# Patient Record
Sex: Male | Born: 1950
Health system: Southern US, Community
[De-identification: ages and names within clinical notes are randomized; demographics above are authoritative.]

## PROBLEM LIST (undated history)

## (undated) DIAGNOSIS — E66812 Obesity, class 2: Secondary | ICD-10-CM

## (undated) DIAGNOSIS — I2510 Atherosclerotic heart disease of native coronary artery without angina pectoris: Secondary | ICD-10-CM

## (undated) DIAGNOSIS — I10 Essential (primary) hypertension: Secondary | ICD-10-CM

## (undated) DIAGNOSIS — K5790 Diverticulosis of intestine, part unspecified, without perforation or abscess without bleeding: Secondary | ICD-10-CM

## (undated) DIAGNOSIS — E669 Obesity, unspecified: Secondary | ICD-10-CM

## (undated) DIAGNOSIS — D126 Benign neoplasm of colon, unspecified: Secondary | ICD-10-CM

## (undated) DIAGNOSIS — C61 Malignant neoplasm of prostate: Secondary | ICD-10-CM

## (undated) DIAGNOSIS — M1990 Unspecified osteoarthritis, unspecified site: Secondary | ICD-10-CM

## (undated) DIAGNOSIS — M797 Fibromyalgia: Secondary | ICD-10-CM

## (undated) DIAGNOSIS — H269 Unspecified cataract: Secondary | ICD-10-CM

## (undated) DIAGNOSIS — E785 Hyperlipidemia, unspecified: Secondary | ICD-10-CM

## (undated) DIAGNOSIS — K648 Other hemorrhoids: Secondary | ICD-10-CM

## (undated) DIAGNOSIS — K219 Gastro-esophageal reflux disease without esophagitis: Secondary | ICD-10-CM

## (undated) DIAGNOSIS — F419 Anxiety disorder, unspecified: Secondary | ICD-10-CM

## (undated) DIAGNOSIS — R011 Cardiac murmur, unspecified: Secondary | ICD-10-CM

## (undated) DIAGNOSIS — Z9889 Other specified postprocedural states: Secondary | ICD-10-CM

## (undated) DIAGNOSIS — Z9861 Coronary angioplasty status: Secondary | ICD-10-CM

## (undated) DIAGNOSIS — R112 Nausea with vomiting, unspecified: Secondary | ICD-10-CM

## (undated) DIAGNOSIS — E119 Type 2 diabetes mellitus without complications: Secondary | ICD-10-CM

## (undated) HISTORY — DX: Coronary angioplasty status: Z98.61

## (undated) HISTORY — PX: HEEL SPUR SURGERY: SHX665

## (undated) HISTORY — DX: Other hemorrhoids: K64.8

## (undated) HISTORY — DX: Gastro-esophageal reflux disease without esophagitis: K21.9

## (undated) HISTORY — DX: Essential (primary) hypertension: I10

## (undated) HISTORY — DX: Diverticulosis of intestine, part unspecified, without perforation or abscess without bleeding: K57.90

## (undated) HISTORY — DX: Obesity, class 2: E66.812

## (undated) HISTORY — DX: Hyperlipidemia, unspecified: E78.5

## (undated) HISTORY — PX: POLYPECTOMY: SHX149

## (undated) HISTORY — DX: Atherosclerotic heart disease of native coronary artery without angina pectoris: I25.10

## (undated) HISTORY — PX: HEMORRHOID SURGERY: SHX153

## (undated) HISTORY — DX: Anxiety disorder, unspecified: F41.9

## (undated) HISTORY — PX: COLONOSCOPY: SHX174

## (undated) HISTORY — DX: Cardiac murmur, unspecified: R01.1

## (undated) HISTORY — DX: Unspecified cataract: H26.9

## (undated) HISTORY — DX: Obesity, unspecified: E66.9

## (undated) HISTORY — PX: FOOT SURGERY: SHX648

## (undated) HISTORY — DX: Unspecified osteoarthritis, unspecified site: M19.90

## (undated) HISTORY — DX: Benign neoplasm of colon, unspecified: D12.6

## (undated) HISTORY — PX: JOINT REPLACEMENT: SHX530

## (undated) HISTORY — PX: TOTAL HIP ARTHROPLASTY: SHX124

## (undated) HISTORY — DX: Fibromyalgia: M79.7

## (undated) HISTORY — PX: OTHER SURGICAL HISTORY: SHX169

---

## 2002-05-27 ENCOUNTER — Encounter: Payer: Self-pay | Admitting: Family Medicine

## 2002-05-27 ENCOUNTER — Encounter: Payer: Self-pay | Admitting: Emergency Medicine

## 2002-05-27 ENCOUNTER — Encounter: Payer: Self-pay | Admitting: Cardiology

## 2002-05-27 ENCOUNTER — Inpatient Hospital Stay (HOSPITAL_COMMUNITY): Admission: EM | Admit: 2002-05-27 | Discharge: 2002-05-28 | Payer: Self-pay | Admitting: Emergency Medicine

## 2002-05-27 HISTORY — PX: DOPPLER ECHOCARDIOGRAPHY: SHX263

## 2002-05-28 ENCOUNTER — Encounter: Payer: Self-pay | Admitting: Internal Medicine

## 2002-07-05 ENCOUNTER — Encounter: Admission: RE | Admit: 2002-07-05 | Discharge: 2002-07-05 | Payer: Self-pay | Admitting: Infectious Diseases

## 2004-08-13 ENCOUNTER — Ambulatory Visit: Payer: Self-pay | Admitting: Family Medicine

## 2004-12-19 ENCOUNTER — Ambulatory Visit: Payer: Self-pay | Admitting: Family Medicine

## 2005-03-05 ENCOUNTER — Ambulatory Visit: Payer: Self-pay | Admitting: Family Medicine

## 2005-03-12 ENCOUNTER — Ambulatory Visit: Payer: Self-pay | Admitting: Family Medicine

## 2005-03-28 ENCOUNTER — Ambulatory Visit: Payer: Self-pay | Admitting: Gastroenterology

## 2005-03-29 DIAGNOSIS — D126 Benign neoplasm of colon, unspecified: Secondary | ICD-10-CM

## 2005-03-29 HISTORY — DX: Benign neoplasm of colon, unspecified: D12.6

## 2005-04-04 ENCOUNTER — Ambulatory Visit: Payer: Self-pay | Admitting: Family Medicine

## 2005-04-19 ENCOUNTER — Encounter (INDEPENDENT_AMBULATORY_CARE_PROVIDER_SITE_OTHER): Payer: Self-pay | Admitting: Specialist

## 2005-04-19 ENCOUNTER — Ambulatory Visit: Payer: Self-pay | Admitting: Gastroenterology

## 2005-05-23 ENCOUNTER — Ambulatory Visit (HOSPITAL_COMMUNITY): Admission: RE | Admit: 2005-05-23 | Discharge: 2005-05-23 | Payer: Self-pay | Admitting: Orthopedic Surgery

## 2005-07-02 ENCOUNTER — Ambulatory Visit: Payer: Self-pay | Admitting: Family Medicine

## 2005-08-06 ENCOUNTER — Ambulatory Visit: Payer: Self-pay | Admitting: Family Medicine

## 2005-08-07 ENCOUNTER — Encounter: Admission: RE | Admit: 2005-08-07 | Discharge: 2005-08-07 | Payer: Self-pay | Admitting: Family Medicine

## 2006-04-22 ENCOUNTER — Ambulatory Visit: Payer: Self-pay | Admitting: Family Medicine

## 2006-05-09 ENCOUNTER — Ambulatory Visit: Payer: Self-pay | Admitting: Family Medicine

## 2006-07-29 DIAGNOSIS — Z9861 Coronary angioplasty status: Secondary | ICD-10-CM

## 2006-07-29 DIAGNOSIS — I2510 Atherosclerotic heart disease of native coronary artery without angina pectoris: Secondary | ICD-10-CM

## 2006-07-29 HISTORY — DX: Atherosclerotic heart disease of native coronary artery without angina pectoris: I25.10

## 2006-07-29 HISTORY — DX: Atherosclerotic heart disease of native coronary artery without angina pectoris: Z98.61

## 2006-10-27 ENCOUNTER — Inpatient Hospital Stay (HOSPITAL_COMMUNITY): Admission: RE | Admit: 2006-10-27 | Discharge: 2006-10-30 | Payer: Self-pay | Admitting: Orthopedic Surgery

## 2006-11-02 ENCOUNTER — Emergency Department (HOSPITAL_COMMUNITY): Admission: EM | Admit: 2006-11-02 | Discharge: 2006-11-02 | Payer: Self-pay | Admitting: Emergency Medicine

## 2006-11-03 ENCOUNTER — Emergency Department (HOSPITAL_COMMUNITY): Admission: EM | Admit: 2006-11-03 | Discharge: 2006-11-04 | Payer: Self-pay | Admitting: Emergency Medicine

## 2007-02-24 ENCOUNTER — Ambulatory Visit: Payer: Self-pay | Admitting: Family Medicine

## 2007-02-24 DIAGNOSIS — M159 Polyosteoarthritis, unspecified: Secondary | ICD-10-CM | POA: Insufficient documentation

## 2007-02-24 DIAGNOSIS — F411 Generalized anxiety disorder: Secondary | ICD-10-CM | POA: Insufficient documentation

## 2007-02-24 DIAGNOSIS — I10 Essential (primary) hypertension: Secondary | ICD-10-CM

## 2007-02-24 DIAGNOSIS — I152 Hypertension secondary to endocrine disorders: Secondary | ICD-10-CM | POA: Insufficient documentation

## 2007-02-24 DIAGNOSIS — K219 Gastro-esophageal reflux disease without esophagitis: Secondary | ICD-10-CM

## 2007-02-24 DIAGNOSIS — R079 Chest pain, unspecified: Secondary | ICD-10-CM

## 2007-02-24 DIAGNOSIS — F418 Other specified anxiety disorders: Secondary | ICD-10-CM | POA: Insufficient documentation

## 2007-02-26 ENCOUNTER — Ambulatory Visit: Payer: Self-pay | Admitting: Internal Medicine

## 2007-02-26 ENCOUNTER — Emergency Department (HOSPITAL_COMMUNITY): Admission: EM | Admit: 2007-02-26 | Discharge: 2007-02-26 | Payer: Self-pay | Admitting: Emergency Medicine

## 2007-03-03 ENCOUNTER — Telehealth: Payer: Self-pay | Admitting: Family Medicine

## 2007-03-04 ENCOUNTER — Telehealth: Payer: Self-pay | Admitting: Family Medicine

## 2007-04-07 HISTORY — PX: OTHER SURGICAL HISTORY: SHX169

## 2007-04-08 HISTORY — PX: OTHER SURGICAL HISTORY: SHX169

## 2007-04-30 ENCOUNTER — Ambulatory Visit (HOSPITAL_COMMUNITY): Admission: RE | Admit: 2007-04-30 | Discharge: 2007-05-01 | Payer: Self-pay | Admitting: *Deleted

## 2007-04-30 HISTORY — PX: CORONARY ANGIOPLASTY WITH STENT PLACEMENT: SHX49

## 2007-10-13 ENCOUNTER — Telehealth (INDEPENDENT_AMBULATORY_CARE_PROVIDER_SITE_OTHER): Payer: Self-pay | Admitting: *Deleted

## 2010-03-09 ENCOUNTER — Encounter (INDEPENDENT_AMBULATORY_CARE_PROVIDER_SITE_OTHER): Payer: Self-pay | Admitting: *Deleted

## 2010-08-19 ENCOUNTER — Encounter: Payer: Self-pay | Admitting: Family Medicine

## 2010-08-28 NOTE — Letter (Signed)
Summary: Colonoscopy Letter  [facility]  [address] [place], [place] [address]   Phone: [phone]  Fax: [phone]      March 09, 2010 MRN: [id number]   Samuel Chambers [address] [place], [place]  [address]   Dear Mr. LEITZEL,   According to your medical record, it is time for you to schedule a Colonoscopy. The American Cancer Society recommends this procedure as a method to detect early colon cancer. Patients with a family history of colon cancer, or a personal history of colon polyps or inflammatory bowel disease are at increased risk.  This letter has beeen generated based on the recommendations made at the time of your procedure. If you feel that in your particular situation this may no longer apply, please contact our office.  Please call our office at [phone] to schedule this appointment or to update your records at your earliest convenience.  Thank you for cooperating with [place] to provide you with the very best care possible.   Sincerely,  Judie Petit T. Russella Dar, M.D.  [facility] [phone]

## 2010-12-11 NOTE — Consult Note (Signed)
NAME:  Samuel Chambers, Samuel Chambers NO.:  [id number]   MEDICAL RECORD NO.:  [id number]          PATIENT TYPE:  EMS   LOCATION:  MAJO                         FACILITY:  [facility]   PHYSICIAN:  Raenette Rover. Felicity Coyer, MDDATE OF BIRTH:  05-Sep-1950   DATE OF CONSULTATION:  02/26/2007  DATE OF DISCHARGE:  02/26/2007                                 CONSULTATION   REQUESTING PHYSICIAN:  ER doctor, Quita Skye, MD.   CHIEF COMPLAINT:  Dizziness with chest tightness.   HISTORY OF PRESENT ILLNESS:  The patient is a 60 year old white  gentleman with history of orthopedic problems including tendinitis and  arthritis, status post left total hip replacement arthroplasty in March  2008, who presents to [facility] today for multiple  complaints.  He has had chest tightness with radiation into the left arm  associated with diaphoresis for the last 2 weeks.  He attributes the  onset of these symptoms to be beginning prednisone taper prescribed by  Dr. Lequita Halt for hip bursitis.  He took these medications over the course  of the prescribed week with relative resolution of his hip symptoms, but  has experienced onset of high blood pressure with the prednisone use.  He has had diagnosis of borderline hypertension prior to this time and  on evaluation by his primary care physician, Dr. Shellia Carwin, on Tuesday,  when his blood pressure was found to be elevated, he was given a  prescription for clonidine 0.3 mg b.i.d.  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  Friends at work noted his skin to be flushed as if  sunburned.  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  He had blurring of  vision, unable to read, seeing double, called his wife and thereby came  to the emergency room for evaluation.  At this time in the emergency  room, he noted to be normo to slightly hypotensive and reports that he  feels he has been run over by a truck, but denies specific change in his  chest pain and no dizziness at rest, no nausea or vomiting.   PAST MEDICAL HISTORY:  1. Borderline hypertension.  2. Question of dyslipidemia (the patient reports his cholesterol was      130 and has not been on cholesterol medications due to adverse side-      effects).  3. He is status post total left hip replacement in March of this year.  4. He has a history of reflux for which he takes p.r.n. treatment.  5. History of anxiety.   MEDICATIONS:  1. Clonidine 0.3 mg b.i.d. for the last 48 hours.  2. Nexium 40 mg daily p.r.n.  3. Percocet 5 mg p.r.n.  4. Celebrex 100 mg p.o. daily, first dose this morning.  5. Aspirin 81 mg daily for the last 2 days, otherwise p.r.n.  6. Robaxin 500 mg p.r.n.  7. Ibuprofen 800 mg p.r.n.  8. Aleve p.r.n.   ALLERGIES:  Intolerance to CODEINE and COUMADIN.   FAMILY HISTORY:  His mother died at age 38 due to dementia and she did  have diet-controlled diabetes.  His father is still living at age 45,  but has had history of lung cancer and prostate trouble.  He reports  that a brother had a bypass surgery in his 60s, but did not take care of  himself and had uncontrolled diabetes.   SOCIAL HISTORY:  He works as a Curator.  He is married.  He does not  smoke, has rare alcohol.   REVIEW OF SYSTEMS:  Negative for fever or chills.  No shortness of  breath or lower extremity swelling.  No pleurisy.  No headache.  No neck  tightness.  No nausea or vomiting with the increase in reflux symptoms.  No abdominal pain.   PHYSICAL EXAM:  VITAL SIGNS:   Temperature of 98.5, blood pressure  112/75, as low as 90 systolic in the emergency room, pulse of 70, normal  sinus on the monitor without arrhythmias, respiratory rate 20,  saturating 96% on room air.  GENERAL:  He is a slightly overweight, but pleasant and minimally  anxious man in no acute distress.  His wife is at bedside.  He is awake,  alert and oriented x4 without gross deficits.  HEENT:  Normocephalic, atraumatic.  PERRL.  EOMI.  Oropharynx clear.  NECK:  Supple, but thick.  No appreciable JVD.  LUNGS:  Clear to auscultation bilaterally anteriorly without wheeze or  crackle.  CARDIOVASCULAR:  Regular rate and rhythm with a sharp S1 and S2, no  murmurs, rubs or gallops appreciated.  ABDOMEN:  Protuberant, but soft and nontender with good bowel sounds.  EXTREMITIES:  Show no edema or swelling.  NEUROLOGIC:  Cranial nerves II-XII are symmetrically intact and there  are no visual field deficits.   LABORATORY DATA:  Unremarkable with a normal CBC, normal basic metabolic  and point-of-care enzymes negative x1.   EKG is normal sinus rhythm at 59 beats per minute.   Chest x-ray shows minimal cardiomegaly changes with vascular congestion,  but no edema, no effusion, airspace disease or consolidation.   ASSESSMENT AND PLAN:  1. Dizziness due to symptomatic hypotension with orthostatic-type      changes likely due to clonidine effect.  Note:  He has taken a high      dose of this medications for the last 2 days with concurrent      symptom onset.  There is no evidence of acute coronary syndrome or      arrhythmia on the emergency room evaluation.  I have recommended an      overnight observation to the patient and his wife to monitor on      telemetry, rule out cardiac enzymes and continue to hold clonidine      to allow its effects to subside.  However, after review, the      patient will prefer discharge home to minimize exertion on his own      in the next 24 hours and will stop  clonidine for himself.  He and      his wife agree to return to the emergency room if he has increase  in his dizziness symptoms or chest pain, or other problems.  I have      recommended outpatient followup with his primary medical doctor for      a blood pressure recheck, a cholesterol check and an outpatient      stress test in the next 3-4 weeks.  He and the patient's wife have      reviewed these instructions and agreed to plan as described.  It is      notable that he has few cardiac risk, since he is not a smoker, has      no clear family history, no personal history of diabetes and      questionable history of dyslipidemia as well as only borderline      hypertension prior to this time.  2. Steroid-induced hypertension.  Now with symptomatic hypotension on      clonidine treatment.  He reports an intolerance to previous      antihypertensive medications and has only had a history of      borderline hypertension in the past with systolic pressures      ranging 140-150 by his report; this was all prior to prednisone and      his prednisone is now decreased and would expect the prednisone      effects to be minimized in the next several days to weeks.  I have      recommended to the patient that he needs close followup with his      primary medical after this effect of the steroids is gone to      reevaluate blood pressure and consider other treatment if this      remains elevated to minimize other atherosclerotic disease effects.  3. Status post left total hip replacement in March 2008.  He is also      status post a steroid taper for tendinitis in the same.  Outpatient      followup with Dr. Lequita Halt, his orthopedist, and then he is welcome      to continue Celebrex as needed for his symptoms.  4. History of gastroesophageal reflux disease symptoms.  Increased      symptoms with recent steroid use.  Would continue Nexium or other      over-the-counter proton pump inhibitor as  needed and follow up with      primary medical doctor once established.   In conclusion, I feel it is safe for discharge home for continued  outpatient observation.      Valerie A. Felicity Coyer, MD  Electronically Signed     VAL/MEDQ  D:  02/26/2007  T:  02/27/2007  Job:  [id number]

## 2010-12-11 NOTE — Discharge Summary (Signed)
NAME:  Samuel Chambers, Samuel Chambers NO.:  [id number]   MEDICAL RECORD NO.:  [id number]          PATIENT TYPE:  OIB   LOCATION:  6524                         FACILITY:  [facility]   PHYSICIAN:  Darlin Priestly, MD  DATE OF BIRTH:  [birth year]   DATE OF ADMISSION:  04/30/2007  DATE OF DISCHARGE:  05/01/2007                               DISCHARGE SUMMARY   DISCHARGE DIAGNOSES:  1. Unstable angina, catheterization and PROMUS circumflex stenting      this admission.  2. Treated hypertension.  3. Dyslipidemia.   HOSPITAL COURSE:  The patient is a 60 year old male followed by Dr.  Lajean Manes.  He has hypertension and dyslipidemia.  He had a hip  replacement on October 27, 2006.  After that, he had some exertional  dyspnea and some chest tightness and unstable blood pressure.  He had  some tachycardia and a monitor was placed as an outpatient.  He had  sinus rhythm with sinus tachycardia and PVCs.  He has had a remote  catheterization which reportedly was normal.  Initially, stress test was  ordered, although I do not have the report that this was done.  He was  admitted for catheterization on April 30, 2007.  This revealed 60-70%  mid RCA, 95% circumflex after the OM-1 and a normal LAD.  He underwent  PROMUS stenting to the circumflex with good results.  His enzymes were  negative.  We feel he can be discharged on May 01, 2007.   DISCHARGE MEDICATIONS:  1. Avapro 150 mg a day.  2. Nexium 40 mg a day.  3. Metoprolol 100 mg a day.  4. Aspirin 81 mg a day.  5. Plavix 75 mg a day.  6. Zocor 40 mg a day.  7. Nitroglycerin sublingual p.r.n.   LABORATORY DATA AND X-RAY FINDINGS:  Sodium 139, potassium 4.1, BUN 10,  creatinine 1.  CK-MB and troponin are negative.  White count 7.1,  hemoglobin 13.3, hematocrit 38.9, platelets 220.   CONDITION ON DISCHARGE:  The patient is discharged in stable condition.   FOLLOW UP:  He will follow up with Dr. Jenne Campus in a couple weeks.      Abelino Derrick, P.A.      Darlin Priestly, MD  Electronically Signed    LKK/MEDQ  D:  05/01/2007  T:  05/01/2007  Job:  [id number]

## 2010-12-11 NOTE — Discharge Summary (Signed)
NAME:  Samuel Chambers, COATS NO.:  [id number]   MEDICAL RECORD NO.:  [id number]          PATIENT TYPE:  OIB   LOCATION:  6524                         FACILITY:  [facility]   PHYSICIAN:  Darlin Priestly, MD  DATE OF BIRTH:  [birth year]   DATE OF ADMISSION:  04/30/2007  DATE OF DISCHARGE:  05/01/2007                               DISCHARGE SUMMARY   ADDENDUM:  Mr. Lesch apparently has had myalgias with Zocor in the  past.  We changed him to Crestor at discharge.      Abelino Derrick, P.A.      Darlin Priestly, MD  Electronically Signed    LKK/MEDQ  D:  05/01/2007  T:  05/01/2007  Job:  [id number]

## 2010-12-11 NOTE — Cardiovascular Report (Signed)
NAME:  CHRISTOPHERJOHN, SCHIELE NO.:  [id number]   MEDICAL RECORD NO.:  [id number]          PATIENT TYPE:  OIB   LOCATION:  6524                         FACILITY:  [facility]   PHYSICIAN:  Darlin Priestly, MD  DATE OF BIRTH:  17-Aug-1950   DATE OF PROCEDURE:  04/30/2007  DATE OF DISCHARGE:                            CARDIAC CATHETERIZATION   PROCEDURES PERFORMED:  1. Left heart catheterization.  2. Coronary angiography.  3. Left ventriculogram.  4. Abdominal aortogram.  5. PROMUS stent, left circumflex - mid  6. Placement of intracoronary stent.   CARDIOLOGIST:  Darlin Priestly, M.D.   COMPLICATIONS:  None.   INDICATIONS:  The patient is a 60 year old male, patient of Dr. Lajean Manes, with a history of hypertension and hyperlipidemia who recently  underwent left hip replacement.  Since that time his has complained of  increasing shortness of breath and chest tightness with fluctuating  blood pressures.  After a lengthy discussion with both he and his wife  we have opted for cardiac catheterization to rule out CAD.   DESCRIPTION OF THE PROCEDURE:  After obtaining written informed consent  the patient was brought to the cardiac cath lab.  The right and left  groins were shaved, prepped and draped in the usual sterile fashion.  Usual monitoring was established.  Using the modified Seldinger  technique a #6-French arterial sheath was placed in the right femoral  artery.  Six French diagnostic catheters performed diagnostic  angiography.   RESULTS:   ANGIOGRAPHIC DATA:  Left Main:  The left main is a large long vessel  with no significant disease.   LAD:  The LAD is a large vessel that courses the apex with one diagonal  branch.  The LAD has no significant disease.  The first diagonal is a  medium size vessel, which bifurcates distally with no significant  disease.   Circumflex:  The left circumflex is a medium size vessel coursing the AV  groove and gives rise  to two obtuse marginal branches.  The AV groove  circumflex is tortuous and has proximal stenosis coming off at 90  degrees ostial from the left main.  There is a 95% stenotic lesion  between the first and second OM.  The first OM is a medium size vessel,  which bifurcates distally with no significant disease.  The second OM is  a small-to-medium size vessel with no significant disease.   Right Coronary Artery:  The right coronary is a large vessel, which is  dominant and give rise to both the PDA as well as the posterolateral  branch.  There is a 60-70% midvessel stenotic lesion as well as 30%  distal RCA disease.  The PDA and the posterolateral branches are small  vessels with no significant disease.   VENTRICULOGRAPHIC DATA:  Ventriculogram:  The left ventriculogram  reveals a preserved EF of 60%.   Aortographic data:  Aortogram:  The abdominal aortogram reveals no significant renal artery  stenosis.   HEMODYNAMIC RESULTS:  Systemic arterial pressure 143/84, LV systemic  pressure 145/7 and LVEDP 17.   INTERVENTIONAL  PROCEDURE:  Left circumflex-mid:  Following diagnostic  angiography a 6-French XP 3.0 guiding catheter with side holes was used  to engage the left main.  Next a 0.014 Forte marker wire was advanced  out the guiding catheter, used to cross the mid AV groove stenotic  lesion positioned in the distal second OM.  We then took a PROMUS 2.58  mm stent and we were able to successfully cross the stenotic lesion.  The PROMUS stent was then deployed to eight atmospheres for a total 19  seconds.  A second inflation to eight atmospheres was performed for a  total of 22 seconds.  Follow up angiogram revealed excellent luminal  gain with no evidence of dissection or thrombus.  This stent balloon was  removed and a 2.5 x 8 mm Quantum Maverick was then positioned within the  previously placed stent.  One inflation to 12 atmospheres was performed  for a total of 20 seconds.  Follow  up angiogram revealed no evidence of  dissection or thrombus with TIMI III flow in the distal vessel.  Intravenous Angiomax  was used throughout the case.   Final orthogonal angiograms revealed less than 10 residual stenosis in  the AV groove circumflex stenotic occlusion with TIMI III flow  through  the distal vessel.   At this point we elected to conclude the procedure.  All balloons, wires  and catheters were removed.  Hemostatic sheaths were sewn in place and  the patient was transferred back to ward in stable condition.   CONCLUSION:  1. Successful placement of a PROMUS 2.58 mm stent in the mid AV groove      stenotic lesion.  2. Moderate right coronary artery disease.  3. Normal left ventricular systolic function.  4. No evidence for an renal artery stenosis.  5. Adjuvant use of Angiomax infusion.      Darlin Priestly, MD  Electronically Signed     RHM/MEDQ  D:  04/30/2007  T:  05/01/2007  Job:  [id number]   cc:   Oley Balm. Georgina Pillion, M.D.

## 2010-12-14 NOTE — Discharge Summary (Signed)
NAME:  Samuel Chambers, Samuel Chambers NO.:  [id number]   MEDICAL RECORD NO.:  [id number]                   PATIENT TYPE:  INP   LOCATION:  4703                                 FACILITY:  [facility]   PHYSICIAN:  Charlton Haws, M.D. [facility]              DATE OF BIRTH:  1950/10/05   DATE OF ADMISSION:  05/27/2002  DATE OF DISCHARGE:  05/28/2002                           DISCHARGE SUMMARY - REFERRING   DISCHARGE DIAGNOSES:  1. Chest pain.  2. Dyspnea on exertion.  3. Hypertension.   HISTORY OF PRESENT ILLNESS:  The patient is a 60 year old male patient who  was admitted to [facility] on May 27, 2002 with substernal chest  pain.  He had noticed cold symptoms that were threatening him again in  August of 2003 and persisted for two months.  Essentially he states that no  diagnosis was found; however, since that time he has complained of fatigue  and dyspnea on exertion which has worsened over the past week.  On admission  he described anterior substernal chest pain radiating through his back that  he describes as a knife piercing into his back.  This was worse this  morning and he came to the emergency room.  Due to the description he  underwent a chest CT to evaluate for dissection.  This essentially was  negative.  He was admitted and lab studies during his hospital stay reveal a  total CK of 415 with 4.7 MB fractions.  Troponins are negative.  His BMET  was normal.  Total cholesterol was 220, HDL 49, LDL 146.  Triglycerides 123.  CBC was normal.  D-dimer 0.30.  He was hospitalized overnight and remained  in stable condition.  He did undergo a stress Cardiolite and this was  negative for ischemia or perfusion defect, EF of 67%.  A 2-D echo revealed  normal LV systolic function with an EF of 55-65% with no wall motion  abnormalities.  For these reasons, the patient was discharged home in stable  condition on the following medicines:   MEDICATIONS:  1. Enteric-coated  aspirin 325 mg one p.o. q.d.  2. Lopressor 50 mg one half tablet twice a day.  3. Tylenol p.r.n.   ACTIVITY:  As tolerated.   DIET:  Low fat diet.    FOLLOW UP:  Call for questions or concerns.  He has a follow-up appointment  with Dr. Cato Mulligan for follow-up of his blood pressure and internal medicine  issues on June 11, 2002, 11 a.m.        Guy Franco, P.A. [facility]                      Charlton Haws, M.D. [facility]    LB/MEDQ  D:  06/19/2002  T:  06/19/2002  Job:  [id number]   cc:   Tinnie Gens A. Tawanna Cooler, M.D. [facility]   Charlies Constable, M.D. [facility]  [address]  [place]  [place] [address]

## 2010-12-14 NOTE — Discharge Summary (Signed)
NAME:  Samuel Chambers, Samuel Chambers NO.:  [id number]   MEDICAL RECORD NO.:  [id number]          PATIENT TYPE:  INP   LOCATION:  1503                         FACILITY:  [facility]   PHYSICIAN:  Ollen Gross, M.D.    DATE OF BIRTH:  1950-12-19   DATE OF ADMISSION:  10/27/2006  DATE OF DISCHARGE:  10/30/2006                               DISCHARGE SUMMARY   ADMITTING DIAGNOSES:  1. Osteoarthritis, left hip.  2. Mild anxiety.  3. Borderline hypertension.  4. Reflux disease.  5. Hemorrhoids.   DISCHARGE DIAGNOSIS:  1. Osteoarthritis, left hip, status post left total hip replacement      arthroplasty.  2. Mild postoperative blood loss anemia.  3. Mild anxiety.  4. Borderline hypertension.  5. Reflux disease.  6. Hemorrhoids.   PROCEDURE:  October 27, 2006, left total hip.  Surgeon: Dr. Lequita Halt.  Assistant:  Avel Peace PA-C.  Anesthesia: General.   CONSULTATIONS:  None.   BRIEF HISTORY:  The patient is a 60 year old male with a rapidly  progressive OA of the left hip, developed intractable pain with  diminished function ever since a total hip arthroplasty.   LABORATORY DATA:  Preop CBC showed hemoglobin of 14.9, hematocrit 42.7,  white cell count 8.5.  Postoperative hemoglobin 11.8 drifted down to  10.3.  Last hemoglobin and hematocrit back September were 10.7 and 31.0,  respectively.  PT/PTT preop 12.7 and 29, respectively.  INR 0.9.  Serial  pro times followed.  Last PT/INR 20.6 and 1.7, respectively.  Chemistry  panel on admission all within normal limits.  Serial BMETs were  followed.  Electrolytes remained within normal limits.  Preop UA  negative.  Blood group type B positive.   X-rays: Pelvis hip film October 21, 2006:  Bilateral degenerative changes  of hips, left greater than right.   Portable pelvis and hip film October 27, 2006:  Left total hip without  complication.   HOSPITAL COURSE:  The patient was admitted to [facility],  tolerated procedure well,  later transferred to the recovery room and  then the orthopedic floor.  Started on PCA and p.o. analgesics for pain  control following surgery.  Did pretty well on the evening of surgery  and on morning of day #1 started getting up out of bed. Hemovac drain  was pulled without difficulty, had excellent urinary output.  By day #2,  was progressing well with PT and got up and walked about 150 feet.  Dressing was changed.  Incision looked good.  Hemoglobin down to 10.3  but was stable and no signs of anemia. Asymptomatic with this. Would  continue with his physical therapy. Progressed very well.  Was ready to  go home by the following day of October 30, 2006.  He had been weaned over  to p.o. medications and was discharged home.   DISCHARGE/PLAN:  1. The patient discharged home on October 30, 2006.  2. For Discharge Diagnoses, please see above.  3. Discharge medications:  Coumadin, Percocet, Robaxin.  4. Diet:  Resume home diet.  5. Follow up in 2 weeks.  6.  Activity:  Partial weightbearing left lower extremity.  7. Home health PT, home health nursing, total hip protocol hip      precautions.   DISPOSITION:  Home.   CONDITION ON DISCHARGE:  Improved.      Samuel Chambers, P.A.      Ollen Gross, M.D.  Electronically Signed    ALP/MEDQ  D:  11/14/2006  T:  11/14/2006  Job:  [id number]   cc:   Jeannett Senior A. Clent Ridges, MD  [address] [place]  [place] [address]

## 2010-12-14 NOTE — Op Note (Signed)
NAME:  Samuel Chambers, PORTAL NO.:  [id number]   MEDICAL RECORD NO.:  [id number]          PATIENT TYPE:  INP   LOCATION:  0002                         FACILITY:  [facility]   PHYSICIAN:  Ollen Gross, M.D.    DATE OF BIRTH:  1950-12-19   DATE OF PROCEDURE:  10/27/2006  DATE OF DISCHARGE:                               OPERATIVE REPORT   PREOPERATIVE DIAGNOSIS:  Osteoarthritis left hip.   POSTOPERATIVE DIAGNOSIS:  Osteoarthritis left hip.   PROCEDURE:  Left total hip arthroplasty.   SURGEON:  Ollen Gross, M.D.   ASSISTANT:  Avel Peace PA-C   ANESTHESIA:  General.   ESTIMATED BLOOD LOSS:  500.   DRAIN:  Hemovac x1.   COMPLICATIONS:  None.   CLINICAL NOTE:  The patient is a 60 year old male who has had rapidly  progressive osteoarthritis in the left hip.  He has developed  intractable pain with diminished function.  He presents now for total  hip arthroplasty.   PROCEDURE IN DETAIL:  After successful administration of general  anesthetic, the patient is placed a right lateral decubitus position  with the left side up and held with the hip positioner.  The left lower  extremity was isolated from his perineum with plastic drapes and prepped  and draped in the usual sterile fashion.  A short posterolateral  incision was made with a 10 blade through subcutaneous tissue to the  level of the fascia lata which was incised in line with the skin  incision.  The sciatic nerve was palpated and protected and the short  external rotators isolated off the femur.  Capsulectomy was performed  and the hip was dislocated.  The center of the femoral head is marked  and the trial prosthesis is placed such that the center of the trial  head corresponds to the center of his native femoral head.  Osteotomy  lines are marked on the femoral neck and osteotomy made with an  oscillating saw.  Femoral head is removed and then femur retracted  anteriorly to gain acetabular exposure.  An  acetabular retractor is  placed, the labrum and osteophytes removed.  Reaming starts at 45 mm,  coursing in increments of 2, up to 55 mm and a 56 mm pinnacle acetabular  shell was placed in anatomic position with excellent purchase.  Did not  place any supplemental screws.  The apex hole eliminator was placed and  then the 40 mm neutral Ultamet metal liner was placed for metal on metal  hip replacement.   The femur was prepared with the canal finder and irrigation.  Axial  reaming was performed up to 13.5 mm proximal reaming to an 18 D and the  sleeve was to a small.  An 23 D small trial sleeve was placed with an 18  x 13 stem and a 36 + 8 neck, about 10 degrees beyond his native  anteversion.  A 40 plus 0 head was placed.  Hip was reduced, great  stability, full extension, full external rotation 70 degrees of flexion,  40 degrees adduction, and 70 degrees internal rotation and  90 degrees of  flexion, 70 degrees internal rotation.  By placing the left leg on top  of the right, it felt as though the leg lengths were equal.  The hip was  then dislocated and the trial femoral components removed.  The permanent  18 D small sleeve was placed with an 18 x 13 stem, and 36 + 8 neck, 10  degrees beyond native anteversion.  A 40.0 head was placed, the hips  reduced with the same stability parameters.  The wound was copiously  irrigated with saline solution and the short rotators reattached to the  femur through drill holes.  The fascia lata was closed over Hemovac  drain with interrupted #1 Vicryl,  subcutaneous closed with #1-0 and #2-  0 Vicryl, and subcuticular running 4-0 Monocryl.  The drain was hooked  to suction.  The incision was clean and dry, and Steri-Strips and bulky  sterile dressing applied.  He was then placed into a knee immobilizer,  awakened, and transported to recovery in stable condition.      Ollen Gross, M.D.  Electronically Signed     FA/MEDQ  D:  10/27/2006  T:   10/27/2006  Job:  [id number]

## 2010-12-14 NOTE — H&P (Signed)
NAME:  Samuel Chambers, STUDLEY NO.:  [id number]   MEDICAL RECORD NO.:  [id number]          PATIENT TYPE:  INP   LOCATION:  NA                           FACILITY:  [facility]   PHYSICIAN:  Samuel Chambers, M.D.    DATE OF BIRTH:  May 03, 1951   DATE OF ADMISSION:  10/27/2006  DATE OF DISCHARGE:                              HISTORY & PHYSICAL   DATE OF OFFICE VISIT HISTORY AND PHYSICAL:  October 14, 2006.   CHIEF COMPLAINT:  Left hip pain.   HISTORY OF PRESENT ILLNESS:  The patient is a 60 year old male who has  been seen by Dr. Lequita Chambers for ongoing pain.  He has been seen for knee  and hip pain.  He has undergone cortisone injection for the knee, but,  unfortunately, his left hip has progressively gotten worse since the  past year or so.  He came in after several weeks of increased pain.  He  was seen and found to have bone-on-bone arthritis in the left hip with  loss of joint space and progressive symptoms.  It has felt he has  reached a point where he could benefit from undergoing hip replacement.  Risks and benefits have been discussed, and he elects to proceed with  surgery.   ALLERGIES:  NO KNOWN DRUG ALLERGIES.   INTOLERANCES:  CODEINE (the patient is able and currently taking  Hydrocodone).   CURRENT MEDICATIONS:  1. Lexapro.  2. Hydrocodone.  3. Nexium.   PAST MEDICAL HISTORY:  1. Mild anxiety.  2. Borderline hypertension.  3. Reflux disease.  4. Hemorrhoids.  5. Arthritis.   PAST SURGICAL HISTORY:  1. Right foot surgery in 1994.  2. Hemorrhoid surgery.  3. Colonoscopy with polypectomy.   SOCIAL HISTORY:  Married.  Works as a Curator.  Nonsmoker.  No alcohol.  One child.   FAMILY HISTORY:  Hypertension, diabetes and breast and lung cancer.  Also stroke and arthritis.   REVIEW OF SYSTEMS:  GENERAL:  No fevers, chills, night sweats.  NEUROLOGIC:  No seizures, syncope or paralysis.  RESPIRATORY:  No  shortness of breath, productive cough or hemoptysis.   CARDIOVASCULAR:  No chest pain, angina or orthopnea.  GI:  No nausea, vomiting, diarrhea  or constipation.  GU:  No dysuria or discharge.  MUSCULOSKELETAL:  Left  hip.   PHYSICAL EXAMINATION:  VITAL SIGNS:  Pulse 72, respirations 14, blood  pressure 138/96.  GENERAL:  A 60 year old white male, well-nourished, well-developed,  muscular build, slightly overweight, accompanied by his wife.  He is  alert, oriented and cooperative.  HEENT:  Normocephalic and atraumatic.  Pupils round and reactive.  Oropharynx clear.  Extraocular movements intact.  NECK:  Supple.  CHEST:  Clear.  HEART:  Regular rate and rhythm.  No murmur.  S1-S2 noted.  ABDOMEN:  Soft, nontender.  Bowel sounds present  RECTAL/BREASTS/GENITALIA:  Not done; not pertinent to present illness.  EXTREMITIES:  Left hip shows 90 degrees of flexion, zero internal  rotation, 20 degrees external rotation, 20 degrees abduction.   IMPRESSION:  1. Osteoarthritis, right hip.  2. Mild anxiety.  3.  Borderline hypertension.  4. Reflux.  5. Hemorrhoids.   PLAN:  The patient admitted to [facility] to undergo a left  total hip replacement arthroplasty.  Surgery will be performed by Dr.  Ollen Chambers.      Samuel Chambers, P.A.      Samuel Chambers, M.D.  Electronically Signed    ALP/MEDQ  D:  10/26/2006  T:  10/26/2006  Job:  [id number]   cc:   Samuel Senior A. Clent Ridges, MD  [address] [place]  [place] [address]

## 2010-12-14 NOTE — Assessment & Plan Note (Signed)
[facility] OFFICE NOTE   NAME:Trivedi, Samuel Chambers                   MRN:          [id number]  DATE:05/09/2006                            DOB:          02/18/1951    This is a 60 year old gentleman here for a complete physical examination.   Generally, he has been doing fairly well but has a couple of things to  describe.  First off, for the past couple of weeks he has dealt with sinus  pressure, stuffy head, post nasal drainage, chest congestion, and a  nonproductive cough.  A Zithromax Z-Pak was called in for him which he  finished.  He said this did help him feel much better but now he still has  some lingering head congestion and a dry cough that will not go away.  There  is no fever.  Also, he has been having some problems with increased anxiety  lately.  We had treated him successfully with Lexapro in the past but he has  been off of it for about a year and a half now, however, he has experienced  some increased stress in his life and primarily mentions his daughter's  pregnancy.  Apparently there is some concern on the gynecologist's part and  he has found this very unnerving.  He has trouble relaxing and trouble  sleeping.  He would like to get back on Lexapro if possible.  Otherwise, he  admits to not getting much exercise lately, although he has been watching  his diet fairly closely.  We followed him for hypertension and  hyperlipidemia in the past.  He had been on medications in the past but took  himself off of them about a year or two ago.   Further details of his past medical history, family history, social history,  etc., refer to last physical note dated March 12, 2005.   ALLERGIES:  NONE.   CURRENT MEDICATIONS:  1. Cialis 20 mg as-needed.  2. Nexium 40 mg as-needed.   OBJECTIVE:  VITAL SIGNS:  Height 5 foot 11 inches, weight 243, BP 148/86,  pulse 88 and regular.  GENERAL:  He remains  overweight.  Affect is bright.  SKIN:  Clear.  EYES:  Clear.  OROPHARYNX:  Clear.  NECK:  Supple without lymphadenopathy, masses.  LUNGS:  Clear.  CARDIAC:  Rate and rhythm regular without gallops, murmurs, rubs.  Distal  pulses are full.  EKG is within normal limits.  ABDOMEN:  Soft.  Normal bowel sounds.  Nontender.  No masses.  GENITALIA:  Normal male.  He is circumcised.  RECTAL:  No masses or tenderness.  Prostate is moderately enlarged but  smooth.  Stool is hemoccult negative.  EXTREMITIES:  No clubbing, cyanosis, or edema.  NEUROLOGIC:  Grossly intact.   He was here for fasting labs on April 22, 2006.  These were all  completely normal, except for a marginal lipid panel.  HDL was low at 31 but  LDL was actually acceptable at 108.   ASSESSMENT/PLAN:  1. Complete physical.  We talked about increasing exercise and loosing  weight.  2. Anxiety.  We will get him back on Lexapro 10 mg once a day.  He will      let me know if he needs anything else.  3. Gastroesophageal reflux disease, stable.  4. Hyperlipidemia, stable on diet control only.  5. Hypertension, stable off of medications.  6. Sinusitis.  Biaxin XL 500 mg two tablets a day for 10 days.            ______________________________  Tera Mater Clent Ridges, MD     SAF/MedQ  DD:  05/09/2006  DT:  05/11/2006  Job #:  [id number]

## 2011-05-09 LAB — CBC
HCT: 38.9 — ABNORMAL LOW
MCHC: 34.3
MCV: 83.5
Platelets: 220

## 2011-05-09 LAB — BASIC METABOLIC PANEL
Calcium: 8.8
GFR calc Af Amer: >60
GFR calc non Af Amer: >60
Glucose, Bld: 107 — ABNORMAL HIGH
Sodium: 139

## 2011-05-09 LAB — CARDIAC PANEL(CRET KIN+CKTOT+MB+TROPI)
CK, MB: 2.1
Total CK: 200

## 2011-05-13 LAB — CBC
HCT: 40
Hemoglobin: 13.4
MCHC: 33.6
MCV: 81.9
Platelets: 224
RBC: 4.88
RDW: 15.5 — ABNORMAL HIGH
WBC: 8.1

## 2011-05-13 LAB — DIFFERENTIAL
Basophils Absolute: 0
Basophils Relative: 0
Eosinophils Absolute: 0.3
Eosinophils Relative: 4
Lymphocytes Relative: 32
Lymphs Abs: 2.6
Monocytes Absolute: 0.6
Monocytes Relative: 8
Neutro Abs: 4.5
Neutrophils Relative %: 56

## 2011-05-13 LAB — BASIC METABOLIC PANEL
BUN: 18
CO2: 25
Calcium: 9
Chloride: 104
Creatinine, Ser: 1.01
GFR calc Af Amer: >60

## 2011-05-13 LAB — BASIC METABOLIC PANEL WITH GFR
GFR calc non Af Amer: >60
Glucose, Bld: 94
Potassium: 4.3
Sodium: 136

## 2011-05-13 LAB — POCT CARDIAC MARKERS
CKMB, poc: 1.5
Myoglobin, poc: 80.2
Operator id: 288331

## 2011-09-12 ENCOUNTER — Encounter: Payer: Self-pay | Admitting: Gastroenterology

## 2011-10-04 ENCOUNTER — Other Ambulatory Visit: Payer: Self-pay | Admitting: Gastroenterology

## 2011-11-18 ENCOUNTER — Other Ambulatory Visit: Payer: Self-pay | Admitting: Gastroenterology

## 2011-12-19 HISTORY — PX: NM MYOCAR PERF WALL MOTION: HXRAD629

## 2011-12-30 ENCOUNTER — Encounter: Payer: Self-pay | Admitting: Gastroenterology

## 2012-01-21 ENCOUNTER — Encounter: Payer: Self-pay | Admitting: Gastroenterology

## 2012-01-21 ENCOUNTER — Ambulatory Visit (INDEPENDENT_AMBULATORY_CARE_PROVIDER_SITE_OTHER): Payer: Managed Care, Other (non HMO) | Admitting: Gastroenterology

## 2012-01-21 VITALS — BP 128/84 | HR 87 | Ht 71.0 in | Wt 254.4 lb

## 2012-01-21 DIAGNOSIS — Z86010 Personal history of colonic polyps: Secondary | ICD-10-CM

## 2012-01-21 DIAGNOSIS — I2510 Atherosclerotic heart disease of native coronary artery without angina pectoris: Secondary | ICD-10-CM

## 2012-01-21 MED ORDER — MOVIPREP 100 G PO SOLR: 1.0000 | Freq: Once | ORAL | Status: DC

## 2012-01-21 NOTE — Patient Instructions (Addendum)
You have been scheduled for a colonoscopy with propofol. Please follow written instructions given to you at your visit today.  Please pick up your prep kit at the pharmacy within the next 1-3 days. You will be contacted by our office prior to your procedure for directions on holding your Plavix.  If you do not hear from our office 1 week prior to your scheduled procedure, please call [phone] to discuss.  cc: Theressa Millard, MD        Julieanne Manson, MD

## 2012-01-21 NOTE — Progress Notes (Signed)
History of Present Illness: This is a 61 year old male with a history of adenomatous colon polyps noted on colonoscopy in September 2006. He has a history of coronary artery disease and status post coronary artery stent placement about 4 years ago. He states he had a recent thorough evaluation by Dr. Caprice Kluver. He has rare episodes of reflux treated with Zantac as needed. Denies weight loss, abdominal pain, constipation, diarrhea, change in stool caliber, melena, hematochezia, nausea, vomiting, dysphagia, chest pain.  Allergies  Allergen Reactions  . Warfarin And Related    No outpatient prescriptions prior to visit.   Past Medical History  Diagnosis Date  . Diverticulosis   . Internal hemorrhoids   . Adenomatous colon polyp 03/2005  . Hypertension   . Hyperlipidemia   . Anxiety   . GERD (gastroesophageal reflux disease)   . Degenerative joint disease   . CAD (coronary artery disease)    Past Surgical History  Procedure Date  . Hip surgery     complete  . Heel spur surgery   . Hemorrhoid surgery   . Coronary angioplasty with stent placement    History   Social History  . Marital Status: Married    Spouse Name: N/A    Number of Children: 1  . Years of Education: N/A   Occupational History  .     Social History Main Topics  . Smoking status: Never Smoker   . Smokeless tobacco: None  . Alcohol Use: No  . Drug Use: No  . Sexually Active: None   Other Topics Concern  . None   Social History Narrative   Very little 0-2 drinks a week   Family History  Problem Relation Age of Onset  . Breast cancer Mother   . Prostate cancer Father   . Diabetes Mother    Review of Systems: Pertinent positive and negative review of systems were noted in the above HPI section. All other review of systems were otherwise negative.  Physical Exam: General: Well developed , well nourished, no acute distress Head: Normocephalic and atraumatic Eyes:  sclerae anicteric, EOMI Ears: Normal  auditory acuity Mouth: No deformity or lesions Neck: Supple, no masses or thyromegaly Lungs: Clear throughout to auscultation Heart: Regular rate and rhythm; no murmurs, rubs or bruits Abdomen: Soft, non tender and non distended. No masses, hepatosplenomegaly or hernias noted. Normal Bowel sounds Rectal: deferred to colonoscopy Musculoskeletal: Symmetrical with no gross deformities  Skin: No lesions on visible extremities Pulses:  Normal pulses noted Extremities: No clubbing, cyanosis, edema or deformities noted Neurological: Alert oriented x 4, grossly nonfocal Cervical Nodes:  No significant cervical adenopathy Inguinal Nodes: No significant inguinal adenopathy Psychological:  Alert and cooperative. Normal mood and affect  Assessment and Recommendations:  1. Personal history of adenomatous colon polyps. He is overdue for surveillance colonoscopy. Schedule colonoscopy. The risks, benefits, and alternatives to colonoscopy with possible biopsy and possible polypectomy were discussed with the patient and they consent to proceed. Will request clearance to hold Plavix for 5 days prior to colonoscopy. He will continue on daily aspirin.  2. GERD. Infrequent symptoms. Standard antireflux measures and Zantac 150 mg twice a day as needed.

## 2012-01-22 ENCOUNTER — Telehealth: Payer: Self-pay

## 2012-01-22 ENCOUNTER — Encounter: Payer: Self-pay | Admitting: Gastroenterology

## 2012-01-22 NOTE — Telephone Encounter (Signed)
Received fax from Dr. Fredirick Maudlin office stating patient can come off Plavix 5 days before his procedure. Notified patient and patient verbalized understanding.

## 2012-02-24 NOTE — Telephone Encounter (Signed)
Error

## 2012-03-06 ENCOUNTER — Encounter: Payer: Self-pay | Admitting: Gastroenterology

## 2012-03-06 ENCOUNTER — Ambulatory Visit (AMBULATORY_SURGERY_CENTER): Payer: Managed Care, Other (non HMO) | Admitting: Gastroenterology

## 2012-03-06 VITALS — BP 145/89 | HR 106 | Temp 98.1°F | Resp 20 | Ht 71.0 in | Wt 254.0 lb

## 2012-03-06 DIAGNOSIS — D126 Benign neoplasm of colon, unspecified: Secondary | ICD-10-CM

## 2012-03-06 DIAGNOSIS — Z86010 Personal history of colonic polyps: Secondary | ICD-10-CM

## 2012-03-06 DIAGNOSIS — Z1211 Encounter for screening for malignant neoplasm of colon: Secondary | ICD-10-CM

## 2012-03-06 MED ORDER — SODIUM CHLORIDE 0.9 % IV SOLN: 500.0000 mL | INTRAVENOUS | Status: DC

## 2012-03-06 NOTE — Patient Instructions (Addendum)

## 2012-03-06 NOTE — Op Note (Addendum)
[facility] [address] [place], [place]  [address]  COLONOSCOPY PROCEDURE REPORT  PATIENT:  Samuel, Chambers  MR#:  [id number] BIRTHDATE:  October 15, 1950, 61 yrs. old  GENDER:  male ENDOSCOPIST:  Judie Petit T. Russella Dar, MD, [facility]  PROCEDURE DATE:  03/06/2012 PROCEDURE:  Colonoscopy with hot biopsy and snare polypectomy ASA CLASS:  Class II INDICATIONS:  1) surveillance and high-risk screening  2) history of pre-cancerous (adenomatous) colon polyps: 03/2005 MEDICATIONS:   MAC sedation, administered by CRNA, propofol (Diprivan) 500 mg IV DESCRIPTION OF PROCEDURE:   After the risks benefits and alternatives of the procedure were thoroughly explained, informed consent was obtained.  Digital rectal exam was performed and revealed no abnormalities.   The LB CF-[id number] endoscope was introduced through the anus and advanced to the cecum, which was identified by both the appendix and ileocecal valve, without limitations.  The quality of the prep was adequate, using MoviPrep.  The instrument was then slowly withdrawn as the colon was fully examined. <<PROCEDUREIMAGES>> FINDINGS:  A sessile polyp was found in the cecum. It was 7 mm in size. Polyp was snared, then cauterized with monopolar cautery. Retrieval was successful. A sessile polyp was found in the ascending colon. It was 6 mm in size. Polyp was snared without cautery. Retrieval was successful.  A sessile polyp was found in the ascending colon. It was 6 mm in size. The polyp was removed using hot biopsy forceps.  A lipoma was found in the proximal transverse colon. It was 12 mm in size.  A sessile polyp was found in the descending colon. It was 9 mm in size. Polyp was snared, then cauterized with monopolar cautery. Retrieval was successful. Otherwise normal colonoscopy without other polyps, masses, vascular ectasias, or inflammatory changes.  Retroflexed views in the rectum revealed internal hemorrhoids, small.  The time  to cecum =  2.33  minutes. The scope was then withdrawn (time =  15 min) from the patient and the procedure completed.  COMPLICATIONS:  None  ENDOSCOPIC IMPRESSION: 1) 7 mm sessile polyp in the cecum 2) 6 mm sessile polyp in the ascending colon 3) 6 mm sessile polyp in the ascending colon 4) 12 mm lipoma in the proximal transverse colon 5) 9 mm sessile polyp in the descending colon 6) Internal hemorrhoids  RECOMMENDATIONS: 1) Await pathology results 2) Repeat Colonoscopy with a more extensive bowel prep in 3 years if 3 or 4 polyps adenomatous, otherwise 5 years. 3) Resume Plavix tomorrow  Judie Petit T. Russella Dar, MD, [facility]  CC:  Theressa Millard, MD  n. REVISED:  03/06/2012 11:01 AM eSIGNED:   Judie Petit T. Cheyene Hamric at 03/06/2012 11:01 AM  Faythe Ghee, [id number]

## 2012-03-06 NOTE — Progress Notes (Signed)
Patient did not experience any of the following events: a burn prior to discharge; a fall within the facility; wrong site/side/patient/procedure/implant event; or a hospital transfer or hospital admission upon discharge from the facility. (G8907) Patient did not have preoperative order for IV antibiotic SSI prophylaxis. (G8918)  

## 2012-03-09 ENCOUNTER — Telehealth: Payer: Self-pay | Admitting: *Deleted

## 2012-03-09 NOTE — Telephone Encounter (Signed)
  Follow up Call-  Call back number 03/06/2012  Post procedure Call Back phone  # [phone]  Permission to leave phone message Yes    Left message on answering machine to call us back if he is experiencing any problems or has questions

## 2012-03-12 ENCOUNTER — Encounter: Payer: Self-pay | Admitting: Gastroenterology

## 2012-03-13 ENCOUNTER — Other Ambulatory Visit: Payer: Self-pay | Admitting: Orthopedic Surgery

## 2012-03-13 MED ORDER — DEXAMETHASONE SODIUM PHOSPHATE 10 MG/ML IJ SOLN: 10.0000 mg | Freq: Once | INTRAMUSCULAR | Status: DC

## 2012-03-13 NOTE — Progress Notes (Signed)
Preoperative surgical orders have been place into the Epic hospital system for Samuel Chambers on 03/13/2012, 12:06 PM  by Patrica Duel for surgery on 03/27/2012.  Preop Knee Scope orders including IV Tylenol and IV Decadron as long as there are no contraindications to the above medications. Avel Peace, PA-C

## 2012-03-18 ENCOUNTER — Encounter (HOSPITAL_COMMUNITY): Payer: Self-pay | Admitting: Pharmacy Technician

## 2012-03-23 ENCOUNTER — Encounter (HOSPITAL_COMMUNITY)
Admission: RE | Admit: 2012-03-23 | Discharge: 2012-03-23 | Disposition: A | Discharge status: Home / Self Care | Payer: Managed Care, Other (non HMO) | Source: Ambulatory Visit | Attending: Orthopedic Surgery | Admitting: Orthopedic Surgery

## 2012-03-23 ENCOUNTER — Ambulatory Visit (HOSPITAL_COMMUNITY)
Admission: RE | Admit: 2012-03-23 | Discharge: 2012-03-23 | Disposition: A | Discharge status: Home / Self Care | Payer: Managed Care, Other (non HMO) | Source: Ambulatory Visit | Attending: Orthopedic Surgery | Admitting: Orthopedic Surgery

## 2012-03-23 DIAGNOSIS — IMO0002 Reserved for concepts with insufficient information to code with codable children: Secondary | ICD-10-CM | POA: Insufficient documentation

## 2012-03-23 DIAGNOSIS — Z01812 Encounter for preprocedural laboratory examination: Secondary | ICD-10-CM | POA: Insufficient documentation

## 2012-03-23 DIAGNOSIS — X58XXXA Exposure to other specified factors, initial encounter: Secondary | ICD-10-CM | POA: Insufficient documentation

## 2012-03-23 DIAGNOSIS — I10 Essential (primary) hypertension: Secondary | ICD-10-CM | POA: Insufficient documentation

## 2012-03-23 LAB — BASIC METABOLIC PANEL
CO2: 29 mEq/L (ref 19–32)
Calcium: 9.4 mg/dL (ref 8.4–10.5)
Creatinine, Ser: 0.82 mg/dL (ref 0.50–1.35)
GFR calc non Af Amer: >90 mL/min (ref >90)
Glucose, Bld: 110 mg/dL — ABNORMAL HIGH (ref 70–99)
Sodium: 140 mEq/L (ref 135–145)

## 2012-03-23 LAB — CBC
MCH: 29.7 pg (ref 26.0–34.0)
MCV: 82.5 fL (ref 78.0–100.0)
Platelets: 227 10*3/uL (ref 150–400)
RBC: 4.74 MIL/uL (ref 4.22–5.81)
RDW: 13.5 % (ref 11.5–15.5)
WBC: 8.2 10*3/uL (ref 4.0–10.5)

## 2012-03-23 LAB — SURGICAL PCR SCREEN: Staphylococcus aureus: NEGATIVE

## 2012-03-23 NOTE — Patient Instructions (Signed)
20 Lori Liew  03/23/2012   Your procedure is scheduled on:  Friday 03/27/2012 at 1030 am  Report to [facility] at 800 AM.  Call this number if you have problems the morning of surgery: [phone]   Remember:   Do not eat food:After Midnight.  May have clear liquids:until Midnight .  Marland Kitchen  Take these medicines the morning of surgery with A SIP OF WATER: Cymbalta, Metoprolol, Zantac   Do not wear jewelry  Do not wear lotions, powders, or perfumes.   Do not shave 48 hours prior to surgery. Men may shave face and neck.  Do not bring valuables to the hospital.  Contacts, dentures or bridgework may not be worn into surgery.  Leave suitcase in the car. After surgery it may be brought to your room.  For patients admitted to the hospital, checkout time is 11:00 AM the day of discharge.   Patients discharged the day of surgery will not be allowed to drive home.  Name and phone number of your driver: Larita Fife [phone]  Special Instructions: CHG Shower Use Special Wash: 1/2 bottle night before surgery and 1/2 bottle morning of surgery.   Please read over the following fact sheets that you were given: MRSA Information, Sleep Apnea sheet, Incentive Spirometry sheet,                       Any questions, please call me at [phone] [facility].Georgeanna Lea, RN,BSN

## 2012-03-23 NOTE — Pre-Procedure Instructions (Signed)
EKG from Dr. Clarene Duke 11/28/2011, Stress test from [facility] 12/19/2011, Office visit 11/28/2011 , Echo from 05/27/2002 from [facility] all on chart. Reviewed pre-op instructions with patient using Teach back method.

## 2012-03-26 NOTE — H&P (Signed)
  CC- Samuel Chambers is a 61 y.o. male who presents with left knee pain.  HPI- . Knee Pain: Patient presents with knee pain involving the  left knee. Onset of the symptoms was several months ago. Inciting event: none known. Current symptoms include giving out, pain located medially, stiffness and swelling. Pain is aggravated by pivoting, rising after sitting, squatting and standing.  Patient has had no prior knee problems. Evaluation to date: MRI: abnormal medial meniscal tear. Treatment to date: avoidance of offending activity and rest.  Past Medical History  Diagnosis Date  . Diverticulosis   . Internal hemorrhoids   . Adenomatous colon polyp 03/2005  . Hypertension   . Hyperlipidemia   . Anxiety   . GERD (gastroesophageal reflux disease)   . Degenerative joint disease   . CAD (coronary artery disease)     Past Surgical History  Procedure Date  . Hip surgery     complete  . Heel spur surgery   . Hemorrhoid surgery   . Coronary angioplasty with stent placement     Prior to Admission medications   Medication Sig Start Date End Date Taking? Authorizing Provider  aspirin EC 81 MG tablet Take 81 mg by mouth daily.    Historical Provider, MD  CIALIS 20 MG tablet Take 20 mg by mouth daily as needed.  02/28/12   Historical Provider, MD  clopidogrel (PLAVIX) 75 MG tablet Take 75 mg by mouth every morning.    Historical Provider, MD  DULoxetine (CYMBALTA) 60 MG capsule Take 60 mg by mouth every morning.     Historical Provider, MD  fish oil-omega-3 fatty acids 1000 MG capsule Take 2 g by mouth daily.    Historical Provider, MD  losartan (COZAAR) 50 MG tablet Take 50 mg by mouth daily.    Historical Provider, MD  metoprolol succinate (TOPROL-XL) 50 MG 24 hr tablet Take 50 mg by mouth every morning. Take with or immediately following a meal.    Historical Provider, MD  ranitidine (ZANTAC) 150 MG capsule Take 150 mg by mouth daily as needed. For acid reflux    Historical Provider, MD    KNEE EXAM soft tissue tenderness over medial joint line, effusion, reduced range of motion, negative drawer sign, collateral ligaments intact, normal ipsilateral hip exam  Physical Examination: General appearance - alert, well appearing, and in no distress Mental status - alert, oriented to person, place, and time Chest - clear to auscultation, no wheezes, rales or rhonchi, symmetric air entry Heart - normal rate, regular rhythm, normal S1, S2, no murmurs, rubs, clicks or gallops Abdomen - soft, nontender, nondistended, no masses or organomegaly Neurological - alert, oriented, normal speech, no focal findings or movement disorder noted   Asessment/Plan--- Left knee medial meniscal tear- - Plan left knee arthroscopy with meniscal debridement. Procedure risks and potential comps discussed with patient who elects to proceed. Goals are decreased pain and increased function with a high likelihood of achieving both

## 2012-03-27 ENCOUNTER — Encounter (HOSPITAL_COMMUNITY): Payer: Self-pay | Admitting: *Deleted

## 2012-03-27 ENCOUNTER — Encounter (HOSPITAL_COMMUNITY)
Admission: RE | Disposition: A | Discharge status: Home / Self Care | Payer: Self-pay | Source: Ambulatory Visit | Attending: Orthopedic Surgery

## 2012-03-27 ENCOUNTER — Ambulatory Visit (HOSPITAL_COMMUNITY)
Admission: RE | Admit: 2012-03-27 | Discharge: 2012-03-27 | Disposition: A | Discharge status: Home / Self Care | Payer: Managed Care, Other (non HMO) | Source: Ambulatory Visit | Attending: Orthopedic Surgery | Admitting: Orthopedic Surgery

## 2012-03-27 ENCOUNTER — Ambulatory Visit (HOSPITAL_COMMUNITY): Payer: Managed Care, Other (non HMO) | Admitting: *Deleted

## 2012-03-27 DIAGNOSIS — K219 Gastro-esophageal reflux disease without esophagitis: Secondary | ICD-10-CM | POA: Insufficient documentation

## 2012-03-27 DIAGNOSIS — E785 Hyperlipidemia, unspecified: Secondary | ICD-10-CM | POA: Insufficient documentation

## 2012-03-27 DIAGNOSIS — S83249A Other tear of medial meniscus, current injury, unspecified knee, initial encounter: Secondary | ICD-10-CM | POA: Diagnosis present

## 2012-03-27 DIAGNOSIS — X58XXXA Exposure to other specified factors, initial encounter: Secondary | ICD-10-CM | POA: Insufficient documentation

## 2012-03-27 DIAGNOSIS — Z79899 Other long term (current) drug therapy: Secondary | ICD-10-CM | POA: Insufficient documentation

## 2012-03-27 DIAGNOSIS — IMO0002 Reserved for concepts with insufficient information to code with codable children: Secondary | ICD-10-CM | POA: Insufficient documentation

## 2012-03-27 DIAGNOSIS — I10 Essential (primary) hypertension: Secondary | ICD-10-CM | POA: Insufficient documentation

## 2012-03-27 DIAGNOSIS — Z7982 Long term (current) use of aspirin: Secondary | ICD-10-CM | POA: Insufficient documentation

## 2012-03-27 DIAGNOSIS — I2510 Atherosclerotic heart disease of native coronary artery without angina pectoris: Secondary | ICD-10-CM | POA: Insufficient documentation

## 2012-03-27 HISTORY — PX: KNEE ARTHROSCOPY: SHX127

## 2012-03-27 SURGERY — ARTHROSCOPY, KNEE
Anesthesia: General | Site: Knee | Laterality: Left | Wound class: Clean

## 2012-03-27 MED ORDER — BUPIVACAINE-EPINEPHRINE PF 0.25-1:200000 % IJ SOLN
INTRAMUSCULAR | Status: AC
  Filled 2012-03-27: qty 30

## 2012-03-27 MED ORDER — LIDOCAINE HCL (CARDIAC) 20 MG/ML IV SOLN
INTRAVENOUS | Status: DC | PRN
  Administered 2012-03-27: 50 mg via INTRAVENOUS

## 2012-03-27 MED ORDER — CEFAZOLIN SODIUM 1-5 GM-% IV SOLN
INTRAVENOUS | Status: AC
  Filled 2012-03-27: qty 50

## 2012-03-27 MED ORDER — METHOCARBAMOL 500 MG PO TABS
500.0000 mg | ORAL_TABLET | Freq: Once | ORAL | Status: AC
  Administered 2012-03-27: 500 mg via ORAL
  Filled 2012-03-27: qty 1

## 2012-03-27 MED ORDER — PROPOFOL 10 MG/ML IV BOLUS
INTRAVENOUS | Status: DC | PRN
  Administered 2012-03-27: 200 mg via INTRAVENOUS

## 2012-03-27 MED ORDER — PROMETHAZINE HCL 25 MG/ML IJ SOLN: 6.2500 mg | INTRAMUSCULAR | Status: DC | PRN

## 2012-03-27 MED ORDER — METHOCARBAMOL 500 MG PO TABS: 500.0000 mg | ORAL_TABLET | Freq: Four times a day (QID) | ORAL | Status: AC

## 2012-03-27 MED ORDER — FENTANYL CITRATE 0.05 MG/ML IJ SOLN
25.0000 ug | INTRAMUSCULAR | Status: AC | PRN
  Administered 2012-03-27 (×6): 25 ug via INTRAVENOUS

## 2012-03-27 MED ORDER — MEPERIDINE HCL 50 MG/ML IJ SOLN: 6.2500 mg | INTRAMUSCULAR | Status: DC | PRN

## 2012-03-27 MED ORDER — SODIUM CHLORIDE 0.9 % IV SOLN: INTRAVENOUS | Status: DC

## 2012-03-27 MED ORDER — CHLORHEXIDINE GLUCONATE 4 % EX LIQD: 60.0000 mL | Freq: Once | CUTANEOUS | Status: DC

## 2012-03-27 MED ORDER — CEFAZOLIN SODIUM-DEXTROSE 2-3 GM-% IV SOLR
INTRAVENOUS | Status: AC
  Filled 2012-03-27: qty 50

## 2012-03-27 MED ORDER — FENTANYL CITRATE 0.05 MG/ML IJ SOLN
INTRAMUSCULAR | Status: DC | PRN
  Administered 2012-03-27 (×2): 50 ug via INTRAVENOUS
  Administered 2012-03-27: 25 ug via INTRAVENOUS

## 2012-03-27 MED ORDER — ACETAMINOPHEN 10 MG/ML IV SOLN: 1000.0000 mg | Freq: Once | INTRAVENOUS | Status: DC

## 2012-03-27 MED ORDER — FENTANYL CITRATE 0.05 MG/ML IJ SOLN
INTRAMUSCULAR | Status: AC
  Filled 2012-03-27: qty 2

## 2012-03-27 MED ORDER — ACETAMINOPHEN 10 MG/ML IV SOLN
INTRAVENOUS | Status: DC | PRN
  Administered 2012-03-27: 1000 mg via INTRAVENOUS

## 2012-03-27 MED ORDER — OXYCODONE-ACETAMINOPHEN 5-325 MG PO TABS
1.0000 | ORAL_TABLET | Freq: Once | ORAL | Status: AC
  Administered 2012-03-27: 1 via ORAL
  Filled 2012-03-27: qty 1

## 2012-03-27 MED ORDER — BUPIVACAINE-EPINEPHRINE 0.25% -1:200000 IJ SOLN
INTRAMUSCULAR | Status: DC | PRN
  Administered 2012-03-27: 20 mL

## 2012-03-27 MED ORDER — LACTATED RINGERS IR SOLN
Status: DC | PRN
  Administered 2012-03-27 (×2): 3000 mL

## 2012-03-27 MED ORDER — ACETAMINOPHEN 10 MG/ML IV SOLN
INTRAVENOUS | Status: AC
  Filled 2012-03-27: qty 100

## 2012-03-27 MED ORDER — LACTATED RINGERS IV SOLN
INTRAVENOUS | Status: DC
  Administered 2012-03-27: 1000 mL via INTRAVENOUS

## 2012-03-27 MED ORDER — DEXTROSE 5 % IV SOLN
3.0000 g | INTRAVENOUS | Status: AC
  Administered 2012-03-27: 3 g via INTRAVENOUS
  Filled 2012-03-27: qty 3000

## 2012-03-27 MED ORDER — LACTATED RINGERS IV SOLN
INTRAVENOUS | Status: DC | PRN
  Administered 2012-03-27: 10:00:00 via INTRAVENOUS

## 2012-03-27 MED ORDER — OXYCODONE-ACETAMINOPHEN 5-325 MG PO TABS: 1.0000 | ORAL_TABLET | ORAL | Status: AC | PRN

## 2012-03-27 SURGICAL SUPPLY — 30 items
BLADE 4.2CUDA (BLADE) ×2 IMPLANT
CLOTH BEACON ORANGE TIMEOUT ST (SAFETY) ×2 IMPLANT
CUFF TOURN SGL QUICK 34 (TOURNIQUET CUFF) ×1
CUFF TRNQT CYL 34X4X40X1 (TOURNIQUET CUFF) ×1 IMPLANT
DRAPE U-SHAPE 47X51 STRL (DRAPES) ×2 IMPLANT
DRSG EMULSION OIL 3X3 NADH (GAUZE/BANDAGES/DRESSINGS) ×2 IMPLANT
DRSG PAD ABDOMINAL 8X10 ST (GAUZE/BANDAGES/DRESSINGS) ×2 IMPLANT
DURAPREP 26ML APPLICATOR (WOUND CARE) ×2 IMPLANT
GLOVE BIO SURGEON STRL SZ7.5 (GLOVE) IMPLANT
GLOVE BIO SURGEON STRL SZ8 (GLOVE) ×2 IMPLANT
GLOVE BIOGEL PI IND STRL 6.5 (GLOVE) ×1 IMPLANT
GLOVE BIOGEL PI IND STRL 8 (GLOVE) ×1 IMPLANT
GLOVE BIOGEL PI INDICATOR 6.5 (GLOVE) ×1
GLOVE BIOGEL PI INDICATOR 8 (GLOVE) ×1
GLOVE SURG SS PI 6.5 STRL IVOR (GLOVE) ×2 IMPLANT
GOWN STRL NON-REIN LRG LVL3 (GOWN DISPOSABLE) ×4 IMPLANT
MANIFOLD NEPTUNE II (INSTRUMENTS) ×4 IMPLANT
PACK ARTHROSCOPY WL (CUSTOM PROCEDURE TRAY) ×2 IMPLANT
PACK ICE MAXI GEL EZY WRAP (MISCELLANEOUS) ×6 IMPLANT
PADDING CAST COTTON 6X4 STRL (CAST SUPPLIES) ×2 IMPLANT
POSITIONER SURGICAL ARM (MISCELLANEOUS) ×2 IMPLANT
SET ARTHROSCOPY TUBING (MISCELLANEOUS) ×1
SET ARTHROSCOPY TUBING LN (MISCELLANEOUS) ×1 IMPLANT
SPONGE GAUZE 4X4 12PLY (GAUZE/BANDAGES/DRESSINGS) ×2 IMPLANT
SUT ETHILON 4 0 PS 2 18 (SUTURE) ×2 IMPLANT
TAPE CLOTH SURG 4X10 WHT LF (GAUZE/BANDAGES/DRESSINGS) ×2 IMPLANT
TOWEL OR 17X26 10 PK STRL BLUE (TOWEL DISPOSABLE) ×2 IMPLANT
WAND 90 DEG TURBOVAC W/CORD (SURGICAL WAND) ×2 IMPLANT
WATER STERILE IRR 500ML POUR (IV SOLUTION) ×2 IMPLANT
WRAP KNEE MAXI GEL POST OP (GAUZE/BANDAGES/DRESSINGS) ×2 IMPLANT

## 2012-03-27 NOTE — Interval H&P Note (Signed)
History and Physical Interval Note:  03/27/2012 10:27 AM  Samuel Chambers  has presented today for surgery, with the diagnosis of left knee medial meniscal tear  The various methods of treatment have been discussed with the patient and family. After consideration of risks, benefits and other options for treatment, the patient has consented to  Procedure(s) (LRB): ARTHROSCOPY KNEE (Left) as a surgical intervention .  The patient's history has been reviewed, patient examined, no change in status, stable for surgery.  I have reviewed the patient's chart and labs.  Questions were answered to the patient's satisfaction.     Loanne Drilling

## 2012-03-27 NOTE — Anesthesia Postprocedure Evaluation (Signed)
  Anesthesia Post-op Note  Patient: Samuel Chambers  Procedure(s) Performed: Procedure(s) (LRB): ARTHROSCOPY KNEE (Left)  Patient Location: PACU  Anesthesia Type: General  Level of Consciousness: awake and alert   Airway and Oxygen Therapy: Patient Spontanous Breathing  Post-op Pain: mild  Post-op Assessment: Post-op Vital signs reviewed, Patient's Cardiovascular Status Stable, Respiratory Function Stable, Patent Airway and No signs of Nausea or vomiting  Post-op Vital Signs: stable  Complications: No apparent anesthesia complications

## 2012-03-27 NOTE — Anesthesia Preprocedure Evaluation (Signed)
Anesthesia Evaluation  Patient identified by MRN, date of birth, ID band Patient awake    Reviewed: Allergy & Precautions, H&P , NPO status , Patient's Chart, lab work & pertinent test results  Airway Mallampati: II TM Distance: >3 FB Neck ROM: Full    Dental No notable dental hx.    Pulmonary neg pulmonary ROS,  breath sounds clear to auscultation  Pulmonary exam normal       Cardiovascular hypertension, Pt. on medications - angina+ CAD and + Cardiac Stents negative cardio ROS  Rhythm:Regular Rate:Normal     Neuro/Psych negative neurological ROS  negative psych ROS   GI/Hepatic negative GI ROS, Neg liver ROS, GERD-  Medicated and Controlled,  Endo/Other  negative endocrine ROSMorbid obesity  Renal/GU negative Renal ROS  negative genitourinary   Musculoskeletal negative musculoskeletal ROS (+)   Abdominal   Peds negative pediatric ROS (+)  Hematology negative hematology ROS (+)   Anesthesia Other Findings   Reproductive/Obstetrics negative OB ROS                           Anesthesia Physical Anesthesia Plan  ASA: III  Anesthesia Plan: General   Post-op Pain Management:    Induction: Intravenous  Airway Management Planned: LMA  Additional Equipment:   Intra-op Plan:   Post-operative Plan: Extubation in OR  Informed Consent: I have reviewed the patients History and Physical, chart, labs and discussed the procedure including the risks, benefits and alternatives for the proposed anesthesia with the patient or authorized representative who has indicated his/her understanding and acceptance.   Dental advisory given  Plan Discussed with: CRNA  Anesthesia Plan Comments:         Anesthesia Quick Evaluation

## 2012-03-27 NOTE — Transfer of Care (Signed)
Immediate Anesthesia Transfer of Care Note  Patient: Samuel Chambers  Procedure(s) Performed: Procedure(s) (LRB): ARTHROSCOPY KNEE (Left)  Patient Location: PACU  Anesthesia Type: General  Level of Consciousness: awake, alert  and oriented  Airway & Oxygen Therapy: Patient Spontanous Breathing and Patient connected to face mask oxygen  Post-op Assessment: Report given to PACU RN and Post -op Vital signs reviewed and stable  Post vital signs: Reviewed and stable  Complications: No apparent anesthesia complications

## 2012-03-27 NOTE — Op Note (Signed)
Preoperative diagnosis-  Left knee medial meniscal tear  Postoperative diagnosis Left- knee medial meniscal tear   Plus Left medial femoral chondral defect  Procedure- Left knee arthroscopy with medial  Meniscal debridement and chondroplasty  Surgeon- Gus Rankin. Karrigan Messamore, MD  Anesthesia-General  EBL-  minimal Complications- None  Condition- PACU - hemodynamically stable.  Brief clinical note- -Samuel Chambers is a 61 y.o.  male with a several month history of left knee pain and mechanical symptoms. Exam and history suggested medial meniscal tear confirmed by MRI. The patient presents now for arthroscopy and debridement   Procedure in detail -       After successful administration of General anesthetic, a tourmiquet is placed high on the Left  thigh and the Left lower extremity is prepped and draped in the usual sterile fashion. Time out is performed by the surgical team. Standard superomedial and inferolateral portal sites are marked and incisions made with an 11 blade. The inflow cannula is passed through the superomedial portal and camera through the inferolateral portal and inflow is initiated. Arthroscopic visualization proceeds.      The undersurface of the patella and trochlea are visualized and they show minimal chondromalacia. The medial and lateral gutters are visualized and there are  no loose bodies. Flexion and valgus force is applied to the knee and the medial compartment is entered. A spinal needle is passed into the joint through the site marked for the inferomedial portal. A small incision is made and the dilator passed into the joint. The findings for the medial compartment are tear of the body and posterior horn of the medial meniscus with Grade III chondromalacia and a chondral defect of the medial femoral condyle. The defect is approximately 1 x 2 cm . The tear is debrided to a stable base with baskets and a shaver and sealed off with the Arthrocare. The shaver is used to  debride the unstable cartilage to a stable cartilaginous base with stable edges. It is probed and found to be stable.    The intercondylar notch is visualized and the ACL appears  normal. The lateral compartment is entered and the findings are normal .    The joint is again inspected and there are no other tears, defects or loose bodies identified. The arthroscopic equipment is then removed from the inferior portals which are closed with interrupted 4-0 nylon. 20 ml of .25% Marcaine with epinephrine are injected through the inflow cannula and the cannula is then removed and the portal closed with nylon. The incisions are cleaned and dried and a bulky sterile dressing is applied. The patient is then awakened and transported to recovery in stable condition.   03/27/2012, 11:23 AM

## 2012-03-31 ENCOUNTER — Encounter (HOSPITAL_COMMUNITY): Payer: Self-pay | Admitting: Orthopedic Surgery

## 2012-07-01 ENCOUNTER — Other Ambulatory Visit: Payer: Self-pay | Admitting: Internal Medicine

## 2012-07-01 DIAGNOSIS — R1011 Right upper quadrant pain: Secondary | ICD-10-CM

## 2012-07-03 ENCOUNTER — Other Ambulatory Visit: Payer: Self-pay | Admitting: Internal Medicine

## 2012-07-03 ENCOUNTER — Ambulatory Visit
Admission: RE | Admit: 2012-07-03 | Discharge: 2012-07-03 | Disposition: A | Discharge status: Home / Self Care | Payer: Managed Care, Other (non HMO) | Source: Ambulatory Visit | Attending: Internal Medicine | Admitting: Internal Medicine

## 2012-07-03 DIAGNOSIS — R1011 Right upper quadrant pain: Secondary | ICD-10-CM

## 2012-08-18 ENCOUNTER — Other Ambulatory Visit: Payer: Self-pay | Admitting: Orthopedic Surgery

## 2012-08-18 MED ORDER — DEXAMETHASONE SODIUM PHOSPHATE 10 MG/ML IJ SOLN: 10.0000 mg | Freq: Once | INTRAMUSCULAR | Status: DC

## 2012-08-18 MED ORDER — BUPIVACAINE LIPOSOME 1.3 % IJ SUSP: 20.0000 mL | Freq: Once | INTRAMUSCULAR | Status: DC

## 2012-08-18 NOTE — Progress Notes (Signed)
Preoperative surgical orders have been place into the Epic hospital system for Samuel Chambers on 08/18/2012, 5:33 PM  by Patrica Duel for surgery on 09/04/2012.  Preop Total Knee orders including Experal, IV Tylenol, and IV Decadron as long as there are no contraindications to the above medications. Avel Peace, PA-C

## 2012-08-27 NOTE — Patient Instructions (Signed)
Samuel Chambers  08/27/2012   Your procedure is scheduled on:  09/04/12   Report to [facility] at0500  AM.  Call this number if you have problems the morning of surgery: [phone]   Remember:   Do not eat food or drink liquids after midnight.   Take these medicines the morning of surgery with A SIP OF WATER:    Do not wear jewelry,   Do not wear lotions, powders, or perfumes. .  . Men may shave face and neck.  Do not bring valuables to the hospital.  Contacts, dentures or bridgework may not be worn into surgery.  Leave suitcase in the car. After surgery it may be brought to your room.  For patients admitted to the hospital, checkout time is 11:00 AM the day of  discharge.     SEE CHG INSTRUCTION SHEET    Please read over the following fact sheets that you were given: MRSA Information, coughing and deep breathing exercises, leg exercises, Blood Transfusion Fact Sheet, Incentive Spirometry fact Sheet                Failure to comply with these instructions may result in cancellation of your surgery.                Patient Signature ____________________________              Nurse Signature _____________________________

## 2012-08-28 ENCOUNTER — Ambulatory Visit (HOSPITAL_COMMUNITY)
Admission: RE | Admit: 2012-08-28 | Discharge: 2012-08-28 | Disposition: A | Discharge status: Home / Self Care | Payer: Managed Care, Other (non HMO) | Source: Ambulatory Visit | Attending: Orthopedic Surgery | Admitting: Orthopedic Surgery

## 2012-08-28 ENCOUNTER — Encounter (HOSPITAL_COMMUNITY)
Admission: RE | Admit: 2012-08-28 | Discharge: 2012-08-28 | Disposition: A | Discharge status: Home / Self Care | Payer: Managed Care, Other (non HMO) | Source: Ambulatory Visit | Attending: Orthopedic Surgery | Admitting: Orthopedic Surgery

## 2012-08-28 ENCOUNTER — Encounter (HOSPITAL_COMMUNITY): Payer: Self-pay | Admitting: Pharmacy Technician

## 2012-08-28 ENCOUNTER — Encounter (HOSPITAL_COMMUNITY): Payer: Self-pay

## 2012-08-28 ENCOUNTER — Encounter: Payer: Self-pay | Admitting: Pharmacy Technician

## 2012-08-28 DIAGNOSIS — I10 Essential (primary) hypertension: Secondary | ICD-10-CM | POA: Insufficient documentation

## 2012-08-28 DIAGNOSIS — Z01818 Encounter for other preprocedural examination: Secondary | ICD-10-CM | POA: Insufficient documentation

## 2012-08-28 DIAGNOSIS — Z01812 Encounter for preprocedural laboratory examination: Secondary | ICD-10-CM | POA: Insufficient documentation

## 2012-08-28 HISTORY — DX: Nausea with vomiting, unspecified: R11.2

## 2012-08-28 HISTORY — DX: Other specified postprocedural states: Z98.890

## 2012-08-28 LAB — COMPREHENSIVE METABOLIC PANEL
ALT: 40 U/L (ref 0–53)
AST: 29 U/L (ref 0–37)
Albumin: 3.9 g/dL (ref 3.5–5.2)
Alkaline Phosphatase: 64 U/L (ref 39–117)
Calcium: 9.2 mg/dL (ref 8.4–10.5)
GFR calc Af Amer: >90 mL/min (ref >90)
Potassium: 3.6 mEq/L (ref 3.5–5.1)
Sodium: 136 mEq/L (ref 135–145)
Total Protein: 6.9 g/dL (ref 6.0–8.3)

## 2012-08-28 LAB — CBC
MCH: 29.7 pg (ref 26.0–34.0)
MCHC: 35.3 g/dL (ref 30.0–36.0)
Platelets: 201 10*3/uL (ref 150–400)
RDW: 13.3 % (ref 11.5–15.5)

## 2012-08-28 LAB — URINALYSIS, ROUTINE W REFLEX MICROSCOPIC
Glucose, UA: NEGATIVE mg/dL
Leukocytes, UA: NEGATIVE
Protein, ur: NEGATIVE mg/dL
Specific Gravity, Urine: 1.027 (ref 1.005–1.030)
pH: 6.5 (ref 5.0–8.0)

## 2012-08-28 LAB — SURGICAL PCR SCREEN
MRSA, PCR: NEGATIVE
Staphylococcus aureus: NEGATIVE

## 2012-08-28 LAB — APTT: aPTT: 30 seconds (ref 24–37)

## 2012-08-28 NOTE — Progress Notes (Signed)
EKG 08/03/12 on chart  Last office visit note with Dr Herbie Baltimore 08/03/12 chart  Cath note 2008 on chart

## 2012-08-28 NOTE — Progress Notes (Signed)
Your patient has screened at an elevated risk for Obstructive Sleep Apnea using the STOP-Bang Tool during a presurgical visit.  A score of 4 or greater is considered an elevated risk.   

## 2012-08-28 NOTE — Progress Notes (Signed)
08/28/12 0955  OBSTRUCTIVE SLEEP APNEA  Have you ever been diagnosed with sleep apnea through a sleep study? No  Do you snore loudly (loud enough to be heard through closed doors)?  0  Do you often feel tired, fatigued, or sleepy during the daytime? 0  Has anyone observed you stop breathing during your sleep? 0  Do you have, or are you being treated for high blood pressure? 1  BMI more than 35 kg/m2? 1  Age over 62 years old? 1  Neck circumference greater than 40 cm/18 inches? 0  Gender: 1  Obstructive Sleep Apnea Score 4

## 2012-09-03 ENCOUNTER — Other Ambulatory Visit: Payer: Self-pay | Admitting: Orthopedic Surgery

## 2012-09-03 NOTE — H&P (Signed)
Samuel Chambers  DOB: 1951-07-01 Married / Language: English / Race: White Male  Date of Admission:  09/04/2012  Chief Complaint:  Right Hip Pain  History of Present Illness The patient is a 62 year old male who comes in for a preoperative History and Physical. The patient is scheduled for a left total knee arthroplasty to be performed by Dr. Gus Rankin. Aluisio, MD at [facility] on 09/04/2012. The patient currently describe their pain as severe.The patient states that the swelling is moderate at this time. The patient feels that they are progressing poorly at this time. He said the knee continues to be painful and continues to swell. His exercises are causing the knee to swell and pain. He is concerned that he needs to get back to work. He had his knee scope at the end of August. He has been seen several times since his knee scope and comes back in today. He continues to have pain which is quite severe at times when he can hardly even put weight on the leg. It continues to swell. He did his outpatient therapy as instructed by Dr. Lequita Halt, and he felt like that even aggravated the knee more which caused increased pain and swelling. Patient stated he had to have something done about this. He is in significant pain. He has definitely had some changes since July before surgery until now and continues to have pain. The patient feels that they are doing poorly and report their pain level to be severe. Current treatment includes: pain medications. The following medication has been used for pain control: Oxycodone. He states he can no longer tolerate the pain. It is very intense and happening at all times. It is limiting what he can and cannot do. He is ready to proceed with total knee replacement. They have been treated conservatively in the past for the above stated problem and despite conservative measures, they continue to have progressive pain and severe functional limitations and  dysfunction. They have failed non-operative management including home exercise, medications, and injections. It is felt that they would benefit from undergoing total joint replacement. Risks and benefits of the procedure have been discussed with the patient and they elect to proceed with surgery. There are no active contraindications to surgery such as ongoing infection or rapidly progressive neurological disease.   Problem List Osteoarthritis, Knee (715.96)  Allergies Coumadin *ANTICOAGULANTS*   Family History Cancer. mother and father Diabetes Mellitus. mother and brother Cerebrovascular Accident. mother and father Heart Disease. father and brother Hypertension. father and brother   Social History Living situation. live with spouse Illicit drug use. no Most recent primary occupation. Chiropodist Marital status. married Drug/Alcohol Rehab (Currently). no Current work status. working full time Exercise. Exercises weekly; does other Drug/Alcohol Rehab (Previously). no Number of flights of stairs before winded. 2-3 Previously in rehab. no Pain Contract. yes Tobacco use. Never smoker. never smoker Tobacco / smoke exposure. no Children. 1 Alcohol use. current drinker; drinks beer; only occasionally per week   Medication History Cymbalta (60MG  Capsule DR Part, Oral daily) Active. Losartan Potassium (50MG  Tablet, 1 1/2 Oral daily) Active. Metoprolol Succinate ER (50MG  Tablet ER 24HR, Oral daily) Active. Clopidogrel Bisulfate (75MG  Tablet, Oral daily) Active. Aspirin EC (81MG  Tablet DR, Oral daily) Active. Fish Oil Burp-Less ( Oral) Specific dose unknown - Active.   Past Surgical History Total Hip Replacement. left Colon Polyp Removal - Colonoscopy Arthroscopy of Knee. left Heart Stents Foot Surgery. right   Medical History Hypercholesterolemia  High blood pressure Coronary artery disease Gastroesophageal Reflux  Disease Fibromyalgia   Review of Systems General:Not Present- Chills, Fever, Night Sweats, Appetite Loss, Fatigue, Feeling sick, Weight Gain and Weight Loss. Skin:Not Present- Itching, Rash, Skin Color Changes, Ulcer, Psoriasis and Change in Hair or Nails. HEENT:Not Present- Sensitivity to light, Hearing problems, Nose Bleed, Visual Loss, Decreased Hearing and Ringing in the Ears. Neck:Not Present- Swollen Glands and Neck Mass. Respiratory:Not Present- Shortness of breath, Snoring, Chronic Cough, Bloody sputum and Dyspnea. Cardiovascular:Not Present- Shortness of Breath, Chest Pain, Swelling of Extremities, Leg Cramps and Palpitations. Gastrointestinal:Present- Heartburn. Not Present- Bloody Stool, Abdominal Pain, Vomiting, Nausea and Incontinence of Stool. Male Genitourinary:Not Present- Blood in Urine, Frequency, Incontinence and Nocturia. Musculoskeletal:Present- Joint Stiffness, Joint Swelling and Joint Pain. Not Present- Muscle Weakness, Muscle Pain and Back Pain. Neurological:Not Present- Tingling, Numbness, Burning, Tremor, Headaches and Dizziness. Psychiatric:Not Present- Anxiety, Depression and Memory Loss. Endocrine:Not Present- Cold Intolerance, Heat Intolerance, Excessive hunger and Excessive Thirst. Hematology:Not Present- Abnormal Bleeding, Abnormal bruising, Anemia, Blood Clots and Easy Bruising.   Vitals Weight: 272 lb Height: 70.5 in Body Surface Area: 2.48 m Body Mass Index: 38.48 kg/m Pulse: 88 (Regular) Resp.: 14 (Unlabored) BP: 152/92 (Sitting, Right Arm, Standard)    Physical Exam The physical exam findings are as follows:  Note: Patient is a 62 year old male with continued knee pain.   General Mental Status - Alert, cooperative and good historian. General Appearance- pleasant. Not in acute distress. Orientation- Oriented X3. Build & Nutrition- Well nourished and Well developed.   Head and Neck Head- normocephalic,  atraumatic . Neck Global Assessment- supple. no bruit auscultated on the right and no bruit auscultated on the left.   Eye Vision- Wears corrective lenses. Pupil- Bilateral- Regular and Round. Motion- Bilateral- EOMI.   Chest and Lung Exam Auscultation: Breath sounds:- clear at anterior chest wall and - clear at posterior chest wall. Adventitious sounds:- No Adventitious sounds.   Cardiovascular Auscultation:Rhythm- Regular rate and rhythm. Heart Sounds- S1 WNL and S2 WNL. Murmurs & Other Heart Sounds:Auscultation of the heart reveals - No Murmurs.   Abdomen Palpation/Percussion:Tenderness- Abdomen is non-tender to palpation. Rigidity (guarding)- Abdomen is soft. Auscultation:Auscultation of the abdomen reveals - Bowel sounds normal.   Male Genitourinary Not done, not pertinent to present illness  Musculoskeletal On exam of the left knee, he has moderate patellofemoral crepitus noted. He is a lot more tender over the medial joint line vs. the lateral joint line. He does have a moderate effusion noted. I do not see any erythema or ecchymosis. No cellulitic changes are appreciated.  RADIOGRAPHS: AP and lateral views of the knee are taken today. AP standing view shows some narrowing in the medial joint space on the AP view. On the lateral view, it confirms he has significant narrowing with near bone on bone changes now. Medially he also has some significant narrowing of the patellofemoral area with some patellofemoral spurring. The lateral view today, on 05-15-12, is compared to the lateral view taken back in July, and it shows some definite progression and wearing in the joint space. These changes are pointed out to the patient and gone over with him in detail.  Assessment & Plan Osteoarthritis, Knee (715.96) Impression: Left Knee  Note: Plan is for a Left Total Knee Replacement by Dr. Lequita Halt.  Plan is to go home.  Please note that the  patient has experienced some nausea with anesthesia in the past.  PCP - Dr. Earl Gala - Patient has been seen preoperatively  and felt to be stable for surgery. Cardiology - Dr. Herbie Baltimore - Patient has been seen preoperatively and felt to be stable for surgery.  Signed electronically by Roberts Gaudy, PA-C

## 2012-09-04 ENCOUNTER — Encounter (HOSPITAL_COMMUNITY): Payer: Self-pay | Admitting: Anesthesiology

## 2012-09-04 ENCOUNTER — Ambulatory Visit (HOSPITAL_COMMUNITY): Payer: Managed Care, Other (non HMO) | Admitting: Anesthesiology

## 2012-09-04 ENCOUNTER — Inpatient Hospital Stay (HOSPITAL_COMMUNITY)
Admission: RE | Admit: 2012-09-04 | Discharge: 2012-09-06 | DRG: 470 | Disposition: A | Discharge status: Home Health | Payer: Managed Care, Other (non HMO) | Source: Ambulatory Visit | Attending: Orthopedic Surgery | Admitting: Orthopedic Surgery

## 2012-09-04 ENCOUNTER — Encounter (HOSPITAL_COMMUNITY): Payer: Self-pay | Admitting: Orthopedic Surgery

## 2012-09-04 ENCOUNTER — Encounter (HOSPITAL_COMMUNITY): Payer: Self-pay | Admitting: *Deleted

## 2012-09-04 ENCOUNTER — Encounter (HOSPITAL_COMMUNITY)
Admission: RE | Disposition: A | Discharge status: Home Health | Payer: Self-pay | Source: Ambulatory Visit | Attending: Orthopedic Surgery

## 2012-09-04 DIAGNOSIS — M179 Osteoarthritis of knee, unspecified: Secondary | ICD-10-CM | POA: Diagnosis present

## 2012-09-04 DIAGNOSIS — Z96659 Presence of unspecified artificial knee joint: Secondary | ICD-10-CM

## 2012-09-04 DIAGNOSIS — I10 Essential (primary) hypertension: Secondary | ICD-10-CM | POA: Diagnosis present

## 2012-09-04 DIAGNOSIS — K219 Gastro-esophageal reflux disease without esophagitis: Secondary | ICD-10-CM | POA: Diagnosis present

## 2012-09-04 DIAGNOSIS — E669 Obesity, unspecified: Secondary | ICD-10-CM | POA: Diagnosis present

## 2012-09-04 DIAGNOSIS — M1710 Unilateral primary osteoarthritis, unspecified knee: Principal | ICD-10-CM | POA: Diagnosis present

## 2012-09-04 HISTORY — PX: TOTAL KNEE ARTHROPLASTY: SHX125

## 2012-09-04 LAB — TYPE AND SCREEN: Antibody Screen: NEGATIVE

## 2012-09-04 SURGERY — ARTHROPLASTY, KNEE, TOTAL
Anesthesia: General | Site: Knee | Laterality: Left | Wound class: Clean

## 2012-09-04 MED ORDER — METHOCARBAMOL 500 MG PO TABS
500.0000 mg | ORAL_TABLET | Freq: Four times a day (QID) | ORAL | Status: DC | PRN
  Administered 2012-09-04 – 2012-09-05 (×3): 500 mg via ORAL
  Filled 2012-09-04 (×3): qty 1

## 2012-09-04 MED ORDER — METOCLOPRAMIDE HCL 10 MG PO TABS
5.0000 mg | ORAL_TABLET | Freq: Three times a day (TID) | ORAL | Status: DC | PRN
  Administered 2012-09-05: 10 mg via ORAL
  Filled 2012-09-04: qty 1

## 2012-09-04 MED ORDER — CEFAZOLIN SODIUM 1-5 GM-% IV SOLN
INTRAVENOUS | Status: AC
  Filled 2012-09-04: qty 50

## 2012-09-04 MED ORDER — DULOXETINE HCL 60 MG PO CPEP
60.0000 mg | ORAL_CAPSULE | Freq: Every morning | ORAL | Status: DC
  Administered 2012-09-05: 60 mg via ORAL
  Filled 2012-09-04 (×2): qty 1

## 2012-09-04 MED ORDER — CISATRACURIUM BESYLATE (PF) 10 MG/5ML IV SOLN
INTRAVENOUS | Status: DC | PRN
  Administered 2012-09-04: 5 mg via INTRAVENOUS

## 2012-09-04 MED ORDER — CEFAZOLIN SODIUM-DEXTROSE 2-3 GM-% IV SOLR
INTRAVENOUS | Status: AC
  Filled 2012-09-04: qty 50

## 2012-09-04 MED ORDER — POLYETHYLENE GLYCOL 3350 17 G PO PACK: 17.0000 g | PACK | Freq: Every day | ORAL | Status: DC | PRN

## 2012-09-04 MED ORDER — GLYCOPYRROLATE 0.2 MG/ML IJ SOLN
INTRAMUSCULAR | Status: DC | PRN
  Administered 2012-09-04: .6 mg via INTRAVENOUS

## 2012-09-04 MED ORDER — LACTATED RINGERS IV SOLN
INTRAVENOUS | Status: DC | PRN
  Administered 2012-09-04 (×2): via INTRAVENOUS

## 2012-09-04 MED ORDER — BUPIVACAINE LIPOSOME 1.3 % IJ SUSP
20.0000 mL | Freq: Once | INTRAMUSCULAR | Status: AC
  Administered 2012-09-04: 20 mL
  Filled 2012-09-04: qty 20

## 2012-09-04 MED ORDER — OXYCODONE HCL 5 MG PO TABS
5.0000 mg | ORAL_TABLET | ORAL | Status: DC | PRN
  Administered 2012-09-04: 10 mg via ORAL
  Administered 2012-09-04: 15 mg via ORAL
  Administered 2012-09-04: 5 mg via ORAL
  Administered 2012-09-04 – 2012-09-05 (×3): 10 mg via ORAL
  Administered 2012-09-05: 15 mg via ORAL
  Administered 2012-09-05 (×4): 10 mg via ORAL
  Administered 2012-09-06 (×2): 15 mg via ORAL
  Filled 2012-09-04: qty 3
  Filled 2012-09-04 (×2): qty 2
  Filled 2012-09-04: qty 3
  Filled 2012-09-04: qty 1
  Filled 2012-09-04: qty 2
  Filled 2012-09-04: qty 3
  Filled 2012-09-04 (×2): qty 2
  Filled 2012-09-04: qty 3
  Filled 2012-09-04 (×2): qty 2
  Filled 2012-09-04: qty 4

## 2012-09-04 MED ORDER — DEXAMETHASONE SODIUM PHOSPHATE 10 MG/ML IJ SOLN
10.0000 mg | Freq: Once | INTRAMUSCULAR | Status: DC
Start: 2012-09-05 — End: 2012-09-05

## 2012-09-04 MED ORDER — HYDROMORPHONE HCL PF 1 MG/ML IJ SOLN: 0.2500 mg | INTRAMUSCULAR | Status: DC | PRN

## 2012-09-04 MED ORDER — CEFAZOLIN SODIUM-DEXTROSE 2-3 GM-% IV SOLR
2.0000 g | Freq: Four times a day (QID) | INTRAVENOUS | Status: AC
  Administered 2012-09-04 (×2): 2 g via INTRAVENOUS
  Filled 2012-09-04 (×2): qty 50

## 2012-09-04 MED ORDER — LIDOCAINE HCL (CARDIAC) 20 MG/ML IV SOLN
INTRAVENOUS | Status: DC | PRN
  Administered 2012-09-04: 100 mg via INTRAVENOUS

## 2012-09-04 MED ORDER — SUCCINYLCHOLINE CHLORIDE 20 MG/ML IJ SOLN
INTRAMUSCULAR | Status: DC | PRN
  Administered 2012-09-04: 100 mg via INTRAVENOUS

## 2012-09-04 MED ORDER — CEFAZOLIN SODIUM 10 G IJ SOLR
3.0000 g | INTRAMUSCULAR | Status: AC
  Administered 2012-09-04: 3 g via INTRAVENOUS
  Filled 2012-09-04: qty 3000

## 2012-09-04 MED ORDER — ACETAMINOPHEN 650 MG RE SUPP: 650.0000 mg | Freq: Four times a day (QID) | RECTAL | Status: DC | PRN

## 2012-09-04 MED ORDER — SALINE FLUSH 0.9 % IV SOLN
INTRAVENOUS | Status: DC | PRN
  Administered 2012-09-04: 50 mL via INTRAMUSCULAR

## 2012-09-04 MED ORDER — SODIUM CHLORIDE 0.9 % IR SOLN
Status: DC | PRN
  Administered 2012-09-04: 1000 mL

## 2012-09-04 MED ORDER — RIVAROXABAN 10 MG PO TABS
10.0000 mg | ORAL_TABLET | Freq: Every day | ORAL | Status: DC
  Administered 2012-09-05 – 2012-09-06 (×2): 10 mg via ORAL
  Filled 2012-09-04 (×4): qty 1

## 2012-09-04 MED ORDER — ONDANSETRON HCL 4 MG/2ML IJ SOLN: 4.0000 mg | Freq: Four times a day (QID) | INTRAMUSCULAR | Status: DC | PRN

## 2012-09-04 MED ORDER — MIDAZOLAM HCL 5 MG/5ML IJ SOLN
INTRAMUSCULAR | Status: DC | PRN
  Administered 2012-09-04: 2 mg via INTRAVENOUS

## 2012-09-04 MED ORDER — NEOSTIGMINE METHYLSULFATE 1 MG/ML IJ SOLN
INTRAMUSCULAR | Status: DC | PRN
  Administered 2012-09-04: 4 mg via INTRAVENOUS

## 2012-09-04 MED ORDER — TRAMADOL HCL 50 MG PO TABS: 50.0000 mg | ORAL_TABLET | Freq: Four times a day (QID) | ORAL | Status: DC | PRN

## 2012-09-04 MED ORDER — MORPHINE SULFATE 2 MG/ML IJ SOLN
1.0000 mg | INTRAMUSCULAR | Status: DC | PRN
  Administered 2012-09-04 – 2012-09-05 (×2): 2 mg via INTRAVENOUS
  Filled 2012-09-04 (×3): qty 1

## 2012-09-04 MED ORDER — CHLORHEXIDINE GLUCONATE 4 % EX LIQD
60.0000 mL | Freq: Once | CUTANEOUS | Status: DC
  Filled 2012-09-04: qty 60

## 2012-09-04 MED ORDER — KETAMINE HCL 10 MG/ML IJ SOLN
INTRAMUSCULAR | Status: DC | PRN
  Administered 2012-09-04: 20 mg via INTRAVENOUS
  Administered 2012-09-04: 30 mg via INTRAVENOUS

## 2012-09-04 MED ORDER — PROMETHAZINE HCL 25 MG/ML IJ SOLN: 6.2500 mg | INTRAMUSCULAR | Status: DC | PRN

## 2012-09-04 MED ORDER — MENTHOL 3 MG MT LOZG: 1.0000 | LOZENGE | OROMUCOSAL | Status: DC | PRN

## 2012-09-04 MED ORDER — LOSARTAN POTASSIUM 50 MG PO TABS: 100.0000 mg | ORAL_TABLET | Freq: Every morning | ORAL | Status: DC

## 2012-09-04 MED ORDER — METOPROLOL SUCCINATE ER 50 MG PO TB24
50.0000 mg | ORAL_TABLET | Freq: Every morning | ORAL | Status: DC
Start: 2012-09-05 — End: 2012-09-06
  Administered 2012-09-05: 50 mg via ORAL
  Filled 2012-09-04 (×2): qty 1

## 2012-09-04 MED ORDER — LOSARTAN POTASSIUM 50 MG PO TABS
100.0000 mg | ORAL_TABLET | Freq: Every day | ORAL | Status: DC
  Administered 2012-09-04 – 2012-09-05 (×2): 100 mg via ORAL
  Filled 2012-09-04 (×3): qty 2

## 2012-09-04 MED ORDER — ACETAMINOPHEN 10 MG/ML IV SOLN
1000.0000 mg | Freq: Once | INTRAVENOUS | Status: AC
  Administered 2012-09-04: 1000 mg via INTRAVENOUS

## 2012-09-04 MED ORDER — FLEET ENEMA 7-19 GM/118ML RE ENEM: 1.0000 | ENEMA | Freq: Once | RECTAL | Status: AC | PRN

## 2012-09-04 MED ORDER — SODIUM CHLORIDE 0.9 % IV SOLN: INTRAVENOUS | Status: DC

## 2012-09-04 MED ORDER — PROPOFOL 10 MG/ML IV BOLUS
INTRAVENOUS | Status: DC | PRN
  Administered 2012-09-04: 250 mg via INTRAVENOUS

## 2012-09-04 MED ORDER — DEXAMETHASONE SODIUM PHOSPHATE 10 MG/ML IJ SOLN
INTRAMUSCULAR | Status: DC | PRN
  Administered 2012-09-04: 10 mg via INTRAVENOUS

## 2012-09-04 MED ORDER — ACETAMINOPHEN 10 MG/ML IV SOLN
INTRAVENOUS | Status: AC
  Filled 2012-09-04: qty 100

## 2012-09-04 MED ORDER — FAMOTIDINE 20 MG PO TABS
20.0000 mg | ORAL_TABLET | Freq: Every day | ORAL | Status: DC
  Administered 2012-09-05: 20 mg via ORAL
  Filled 2012-09-04 (×2): qty 1

## 2012-09-04 MED ORDER — METHOCARBAMOL 100 MG/ML IJ SOLN: 500.0000 mg | Freq: Four times a day (QID) | INTRAVENOUS | Status: DC | PRN

## 2012-09-04 MED ORDER — POTASSIUM CHLORIDE IN NACL 20-0.9 MEQ/L-% IV SOLN
INTRAVENOUS | Status: DC
  Administered 2012-09-04: 100 mL/h via INTRAVENOUS
  Administered 2012-09-04 – 2012-09-05 (×2): via INTRAVENOUS
  Filled 2012-09-04 (×3): qty 1000

## 2012-09-04 MED ORDER — POTASSIUM CHLORIDE IN NACL 20-0.9 MEQ/L-% IV SOLN
INTRAVENOUS | Status: AC
  Administered 2012-09-04: 100 mL/h via INTRAVENOUS
  Filled 2012-09-04: qty 1000

## 2012-09-04 MED ORDER — DIPHENHYDRAMINE HCL 12.5 MG/5ML PO ELIX: 12.5000 mg | ORAL_SOLUTION | ORAL | Status: DC | PRN

## 2012-09-04 MED ORDER — LABETALOL HCL 5 MG/ML IV SOLN
INTRAVENOUS | Status: DC | PRN
  Administered 2012-09-04 (×2): 5 mg via INTRAVENOUS

## 2012-09-04 MED ORDER — ACETAMINOPHEN 325 MG PO TABS: 650.0000 mg | ORAL_TABLET | Freq: Four times a day (QID) | ORAL | Status: DC | PRN

## 2012-09-04 MED ORDER — ONDANSETRON HCL 4 MG/2ML IJ SOLN
INTRAMUSCULAR | Status: DC | PRN
  Administered 2012-09-04: 4 mg via INTRAVENOUS

## 2012-09-04 MED ORDER — METOCLOPRAMIDE HCL 5 MG/ML IJ SOLN: 5.0000 mg | Freq: Three times a day (TID) | INTRAMUSCULAR | Status: DC | PRN

## 2012-09-04 MED ORDER — RIVAROXABAN 10 MG PO TABS: 10.0000 mg | ORAL_TABLET | Freq: Every day | ORAL | Status: DC

## 2012-09-04 MED ORDER — PHENOL 1.4 % MT LIQD: 1.0000 | OROMUCOSAL | Status: DC | PRN

## 2012-09-04 MED ORDER — BUPIVACAINE ON-Q PAIN PUMP (FOR ORDER SET NO CHG)
INJECTION | Status: DC
  Filled 2012-09-04: qty 1

## 2012-09-04 MED ORDER — ACETAMINOPHEN 10 MG/ML IV SOLN
1000.0000 mg | Freq: Four times a day (QID) | INTRAVENOUS | Status: AC
  Administered 2012-09-04 (×3): 1000 mg via INTRAVENOUS
  Filled 2012-09-04 (×7): qty 100

## 2012-09-04 MED ORDER — LACTATED RINGERS IV SOLN: INTRAVENOUS | Status: DC

## 2012-09-04 MED ORDER — HYDROMORPHONE HCL PF 1 MG/ML IJ SOLN
INTRAMUSCULAR | Status: DC | PRN
  Administered 2012-09-04: .4 mg via INTRAVENOUS

## 2012-09-04 MED ORDER — DOCUSATE SODIUM 100 MG PO CAPS
100.0000 mg | ORAL_CAPSULE | Freq: Two times a day (BID) | ORAL | Status: DC
  Administered 2012-09-04 – 2012-09-05 (×4): 100 mg via ORAL

## 2012-09-04 MED ORDER — BISACODYL 10 MG RE SUPP: 10.0000 mg | Freq: Every day | RECTAL | Status: DC | PRN

## 2012-09-04 MED ORDER — DEXAMETHASONE 6 MG PO TABS
10.0000 mg | ORAL_TABLET | Freq: Once | ORAL | Status: DC
  Filled 2012-09-04: qty 1

## 2012-09-04 MED ORDER — SUFENTANIL CITRATE 50 MCG/ML IV SOLN
INTRAVENOUS | Status: DC | PRN
  Administered 2012-09-04: 20 ug via INTRAVENOUS
  Administered 2012-09-04 (×3): 10 ug via INTRAVENOUS

## 2012-09-04 MED ORDER — ONDANSETRON HCL 4 MG PO TABS
4.0000 mg | ORAL_TABLET | Freq: Four times a day (QID) | ORAL | Status: DC | PRN
  Administered 2012-09-04: 4 mg via ORAL
  Filled 2012-09-04: qty 1

## 2012-09-04 SURGICAL SUPPLY — 53 items
BAG ZIPLOCK 12X15 (MISCELLANEOUS) ×2 IMPLANT
BANDAGE ELASTIC 6 VELCRO ST LF (GAUZE/BANDAGES/DRESSINGS) ×2 IMPLANT
BANDAGE ESMARK 6X9 LF (GAUZE/BANDAGES/DRESSINGS) ×1 IMPLANT
BLADE SAG 18X100X1.27 (BLADE) ×2 IMPLANT
BLADE SAW SGTL 11.0X1.19X90.0M (BLADE) ×2 IMPLANT
BNDG ESMARK 6X9 LF (GAUZE/BANDAGES/DRESSINGS) ×2
BOWL SMART MIX CTS (DISPOSABLE) ×2 IMPLANT
CATH KIT ON-Q SILVERSOAK 5IN (CATHETERS) IMPLANT
CEMENT HV SMART SET (Cement) ×4 IMPLANT
CLOTH BEACON ORANGE TIMEOUT ST (SAFETY) ×2 IMPLANT
CLSR STERI-STRIP ANTIMIC 1/2X4 (GAUZE/BANDAGES/DRESSINGS) ×2 IMPLANT
CUFF TOURN SGL QUICK 34 (TOURNIQUET CUFF) ×1
CUFF TRNQT CYL 34X4X40X1 (TOURNIQUET CUFF) ×1 IMPLANT
DRAPE EXTREMITY T 121X128X90 (DRAPE) ×2 IMPLANT
DRAPE POUCH INSTRU U-SHP 10X18 (DRAPES) ×2 IMPLANT
DRAPE U-SHAPE 47X51 STRL (DRAPES) ×2 IMPLANT
DRSG ADAPTIC 3X8 NADH LF (GAUZE/BANDAGES/DRESSINGS) ×2 IMPLANT
DURAPREP 26ML APPLICATOR (WOUND CARE) ×2 IMPLANT
ELECT REM PT RETURN 9FT ADLT (ELECTROSURGICAL) ×2
ELECTRODE REM PT RTRN 9FT ADLT (ELECTROSURGICAL) ×1 IMPLANT
EVACUATOR 1/8 PVC DRAIN (DRAIN) ×2 IMPLANT
FACESHIELD LNG OPTICON STERILE (SAFETY) ×10 IMPLANT
GLOVE BIO SURGEON STRL SZ7.5 (GLOVE) ×2 IMPLANT
GLOVE BIO SURGEON STRL SZ8 (GLOVE) ×2 IMPLANT
GLOVE BIOGEL PI IND STRL 8 (GLOVE) ×2 IMPLANT
GLOVE BIOGEL PI INDICATOR 8 (GLOVE) ×2
GLOVE SURG SS PI 6.5 STRL IVOR (GLOVE) ×4 IMPLANT
GOWN STRL NON-REIN LRG LVL3 (GOWN DISPOSABLE) ×4 IMPLANT
GOWN STRL REIN XL XLG (GOWN DISPOSABLE) ×2 IMPLANT
HANDPIECE INTERPULSE COAX TIP (DISPOSABLE) ×1
IMMOBILIZER KNEE 20 (SOFTGOODS) ×2
IMMOBILIZER KNEE 20 THIGH 36 (SOFTGOODS) ×1 IMPLANT
KIT BASIN OR (CUSTOM PROCEDURE TRAY) ×2 IMPLANT
MANIFOLD NEPTUNE II (INSTRUMENTS) ×2 IMPLANT
NEEDLE 27GAX1X1/2 (NEEDLE) ×2 IMPLANT
NS IRRIG 1000ML POUR BTL (IV SOLUTION) ×2 IMPLANT
PACK TOTAL JOINT (CUSTOM PROCEDURE TRAY) ×2 IMPLANT
PAD ABD 7.5X8 STRL (GAUZE/BANDAGES/DRESSINGS) ×2 IMPLANT
PADDING CAST COTTON 6X4 STRL (CAST SUPPLIES) ×2 IMPLANT
POSITIONER SURGICAL ARM (MISCELLANEOUS) ×2 IMPLANT
SET HNDPC FAN SPRY TIP SCT (DISPOSABLE) ×1 IMPLANT
SPONGE GAUZE 4X4 12PLY (GAUZE/BANDAGES/DRESSINGS) ×2 IMPLANT
STRIP CLOSURE SKIN 1/2X4 (GAUZE/BANDAGES/DRESSINGS) ×4 IMPLANT
SUCTION FRAZIER 12FR DISP (SUCTIONS) ×2 IMPLANT
SUT MNCRL AB 4-0 PS2 18 (SUTURE) ×2 IMPLANT
SUT VIC AB 2-0 CT1 27 (SUTURE) ×4
SUT VIC AB 2-0 CT1 TAPERPNT 27 (SUTURE) ×4 IMPLANT
SUT VLOC 180 0 24IN GS25 (SUTURE) ×2 IMPLANT
SYR 50ML LL SCALE MARK (SYRINGE) ×2 IMPLANT
TOWEL OR 17X26 10 PK STRL BLUE (TOWEL DISPOSABLE) ×4 IMPLANT
TRAY FOLEY CATH 14FRSI W/METER (CATHETERS) ×2 IMPLANT
WATER STERILE IRR 1500ML POUR (IV SOLUTION) ×2 IMPLANT
WRAP KNEE MAXI GEL POST OP (GAUZE/BANDAGES/DRESSINGS) ×4 IMPLANT

## 2012-09-04 NOTE — Preoperative (Signed)
Beta Blockers   Reason not to administer Beta Blockers:Not Applicable 

## 2012-09-04 NOTE — H&P (View-Only) (Signed)
Samuel Chambers  DOB: 04/13/1951 Married / Language: English / Race: White Male  Date of Admission:  09/04/2012  Chief Complaint:  Right Hip Pain  History of Present Illness The patient is a 62 year old male who comes in for a preoperative History and Physical. The patient is scheduled for a left total knee arthroplasty to be performed by Dr. Frank V. Aluisio, MD at [facility] on 09/04/2012. The patient currently describe their pain as severe.The patient states that the swelling is moderate at this time. The patient feels that they are progressing poorly at this time. He said the knee continues to be painful and continues to swell. His exercises are causing the knee to swell and pain. He is concerned that he needs to get back to work. He had his knee scope at the end of August. He has been seen several times since his knee scope and comes back in today. He continues to have pain which is quite severe at times when he can hardly even put weight on the leg. It continues to swell. He did his outpatient therapy as instructed by Dr. Aluisio, and he felt like that even aggravated the knee more which caused increased pain and swelling. Patient stated he had to have something done about this. He is in significant pain. He has definitely had some changes since July before surgery until now and continues to have pain. The patient feels that they are doing poorly and report their pain level to be severe. Current treatment includes: pain medications. The following medication has been used for pain control: Oxycodone. He states he can no longer tolerate the pain. It is very intense and happening at all times. It is limiting what he can and cannot do. He is ready to proceed with total knee replacement. They have been treated conservatively in the past for the above stated problem and despite conservative measures, they continue to have progressive pain and severe functional limitations and  dysfunction. They have failed non-operative management including home exercise, medications, and injections. It is felt that they would benefit from undergoing total joint replacement. Risks and benefits of the procedure have been discussed with the patient and they elect to proceed with surgery. There are no active contraindications to surgery such as ongoing infection or rapidly progressive neurological disease.   Problem List Osteoarthritis, Knee (715.96)  Allergies Coumadin *ANTICOAGULANTS*   Family History Cancer. mother and father Diabetes Mellitus. mother and brother Cerebrovascular Accident. mother and father Heart Disease. father and brother Hypertension. father and brother   Social History Living situation. live with spouse Illicit drug use. no Most recent primary occupation. forklift mechanic Marital status. married Drug/Alcohol Rehab (Currently). no Current work status. working full time Exercise. Exercises weekly; does other Drug/Alcohol Rehab (Previously). no Number of flights of stairs before winded. 2-3 Previously in rehab. no Pain Contract. yes Tobacco use. Never smoker. never smoker Tobacco / smoke exposure. no Children. 1 Alcohol use. current drinker; drinks beer; only occasionally per week   Medication History Cymbalta (60MG Capsule DR Part, Oral daily) Active. Losartan Potassium (50MG Tablet, 1 1/2 Oral daily) Active. Metoprolol Succinate ER (50MG Tablet ER 24HR, Oral daily) Active. Clopidogrel Bisulfate (75MG Tablet, Oral daily) Active. Aspirin EC (81MG Tablet DR, Oral daily) Active. Fish Oil Burp-Less ( Oral) Specific dose unknown - Active.   Past Surgical History Total Hip Replacement. left Colon Polyp Removal - Colonoscopy Arthroscopy of Knee. left Heart Stents Foot Surgery. right   Medical History Hypercholesterolemia   High blood pressure Coronary artery disease Gastroesophageal Reflux  Disease Fibromyalgia   Review of Systems General:Not Present- Chills, Fever, Night Sweats, Appetite Loss, Fatigue, Feeling sick, Weight Gain and Weight Loss. Skin:Not Present- Itching, Rash, Skin Color Changes, Ulcer, Psoriasis and Change in Hair or Nails. HEENT:Not Present- Sensitivity to light, Hearing problems, Nose Bleed, Visual Loss, Decreased Hearing and Ringing in the Ears. Neck:Not Present- Swollen Glands and Neck Mass. Respiratory:Not Present- Shortness of breath, Snoring, Chronic Cough, Bloody sputum and Dyspnea. Cardiovascular:Not Present- Shortness of Breath, Chest Pain, Swelling of Extremities, Leg Cramps and Palpitations. Gastrointestinal:Present- Heartburn. Not Present- Bloody Stool, Abdominal Pain, Vomiting, Nausea and Incontinence of Stool. Male Genitourinary:Not Present- Blood in Urine, Frequency, Incontinence and Nocturia. Musculoskeletal:Present- Joint Stiffness, Joint Swelling and Joint Pain. Not Present- Muscle Weakness, Muscle Pain and Back Pain. Neurological:Not Present- Tingling, Numbness, Burning, Tremor, Headaches and Dizziness. Psychiatric:Not Present- Anxiety, Depression and Memory Loss. Endocrine:Not Present- Cold Intolerance, Heat Intolerance, Excessive hunger and Excessive Thirst. Hematology:Not Present- Abnormal Bleeding, Abnormal bruising, Anemia, Blood Clots and Easy Bruising.   Vitals Weight: 272 lb Height: 70.5 in Body Surface Area: 2.48 m Body Mass Index: 38.48 kg/m Pulse: 88 (Regular) Resp.: 14 (Unlabored) BP: 152/92 (Sitting, Right Arm, Standard)    Physical Exam The physical exam findings are as follows:  Note: Patient is a 62 year old male with continued knee pain.   General Mental Status - Alert, cooperative and good historian. General Appearance- pleasant. Not in acute distress. Orientation- Oriented X3. Build & Nutrition- Well nourished and Well developed.   Head and Neck Head- normocephalic,  atraumatic . Neck Global Assessment- supple. no bruit auscultated on the right and no bruit auscultated on the left.   Eye Vision- Wears corrective lenses. Pupil- Bilateral- Regular and Round. Motion- Bilateral- EOMI.   Chest and Lung Exam Auscultation: Breath sounds:- clear at anterior chest wall and - clear at posterior chest wall. Adventitious sounds:- No Adventitious sounds.   Cardiovascular Auscultation:Rhythm- Regular rate and rhythm. Heart Sounds- S1 WNL and S2 WNL. Murmurs & Other Heart Sounds:Auscultation of the heart reveals - No Murmurs.   Abdomen Palpation/Percussion:Tenderness- Abdomen is non-tender to palpation. Rigidity (guarding)- Abdomen is soft. Auscultation:Auscultation of the abdomen reveals - Bowel sounds normal.   Male Genitourinary Not done, not pertinent to present illness  Musculoskeletal On exam of the left knee, he has moderate patellofemoral crepitus noted. He is a lot more tender over the medial joint line vs. the lateral joint line. He does have a moderate effusion noted. I do not see any erythema or ecchymosis. No cellulitic changes are appreciated.  RADIOGRAPHS: AP and lateral views of the knee are taken today. AP standing view shows some narrowing in the medial joint space on the AP view. On the lateral view, it confirms he has significant narrowing with near bone on bone changes now. Medially he also has some significant narrowing of the patellofemoral area with some patellofemoral spurring. The lateral view today, on 05-15-12, is compared to the lateral view taken back in July, and it shows some definite progression and wearing in the joint space. These changes are pointed out to the patient and gone over with him in detail.  Assessment & Plan Osteoarthritis, Knee (715.96) Impression: Left Knee  Note: Plan is for a Left Total Knee Replacement by Dr. Aluisio.  Plan is to go home.  Please note that the  patient has experienced some nausea with anesthesia in the past.  PCP - Dr. Osborne - Patient has been seen preoperatively   and felt to be stable for surgery. Cardiology - Dr. Harding - Patient has been seen preoperatively and felt to be stable for surgery.  Signed electronically by DREW L Jori Frerichs, PA-C  

## 2012-09-04 NOTE — Op Note (Signed)
Pre-operative diagnosis- Osteoarthritis  Left knee(s)  Post-operative diagnosis- Osteoarthritis Left knee(s)  Procedure-  Left  Total Knee Arthroplasty  Surgeon- Samuel Chambers. Samuel Deford, MD  Assistant- Samuel Peace, PA-C   Anesthesia-  General EBL-* No blood loss amount entered *  Drains Hemovac  Tourniquet time-  36 minutes @300  mm Hg  Complications- None  Condition-PACU - hemodynamically stable.   Brief Clinical Note   Samuel Chambers is a 62 y.o. year old male with end stage OA of his left knee with progressively worsening pain and dysfunction. He has constant pain, with activity and at rest and significant functional deficits with difficulties even with ADLs. He has had extensive non-op management including analgesics, injections of cortisone and viscosupplements, and home exercise program, but remains in significant pain with significant dysfunction. Radiographs show bone on bone arthritis medial and patellofemoral. He presents now for left Total Knee Arthroplasty.     Procedure in detail---   The patient is brought into the operating room and positioned supine on the operating table. After successful administration of  General,   a tourniquet is placed high on the  Left thigh(s) and the lower extremity is prepped and draped in the usual sterile fashion. Time out is performed by the operating team and then the  Left lower extremity is wrapped in Esmarch, knee flexed and the tourniquet inflated to 300 mmHg.       A midline incision is made with a ten blade through the subcutaneous tissue to the level of the extensor mechanism. A fresh blade is used to make a medial parapatellar arthrotomy. Soft tissue over the proximal medial tibia is subperiosteally elevated to the joint line with a knife and into the semimembranosus bursa with a Cobb elevator. Soft tissue over the proximal lateral tibia is elevated with attention being paid to avoiding the patellar tendon on the tibial tubercle. The  patella is everted, knee flexed 90 degrees and the ACL and PCL are removed. Findings are exposed bone medial and patellofemoral and large patellar osteophytes,        The drill is used to create a starting hole in the distal femur and the canal is thoroughly irrigated with sterile saline to remove the fatty contents. The 5 degree Left  valgus alignment guide is placed into the femoral canal and the distal femoral cutting block is pinned to remove 10 mm off the distal femur. Resection is made with an oscillating saw.      The tibia is subluxed forward and the menisci are removed. The extramedullary alignment guide is placed referencing proximally at the medial aspect of the tibial tubercle and distally along the second metatarsal axis and tibial crest. The block is pinned to remove 2mm off the more deficient medial  side. Resection is made with an oscillating saw. Size 4is the most appropriate size for the tibia and the proximal tibia is prepared with the modular drill and keel punch for that size.      The femoral sizing guide is placed and size 5 is most appropriate. Rotation is marked off the epicondylar axis and confirmed by creating a rectangular flexion gap at 90 degrees. The size 5 cutting block is pinned in this rotation and the anterior, posterior and chamfer cuts are made with the oscillating saw. The intercondylar block is then placed and that cut is made.      Trial size 4 tibial component, trial size 5 posterior stabilized femur and a 12.5  mm posterior stabilized rotating  platform insert trial is placed. Full extension is achieved with excellent varus/valgus and anterior/posterior balance throughout full range of motion. The patella is everted and thickness measured to be 27  mm. Free hand resection is taken to 15 mm, a 41 template is placed, lug holes are drilled, trial patella is placed, and it tracks normally. Osteophytes are removed off the posterior femur with the trial in place. All trials are  removed and the cut bone surfaces prepared with pulsatile lavage. Cement is mixed and once ready for implantation, the size 4 tibial implant, size  5 posterior stabilized femoral component, and the size 41 patella are cemented in place and the patella is held with the clamp. The trial insert is placed and the knee held in full extension. The Exparel (20 ml mixed with 50 ml saline) is injected into the extensor mechanism, posterior capsule, medial and lateral gutters and subcutaneous tissues.  All extruded cement is removed and once the cement is hard the permanent 12.5 mm posterior stabilized rotating platform insert is placed into the tibial tray.      The wound is copiously irrigated with saline solution and the extensor mechanism closed over a hemovac drain with #1 PDS suture. The tourniquet is released for a total tourniquet time of 36  minutes. Flexion against gravity is 140 degrees and the patella tracks normally. Subcutaneous tissue is closed with 2.0 vicryl and subcuticular with running 4.0 Monocryl. The incision is cleaned and dried and steri-strips and a bulky sterile dressing are applied. The limb is placed into a knee immobilizer and the patient is awakened and transported to recovery in stable condition.      Please note that a surgical assistant was a medical necessity for this procedure in order to perform it in a safe and expeditious manner. Surgical assistant was necessary to retract the ligaments and vital neurovascular structures to prevent injury to them and also necessary for proper positioning of the limb to allow for anatomic placement of the prosthesis.   Samuel Chambers Samuel Javid, MD    09/04/2012, 8:24 AM

## 2012-09-04 NOTE — Anesthesia Procedure Notes (Signed)
Procedure Name: Intubation Date/Time: 09/04/2012 7:25 AM Performed by: Leroy Libman L Patient Re-evaluated:Patient Re-evaluated prior to inductionOxygen Delivery Method: Circle system utilized Preoxygenation: Pre-oxygenation with 100% oxygen Intubation Type: IV induction Ventilation: Mask ventilation without difficulty and Oral airway inserted - appropriate to patient size Laryngoscope Size: Miller and 3 Grade View: Grade II Tube type: Oral Tube size: 8.0 mm Number of attempts: 1 Airway Equipment and Method: Stylet Placement Confirmation: ETT inserted through vocal cords under direct vision,  breath sounds checked- equal and bilateral and positive ETCO2 Secured at: 22 cm Tube secured with: Tape Dental Injury: Injury to lip and Teeth and Oropharynx as per pre-operative assessment

## 2012-09-04 NOTE — Anesthesia Preprocedure Evaluation (Addendum)
Anesthesia Evaluation  Patient identified by MRN, date of birth, ID band Patient awake    Reviewed: Allergy & Precautions, H&P , NPO status , Patient's Chart, lab work & pertinent test results  History of Anesthesia Complications (+) PONV  Airway Mallampati: II TM Distance: >3 FB Neck ROM: Full    Dental No notable dental hx.    Pulmonary neg pulmonary ROS,  breath sounds clear to auscultation  Pulmonary exam normal       Cardiovascular Exercise Tolerance: Good hypertension, + CAD and + Cardiac Stents negative cardio ROS  Rhythm:Regular Rate:Normal     Neuro/Psych Anxiety negative neurological ROS     GI/Hepatic Neg liver ROS, GERD-  ,  Endo/Other  negative endocrine ROS  Renal/GU negative Renal ROS  negative genitourinary   Musculoskeletal negative musculoskeletal ROS (+)   Abdominal (+) + obese,   Peds negative pediatric ROS (+)  Hematology negative hematology ROS (+)   Anesthesia Other Findings   Reproductive/Obstetrics negative OB ROS                          Anesthesia Physical Anesthesia Plan  ASA: III  Anesthesia Plan: General   Post-op Pain Management:    Induction: Intravenous  Airway Management Planned: Oral ETT  Additional Equipment:   Intra-op Plan:   Post-operative Plan: Extubation in OR  Informed Consent: I have reviewed the patients History and Physical, chart, labs and discussed the procedure including the risks, benefits and alternatives for the proposed anesthesia with the patient or authorized representative who has indicated his/her understanding and acceptance.   Dental advisory given  Plan Discussed with: CRNA  Anesthesia Plan Comments: (Discussed r/b general versus spinal. Last Plavix 08/29/12. Plan general.)       Anesthesia Quick Evaluation

## 2012-09-04 NOTE — Anesthesia Postprocedure Evaluation (Signed)
  Anesthesia Post-op Note  Patient: Samuel Chambers  Procedure(s) Performed: Procedure(s) (LRB): TOTAL KNEE ARTHROPLASTY (Left)  Patient Location: PACU  Anesthesia Type: General  Level of Consciousness: awake and alert   Airway and Oxygen Therapy: Patient Spontanous Breathing  Post-op Pain: mild  Post-op Assessment: Post-op Vital signs reviewed, Patient's Cardiovascular Status Stable, Respiratory Function Stable, Patent Airway and No signs of Nausea or vomiting  Last Vitals:  Filed Vitals:   09/04/12 0930  BP: 167/91  Pulse: 79  Temp: 36.8 C  Resp: 16    Post-op Vital Signs: stable   Complications: No apparent anesthesia complications

## 2012-09-04 NOTE — Interval H&P Note (Signed)
History and Physical Interval Note:  09/04/2012 7:15 AM  Samuel Chambers  has presented today for surgery, with the diagnosis of OSTEOARHTRITIS LEFT KNEE  The various methods of treatment have been discussed with the patient and family. After consideration of risks, benefits and other options for treatment, the patient has consented to  Procedure(s) (LRB) with comments: TOTAL KNEE ARTHROPLASTY (Left) as a surgical intervention .  The patient's history has been reviewed, patient examined, no change in status, stable for surgery.  I have reviewed the patient's chart and labs.  Questions were answered to the patient's satisfaction.     Loanne Drilling

## 2012-09-04 NOTE — Evaluation (Signed)
Physical Therapy Evaluation Patient Details Name: Samuel Chambers MRN: [id number] DOB: March 15, 1951 Today's Date: 09/04/2012 Time: 1345-1420 PT Time Calculation (min): 35 min  PT Assessment / Plan / Recommendation Clinical Impression  Pt s/p L TKR presents with decreased L LE strength/ROM and post op pain limiting functional mobility    PT Assessment  Patient needs continued PT services    Follow Up Recommendations  Home health PT    Does the patient have the potential to tolerate intense rehabilitation      Barriers to Discharge None      Equipment Recommendations  Rolling walker with 5" wheels    Recommendations for Other Services OT consult   Frequency 7X/week    Precautions / Restrictions Precautions Precautions: Knee;Fall Required Braces or Orthoses: Knee Immobilizer - Left Knee Immobilizer - Left: Discontinue once straight leg raise with < 10 degree lag (Pt performed IND SLR this date) Restrictions Weight Bearing Restrictions: No Other Position/Activity Restrictions: WBAT   Pertinent Vitals/Pain 5/10; premed, ice packs provided      Mobility  Bed Mobility Bed Mobility: Supine to Sit Supine to Sit: 4: Min assist Details for Bed Mobility Assistance: cues for sequence and use of R LE to self assist Transfers Transfers: Sit to Stand;Stand to Sit Sit to Stand: 4: Min assist Stand to Sit: 4: Min assist Details for Transfer Assistance: cues for LE management and use of UEs to self assist Ambulation/Gait Ambulation/Gait Assistance: 4: Min assist Ambulation Distance (Feet): 29 Feet (twice) Assistive device: Rolling walker Ambulation/Gait Assistance Details: cues for sequence, posture, position from RW and ER on L Gait Pattern: Step-to pattern    Exercises Total Joint Exercises Ankle Circles/Pumps: AROM;Both;15 reps;Supine Quad Sets: AROM;10 reps;Both;Supine Heel Slides: AAROM;Left;10 reps;Supine Straight Leg Raises: AROM;AAROM;Left;10 reps;Supine   PT  Diagnosis: Difficulty walking  PT Problem List: Decreased strength;Decreased range of motion;Decreased activity tolerance;Decreased mobility;Decreased knowledge of use of DME;Pain PT Treatment Interventions: DME instruction;Gait training;Stair training;Functional mobility training;Therapeutic activities;Therapeutic exercise;Patient/family education   PT Goals Acute Rehab PT Goals PT Goal Formulation: With patient Time For Goal Achievement: 09/08/12 Potential to Achieve Goals: Good Pt will go Supine/Side to Sit: with supervision PT Goal: Supine/Side to Sit - Progress: Goal set today Pt will go Sit to Supine/Side: with supervision PT Goal: Sit to Supine/Side - Progress: Goal set today Pt will go Sit to Stand: with supervision PT Goal: Sit to Stand - Progress: Goal set today Pt will go Stand to Sit: with supervision PT Goal: Stand to Sit - Progress: Goal set today Pt will Ambulate: >150 feet;with supervision;with rolling walker PT Goal: Ambulate - Progress: Goal set today Pt will Go Up / Down Stairs: 3-5 stairs;with min assist;with least restrictive assistive device PT Goal: Up/Down Stairs - Progress: Goal set today  Visit Information  Last PT Received On: 09/04/12 Assistance Needed: +1    Subjective Data  Subjective: I want to get moving.  I'm hoping Dr Samuel Chambers will let me go home tomorrow Patient Stated Goal: Resume previous lifestyle with decreased pain   Prior Functioning  Home Living Lives With: Spouse Available Help at Discharge: Family Type of Home: House Home Access: Stairs to enter Entrance Stairs-Number of Steps: 3 Entrance Stairs-Rails: Right;Left Home Layout: One level Home Adaptive Equipment: None Prior Function Level of Independence: Independent Able to Take Stairs?: Yes Driving: Yes Vocation: Full time employment Communication Communication: No difficulties Dominant Hand: Right    Cognition  Cognition Overall Cognitive Status: Appears within functional  limits for tasks assessed/performed Arousal/Alertness:  Awake/alert Orientation Level: Appears intact for tasks assessed Behavior During Session: [facility] for tasks performed    Extremity/Trunk Assessment Right Upper Extremity Assessment RUE ROM/Strength/Tone: [facility] for tasks assessed Left Upper Extremity Assessment LUE ROM/Strength/Tone: WFL for tasks assessed Right Lower Extremity Assessment RLE ROM/Strength/Tone: [facility] for tasks assessed Left Lower Extremity Assessment LLE ROM/Strength/Tone: Deficits LLE ROM/Strength/Tone Deficits: 3/5 quads with AAROM at knee -10 - 60   Balance    End of Session PT - End of Session Equipment Utilized During Treatment: Gait belt Activity Tolerance: Patient tolerated treatment well Patient left: in chair;with call bell/phone within reach;with family/visitor present Nurse Communication: Mobility status CPM Left Knee CPM Left Knee: On Left Knee Flexion (Degrees): 40  Left Knee Extension (Degrees): 0  Additional Comments: IN CPM 5PM  GP     Samuel Chambers 09/04/2012, 5:45 PM

## 2012-09-04 NOTE — Progress Notes (Signed)
Utilization review completed.  

## 2012-09-04 NOTE — Transfer of Care (Signed)
Immediate Anesthesia Transfer of Care Note  Patient: Samuel Chambers  Procedure(s) Performed: Procedure(s) (LRB) with comments: TOTAL KNEE ARTHROPLASTY (Left)  Patient Location: PACU  Anesthesia Type:General  Level of Consciousness: awake, alert  and oriented  Airway & Oxygen Therapy: Patient Spontanous Breathing and Patient connected to face mask oxygen  Post-op Assessment: Report given to PACU RN and Post -op Vital signs reviewed and stable  Post vital signs: Reviewed and stable  Complications: No apparent anesthesia complications

## 2012-09-04 NOTE — Progress Notes (Signed)
Physical Therapy Treatment Patient Details Name: Samuel Chambers MRN: [id number] DOB: 01-Oct-1950 Today's Date: 09/04/2012 Time: 8657-8469 PT Time Calculation (min): 13 min  PT Assessment / Plan / Recommendation Comments on Treatment Session       Follow Up Recommendations  Home health PT     Does the patient have the potential to tolerate intense rehabilitation     Barriers to Discharge None      Equipment Recommendations  Rolling walker with 5" wheels    Recommendations for Other Services OT consult  Frequency 7X/week   Plan Discharge plan remains appropriate    Precautions / Restrictions Precautions Precautions: Knee;Fall Required Braces or Orthoses: Knee Immobilizer - Left Knee Immobilizer - Left: Discontinue once straight leg raise with < 10 degree lag Restrictions Weight Bearing Restrictions: No Other Position/Activity Restrictions: WBAT   Pertinent Vitals/Pain     Mobility  Bed Mobility Bed Mobility: Sit to Supine Supine to Sit: 4: Min assist Sit to Supine: 4: Min assist Details for Bed Mobility Assistance: cues for sequence and use of R LE to self assist Transfers Transfers: Sit to Stand;Stand to Sit Sit to Stand: 4: Min assist Stand to Sit: 4: Min assist Details for Transfer Assistance: cues for LE management and use of UEs to self assist Ambulation/Gait Ambulation/Gait Assistance: 4: Min assist Ambulation Distance (Feet): 6 Feet Assistive device: Rolling walker Ambulation/Gait Assistance Details: cues for position from RW, sequence and posture Gait Pattern: Step-to pattern    Exercises Total Joint Exercises Ankle Circles/Pumps: AROM;Both;15 reps;Supine Quad Sets: AROM;10 reps;Both;Supine Heel Slides: AAROM;Left;10 reps;Supine Straight Leg Raises: AROM;AAROM;Left;10 reps;Supine   PT Diagnosis: Difficulty walking  PT Problem List: Decreased strength;Decreased range of motion;Decreased activity tolerance;Decreased mobility;Decreased knowledge of use  of DME;Pain PT Treatment Interventions: DME instruction;Gait training;Stair training;Functional mobility training;Therapeutic activities;Therapeutic exercise;Patient/family education   PT Goals Acute Rehab PT Goals PT Goal Formulation: With patient Time For Goal Achievement: 09/08/12 Potential to Achieve Goals: Good Pt will go Supine/Side to Sit: with supervision PT Goal: Supine/Side to Sit - Progress: Goal set today Pt will go Sit to Supine/Side: with supervision PT Goal: Sit to Supine/Side - Progress: Goal set today Pt will go Sit to Stand: with supervision PT Goal: Sit to Stand - Progress: Goal set today Pt will go Stand to Sit: with supervision PT Goal: Stand to Sit - Progress: Goal set today Pt will Ambulate: >150 feet;with supervision;with rolling walker PT Goal: Ambulate - Progress: Goal set today Pt will Go Up / Down Stairs: 3-5 stairs;with min assist;with least restrictive assistive device PT Goal: Up/Down Stairs - Progress: Goal set today  Visit Information  Last PT Received On: 09/04/12 Assistance Needed: +1    Subjective Data  Subjective: I need to get back to bed Patient Stated Goal: Resume previous lifestyle with decreased pain   Cognition  Cognition Overall Cognitive Status: Appears within functional limits for tasks assessed/performed Arousal/Alertness: Awake/alert Orientation Level: Appears intact for tasks assessed Behavior During Session: [facility] for tasks performed    Balance     End of Session PT - End of Session Equipment Utilized During Treatment: Gait belt Activity Tolerance: Patient tolerated treatment well Patient left: in chair;with call bell/phone within reach;with family/visitor present Nurse Communication: Mobility status CPM Left Knee CPM Left Knee: On Left Knee Flexion (Degrees): 40  Left Knee Extension (Degrees): 0  Additional Comments: IN CPM 5PM   GP     Samuel Chambers 09/04/2012, 5:48 PM

## 2012-09-04 NOTE — Progress Notes (Signed)
Orthopedic Tech Progress Note Patient Details:  Samuel Chambers 1951-03-27 [id number]  CPM Left Knee CPM Left Knee: On Left Knee Flexion (Degrees): 40  Left Knee Extension (Degrees): 0  Additional Comments: IN CPM 5PM   Cammer, Mickie Bail 09/04/2012, 5:42 PM

## 2012-09-05 LAB — CBC
MCH: 29.8 pg (ref 26.0–34.0)
MCHC: 34.9 g/dL (ref 30.0–36.0)
MCV: 85.2 fL (ref 78.0–100.0)
Platelets: 210 10*3/uL (ref 150–400)
RBC: 3.93 MIL/uL — ABNORMAL LOW (ref 4.22–5.81)

## 2012-09-05 LAB — BASIC METABOLIC PANEL
CO2: 26 mEq/L (ref 19–32)
Calcium: 8.7 mg/dL (ref 8.4–10.5)
Creatinine, Ser: 0.85 mg/dL (ref 0.50–1.35)
GFR calc non Af Amer: >90 mL/min (ref >90)
Glucose, Bld: 165 mg/dL — ABNORMAL HIGH (ref 70–99)
Sodium: 135 mEq/L (ref 135–145)

## 2012-09-05 NOTE — Progress Notes (Signed)
Physical Therapy Treatment Patient Details Name: Onaje Warne MRN: [id number] DOB: 12-02-1950 Today's Date: 09/05/2012 Time: 4098-1191 PT Time Calculation (min): 38 min  PT Assessment / Plan / Recommendation Comments on Treatment Session       Follow Up Recommendations  Home health PT     Does the patient have the potential to tolerate intense rehabilitation     Barriers to Discharge        Equipment Recommendations  Rolling walker with 5" wheels    Recommendations for Other Services OT consult  Frequency 7X/week   Plan Discharge plan remains appropriate    Precautions / Restrictions Precautions Precautions: Knee;Fall Required Braces or Orthoses: Knee Immobilizer - Left Knee Immobilizer - Left: Discontinue once straight leg raise with < 10 degree lag Restrictions Weight Bearing Restrictions: No Other Position/Activity Restrictions: WBAT   Pertinent Vitals/Pain 6/10; premed, cold packs provided    Mobility  Bed Mobility Bed Mobility: Supine to Sit Supine to Sit: 4: Min guard Details for Bed Mobility Assistance: cues for sequence and use of R LE to self assist Transfers Transfers: Sit to Stand;Stand to Sit Sit to Stand: 4: Min guard Stand to Sit: 4: Min guard Details for Transfer Assistance: cues for LE management and use of UEs to self assist Ambulation/Gait Ambulation/Gait Assistance: 4: Min guard Ambulation Distance (Feet): 100 Feet (TWICE) Assistive device: Rolling walker Ambulation/Gait Assistance Details: min cues for position from RW Gait Pattern: Step-to pattern    Exercises Total Joint Exercises Ankle Circles/Pumps: AROM;Both;15 reps;Supine Quad Sets: AROM;Both;Supine;20 reps Heel Slides: AAROM;Left;Supine;20 reps Straight Leg Raises: AROM;AAROM;Left;Supine;20 reps   PT Diagnosis:    PT Problem List:   PT Treatment Interventions:     PT Goals Acute Rehab PT Goals PT Goal Formulation: With patient Time For Goal Achievement:  09/08/12 Potential to Achieve Goals: Good Pt will go Supine/Side to Sit: with supervision PT Goal: Supine/Side to Sit - Progress: Progressing toward goal Pt will go Sit to Supine/Side: with supervision PT Goal: Sit to Supine/Side - Progress: Progressing toward goal Pt will go Sit to Stand: with supervision PT Goal: Sit to Stand - Progress: Progressing toward goal Pt will go Stand to Sit: with supervision PT Goal: Stand to Sit - Progress: Progressing toward goal Pt will Ambulate: >150 feet;with supervision;with rolling walker PT Goal: Ambulate - Progress: Progressing toward goal  Visit Information  Last PT Received On: 09/05/12 Assistance Needed: +1    Subjective Data  Subjective: I'm ready Patient Stated Goal: Resume previous lifestyle with decreased pain   Cognition  Cognition Overall Cognitive Status: Appears within functional limits for tasks assessed/performed Arousal/Alertness: Awake/alert Orientation Level: Appears intact for tasks assessed Behavior During Session: [facility] for tasks performed    Balance     End of Session PT - End of Session Equipment Utilized During Treatment: Gait belt Activity Tolerance: Patient tolerated treatment well Patient left: in chair;with call bell/phone within reach;with family/visitor present Nurse Communication: Mobility status   GP     Francesco Provencal 09/05/2012, 8:45 AM

## 2012-09-05 NOTE — Progress Notes (Addendum)
   Subjective: 1 Day Post-Op Procedure(s) (LRB): TOTAL KNEE ARTHROPLASTY (Left) Patient reports pain as mild.   Patient seen in rounds with Dr. Lequita Halt.  Wife in room at bedside. Patient is well, but has had some minor complaints of pain in the knee, requiring pain medications We will start therapy today.  Plan is to go Home after hospital stay. Already doing SLR's this morning on rounds.  Objective: Vital signs in last 24 hours: Temp:  [97.6 F (36.4 C)-98.7 F (37.1 C)] 97.6 F (36.4 C) (02/08 0708) Pulse Rate:  [75-110] 101 (02/08 0708) Resp:  [11-20] 16 (02/08 0708) BP: (122-179)/(74-100) 167/83 mmHg (02/08 0708) SpO2:  [94 %-100 %] 97 % (02/08 0708) Weight:  [118.842 kg (262 lb)] 118.842 kg (262 lb) (02/07 0951)  Intake/Output from previous day:  Intake/Output Summary (Last 24 hours) at 09/05/12 0741 Last data filed at 09/05/12 0517  Gross per 24 hour  Intake   2180 ml  Output   5000 ml  Net  -2820 ml    Intake/Output this shift:    Labs:  Recent Labs  09/05/12 0433  HGB 11.7*    Recent Labs  09/05/12 0433  WBC 15.5*  RBC 3.93*  HCT 33.5*  PLT 210    Recent Labs  09/05/12 0433  NA 135  K 4.7  CL 99  CO2 26  BUN 12  CREATININE 0.85  GLUCOSE 165*  CALCIUM 8.7   No results found for this basename: LABPT, INR,  in the last 72 hours  EXAM General - Patient is Alert, Appropriate and Oriented Extremity - Neurovascular intact Sensation intact distally Dorsiflexion/Plantar flexion intact Dressing - dressing C/D/I Motor Function - intact, moving foot and toes well on exam.  Hemovac pulled without difficulty.  Past Medical History  Diagnosis Date  . Diverticulosis   . Internal hemorrhoids   . Adenomatous colon polyp 03/2005  . Hypertension   . Hyperlipidemia   . Anxiety   . GERD (gastroesophageal reflux disease)   . Degenerative joint disease   . CAD (coronary artery disease)   . PONV (postoperative nausea and vomiting)      Assessment/Plan: 1 Day Post-Op Procedure(s) (LRB): TOTAL KNEE ARTHROPLASTY (Left) Principal Problem:   OA (osteoarthritis) of knee  Estimated body mass index is 36.56 kg/(m^2) as calculated from the following:   Height as of this encounter: 5\' 11"  (1.803 m).   Weight as of this encounter: 118.842 kg (262 lb). Advance diet Up with therapy Discharge home with home health when improved  DVT Prophylaxis - Xarelto Weight-Bearing as tolerated to left leg No vaccines. D/C O2 and Pulse OX and try on Room [address]  Patrica Duel 09/05/2012, 7:41 AM

## 2012-09-05 NOTE — Progress Notes (Signed)
Physical Therapy Treatment Patient Details Name: Samuel Chambers MRN: [id number] DOB: [birth year] Today's Date: 09/05/2012 Time: 1308-6578 PT Time Calculation (min): 29 min  PT Assessment / Plan / Recommendation Comments on Treatment Session       Follow Up Recommendations  Home health PT     Does the patient have the potential to tolerate intense rehabilitation     Barriers to Discharge        Equipment Recommendations  Rolling walker with 5" wheels    Recommendations for Other Services OT consult  Frequency 7X/week   Plan Discharge plan remains appropriate    Precautions / Restrictions Precautions Precautions: Knee Precaution Comments: Pt able to independently do SLR. Restrictions Weight Bearing Restrictions: No Other Position/Activity Restrictions: WBAT   Pertinent Vitals/Pain     Mobility  Bed Mobility Bed Mobility: Supine to Sit;Sit to Supine Supine to Sit: 5: Supervision Sit to Supine: 5: Supervision Details for Bed Mobility Assistance: cues for sequence and use of R LE to self assist Transfers Transfers: Sit to Stand;Stand to Sit Sit to Stand: 5: Supervision Stand to Sit: 5: Supervision Details for Transfer Assistance: min cues for hand placement and LLE management. Ambulation/Gait Ambulation/Gait Assistance: 5: Supervision Ambulation Distance (Feet): 123 Feet Assistive device: Rolling walker Ambulation/Gait Assistance Details: min cues for position from RW Gait Pattern: Step-to pattern Stairs: Yes Stairs Assistance: 4: Min guard Stair Management Technique: One rail Right;Step to pattern;Forwards;With crutches Number of Stairs: 4 (3 times)    Exercises     PT Diagnosis:    PT Problem List:   PT Treatment Interventions:     PT Goals Acute Rehab PT Goals PT Goal Formulation: With patient Time For Goal Achievement: 09/08/12 Potential to Achieve Goals: Good Pt will go Supine/Side to Sit: with supervision PT Goal: Supine/Side to Sit - Progress:  Progressing toward goal Pt will go Sit to Supine/Side: with supervision PT Goal: Sit to Supine/Side - Progress: Progressing toward goal Pt will go Sit to Stand: with supervision PT Goal: Sit to Stand - Progress: Progressing toward goal Pt will go Stand to Sit: with supervision PT Goal: Stand to Sit - Progress: Progressing toward goal Pt will Ambulate: >150 feet;with supervision;with rolling walker PT Goal: Ambulate - Progress: Progressing toward goal Pt will Go Up / Down Stairs: 3-5 stairs;with min assist;with least restrictive assistive device PT Goal: Up/Down Stairs - Progress: Met  Visit Information  Last PT Received On: 09/08/12 Assistance Needed: +1    Subjective Data  Subjective: I'll do whatever you need me to Patient Stated Goal: Resume previous lifestyle with decreased pain   Cognition  Cognition Overall Cognitive Status: Appears within functional limits for tasks assessed/performed Arousal/Alertness: Awake/alert Orientation Level: Appears intact for tasks assessed Behavior During Session: [facility] for tasks performed    Balance     End of Session PT - End of Session Equipment Utilized During Treatment: Gait belt Activity Tolerance: Patient tolerated treatment well Patient left: in bed;with call bell/phone within reach;with family/visitor present Nurse Communication: Mobility status   GP     Samuel Chambers 09/05/2012, 2:54 PM

## 2012-09-05 NOTE — Evaluation (Signed)
Occupational Therapy Evaluation Patient Details Name: Samuel Chambers MRN: [id number] DOB: 1951/01/29 Today's Date: 09/05/2012 Time: 1478-2956 OT Time Calculation (min): 16 min  OT Assessment / Plan / Recommendation Clinical Impression  Pt presents POD 1 LTKR. All education completed. Pt will have necessary level of A from caregiver upon d/c.    OT Assessment  Patient does not need any further OT services    Follow Up Recommendations  No OT follow up    Barriers to Discharge      Equipment Recommendations  3 in 1 bedside comode    Recommendations for Other Services    Frequency       Precautions / Restrictions Precautions Precautions: Knee Precaution Comments: Pt able to independently do SLR. Required Braces or Orthoses: Knee Immobilizer - Left Knee Immobilizer - Left: Discontinue once straight leg raise with < 10 degree lag Restrictions Weight Bearing Restrictions: No Other Position/Activity Restrictions: WBAT   Pertinent Vitals/Pain Pt reported 7/10 pain with mobility. Repositioned for comfort.    ADL  Grooming: Supervision/safety Where Assessed - Grooming: Supported standing Upper Body Bathing: Supervision/safety Where Assessed - Upper Body Bathing: Unsupported standing Lower Body Bathing: Minimal assistance Where Assessed - Lower Body Bathing: Supported sit to stand Upper Body Dressing: Supervision/safety Where Assessed - Upper Body Dressing: Supported standing Lower Body Dressing: Minimal assistance Where Assessed - Lower Body Dressing: Supported sit to Pharmacist, hospital: Supervision/safety Statistician Method: Sit to Barista: Raised toilet seat with arms (or 3-in-1 over toilet) Toileting - Clothing Manipulation and Hygiene: Supervision/safety Where Assessed - Toileting Clothing Manipulation and Hygiene: Sit to stand from 3-in-1 or toilet Equipment Used: Rolling walker Transfers/Ambulation Related to ADLs: Pt ambulated to the  bathroom with supervision and RW. ADL Comments: Discussed with pt how to safely step into tub. Pt stated he would be spongebathing until able to do so. Wife will assist with bathing/dressing.    OT Diagnosis:    OT Problem List:   OT Treatment Interventions:     OT Goals    Visit Information  Last OT Received On: 09/05/12 Assistance Needed: +1    Subjective Data  Subjective: I had my hip replaced a few years ago. Patient Stated Goal: Not asked.   Prior Functioning     Home Living Lives With: Spouse Type of Home: House Home Access: Stairs to enter Secretary/administrator of Steps: 4 Entrance Stairs-Rails: Right Home Layout: One level Bathroom Shower/Tub: Engineer, manufacturing systems: Standard Home Adaptive Equipment: None Prior Function Level of Independence: Independent Able to Take Stairs?: Yes Driving: Yes Vocation: Full time employment Communication Communication: No difficulties Dominant Hand: Right         Vision/Perception     Cognition  Cognition Overall Cognitive Status: Appears within functional limits for tasks assessed/performed Arousal/Alertness: Awake/alert Orientation Level: Appears intact for tasks assessed Behavior During Session: [name] for tasks performed    Extremity/Trunk Assessment Right Upper Extremity Assessment RUE ROM/Strength/Tone: [facility] for tasks assessed Left Upper Extremity Assessment LUE ROM/Strength/Tone: WFL for tasks assessed     Mobility Bed Mobility Bed Mobility: Supine to Sit Supine to Sit: 4: Min guard Sit to Supine: 6: Modified independent (Device/Increase time) Details for Bed Mobility Assistance: cues for sequence and use of R LE to self assist Transfers Sit to Stand: 5: Supervision Stand to Sit: 5: Supervision Details for Transfer Assistance: min cues for hand placement and LLE management.     Exercise Total Joint Exercises Ankle Circles/Pumps: AROM;Both;15 reps;Supine Quad Sets: AROM;Both;Supine;20  reps Heel Slides: AAROM;Left;Supine;20 reps Straight Leg Raises: AROM;AAROM;Left;Supine;20 reps   Balance     End of Session OT - End of Session Activity Tolerance: Patient tolerated treatment well Patient left: in bed  GO     Samuel Chambers A OTR/L [phone] 09/05/2012, 11:03 AM

## 2012-09-06 LAB — CBC
MCH: 29.8 pg (ref 26.0–34.0)
MCHC: 34.4 g/dL (ref 30.0–36.0)
MCV: 86.5 fL (ref 78.0–100.0)
Platelets: 220 10*3/uL (ref 150–400)
RBC: 3.63 MIL/uL — ABNORMAL LOW (ref 4.22–5.81)

## 2012-09-06 LAB — BASIC METABOLIC PANEL
CO2: 27 mEq/L (ref 19–32)
Calcium: 8.3 mg/dL — ABNORMAL LOW (ref 8.4–10.5)
Creatinine, Ser: 0.92 mg/dL (ref 0.50–1.35)

## 2012-09-06 MED ORDER — OXYCODONE HCL 5 MG PO TABS: 5.0000 mg | ORAL_TABLET | ORAL | Status: DC | PRN

## 2012-09-06 MED ORDER — METHOCARBAMOL 500 MG PO TABS: 500.0000 mg | ORAL_TABLET | Freq: Four times a day (QID) | ORAL | Status: DC | PRN

## 2012-09-06 MED ORDER — RIVAROXABAN 10 MG PO TABS: 10.0000 mg | ORAL_TABLET | Freq: Every day | ORAL | Status: DC

## 2012-09-06 NOTE — Care Management Note (Addendum)
   CARE MANAGEMENT NOTE 09/06/2012  Patient:  ARCHIMEDES, HAROLD   Account Number:  [id number]  Date Initiated:  09/06/2012  Documentation initiated by:  Raiford Noble  Subjective/Objective Assessment:   admit for tka     Action/Plan:   dcp home with home health pt/ot services   Anticipated DC Date:  09/06/2012   Anticipated DC Plan:  HOME W HOME HEALTH SERVICES      DC Planning Services  CM consult      [facility] Choice  HOME HEALTH   Choice offered to / List presented to:     DME arranged  WALKER - Lavone Nian      DME agency  [facility]     HH arranged  HH-2 PT  HH-3 OT      [facility] agency  [facility]   Status of service:  Completed, signed off Medicare Important Message given?   (If response is "NO", the following Medicare IM given date fields will be blank) Date Medicare IM given:   Date Additional Medicare IM given:    Discharge Disposition:  HOME W HOME HEALTH SERVICES  Per UR Regulation:    If discussed at Long Length of Stay Meetings, dates discussed:    Comments:  09-06-12 Received referral for PT/OT services and DME. Patient reports he only needs rolling walker. Has had [place] in the past, however, [facility] does not accetp [employer]. Patient's second choice was [facility]. Faxed referral and obtained verbal confirmation that patient will be accepted to be seen for PT/OT home health services. No further needs assessed. Raiford Noble, RNCM [phone]

## 2012-09-06 NOTE — Discharge Summary (Signed)
Physician Discharge Summary   Patient ID: Samuel Chambers MRN: [id number] DOB/AGE: May 11, 1951 62 y.o.  Admit date: 09/04/2012 Discharge date: 09/06/2012  Primary Diagnosis:  Osteoarthritis Left knee  Admission Diagnoses:  Past Medical History  Diagnosis Date  . Diverticulosis   . Internal hemorrhoids   . Adenomatous colon polyp 03/2005  . Hypertension   . Hyperlipidemia   . Anxiety   . GERD (gastroesophageal reflux disease)   . Degenerative joint disease   . CAD (coronary artery disease)   . PONV (postoperative nausea and vomiting)    Discharge Diagnoses:   Principal Problem:   OA (osteoarthritis) of knee  Estimated body mass index is 36.56 kg/(m^2) as calculated from the following:   Height as of this encounter: 5\' 11"  (1.803 m).   Weight as of this encounter: 118.842 kg (262 lb).  Classification of overweight in adults according to BMI (WHO, 1998)   Procedure:  Procedure(s) (LRB): TOTAL KNEE ARTHROPLASTY (Left)   Consults: None  HPI: Samuel Chambers is a 62 y.o. year old male with end stage OA of his left knee with progressively worsening pain and dysfunction. He has constant pain, with activity and at rest and significant functional deficits with difficulties even with ADLs. He has had extensive non-op management including analgesics, injections of cortisone and viscosupplements, and home exercise program, but remains in significant pain with significant dysfunction. Radiographs show bone on bone arthritis medial and patellofemoral. He presents now for left Total Knee Arthroplasty.   Laboratory Data: Admission on 09/04/2012  Component Date Value Range Status  . ABO/RH(D) 09/04/2012 B POS   Final  . Antibody Screen 09/04/2012 NEG   Final  . Sample Expiration 09/04/2012 09/07/2012   Final  . WBC 09/05/2012 15.5* 4.0 - 10.5 K/uL Final  . RBC 09/05/2012 3.93* 4.22 - 5.81 MIL/uL Final  . Hemoglobin 09/05/2012 11.7* 13.0 - 17.0 g/dL Final  . HCT 40/98/1191 33.5* 39.0  - 52.0 % Final  . MCV 09/05/2012 85.2  78.0 - 100.0 fL Final  . MCH 09/05/2012 29.8  26.0 - 34.0 pg Final  . MCHC 09/05/2012 34.9  30.0 - 36.0 g/dL Final  . RDW 47/82/9562 13.5  11.5 - 15.5 % Final  . Platelets 09/05/2012 210  150 - 400 K/uL Final  . Sodium 09/05/2012 135  135 - 145 mEq/L Final  . Potassium 09/05/2012 4.7  3.5 - 5.1 mEq/L Final  . Chloride 09/05/2012 99  96 - 112 mEq/L Final  . CO2 09/05/2012 26  19 - 32 mEq/L Final  . Glucose, Bld 09/05/2012 165* 70 - 99 mg/dL Final  . BUN 13/02/6577 12  6 - 23 mg/dL Final  . Creatinine, Ser 09/05/2012 0.85  0.50 - 1.35 mg/dL Final  . Calcium 46/96/2952 8.7  8.4 - 10.5 mg/dL Final  . GFR calc non Af Amer 09/05/2012 >90  >90 mL/min Final  . GFR calc Af Amer 09/05/2012 >90  >90 mL/min Final   Comment:                                 The eGFR has been calculated                          using the CKD EPI equation.  This calculation has not been                          validated in all clinical                          situations.                          eGFR's persistently                          <90 mL/min signify                          possible Chronic Kidney Disease.  . WBC 09/06/2012 14.2* 4.0 - 10.5 K/uL Final  . RBC 09/06/2012 3.63* 4.22 - 5.81 MIL/uL Final  . Hemoglobin 09/06/2012 10.8* 13.0 - 17.0 g/dL Final  . HCT 16/04/9603 31.4* 39.0 - 52.0 % Final  . MCV 09/06/2012 86.5  78.0 - 100.0 fL Final  . MCH 09/06/2012 29.8  26.0 - 34.0 pg Final  . MCHC 09/06/2012 34.4  30.0 - 36.0 g/dL Final  . RDW 54/03/8118 13.9  11.5 - 15.5 % Final  . Platelets 09/06/2012 220  150 - 400 K/uL Final  . Sodium 09/06/2012 135  135 - 145 mEq/L Final  . Potassium 09/06/2012 4.0  3.5 - 5.1 mEq/L Final  . Chloride 09/06/2012 99  96 - 112 mEq/L Final  . CO2 09/06/2012 27  19 - 32 mEq/L Final  . Glucose, Bld 09/06/2012 142* 70 - 99 mg/dL Final  . BUN 14/78/2956 16  6 - 23 mg/dL Final  . Creatinine, Ser 09/06/2012 0.92   0.50 - 1.35 mg/dL Final  . Calcium 21/30/8657 8.3* 8.4 - 10.5 mg/dL Final  . GFR calc non Af Amer 09/06/2012 89* >90 mL/min Final  . GFR calc Af Amer 09/06/2012 >90  >90 mL/min Final   Comment:                                 The eGFR has been calculated                          using the CKD EPI equation.                          This calculation has not been                          validated in all clinical                          situations.                          eGFR's persistently                          <90 mL/min signify                          possible Chronic Kidney Disease.  Hospital Outpatient Visit on 08/28/2012  Component Date  Value Range Status  . aPTT 08/28/2012 30  24 - 37 seconds Final  . WBC 08/28/2012 6.9  4.0 - 10.5 K/uL Final  . RBC 08/28/2012 4.95  4.22 - 5.81 MIL/uL Final  . Hemoglobin 08/28/2012 14.7  13.0 - 17.0 g/dL Final  . HCT 16/04/9603 41.7  39.0 - 52.0 % Final  . MCV 08/28/2012 84.2  78.0 - 100.0 fL Final  . MCH 08/28/2012 29.7  26.0 - 34.0 pg Final  . MCHC 08/28/2012 35.3  30.0 - 36.0 g/dL Final  . RDW 54/03/8118 13.3  11.5 - 15.5 % Final  . Platelets 08/28/2012 201  150 - 400 K/uL Final  . Sodium 08/28/2012 136  135 - 145 mEq/L Final  . Potassium 08/28/2012 3.6  3.5 - 5.1 mEq/L Final  . Chloride 08/28/2012 100  96 - 112 mEq/L Final  . CO2 08/28/2012 26  19 - 32 mEq/L Final  . Glucose, Bld 08/28/2012 112* 70 - 99 mg/dL Final  . BUN 14/78/2956 17  6 - 23 mg/dL Final  . Creatinine, Ser 08/28/2012 0.87  0.50 - 1.35 mg/dL Final  . Calcium 21/30/8657 9.2  8.4 - 10.5 mg/dL Final  . Total Protein 08/28/2012 6.9  6.0 - 8.3 g/dL Final  . Albumin 84/69/6295 3.9  3.5 - 5.2 g/dL Final  . AST 28/41/3244 29  0 - 37 U/L Final  . ALT 08/28/2012 40  0 - 53 U/L Final  . Alkaline Phosphatase 08/28/2012 64  39 - 117 U/L Final  . Total Bilirubin 08/28/2012 0.4  0.3 - 1.2 mg/dL Final  . GFR calc non Af Amer 08/28/2012 >90  >90 mL/min Final  . GFR calc Af Amer  08/28/2012 >90  >90 mL/min Final   Comment:                                 The eGFR has been calculated                          using the CKD EPI equation.                          This calculation has not been                          validated in all clinical                          situations.                          eGFR's persistently                          <90 mL/min signify                          possible Chronic Kidney Disease.  Marland Kitchen Prothrombin Time 08/28/2012 12.5  11.6 - 15.2 seconds Final  . INR 08/28/2012 0.94  0.00 - 1.49 Final  . Color, Urine 08/28/2012 YELLOW  YELLOW Final  . APPearance 08/28/2012 CLEAR  CLEAR Final  . Specific Gravity, Urine 08/28/2012 1.027  1.005 - 1.030 Final  . pH 08/28/2012 6.5  5.0 - 8.0 Final  . Glucose,  UA 08/28/2012 NEGATIVE  NEGATIVE mg/dL Final  . Hgb urine dipstick 08/28/2012 NEGATIVE  NEGATIVE Final  . Bilirubin Urine 08/28/2012 NEGATIVE  NEGATIVE Final  . Ketones, ur 08/28/2012 NEGATIVE  NEGATIVE mg/dL Final  . Protein, ur 16/04/9603 NEGATIVE  NEGATIVE mg/dL Final  . Urobilinogen, UA 08/28/2012 1.0  0.0 - 1.0 mg/dL Final  . Nitrite 54/03/8118 NEGATIVE  NEGATIVE Final  . Leukocytes, UA 08/28/2012 NEGATIVE  NEGATIVE Final   MICROSCOPIC NOT DONE ON URINES WITH NEGATIVE PROTEIN, BLOOD, LEUKOCYTES, NITRITE, OR GLUCOSE <1000 mg/dL.  Marland Kitchen MRSA, PCR 08/28/2012 NEGATIVE  NEGATIVE Final  . Staphylococcus aureus 08/28/2012 NEGATIVE  NEGATIVE Final   Comment:                                 The Xpert SA Assay (FDA                          approved for NASAL specimens                          in patients over 12 years of age),                          is one component of                          a comprehensive surveillance                          program.  Test performance has                          been validated by [employer] for patients greater                          than or equal to 15 year old.                           It is not intended                          to diagnose infection nor to                          guide or monitor treatment.     X-Rays:Dg Chest 2 View  08/28/2012  *RADIOLOGY REPORT*  Clinical Data: Hypertension, preop evaluation for knee replacement  CHEST - 2 VIEW  Comparison: 07/28/2012  Findings: Low lung volumes persist with slight vascular and interstitial prominence as before.  No significant interval change. Negative for CHF or pneumonia.  Stable heart size.  No effusion or pneumothorax.  Trachea is midline.  IMPRESSION: Stable low volume exam.  No superimposed acute process   Original Report Authenticated By: Judie Petit. Shick, M.D.     EKG: Orders placed in visit on 02/24/07  . CONVERTED CEMR EKG     Hospital Course: Samuel Chambers is a 62 y.o. who was admitted to [facility].  They were brought to the operating room on 09/04/2012 and underwent Procedure(s): TOTAL KNEE ARTHROPLASTY.  Patient tolerated the procedure well and was later transferred to the recovery room and then to the orthopaedic floor for postoperative care.  They were given PO and IV analgesics for pain control following their surgery.  They were given 24 hours of postoperative antibiotics of  Anti-infectives   Start     Dose/Rate Route Frequency Ordered Stop   09/04/12 1400  ceFAZolin (ANCEF) IVPB 2 g/50 mL premix     2 g 100 mL/hr over 30 Minutes Intravenous Every 6 hours 09/04/12 1003 09/04/12 2146   09/04/12 0505  ceFAZolin (ANCEF) 3 g in dextrose 5 % 50 mL IVPB     3 g 160 mL/hr over 30 Minutes Intravenous On call to O.R. 09/04/12 0505 09/04/12 4098     and started on DVT prophylaxis in the form of Xarelto.   PT and OT were ordered for total joint protocol.  Discharge planning consulted to help with postop disposition and equipment needs.  Patient had a good night on the evening of surgery and started to get up OOB with therapy on day one. Hemovac drain was pulled without difficulty.  Continued  to work with therapy into day two.  Dressing was changed on day two and the incision was healing well.   Patient was seen in rounds and was ready to go home later that afternoon.   Discharge Medications: Prior to Admission medications   Medication Sig Start Date End Date Taking? Authorizing Provider  acetaminophen (TYLENOL) 500 MG tablet Take 1,000 mg by mouth every 6 (six) hours as needed. Pain   Yes Historical Provider, MD  DULoxetine (CYMBALTA) 60 MG capsule Take 60 mg by mouth every morning.    Yes Historical Provider, MD  losartan (COZAAR) 100 MG tablet Take 100 mg by mouth every morning.   Yes Historical Provider, MD  metoprolol succinate (TOPROL-XL) 50 MG 24 hr tablet Take 50 mg by mouth every morning. Take with or immediately following a meal.   Yes Historical Provider, MD  ranitidine (ZANTAC) 150 MG capsule Take 150 mg by mouth daily.    Yes Historical Provider, MD  methocarbamol (ROBAXIN) 500 MG tablet Take 1 tablet (500 mg total) by mouth every 6 (six) hours as needed. 09/06/12   Remy Voiles Julien Girt, PA  oxyCODONE (OXY IR/ROXICODONE) 5 MG immediate release tablet Take 1-4 tablets (5-20 mg total) by mouth every 3 (three) hours as needed. 09/06/12   [place] Souter Julien Girt, PA  rivaroxaban (XARELTO) 10 MG TABS tablet Take 1 tablet (10 mg total) by mouth daily with breakfast. Take Xarelto for two and a half more weeks, then discontinue Xarelto. Once the patient has completed the Xarelto, they may resume the 81 mg Aspirin and the 75 mg Plavix. 09/06/12   Seba Madole Julien Girt, PA    Diet: Cardiac diet Activity:WBAT Follow-up:in 2 weeks Disposition - Home Discharged Condition: good   Discharge Orders   Future Orders Complete By Expires     Call MD / Call 911  As directed     Comments:      If you experience chest pain or shortness of breath, CALL 911 and be transported to the hospital emergency room.  If you develope a fever above 101 F, pus (white drainage) or increased drainage or redness at  the wound, or calf pain, call your surgeon's office.    Change dressing  As directed     Comments:  Change dressing daily with sterile 4 x 4 inch gauze dressing and apply TED hose. Do not submerge the incision under water.    Constipation Prevention  As directed     Comments:      Drink plenty of fluids.  Prune juice may be helpful.  You may use a stool softener, such as Colace (over the counter) 100 mg twice a day.  Use MiraLax (over the counter) for constipation as needed.    Diet - low sodium heart healthy  As directed     Discharge instructions  As directed     Comments:      Pick up stool softner and laxative for home. Do not submerge incision under water. May shower. Continue to use ice for pain and swelling from surgery.  Take Xarelto for two and a half more weeks, then discontinue Xarelto. Once the patient has completed the Xarelto, they may resume the 81 mg Aspirin and the 75 mg Plavix.    Do not put a pillow under the knee. Place it under the heel.  As directed     Do not sit on low chairs, stoools or toilet seats, as it may be difficult to get up from low surfaces  As directed     Driving restrictions  As directed     Comments:      No driving until released by the physician.    Increase activity slowly as tolerated  As directed     Lifting restrictions  As directed     Comments:      No lifting until released by the physician.    Patient may shower  As directed     Comments:      You may shower without a dressing once there is no drainage.  Do not wash over the wound.  If drainage remains, do not shower until drainage stops.    TED hose  As directed     Comments:      Use stockings (TED hose) for 3 weeks on both leg(s).  You may remove them at night for sleeping.    Weight bearing as tolerated  As directed         Medication List    STOP taking these medications       aspirin EC 81 MG tablet     CIALIS 20 MG tablet  Generic drug:  tadalafil     clopidogrel  75 MG tablet  Commonly known as:  PLAVIX     fish oil-omega-3 fatty acids 1000 MG capsule     oxycodone 5 MG capsule  Commonly known as:  OXY-IR      TAKE these medications       acetaminophen 500 MG tablet  Commonly known as:  TYLENOL  Take 1,000 mg by mouth every 6 (six) hours as needed. Pain     DULoxetine 60 MG capsule  Commonly known as:  CYMBALTA  Take 60 mg by mouth every morning.     losartan 100 MG tablet  Commonly known as:  COZAAR  Take 100 mg by mouth every morning.     methocarbamol 500 MG tablet  Commonly known as:  ROBAXIN  Take 1 tablet (500 mg total) by mouth every 6 (six) hours as needed.     metoprolol succinate 50 MG 24 hr tablet  Commonly known as:  TOPROL-XL  Take 50 mg by mouth every morning. Take with or immediately following a meal.     oxyCODONE 5  MG immediate release tablet  Commonly known as:  Oxy IR/ROXICODONE  Take 1-4 tablets (5-20 mg total) by mouth every 3 (three) hours as needed.     ranitidine 150 MG capsule  Commonly known as:  ZANTAC  Take 150 mg by mouth daily.     rivaroxaban 10 MG Tabs tablet  Commonly known as:  XARELTO  Take 1 tablet (10 mg total) by mouth daily with breakfast. Take Xarelto for two and a half more weeks, then discontinue Xarelto.  Once the patient has completed the Xarelto, they may resume the 81 mg Aspirin and the 75 mg Plavix.           Follow-up Information   Follow up with Loanne Drilling, MD. Schedule an appointment as soon as possible for a visit on 09/17/2012.   Contact information:   [address], [address] [place] [address] [phone]       Signed: Patrica Duel 09/06/2012, 10:14 AM

## 2012-09-06 NOTE — Progress Notes (Signed)
Discharged from floor via w/c, spouse with pt. No changes in assessment. Samuel Chambers   

## 2012-09-06 NOTE — Progress Notes (Signed)
   Subjective: 2 Days Post-Op Procedure(s) (LRB): TOTAL KNEE ARTHROPLASTY (Left) Patient reports pain as mild.   Patient seen in rounds by Dr. Lequita Halt. Patient is well, and has had no acute complaints or problems Patient is ready to go home  Objective: Vital signs in last 24 hours: Temp:  [98.6 F (37 C)-98.8 F (37.1 C)] 98.6 F (37 C) (02/09 0545) Pulse Rate:  [86-106] 99 (02/09 0545) Resp:  [12-20] 12 (02/09 0800) BP: (124-157)/(69-85) 132/80 mmHg (02/09 0545) SpO2:  [92 %-98 %] 92 % (02/09 0800)  Intake/Output from previous day:  Intake/Output Summary (Last 24 hours) at 09/06/12 1008 Last data filed at 09/06/12 0907  Gross per 24 hour  Intake    960 ml  Output    425 ml  Net    535 ml    Intake/Output this shift: Total I/O In: 240 [P.O.:240] Out: 50 [Urine:50]  Labs:  Recent Labs  09/05/12 0433 09/06/12 0413  HGB 11.7* 10.8*    Recent Labs  09/05/12 0433 09/06/12 0413  WBC 15.5* 14.2*  RBC 3.93* 3.63*  HCT 33.5* 31.4*  PLT 210 220    Recent Labs  09/05/12 0433 09/06/12 0413  NA 135 135  K 4.7 4.0  CL 99 99  CO2 26 27  BUN 12 16  CREATININE 0.85 0.92  GLUCOSE 165* 142*  CALCIUM 8.7 8.3*   No results found for this basename: LABPT, INR,  in the last 72 hours  EXAM: General - Patient is Alert, Appropriate and Oriented Extremity - Neurovascular intact Sensation intact distally Dorsiflexion/Plantar flexion intact Incision - clean, dry, no drainage, healing Motor Function - intact, moving foot and toes well on exam.   Assessment/Plan: 2 Days Post-Op Procedure(s) (LRB): TOTAL KNEE ARTHROPLASTY (Left) Procedure(s) (LRB): TOTAL KNEE ARTHROPLASTY (Left) Past Medical History  Diagnosis Date  . Diverticulosis   . Internal hemorrhoids   . Adenomatous colon polyp 03/2005  . Hypertension   . Hyperlipidemia   . Anxiety   . GERD (gastroesophageal reflux disease)   . Degenerative joint disease   . CAD (coronary artery disease)   . PONV  (postoperative nausea and vomiting)    Principal Problem:   OA (osteoarthritis) of knee  Estimated body mass index is 36.56 kg/(m^2) as calculated from the following:   Height as of this encounter: 5\' 11"  (1.803 m).   Weight as of this encounter: 118.842 kg (262 lb). Up with therapy Discharge home with home health Diet - Cardiac diet Follow up - in 2 weeks on 09/17/2012 Activity - WBAT Disposition - Home Condition Upon Discharge - Good D/C Meds - See DC Summary DVT Prophylaxis - Xarelto  PERKINS, ALEXZANDREW 09/06/2012, 10:08 AM

## 2012-09-06 NOTE — Progress Notes (Signed)
Physical Therapy Treatment Patient Details Name: Samuel Chambers MRN: [id number] DOB: 25-Oct-1950 Today's Date: 09/06/2012 Time: 2952-8413 PT Time Calculation (min): 23 min  PT Assessment / Plan / Recommendation Comments on Treatment Session  Doing well with mobility, ready to DC home with HHPT.     Follow Up Recommendations  Home health PT     Does the patient have the potential to tolerate intense rehabilitation     Barriers to Discharge        Equipment Recommendations       Recommendations for Other Services    Frequency 7X/week   Plan Discharge plan remains appropriate;Frequency remains appropriate    Precautions / Restrictions Precautions Precautions: Knee Precaution Comments: Pt able to independently do SLR. Restrictions Weight Bearing Restrictions: No Other Position/Activity Restrictions: WBAT   Pertinent Vitals/Pain **4-5/10 L knee Ice applied*    Mobility  Bed Mobility Bed Mobility: Not assessed Details for Bed Mobility Assistance: cues for sequence and use of R LE to self assist Transfers Transfers: Sit to Stand;Stand to Sit Sit to Stand: 5: Supervision;From chair/3-in-1;With upper extremity assist Stand to Sit: 5: Supervision;To chair/3-in-1;With upper extremity assist Details for Transfer Assistance: min cues for hand placement and LLE management. Ambulation/Gait Ambulation/Gait Assistance: 5: Supervision Ambulation Distance (Feet): 160 Feet Assistive device: Rolling walker Gait Pattern: Step-to pattern General Gait Details: VCs to flex L knee during swing phase and for heel strike Stairs: No    Exercises Total Joint Exercises Ankle Circles/Pumps: AROM;Both;15 reps;Supine Quad Sets: AROM;Both;Supine;10 reps Towel Squeeze: AROM;Both;10 reps Short Arc Quad: AROM;Left;10 reps;Supine Heel Slides: AAROM;Left;Supine;10 reps Hip ABduction/ADduction: AROM;Left;10 reps;Supine Straight Leg Raises: AROM;AAROM;Left;Supine;10 reps Long Arc Quad:  AAROM;Left;10 reps;Seated Knee Flexion: Seated;Left;10 reps   PT Diagnosis:    PT Problem List:   PT Treatment Interventions:     PT Goals Acute Rehab PT Goals PT Goal Formulation: With patient Time For Goal Achievement: 09/08/12 Potential to Achieve Goals: Good Pt will go Supine/Side to Sit: with supervision Pt will go Sit to Supine/Side: with supervision Pt will go Sit to Stand: with supervision PT Goal: Sit to Stand - Progress: Met Pt will go Stand to Sit: with supervision PT Goal: Stand to Sit - Progress: Met Pt will Ambulate: >150 feet;with supervision;with rolling walker PT Goal: Ambulate - Progress: Met Pt will Go Up / Down Stairs: 3-5 stairs;with min assist;with least restrictive assistive device  Visit Information  Last PT Received On: 09/06/12 Assistance Needed: +1    Subjective Data  Subjective: I can't lift my leg today, yesterday I could. My knee doesn't bed as much as it did yesterday.  Patient Stated Goal: to walk   Cognition  Cognition Overall Cognitive Status: Appears within functional limits for tasks assessed/performed Arousal/Alertness: Awake/alert Orientation Level: Appears intact for tasks assessed Behavior During Session: [facility] for tasks performed    Balance     End of Session PT - End of Session Activity Tolerance: Patient tolerated treatment well Patient left: in chair Nurse Communication: Mobility status CPM Left Knee CPM Left Knee: Off   GP     Ralene Bathe Kistler 09/06/2012, 12:43 PM [phone]

## 2012-09-07 ENCOUNTER — Encounter (HOSPITAL_COMMUNITY): Payer: Self-pay | Admitting: Orthopedic Surgery

## 2012-09-17 ENCOUNTER — Ambulatory Visit (HOSPITAL_COMMUNITY)
Admission: RE | Admit: 2012-09-17 | Discharge: 2012-09-17 | Disposition: A | Discharge status: Home / Self Care | Payer: Managed Care, Other (non HMO) | Source: Ambulatory Visit | Attending: Orthopedic Surgery | Admitting: Orthopedic Surgery

## 2012-09-17 ENCOUNTER — Other Ambulatory Visit (HOSPITAL_COMMUNITY): Payer: Self-pay | Admitting: Vascular Surgery

## 2012-09-17 ENCOUNTER — Other Ambulatory Visit (HOSPITAL_COMMUNITY): Payer: Self-pay | Admitting: Orthopedic Surgery

## 2012-09-17 DIAGNOSIS — M25569 Pain in unspecified knee: Secondary | ICD-10-CM

## 2012-09-17 DIAGNOSIS — R609 Edema, unspecified: Secondary | ICD-10-CM | POA: Insufficient documentation

## 2012-09-17 DIAGNOSIS — M7989 Other specified soft tissue disorders: Secondary | ICD-10-CM

## 2012-09-17 DIAGNOSIS — M79609 Pain in unspecified limb: Secondary | ICD-10-CM | POA: Insufficient documentation

## 2012-09-17 NOTE — Progress Notes (Signed)
VASCULAR LAB PRELIMINARY  PRELIMINARY  PRELIMINARY  PRELIMINARY  Left lower extremity venous duplex completed.    Preliminary report:  Left:  No evidence of DVT, superficial thrombosis, or Baker's cyst.  Pete Merten, RVS 09/17/2012, 5:06 PM

## 2012-09-23 ENCOUNTER — Other Ambulatory Visit: Payer: Self-pay | Admitting: Internal Medicine

## 2012-09-23 ENCOUNTER — Inpatient Hospital Stay
Admission: RE | Admit: 2012-09-23 | Discharge: 2012-09-23 | Disposition: A | Discharge status: Home / Self Care | Payer: Managed Care, Other (non HMO) | Source: Ambulatory Visit | Attending: Internal Medicine | Admitting: Internal Medicine

## 2012-09-23 ENCOUNTER — Emergency Department (HOSPITAL_COMMUNITY): Payer: Managed Care, Other (non HMO)

## 2012-09-23 ENCOUNTER — Emergency Department (HOSPITAL_COMMUNITY)
Admission: EM | Admit: 2012-09-23 | Discharge: 2012-09-23 | Disposition: A | Discharge status: Home / Self Care | Payer: Managed Care, Other (non HMO) | Attending: Emergency Medicine | Admitting: Emergency Medicine

## 2012-09-23 ENCOUNTER — Encounter (HOSPITAL_COMMUNITY): Payer: Self-pay | Admitting: *Deleted

## 2012-09-23 DIAGNOSIS — R509 Fever, unspecified: Secondary | ICD-10-CM | POA: Insufficient documentation

## 2012-09-23 DIAGNOSIS — Z8659 Personal history of other mental and behavioral disorders: Secondary | ICD-10-CM | POA: Insufficient documentation

## 2012-09-23 DIAGNOSIS — Z96659 Presence of unspecified artificial knee joint: Secondary | ICD-10-CM | POA: Insufficient documentation

## 2012-09-23 DIAGNOSIS — Z95818 Presence of other cardiac implants and grafts: Secondary | ICD-10-CM | POA: Insufficient documentation

## 2012-09-23 DIAGNOSIS — J40 Bronchitis, not specified as acute or chronic: Secondary | ICD-10-CM

## 2012-09-23 DIAGNOSIS — Z860100 Personal history of colon polyps, unspecified: Secondary | ICD-10-CM | POA: Insufficient documentation

## 2012-09-23 DIAGNOSIS — I2510 Atherosclerotic heart disease of native coronary artery without angina pectoris: Secondary | ICD-10-CM | POA: Insufficient documentation

## 2012-09-23 DIAGNOSIS — I10 Essential (primary) hypertension: Secondary | ICD-10-CM | POA: Insufficient documentation

## 2012-09-23 DIAGNOSIS — K219 Gastro-esophageal reflux disease without esophagitis: Secondary | ICD-10-CM | POA: Insufficient documentation

## 2012-09-23 DIAGNOSIS — IMO0001 Reserved for inherently not codable concepts without codable children: Secondary | ICD-10-CM | POA: Insufficient documentation

## 2012-09-23 DIAGNOSIS — R7989 Other specified abnormal findings of blood chemistry: Secondary | ICD-10-CM

## 2012-09-23 DIAGNOSIS — R791 Abnormal coagulation profile: Secondary | ICD-10-CM | POA: Insufficient documentation

## 2012-09-23 DIAGNOSIS — E785 Hyperlipidemia, unspecified: Secondary | ICD-10-CM | POA: Insufficient documentation

## 2012-09-23 DIAGNOSIS — Z79899 Other long term (current) drug therapy: Secondary | ICD-10-CM | POA: Insufficient documentation

## 2012-09-23 DIAGNOSIS — J209 Acute bronchitis, unspecified: Secondary | ICD-10-CM | POA: Insufficient documentation

## 2012-09-23 DIAGNOSIS — Z8719 Personal history of other diseases of the digestive system: Secondary | ICD-10-CM | POA: Insufficient documentation

## 2012-09-23 DIAGNOSIS — M479 Spondylosis, unspecified: Secondary | ICD-10-CM | POA: Insufficient documentation

## 2012-09-23 LAB — POCT I-STAT, CHEM 8
Creatinine, Ser: 1 mg/dL (ref 0.50–1.35)
Glucose, Bld: 111 mg/dL — ABNORMAL HIGH (ref 70–99)
Hemoglobin: 11.6 g/dL — ABNORMAL LOW (ref 13.0–17.0)
Sodium: 140 mEq/L (ref 135–145)
TCO2: 27 mmol/L (ref 0–100)

## 2012-09-23 MED ORDER — IPRATROPIUM BROMIDE 0.02 % IN SOLN: 0.5000 mg | Freq: Once | RESPIRATORY_TRACT | Status: DC

## 2012-09-23 MED ORDER — IOHEXOL 350 MG/ML SOLN
100.0000 mL | Freq: Once | INTRAVENOUS | Status: AC | PRN
  Administered 2012-09-23: 100 mL via INTRAVENOUS

## 2012-09-23 MED ORDER — ALBUTEROL SULFATE (5 MG/ML) 0.5% IN NEBU: 5.0000 mg | INHALATION_SOLUTION | Freq: Once | RESPIRATORY_TRACT | Status: DC

## 2012-09-23 NOTE — ED Notes (Signed)
Pt returned from CT and placed back on monitor.

## 2012-09-23 NOTE — ED Notes (Signed)
Pt had left knee surgery 3 weeks ago and for the last three weeks has been having sob and not sleeping.  Saw MD yesterday and had cxr with labs and lab work indicating possible.  Dr. Earl Gala office called with results of d-dimer of 18 and want a CT chest to r/o PE.

## 2012-09-23 NOTE — ED Notes (Signed)
Bruise to pts rt hand. Pt requesting ice.

## 2012-09-23 NOTE — ED Notes (Signed)
Wife pt at nurses station asking "what exactly are we waiting on." Per Dr. Effie Shy we were waiting on records being faxed for patient to assess renal function before ordering Ct. Renal function was not checked so Dr. Effie Shy ordered istat just now. Wife overheard on the phone stating, "we were told to rush right over here and larry wasn't even to take a shower. I don't understand why its taking so long." Reexplained to pt and family now labs were ordered and results should be back in the next half an hour or so. Pt and family looking displeased with explaination. Pt stated, "well I hope I just die right here in this bed since they dont think anything is wrong with me." Reassured pt and family they we were working as a team to take of the patient and at present all vital signs are stable and none of his current symptoms require treatment.

## 2012-09-23 NOTE — ED Provider Notes (Signed)
History     CSN: [id number]  Arrival date & time 09/23/12  0912   First MD Initiated Contact with Patient 09/23/12 1023      Chief Complaint  Patient presents with  . R/O blood clot     (Consider location/radiation/quality/duration/timing/severity/associated sxs/prior treatment) HPI Comments: Samuel Chambers is a 62 y.o. Male who saw his doctor yesterday for generalized achiness, fever, and shortness of breath. He had a chest x-ray and labs done. He was called this morning, and told his d-dimer was 18, and that he should come to the ED, for a CT CT scan of his chest. He had increased difficulty walking starting yesterday, because of his shortness of breath. He denies cough, fever, chills, nausea, or vomiting. He is taking her oxycodone for left knee pain. He had a left knee joint replacement about 3 weeks ago. His postoperative course has been complicated by left leg swelling. He had a Doppler 6 days ago, of the left leg that was negative. He denies dysuria, or change in bowel habits. There are no other modifying factors.  The history is provided by the patient.    Past Medical History  Diagnosis Date  . Diverticulosis   . Internal hemorrhoids   . Adenomatous colon polyp 03/2005  . Hypertension   . Hyperlipidemia   . Anxiety   . GERD (gastroesophageal reflux disease)   . Degenerative joint disease   . CAD (coronary artery disease)   . PONV (postoperative nausea and vomiting)     Past Surgical History  Procedure Laterality Date  . Hip surgery      complete  . Heel spur surgery    . Hemorrhoid surgery    . Knee arthroscopy  03/27/2012    Procedure: ARTHROSCOPY KNEE;  Surgeon: Loanne Drilling, MD;  Location: [facility];  Service: Orthopedics;  Laterality: Left;  . Coronary angioplasty with stent placement      2007  . Total knee arthroplasty Left 09/04/2012    Procedure: TOTAL KNEE ARTHROPLASTY;  Surgeon: Loanne Drilling, MD;  Location: [facility];  Service: Orthopedics;  Laterality:  Left;    Family History  Problem Relation Age of Onset  . Breast cancer Mother   . Prostate cancer Father   . Diabetes Mother     History  Substance Use Topics  . Smoking status: Never Smoker   . Smokeless tobacco: Never Used  . Alcohol Use: No      Review of Systems  All other systems reviewed and are negative.    Allergies  Oxycodone and Warfarin and related  Home Medications   Current Outpatient Rx  Name  Route  Sig  Dispense  Refill  . acetaminophen (TYLENOL) 500 MG tablet   Oral   Take 1,000 mg by mouth every 6 (six) hours as needed. Pain         . DULoxetine (CYMBALTA) 60 MG capsule   Oral   Take 60 mg by mouth every morning.          Marland Kitchen ibuprofen (ADVIL,MOTRIN) 200 MG tablet   Oral   Take 200 mg by mouth every 6 (six) hours as needed for pain.         Marland Kitchen levofloxacin (LEVAQUIN) 250 MG tablet   Oral   Take 250 mg by mouth daily. Take for 10 days, first dose on 09/22/12         . losartan (COZAAR) 100 MG tablet   Oral   Take 100 mg by  mouth every morning.         . methocarbamol (ROBAXIN) 500 MG tablet   Oral   Take 1 tablet (500 mg total) by mouth every 6 (six) hours as needed.   80 tablet   0   . metoprolol succinate (TOPROL-XL) 50 MG 24 hr tablet   Oral   Take 50 mg by mouth every morning. Take with or immediately following a meal.         . ranitidine (ZANTAC) 150 MG capsule   Oral   Take 150 mg by mouth daily.          . rivaroxaban (XARELTO) 10 MG TABS tablet   Oral   Take 1 tablet (10 mg total) by mouth daily with breakfast. Take Xarelto for two and a half more weeks, then discontinue Xarelto. Once the patient has completed the Xarelto, they may resume the 81 mg Aspirin and the 75 mg Plavix.   19 tablet   0     BP 132/72  Pulse 80  Temp(Src) 99.6 F (37.6 C) (Oral)  Resp 16  SpO2 99%  Physical Exam  Nursing note and vitals reviewed. Constitutional: He is oriented to person, place, and time. He appears  well-developed and well-nourished.  HENT:  Head: Normocephalic and atraumatic.  Right Ear: External ear normal.  Left Ear: External ear normal.  Eyes: Conjunctivae and EOM are normal. Pupils are equal, round, and reactive to light.  Neck: Normal range of motion and phonation normal. Neck supple.  Cardiovascular: Normal rate, regular rhythm, normal heart sounds and intact distal pulses.   Pulmonary/Chest: Effort normal and breath sounds normal. He exhibits no bony tenderness.  Abdominal: Soft. Normal appearance. There is no tenderness.  Musculoskeletal:  Left knee mild effusion. No significant localized tenderness of the left knee. Left knee, wound well healed. No left calf tenderness. No significant left lower leg swelling  Neurological: He is alert and oriented to person, place, and time. He has normal strength. No cranial nerve deficit or sensory deficit. He exhibits normal muscle tone. Coordination normal.  Skin: Skin is warm, dry and intact.  Psychiatric: He has a normal mood and affect. His behavior is normal. Judgment and thought content normal.    ED Course  Procedures (including critical care time)  Labs Reviewed  POCT I-STAT, CHEM 8 - Abnormal; Notable for the following:    Glucose, Bld 111 (*)    Hemoglobin 11.6 (*)    HCT 34.0 (*)    All other components within normal limits   Ct Angio Chest Pe W/cm &/or Wo Cm  09/23/2012  *RADIOLOGY REPORT*  Clinical Data: Shortness of breath.  Knee surgery 3 weeks ago. Decreased ambulation.  CT ANGIOGRAPHY CHEST  Technique:  Multidetector CT imaging of the chest using the standard protocol during bolus administration of intravenous contrast. Multiplanar reconstructed images including MIPs were obtained and reviewed to evaluate the vascular anatomy.  Contrast: OMNIPAQUE IOHEXOL 350 MG/ML SOLN  Comparison: 11/03/2006.  Findings: Respiratory motion somewhat degrades image quality.  No pulmonary embolus.  No pathologically enlarged  mediastinal, hilar or axillary lymph nodes.  Heart is at the upper limits of normal in size.  Coronary artery calcification.  No pericardial effusion.  Expiratory phase imaging leads to added density throughout the lungs bilaterally.  Mild dependent atelectasis minimal dependent atelectasis bilaterally.  Lungs are otherwise clear.  No pleural fluid.  Airway is otherwise unremarkable.  Liver is decreased attenuation diffusely with some areas of peripheral sparing.  No worrisome lytic or sclerotic lesions. Degenerative changes are seen in the spine.  IMPRESSION:  1.  Negative for pulmonary embolus. 2.  Coronary artery calcification. 3.  Hepatic steatosis.   Original Report Authenticated By: Leanna Battles, M.D.      1. Bronchitis       MDM  Nonspecific dyspnea, with elevated d-dimer, postoperatively. The diagnostic efficacy of the d-dimer, to evaluate for PE, is negligible in this scenario. I do not believe that a VQ scan can effectively rule out a PE. CT angiogram was ordered, to evaluate for PE.         Flint Melter, MD 09/23/12 [phone]

## 2013-01-07 ENCOUNTER — Emergency Department (HOSPITAL_COMMUNITY)
Admission: EM | Admit: 2013-01-07 | Discharge: 2013-01-07 | Disposition: A | Discharge status: Home / Self Care | Payer: Managed Care, Other (non HMO) | Attending: Emergency Medicine | Admitting: Emergency Medicine

## 2013-01-07 ENCOUNTER — Encounter (HOSPITAL_COMMUNITY): Payer: Self-pay | Admitting: Emergency Medicine

## 2013-01-07 ENCOUNTER — Emergency Department (HOSPITAL_COMMUNITY): Payer: Managed Care, Other (non HMO)

## 2013-01-07 DIAGNOSIS — E669 Obesity, unspecified: Secondary | ICD-10-CM | POA: Insufficient documentation

## 2013-01-07 DIAGNOSIS — I10 Essential (primary) hypertension: Secondary | ICD-10-CM | POA: Insufficient documentation

## 2013-01-07 DIAGNOSIS — Z79899 Other long term (current) drug therapy: Secondary | ICD-10-CM | POA: Insufficient documentation

## 2013-01-07 DIAGNOSIS — R0602 Shortness of breath: Secondary | ICD-10-CM | POA: Insufficient documentation

## 2013-01-07 DIAGNOSIS — R110 Nausea: Secondary | ICD-10-CM | POA: Insufficient documentation

## 2013-01-07 DIAGNOSIS — Z8719 Personal history of other diseases of the digestive system: Secondary | ICD-10-CM | POA: Insufficient documentation

## 2013-01-07 DIAGNOSIS — Z7982 Long term (current) use of aspirin: Secondary | ICD-10-CM | POA: Insufficient documentation

## 2013-01-07 DIAGNOSIS — R609 Edema, unspecified: Secondary | ICD-10-CM | POA: Insufficient documentation

## 2013-01-07 DIAGNOSIS — I2510 Atherosclerotic heart disease of native coronary artery without angina pectoris: Secondary | ICD-10-CM | POA: Insufficient documentation

## 2013-01-07 DIAGNOSIS — F411 Generalized anxiety disorder: Secondary | ICD-10-CM | POA: Insufficient documentation

## 2013-01-07 DIAGNOSIS — R1011 Right upper quadrant pain: Secondary | ICD-10-CM | POA: Insufficient documentation

## 2013-01-07 DIAGNOSIS — M7989 Other specified soft tissue disorders: Secondary | ICD-10-CM | POA: Insufficient documentation

## 2013-01-07 DIAGNOSIS — R141 Gas pain: Secondary | ICD-10-CM | POA: Insufficient documentation

## 2013-01-07 DIAGNOSIS — Z860100 Personal history of colon polyps, unspecified: Secondary | ICD-10-CM | POA: Insufficient documentation

## 2013-01-07 DIAGNOSIS — R0789 Other chest pain: Secondary | ICD-10-CM | POA: Insufficient documentation

## 2013-01-07 DIAGNOSIS — Z7902 Long term (current) use of antithrombotics/antiplatelets: Secondary | ICD-10-CM | POA: Insufficient documentation

## 2013-01-07 DIAGNOSIS — M546 Pain in thoracic spine: Secondary | ICD-10-CM | POA: Insufficient documentation

## 2013-01-07 DIAGNOSIS — R61 Generalized hyperhidrosis: Secondary | ICD-10-CM | POA: Insufficient documentation

## 2013-01-07 DIAGNOSIS — M1990 Unspecified osteoarthritis, unspecified site: Secondary | ICD-10-CM | POA: Insufficient documentation

## 2013-01-07 DIAGNOSIS — Z8679 Personal history of other diseases of the circulatory system: Secondary | ICD-10-CM | POA: Insufficient documentation

## 2013-01-07 DIAGNOSIS — R142 Eructation: Secondary | ICD-10-CM | POA: Insufficient documentation

## 2013-01-07 DIAGNOSIS — K219 Gastro-esophageal reflux disease without esophagitis: Secondary | ICD-10-CM | POA: Insufficient documentation

## 2013-01-07 DIAGNOSIS — E785 Hyperlipidemia, unspecified: Secondary | ICD-10-CM | POA: Insufficient documentation

## 2013-01-07 LAB — POCT I-STAT TROPONIN I: Troponin i, poc: 0.02 ng/mL (ref 0.00–0.08)

## 2013-01-07 LAB — COMPREHENSIVE METABOLIC PANEL
Albumin: 4.1 g/dL (ref 3.5–5.2)
Alkaline Phosphatase: 72 U/L (ref 39–117)
BUN: 16 mg/dL (ref 6–23)
CO2: 25 mEq/L (ref 19–32)
Chloride: 100 mEq/L (ref 96–112)
Creatinine, Ser: 0.83 mg/dL (ref 0.50–1.35)
GFR calc non Af Amer: >90 mL/min (ref >90)
Glucose, Bld: 141 mg/dL — ABNORMAL HIGH (ref 70–99)
Potassium: 4.8 mEq/L (ref 3.5–5.1)
Total Bilirubin: 0.3 mg/dL (ref 0.3–1.2)

## 2013-01-07 LAB — CBC
HCT: 41.6 % (ref 39.0–52.0)
Hemoglobin: 14.6 g/dL (ref 13.0–17.0)
MCV: 78.5 fL (ref 78.0–100.0)
RBC: 5.3 MIL/uL (ref 4.22–5.81)
WBC: 8.4 10*3/uL (ref 4.0–10.5)

## 2013-01-07 LAB — LIPASE, BLOOD: Lipase: 31 U/L (ref 11–59)

## 2013-01-07 MED ORDER — MORPHINE SULFATE 2 MG/ML IJ SOLN
2.0000 mg | Freq: Once | INTRAMUSCULAR | Status: AC
  Administered 2013-01-07: 2 mg via INTRAVENOUS
  Filled 2013-01-07 (×2): qty 1

## 2013-01-07 MED ORDER — GI COCKTAIL ~~LOC~~: 30.0000 mL | Freq: Once | ORAL | Status: DC

## 2013-01-07 MED ORDER — MORPHINE SULFATE 2 MG/ML IJ SOLN: 2.0000 mg | Freq: Once | INTRAMUSCULAR | Status: DC

## 2013-01-07 MED ORDER — ONDANSETRON HCL 4 MG/2ML IJ SOLN
4.0000 mg | Freq: Once | INTRAMUSCULAR | Status: AC
  Administered 2013-01-07: 4 mg via INTRAVENOUS
  Filled 2013-01-07 (×2): qty 2

## 2013-01-07 NOTE — ED Provider Notes (Signed)
I saw and evaluated the patient, reviewed the resident's note and I agree with the findings and plan.   Loren Racer, MD 01/07/13 [phone]

## 2013-01-07 NOTE — ED Notes (Signed)
Pt discharged.Vital signs stable and GCS 15 

## 2013-01-07 NOTE — ED Notes (Signed)
Pt brought to room via wheelchair from triage; pt getting undressed and into a gown at this time

## 2013-01-07 NOTE — ED Provider Notes (Signed)
History     CSN: [id number]  Arrival date & time 01/07/13  4098   First MD Initiated Contact with Patient 01/07/13 0746      Chief Complaint  Patient presents with  . Chest Pain   (Consider location/radiation/quality/duration/timing/severity/associated sxs/prior treatment) HPI Comments: Samuel Chambers is a 62 year old white male with PMH of moderate R CAD (cath 04/2007 s/p PROMUS stent in mid AV groove, s/p L total hip arthroplasty 09/2006 and L knee arthorplasty 08/2012 on Plavix and ASA, fibromyalgia on Plavix, and HTN presenting to [facility] today with severe right sided chest pain under right breast radiating to right mid back.  He explains he woke up this morning around 530am feeling uncomfortable with gradual worsening of pain through the morning to the point where he could not take it anymore.  The pain is sharp in nature, stabbing like, 10+ on pain scale, constant, non-tender to palpation, associated with shortness of breath, belching, nausea, and diaphoresis, with no alleviating or exacerbating factors.  He had a similar episode of chest pain approximately 2 weeks ago that resolved in 2-3 hours with no relief of nitroglycerin at that time.  He did not take or try anything for the pain today.  He does report taking his morning medications including Plavix, ASA, and his blood pressure medications and was able to eat some cereal.  He denies any fever, chills, vomiting, headache, vision disturbances, palpitations, diarrhea, constipation, or any urinary complaints.  He feels bloated, has been burping a lot, denies recent travel or change in daily routine or food.  He is s/p R knee surgery Feb 2014.    His wife is present in the room as well.    Patient is a 62 y.o. male presenting with chest pain. The history is provided by the patient and the spouse. No language interpreter was used.  Chest Pain Pain location:  R chest Pain quality: sharp and stabbing   Pain radiates to:  Mid back Pain radiates to  the back: yes   Pain severity:  Severe Onset quality:  Sudden Duration:  3 hours Timing:  Constant Progression:  Worsening Chronicity:  Recurrent Context: at rest   Relieved by:  Nothing Worsened by:  Nothing tried Ineffective treatments:  Rest Associated symptoms: abdominal pain, back pain, diaphoresis, lower extremity edema, nausea and shortness of breath   Associated symptoms: no altered mental status, no anorexia, no cough, no dizziness, no fatigue, no fever, no headache, no near-syncope, no palpitations, no syncope, not vomiting and no weakness   Abdominal pain:    Location:  RUQ   Quality:  Sharp and bloating   Severity:  Moderate   Onset quality:  Gradual   Duration:  3 hours   Timing:  Constant   Progression:  Worsening   Chronicity:  Recurrent Nausea:    Severity:  Mild   Onset quality:  Gradual   Timing:  Constant   Progression:  Unchanged Shortness of breath:    Severity:  Mild   Onset quality:  Gradual   Timing:  Intermittent   Progression:  Unchanged (worse with pain) Risk factors: coronary artery disease, hypertension, male sex, obesity and surgery   Risk factors: no diabetes mellitus, no prior DVT/PE and no smoking   Risk factors comment:  PROMUS , R knee surgery 08/2012   Past Medical History  Diagnosis Date  . Diverticulosis   . Internal hemorrhoids   . Adenomatous colon polyp 03/2005  . Hypertension   . Hyperlipidemia   .  Anxiety   . GERD (gastroesophageal reflux disease)   . Degenerative joint disease   . CAD (coronary artery disease)   . PONV (postoperative nausea and vomiting)    Past Surgical History  Procedure Laterality Date  . Hip surgery      complete  . Heel spur surgery    . Hemorrhoid surgery    . Knee arthroscopy  03/27/2012    Procedure: ARTHROSCOPY KNEE;  Surgeon: Loanne Drilling, MD;  Location: [facility];  Service: Orthopedics;  Laterality: Left;  . Coronary angioplasty with stent placement      2007  . Total knee arthroplasty  Left 09/04/2012    Procedure: TOTAL KNEE ARTHROPLASTY;  Surgeon: Loanne Drilling, MD;  Location: [facility];  Service: Orthopedics;  Laterality: Left;   Family History  Problem Relation Age of Onset  . Breast cancer Mother   . Prostate cancer Father   . Diabetes Mother    History  Substance Use Topics  . Smoking status: Never Smoker   . Smokeless tobacco: Never Used  . Alcohol Use: No    Review of Systems  Constitutional: Positive for diaphoresis. Negative for fever and fatigue.       Diaphoresis  HENT: Negative.   Eyes: Negative.  Negative for visual disturbance.  Respiratory: Positive for chest tightness and shortness of breath. Negative for cough.   Cardiovascular: Positive for chest pain and leg swelling. Negative for palpitations, syncope and near-syncope.  Gastrointestinal: Positive for nausea, abdominal pain and abdominal distention. Negative for vomiting, diarrhea, anal bleeding and anorexia.  Endocrine: Negative.   Genitourinary: Negative.  Negative for dysuria, flank pain and difficulty urinating.  Musculoskeletal: Positive for back pain.       S/p R knee surgery  Skin: Negative.   Allergic/Immunologic: Negative.   Neurological: Negative for dizziness, syncope, weakness, light-headedness and headaches.  Hematological:       On Plavix and ASA  Psychiatric/Behavioral: Negative.  Negative for altered mental status.   Allergies  Oxycodone and Warfarin and related  Home Medications   Current Outpatient Rx  Name  Route  Sig  Dispense  Refill  . acetaminophen (TYLENOL) 500 MG tablet   Oral   Take 1,000 mg by mouth every 6 (six) hours as needed. Pain         . aspirin EC 81 MG tablet   Oral   Take 81 mg by mouth daily.         . clopidogrel (PLAVIX) 75 MG tablet   Oral   Take 75 mg by mouth daily.         . DULoxetine (CYMBALTA) 60 MG capsule   Oral   Take 60 mg by mouth every morning.          Marland Kitchen ibuprofen (ADVIL,MOTRIN) 200 MG tablet   Oral   Take 400  mg by mouth every 6 (six) hours as needed for pain.          Marland Kitchen losartan (COZAAR) 100 MG tablet   Oral   Take 100 mg by mouth every morning.         . metoprolol succinate (TOPROL-XL) 50 MG 24 hr tablet   Oral   Take 50 mg by mouth every morning. Take with or immediately following a meal.         . Omega-3 Fatty Acids (FISH OIL PO)   Oral   Take 1 tablet by mouth daily.         . ranitidine (ZANTAC)  150 MG capsule   Oral   Take 150 mg by mouth daily.           BP 127/83  Pulse 84  Temp(Src) 97.6 F (36.4 C) (Oral)  Resp 21  SpO2 97%  Physical Exam  Constitutional: He is oriented to person, place, and time. He appears well-developed and well-nourished. He appears distressed.  Painful distress  HENT:  Head: Normocephalic and atraumatic.  Eyes: EOM are normal. Pupils are equal, round, and reactive to light.  Neck: Normal range of motion. Neck supple.  Cardiovascular: Normal rate, regular rhythm, normal heart sounds and intact distal pulses.   Pulmonary/Chest: Effort normal and breath sounds normal.  Abdominal: Soft. Bowel sounds are normal. He exhibits distension. There is tenderness. There is guarding. There is no rebound.  Tenderness to deep palpation RUQ, voluntary guarding  Musculoskeletal: He exhibits edema. He exhibits no tenderness.  +1 pitting edema RLE, +2 pitting edema LLE, surgical scar L knee  No tenderness to palpation of chest and back  Neurological: He is alert and oriented to person, place, and time. No cranial nerve deficit.  Strength 5/5 b/l upper extremities, 5/5 RLE 4/5 LLE  Skin: Skin is dry. No rash noted.  Psychiatric: He has a normal mood and affect. His behavior is normal. Judgment and thought content normal.    ED Course  Procedures (including critical care time)  Labs Reviewed  COMPREHENSIVE METABOLIC PANEL - Abnormal; Notable for the following:    Glucose, Bld 141 (*)    AST 39 (*)    All other components within normal limits   CBC  LIPASE, BLOOD  POCT I-STAT TROPONIN I  POCT I-STAT TROPONIN I   Dg Chest 2 View  01/07/2013   *RADIOLOGY REPORT*  Clinical Data: Right-sided back pain.  Pain below the scapula.  CHEST - 2 VIEW  Comparison: 09/23/2012.  Findings:  Cardiopericardial silhouette within normal limits. Mediastinal contours normal. Trachea midline.  No airspace disease or effusion.  IMPRESSION: No active cardiopulmonary disease.   Original Report Authenticated By: Andreas Newport, M.D.   US Abdomen Complete  01/07/2013   *RADIOLOGY REPORT*  Clinical Data:  Right upper quadrant abdominal pain.  History of cholelithiasis.  COMPLETE ABDOMINAL ULTRASOUND  Comparison:  07/03/2012 and 08/07/2005.  Findings:  Gallbladder:  No gallstones, wall thickening or pericholecystic fluid.  The patient is not tender over the gallbladder.  Common bile duct:  Normal in caliber, measuring 5.4 mm in maximum diameter proximally.  Liver:  More homogeneously diffusely echogenic than previously seen.  IVC:  Poorly visualized.  Grossly unremarkable.  Pancreas:  Poorly visualized.  Spleen:  Normal, measuring 10.8 cm in length.  Right Kidney:  Normal, measuring 11.7 cm in length.  Left Kidney:  Diffusely prominent renal sinus fat with mild diffuse cortical thickening.  Otherwise, normal, measuring 13.0 cm in length.  Abdominal aorta:  No aneurysm identified.  IMPRESSION:  1.  The previously seen gallstone in the gallbladder is no longer visualized. 2.  Mildly progressive diffuse hepatic steatosis. 3.  Poorly visualized pancreas and inferior vena cava.   Original Report Authenticated By: Beckie Salts, M.D.     No diagnosis found.   Date: 01/07/2013  Rate: 78bpm   Rhythm: normal sinus rhythm  QRS Axis: normal  Intervals: normal  ST/T Wave abnormalities: nonspecific T wave changes; t wave flattening lead III  Conduction Disutrbances:none  Narrative Interpretation: 78bpm NSR   Old EKG Reviewed: 09/23/2012: 91bpm, NSR  MDM  Samuel Chambers is a 62  year old male with PMH CAD s/p PROMUS stent, HTN, and s/p L hip and knee total arthroplasty presenting with sharp right sided pain under breast and radiating across to back since this morning associated with nausea. Tenderness to palpation RUQ. Atypical chest pain presentation with concern for possible biliary colic secondary to ?cholelithiasis (hx of gallstone on prior ultrasound) vs. MSK vs. GERD are included in differentials. PE less likely with modified geneva score of 3, low probability, no tachycardia, no recent travel or lower extremity pain, and surgery was several months ago.  He has been on plavix and asa.  -morphine iv 2mg  x2 -zofran 4mg  x1 -cxr no active cardiopulmonary disease -istat troponin 0.02 -follow up troponin negative -cbc Hb 14.6 -cmet glucose 141, AST mildly elevated 39 -lipase negative; 31 -abdominal ultrasound: No gall stones, mildly progressive diffuse hepatic steatosis, and poorly visualized pancreas and inferior vena cava -pain resolved, refused any more pain medication and gi cocktail -had long discussion in regards to lab results with myself and then Dr. Ranae Palms -Samuel Chambers will follow up with his PCP and general surgery as an outpatient, and perhaps further testing ?HIDA scan. -d/c home with follow up advised  Case discussed with Dr. Arnetha Gula, MD 01/07/13 1426

## 2013-01-07 NOTE — ED Notes (Signed)
Pt c/o right sided upper back into chest pain starting this am with nausea and diaphoresis

## 2013-01-07 NOTE — ED Notes (Signed)
Pt reports rt side chest and back pain that is constant and stabbing 10/10.  Pt states pain started 5:30am and has progressed.  Pt has had same episode 2 weeks ago.   Pt has nausea and sweats denies vomiting.  Pt has stent in heart from 6 yrs ago.

## 2013-01-28 ENCOUNTER — Ambulatory Visit (INDEPENDENT_AMBULATORY_CARE_PROVIDER_SITE_OTHER): Payer: Managed Care, Other (non HMO) | Admitting: Surgery

## 2013-01-28 ENCOUNTER — Encounter (INDEPENDENT_AMBULATORY_CARE_PROVIDER_SITE_OTHER): Payer: Self-pay | Admitting: Surgery

## 2013-01-28 VITALS — BP 130/90 | HR 100 | Temp 97.8°F | Resp 14 | Ht 71.0 in | Wt 269.6 lb

## 2013-01-28 DIAGNOSIS — K8020 Calculus of gallbladder without cholecystitis without obstruction: Secondary | ICD-10-CM

## 2013-01-28 NOTE — Progress Notes (Signed)
Chief Complaint:  Recurrent attacks of midepigastric pain radiating into the back with gallstones on ultrasound  History of Present Illness:  Samuel Chambers is an 61 y.o. male comes in today with his wife with a history of epigastric pain attacks. The first of these occurred over Memorial Day weekend and was a classic attack awakening him in the melanotic and then lasting for about 4 hours. He had a subsequent recurrence on the way to work causing him to drive back to [place] and this again was fairly classic in the sense of the pain was the same as before. His very first ultrasound exam showed the 0.9 mm stone in the neck of the gallbladder but that was not seen on subsequent exam. I believe his symptoms and history that worked pretty classic for gallbladder attacks.  I discussed the procedure with him in detail and gave him a booklet on the procedure. We discussed common bile duct injuries bile leaks bleeding bowel injuries and he is aware of the risk and benefits of the procedure. We will go and proceed to schedule this at [place] his convenience.  Past Medical History  Diagnosis Date  . Diverticulosis   . Internal hemorrhoids   . Adenomatous colon polyp 03/2005  . Hypertension   . Hyperlipidemia   . Anxiety   . GERD (gastroesophageal reflux disease)   . Degenerative joint disease   . CAD (coronary artery disease)   . PONV (postoperative nausea and vomiting)   . Neuromuscular disorder     Past Surgical History  Procedure Laterality Date  . Hip surgery      complete  . Heel spur surgery    . Hemorrhoid surgery    . Knee arthroscopy  03/27/2012    Procedure: ARTHROSCOPY KNEE;  Surgeon: Frank V Aluisio, MD;  Location: [facility];  Service: Orthopedics;  Laterality: Left;  . Coronary angioplasty with stent placement      2007  . Total knee arthroplasty Left 09/04/2012    Procedure: TOTAL KNEE ARTHROPLASTY;  Surgeon: Frank V Aluisio, MD;  Location: [facility];  Service: Orthopedics;   Laterality: Left;    Current Outpatient Prescriptions  Medication Sig Dispense Refill  . acetaminophen (TYLENOL) 500 MG tablet Take 1,000 mg by mouth every 6 (six) hours as needed. Pain      . aspirin EC 81 MG tablet Take 81 mg by mouth daily.      . clopidogrel (PLAVIX) 75 MG tablet Take 75 mg by mouth daily.      . DULoxetine (CYMBALTA) 60 MG capsule Take 60 mg by mouth every morning.       . esomeprazole (NEXIUM) 20 MG capsule Take 20 mg by mouth daily before breakfast.      . hyoscyamine (LEVSIN SL) 0.125 MG SL tablet       . losartan (COZAAR) 100 MG tablet Take 100 mg by mouth every morning.      . metoprolol succinate (TOPROL-XL) 50 MG 24 hr tablet Take 50 mg by mouth every morning. Take with or immediately following a meal.      . Omega-3 Fatty Acids (FISH OIL PO) Take 1 tablet by mouth daily.      . furosemide (LASIX) 20 MG tablet        No current facility-administered medications for this visit.   Oxycodone and Warfarin and related Family History  Problem Relation Age of Onset  . Breast cancer Mother   . Diabetes Mother   . Prostate   cancer Father    Social History:   reports that he has never smoked. He has never used smokeless tobacco. He reports that he does not drink alcohol or use illicit drugs.   REVIEW OF SYSTEMS - PERTINENT POSITIVES ONLY: No history of DVT. The patient has had a left knee as well as total hips. He does take Plavix for a single stent.  Physical Exam:   Blood pressure 130/90, pulse 100, temperature 97.8 F (36.6 C), temperature source Temporal, resp. rate 14, height 5' 11" (1.803 m), weight 269 lb 9.6 oz (122.29 kg). Body mass index is 37.62 kg/(m^2).  Gen:  WDWN white male NAD  Neurological: Alert and oriented to person, place, and time. Motor and sensory function is grossly intact  Head: Normocephalic and atraumatic.  Eyes: Conjunctivae are normal. Pupils are equal, round, and reactive to light. No scleral icterus.  Neck: Normal range of  motion. Neck supple. No tracheal deviation or thyromegaly present.  Cardiovascular:  SR without murmurs or gallops.  No carotid bruits Respiratory: Effort normal.  No respiratory distress. No chest wall tenderness. Breath sounds normal.  No wheezes, rales or rhonchi.  Abdomen:  nontender and mildly obese GU: Musculoskeletal: Normal range of motion. Extremities are nontender. No cyanosis, edema or clubbing noted Lymphadenopathy: No cervical, preauricular, postauricular or axillary adenopathy is present Skin: Skin is warm and dry. No rash noted. No diaphoresis. No erythema. No pallor. Pscyh: Normal mood and affect. Behavior is normal. Judgment and thought content normal.   LABORATORY RESULTS: No results found for this or any previous visit (from the past 48 hour(s)).  RADIOLOGY RESULTS: No results found.  Problem List: Patient Active Problem List   Diagnosis Date Noted  . Gallstones 01/28/2013  . OA (osteoarthritis) of knee 09/04/2012  . Acute medial meniscal tear 03/27/2012  . ANXIETY 02/24/2007  . HYPERTENSION 02/24/2007  . GERD 02/24/2007  . DEGENERATIVE JOINT DISEASE, GENERALIZED 02/24/2007  . CHEST PAIN 02/24/2007    Assessment & Plan: Symptomatic gallstone with recurrent attacks. Plan laparoscopic cholecystectomy with intraoperative cholangiogram and [place]    Matt B. Dianca Owensby, MD, FACS  [facility] [phone] beeper [phone]  01/28/2013 12:22 PM     

## 2013-02-12 ENCOUNTER — Encounter (HOSPITAL_COMMUNITY): Payer: Self-pay | Admitting: Pharmacy Technician

## 2013-02-12 NOTE — Patient Instructions (Addendum)
MAVERIK FOOT  02/12/2013   Your procedure is scheduled on:  02/18/13               Surgery - 0730am-0930am  Report to [facility] at     0530 AM.  Call this number if you have problems the morning of surgery: [phone]   Remember: Fleets enema nite before surgery    Do not eat food or drink liquids after midnight.   Take these medicines the morning of surgery with A SIP OF WATER:    Do not wear jewelry,  Do not wear lotions, powders, or perfumes.    Men may shave face and neck.  Do not bring valuables to the hospital.  Contacts, dentures or bridgework may not be worn into surgery.  Leave suitcase in the car. After surgery it may be brought to your room.  For patients admitted to the hospital, checkout time is 11:00 AM the day of  discharge.       SEE CHG INSTRUCTION SHEET    Please read over the following fact sheets that you were given coughing and deep breathing exercises, leg exercises               Failure to comply with these instructions may result in cancellation of your surgery.                Patient Signature ____________________________              Nurse Signature _____________________________

## 2013-02-15 ENCOUNTER — Encounter (HOSPITAL_COMMUNITY)
Admission: RE | Admit: 2013-02-15 | Discharge: 2013-02-15 | Disposition: A | Discharge status: Home / Self Care | Payer: Managed Care, Other (non HMO) | Source: Ambulatory Visit | Attending: Surgery | Admitting: Surgery

## 2013-02-15 ENCOUNTER — Encounter (HOSPITAL_COMMUNITY): Payer: Self-pay

## 2013-02-15 LAB — BASIC METABOLIC PANEL
BUN: 10 mg/dL (ref 6–23)
Calcium: 9 mg/dL (ref 8.4–10.5)
Creatinine, Ser: 0.91 mg/dL (ref 0.50–1.35)
GFR calc Af Amer: >90 mL/min (ref >90)

## 2013-02-15 LAB — CBC
HCT: 38.3 % — ABNORMAL LOW (ref 39.0–52.0)
MCH: 27.6 pg (ref 26.0–34.0)
MCV: 81.3 fL (ref 78.0–100.0)
Platelets: 227 10*3/uL (ref 150–400)
RDW: 14.5 % (ref 11.5–15.5)

## 2013-02-15 NOTE — Progress Notes (Signed)
Last office visit note - 08/03/12 Dr Herbie Baltimore on chart  ECHO 04/2002 on chart  11/2011 Stress Test on chart 08/03/12 EKG on chart

## 2013-02-15 NOTE — Progress Notes (Signed)
EKG 01/07/13 EPIC CXR 12/2012 EPIC

## 2013-02-18 ENCOUNTER — Ambulatory Visit (HOSPITAL_COMMUNITY): Payer: Managed Care, Other (non HMO) | Admitting: *Deleted

## 2013-02-18 ENCOUNTER — Encounter (HOSPITAL_COMMUNITY)
Admission: RE | Disposition: A | Discharge status: Home / Self Care | Payer: Self-pay | Source: Ambulatory Visit | Attending: Surgery

## 2013-02-18 ENCOUNTER — Encounter (HOSPITAL_COMMUNITY): Payer: Self-pay | Admitting: *Deleted

## 2013-02-18 ENCOUNTER — Ambulatory Visit (HOSPITAL_COMMUNITY)
Admission: RE | Admit: 2013-02-18 | Discharge: 2013-02-19 | Disposition: A | Discharge status: Home / Self Care | Payer: Managed Care, Other (non HMO) | Source: Ambulatory Visit | Attending: Surgery | Admitting: Surgery

## 2013-02-18 ENCOUNTER — Ambulatory Visit (HOSPITAL_COMMUNITY): Payer: Managed Care, Other (non HMO)

## 2013-02-18 DIAGNOSIS — I10 Essential (primary) hypertension: Secondary | ICD-10-CM | POA: Insufficient documentation

## 2013-02-18 DIAGNOSIS — K8010 Calculus of gallbladder with chronic cholecystitis without obstruction: Secondary | ICD-10-CM

## 2013-02-18 DIAGNOSIS — M1710 Unilateral primary osteoarthritis, unspecified knee: Secondary | ICD-10-CM | POA: Insufficient documentation

## 2013-02-18 DIAGNOSIS — E785 Hyperlipidemia, unspecified: Secondary | ICD-10-CM | POA: Insufficient documentation

## 2013-02-18 DIAGNOSIS — K828 Other specified diseases of gallbladder: Secondary | ICD-10-CM | POA: Insufficient documentation

## 2013-02-18 DIAGNOSIS — K219 Gastro-esophageal reflux disease without esophagitis: Secondary | ICD-10-CM | POA: Insufficient documentation

## 2013-02-18 DIAGNOSIS — Z9049 Acquired absence of other specified parts of digestive tract: Secondary | ICD-10-CM

## 2013-02-18 DIAGNOSIS — I2510 Atherosclerotic heart disease of native coronary artery without angina pectoris: Secondary | ICD-10-CM | POA: Insufficient documentation

## 2013-02-18 DIAGNOSIS — Z79899 Other long term (current) drug therapy: Secondary | ICD-10-CM | POA: Insufficient documentation

## 2013-02-18 DIAGNOSIS — K660 Peritoneal adhesions (postprocedural) (postinfection): Secondary | ICD-10-CM | POA: Insufficient documentation

## 2013-02-18 HISTORY — PX: CHOLECYSTECTOMY: SHX55

## 2013-02-18 LAB — CBC
MCH: 28 pg (ref 26.0–34.0)
Platelets: 198 10*3/uL (ref 150–400)
RBC: 4.5 MIL/uL (ref 4.22–5.81)
WBC: 7.6 10*3/uL (ref 4.0–10.5)

## 2013-02-18 LAB — CREATININE, SERUM: Creatinine, Ser: 0.89 mg/dL (ref 0.50–1.35)

## 2013-02-18 SURGERY — LAPAROSCOPIC CHOLECYSTECTOMY WITH INTRAOPERATIVE CHOLANGIOGRAM
Anesthesia: General | Site: Abdomen | Wound class: Clean Contaminated

## 2013-02-18 MED ORDER — HEPARIN SODIUM (PORCINE) 5000 UNIT/ML IJ SOLN
5000.0000 [IU] | Freq: Three times a day (TID) | INTRAMUSCULAR | Status: DC
  Administered 2013-02-18 – 2013-02-19 (×3): 5000 [IU] via SUBCUTANEOUS
  Filled 2013-02-18 (×6): qty 1

## 2013-02-18 MED ORDER — HYDROMORPHONE HCL PF 1 MG/ML IJ SOLN: 0.2500 mg | INTRAMUSCULAR | Status: DC | PRN

## 2013-02-18 MED ORDER — BUPIVACAINE LIPOSOME 1.3 % IJ SUSP
INTRAMUSCULAR | Status: DC | PRN
  Administered 2013-02-18: 20 mL

## 2013-02-18 MED ORDER — DEXTROSE 5 % IV SOLN
3.0000 g | INTRAVENOUS | Status: AC
  Administered 2013-02-18: 3 g via INTRAVENOUS
  Filled 2013-02-18: qty 3000

## 2013-02-18 MED ORDER — NEOSTIGMINE METHYLSULFATE 1 MG/ML IJ SOLN
INTRAMUSCULAR | Status: DC | PRN
  Administered 2013-02-18: 4 mg via INTRAVENOUS

## 2013-02-18 MED ORDER — ESMOLOL HCL 10 MG/ML IV SOLN
INTRAVENOUS | Status: DC | PRN
  Administered 2013-02-18: 20 mg via INTRAVENOUS
  Administered 2013-02-18: 10 mg via INTRAVENOUS

## 2013-02-18 MED ORDER — CEFAZOLIN SODIUM-DEXTROSE 2-3 GM-% IV SOLR
INTRAVENOUS | Status: AC
  Filled 2013-02-18: qty 50

## 2013-02-18 MED ORDER — METOCLOPRAMIDE HCL 5 MG/ML IJ SOLN
INTRAMUSCULAR | Status: DC | PRN
  Administered 2013-02-18: 5 mg via INTRAVENOUS

## 2013-02-18 MED ORDER — BUPIVACAINE LIPOSOME 1.3 % IJ SUSP
20.0000 mL | Freq: Once | INTRAMUSCULAR | Status: DC
  Filled 2013-02-18: qty 20

## 2013-02-18 MED ORDER — LOSARTAN POTASSIUM 50 MG PO TABS
100.0000 mg | ORAL_TABLET | Freq: Every day | ORAL | Status: DC
  Administered 2013-02-18: 100 mg via ORAL
  Filled 2013-02-18 (×2): qty 2

## 2013-02-18 MED ORDER — HEPARIN SODIUM (PORCINE) 5000 UNIT/ML IJ SOLN
5000.0000 [IU] | Freq: Once | INTRAMUSCULAR | Status: AC
  Administered 2013-02-18: 5000 [IU] via SUBCUTANEOUS
  Filled 2013-02-18: qty 1

## 2013-02-18 MED ORDER — ONDANSETRON HCL 4 MG/2ML IJ SOLN: 4.0000 mg | Freq: Four times a day (QID) | INTRAMUSCULAR | Status: DC | PRN

## 2013-02-18 MED ORDER — OXYCODONE-ACETAMINOPHEN 5-325 MG PO TABS: 1.0000 | ORAL_TABLET | ORAL | Status: DC | PRN

## 2013-02-18 MED ORDER — ACETAMINOPHEN 325 MG PO TABS: 650.0000 mg | ORAL_TABLET | ORAL | Status: DC | PRN

## 2013-02-18 MED ORDER — LACTATED RINGERS IV SOLN: INTRAVENOUS | Status: DC

## 2013-02-18 MED ORDER — METOPROLOL SUCCINATE ER 50 MG PO TB24
50.0000 mg | ORAL_TABLET | Freq: Every morning | ORAL | Status: DC
  Filled 2013-02-18: qty 1

## 2013-02-18 MED ORDER — LOSARTAN POTASSIUM 50 MG PO TABS: 100.0000 mg | ORAL_TABLET | Freq: Every morning | ORAL | Status: DC

## 2013-02-18 MED ORDER — CHLORHEXIDINE GLUCONATE 4 % EX LIQD
1.0000 "application " | Freq: Once | CUTANEOUS | Status: DC
  Filled 2013-02-18: qty 15

## 2013-02-18 MED ORDER — DEXAMETHASONE SODIUM PHOSPHATE 10 MG/ML IJ SOLN
INTRAMUSCULAR | Status: DC | PRN
  Administered 2013-02-18: 10 mg via INTRAVENOUS

## 2013-02-18 MED ORDER — IOHEXOL 300 MG/ML  SOLN
INTRAMUSCULAR | Status: AC
  Filled 2013-02-18: qty 1

## 2013-02-18 MED ORDER — FENTANYL CITRATE 0.05 MG/ML IJ SOLN
INTRAMUSCULAR | Status: DC | PRN
  Administered 2013-02-18: 50 ug via INTRAVENOUS
  Administered 2013-02-18: 100 ug via INTRAVENOUS
  Administered 2013-02-18 (×5): 50 ug via INTRAVENOUS

## 2013-02-18 MED ORDER — LABETALOL HCL 5 MG/ML IV SOLN
INTRAVENOUS | Status: DC | PRN
  Administered 2013-02-18 (×2): 10 mg via INTRAVENOUS
  Administered 2013-02-18: 5 mg via INTRAVENOUS

## 2013-02-18 MED ORDER — ONDANSETRON HCL 4 MG PO TABS: 4.0000 mg | ORAL_TABLET | Freq: Four times a day (QID) | ORAL | Status: DC | PRN

## 2013-02-18 MED ORDER — CEFAZOLIN SODIUM 1-5 GM-% IV SOLN
INTRAVENOUS | Status: AC
  Filled 2013-02-18: qty 50

## 2013-02-18 MED ORDER — LACTATED RINGERS IR SOLN
Status: DC | PRN
  Administered 2013-02-18: 1000 mL

## 2013-02-18 MED ORDER — IOHEXOL 300 MG/ML  SOLN
INTRAMUSCULAR | Status: DC | PRN
  Administered 2013-02-18: 76 mL via INTRAVENOUS

## 2013-02-18 MED ORDER — HYDROMORPHONE HCL PF 1 MG/ML IJ SOLN
0.5000 mg | INTRAMUSCULAR | Status: DC | PRN
  Administered 2013-02-18: 0.5 mg via INTRAVENOUS
  Filled 2013-02-18: qty 1

## 2013-02-18 MED ORDER — LIDOCAINE HCL (CARDIAC) 20 MG/ML IV SOLN
INTRAVENOUS | Status: DC | PRN
  Administered 2013-02-18: 75 mg via INTRAVENOUS

## 2013-02-18 MED ORDER — SCOPOLAMINE 1 MG/3DAYS TD PT72
MEDICATED_PATCH | TRANSDERMAL | Status: AC
  Filled 2013-02-18: qty 1

## 2013-02-18 MED ORDER — PROMETHAZINE HCL 25 MG/ML IJ SOLN: 6.2500 mg | INTRAMUSCULAR | Status: DC | PRN

## 2013-02-18 MED ORDER — SCOPOLAMINE 1 MG/3DAYS TD PT72
MEDICATED_PATCH | TRANSDERMAL | Status: DC | PRN
  Administered 2013-02-18: 1 via TRANSDERMAL

## 2013-02-18 MED ORDER — PROPOFOL 10 MG/ML IV BOLUS
INTRAVENOUS | Status: DC | PRN
  Administered 2013-02-18: 200 mg via INTRAVENOUS
  Administered 2013-02-18 (×3): 25 mg via INTRAVENOUS

## 2013-02-18 MED ORDER — GLYCOPYRROLATE 0.2 MG/ML IJ SOLN
INTRAMUSCULAR | Status: DC | PRN
  Administered 2013-02-18: .6 mg via INTRAVENOUS
  Administered 2013-02-18: .2 mg via INTRAVENOUS

## 2013-02-18 MED ORDER — LACTATED RINGERS IV SOLN
INTRAVENOUS | Status: DC | PRN
  Administered 2013-02-18 (×3): via INTRAVENOUS

## 2013-02-18 MED ORDER — SUCCINYLCHOLINE CHLORIDE 20 MG/ML IJ SOLN
INTRAMUSCULAR | Status: DC | PRN
  Administered 2013-02-18: 120 mg via INTRAVENOUS

## 2013-02-18 MED ORDER — SODIUM CHLORIDE 0.9 % IR SOLN
Status: DC | PRN
  Administered 2013-02-18: 76 mL

## 2013-02-18 MED ORDER — ONDANSETRON HCL 4 MG/2ML IJ SOLN
INTRAMUSCULAR | Status: DC | PRN
  Administered 2013-02-18 (×2): 2 mg via INTRAVENOUS

## 2013-02-18 MED ORDER — CISATRACURIUM BESYLATE (PF) 10 MG/5ML IV SOLN
INTRAVENOUS | Status: DC | PRN
  Administered 2013-02-18: 8 mg via INTRAVENOUS
  Administered 2013-02-18 (×2): 2 mg via INTRAVENOUS

## 2013-02-18 MED ORDER — KCL IN DEXTROSE-NACL 20-5-0.45 MEQ/L-%-% IV SOLN
INTRAVENOUS | Status: DC
  Administered 2013-02-18: 14:00:00 via INTRAVENOUS
  Administered 2013-02-19: 75 mL via INTRAVENOUS
  Filled 2013-02-18 (×3): qty 1000

## 2013-02-18 MED ORDER — MIDAZOLAM HCL 5 MG/5ML IJ SOLN
INTRAMUSCULAR | Status: DC | PRN
  Administered 2013-02-18 (×2): 1 mg via INTRAVENOUS

## 2013-02-18 SURGICAL SUPPLY — 45 items
APPLIER CLIP 5 13 M/L LIGAMAX5 (MISCELLANEOUS) ×2
APPLIER CLIP ROT 10 11.4 M/L (STAPLE)
BENZOIN TINCTURE PRP APPL 2/3 (GAUZE/BANDAGES/DRESSINGS) ×2 IMPLANT
CABLE HIGH FREQUENCY MONO STRZ (ELECTRODE) IMPLANT
CANISTER SUCTION 2500CC (MISCELLANEOUS) ×2 IMPLANT
CATH REDDICK CHOLANGI 4FR 50CM (CATHETERS) ×2 IMPLANT
CLIP APPLIE 5 13 M/L LIGAMAX5 (MISCELLANEOUS) ×1 IMPLANT
CLIP APPLIE ROT 10 11.4 M/L (STAPLE) IMPLANT
CLOTH BEACON ORANGE TIMEOUT ST (SAFETY) ×2 IMPLANT
COVER MAYO STAND STRL (DRAPES) ×2 IMPLANT
COVER SURGICAL LIGHT HANDLE (MISCELLANEOUS) ×2 IMPLANT
DECANTER SPIKE VIAL GLASS SM (MISCELLANEOUS) IMPLANT
DERMABOND ADVANCED (GAUZE/BANDAGES/DRESSINGS) ×1
DERMABOND ADVANCED .7 DNX12 (GAUZE/BANDAGES/DRESSINGS) ×1 IMPLANT
DRAPE C-ARM 42X120 X-RAY (DRAPES) ×2 IMPLANT
DRAPE LAPAROSCOPIC ABDOMINAL (DRAPES) ×2 IMPLANT
ELECT REM PT RETURN 9FT ADLT (ELECTROSURGICAL) ×2
ELECTRODE REM PT RTRN 9FT ADLT (ELECTROSURGICAL) ×1 IMPLANT
GLOVE BIO SURGEON STRL SZ7 (GLOVE) ×2 IMPLANT
GLOVE BIOGEL M 8.0 STRL (GLOVE) ×2 IMPLANT
GLOVE BIOGEL PI IND STRL 7.5 (GLOVE) ×3 IMPLANT
GLOVE BIOGEL PI INDICATOR 7.5 (GLOVE) ×3
GOWN STRL NON-REIN LRG LVL3 (GOWN DISPOSABLE) ×8 IMPLANT
GOWN STRL REIN XL XLG (GOWN DISPOSABLE) ×2 IMPLANT
HEMOSTAT SURGICEL 4X8 (HEMOSTASIS) IMPLANT
IV CATH 14GX2 1/4 (CATHETERS) ×2 IMPLANT
KIT BASIN OR (CUSTOM PROCEDURE TRAY) ×2 IMPLANT
NS IRRIG 1000ML POUR BTL (IV SOLUTION) ×2 IMPLANT
POUCH SPECIMEN RETRIEVAL 10MM (ENDOMECHANICALS) ×2 IMPLANT
SCISSORS LAP 5X35 DISP (ENDOMECHANICALS) ×2 IMPLANT
SET IRRIG TUBING LAPAROSCOPIC (IRRIGATION / IRRIGATOR) ×2 IMPLANT
SLEEVE Z-THREAD 5X100MM (TROCAR) IMPLANT
SOLUTION ANTI FOG 6CC (MISCELLANEOUS) ×2 IMPLANT
STRIP CLOSURE SKIN 1/2X4 (GAUZE/BANDAGES/DRESSINGS) ×2 IMPLANT
SUT VIC AB 4-0 SH 18 (SUTURE) ×2 IMPLANT
SUT VICRYL 0 UR6 27IN ABS (SUTURE) ×2 IMPLANT
SYR 30ML LL (SYRINGE) ×2 IMPLANT
TOWEL OR 17X26 10 PK STRL BLUE (TOWEL DISPOSABLE) ×4 IMPLANT
TRAY LAP CHOLE (CUSTOM PROCEDURE TRAY) ×2 IMPLANT
TROCAR BLADELESS OPT 5 75 (ENDOMECHANICALS) ×4 IMPLANT
TROCAR XCEL BLUNT TIP 100MML (ENDOMECHANICALS) ×2 IMPLANT
TROCAR XCEL NON-BLD 11X100MML (ENDOMECHANICALS) IMPLANT
TROCAR Z-THREAD FIOS 11X100 BL (TROCAR) IMPLANT
TROCAR Z-THREAD FIOS 5X100MM (TROCAR) ×6 IMPLANT
TUBING INSUFFLATION 10FT LAP (TUBING) ×2 IMPLANT

## 2013-02-18 NOTE — Preoperative (Signed)
Beta Blockers   Reason not to administer Beta Blockers:Not Applicablept took beta blocker this a.m. 

## 2013-02-18 NOTE — Anesthesia Procedure Notes (Signed)
Procedure Name: Intubation Date/Time: 02/18/2013 7:39 AM Performed by: Edison Pace Pre-anesthesia Checklist: Patient identified, Timeout performed, Emergency Drugs available, Suction available and Patient being monitored Patient Re-evaluated:Patient Re-evaluated prior to inductionOxygen Delivery Method: Circle system utilized Preoxygenation: Pre-oxygenation with 100% oxygen Intubation Type: IV induction and Cricoid Pressure applied Ventilation: Mask ventilation without difficulty Laryngoscope Size: Mac and 4 Grade View: Grade II Tube type: Oral Tube size: 7.5 mm Number of attempts: 1 Airway Equipment and Method: Stylet Placement Confirmation: ETT inserted through vocal cords under direct vision,  positive ETCO2 and breath sounds checked- equal and bilateral Secured at: 21 cm Tube secured with: Tape Dental Injury: Teeth and Oropharynx as per pre-operative assessment

## 2013-02-18 NOTE — Interval H&P Note (Signed)
History and Physical Interval Note:  02/18/2013 7:17 AM  Samuel Chambers  has presented today for surgery, with the diagnosis of gallstones  The various methods of treatment have been discussed with the patient and family. After consideration of risks, benefits and other options for treatment, the patient has consented to  Procedure(s): LAPAROSCOPIC CHOLECYSTECTOMY WITH INTRAOPERATIVE CHOLANGIOGRAM (N/A) as a surgical intervention .  The patient's history has been reviewed, patient examined, no change in status, stable for surgery.  I have reviewed the patient's chart and labs.  Questions were answered to the patient's satisfaction.     Erian Rosengren B

## 2013-02-18 NOTE — Transfer of Care (Signed)
Immediate Anesthesia Transfer of Care Note  Patient: Samuel Chambers  Procedure(s) Performed: Procedure(s): LAPAROSCOPIC CHOLECYSTECTOMY WITH INTRAOPERATIVE CHOLANGIOGRAM (N/A)  Patient Location: PACU  Anesthesia Type:General  Level of Consciousness: awake, alert , oriented and patient cooperative  Airway & Oxygen Therapy: Patient Spontanous Breathing and Patient connected to face mask oxygen  Post-op Assessment: Report given to PACU RN, Post -op Vital signs reviewed and stable and Patient moving all extremities  Post vital signs: Reviewed and stable  Complications: No apparent anesthesia complications

## 2013-02-18 NOTE — H&P (View-Only) (Signed)
Chief Complaint:  Recurrent attacks of midepigastric pain radiating into the back with gallstones on ultrasound  History of Present Illness:  Samuel Chambers is an 62 y.o. male comes in today with his wife with a history of epigastric pain attacks. The first of these occurred over Memorial Day weekend and was a classic attack awakening him in the melanotic and then lasting for about 4 hours. He had a subsequent recurrence on the way to work causing him to drive back to [place] and this again was fairly classic in the sense of the pain was the same as before. His very first ultrasound exam showed the 0.9 mm stone in the neck of the gallbladder but that was not seen on subsequent exam. I believe his symptoms and history that worked pretty classic for gallbladder attacks.  I discussed the procedure with him in detail and gave him a booklet on the procedure. We discussed common bile duct injuries bile leaks bleeding bowel injuries and he is aware of the risk and benefits of the procedure. We will go and proceed to schedule this at [facility] his convenience.  Past Medical History  Diagnosis Date  . Diverticulosis   . Internal hemorrhoids   . Adenomatous colon polyp 03/2005  . Hypertension   . Hyperlipidemia   . Anxiety   . GERD (gastroesophageal reflux disease)   . Degenerative joint disease   . CAD (coronary artery disease)   . PONV (postoperative nausea and vomiting)   . Neuromuscular disorder     Past Surgical History  Procedure Laterality Date  . Hip surgery      complete  . Heel spur surgery    . Hemorrhoid surgery    . Knee arthroscopy  03/27/2012    Procedure: ARTHROSCOPY KNEE;  Surgeon: Loanne Drilling, MD;  Location: [facility];  Service: Orthopedics;  Laterality: Left;  . Coronary angioplasty with stent placement      2007  . Total knee arthroplasty Left 09/04/2012    Procedure: TOTAL KNEE ARTHROPLASTY;  Surgeon: Loanne Drilling, MD;  Location: [facility];  Service: Orthopedics;   Laterality: Left;    Current Outpatient Prescriptions  Medication Sig Dispense Refill  . acetaminophen (TYLENOL) 500 MG tablet Take 1,000 mg by mouth every 6 (six) hours as needed. Pain      . aspirin EC 81 MG tablet Take 81 mg by mouth daily.      . clopidogrel (PLAVIX) 75 MG tablet Take 75 mg by mouth daily.      . DULoxetine (CYMBALTA) 60 MG capsule Take 60 mg by mouth every morning.       Marland Kitchen esomeprazole (NEXIUM) 20 MG capsule Take 20 mg by mouth daily before breakfast.      . hyoscyamine (LEVSIN SL) 0.125 MG SL tablet       . losartan (COZAAR) 100 MG tablet Take 100 mg by mouth every morning.      . metoprolol succinate (TOPROL-XL) 50 MG 24 hr tablet Take 50 mg by mouth every morning. Take with or immediately following a meal.      . Omega-3 Fatty Acids (FISH OIL PO) Take 1 tablet by mouth daily.      . furosemide (LASIX) 20 MG tablet        No current facility-administered medications for this visit.   Oxycodone and Warfarin and related Family History  Problem Relation Age of Onset  . Breast cancer Mother   . Diabetes Mother   . Prostate  cancer Father    Social History:   reports that he has never smoked. He has never used smokeless tobacco. He reports that he does not drink alcohol or use illicit drugs.   REVIEW OF SYSTEMS - PERTINENT POSITIVES ONLY: No history of DVT. The patient has had a left knee as well as total hips. He does take Plavix for a single stent.  Physical Exam:   Blood pressure 130/90, pulse 100, temperature 97.8 F (36.6 C), temperature source Temporal, resp. rate 14, height 5\' 11"  (1.803 m), weight 269 lb 9.6 oz (122.29 kg). Body mass index is 37.62 kg/(m^2).  Gen:  WDWN white male NAD  Neurological: Alert and oriented to person, place, and time. Motor and sensory function is grossly intact  Head: Normocephalic and atraumatic.  Eyes: Conjunctivae are normal. Pupils are equal, round, and reactive to light. No scleral icterus.  Neck: Normal range of  motion. Neck supple. No tracheal deviation or thyromegaly present.  Cardiovascular:  SR without murmurs or gallops.  No carotid bruits Respiratory: Effort normal.  No respiratory distress. No chest wall tenderness. Breath sounds normal.  No wheezes, rales or rhonchi.  Abdomen:  nontender and mildly obese GU: Musculoskeletal: Normal range of motion. Extremities are nontender. No cyanosis, edema or clubbing noted Lymphadenopathy: No cervical, preauricular, postauricular or axillary adenopathy is present Skin: Skin is warm and dry. No rash noted. No diaphoresis. No erythema. No pallor. Pscyh: Normal mood and affect. Behavior is normal. Judgment and thought content normal.   LABORATORY RESULTS: No results found for this or any previous visit (from the past 48 hour(s)).  RADIOLOGY RESULTS: No results found.  Problem List: Patient Active Problem List   Diagnosis Date Noted  . Gallstones 01/28/2013  . OA (osteoarthritis) of knee 09/04/2012  . Acute medial meniscal tear 03/27/2012  . ANXIETY 02/24/2007  . HYPERTENSION 02/24/2007  . GERD 02/24/2007  . DEGENERATIVE JOINT DISEASE, GENERALIZED 02/24/2007  . CHEST PAIN 02/24/2007    Assessment & Plan: Symptomatic gallstone with recurrent attacks. Plan laparoscopic cholecystectomy with intraoperative cholangiogram and [facility] B. Daphine Deutscher, MD, [facility] [phone] beeper [phone]  01/28/2013 12:22 PM

## 2013-02-18 NOTE — Op Note (Signed)
Surgeon: Wenda Low, MD, FACS  Asst:  Marcille Blanco, MD, FACS  Anes:  general  Procedure: Laparoscopic cholecystectomy with IOC  Diagnosis: Chronic cholecystitis  Complications: none  EBL:   10 cc  Description of Procedure:  The patient was taken to room #6 and given general anesthesia. The abdomen was prepped with PCMX and draped sterilely. A timeout was performed. A vertical incision was made down into the umbilicus and the abdomen was entered without difficulty the base of the umbilicus. Hassan cannula was inserted. The abdomen was insufflated and the anatomy visualized. 35 mm trochars were placed in the upper abdomen.  The gallbladder was grasped and was stuck to the adjacent omentum which walled off. This indicated significant previous attacks of cholecystitis. This was stripped away and the infundibulum was found to be encased in this omental adhesion as well. Kalos triangle was stuck in this was a bit of a tedious dissection but are identified the final of the gallbladder and the creation of the cystic duct. A small opening was made in the spinal and I went ahead and used that for a Reddick catheter placement and cholangiogram. This demonstrated a fairly long tortuous cystic duct it appeared to empty more distally in the common bile duct. The cystic duct was then triple clipped divided cystic artery was clipped and each of its branches and bleeding was a controlled. The gallbladder was removed from the gallbladder bed without difficulty with the hook electrocautery. It was used in a bag and brought to the umbilicus without difficulty.  Inspected the gallbladder bed again and again no bleeding or bile leaks were noted. The port sites were injected with Exparel. The umbilical port was closed using 2-0 Vicryl simple sutures. This was done with laparoscopic visualization of the structure. We identified some pericumbilical fat that had been inflated and it appeared to be in order.  Skin closure  was with 4-0 Vicryl and Dermabond. The patient was taken to the recovery room in satisfactory condition  Matt B. Daphine Deutscher, MD, [facility], [place] [phone]

## 2013-02-18 NOTE — Anesthesia Preprocedure Evaluation (Signed)
Anesthesia Evaluation  Patient identified by MRN, date of birth, ID band Patient awake    Reviewed: Allergy & Precautions, H&P , NPO status , Patient's Chart, lab work & pertinent test results  History of Anesthesia Complications (+) PONV  Airway Mallampati: II TM Distance: >3 FB Neck ROM: Full    Dental no notable dental hx. (+) Teeth Intact and Dental Advisory Given   Pulmonary neg pulmonary ROS,  breath sounds clear to auscultation  Pulmonary exam normal       Cardiovascular Exercise Tolerance: Good hypertension, + CAD and + Cardiac Stents Rhythm:Regular Rate:Normal     Neuro/Psych Anxiety negative neurological ROS     GI/Hepatic Neg liver ROS, GERD-  ,  Endo/Other  Morbid obesity  Renal/GU negative Renal ROS  negative genitourinary   Musculoskeletal negative musculoskeletal ROS (+)   Abdominal (+) + obese,   Peds  Hematology negative hematology ROS (+)   Anesthesia Other Findings   Reproductive/Obstetrics                           Anesthesia Physical Anesthesia Plan  ASA: III  Anesthesia Plan: General   Post-op Pain Management:    Induction: Intravenous  Airway Management Planned: Oral ETT  Additional Equipment:   Intra-op Plan:   Post-operative Plan: Extubation in OR  Informed Consent: I have reviewed the patients History and Physical, chart, labs and discussed the procedure including the risks, benefits and alternatives for the proposed anesthesia with the patient or authorized representative who has indicated his/her understanding and acceptance.   Dental advisory given  Plan Discussed with: CRNA  Anesthesia Plan Comments:         Anesthesia Quick Evaluation

## 2013-02-18 NOTE — Anesthesia Postprocedure Evaluation (Signed)
Anesthesia Post Note  Patient: Samuel Chambers  Procedure(s) Performed: Procedure(s) (LRB): LAPAROSCOPIC CHOLECYSTECTOMY WITH INTRAOPERATIVE CHOLANGIOGRAM (N/A)  Anesthesia type: General  Patient location: PACU  Post pain: Pain level controlled  Post assessment: Post-op Vital signs reviewed  Last Vitals:  Filed Vitals:   02/18/13 1138  BP: 154/77  Pulse: 78  Temp: 36.7 C  Resp: 16    Post vital signs: Reviewed  Level of consciousness: sedated  Complications: No apparent anesthesia complications

## 2013-02-19 ENCOUNTER — Encounter (INDEPENDENT_AMBULATORY_CARE_PROVIDER_SITE_OTHER): Payer: Self-pay | Admitting: Internal Medicine

## 2013-02-19 ENCOUNTER — Encounter (HOSPITAL_COMMUNITY): Payer: Self-pay | Admitting: Surgery

## 2013-02-19 LAB — COMPREHENSIVE METABOLIC PANEL
Albumin: 3.3 g/dL — ABNORMAL LOW (ref 3.5–5.2)
Alkaline Phosphatase: 56 U/L (ref 39–117)
BUN: 9 mg/dL (ref 6–23)
CO2: 27 mEq/L (ref 19–32)
Calcium: 9.3 mg/dL (ref 8.4–10.5)
Chloride: 102 mEq/L (ref 96–112)
Creatinine, Ser: 0.87 mg/dL (ref 0.50–1.35)
GFR calc non Af Amer: >90 mL/min (ref >90)
Glucose, Bld: 165 mg/dL — ABNORMAL HIGH (ref 70–99)
Total Bilirubin: 0.3 mg/dL (ref 0.3–1.2)

## 2013-02-19 LAB — CBC
Hemoglobin: 11.9 g/dL — ABNORMAL LOW (ref 13.0–17.0)
MCV: 82.1 fL (ref 78.0–100.0)
Platelets: 193 10*3/uL (ref 150–400)
RBC: 4.3 MIL/uL (ref 4.22–5.81)
WBC: 11.9 10*3/uL — ABNORMAL HIGH (ref 4.0–10.5)

## 2013-02-19 MED ORDER — HYDROCODONE-ACETAMINOPHEN 5-325 MG PO TABS: 1.0000 | ORAL_TABLET | Freq: Four times a day (QID) | ORAL | Status: DC | PRN

## 2013-02-19 NOTE — Discharge Summary (Signed)
  Physician Discharge Summary  Patient ID: SABRINA ARRIAGA MRN: [id number] DOB/AGE: 12-28-1950 62 y.o.  Admit date: 02/18/2013 Discharge date: 02/19/2013  Admitting Diagnosis: Chronic cholecystitis  Discharge Diagnosis Chronic cholecystitis  Consultants None  Procedures Laparoscopic Cholecystectomy with [facility]  Hospital Course 62 yr old male who was admitted to [facility] Chronic cholecystitis.  Underwent procedure listed above.  Tolerated procedure well and was transferred to the floor.  Diet was advanced as tolerated.  On POD#1, the patient was voiding well, tolerating diet, ambulating well, pain well controlled, vital signs stable, incisions c/d/i and felt stable for discharge home.  Patient will follow up in our office in 2 weeks and knows to call with questions or concerns.  Physical Exam: VSS afebrile General: NAD Abd: soft, mildly tender over incisions, c/d/i, +BS    Medication List         acetaminophen 500 MG tablet  Commonly known as:  TYLENOL  Take 1,000 mg by mouth every 6 (six) hours as needed. Pain     aspirin EC 81 MG tablet  Take 81 mg by mouth daily.     clopidogrel 75 MG tablet  Commonly known as:  PLAVIX  Take 75 mg by mouth daily.     DULoxetine 60 MG capsule  Commonly known as:  CYMBALTA  Take 60 mg by mouth every morning.     esomeprazole 20 MG capsule  Commonly known as:  NEXIUM  Take 20 mg by mouth daily before breakfast.     fish oil-omega-3 fatty acids 1000 MG capsule  Take 1 g by mouth daily.     HYDROcodone-acetaminophen 5-325 MG per tablet  Commonly known as:  NORCO  Take 1 tablet by mouth every 6 (six) hours as needed for pain.     losartan 100 MG tablet  Commonly known as:  COZAAR  Take 100 mg by mouth every morning.     metoprolol succinate 50 MG 24 hr tablet  Commonly known as:  TOPROL-XL  Take 50 mg by mouth every morning. Take with or immediately following a meal.             Follow-up Information   Follow up with  Luretha Murphy B, MD In 2 weeks. (Our office will contact you with your appt day and time)    Contact information:   [address] [place] [address] [phone]       Signed: Denny Levy [facility] [phone]  02/19/2013, 9:09 AM

## 2013-02-19 NOTE — Discharge Instructions (Signed)
[facility] ______[facility] °LAPAROSCOPIC SURGERY: POST OP INSTRUCTIONS °Always review your discharge instruction sheet given to you by the facility where your surgery was performed. °IF YOU HAVE DISABILITY OR FAMILY LEAVE FORMS, YOU MUST BRING THEM TO THE OFFICE FOR PROCESSING.   °DO NOT GIVE THEM TO YOUR DOCTOR. ° °1. A prescription for pain medication may be given to you upon discharge.  Take your pain medication as prescribed, if needed.  If narcotic pain medicine is not needed, then you may take acetaminophen (Tylenol) or ibuprofen (Advil) as needed. °2. Take your usually prescribed medications unless otherwise directed. °3. If you need a refill on your pain medication, please contact your pharmacy.  They will contact our office to request authorization. Prescriptions will not be filled after 5pm or on week-ends. °4. You should follow a light diet the first few days after arrival home, such as soup and crackers, etc.  Be sure to include lots of fluids daily. °5. Most patients will experience some swelling and bruising in the area of the incisions.  Ice packs will help.  Swelling and bruising can take several days to resolve.  °6. It is common to experience some constipation if taking pain medication after surgery.  Increasing fluid intake and taking a stool softener (such as Colace) will usually help or prevent this problem from occurring.  A mild laxative (Milk of Magnesia or Miralax) should be taken according to package instructions if there are no bowel movements after 48 hours. °7. Unless discharge instructions indicate otherwise, you may remove your bandages 24-48 hours after surgery, and you may shower at that time.  You may have steri-strips (small skin tapes) in place directly over the incision.  These strips should be left on the skin for 7-10 days.  If your surgeon used skin glue on the incision, you may shower in 24 hours.  The glue will flake off over the next 2-3 weeks.  Any sutures or  staples will be removed at the office during your follow-up visit. °8. ACTIVITIES:  You may resume regular (light) daily activities beginning the next day--such as daily self-care, walking, climbing stairs--gradually increasing activities as tolerated.  You may have sexual intercourse when it is comfortable.  Refrain from any heavy lifting or straining until approved by your doctor. °a. You may drive when you are no longer taking prescription pain medication, you can comfortably wear a seatbelt, and you can safely maneuver your car and apply brakes. °b. RETURN TO WORK:  __________________________________________________________ °9. You should see your doctor in the office for a follow-up appointment approximately 2-3 weeks after your surgery.  Make sure that you call for this appointment within a day or two after you arrive home to insure a convenient appointment time. °10. OTHER INSTRUCTIONS: __________________________________________________________________________________________________________________________ __________________________________________________________________________________________________________________________ °WHEN TO CALL YOUR DOCTOR: °1. Fever over 101.0 °2. Inability to urinate °3. Continued bleeding from incision. °4. Increased pain, redness, or drainage from the incision. °5. Increasing abdominal pain ° °The clinic staff is available to answer your questions during regular business hours.  Please don’t hesitate to call and ask to speak to one of the nurses for clinical concerns.  If you have a medical emergency, go to the nearest emergency room or call 911.  A surgeon from [facility] is always on call at the hospital. °[address], [address], [place], [place]  [address] ? [address] [address], [place], [place]   [address] °[phone] ? [phone] ? FAX [phone] °Web site:   [email or web address] °

## 2013-02-22 ENCOUNTER — Other Ambulatory Visit: Payer: Self-pay

## 2013-03-05 ENCOUNTER — Other Ambulatory Visit: Payer: Self-pay | Admitting: *Deleted

## 2013-03-05 MED ORDER — METOPROLOL SUCCINATE ER 50 MG PO TB24: 50.0000 mg | ORAL_TABLET | Freq: Every morning | ORAL | Status: DC

## 2013-03-05 NOTE — Telephone Encounter (Signed)
Rx was sent to pharmacy electronically. 

## 2013-03-12 ENCOUNTER — Ambulatory Visit (INDEPENDENT_AMBULATORY_CARE_PROVIDER_SITE_OTHER): Payer: Managed Care, Other (non HMO) | Admitting: Surgery

## 2013-03-12 ENCOUNTER — Encounter (INDEPENDENT_AMBULATORY_CARE_PROVIDER_SITE_OTHER): Payer: Self-pay | Admitting: Surgery

## 2013-03-12 VITALS — BP 160/100 | HR 88 | Resp 16 | Ht 71.5 in | Wt 270.0 lb

## 2013-03-12 DIAGNOSIS — Z9049 Acquired absence of other specified parts of digestive tract: Secondary | ICD-10-CM

## 2013-03-12 DIAGNOSIS — Z9889 Other specified postprocedural states: Secondary | ICD-10-CM

## 2013-03-12 NOTE — Patient Instructions (Signed)
Diagnosis Gallbladder - CHRONIC CHOLECYSTITIS AND CHOLELITHIASIS. Samuel Miyamoto MD Pathologist, Electronic Signature (Case signed 02/19/2013) Specimen Gross and Clinical Information Specimen(s) Obtained: Gallbladder Specimen Clinical Information gallstones (kp) Gross Size/?Intact: 8.2 x 3 x 1.8 cm, intact. Serosal surface: Yellow pink to hyperemic with few scattered adhesions. Mucosa/Wall: The mucosa is tan red, smooth soft with faint yellow streaking. The wall is up to 0.4 cm thick. Contents: Within the proximal cystic duct is a 0.5 cm green brown roughened cholelith. Cystic duct: Contains a stone. Block Summary: One block. (SW:caf 02/18/13) Report signed out from the following location(s) Technical Component and Interpretation performed at [facility] [address], [place], [place] [address]. CLIA #: [id number], 1 of

## 2013-03-12 NOTE — Progress Notes (Signed)
Samuel Chambers 62 y.o.  Body mass index is 37.14 kg/(m^2).  Patient Active Problem List   Diagnosis Date Noted  . S/P laparoscopic cholecystectomy July 2014 02/18/2013  . Gallstones 01/28/2013  . OA (osteoarthritis) of knee 09/04/2012  . Acute medial meniscal tear 03/27/2012  . ANXIETY 02/24/2007  . HYPERTENSION 02/24/2007  . GERD 02/24/2007  . DEGENERATIVE JOINT DISEASE, GENERALIZED 02/24/2007  . CHEST PAIN 02/24/2007    Allergies  Allergen Reactions  . Oxycodone Other (See Comments)    hallucinations  . Warfarin And Related Other (See Comments)    Fast heart beat, went down hill, felt like he was dying    Past Surgical History  Procedure Laterality Date  . Hip surgery      complete  . Heel spur surgery    . Hemorrhoid surgery    . Knee arthroscopy  03/27/2012    Procedure: ARTHROSCOPY KNEE;  Surgeon: Loanne Drilling, MD;  Location: [facility];  Service: Orthopedics;  Laterality: Left;  . Coronary angioplasty with stent placement      2007  . Total knee arthroplasty Left 09/04/2012    Procedure: TOTAL KNEE ARTHROPLASTY;  Surgeon: Loanne Drilling, MD;  Location: [facility];  Service: Orthopedics;  Laterality: Left;  . Cholecystectomy N/A 02/18/2013    Procedure: LAPAROSCOPIC CHOLECYSTECTOMY WITH INTRAOPERATIVE CHOLANGIOGRAM;  Surgeon: Valarie Merino, MD;  Location: [facility];  Service: General;  Laterality: N/A;   Darnelle Bos, MD No diagnosis found.  Doing well.  Swelling went down in left leg after cholecystectomy.  He did have a lot of chronic inflammation.   Path included with AVS. Return Prn Matt B. Daphine Deutscher, MD, [facility] [phone] beeper [phone]  03/12/2013 4:26 PM

## 2013-04-28 ENCOUNTER — Encounter: Payer: Self-pay | Admitting: *Deleted

## 2013-04-28 ENCOUNTER — Encounter: Payer: Self-pay | Admitting: Cardiology

## 2013-04-28 ENCOUNTER — Ambulatory Visit (INDEPENDENT_AMBULATORY_CARE_PROVIDER_SITE_OTHER): Payer: Managed Care, Other (non HMO) | Admitting: Cardiology

## 2013-04-28 VITALS — BP 132/80 | HR 74 | Ht 71.5 in | Wt 271.8 lb

## 2013-04-28 DIAGNOSIS — I2510 Atherosclerotic heart disease of native coronary artery without angina pectoris: Secondary | ICD-10-CM

## 2013-04-28 DIAGNOSIS — R079 Chest pain, unspecified: Secondary | ICD-10-CM

## 2013-04-28 DIAGNOSIS — Z9861 Coronary angioplasty status: Secondary | ICD-10-CM

## 2013-04-28 DIAGNOSIS — E782 Mixed hyperlipidemia: Secondary | ICD-10-CM

## 2013-04-28 DIAGNOSIS — Z79899 Other long term (current) drug therapy: Secondary | ICD-10-CM

## 2013-04-28 MED ORDER — NITROGLYCERIN 0.4 MG SL SUBL: 0.4000 mg | SUBLINGUAL_TABLET | SUBLINGUAL | Status: DC | PRN

## 2013-04-28 MED ORDER — LOSARTAN POTASSIUM 50 MG PO TABS: 50.0000 mg | ORAL_TABLET | Freq: Every day | ORAL | Status: DC

## 2013-04-28 MED ORDER — METOPROLOL SUCCINATE ER 50 MG PO TB24: 50.0000 mg | ORAL_TABLET | Freq: Every morning | ORAL | Status: DC

## 2013-04-28 MED ORDER — CLOPIDOGREL BISULFATE 75 MG PO TABS: 75.0000 mg | ORAL_TABLET | Freq: Every day | ORAL | Status: DC

## 2013-04-28 NOTE — Patient Instructions (Signed)
CONTINUE WITH LOSARTAN 50 MG  BY MOUTH  DAILY TAKE FUROSEMIDE AS NEEDED   Labs cmp ,lipid  Your physician wants you to follow-up in 6 month  Dr Herbie Baltimore.  You will receive a reminder letter in the mail two months in advance. If you don't receive a letter, please call our office to schedule the follow-up appointment.

## 2013-04-29 ENCOUNTER — Encounter: Payer: Self-pay | Admitting: Cardiology

## 2013-04-29 DIAGNOSIS — I2510 Atherosclerotic heart disease of native coronary artery without angina pectoris: Secondary | ICD-10-CM | POA: Insufficient documentation

## 2013-04-29 DIAGNOSIS — E785 Hyperlipidemia, unspecified: Secondary | ICD-10-CM | POA: Insufficient documentation

## 2013-04-29 DIAGNOSIS — Z79899 Other long term (current) drug therapy: Secondary | ICD-10-CM | POA: Insufficient documentation

## 2013-04-29 NOTE — Progress Notes (Signed)
PATIENT: Samuel Chambers MRN: [id number]  DOB: 1950-12-10   DOV:04/29/2013 PCP: Samuel Bos, MD  Clinic Note: Chief Complaint  Patient presents with  . 6 MONTH VISIT    CHEST PAIN  OCCAS SOMETIME SOB,SOMETIME EDEMA, ATRHOSCOPIC Elly Modena, GALLBLADDER SURGERY    HPI: Samuel Chambers is a 62 y.o. male with a PMH below who presents today for an eight-month followup. He is a poor patient of Dr. Caprice Kluver with a history of single-vessel coronary disease from 2008 -- PCI to the circumflex with a Promus DES 2.5 mm x 8 mm.  I last saw him in January of this year as part of preoperative evaluation for knee surgery.  The other she stage II of knee surgery we had total knee replacement.  He also has  Had cholecystectomy this July.  He is finally getting back to work, but is not yet ready to exercise.  He still has a lot of pain in that knee.  Interval History: He presents today for followup as to be well for cardiac standpoint.  His only concern about waiting too long.  He does have a hard time getting it back off again.  He describes having one episode of some discomfort in his left arm that lasted a few minutes.  Otherwise really no true anginal chest pain.  Nothing like chest tightness or pressure.  He would like to get any exercise but is really limited by his knee.  He says he would very much like to get back to exercising second is the way back. With the current level activities doing, he denies any real chest pressure or tightness with rest or exertion.  Also no dyspnea with rest or exertion.  He denies any PND, orthopnea but occasionally does have some edema.  He says he has these strange spells of twitching his chest off and on and occasionally feels a little short of breath.  Usually these don't occur when he is exerting himself but just at rest.  He probably had episodes where he had some significant swelling in the left knee overnight have gone down intermittently. He has been on  furosemide that he is not a when necessary.  He also said that one of his doctors told him to stop Cozaar because of swelling, any specific pattern half from 100 reducing it to 50 mg daily.  The remainder of Cardiovascular ROS: positive for - chest pain negative for - dyspnea on exertion, irregular heartbeat, loss of consciousness, murmur, orthopnea, palpitations, paroxysmal nocturnal dyspnea, rapid heart rate or shortness of breath: Additional cardiac review of systems: Lightheadedness - no, dizziness - no, syncope/near-syncope - no; TIA/amaurosis fugax - no Melena - no, hematochezia no; hematuria - no; nosebleeds - no; claudication - no  Past Medical History  Diagnosis Date  . Diverticulosis   . Internal hemorrhoids   . Adenomatous colon polyp 03/2005  . Hypertension   . Anxiety   . GERD (gastroesophageal reflux disease)   . Degenerative joint disease   . CAD S/P percutaneous coronary angioplasty 2008    PCI to circumflex with a Promus DES 2.5 mm x 8 mm  . PONV (postoperative nausea and vomiting)   . Hyperlipidemia     statin intolerant  . Obesity, Class II, BMI 35-39.9   . Fibromyalgia    Prior Cardiac Evaluation and Past Surgical History: Past Surgical History  Procedure Laterality Date  . Hip surgery      complete  . Heel spur surgery    .  Hemorrhoid surgery    . Knee arthroscopy  03/27/2012    Procedure: ARTHROSCOPY KNEE;  Surgeon: Loanne Drilling, MD;  Location: [facility];  Service: Orthopedics;  Laterality: Left;  . Total knee arthroplasty Left 09/04/2012    Procedure: TOTAL KNEE ARTHROPLASTY;  Surgeon: Loanne Drilling, MD;  Location: [facility];  Service: Orthopedics;  Laterality: Left;  . Cholecystectomy N/A 02/18/2013    Procedure: LAPAROSCOPIC CHOLECYSTECTOMY WITH INTRAOPERATIVE CHOLANGIOGRAM;  Surgeon: Valarie Merino, MD;  Location: [facility];  Service: General;  Laterality: N/A;  . Doppler echocardiography  05/27/2002    [facility]normal EF 55-66%,  . Nm myocar perf wall  motion  12/19/2011    EXERCISED FOR 8-1/2 MINUTES RECHING 10 METABOLIC EQUIVALENTS. NO EVIDENCE  OF ISCHEMIA OR INFARCTION . EF 71%  . Renal dopplers  04/08/2007    relatively normal  . Coronary angioplasty with stent placement  04/30/2007    2.5 mm Promus stent that was to 95% Circ;had 60-70% lesion to RCA  . Holter monitor  04/07/2007    sinus tachy.;   Allergies  Allergen Reactions  . Codeine   . Crestor [Rosuvastatin] Other (See Comments)    myalgias  . Oxycodone Other (See Comments)    hallucinations  . Prednisone   . Warfarin And Related Other (See Comments)    Fast heart beat, went down hill, felt like he was dying    Current Outpatient Prescriptions  Medication Sig Dispense Refill  . acetaminophen (TYLENOL) 500 MG tablet Take 1,000 mg by mouth every 6 (six) hours as needed. Pain      . aspirin EC 81 MG tablet Take 81 mg by mouth daily.      . clopidogrel (PLAVIX) 75 MG tablet Take 1 tablet (75 mg total) by mouth daily.  90 tablet  3  . DULoxetine (CYMBALTA) 60 MG capsule Take 60 mg by mouth every morning.       Marland Kitchen esomeprazole (NEXIUM) 20 MG capsule Take 20 mg by mouth daily before breakfast.      . fish oil-omega-3 fatty acids 1000 MG capsule Take 1 g by mouth daily.      . furosemide (LASIX) 20 MG tablet as needed.       . metoprolol succinate (TOPROL-XL) 50 MG 24 hr tablet Take 1 tablet (50 mg total) by mouth every morning. Take with or immediately following a meal.  90 tablet  3  . nitroGLYCERIN (NITROSTAT) 0.4 MG SL tablet Place 1 tablet (0.4 mg total) under the tongue every 5 (five) minutes as needed for chest pain.  25 tablet  6  . Tadalafil (CIALIS PO) Take by mouth as needed.      . traMADol (ULTRAM) 50 MG tablet       . losartan (COZAAR) 50 MG tablet Take 1 tablet (50 mg total) by mouth daily.  90 tablet  3   No current facility-administered medications for this visit.    History   Social History Narrative   Very little 0-2 drinks a week    ROS: A  comprehensive Review of Systems - Negative except pertinient positives above & below. General ROS: positive for  - Inability to lose weight Musculoskeletal ROS: Knee and hip pain -- notably his left knee  PHYSICAL EXAM BP 132/80  Pulse 74  Ht 5' 11.5" (1.816 m)  Wt 271 lb 12.8 oz (123.288 kg)  BMI 37.38 kg/m2 General appearance: alert, cooperative, appears stated age, no distress and mildly obese Neck: no adenopathy, no  carotid bruit and no JVD Lungs: clear to auscultation bilaterally, normal percussion bilaterally and non-labored Heart: regular rate and rhythm, S1, S2 normal, no murmur, click, rub or gallop; nondisplaced PMI Abdomen: soft, non-tender; bowel sounds normal; no masses,  no organomegaly; ho HJR Extremities: extremities normal, atraumatic, no cyanosis, and edema; the L knee is somewhat swollen and tender Pulses: 2+ and symmetric; Neurologic: Mental status: Alert, oriented, thought content appropriate Cranial nerves: normal (II-XII grossly intact)  [id number] today: Yes Rate: 74 , Rhythm: NSR, inferior MI, age undetermined;    Recent Labs: None recent  ASSESSMENT / PLAN: CAD S/P percutaneous coronary angioplasty Deciliter relatively well from a cardiac standpoint.  I think some of his symptoms are musculoskeletal in nature.  The chest discomfort he describes is much more along the lines of being a muscle or something.  It is not associated with exertion, and not persistent.  Plan: Continue aspirin plus beta blocker and ARB.  He is also on Plavix, which he will continue 60s still having some symptoms.  This could be stopped for any procedures.  CHEST PAIN I really not overwhelmed by the nature of his symptoms.  He said that his get back into exercising and doing the same routine he used to do.  I think he is very deconditioned, and gets muscle soreness that he did not used to get.  Mixed hyperlipidemia With CAD and dyslipidemia, he is on fish oil, but I do not see  these on a statin.  He has Crestor listed as allergies/intolerance, which would mean that he probably ischemic intolerant of any of the older generation statins.  We could potentially try Pravachol , otherwise we have Livalo as the only remaining statin.  Otherwise we did choose fenofibrate and/or Zetia.  Plan: Check lipid panel and chemistry panel; make adjustments to medication regimen based on these results.  Encounter for long-term (current) use of other medications We will plan to check LFTs along with lipids in order to repair for future checks while on any medication for his lipids.  This will be our baseline    Orders Placed This Encounter  Procedures  . Comprehensive metabolic panel    Order Specific Question:  Has the patient fasted?    Answer:  Yes  . Lipid panel    Order Specific Question:  Has the patient fasted?    Answer:  Yes  . EKG 12-Lead    Scheduling Instructions:     [facility] [place] [facility] [place]    Order Specific Question:  Where should this test be performed    Answer:  OTHER   Followup: 6 months  Terrian Ridlon W. Herbie Baltimore, M.D., M.S. [facility] [address] [place], [place]  [address]  [phone] Pager # [phone]

## 2013-04-29 NOTE — Assessment & Plan Note (Signed)
With CAD and dyslipidemia, he is on fish oil, but I do not see these on a statin.  He has Crestor listed as allergies/intolerance, which would mean that he probably ischemic intolerant of any of the older generation statins.  We could potentially try Pravachol , otherwise we have Livalo as the only remaining statin.  Otherwise we did choose fenofibrate and/or Zetia.  Plan: Check lipid panel and chemistry panel; make adjustments to medication regimen based on these results.

## 2013-04-29 NOTE — Assessment & Plan Note (Signed)
We will plan to check LFTs along with lipids in order to repair for future checks while on any medication for his lipids.  This will be our baseline

## 2013-04-29 NOTE — Assessment & Plan Note (Signed)
I really not overwhelmed by the nature of his symptoms.  He said that his get back into exercising and doing the same routine he used to do.  I think he is very deconditioned, and gets muscle soreness that he did not used to get.

## 2013-04-29 NOTE — Assessment & Plan Note (Signed)
Deciliter relatively well from a cardiac standpoint.  I think some of his symptoms are musculoskeletal in nature.  The chest discomfort he describes is much more along the lines of being a muscle or something.  It is not associated with exertion, and not persistent.  Plan: Continue aspirin plus beta blocker and ARB.  He is also on Plavix, which he will continue 60s still having some symptoms.  This could be stopped for any procedures.

## 2013-05-17 ENCOUNTER — Encounter: Payer: Self-pay | Admitting: Cardiology

## 2013-06-01 ENCOUNTER — Telehealth: Payer: Self-pay | Admitting: Cardiology

## 2013-06-01 LAB — COMPREHENSIVE METABOLIC PANEL
ALT: 39 U/L (ref 0–53)
Albumin: 4.6 g/dL (ref 3.5–5.2)
CO2: 22 mEq/L (ref 19–32)
Calcium: 9.8 mg/dL (ref 8.4–10.5)
Chloride: 101 mEq/L (ref 96–112)
Glucose, Bld: 91 mg/dL (ref 70–99)
Sodium: 139 mEq/L (ref 135–145)
Total Protein: 6.9 g/dL (ref 6.0–8.3)

## 2013-06-01 LAB — LIPID PANEL: Cholesterol: 169 mg/dL (ref 0–200)

## 2013-06-01 NOTE — Telephone Encounter (Signed)
Pt lost his lab order.Would you please fax one down to [facility],he wants to come in this afternon.

## 2013-06-01 NOTE — Telephone Encounter (Signed)
Returned call and pt verified x 2.  Pt informed message received and orders in computer.  Just show up for lab draw fasting.  Pt verbalized understanding and agreed w/ plan.  Stated he will be fasting all day.

## 2013-06-03 ENCOUNTER — Telehealth: Payer: Self-pay | Admitting: *Deleted

## 2013-06-03 NOTE — Telephone Encounter (Signed)
Spoke to patient. Result given . Verbalized understanding  

## 2013-06-03 NOTE — Telephone Encounter (Signed)
Message copied by Tobin Chad on Thu Jun 03, 2013 12:11 PM ------      Message from: Marykay Lex      Created: Wed Jun 02, 2013 12:22 AM       Chemistry panel is much better.  LFTs are notably improved.            Lipids - are not all that bad.  LDL goal for him is < 70, so not there but almost at the intermediate goal of < 100.  HDL is a bit low -- need to exercise!!             Marykay Lex, MD       ------

## 2014-02-13 ENCOUNTER — Emergency Department (HOSPITAL_COMMUNITY)
Admission: EM | Admit: 2014-02-13 | Discharge: 2014-02-13 | Disposition: A | Discharge status: Home / Self Care | Payer: Managed Care, Other (non HMO) | Attending: Emergency Medicine | Admitting: Emergency Medicine

## 2014-02-13 ENCOUNTER — Encounter (HOSPITAL_COMMUNITY): Payer: Self-pay | Admitting: Emergency Medicine

## 2014-02-13 DIAGNOSIS — Z7901 Long term (current) use of anticoagulants: Secondary | ICD-10-CM | POA: Insufficient documentation

## 2014-02-13 DIAGNOSIS — S91011A Laceration without foreign body, right ankle, initial encounter: Secondary | ICD-10-CM

## 2014-02-13 DIAGNOSIS — Y9289 Other specified places as the place of occurrence of the external cause: Secondary | ICD-10-CM | POA: Insufficient documentation

## 2014-02-13 DIAGNOSIS — Z9861 Coronary angioplasty status: Secondary | ICD-10-CM | POA: Insufficient documentation

## 2014-02-13 DIAGNOSIS — S81809A Unspecified open wound, unspecified lower leg, initial encounter: Principal | ICD-10-CM

## 2014-02-13 DIAGNOSIS — I10 Essential (primary) hypertension: Secondary | ICD-10-CM | POA: Insufficient documentation

## 2014-02-13 DIAGNOSIS — Y9389 Activity, other specified: Secondary | ICD-10-CM | POA: Insufficient documentation

## 2014-02-13 DIAGNOSIS — E669 Obesity, unspecified: Secondary | ICD-10-CM | POA: Insufficient documentation

## 2014-02-13 DIAGNOSIS — I2510 Atherosclerotic heart disease of native coronary artery without angina pectoris: Secondary | ICD-10-CM | POA: Insufficient documentation

## 2014-02-13 DIAGNOSIS — Z79899 Other long term (current) drug therapy: Secondary | ICD-10-CM | POA: Insufficient documentation

## 2014-02-13 DIAGNOSIS — K219 Gastro-esophageal reflux disease without esophagitis: Secondary | ICD-10-CM | POA: Insufficient documentation

## 2014-02-13 DIAGNOSIS — Z860100 Personal history of colon polyps, unspecified: Secondary | ICD-10-CM | POA: Insufficient documentation

## 2014-02-13 DIAGNOSIS — S91009A Unspecified open wound, unspecified ankle, initial encounter: Principal | ICD-10-CM

## 2014-02-13 DIAGNOSIS — S81009A Unspecified open wound, unspecified knee, initial encounter: Secondary | ICD-10-CM | POA: Insufficient documentation

## 2014-02-13 DIAGNOSIS — F411 Generalized anxiety disorder: Secondary | ICD-10-CM | POA: Insufficient documentation

## 2014-02-13 DIAGNOSIS — W268XXA Contact with other sharp object(s), not elsewhere classified, initial encounter: Secondary | ICD-10-CM | POA: Insufficient documentation

## 2014-02-13 DIAGNOSIS — Z7982 Long term (current) use of aspirin: Secondary | ICD-10-CM | POA: Insufficient documentation

## 2014-02-13 DIAGNOSIS — IMO0001 Reserved for inherently not codable concepts without codable children: Secondary | ICD-10-CM | POA: Insufficient documentation

## 2014-02-13 MED ORDER — TETANUS-DIPHTH-ACELL PERTUSSIS 5-2.5-18.5 LF-MCG/0.5 IM SUSP
0.5000 mL | Freq: Once | INTRAMUSCULAR | Status: AC
  Administered 2014-02-13: 0.5 mL via INTRAMUSCULAR
  Filled 2014-02-13: qty 0.5

## 2014-02-13 NOTE — ED Notes (Signed)
Reviewed wound care and s/s of infection with pt. And pt.s wife. Both verbalized understanding of instructions

## 2014-02-13 NOTE — ED Provider Notes (Signed)
CSN: [id number]     Arrival date & time 02/13/14  1645 History  This chart was scribed for Samuel Chambers, Stevenson working with Samuel Jabs, DO by Samuel Chambers, ED Scribe. This patient was seen in room TR05C/TR05C and the patient's care was started at 5:30 PM.  Chief Complaint  Patient presents with  . Extremity Laceration    The history is provided by the patient. No language interpreter was used.    HPI Comments: Samuel Chambers is a 63 y.o. male who presents to the Emergency Department complaining of a right ankle laceration sustained approximately 30 minutes PTA.  Pt states that he was doing remodeling of his kitchen floor when he scraped his right ankle against a razor.  He now presents with a laceration to that area.  Patient denies any associated pain, numbness, or tingling to the area.  Patient is prescribed Plavix and takes ASA daily.  Patient is unsure whether his tetanus is up to date.    Past Medical History  Diagnosis Date  . Diverticulosis   . Internal hemorrhoids   . Adenomatous colon polyp 03/2005  . Hypertension   . Anxiety   . GERD (gastroesophageal reflux disease)   . Degenerative joint disease   . CAD S/P percutaneous coronary angioplasty 2008    PCI to circumflex with a Promus DES 2.5 mm x 8 mm  . PONV (postoperative nausea and vomiting)   . Hyperlipidemia     statin intolerant  . Obesity, Class II, BMI 35-39.9   . Fibromyalgia     Past Surgical History  Procedure Laterality Date  . Hip surgery      complete  . Heel spur surgery    . Hemorrhoid surgery    . Knee arthroscopy  03/27/2012    Procedure: ARTHROSCOPY KNEE;  Surgeon: Gearlean Alf, MD;  Location: [facility];  Service: Orthopedics;  Laterality: Left;  . Total knee arthroplasty Left 09/04/2012    Procedure: TOTAL KNEE ARTHROPLASTY;  Surgeon: Gearlean Alf, MD;  Location: [facility];  Service: Orthopedics;  Laterality: Left;  . Cholecystectomy N/A 02/18/2013    Procedure: LAPAROSCOPIC CHOLECYSTECTOMY WITH  INTRAOPERATIVE CHOLANGIOGRAM;  Surgeon: Pedro Earls, MD;  Location: [facility];  Service: General;  Laterality: N/A;  . Doppler echocardiography  05/27/2002    [facility]normal EF 55-66%,  . Nm myocar perf wall motion  12/19/2011    EXERCISED FOR 8-1/2 MINUTES RECHING 10 METABOLIC EQUIVALENTS. NO EVIDENCE  OF ISCHEMIA OR INFARCTION . EF 71%  . Renal dopplers  04/08/2007    relatively normal  . Coronary angioplasty with stent placement  04/30/2007    2.5 mm Promus stent that was to 95% Circ;had 60-70% lesion to RCA  . Holter monitor  04/07/2007    sinus tachy.;    Family History  Problem Relation Age of Onset  . Breast cancer Mother   . Diabetes Mother   . Prostate cancer Father     History  Substance Use Topics  . Smoking status: Never Smoker   . Smokeless tobacco: Never Used  . Alcohol Use: No     Review of Systems  Musculoskeletal:       No pain around right ankle.   Skin: Positive for wound.  Neurological: Negative for numbness.  All other systems reviewed and are negative.     Allergies  Codeine; Crestor; Oxycodone; Prednisone; and Warfarin and related  Home Medications   Prior to Admission medications   Medication Sig Start Date  End Date Taking? Authorizing Provider  acetaminophen (TYLENOL) 500 MG tablet Take 1,000 mg by mouth every 6 (six) hours as needed. Pain   Yes Historical Provider, MD  aspirin EC 81 MG tablet Take 81 mg by mouth daily.   Yes Historical Provider, MD  clopidogrel (PLAVIX) 75 MG tablet Take 1 tablet (75 mg total) by mouth daily. 04/28/13  Yes Leonie Man, MD  DULoxetine (CYMBALTA) 60 MG capsule Take 60 mg by mouth every morning.    Yes Historical Provider, MD  esomeprazole (NEXIUM) 20 MG capsule Take 20 mg by mouth daily before breakfast.   Yes Historical Provider, MD  fish oil-omega-3 fatty acids 1000 MG capsule Take 1 g by mouth daily.   Yes Historical Provider, MD  furosemide (LASIX) 20 MG tablet Take 20 mg by mouth daily as needed  for fluid.  02/24/13  Yes Historical Provider, MD  losartan (COZAAR) 50 MG tablet Take 1 tablet (50 mg total) by mouth daily. 04/28/13  Yes Leonie Man, MD  metoprolol succinate (TOPROL-XL) 50 MG 24 hr tablet Take 1 tablet (50 mg total) by mouth every morning. Take with or immediately following a meal. 04/28/13  Yes Leonie Man, MD  nitroGLYCERIN (NITROSTAT) 0.4 MG SL tablet Place 1 tablet (0.4 mg total) under the tongue every 5 (five) minutes as needed for chest pain. 04/28/13  Yes Leonie Man, MD  Tadalafil (CIALIS PO) Take 1 tablet by mouth daily as needed (for intercourse).    Yes Historical Provider, MD   BP 138/80  Pulse 86  Temp(Src) 98.2 F (36.8 C) (Oral)  Resp 16  Ht 6' (1.829 m)  Wt 262 lb (118.842 kg)  BMI 35.53 kg/m2  SpO2 97%  Physical Exam  Nursing note and vitals reviewed. Constitutional: He is oriented to person, place, and time. He appears well-developed and well-nourished. No distress.  HENT:  Head: Normocephalic and atraumatic.  Eyes: Conjunctivae and EOM are normal.  Neck: Normal range of motion. Neck supple.  Cardiovascular: Normal rate, regular rhythm and normal heart sounds.   Pulses:      Dorsalis pedis pulses are 2+ on the right side.       Posterior tibial pulses are 2+ on the right side.  2+ PT/DP pulses.    Pulmonary/Chest: Effort normal and breath sounds normal.  Musculoskeletal: Normal range of motion. He exhibits no edema.  Neurological: He is alert and oriented to person, place, and time.  Sensation intact   Skin: Skin is warm and dry.  2 cm laceration on medial aspect of right ankle near Achilles but does not cross over tendon.  No visible tendon.  Full plantar flexion and dorsiflexion of right ankle.    Psychiatric: He has a normal mood and affect. His behavior is normal.    ED Course  Procedures (including critical care time)  DIAGNOSTIC STUDIES: Oxygen Saturation is 97% on room air, normal by my interpretation.    COORDINATION OF  CARE: 5:36 PM-Discussed treatment plan which includes laceration repair with pt at bedside and pt agreed to plan.    LACERATION REPAIR Performed by: Samuel Mcalpine, PA Consent: Verbal consent obtained. Risks and benefits: risks, benefits and alternatives were discussed Patient identity confirmed: provided demographic data Time out performed prior to procedure Prepped and Draped in normal sterile fashion Wound explored Laceration Location: right ankle Laceration Length: 2 cm No Foreign Bodies seen or palpated Anesthesia: local infiltration Local anesthetic: lidocaine 2% without epinephrine Anesthetic total: 3 ml Irrigation method:  syringe Amount of cleaning: standard Skin closure: 4-0 prolene Number of sutures or staples: 5 Technique: simple interrupted Patient tolerance: Patient tolerated the procedure well with no immediate complications.    Labs Review Labs Reviewed - No data to display  Imaging Review No results found.   EKG Interpretation None      MDM   Final diagnoses:  Laceration of right ankle, initial encounter    He comes in with laceration to his right ankle. He is well appearing and in no apparent distress. Afebrile, vital signs stable. Neurovascularly intact. No evidence of tendon disruption. Wound care given. Tetanus updated. Stable for discharge. Return precautions given. Patient states understanding of treatment care plan and is agreeable.  I personally performed the services described in this documentation, which was scribed in my presence. The recorded information has been reviewed and is accurate.    Illene Labrador, PA-C 02/13/14 [phone]

## 2014-02-13 NOTE — Discharge Instructions (Signed)
Keep wound and clean with mild soap and water and covered with a topical antibiotic ointment and bandage. Ice and elevate for additional pain relief. Alternate between Ibuprofen and Tylenol for additional pain relief. Follow Up: Follow up with the [facility] or primary care doctor in approximately 10-14 days for wound recheck and suture removal. Monitor wound for signs of infection to include but not limited to increasing pain, redness, drainage, or swelling. Return to emergency department for emergent changing or worsening symptoms.  Laceration Care, Adult A laceration is a cut or lesion that goes through all layers of the skin and into the tissue just beneath the skin. TREATMENT  Some lacerations may not require closure. Some lacerations may not be able to be closed due to an increased risk of infection. It is important to see your caregiver as soon as possible after an injury to minimize the risk of infection and maximize the opportunity for successful closure. If closure is appropriate, pain medicines may be given, if needed. The wound will be cleaned to help prevent infection. Your caregiver will use stitches (sutures), staples, wound glue (adhesive), or skin adhesive strips to repair the laceration. These tools bring the skin edges together to allow for faster healing and a better cosmetic outcome. However, all wounds will heal with a scar. Once the wound has healed, scarring can be minimized by covering the wound with sunscreen during the day for 1 full year. HOME CARE INSTRUCTIONS  For sutures or staples:  Keep the wound clean and dry.  If you were given a bandage (dressing), you should change it at least once a day. Also, change the dressing if it becomes wet or dirty, or as directed by your caregiver.  Wash the wound with soap and water 2 times a day. Rinse the wound off with water to remove all soap. Pat the wound dry with a clean towel.  After cleaning, apply a thin  layer of the antibiotic ointment as recommended by your caregiver. This will help prevent infection and keep the dressing from sticking.  You may shower as usual after the first 24 hours. Do not soak the wound in water until the sutures are removed.  Only take over-the-counter or prescription medicines for pain, discomfort, or fever as directed by your caregiver.  Get your sutures or staples removed as directed by your caregiver. For skin adhesive strips:  Keep the wound clean and dry.  Do not get the skin adhesive strips wet. You may bathe carefully, using caution to keep the wound dry.  If the wound gets wet, pat it dry with a clean towel.  Skin adhesive strips will fall off on their own. You may trim the strips as the wound heals. Do not remove skin adhesive strips that are still stuck to the wound. They will fall off in time. For wound adhesive:  You may briefly wet your wound in the shower or bath. Do not soak or scrub the wound. Do not swim. Avoid periods of heavy perspiration until the skin adhesive has fallen off on its own. After showering or bathing, gently pat the wound dry with a clean towel.  Do not apply liquid medicine, cream medicine, or ointment medicine to your wound while the skin adhesive is in place. This may loosen the film before your wound is healed.  If a dressing is placed over the wound, be careful not to apply tape directly over the skin adhesive. This may cause the adhesive to  be pulled off before the wound is healed.  Avoid prolonged exposure to sunlight or tanning lamps while the skin adhesive is in place. Exposure to ultraviolet light in the first year will darken the scar.  The skin adhesive will usually remain in place for 5 to 10 days, then naturally fall off the skin. Do not pick at the adhesive film. You may need a tetanus shot if:  You cannot remember when you had your last tetanus shot.  You have never had a tetanus shot. If you get a tetanus  shot, your arm may swell, get red, and feel warm to the touch. This is common and not a problem. If you need a tetanus shot and you choose not to have one, there is a rare chance of getting tetanus. Sickness from tetanus can be serious. SEEK MEDICAL CARE IF:   You have redness, swelling, or increasing pain in the wound.  You see a red line that goes away from the wound.  You have yellowish-white fluid (pus) coming from the wound.  You have a fever.  You notice a bad smell coming from the wound or dressing.  Your wound breaks open before or after sutures have been removed.  You notice something coming out of the wound such as wood or glass.  Your wound is on your hand or foot and you cannot move a finger or toe. SEEK IMMEDIATE MEDICAL CARE IF:   Your pain is not controlled with prescribed medicine.  You have severe swelling around the wound causing pain and numbness or a change in color in your arm, hand, leg, or foot.  Your wound splits open and starts bleeding.  You have worsening numbness, weakness, or loss of function of any joint around or beyond the wound.  You develop painful lumps near the wound or on the skin anywhere on your body. MAKE SURE YOU:   Understand these instructions.  Will watch your condition.  Will get help right away if you are not doing well or get worse. Document Released: 07/15/2005 Document Revised: 10/07/2011 Document Reviewed: 01/08/2011 [facility] Patient Information 2015 [place], [place]. This information is not intended to replace advice given to you by your health care provider. Make sure you discuss any questions you have with your health care provider.

## 2014-02-13 NOTE — ED Notes (Signed)
Pt was doing remodeling of kitchen floor when he cut himself with a tool to the posterior right ankle. Pt has full range of motion, bleeding noted. Area dressed by EMT Nira Conn.

## 2014-02-13 NOTE — ED Provider Notes (Signed)
Medical screening examination/treatment/procedure(s) were performed by non-physician practitioner and as supervising physician I was immediately available for consultation/collaboration.   EKG Interpretation None        [place], DO 02/13/14 2303

## 2014-04-06 ENCOUNTER — Other Ambulatory Visit (HOSPITAL_COMMUNITY): Payer: Self-pay | Admitting: Orthopedic Surgery

## 2014-04-06 ENCOUNTER — Telehealth: Payer: Self-pay | Admitting: Cardiology

## 2014-04-06 DIAGNOSIS — Z471 Aftercare following joint replacement surgery: Secondary | ICD-10-CM

## 2014-04-06 DIAGNOSIS — Z96652 Presence of left artificial knee joint: Principal | ICD-10-CM

## 2014-04-06 NOTE — Telephone Encounter (Signed)
Pt is having an injection which will have dye, she wants to know will this be all right for the pt since he has a stent? After 12:30 you call call her at [phone]

## 2014-04-06 NOTE — Telephone Encounter (Signed)
Wife calling upset - Dr. Maureen Ralphs has ordered a bone scan for continued knee pain only 1 1/2 years s/p replacement which is requiring an injection of dye.  Wife is concerned about recent stent placement and will this injection cause any problems with his stent/heart?  Message sent to Dr. Ellyn Hack for review and advise.

## 2014-04-06 NOTE — Telephone Encounter (Signed)
Discussed with Pamala Hurry.  Agree.  Leonie Man, MD

## 2014-04-06 NOTE — Telephone Encounter (Signed)
Per Dr. Ellyn Hack - if the scan requires any dye it will be OK.

## 2014-04-11 NOTE — Telephone Encounter (Signed)
Patient's wife notified 04/06/14 and voiced appreciation and understanding.

## 2014-04-19 ENCOUNTER — Other Ambulatory Visit: Payer: Self-pay | Admitting: Cardiology

## 2014-04-19 NOTE — Telephone Encounter (Signed)
Rx was sent to pharmacy electronically. 

## 2014-04-20 ENCOUNTER — Ambulatory Visit (HOSPITAL_COMMUNITY)
Admission: RE | Admit: 2014-04-20 | Discharge: 2014-04-20 | Disposition: A | Discharge status: Home / Self Care | Payer: Managed Care, Other (non HMO) | Source: Ambulatory Visit | Attending: Orthopedic Surgery | Admitting: Orthopedic Surgery

## 2014-04-20 ENCOUNTER — Encounter (HOSPITAL_COMMUNITY)
Admission: RE | Admit: 2014-04-20 | Discharge: 2014-04-20 | Disposition: A | Discharge status: Home / Self Care | Payer: Managed Care, Other (non HMO) | Source: Ambulatory Visit | Attending: Orthopedic Surgery | Admitting: Orthopedic Surgery

## 2014-04-20 ENCOUNTER — Encounter (HOSPITAL_COMMUNITY): Payer: Managed Care, Other (non HMO)

## 2014-04-20 DIAGNOSIS — M25569 Pain in unspecified knee: Secondary | ICD-10-CM | POA: Insufficient documentation

## 2014-04-20 DIAGNOSIS — Z96659 Presence of unspecified artificial knee joint: Secondary | ICD-10-CM | POA: Insufficient documentation

## 2014-04-20 DIAGNOSIS — Z471 Aftercare following joint replacement surgery: Secondary | ICD-10-CM

## 2014-04-20 DIAGNOSIS — Z96652 Presence of left artificial knee joint: Principal | ICD-10-CM

## 2014-04-20 MED ORDER — TECHNETIUM TC 99M MEDRONATE IV KIT
26.0000 | PACK | Freq: Once | INTRAVENOUS | Status: AC | PRN
  Administered 2014-04-20: 26 via INTRAVENOUS

## 2014-05-17 ENCOUNTER — Other Ambulatory Visit: Payer: Self-pay | Admitting: Cardiology

## 2014-05-17 NOTE — Telephone Encounter (Signed)
Rx was sent to pharmacy electronically. 

## 2014-05-23 ENCOUNTER — Telehealth: Payer: Self-pay | Admitting: Cardiology

## 2014-05-23 NOTE — Telephone Encounter (Signed)
Samuel Chambers is calling because of the medication which Samuel Chambers is taking for his heart is causing him  trouble with  taking any medication that can cause any relief from any pain that he is constantly in from his knee replacement. Please Call   Thanks

## 2014-05-23 NOTE — Telephone Encounter (Signed)
I am not sure what medication he is taking for his heart that would make him hurt -- I don't have a statin or fibrate listed.   His stent was in 2012.   If the knee has infection - then the pain is probably related to that & not cardiac med.  [place]

## 2014-05-23 NOTE — Telephone Encounter (Signed)
Spoke to Mrs. Stefanko (wife of pt). She states that [name]) is in "excruciating" pain all of the time. She would like to know if Dr. Ellyn Hack can do something to help him with the pain, but does not want pain medication(s). Pt had knee replacement almost 2 years ago by Dr. Maureen Ralphs. Pt had Dr. Maureen Ralphs assess the pain and it was found that infection was in the bone. Blood work was done, but results were normal. Pt was unsatisfied with findings because pain is still occurring. Wife stated needing a 2nd opinion as suggested by Dr. Maureen Ralphs. Informed Mrs. Gallaway that Dr. Ellyn Hack was not in office, but he will be informed of what is going on. A phone call will be returned once more information is received from Dr. Ellyn Hack.

## 2014-05-23 NOTE — Telephone Encounter (Signed)
I am not a good "2nd opinion for an Orthopedic condition"  [facility]

## 2014-05-25 NOTE — Telephone Encounter (Signed)
Spoke to wife  She states she  and patient  Would like Dr Ellyn Hack what would a good anti-inflammatory medication to take  she states Dr Maureen Ralphs did not have any answer. She states they are not seeking any pain medications. Will defer to Dr Ellyn Hack and call her back She verbalized understanding.

## 2014-05-25 NOTE — Telephone Encounter (Signed)
No "good options".  Voltaren gel topical application is helpful. All of the oral NSAIDS are associated with increased CV risk.   I tend to think COX2 inhibitors like Celebrex are better.  Leonie Man, MD

## 2014-05-31 NOTE — Telephone Encounter (Signed)
Spoke to wife   information given to wife  she states patient has voltaren gel already. She thanked Therapist, sports, patient has an appointment in jan 2016

## 2014-06-10 ENCOUNTER — Other Ambulatory Visit: Payer: Self-pay | Admitting: Cardiology

## 2014-06-10 NOTE — Telephone Encounter (Signed)
Rx was sent to pharmacy electronically. OV 08/02/2014

## 2014-07-18 ENCOUNTER — Other Ambulatory Visit: Payer: Self-pay | Admitting: Cardiology

## 2014-07-18 ENCOUNTER — Telehealth: Payer: Self-pay | Admitting: Cardiology

## 2014-07-18 MED ORDER — CLOPIDOGREL BISULFATE 75 MG PO TABS: 75.0000 mg | ORAL_TABLET | Freq: Every day | ORAL | Status: DC

## 2014-07-18 MED ORDER — LOSARTAN POTASSIUM 50 MG PO TABS: 50.0000 mg | ORAL_TABLET | Freq: Every day | ORAL | Status: DC

## 2014-07-18 NOTE — Telephone Encounter (Signed)
Refilled

## 2014-07-18 NOTE — Addendum Note (Signed)
Addended by: Vear Clock on: 07/18/2014 10:54 AM   Modules accepted: Orders

## 2014-07-18 NOTE — Telephone Encounter (Signed)
Pt need a new prescription for his Clopidogrel 75 mg #30.Please call to [phone](not sure this is the correct phone number). Pt has an appointment scheduled in January.

## 2014-07-18 NOTE — Telephone Encounter (Signed)
Rx refill sent to patient pharmacy   

## 2014-07-23 ENCOUNTER — Other Ambulatory Visit: Payer: Self-pay | Admitting: Cardiology

## 2014-07-25 NOTE — Telephone Encounter (Signed)
Rx(s) sent to pharmacy electronically. OV 08/02/2014

## 2014-07-29 DIAGNOSIS — J189 Pneumonia, unspecified organism: Secondary | ICD-10-CM

## 2014-07-29 DIAGNOSIS — K760 Fatty (change of) liver, not elsewhere classified: Secondary | ICD-10-CM

## 2014-07-29 HISTORY — DX: Pneumonia, unspecified organism: J18.9

## 2014-07-29 HISTORY — DX: Fatty (change of) liver, not elsewhere classified: K76.0

## 2014-08-02 ENCOUNTER — Encounter: Payer: Self-pay | Admitting: Cardiology

## 2014-08-02 ENCOUNTER — Ambulatory Visit (INDEPENDENT_AMBULATORY_CARE_PROVIDER_SITE_OTHER): Payer: BLUE CROSS/BLUE SHIELD | Admitting: Cardiology

## 2014-08-02 VITALS — BP 140/90 | HR 76 | Ht 72.0 in | Wt 271.0 lb

## 2014-08-02 DIAGNOSIS — I10 Essential (primary) hypertension: Secondary | ICD-10-CM

## 2014-08-02 DIAGNOSIS — E669 Obesity, unspecified: Secondary | ICD-10-CM

## 2014-08-02 DIAGNOSIS — I2510 Atherosclerotic heart disease of native coronary artery without angina pectoris: Secondary | ICD-10-CM

## 2014-08-02 DIAGNOSIS — E785 Hyperlipidemia, unspecified: Secondary | ICD-10-CM

## 2014-08-02 DIAGNOSIS — Z9861 Coronary angioplasty status: Secondary | ICD-10-CM

## 2014-08-02 MED ORDER — CLOPIDOGREL BISULFATE 75 MG PO TABS: 75.0000 mg | ORAL_TABLET | Freq: Every day | ORAL | Status: DC

## 2014-08-02 MED ORDER — LOSARTAN POTASSIUM 50 MG PO TABS: 50.0000 mg | ORAL_TABLET | Freq: Every day | ORAL | Status: DC

## 2014-08-02 MED ORDER — FUROSEMIDE 20 MG PO TABS: 20.0000 mg | ORAL_TABLET | Freq: Every day | ORAL | Status: DC | PRN

## 2014-08-02 MED ORDER — DULOXETINE HCL 60 MG PO CPEP: 60.0000 mg | ORAL_CAPSULE | Freq: Every morning | ORAL | Status: DC

## 2014-08-02 MED ORDER — NITROGLYCERIN 0.4 MG SL SUBL: 0.4000 mg | SUBLINGUAL_TABLET | SUBLINGUAL | Status: DC | PRN

## 2014-08-02 MED ORDER — METOPROLOL SUCCINATE ER 50 MG PO TB24: 50.0000 mg | ORAL_TABLET | Freq: Every day | ORAL | Status: DC

## 2014-08-02 NOTE — Patient Instructions (Addendum)
Your physician recommends that you return for lab work in a few weeks - fasting  Please come in for a BP check with the triage nurse when you come for lab work.   Your physician wants you to follow-up in: 1 year. You will receive a reminder letter in the mail two months in advance. If you don't receive a letter, please call our office to schedule the follow-up appointment.

## 2014-08-04 ENCOUNTER — Encounter: Payer: Self-pay | Admitting: Cardiology

## 2014-08-04 DIAGNOSIS — Z6835 Body mass index (BMI) 35.0-35.9, adult: Secondary | ICD-10-CM

## 2014-08-04 NOTE — Assessment & Plan Note (Signed)
Stable from cardiac standpoint. No active anginal symptoms. On aspirin plus Plavix. Stop aspirin. On beta blocker and ARB. Non-statin for the to tolerance. Recent negative Myoview

## 2014-08-04 NOTE — Progress Notes (Signed)
PCP: No primary care provider on file.  Clinic Note: Chief Complaint  Patient presents with  . Annual Exam    left lower extremity edema (orthopaedic related)  . Coronary Artery Disease    PCI to Cx in 2008, moderate residual RCA disease   HPI: Samuel Chambers is a 64 y.o. male with a PMH below who presents today for delayed one year followup for his CAD and cardiac risk factors.  PCI in 2008, normal Myoview in 2013. Apparently with his prior insurance company he was not covered for his originally scheduled followup visit. Now he is insurance is from a different company.  Past Medical History  Diagnosis Date  . Diverticulosis   . Internal hemorrhoids   . Adenomatous colon polyp 03/2005  . Hypertension   . Anxiety   . GERD (gastroesophageal reflux disease)   . Degenerative joint disease   . CAD S/P percutaneous coronary angioplasty 2008    PCI to circumflex with a Promus DES 2.5 mm x 8 mm  . PONV (postoperative nausea and vomiting)   . Hyperlipidemia     statin intolerant  . Obesity, Class II, BMI 35-39.9   . Fibromyalgia     Prior Cardiac Evaluation and Past Surgical History: Procedure Laterality Date  . Doppler echocardiography  05/27/2002    [facility]normal EF 55-66%,  . Nm myocar perf wall motion  12/19/2011    EXERCISED FOR 8-1/2 MINUTES RECHING 10 METABOLIC EQUIVALENTS. NO EVIDENCE  OF ISCHEMIA OR INFARCTION . EF 71%  . Renal dopplers  04/08/2007    relatively normal  . Coronary angioplasty with stent placement  04/30/2007    2.5 mm Promus stent that was to 95% Circ;had 60-70% lesion to RCA  . Holter monitor  04/07/2007    sinus tachy.;   Interval History: cardiac standpoint he is doing quite well over the last one year plus. He has been less physically active than he would hope because his knee continues to give him problems and he finally ended up with total knee arthroplasty. This still bothersome it limits his ability to walk. He still has swelling in that  knee. He also is a little frustrated because his primary physician switched over to do and only sleep medicine, so he is not certain for new primary care doctor. From a cardiac standpoint he is not noticing any chest pressure with any rest or exertion. No resting or exertional dyspnea.  He is notably deconditioned and will get short of breath if he really pushes it. He denies any heart failure symptoms of PND, orthopnea or edema. No rapid or irregular heartbeat/palpitationsabout syncope/near syncope or TIA/amaurosis fugax symptoms.  ROS: A comprehensive was performed. Review of Systems  Constitutional: Negative for weight loss and malaise/fatigue.  HENT: Negative for nosebleeds.   Respiratory: Negative for cough, shortness of breath and wheezing.   Cardiovascular: Positive for leg swelling (Just his left knee that sometimes goes down to the mid leg). Negative for claudication.  Gastrointestinal: Negative for blood in stool and melena.  Genitourinary: Negative for hematuria.  Musculoskeletal: Positive for myalgias (Fibromyalgia related) and joint pain (Left knee from multiple surgeries and arthroplasty).  Endo/Heme/Allergies: Does not bruise/bleed easily.  Psychiatric/Behavioral: Negative for memory loss. The patient is not nervous/anxious and does not have insomnia.   All other systems reviewed and are negative.   Current Outpatient Prescriptions on File Prior to Visit  Medication Sig Dispense Refill  . acetaminophen (TYLENOL) 500 MG tablet Take 1,000 mg by mouth  every 6 (six) hours as needed. Pain    . aspirin EC 81 MG tablet Take 81 mg by mouth daily.    Marland Kitchen esomeprazole (NEXIUM) 20 MG capsule Take 20 mg by mouth daily before breakfast.    . fish oil-omega-3 fatty acids 1000 MG capsule Take 1 g by mouth daily.    . Tadalafil (CIALIS PO) Take 1 tablet by mouth daily as needed (for intercourse).      No current facility-administered medications on file prior to visit.   Allergies  Allergen  Reactions  . Codeine Other (See Comments)    Reaction unknown  . Crestor [Rosuvastatin] Other (See Comments)    myalgias  . Oxycodone Other (See Comments)    hallucinations  . Prednisone Other (See Comments)    Reaction unknown  . Warfarin And Related Other (See Comments)    Fast heart beat, went down hill, felt like he was dying   SOCIAL AND FAMILY HISTORY REVIEWED IN EPIC -- No change  Wt Readings from Last 3 Encounters:  08/02/14 271 lb (122.925 kg)  02/13/14 262 lb (118.842 kg)  04/28/13 271 lb 12.8 oz (123.288 kg)    PHYSICAL EXAM BP 140/90 mmHg  Pulse 76  Ht 6' (1.829 m)  Wt 271 lb (122.925 kg)  BMI 36.75 kg/m2 General appearance: alert, cooperative, appears stated age, no distress and mildly obese Neck: no adenopathy, no carotid bruit and no JVD Lungs: clear to auscultation bilaterally, normal percussion bilaterally and non-labored Heart: regular rate and rhythm, S1, S2 normal, no murmur, click, rub or gallop; nondisplaced PMI Abdomen: soft, non-tender; bowel sounds normal; no masses, no organomegaly; ho HJR Extremities: extremities normal, atraumatic, no cyanosis, and edema; the L knee remains somewhat swollen and tender Pulses: 2+ and symmetric; Neurologic: Mental status: Alert, oriented, thought content appropriate Cranial nerves: normal (II-XII grossly intact)   Adult ECG Report  Rate: 76 ;  Rhythm: normal sinus rhythm and Normal axis, intervals and durations. Normal voltage  Narrative Interpretation: normal EKG  Recent Labs:  None since 20 14th   ASSESSMENT / PLAN: CAD S/P percutaneous coronary angioplasty Stable from cardiac standpoint. No active anginal symptoms. On aspirin plus Plavix. Stop aspirin. On beta blocker and ARB. Non-statin for the to tolerance. Recent negative Myoview  Hyperlipidemia with target LDL less than 70; statin intolerant Last labs were from November 2014. Not adequately controlled with an LDL of 105. HDL was 34 and total  cholesterol 169. He is on fish oil.  Recheck lipids. LDL still not at goal, will consider trying pravastatin since he had intolerance of Crestor and Lipitor past.  Essential hypertension Blood pressure is just a bit at the upper limit of normal today. I asked him to come up for nursing blood pressure checked when he comes to get his stone. If his blood pressure remains at this level or higher, I would increase his Cozaar to 100 mg.  Obesity (BMI 30-39.9) The patient understands the need to lose weight with diet and exercise. We have discussed specific strategies for this.    Orders Placed This Encounter  Procedures  . Hepatic function panel  . Lipid panel  . EKG 12-Lead    Medications refilled Meds ordered this encounter  Medications  . metoprolol succinate (TOPROL-XL) 50 MG 24 hr tablet    Sig: Take 1 tablet (50 mg total) by mouth daily.    Dispense:  90 tablet    Refill:  3  . losartan (COZAAR) 50 MG tablet  Sig: Take 1 tablet (50 mg total) by mouth daily.    Dispense:  90 tablet    Refill:  3  . furosemide (LASIX) 20 MG tablet    Sig: Take 1 tablet (20 mg total) by mouth daily as needed for fluid.    Dispense:  90 tablet    Refill:  3  . clopidogrel (PLAVIX) 75 MG tablet    Sig: Take 1 tablet (75 mg total) by mouth daily.    Dispense:  90 tablet    Refill:  3  . DULoxetine (CYMBALTA) 60 MG capsule    Sig: Take 1 capsule (60 mg total) by mouth every morning.    Dispense:  90 capsule    Refill:  3  . nitroGLYCERIN (NITROSTAT) 0.4 MG SL tablet    Sig: Place 1 tablet (0.4 mg total) under the tongue every 5 (five) minutes as needed for chest pain.    Dispense:  25 tablet    Refill:  3     Followup: 1 yr   HARDING, Leonie Green, M.D., M.S. Interventional Cardiologist   Pager # [phone]

## 2014-08-04 NOTE — Assessment & Plan Note (Signed)
Last labs were from November 2014. Not adequately controlled with an LDL of 105. HDL was 34 and total cholesterol 169. He is on fish oil.  Recheck lipids. LDL still not at goal, will consider trying pravastatin since he had intolerance of Crestor and Lipitor past.

## 2014-08-04 NOTE — Assessment & Plan Note (Signed)
The patient understands the need to lose weight with diet and exercise. We have discussed specific strategies for this.  

## 2014-08-04 NOTE — Assessment & Plan Note (Signed)
Blood pressure is just a bit at the upper limit of normal today. I asked him to come up for nursing blood pressure checked when he comes to get his stone. If his blood pressure remains at this level or higher, I would increase his Cozaar to 100 mg.

## 2014-09-03 ENCOUNTER — Encounter (HOSPITAL_COMMUNITY): Payer: Self-pay | Admitting: *Deleted

## 2014-09-03 ENCOUNTER — Emergency Department (HOSPITAL_COMMUNITY): Payer: BLUE CROSS/BLUE SHIELD

## 2014-09-03 ENCOUNTER — Emergency Department (HOSPITAL_COMMUNITY)
Admission: EM | Admit: 2014-09-03 | Discharge: 2014-09-03 | Disposition: A | Discharge status: Home / Self Care | Payer: BLUE CROSS/BLUE SHIELD | Attending: Emergency Medicine | Admitting: Emergency Medicine

## 2014-09-03 DIAGNOSIS — Z9861 Coronary angioplasty status: Secondary | ICD-10-CM | POA: Insufficient documentation

## 2014-09-03 DIAGNOSIS — E669 Obesity, unspecified: Secondary | ICD-10-CM | POA: Diagnosis not present

## 2014-09-03 DIAGNOSIS — Z86018 Personal history of other benign neoplasm: Secondary | ICD-10-CM | POA: Insufficient documentation

## 2014-09-03 DIAGNOSIS — S0990XA Unspecified injury of head, initial encounter: Secondary | ICD-10-CM | POA: Diagnosis present

## 2014-09-03 DIAGNOSIS — W01198A Fall on same level from slipping, tripping and stumbling with subsequent striking against other object, initial encounter: Secondary | ICD-10-CM | POA: Diagnosis not present

## 2014-09-03 DIAGNOSIS — Z7902 Long term (current) use of antithrombotics/antiplatelets: Secondary | ICD-10-CM | POA: Diagnosis not present

## 2014-09-03 DIAGNOSIS — I10 Essential (primary) hypertension: Secondary | ICD-10-CM | POA: Insufficient documentation

## 2014-09-03 DIAGNOSIS — K219 Gastro-esophageal reflux disease without esophagitis: Secondary | ICD-10-CM | POA: Insufficient documentation

## 2014-09-03 DIAGNOSIS — I2510 Atherosclerotic heart disease of native coronary artery without angina pectoris: Secondary | ICD-10-CM | POA: Diagnosis not present

## 2014-09-03 DIAGNOSIS — Z79899 Other long term (current) drug therapy: Secondary | ICD-10-CM | POA: Diagnosis not present

## 2014-09-03 DIAGNOSIS — Z7982 Long term (current) use of aspirin: Secondary | ICD-10-CM | POA: Insufficient documentation

## 2014-09-03 DIAGNOSIS — Y9389 Activity, other specified: Secondary | ICD-10-CM | POA: Diagnosis not present

## 2014-09-03 DIAGNOSIS — E785 Hyperlipidemia, unspecified: Secondary | ICD-10-CM | POA: Insufficient documentation

## 2014-09-03 DIAGNOSIS — S01311A Laceration without foreign body of right ear, initial encounter: Secondary | ICD-10-CM | POA: Diagnosis not present

## 2014-09-03 DIAGNOSIS — F419 Anxiety disorder, unspecified: Secondary | ICD-10-CM | POA: Diagnosis not present

## 2014-09-03 DIAGNOSIS — Y9289 Other specified places as the place of occurrence of the external cause: Secondary | ICD-10-CM | POA: Diagnosis not present

## 2014-09-03 DIAGNOSIS — M797 Fibromyalgia: Secondary | ICD-10-CM | POA: Diagnosis not present

## 2014-09-03 DIAGNOSIS — Y998 Other external cause status: Secondary | ICD-10-CM | POA: Insufficient documentation

## 2014-09-03 LAB — I-STAT CHEM 8, ED
BUN: 17 mg/dL (ref 6–23)
Calcium, Ion: 1.15 mmol/L (ref 1.13–1.30)
Chloride: 101 mmol/L (ref 96–112)
Creatinine, Ser: 0.9 mg/dL (ref 0.50–1.35)
Glucose, Bld: 110 mg/dL — ABNORMAL HIGH (ref 70–99)
HCT: 46 % (ref 39.0–52.0)
Hemoglobin: 15.6 g/dL (ref 13.0–17.0)
Potassium: 4.2 mmol/L (ref 3.5–5.1)
SODIUM: 140 mmol/L (ref 135–145)
TCO2: 22 mmol/L (ref 0–100)

## 2014-09-03 MED ORDER — LIDOCAINE HCL 2 % IJ SOLN
10.0000 mL | Freq: Once | INTRAMUSCULAR | Status: AC
  Administered 2014-09-03: 200 mg
  Filled 2014-09-03: qty 20

## 2014-09-03 MED ORDER — HYDROCODONE-ACETAMINOPHEN 5-325 MG PO TABS: 1.0000 | ORAL_TABLET | Freq: Four times a day (QID) | ORAL | Status: DC | PRN

## 2014-09-03 MED ORDER — HYDROCODONE-ACETAMINOPHEN 5-325 MG PO TABS
1.0000 | ORAL_TABLET | Freq: Once | ORAL | Status: AC
  Administered 2014-09-03: 1 via ORAL
  Filled 2014-09-03: qty 1

## 2014-09-03 NOTE — ED Provider Notes (Signed)
Pt signed out to me by [employer], PA-C. Pt here with ear laceration, on plavix. Plan is to f/u on head CT to ensure no acute bleeds. Pt scheduled to f/u with ENT. CT head shows no acute intracranial abnormalities. Vital signs stable. Patient appears well, states he is ready go home. Pain managed in ED. He refuses any bandages or wraps to his ear or head at this time. Prefers to follow-up with ENT for his regularly scheduled appointment. Discussed all associated labs and imaging with the patient and allow the past questions prior to discharge. They verbalized understanding and have no questions at this time.  Filed Vitals:   09/03/14 1506  BP: 158/95  Pulse: 97  Temp: 97.7 F (36.5 C)  TempSrc: Oral  Resp: 20  SpO2: 100%     Meds given in ED:  Medications  lidocaine (XYLOCAINE) 2 % (with pres) injection 200 mg (not administered)  HYDROcodone-acetaminophen (NORCO/VICODIN) 5-325 MG per tablet 1 tablet (not administered)    New Prescriptions   HYDROCODONE-ACETAMINOPHEN (NORCO) 5-325 MG PER TABLET    Take 1 tablet by mouth every 6 (six) hours as needed for severe pain.     Viona Gilmore [place], PA-C 09/03/14 1817  Charlesetta Shanks, MD 09/03/14 [phone]

## 2014-09-03 NOTE — ED Notes (Signed)
PT currently taking Plavix.

## 2014-09-03 NOTE — Discharge Instructions (Signed)
Keep wound and clean with mild soap and water. Keep area covered with a topical antibiotic ointment and bandage, keep bandage dry, and do not submerge in water for 24 hours. Ice and elevate for additional pain relief and swelling. Use norco for additional pain relief, don't drive while taking norco. Follow up with Dr. Benjamine Mola (ENT) on Monday for wound recheck and suture removal. Monitor area for signs of infection to include, but not limited to: increasing pain, redness, drainage/pus, or swelling. Return to emergency department for emergent changing or worsening symptoms.    Blunt Trauma You have been evaluated for injuries. You have been examined and your caregiver has not found injuries serious enough to require hospitalization. It is common to have multiple bruises and sore muscles following an accident. These tend to feel worse for the first 24 hours. You will feel more stiffness and soreness over the next several hours and worse when you wake up the first morning after your accident. After this point, you should begin to improve with each passing day. The amount of improvement depends on the amount of damage done in the accident. Following your accident, if some part of your body does not work as it should, or if the pain in any area continues to increase, you should return to the Emergency Department for re-evaluation.  HOME CARE INSTRUCTIONS  Routine care for sore areas should include:  Ice to sore areas every 2 hours for 20 minutes while awake for the next 2 days.  Drink extra fluids (not alcohol).  Take a hot or warm shower or bath once or twice a day to increase blood flow to sore muscles. This will help you "limber up".  Activity as tolerated. Lifting may aggravate neck or back pain.  Only take over-the-counter or prescription medicines for pain, discomfort, or fever as directed by your caregiver. Do not use aspirin. This may increase bruising or increase bleeding if there are small areas  where this is happening. SEEK IMMEDIATE MEDICAL CARE IF:  Numbness, tingling, weakness, or problem with the use of your arms or legs.  A severe headache is not relieved with medications.  There is a change in bowel or bladder control.  Increasing pain in any areas of the body.  Short of breath or dizzy.  Nauseated, vomiting, or sweating.  Increasing belly (abdominal) discomfort.  Blood in urine, stool, or vomiting blood.  Pain in either shoulder in an area where a shoulder strap would be.  Feelings of lightheadedness or if you have a fainting episode. Sometimes it is not possible to identify all injuries immediately after the trauma. It is important that you continue to monitor your condition after the emergency department visit. If you feel you are not improving, or improving more slowly than should be expected, call your physician. If you feel your symptoms (problems) are worsening, return to the Emergency Department immediately. Document Released: 04/10/2001 Document Revised: 10/07/2011 Document Reviewed: 03/02/2008 [facility] Patient Information 2015 [place], [place]. This information is not intended to replace advice given to you by your health care provider. Make sure you discuss any questions you have with your health care provider.  Facial Laceration  A facial laceration is a cut on the face. These injuries can be painful and cause bleeding. Lacerations usually heal quickly, but they need special care to reduce scarring. DIAGNOSIS  Your health care provider will take a medical history, ask for details about how the injury occurred, and examine the wound to determine how deep the  cut is. TREATMENT  Some facial lacerations may not require closure. Others may not be able to be closed because of an increased risk of infection. The risk of infection and the chance for successful closure will depend on various factors, including the amount of time since the injury occurred. The wound  may be cleaned to help prevent infection. If closure is appropriate, pain medicines may be given if needed. Your health care provider will use stitches (sutures), wound glue (adhesive), or skin adhesive strips to repair the laceration. These tools bring the skin edges together to allow for faster healing and a better cosmetic outcome. If needed, you may also be given a tetanus shot. HOME CARE INSTRUCTIONS  Only take over-the-counter or prescription medicines as directed by your health care provider.  Follow your health care provider's instructions for wound care. These instructions will vary depending on the technique used for closing the wound. For Sutures:  Keep the wound clean and dry.   If you were given a bandage (dressing), you should change it at least once a day. Also change the dressing if it becomes wet or dirty, or as directed by your health care provider.   Wash the wound with soap and water 2 times a day. Rinse the wound off with water to remove all soap. Pat the wound dry with a clean towel.   After cleaning, apply a thin layer of the antibiotic ointment recommended by your health care provider. This will help prevent infection and keep the dressing from sticking.   You may shower as usual after the first 24 hours. Do not soak the wound in water until the sutures are removed.   Get your sutures removed as directed by your health care provider. With facial lacerations, sutures should usually be taken out after 4-5 days to avoid stitch marks.   Wait a few days after your sutures are removed before applying any makeup. For Skin Adhesive Strips:  Keep the wound clean and dry.   Do not get the skin adhesive strips wet. You may bathe carefully, using caution to keep the wound dry.   If the wound gets wet, pat it dry with a clean towel.   Skin adhesive strips will fall off on their own. You may trim the strips as the wound heals. Do not remove skin adhesive strips that  are still stuck to the wound. They will fall off in time.  For Wound Adhesive:  You may briefly wet your wound in the shower or bath. Do not soak or scrub the wound. Do not swim. Avoid periods of heavy sweating until the skin adhesive has fallen off on its own. After showering or bathing, gently pat the wound dry with a clean towel.   Do not apply liquid medicine, cream medicine, ointment medicine, or makeup to your wound while the skin adhesive is in place. This may loosen the film before your wound is healed.   If a dressing is placed over the wound, be careful not to apply tape directly over the skin adhesive. This may cause the adhesive to be pulled off before the wound is healed.   Avoid prolonged exposure to sunlight or tanning lamps while the skin adhesive is in place.  The skin adhesive will usually remain in place for 5-10 days, then naturally fall off the skin. Do not pick at the adhesive film.  After Healing: Once the wound has healed, cover the wound with sunscreen during the day for 1 full year.  This can help minimize scarring. Exposure to ultraviolet light in the first year will darken the scar. It can take 1-2 years for the scar to lose its redness and to heal completely.  SEEK IMMEDIATE MEDICAL CARE IF:  You have redness, pain, or swelling around the wound.   You see ayellowish-white fluid (pus) coming from the wound.   You have chills or a fever.  MAKE SURE YOU:  Understand these instructions.  Will watch your condition.  Will get help right away if you are not doing well or get worse. Document Released: 08/22/2004 Document Revised: 05/05/2013 Document Reviewed: 02/25/2013 [facility] Patient Information 2015 [place], [place]. This information is not intended to replace advice given to you by your health care provider. Make sure you discuss any questions you have with your health care provider.  Cryotherapy Cryotherapy means treatment with cold. Ice or gel packs  can be used to reduce both pain and swelling. Ice is the most helpful within the first 24 to 48 hours after an injury or flare-up from overusing a muscle or joint. Sprains, strains, spasms, burning pain, shooting pain, and aches can all be eased with ice. Ice can also be used when recovering from surgery. Ice is effective, has very few side effects, and is safe for most people to use. PRECAUTIONS  Ice is not a safe treatment option for people with:  Raynaud phenomenon. This is a condition affecting small blood vessels in the extremities. Exposure to cold may cause your problems to return.  Cold hypersensitivity. There are many forms of cold hypersensitivity, including:  Cold urticaria. Red, itchy hives appear on the skin when the tissues begin to warm after being iced.  Cold erythema. This is a red, itchy rash caused by exposure to cold.  Cold hemoglobinuria. Red blood cells break down when the tissues begin to warm after being iced. The hemoglobin that carry oxygen are passed into the urine because they cannot combine with blood proteins fast enough.  Numbness or altered sensitivity in the area being iced. If you have any of the following conditions, do not use ice until you have discussed cryotherapy with your caregiver:  Heart conditions, such as arrhythmia, angina, or chronic heart disease.  High blood pressure.  Healing wounds or open skin in the area being iced.  Current infections.  Rheumatoid arthritis.  Poor circulation.  Diabetes. Ice slows the blood flow in the region it is applied. This is beneficial when trying to stop inflamed tissues from spreading irritating chemicals to surrounding tissues. However, if you expose your skin to cold temperatures for too long or without the proper protection, you can damage your skin or nerves. Watch for signs of skin damage due to cold. HOME CARE INSTRUCTIONS Follow these tips to use ice and cold packs safely.  Place a dry or damp  towel between the ice and skin. A damp towel will cool the skin more quickly, so you may need to shorten the time that the ice is used.  For a more rapid response, add gentle compression to the ice.  Ice for no more than 10 to 20 minutes at a time. The bonier the area you are icing, the less time it will take to get the benefits of ice.  Check your skin after 5 minutes to make sure there are no signs of a poor response to cold or skin damage.  Rest 20 minutes or more between uses.  Once your skin is numb, you can end your treatment.  You can test numbness by very lightly touching your skin. The touch should be so light that you do not see the skin dimple from the pressure of your fingertip. When using ice, most people will feel these normal sensations in this order: cold, burning, aching, and numbness.  Do not use ice on someone who cannot communicate their responses to pain, such as small children or people with dementia. HOW TO MAKE AN ICE PACK Ice packs are the most common way to use ice therapy. Other methods include ice massage, ice baths, and cryosprays. Muscle creams that cause a cold, tingly feeling do not offer the same benefits that ice offers and should not be used as a substitute unless recommended by your caregiver. To make an ice pack, do one of the following:  Place crushed ice or a bag of frozen vegetables in a sealable plastic bag. Squeeze out the excess air. Place this bag inside another plastic bag. Slide the bag into a pillowcase or place a damp towel between your skin and the bag.  Mix 3 parts water with 1 part rubbing alcohol. Freeze the mixture in a sealable plastic bag. When you remove the mixture from the freezer, it will be slushy. Squeeze out the excess air. Place this bag inside another plastic bag. Slide the bag into a pillowcase or place a damp towel between your skin and the bag. SEEK MEDICAL CARE IF:  You develop white spots on your skin. This may give the skin a  blotchy (mottled) appearance.  Your skin turns blue or pale.  Your skin becomes waxy or hard.  Your swelling gets worse. MAKE SURE YOU:   Understand these instructions.  Will watch your condition.  Will get help right away if you are not doing well or get worse. Document Released: 03/11/2011 Document Revised: 11/29/2013 Document Reviewed: 03/11/2011 [facility] Patient Information 2015 [place], [place]. This information is not intended to replace advice given to you by your health care provider. Make sure you discuss any questions you have with your health care provider.  Stitches, Staples, or Skin Adhesive Strips  Stitches (sutures), staples, and skin adhesive strips hold the skin together as it heals. They will usually be in place for 7 days or less. HOME CARE  Wash your hands with soap and water before and after you touch your wound.  Only take medicine as told by your doctor.  Cover your wound only if your doctor told you to. Otherwise, leave it open to air.  Do not get your stitches wet or dirty. If they get dirty, dab them gently with a clean washcloth. Wet the washcloth with soapy water. Do not rub. Pat them dry gently.  Do not put medicine or medicated cream on your stitches unless your doctor told you to.  Do not take out your own stitches or staples. Skin adhesive strips will fall off by themselves.  Do not pick at the wound. Picking can cause an infection.  Do not miss your follow-up appointment.  If you have problems or questions, call your doctor. GET HELP RIGHT AWAY IF:   You have a temperature by mouth above 102 F (38.9 C), not controlled by medicine.  You have chills.  You have redness or pain around your stitches.  There is puffiness (swelling) around your stitches.  You notice fluid (drainage) from your stitches.  There is a bad smell coming from your wound. MAKE SURE YOU:  Understand these instructions.  Will watch your condition.  Will  get  help if you are not doing well or get worse. Document Released: 05/12/2009 Document Revised: 10/07/2011 Document Reviewed: 05/12/2009 [facility] Patient Information 2015 [place], [place]. This information is not intended to replace advice given to you by your health care provider. Make sure you discuss any questions you have with your health care provider.  Wound Care Wound care helps prevent pain and infection.  You may need a tetanus shot if:  You cannot remember when you had your last tetanus shot.  You have never had a tetanus shot.  The injury broke your skin. If you need a tetanus shot and you choose not to have one, you may get tetanus. Sickness from tetanus can be serious. HOME CARE   Only take medicine as told by your doctor.  Clean the wound daily with mild soap and water.  Change any bandages (dressings) as told by your doctor.  Put medicated cream and a bandage on the wound as told by your doctor.  Change the bandage if it gets wet, dirty, or starts to smell.  Take showers. Do not take baths, swim, or do anything that puts your wound under water.  Rest and raise (elevate) the wound until the pain and puffiness (swelling) are better.  Keep all doctor visits as told. GET HELP RIGHT AWAY IF:   Yellowish-white fluid (pus) comes from the wound.  Medicine does not lessen your pain.  There is a red streak going away from the wound.  You have a fever. MAKE SURE YOU:   Understand these instructions.  Will watch your condition.  Will get help right away if you are not doing well or get worse. Document Released: 04/23/2008 Document Revised: 10/07/2011 Document Reviewed: 11/18/2010 [facility] Patient Information 2015 [place], [place]. This information is not intended to replace advice given to you by your health care provider. Make sure you discuss any questions you have with your health care provider.

## 2014-09-03 NOTE — ED Notes (Signed)
The pt has blood coming from his ear  l he was climbing down from his deer stand when a climbing pole   Struck his rt ear.  No loc no other injuries. Clotted blood in that ear.

## 2014-09-03 NOTE — ED Notes (Signed)
Declined W/C at D/C and was escorted to lobby by RN. 

## 2014-09-03 NOTE — ED Provider Notes (Signed)
CSN: [id number]     Arrival date & time 09/03/14  1503 History  This chart was scribed for non-physician practitioner, Zacarias Pontes, PA-C,working with Ernestina Patches, MD, by Marlowe Kays, ED Scribe. This patient was seen in room TR11C/TR11C and the patient's care was started at 3:25 PM.   Chief Complaint  Patient presents with  . Ear Injury   Patient is a 64 y.o. male presenting with ear pain. The history is provided by the patient and medical records. No language interpreter was used.  Otalgia Location:  Right Behind ear:  No abnormality Quality:  Sore Severity:  Mild Onset quality:  Sudden Duration:  2 hours Timing:  Constant Progression:  Improving Chronicity:  New Context: direct blow   Relieved by:  OTC medications Worsened by:  Palpation Ineffective treatments:  None tried Associated symptoms: no abdominal pain, no ear discharge, no headaches, no hearing loss, no neck pain, no rhinorrhea, no tinnitus and no vomiting     HPI Comments:  Samuel Chambers is a 64 y.o. obese male who presents to the Emergency Department complaining of a right ear injury that he sustained approximately 1.5 hours ago. Pt reports a metal rod fell and struck the outside of his ear. He reports mild soreness and bleeding of the ear. He has taken two extra strength Tylenol with moderate relief of the pain. Movement and walking makes the pain worse. He denies LOC, fever, chills, nausea, vomiting, deceased hearing, tinnitus, HA, neck pain, CP, SOB or abdominal. Pt takes Plavix and ASA daily. Last tetanus vaccination was last year. PMHx of diverticulosis, HTN, anxiety, GERD, CAD, HLD and fibromyalgia.   Past Medical History  Diagnosis Date  . Diverticulosis   . Internal hemorrhoids   . Adenomatous colon polyp 03/2005  . Hypertension   . Anxiety   . GERD (gastroesophageal reflux disease)   . Degenerative joint disease   . CAD S/P percutaneous coronary angioplasty 2008    PCI to circumflex with a  Promus DES 2.5 mm x 8 mm  . PONV (postoperative nausea and vomiting)   . Hyperlipidemia     statin intolerant  . Obesity, Class II, BMI 35-39.9   . Fibromyalgia    Past Surgical History  Procedure Laterality Date  . Hip surgery      complete  . Heel spur surgery    . Hemorrhoid surgery    . Knee arthroscopy  03/27/2012    Procedure: ARTHROSCOPY KNEE;  Surgeon: Gearlean Alf, MD;  Location: [facility];  Service: Orthopedics;  Laterality: Left;  . Total knee arthroplasty Left 09/04/2012    Procedure: TOTAL KNEE ARTHROPLASTY;  Surgeon: Gearlean Alf, MD;  Location: [facility];  Service: Orthopedics;  Laterality: Left;  . Cholecystectomy N/A 02/18/2013    Procedure: LAPAROSCOPIC CHOLECYSTECTOMY WITH INTRAOPERATIVE CHOLANGIOGRAM;  Surgeon: Pedro Earls, MD;  Location: [facility];  Service: General;  Laterality: N/A;  . Doppler echocardiography  05/27/2002    [facility]normal EF 55-66%,  . Nm myocar perf wall motion  12/19/2011    EXERCISED FOR 8-1/2 MINUTES RECHING 10 METABOLIC EQUIVALENTS. NO EVIDENCE  OF ISCHEMIA OR INFARCTION . EF 71%  . Renal dopplers  04/08/2007    relatively normal  . Coronary angioplasty with stent placement  04/30/2007    2.5 mm Promus stent that was to 95% Circ;had 60-70% lesion to RCA  . Holter monitor  04/07/2007    sinus tachy.;   Family History  Problem Relation Age of Onset  . Breast  cancer Mother   . Diabetes Mother   . Prostate cancer Father    History  Substance Use Topics  . Smoking status: Never Smoker   . Smokeless tobacco: Never Used  . Alcohol Use: No    Review of Systems  HENT: Positive for ear pain. Negative for ear discharge, facial swelling, hearing loss, rhinorrhea and tinnitus.   Eyes: Negative for visual disturbance.  Respiratory: Negative for shortness of breath.   Cardiovascular: Negative for chest pain.  Gastrointestinal: Negative for nausea, vomiting and abdominal pain.  Musculoskeletal: Negative for neck pain and neck stiffness.    Skin: Positive for wound (R ear laceration).  Allergic/Immunologic: Negative for immunocompromised state.  Neurological: Negative for syncope, facial asymmetry, weakness, light-headedness, numbness and headaches.  Hematological: Bruises/bleeds easily (on plavix).  Psychiatric/Behavioral: Negative for confusion.   A complete 10 system review of systems was obtained and all systems are negative except as noted in the HPI and PMH.   Allergies  Codeine; Crestor; Oxycodone; Prednisone; and Warfarin and related  Home Medications   Prior to Admission medications   Medication Sig Start Date End Date Taking? Authorizing Provider  acetaminophen (TYLENOL) 500 MG tablet Take 1,000 mg by mouth every 6 (six) hours as needed. Pain    Historical Provider, MD  aspirin EC 81 MG tablet Take 81 mg by mouth daily.    Historical Provider, MD  clopidogrel (PLAVIX) 75 MG tablet Take 1 tablet (75 mg total) by mouth daily. 08/02/14   Leonie Man, MD  DULoxetine (CYMBALTA) 60 MG capsule Take 1 capsule (60 mg total) by mouth every morning. 08/02/14   Leonie Man, MD  esomeprazole (NEXIUM) 20 MG capsule Take 20 mg by mouth daily before breakfast.    Historical Provider, MD  fish oil-omega-3 fatty acids 1000 MG capsule Take 1 g by mouth daily.    Historical Provider, MD  furosemide (LASIX) 20 MG tablet Take 1 tablet (20 mg total) by mouth daily as needed for fluid. 08/02/14   Leonie Man, MD  losartan (COZAAR) 50 MG tablet Take 1 tablet (50 mg total) by mouth daily. 08/02/14   Leonie Man, MD  metoprolol succinate (TOPROL-XL) 50 MG 24 hr tablet Take 1 tablet (50 mg total) by mouth daily. 08/02/14   Leonie Man, MD  nitroGLYCERIN (NITROSTAT) 0.4 MG SL tablet Place 1 tablet (0.4 mg total) under the tongue every 5 (five) minutes as needed for chest pain. 08/02/14   Leonie Man, MD  Tadalafil (CIALIS PO) Take 1 tablet by mouth daily as needed (for intercourse).     Historical Provider, MD   Triage Vitals: BP  158/95 mmHg  Pulse 97  Temp(Src) 97.7 F (36.5 C) (Oral)  Resp 20  SpO2 100% Physical Exam  Constitutional: He is oriented to person, place, and time. Vital signs are normal. He appears well-developed and well-nourished. No distress.  Afebrile nontoxic NAD, pleasant male with VSS  HENT:  Head: Normocephalic and atraumatic. Head is without raccoon's eyes, without Battle's sign and without contusion.  Right Ear: Hearing, tympanic membrane and ear canal normal. Right ear exhibits lacerations. There is swelling and tenderness. No drainage. No mastoid tenderness. Tympanic membrane is not injected, not scarred, not perforated, not erythematous, not retracted and not bulging. No hemotympanum. No decreased hearing is noted.  Left Ear: Hearing, tympanic membrane, external ear and ear canal normal.  Ears:  Nose: Nose normal.  Mouth/Throat: Oropharynx is clear and moist and mucous membranes are normal.  Right ear with ~2-3cm laceration to the cartilage of conqueble area adjacent to anti-tragus with continued bleeding and some dried blood clots noted accumulating inferiorly near the tragus/antitragus, mildly tender to palpation behind pinna and over laceration. Skin intact in posterior pinna just behind the laceration. No bony tenderness to mastoid or surrounding skull. No bony crepitus or deformity. TM intact without erythema, bulging, retraction, or hemotympanum. No leakage of CSF noted. Ear canal without swelling or drainage, no canal deformities.  Eyes: Conjunctivae and EOM are normal. Pupils are equal, round, and reactive to light. Right eye exhibits no discharge. Left eye exhibits no discharge.  Neck: Normal range of motion. Neck supple.  Cardiovascular: Normal rate.   Pulmonary/Chest: Effort normal. No respiratory distress.  Abdominal: He exhibits no distension.  Musculoskeletal: Normal range of motion.  Neurological: He is alert and oriented to person, place, and time.  Skin: Skin is warm and  dry.  Psychiatric: He has a normal mood and affect. His behavior is normal.  Nursing note and vitals reviewed.     ED Course  LACERATION REPAIR Date/Time: 09/03/2014 4:45 PM Performed by: Shann Medal, Cola Gane STRUPP Authorized by: Corine Shelter Consent: Verbal consent obtained. Risks and benefits: risks, benefits and alternatives were discussed Consent given by: patient Patient understanding: patient states understanding of the procedure being performed Patient consent: the patient's understanding of the procedure matches consent given Patient identity confirmed: verbally with patient Body area: head/neck Location details: right ear Laceration length: 3 cm Foreign bodies: no foreign bodies Tendon involvement: none Nerve involvement: none Vascular damage: no Anesthesia: local infiltration Local anesthetic: lidocaine 2% without epinephrine Anesthetic total: 7 ml Patient sedated: no Preparation: Patient was prepped and draped in the usual sterile fashion. Irrigation solution: saline Irrigation method: syringe Amount of cleaning: extensive Debridement: none Degree of undermining: none Skin closure: 5-0 Prolene Number of sutures: 6 Technique: simple Approximation: close Approximation difficulty: simple Dressing: 4x4 sterile gauze and gauze roll Patient tolerance: Patient tolerated the procedure well with no immediate complications   (including critical care time) DIAGNOSTIC STUDIES: Oxygen Saturation is 100% on RA, normal by my interpretation.   COORDINATION OF CARE: 3:33 PM- Will have Dr. Tawnya Crook examine patient. Pt verbalizes understanding and agrees to plan.  Medications - No data to display  Labs Review Labs Reviewed  I-STAT CHEM 8, ED - Abnormal; Notable for the following:    Glucose, Bld 110 (*)    All other components within normal limits    Imaging Review No results found.   EKG Interpretation None      MDM   Final diagnoses:    Head injury  Laceration of ear region, right, initial encounter    64 y.o. male with a laceration to his R ear that extends almost through the entire ear but spared the skin posteriorly, lacerated the cartilage of the ear of the conqueble. Pt on plavix, and although he is not complaining of headache, LOC, or neurologic deficits, will obtain CT head to ensure no bleeding intracranially, and evaluate for any occult fractures. Consulted ENT, Dr. Benjamine Mola recommended closing the skin and stated no cartilage repair is necessary. Will see him in the office Monday. Will provide auricular block here and suture closed. Tetanus UTD therefore no need for updating here. Will prepare for lac repair and head CT. Will check istat chem 8 to ensure no acute blood loss anemia. Pt took two tylenols prior to arrival and declines wanting pain medication. Will reassess shortly.   5:20 PM Sutured  laceration closed with good hemostasis. Chem 8 showing stable H/H. Pt in CT now. Ben Cartner PA-C will take over care and follow up on head CT. Discussed with pt proper lac care, will give norco for pain. Discussed keeping covered. Pt not diabetic and no immunosuppression therefore doubt need for antibiotics today, plus pt will f/up in 2 days with Dr Benjamine Mola. I explained the diagnosis and have given explicit precautions to return to the ER including for any other new or worsening symptoms. The patient understands and accepts the medical plan as it's been dictated and I have answered their questions. Please see Rosanna Randy note for further documentation of care. Pt stable at this time.   I personally performed the services described in this documentation, which was scribed in my presence. The recorded information has been reviewed and is accurate.  BP 158/95 mmHg  Pulse 97  Temp(Src) 97.7 F (36.5 C) (Oral)  Resp 20  SpO2 100%  Meds ordered this encounter  Medications  . lidocaine (XYLOCAINE) 2 % (with pres) injection 200 mg     Sig:   . HYDROcodone-acetaminophen (NORCO) 5-325 MG per tablet    Sig: Take 1 tablet by mouth every 6 (six) hours as needed for severe pain.    Dispense:  10 tablet    Refill:  0    Order Specific Question:  Supervising Provider    Answer:  Johnna Acosta [address] [place], PA-C 09/03/14 1728  Ernestina Patches, MD 09/04/14 [phone]

## 2014-11-17 ENCOUNTER — Telehealth: Payer: Self-pay | Admitting: Cardiology

## 2014-11-17 NOTE — Telephone Encounter (Signed)
Pt have enrolled in Case Management Services. She just wanted Dr Ruby Cola to know this.

## 2014-11-17 NOTE — Telephone Encounter (Signed)
Thanks

## 2015-01-23 ENCOUNTER — Encounter: Payer: Self-pay | Admitting: Gastroenterology

## 2015-05-08 ENCOUNTER — Ambulatory Visit
Admission: RE | Admit: 2015-05-08 | Discharge: 2015-05-08 | Disposition: A | Discharge status: Home / Self Care | Payer: BLUE CROSS/BLUE SHIELD | Source: Ambulatory Visit | Attending: Internal Medicine | Admitting: Internal Medicine

## 2015-05-08 ENCOUNTER — Other Ambulatory Visit: Payer: Self-pay | Admitting: Internal Medicine

## 2015-05-08 DIAGNOSIS — J189 Pneumonia, unspecified organism: Secondary | ICD-10-CM

## 2015-05-19 ENCOUNTER — Other Ambulatory Visit: Payer: Self-pay | Admitting: Nurse Practitioner

## 2015-05-19 ENCOUNTER — Ambulatory Visit
Admission: RE | Admit: 2015-05-19 | Discharge: 2015-05-19 | Disposition: A | Discharge status: Home / Self Care | Payer: BLUE CROSS/BLUE SHIELD | Source: Ambulatory Visit | Attending: Nurse Practitioner | Admitting: Nurse Practitioner

## 2015-05-19 DIAGNOSIS — J151 Pneumonia due to Pseudomonas: Secondary | ICD-10-CM

## 2015-06-04 ENCOUNTER — Ambulatory Visit (INDEPENDENT_AMBULATORY_CARE_PROVIDER_SITE_OTHER): Payer: BLUE CROSS/BLUE SHIELD | Admitting: Family Medicine

## 2015-06-04 ENCOUNTER — Ambulatory Visit (HOSPITAL_COMMUNITY)
Admission: RE | Admit: 2015-06-04 | Discharge: 2015-06-04 | Disposition: A | Discharge status: Home / Self Care | Payer: BLUE CROSS/BLUE SHIELD | Source: Ambulatory Visit | Attending: Family Medicine | Admitting: Family Medicine

## 2015-06-04 VITALS — BP 140/86 | HR 107 | Temp 97.9°F | Resp 18 | Ht 72.0 in | Wt 270.8 lb

## 2015-06-04 DIAGNOSIS — R054 Cough syncope: Secondary | ICD-10-CM

## 2015-06-04 DIAGNOSIS — R059 Cough, unspecified: Secondary | ICD-10-CM

## 2015-06-04 DIAGNOSIS — R05 Cough: Secondary | ICD-10-CM | POA: Diagnosis not present

## 2015-06-04 DIAGNOSIS — I2510 Atherosclerotic heart disease of native coronary artery without angina pectoris: Secondary | ICD-10-CM | POA: Diagnosis not present

## 2015-06-04 LAB — POCT I-STAT CREATININE: Creatinine, Ser: 1 mg/dL (ref 0.61–1.24)

## 2015-06-04 MED ORDER — HYDROCOD POLST-CPM POLST ER 10-8 MG/5ML PO SUER: 5.0000 mL | Freq: Two times a day (BID) | ORAL | Status: DC | PRN

## 2015-06-04 MED ORDER — IOHEXOL 300 MG/ML  SOLN
75.0000 mL | Freq: Once | INTRAMUSCULAR | Status: AC | PRN
  Administered 2015-06-04: 75 mL via INTRAVENOUS

## 2015-06-04 NOTE — Addendum Note (Signed)
Addended by: [name] on: 06/04/2015 03:25 PM   Modules accepted: Orders

## 2015-06-04 NOTE — Patient Instructions (Signed)
GO OVER TO [facility] AND TELL THEM THAT YOU ARE THERE FOR A CT SCAN AND TO REGISTER YOU AS AN OUTPATIENT.

## 2015-06-04 NOTE — Progress Notes (Signed)
@UMFCLOGO @  This chart was scribed for Samuel Haber, MD by Thea Alken, ED Scribe. This patient was seen in room 1 and the patient's care was started at 10:58 AM.  Patient ID: Samuel Chambers MRN: [id number], DOB: 15-Apr-1951, 64 y.o. Date of Encounter: 06/04/2015, 10:58 AM  Primary Physician: No primary care provider on file.  Chief Complaint:  Chief Complaint  Patient presents with   Cough      1 week ago     HPI: 64 y.o. year old male with history below presents with gradually worsening cough. Symptoms started with a cold 8 weeks ago. [facility] and [facility] negative for the flu but was started on temaflu.  Symptoms were starting to improve with temaflu but worsened a couples days later. He [facility] and was told he had pneumonia and was prescribed an abx. To confirm he had pneumonia, he was seen by Dr. Delfina Redwood, 10/10, for a CXR which did confirm pneumonia. He was continued on the abx prescribed by the [facility] but never got better. He was seen by Dianna Rossetti, NP, 10/21, for another CXR which resulted normal and was put on another abx and received a prednisone injection without relief. He went to another facility, 10/31 was given another prednisone injection along with prednisone taper that he finished today.  Pt has had a persistent harsh cough for the past several weeks. He states he has coughed so hard that he's passed out once. Home treatments include mucinex, claritin, allergy medication and others. He has been prescribed Levaquin, hycodan, prednisone, augmenting as well as inhalers. He's also received breathing treatments without improvement. He's had intermittent episodes of diarrhea and congestion. He denies calf pain and thigh pain. Both patient and wife have become upset and impatient.   Pt works as a Dealer.    Past Medical History  Diagnosis Date   Diverticulosis    Internal hemorrhoids    Adenomatous colon  polyp 03/2005   Hypertension    Anxiety    GERD (gastroesophageal reflux disease)    Degenerative joint disease    CAD S/P percutaneous coronary angioplasty 2008    PCI to circumflex with a Promus DES 2.5 mm x 8 mm   PONV (postoperative nausea and vomiting)    Hyperlipidemia     statin intolerant   Obesity, Class II, BMI 35-39.9    Fibromyalgia      Home Meds: Prior to Admission medications   Medication Sig Start Date End Date Taking? Authorizing Provider  acetaminophen (TYLENOL) 500 MG tablet Take 1,000 mg by mouth every 6 (six) hours as needed. Pain   Yes Historical Provider, MD  aspirin EC 81 MG tablet Take 81 mg by mouth daily.   Yes Historical Provider, MD  clopidogrel (PLAVIX) 75 MG tablet Take 1 tablet (75 mg total) by mouth daily. 08/02/14  Yes Leonie Man, MD  DULoxetine (CYMBALTA) 60 MG capsule Take 1 capsule (60 mg total) by mouth every morning. 08/02/14  Yes Leonie Man, MD  esomeprazole (NEXIUM) 20 MG capsule Take 20 mg by mouth daily before breakfast.   Yes Historical Provider, MD  fish oil-omega-3 fatty acids 1000 MG capsule Take 1 g by mouth daily.   Yes Historical Provider, MD  furosemide (LASIX) 20 MG tablet Take 1 tablet (20 mg total) by mouth daily as needed for fluid. 08/02/14  Yes Leonie Man, MD  loratadine-pseudoephedrine (CLARITIN-D 24-HOUR) 10-240 MG 24  hr tablet Take 1 tablet by mouth daily.   Yes Historical Provider, MD  losartan (COZAAR) 50 MG tablet Take 1 tablet (50 mg total) by mouth daily. 08/02/14  Yes Leonie Man, MD  metoprolol succinate (TOPROL-XL) 50 MG 24 hr tablet Take 1 tablet (50 mg total) by mouth daily. 08/02/14  Yes Leonie Man, MD  nitroGLYCERIN (NITROSTAT) 0.4 MG SL tablet Place 1 tablet (0.4 mg total) under the tongue every 5 (five) minutes as needed for chest pain. 08/02/14  Yes Leonie Man, MD  Tadalafil (CIALIS PO) Take 1 tablet by mouth daily as needed (for intercourse).    Yes Historical Provider, MD   HYDROcodone-acetaminophen (NORCO) 5-325 MG per tablet Take 1 tablet by mouth every 6 (six) hours as needed for severe pain. Patient not taking: Reported on 06/04/2015 09/03/14   Mercedes Camprubi-Soms, PA-C    Allergies:  Allergies  Allergen Reactions   Codeine Other (See Comments)    Reaction unknown   Crestor [Rosuvastatin] Other (See Comments)    myalgias   Oxycodone Other (See Comments)    hallucinations   Prednisone Other (See Comments)    Reaction unknown   Warfarin And Related Other (See Comments)    Fast heart beat, went down hill, felt like he was dying    Social History   Social History   Marital Status: Married    Spouse Name: N/A   Number of Children: 1   Years of Education: N/A   Occupational History       Social History Main Topics   Smoking status: Never Smoker    Smokeless tobacco: Never Used   Alcohol Use: No   Drug Use: No   Sexual Activity: Not on file   Other Topics Concern   Not on file   Social History Narrative   Very little 0-2 drinks a week    Review of Systems: Constitutional: negative for chills, fever, night sweats, weight changes, or fatigue  HEENT: negative for vision changes, hearing loss,  rhinorrhea, ST, epistaxis, or sinus pressure Cardiovascular: negative for chest pain or palpitations Respiratory: negative for hemoptysis, wheezing, shortness of breath,. Abdominal: negative for abdominal pain, nausea, vomiting, diarrhea, or constipation Dermatological: negative for rash Neurologic: negative for headache, dizziness, or syncope All other systems reviewed and are otherwise negative with the exception to those above and in the HPI.   Physical Exam: Blood pressure 140/86, pulse 107, temperature 97.9 F (36.6 C), temperature source Oral, resp. rate 18, height 6' (1.829 m), weight 270 lb 12.8 oz (122.834 kg), SpO2 98 %., Body mass index is 36.72 kg/(m^2). General: Well developed, well nourished, in no acute  distress. Head: Normocephalic, atraumatic, eyes without discharge, sclera non-icteric, nares are without discharge. Bilateral auditory canals clear, TM's are without perforation, pearly grey and translucent with reflective cone of light bilaterally. Oral cavity moist, posterior pharynx without exudate, erythema, peritonsillar abscess, or post nasal drip.  Neck: Supple. No thyromegaly. Full ROM. No lymphadenopathy. Lungs: faint inspiratory and expiratory wheezing in right chest  No rales, or rhonchi. Breathing is unlabored.   patient has a violent cough that takes his breath away Heart: RRR with S1 S2. No murmurs, rubs, or gallops appreciated. Abdomen: Soft, non-tender, non-distended with normoactive bowel sounds. No hepatomegaly. No rebound/guarding. No obvious abdominal masses. Msk:  Strength and tone normal for age. Extremities/Skin: Warm and dry. No clubbing or cyanosis. No edema. No rashes or suspicious lesions. Neuro: Alert and oriented X 3. Moves all extremities spontaneously. Gait  is normal. CNII-XII grossly in tact. Psych:  Responds to questions appropriately with a normal affect.     ASSESSMENT AND PLAN:  64 y.o. year old male with horrible cough This chart was scribed in my presence and reviewed by me personally.    ICD-9-CM ICD-10-CM   1. Cough 786.2 R05 CT Chest W Contrast     Ambulatory referral to Pulmonology  2. Cough syncope syndrome 786.2 R05 CT Chest W Contrast     Ambulatory referral to Pulmonology     By signing my name below, I, Raven Small, attest that this documentation has been prepared under the direction and in the presence of Samuel Haber, MD.  Electronically Signed: Thea Alken, ED Scribe. 06/04/2015. 11:24 AM.   Signed, Samuel Haber, MD 06/04/2015 10:58 AM

## 2015-06-06 ENCOUNTER — Encounter: Payer: Self-pay | Admitting: Internal Medicine

## 2015-06-06 ENCOUNTER — Ambulatory Visit (INDEPENDENT_AMBULATORY_CARE_PROVIDER_SITE_OTHER): Payer: BLUE CROSS/BLUE SHIELD | Admitting: Internal Medicine

## 2015-06-06 VITALS — BP 144/82 | HR 107 | Temp 97.0°F | Ht 72.0 in | Wt 271.6 lb

## 2015-06-06 DIAGNOSIS — R05 Cough: Secondary | ICD-10-CM

## 2015-06-06 DIAGNOSIS — E669 Obesity, unspecified: Secondary | ICD-10-CM | POA: Diagnosis not present

## 2015-06-06 DIAGNOSIS — R058 Other specified cough: Secondary | ICD-10-CM | POA: Insufficient documentation

## 2015-06-06 MED ORDER — PANTOPRAZOLE SODIUM 40 MG PO TBEC: 40.0000 mg | DELAYED_RELEASE_TABLET | Freq: Every day | ORAL | Status: DC

## 2015-06-06 MED ORDER — HYDROCODONE-ACETAMINOPHEN 5-325 MG PO TABS: 1.0000 | ORAL_TABLET | Freq: Four times a day (QID) | ORAL | Status: DC | PRN

## 2015-06-06 MED ORDER — FAMOTIDINE 20 MG PO TABS: ORAL_TABLET | ORAL | Status: DC

## 2015-06-06 MED ORDER — FLUTTER DEVI: Status: DC

## 2015-06-06 MED ORDER — AMOXICILLIN-POT CLAVULANATE 875-125 MG PO TABS: 1.0000 | ORAL_TABLET | Freq: Two times a day (BID) | ORAL | Status: DC

## 2015-06-06 NOTE — Progress Notes (Signed)
Subjective:    Patient ID: Samuel Chambers, male    DOB: 28-Sep-1950,    MRN: [id number]  HPI  26 yowm never smoker never allergies/asthma worked as Freight forwarder never respiratory issues until around mid sept 2016 bad body aches/ fatigue dx as flu [facility] rx tamiflu   >>   persistent cough since then and has turned variably  productive x since Middle Oct 2016  Refractory to rx so referred 06/06/2015 to pulmonary clinic by Dr Samuel Chambers    06/06/2015 1st [facility] office visit/ Samuel Chambers   Chief Complaint  Patient presents with  . Pulmonary Consult    Referred by Dr. Robyn Chambers. Pt c/o cough off and on for the past 8 wks.  Cough is prod with green to brown sputum.  He has also had some green nasal d/c. He has had no fever, but has had chills and sweats.   intial rx with augmentin then levaquin both seemed to help some while on it / no better with saba   No obvious other patterns in day to day or daytime variabilty or assoc sob  or cp or chest tightness, subjective wheeze or overt  hb symptoms. No unusual exp hx or h/o childhood pna/ asthma or knowledge of premature birth.  Sleeping ok without nocturnal  or early am exacerbation  of respiratory  c/o's or need for noct saba. Also denies any obvious fluctuation of symptoms with weather or environmental changes or other aggravating or alleviating factors except as outlined above   Current Medications, Allergies, Complete Past Medical History, Past Surgical History, Family History, and Social History were reviewed in [employer] [employer] record.                Review of Systems  Constitutional: Positive for chills and diaphoresis. Negative for fever, activity change, appetite change and unexpected weight change.  HENT: Positive for congestion. Negative for dental problem, postnasal drip, rhinorrhea, sneezing, sore throat, trouble swallowing and voice change.   Eyes: Negative for visual disturbance.    Respiratory: Positive for cough. Negative for choking and shortness of breath.   Cardiovascular: Negative for chest pain and leg swelling.  Gastrointestinal: Negative for nausea, vomiting and abdominal pain.  Genitourinary: Negative for difficulty urinating.  Musculoskeletal: Negative for arthralgias.  Skin: Negative for rash.  Psychiatric/Behavioral: Negative for behavioral problems and confusion.       Objective:   Physical Exam  amb obese wm with harsh honking upper airway cough minimally congested sounding   Wt Readings from Last 3 Encounters:  06/06/15 271 lb 9.6 oz (123.197 kg)  06/04/15 270 lb 12.8 oz (122.834 kg)  08/02/14 271 lb (122.925 kg)    Vital signs reviewed  HEENT: nl dentition, turbinates, and oropharynx. Nl external ear canals without cough reflex   NECK :  without JVD/Nodes/TM/ nl carotid upstrokes bilaterally   LUNGS: no acc muscle use, clear to A and P bilaterally without cough on insp or exp maneuvers   CV:  RRR  no s3 or murmur or increase in P2, no edema   ABD:  soft and nontender with nl excursion in the supine position. No bruits or organomegaly, bowel sounds nl  MS:  warm without deformities, calf tenderness, cyanosis or clubbing  SKIN: warm and dry without lesions    NEURO:  alert, approp, no deficits   cxr 05/08/15 ? pna > neg  05/19/15 and CT neg 06/02/15  Assessment & Plan:

## 2015-06-06 NOTE — Patient Instructions (Signed)
Augmentin 875 mg take one pill twice daily  X 10 days - take at breakfast and supper with large glass of water.  It would help reduce the usual side effects (diarrhea and yeast infections) if you ate cultured yogurt at lunch.   For cough > mucinex dm 1200 mg every 12 hours and use the flutter valve as much as possible and supplement with norco every 4 hours to stop all throat clearing and cough   Pantoprazole (protonix) 40 mg   Take  30-60 min before first meal of the day and Pepcid (famotidine)  20 mg one @  bedtime until return to office - this is the best way to tell whether stomach acid is contributing to your problem.    GERD (REFLUX)  is an extremely common cause of respiratory symptoms just like yours , many times with no obvious heartburn at all.    It can be treated with medication, but also with lifestyle changes including elevation of the head of your bed (ideally with 6 inch  bed blocks),  Smoking cessation, avoidance of late meals, excessive alcohol, and avoid fatty foods, chocolate, peppermint, colas, red wine, and acidic juices such as orange juice.  NO MINT OR MENTHOL PRODUCTS SO NO COUGH DROPS  USE SUGARLESS CANDY INSTEAD (Jolley ranchers or Stover's or Life Savers) or even ice chips will also do - the key is to swallow to prevent all throat clearing. NO OIL BASED VITAMINS - use powdered substitutes.  Nasal saline spray well help your sinuses better than anything else     Call if not better by first of next week  and ask [facility] to schedule sinus CT  Call [phone]

## 2015-06-07 ENCOUNTER — Encounter: Payer: Self-pay | Admitting: Internal Medicine

## 2015-06-07 NOTE — Assessment & Plan Note (Signed)
The most common causes of chronic cough in immunocompetent adults include the following: upper airway cough syndrome (UACS), previously referred to as postnasal drip syndrome (PNDS), which is caused by variety of rhinosinus conditions; (2) asthma; (3) GERD; (4) chronic bronchitis from cigarette smoking or other inhaled environmental irritants; (5) nonasthmatic eosinophilic bronchitis; and (6) bronchiectasis.   These conditions, singly or in combination, have accounted for up to 94% of the causes of chronic cough in prospective studies.   Other conditions have constituted no >6% of the causes in prospective studies These have included bronchogenic carcinoma, chronic interstitial pneumonia, sarcoidosis, left ventricular failure, ACEI-induced cough, and aspiration from a condition associated with pharyngeal dysfunction.    Chronic cough is often simultaneously caused by more than one condition. A single cause has been found from 38 to 82% of the time, multiple causes from 18 to 62%. Multiply caused cough has been the result of three diseases up to 42% of the time.       Based on hx and exam, this is most likely:  Classic Upper airway cough syndrome, so named because it's frequently impossible to sort out how much is  CR/sinusitis with freq throat clearing (which can be related to primary GERD)   vs  causing  secondary (" extra esophageal")  GERD from wide swings in gastric pressure that occur with throat clearing, often  promoting self use of mint and menthol lozenges that reduce the lower esophageal sphincter tone and exacerbate the problem further in a cyclical fashion.   These are the same pts (now being labeled as having "irritable larynx syndrome" by some cough centers) who not infrequently have a history of having failed to tolerate ace inhibitors,  dry powder inhalers or biphosphonates or report having atypical reflux symptoms that don't respond to standard doses of PPI , and are easily confused as  having aecopd or asthma flares by even experienced allergists/ pulmonologists.   The first step is to maximize acid suppression and eliminate cyclical coughing with flutter valve  then sinus ct if not improving p 10 more days augmentin  I had an extended discussion with the patient reviewing all relevant studies completed to date and  lasting 35 min  1) Explained: The standardized cough guidelines published in Chest by Lissa Morales in 2006 are still the best available and consist of a multiple step process (up to 12!) , not a single office visit,  and are intended  to address this problem logically,  with an alogrithm dependent on response to empiric treatment at  each progressive step  to determine a specific diagnosis with  minimal addtional testing needed. Therefore if adherence is an issue or can't be accurately verified,  it's very unlikely the standard evaluation and treatment will be successful here.    Furthermore, response to therapy (other than acute cough suppression, which should only be used short term with avoidance of narcotic containing cough syrups if possible), can be a gradual process for which the patient may perceive immediate benefit.  Unlike going to an eye doctor where the best perscription is almost always the first one and is immediately effective, this is almost never the case in the management of chronic cough syndromes. Therefore the patient needs to commit up front to consistently adhere to recommendations  for up to 6 weeks of therapy directed at the likely underlying problem(s) before the response can be reasonably evaluated.     2) Each maintenance medication was reviewed in detail including most importantly  the difference between maintenance and prns and under what circumstances the prns are to be triggered using an action plan format that is not reflected in the computer generated alphabetically organized AVS.    Please see instructions for details which were  reviewed in writing and the patient given a copy highlighting the part that I personally wrote and discussed at today's ov.   See instructions for specific recommendations which were reviewed directly with the patient who was given a copy with highlighter outlining the key components.

## 2015-06-07 NOTE — Assessment & Plan Note (Signed)
Body mass index is 36.83 kg/(m^2).  No results found for: TSH   Contributing to gerd tendency/ doe/reviewed the need and the process to achieve and maintain neg calorie balance > defer f/u primary care including intermittently monitoring thyroid status

## 2015-06-13 ENCOUNTER — Encounter: Payer: Self-pay | Admitting: Physician Assistant

## 2015-06-13 ENCOUNTER — Ambulatory Visit (INDEPENDENT_AMBULATORY_CARE_PROVIDER_SITE_OTHER): Payer: BLUE CROSS/BLUE SHIELD | Admitting: Physician Assistant

## 2015-06-13 VITALS — BP 152/96 | HR 88 | Ht 72.0 in | Wt 271.0 lb

## 2015-06-13 DIAGNOSIS — E785 Hyperlipidemia, unspecified: Secondary | ICD-10-CM

## 2015-06-13 DIAGNOSIS — R079 Chest pain, unspecified: Secondary | ICD-10-CM

## 2015-06-13 DIAGNOSIS — Z9861 Coronary angioplasty status: Secondary | ICD-10-CM

## 2015-06-13 DIAGNOSIS — I10 Essential (primary) hypertension: Secondary | ICD-10-CM | POA: Diagnosis not present

## 2015-06-13 DIAGNOSIS — I2510 Atherosclerotic heart disease of native coronary artery without angina pectoris: Secondary | ICD-10-CM

## 2015-06-13 DIAGNOSIS — E669 Obesity, unspecified: Secondary | ICD-10-CM

## 2015-06-13 MED ORDER — LOSARTAN POTASSIUM 100 MG PO TABS: 100.0000 mg | ORAL_TABLET | Freq: Every day | ORAL | Status: DC

## 2015-06-13 NOTE — Patient Instructions (Signed)
Tarri Fuller, PA-C, has recommended making the following medication changes: INCREASE Losartan to 100 mg. You make take 2 tablets daily until your current prescription runs out. A new prescription has been sent to your pharmacy electronically with updated dosages and instructions.  Your physician recommends that you schedule a follow-up appointment in 3 months with Dr Ellyn Hack.

## 2015-06-13 NOTE — Progress Notes (Signed)
Patient ID: Samuel Chambers, male   DOB: [birth year], 64 y.o.   MRN: [id number]     Date:  06/13/2015   ID:  Samuel Chambers, DOB 11-25-1950, MRN [id number]  PCP:  No PCP Per Patient  Primary Cardiologist: Ellyn Hack   Chief Complaint  Patient presents with  . Follow-up    pt states sometimes when getting up he gets light headed, no swelling in legs, pt states that sometimes he can just be sitting still and just break out in a cold sweat   . Chest Pain    some  . Shortness of Breath    had some but its gotten better     History of Present Illness: Samuel Chambers is a 64 y.o. obese male with history of coronary artery disease, hypertension , hyperlipidemia, obesity , fibromyalgia. He had PCI to the circumflex vessel with a Promus DES 2.5 mm x 8 mm in 2008.   An abnormal Myoview in 2013.   He has not had an echo since 2003.   He is here for follow-up evaluation.   Patient had a difficult last 8 weeks. Started with being treated for the flu and then an embolic for pneumonia min ultimately was seen by Dr. Melvyn Novas  Diagnosed him with upper airway cough syndrome. He's had several doses of prednisone including prednisone taper.   Was also started on Protonix and given a flutter valve. This past weekend he reports having chest pressure feeling weak short of breath and nonrelated diaphoresis. He's also had some abdominal cramping which may be related to the persistent coughing that he has had. 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 He's lifted as much as 50 pounds without any chest pressure. He's had none since this past weekend. No nausea, vomiting, fever.   He does not exercise and has had problems with his left knee since its replacement.  The patient currently denies orthopnea, dizziness, PND, cough, congestion, abdominal pain, hematochezia, melena, lower extremity edema, claudication.  Wt Readings from Last 3 Encounters:  06/13/15 271 lb (122.925 kg)  06/06/15 271 lb 9.6 oz  (123.197 kg)  06/04/15 270 lb 12.8 oz (122.834 kg)     Past Medical History  Diagnosis Date  . Diverticulosis   . Internal hemorrhoids   . Adenomatous colon polyp 03/2005  . Hypertension   . Anxiety   . GERD (gastroesophageal reflux disease)   . Degenerative joint disease   . CAD S/P percutaneous coronary angioplasty 2008    PCI to circumflex with a Promus DES 2.5 mm x 8 mm  . PONV (postoperative nausea and vomiting)   . Hyperlipidemia     statin intolerant  . Obesity, Class II, BMI 35-39.9   . Fibromyalgia     Current Outpatient Prescriptions  Medication Sig Dispense Refill  . albuterol (PROAIR HFA) 108 (90 BASE) MCG/ACT inhaler Inhale 2 puffs into the lungs every 6 (six) hours as needed for wheezing or shortness of breath.    Marland Kitchen amoxicillin-clavulanate (AUGMENTIN) 875-125 MG tablet Take 1 tablet by mouth 2 (two) times daily. 20 tablet 0  . aspirin EC 81 MG tablet Take 81 mg by mouth daily.    . benzonatate (TESSALON) 200 MG capsule Take 200 mg by mouth 3 (three) times daily as needed for cough.    . clopidogrel (PLAVIX) 75 MG tablet Take 1 tablet (75 mg total) by mouth daily. 90 tablet 3  . DULoxetine (CYMBALTA) 60 MG capsule  Take 1 capsule (60 mg total) by mouth every morning. 90 capsule 3  . esomeprazole (NEXIUM) 20 MG capsule Take 20 mg by mouth daily before breakfast.    . famotidine (PEPCID) 20 MG tablet One at bedtime 30 tablet 2  . furosemide (LASIX) 20 MG tablet Take 1 tablet (20 mg total) by mouth daily as needed for fluid. 90 tablet 3  . HYDROcodone-acetaminophen (NORCO) 5-325 MG tablet Take 1 tablet by mouth every 6 (six) hours as needed for moderate pain. 30 tablet 0  . loratadine (CLARITIN) 10 MG tablet Take 10 mg by mouth daily.    Marland Kitchen losartan (COZAAR) 100 MG tablet Take 1 tablet (100 mg total) by mouth daily. 30 tablet 11  . metoprolol succinate (TOPROL-XL) 50 MG 24 hr tablet Take 1 tablet (50 mg total) by mouth daily. 90 tablet 3  . naproxen sodium (ANAPROX) 220 MG  tablet Take 220 mg by mouth 2 (two) times daily as needed.    . nitroGLYCERIN (NITROSTAT) 0.4 MG SL tablet Place 1 tablet (0.4 mg total) under the tongue every 5 (five) minutes as needed for chest pain. 25 tablet 3  . pantoprazole (PROTONIX) 40 MG tablet Take 1 tablet (40 mg total) by mouth daily. Take 30-60 min before first meal of the day 30 tablet 2  . Respiratory Therapy Supplies (FLUTTER) DEVI Use as directed 1 each 0   No current facility-administered medications for this visit.    Allergies:    Allergies  Allergen Reactions  . Codeine Other (See Comments)    Reaction unknown  . Crestor [Rosuvastatin] Other (See Comments)    myalgias  . Oxycodone Other (See Comments)    hallucinations  . Prednisone Other (See Comments)    Increased BP  . Warfarin And Related Other (See Comments)    Fast heart beat, went down hill, felt like he was dying    Social History:  The patient  reports that he has never smoked. He has never used smokeless tobacco. He reports that he does not drink alcohol or use illicit drugs.   Family history:   Family History  Problem Relation Age of Onset  . Breast cancer Mother   . Diabetes Mother   . Prostate cancer Father     ROS:  Please see the history of present illness.  All other systems reviewed and negative.   PHYSICAL EXAM: VS:  BP 152/96 mmHg  Pulse 88  Ht 6' (1.829 m)  Wt 271 lb (122.925 kg)  BMI 36.75 kg/m2  obese, well developed, in no acute distress HEENT: Pupils are equal round react to light accommodation extraocular movements are intact.  Neck: no JVDNo cervical lymphadenopathy. Cardiac: Regular rate and rhythm without murmurs rubs or gallops. Lungs:  clear to auscultation bilaterally, no wheezing, rhonchi or rales Abd: soft, nontender, positive bowel sounds all quadrants, no hepatosplenomegaly Ext: no lower extremity edema.  2+ radial and dorsalis pedis pulses. Skin: warm and dry Neuro:  Grossly normal  EKG:   Normal sinus  rhythm. Left leftward axis deviation. Rate 88 bpm. No changes compared to prior EKG.  ASSESSMENT AND PLAN:  Problem List Items Addressed This Visit    Obesity (BMI 30-39.9) (Chronic)   Hyperlipidemia with target LDL less than 70; statin intolerant (Chronic)   Relevant Medications   losartan (COZAAR) 100 MG tablet   Essential hypertension (Chronic)   Relevant Medications   losartan (COZAAR) 100 MG tablet   CHEST PAIN - Primary (Chronic)   Relevant Orders  EKG 12-Lead   CAD S/P percutaneous coronary angioplasty (Chronic)   Relevant Medications   losartan (COZAAR) 100 MG tablet      essential hypertension: Blood pressures been chronically elevated for the last several weeks. At home is running in the 140s over 70s 80s. Have increased his Cozaar to 100 mg daily.    chest pain:  I think this is related to his cough syndrome. He was able to work for the last 2 days lifting as much as 50 pounds without any chest pressure or tightness.   He finally feels that he is actually getting better. He has any other exertional episodes should schedule for stress test. On follow-up 3 months.   coronary artery disease   PCI to circumflex 2008 negative nuclear 2013.   Obesity   we discussed an appropriate low carb diet. Also recommended nutritional referral to a registered dietitian she is declined.   Hyperlipidemia  Recheck lipids at next OV.

## 2015-06-19 ENCOUNTER — Encounter (HOSPITAL_COMMUNITY): Payer: Self-pay | Admitting: Emergency Medicine

## 2015-06-19 ENCOUNTER — Emergency Department (HOSPITAL_COMMUNITY): Payer: BLUE CROSS/BLUE SHIELD

## 2015-06-19 ENCOUNTER — Ambulatory Visit (EMERGENCY_DEPARTMENT_HOSPITAL): Payer: BLUE CROSS/BLUE SHIELD

## 2015-06-19 ENCOUNTER — Emergency Department (HOSPITAL_COMMUNITY)
Admission: EM | Admit: 2015-06-19 | Discharge: 2015-06-19 | Disposition: A | Discharge status: Home / Self Care | Payer: BLUE CROSS/BLUE SHIELD | Attending: Emergency Medicine | Admitting: Emergency Medicine

## 2015-06-19 DIAGNOSIS — Z8739 Personal history of other diseases of the musculoskeletal system and connective tissue: Secondary | ICD-10-CM | POA: Diagnosis not present

## 2015-06-19 DIAGNOSIS — Z79899 Other long term (current) drug therapy: Secondary | ICD-10-CM | POA: Insufficient documentation

## 2015-06-19 DIAGNOSIS — M79661 Pain in right lower leg: Secondary | ICD-10-CM | POA: Insufficient documentation

## 2015-06-19 DIAGNOSIS — I10 Essential (primary) hypertension: Secondary | ICD-10-CM | POA: Diagnosis not present

## 2015-06-19 DIAGNOSIS — Z7982 Long term (current) use of aspirin: Secondary | ICD-10-CM | POA: Diagnosis not present

## 2015-06-19 DIAGNOSIS — Z9861 Coronary angioplasty status: Secondary | ICD-10-CM | POA: Diagnosis not present

## 2015-06-19 DIAGNOSIS — F419 Anxiety disorder, unspecified: Secondary | ICD-10-CM | POA: Diagnosis not present

## 2015-06-19 DIAGNOSIS — E669 Obesity, unspecified: Secondary | ICD-10-CM | POA: Diagnosis not present

## 2015-06-19 DIAGNOSIS — K219 Gastro-esophageal reflux disease without esophagitis: Secondary | ICD-10-CM | POA: Diagnosis not present

## 2015-06-19 DIAGNOSIS — Z7901 Long term (current) use of anticoagulants: Secondary | ICD-10-CM | POA: Diagnosis not present

## 2015-06-19 DIAGNOSIS — M79609 Pain in unspecified limb: Secondary | ICD-10-CM

## 2015-06-19 DIAGNOSIS — I2510 Atherosclerotic heart disease of native coronary artery without angina pectoris: Secondary | ICD-10-CM | POA: Diagnosis not present

## 2015-06-19 DIAGNOSIS — M79604 Pain in right leg: Secondary | ICD-10-CM

## 2015-06-19 DIAGNOSIS — Z86010 Personal history of colonic polyps: Secondary | ICD-10-CM | POA: Insufficient documentation

## 2015-06-19 MED ORDER — IBUPROFEN 800 MG PO TABS: 800.0000 mg | ORAL_TABLET | Freq: Three times a day (TID) | ORAL | Status: DC | PRN

## 2015-06-19 MED ORDER — HYDROMORPHONE HCL 1 MG/ML IJ SOLN
1.0000 mg | Freq: Once | INTRAMUSCULAR | Status: AC
  Administered 2015-06-19: 1 mg via INTRAMUSCULAR
  Filled 2015-06-19: qty 1

## 2015-06-19 MED ORDER — OXYCODONE-ACETAMINOPHEN 10-325 MG PO TABS: 1.0000 | ORAL_TABLET | ORAL | Status: DC | PRN

## 2015-06-19 MED ORDER — MORPHINE SULFATE (PF) 4 MG/ML IV SOLN
4.0000 mg | Freq: Once | INTRAVENOUS | Status: AC
  Administered 2015-06-19: 4 mg via INTRAMUSCULAR
  Filled 2015-06-19: qty 1

## 2015-06-19 NOTE — Discharge Instructions (Signed)
Follow-up with your orthopedic doctor.  Your x-rays showed Some arthritis in your knee.  No other significant findings.  Ultrasound did not show any blood clots

## 2015-06-19 NOTE — ED Notes (Signed)
Pt with chief complaint of right leg pain for past two days. Pt with no history of recent injuries or dvts.

## 2015-06-19 NOTE — Progress Notes (Signed)
VASCULAR LAB PRELIMINARY  PRELIMINARY  PRELIMINARY  PRELIMINARY  Right lower extremity venous duplex completed.    Preliminary report:  There is no DVT or SVT noted in the right lower extremity.   Aunesti Pellegrino, RVT 06/19/2015, 9:25 AM

## 2015-06-22 NOTE — ED Provider Notes (Signed)
CSN: [id number]     Arrival date & time 06/19/15  [id number] History   First MD Initiated Contact with Patient 06/19/15 0604     Chief Complaint  Patient presents with  . Leg Pain     (Consider location/radiation/quality/duration/timing/severity/associated sxs/prior Treatment) HPI Patient presents to the emergency department with right leg pain that started 2 days ago.  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  Patient states that he does not recall any injury at work.  He states that movement and palpation make the pain worse.  Patient states nothing seems make his condition, better that he tried at home.  Patient denies chest pain, shortness of breath, weakness, dizziness, headache, blurred vision, back pain, neck pain, fever, cough, rhinitis, sore throat, abdominal pain, rash, dysuria, near syncope or syncope.  The patient states that he did not try any medications at home is no history of blood clots in the past.  The history of cancer Past Medical History  Diagnosis Date  . Diverticulosis   . Internal hemorrhoids   . Adenomatous colon polyp 03/2005  . Hypertension   . Anxiety   . GERD (gastroesophageal reflux disease)   . Degenerative joint disease   . CAD S/P percutaneous coronary angioplasty 2008    PCI to circumflex with a Promus DES 2.5 mm x 8 mm  . PONV (postoperative nausea and vomiting)   . Hyperlipidemia     statin intolerant  . Obesity, Class II, BMI 35-39.9   . Fibromyalgia    Past Surgical History  Procedure Laterality Date  . Hip surgery      complete  . Heel spur surgery    . Hemorrhoid surgery    . Knee arthroscopy  03/27/2012    Procedure: ARTHROSCOPY KNEE;  Surgeon: Gearlean Alf, MD;  Location: [facility];  Service: Orthopedics;  Laterality: Left;  . Total knee arthroplasty Left 09/04/2012    Procedure: TOTAL KNEE ARTHROPLASTY;  Surgeon: Gearlean Alf, MD;  Location: [facility];  Service:  Orthopedics;  Laterality: Left;  . Cholecystectomy N/A 02/18/2013    Procedure: LAPAROSCOPIC CHOLECYSTECTOMY WITH INTRAOPERATIVE CHOLANGIOGRAM;  Surgeon: Pedro Earls, MD;  Location: [facility];  Service: General;  Laterality: N/A;  . Doppler echocardiography  05/27/2002    [facility]normal EF 55-66%,  . Nm myocar perf wall motion  12/19/2011    EXERCISED FOR 8-1/2 MINUTES RECHING 10 METABOLIC EQUIVALENTS. NO EVIDENCE  OF ISCHEMIA OR INFARCTION . EF 71%  . Renal dopplers  04/08/2007    relatively normal  . Coronary angioplasty with stent placement  04/30/2007    2.5 mm Promus stent that was to 95% Circ;had 60-70% lesion to RCA  . Holter monitor  04/07/2007    sinus tachy.;   Family History  Problem Relation Age of Onset  . Breast cancer Mother   . Diabetes Mother   . Prostate cancer Father    Social History  Substance Use Topics  . Smoking status: Never Smoker   . Smokeless tobacco: Never Used  . Alcohol Use: No    Review of Systems  All other systems negative except as documented in the HPI. All pertinent positives and negatives as reviewed in the HPI.  Allergies  Crestor; Oxycodone; Prednisone; and Warfarin and related  Home Medications   Prior to Admission medications   Medication Sig Start Date End Date Taking? Authorizing Provider  aspirin EC 81 MG  tablet Take 81 mg by mouth daily.   Yes Historical Provider, MD  benzonatate (TESSALON) 200 MG capsule Take 200 mg by mouth 3 (three) times daily as needed for cough.   Yes Historical Provider, MD  clopidogrel (PLAVIX) 75 MG tablet Take 1 tablet (75 mg total) by mouth daily. 08/02/14  Yes Leonie Man, MD  DULoxetine (CYMBALTA) 60 MG capsule Take 1 capsule (60 mg total) by mouth every morning. 08/02/14  Yes Leonie Man, MD  esomeprazole (NEXIUM) 20 MG capsule Take 20 mg by mouth daily before breakfast.   Yes Historical Provider, MD  furosemide (LASIX) 20 MG tablet Take 1 tablet (20 mg total) by mouth daily as needed for  fluid. 08/02/14  Yes Leonie Man, MD  HYDROcodone-acetaminophen (NORCO) 5-325 MG tablet Take 1 tablet by mouth every 6 (six) hours as needed for moderate pain. 06/06/15  Yes Tanda Rockers, MD  losartan (COZAAR) 100 MG tablet Take 1 tablet (100 mg total) by mouth daily. 06/13/15  Yes Brett Canales, PA-C  metoprolol succinate (TOPROL-XL) 50 MG 24 hr tablet Take 1 tablet (50 mg total) by mouth daily. 08/02/14  Yes Leonie Man, MD  naproxen sodium (ANAPROX) 220 MG tablet Take 220 mg by mouth 2 (two) times daily as needed.   Yes Historical Provider, MD  pantoprazole (PROTONIX) 40 MG tablet Take 1 tablet (40 mg total) by mouth daily. Take 30-60 min before first meal of the day 06/06/15  Yes Tanda Rockers, MD  amoxicillin-clavulanate (AUGMENTIN) 875-125 MG tablet Take 1 tablet by mouth 2 (two) times daily. Patient not taking: Reported on 06/19/2015 06/06/15   Tanda Rockers, MD  famotidine (PEPCID) 20 MG tablet One at bedtime Patient not taking: Reported on 06/19/2015 06/06/15   Tanda Rockers, MD  ibuprofen (ADVIL,MOTRIN) 800 MG tablet Take 1 tablet (800 mg total) by mouth every 8 (eight) hours as needed. 06/19/15   Dalia Heading, PA-C  nitroGLYCERIN (NITROSTAT) 0.4 MG SL tablet Place 1 tablet (0.4 mg total) under the tongue every 5 (five) minutes as needed for chest pain. 08/02/14   Leonie Man, MD  oxyCODONE-acetaminophen (PERCOCET) 10-325 MG tablet Take 1 tablet by mouth every 4 (four) hours as needed for pain. 06/19/15   Dalia Heading, PA-C  Respiratory Therapy Supplies (FLUTTER) DEVI Use as directed 06/06/15   Tanda Rockers, MD   BP 129/106 mmHg  Pulse 69  Temp(Src) 97.8 F (36.6 C) (Oral)  Resp 14  Ht 6' (1.829 m)  Wt 122.471 kg  BMI 36.61 kg/m2  SpO2 96% Physical Exam  Constitutional: He is oriented to person, place, and time. He appears well-developed and well-nourished. No distress.  HENT:  Head: Normocephalic and atraumatic.  Mouth/Throat: Oropharynx is clear and moist.    Eyes: Pupils are equal, round, and reactive to light.  Neck: Normal range of motion. Neck supple.  Cardiovascular: Normal rate, regular rhythm and normal heart sounds.  Exam reveals no gallop and no friction rub.   No murmur heard. Pulmonary/Chest: Effort normal and breath sounds normal. No respiratory distress. He has no wheezes.  Abdominal: Soft. Bowel sounds are normal. He exhibits no distension. There is no tenderness.  Musculoskeletal:       Legs: Neurological: He is alert and oriented to person, place, and time. He exhibits normal muscle tone. Coordination normal.  Skin: Skin is warm and dry. No rash noted. No erythema.  Psychiatric: He has a normal mood and affect. His behavior is normal.  Nursing note and vitals reviewed.   ED Course  Procedures (including critical care time) Labs Review Labs Reviewed - No data to display  Imaging Review No results found. I have personally reviewed and evaluated these images and lab results as part of my medical decision-making.   EKG Interpretation None      MDM   Final diagnoses:  Pain of right lower extremity    Patient is discharged home and advised to follow-up with his orthopedist.  Patient was placed in a knee immobilizer and given crutches.    I advised the patient that he needs to keep a close eye on his condition and return here for any worsening in his condition.  Advised him this could be an orthopedic issue or a vascular issue that he needs to keep close monitoring on his situation.  Patient agrees the plan and all questions were answered  Dalia Heading, PA-C 06/25/15 [place], DO 06/26/15 [id number]

## 2015-06-26 ENCOUNTER — Ambulatory Visit (INDEPENDENT_AMBULATORY_CARE_PROVIDER_SITE_OTHER): Payer: BLUE CROSS/BLUE SHIELD | Admitting: Family Medicine

## 2015-06-26 VITALS — BP 154/74 | HR 87 | Temp 98.7°F | Resp 16 | Ht 72.0 in | Wt 273.0 lb

## 2015-06-26 DIAGNOSIS — M129 Arthropathy, unspecified: Secondary | ICD-10-CM | POA: Diagnosis not present

## 2015-06-26 DIAGNOSIS — M1711 Unilateral primary osteoarthritis, right knee: Secondary | ICD-10-CM

## 2015-06-26 DIAGNOSIS — H6501 Acute serous otitis media, right ear: Secondary | ICD-10-CM | POA: Diagnosis not present

## 2015-06-26 MED ORDER — METHYLPREDNISOLONE ACETATE 80 MG/ML IJ SUSP
80.0000 mg | Freq: Once | INTRAMUSCULAR | Status: AC
  Administered 2015-06-26: 80 mg via INTRAMUSCULAR

## 2015-06-26 NOTE — Patient Instructions (Signed)
Try using the Afrin nasal spray each night for 3 or 4 nights to open up the right ear.  Please let me know if the knee problem persists by Wednesday

## 2015-06-26 NOTE — Progress Notes (Signed)
@[facility] @  This chart was scribed for Samuel Haber, MD by Thea Alken, ED Scribe. This patient was seen in room 11 and the patient's care was started at 2:35 PM.  Patient ID: Samuel Chambers MRN: [id number], DOB: 01-Jul-1951, 64 y.o. Date of Encounter: 06/26/2015, 2:35 PM  Primary Physician: No PCP Per Patient  Chief Complaint:  Chief Complaint  Patient presents with   Ear Problem    rt. ear, pressure, x 1 week    Leg Pain    right    HPI: 64 y.o. year old male with history below present for a follow up. Pt was seen about 2 weeks ago for a violent harsh cough that he had for 8 weeks. He was seen at multiple other facilities, had CXR's and multiple treatments for cough prior to that visit. He was referred to pulmonology and was seen by Dr. Melvyn Novas 2 days and was diagnosed with Upper airway cough syndrome and plan to maximize acid suppression, see note from 11/08.  Since last visit pt cough has completely improved and is doing better.   Pt has had gradually worsen right leg pain that began 8 days ago. No injury or fall.  He was seen in the ED 64 days ago for right leg pain. He had normal ultrasound and a knee XR that showed degenerative changes of the knee. Pt was treated with ibuprofen and oxycodone and advised to follow up with ortho. Since that visit he had persistent pain, exacerbated with certain movements. He has been ambulating a cane.    Pt has also has right ear pain. At visit with Dr. Melvyn Novas he was diagnosed with a sinus infection and was prescribed Augmentin.   Pt works as a Dealer.   Past Medical History  Diagnosis Date   Diverticulosis    Internal hemorrhoids    Adenomatous colon polyp 03/2005   Hypertension    Anxiety    GERD (gastroesophageal reflux disease)    Degenerative joint disease    CAD S/P percutaneous coronary angioplasty 2008    PCI to circumflex with a Promus DES 2.5 mm x 8 mm   PONV (postoperative nausea and vomiting)    Hyperlipidemia    statin intolerant   Obesity, Class II, BMI 35-39.9    Fibromyalgia      Home Meds: Prior to Admission medications   Medication Sig Start Date End Date Taking? Authorizing Provider  aspirin EC 81 MG tablet Take 81 mg by mouth daily.   Yes Historical Provider, MD  clopidogrel (PLAVIX) 75 MG tablet Take 1 tablet (75 mg total) by mouth daily. 08/02/14  Yes Leonie Man, MD  DULoxetine (CYMBALTA) 60 MG capsule Take 1 capsule (60 mg total) by mouth every morning. 08/02/14  Yes Leonie Man, MD  esomeprazole (NEXIUM) 20 MG capsule Take 20 mg by mouth daily before breakfast.   Yes Historical Provider, MD  furosemide (LASIX) 20 MG tablet Take 1 tablet (20 mg total) by mouth daily as needed for fluid. 08/02/14  Yes Leonie Man, MD  HYDROcodone-acetaminophen (NORCO) 5-325 MG tablet Take 1 tablet by mouth every 6 (six) hours as needed for moderate pain. 06/06/15  Yes Tanda Rockers, MD  losartan (COZAAR) 100 MG tablet Take 1 tablet (100 mg total) by mouth daily. Patient taking differently: Take 50 mg by mouth daily.  06/13/15  Yes Brett Canales, PA-C  metoprolol succinate (TOPROL-XL) 50 MG 24 hr tablet Take 1 tablet (50 mg total) by mouth daily. 08/02/14  Yes Leonie Man, MD  amoxicillin-clavulanate (AUGMENTIN) 875-125 MG tablet Take 1 tablet by mouth 2 (two) times daily. Patient not taking: Reported on 06/19/2015 06/06/15   Tanda Rockers, MD    Allergies:  Allergies  Allergen Reactions   Crestor [Rosuvastatin] Other (See Comments)    myalgias   Oxycodone Other (See Comments)    Hallucinations-ok in small doses   Prednisone Other (See Comments)    Increased BP   Warfarin And Related Other (See Comments)    Fast heart beat, went down hill, felt like he was dying    Social History   Social History   Marital Status: Married    Spouse Name: N/A   Number of Children: 1   Years of Education: N/A   Occupational History       Social History Main Topics   Smoking status:  Never Smoker    Smokeless tobacco: Never Used   Alcohol Use: No   Drug Use: No   Sexual Activity: Not on file   Other Topics Concern   Not on file   Social History Narrative   Very little 0-2 drinks a week     Review of Systems: Constitutional: negative for chills, fever, night sweats, weight changes, or fatigue  HEENT: negative for vision changes, hearing loss, congestion, rhinorrhea, ST, epistaxis, or sinus pressure Cardiovascular: negative for chest pain or palpitations Respiratory: negative for hemoptysis, wheezing, shortness of breath, or cough Abdominal: negative for abdominal pain, nausea, vomiting, diarrhea, or constipation Dermatological: negative for rash Neurologic: negative for headache, dizziness, or syncope All other systems reviewed and are otherwise negative with the exception to those above and in the HPI.   Physical Exam: Blood pressure 154/74, pulse 87, temperature 98.7 F (37.1 C), temperature source Oral, resp. rate 16, height 6' (1.829 m), weight 273 lb (123.832 kg), SpO2 98 %., Body mass index is 37.02 kg/(m^2). General: Well developed, well nourished, in no acute distress. Head: Normocephalic, atraumatic, eyes without discharge, sclera non-icteric, nares are without discharge.Oral cavity moist, posterior pharynx without exudate, erythema, peritonsillar abscess, or post nasal drip. right ear has hazy fluid bubbles  Neck: Supple. No thyromegaly. Full ROM. No lymphadenopathy. Lungs: Clear bilaterally to auscultation without wheezes, rales, or rhonchi. Breathing is unlabored. Heart: RRR with S1 S2. No murmurs, rubs, or gallops appreciated. Abdomen: Soft, non-tender, non-distended with normoactive bowel sounds. No hepatomegaly. No rebound/guarding. No obvious abdominal masses. Msk:  Strength and tone normal for age. Knee ROM is normal ligamentous testing is normal. No local tenderness. No effusion.  Extremities/Skin: Warm and dry. No clubbing or cyanosis. No  edema. No rashes or suspicious lesions. Neuro: Alert and oriented X 3. Moves all extremities spontaneously. Gait is normal. CNII-XII grossly in tact. Psych:  Responds to questions appropriately with a normal affect.   X-ray reviewed from ED visit:  Minimal arthritis   ASSESSMENT AND PLAN:  64 y.o. year old male with diagnoses below.  Note was written by scribe    ICD-9-CM ICD-10-CM   1. Arthritis of right knee 716.96 M12.9 methylPREDNISolone acetate (DEPO-MEDROL) injection 80 mg  2. Right acute serous otitis media, recurrence not specified 381.01 H65.01 methylPREDNISolone acetate (DEPO-MEDROL) injection 80 mg    By signing my name below, I, Raven Small, attest that this documentation has been prepared under the direction and in the presence of Samuel Haber, MD.  Electronically Signed: Thea Alken, ED Scribe. 06/26/2015. 2:55 PM.   Signed, Samuel Haber, MD 06/26/2015 2:35 PM

## 2015-06-27 ENCOUNTER — Telehealth: Payer: Self-pay

## 2015-06-27 NOTE — Telephone Encounter (Signed)
Ok to write? 

## 2015-06-27 NOTE — Telephone Encounter (Signed)
Dr. Joseph Art   Patient needs a note to return to work without any restrictions regarding his right leg.  His wife will come by an pick up the note.  [phone] (M)

## 2015-06-28 NOTE — Telephone Encounter (Signed)
Okay to write note to return to work without restrictions

## 2015-06-28 NOTE — Telephone Encounter (Signed)
Advised note ready to be picked up.

## 2015-07-13 ENCOUNTER — Telehealth: Payer: Self-pay | Admitting: Cardiology

## 2015-07-13 NOTE — Telephone Encounter (Signed)
Spoke to patient. He has been having systolic BPs in [id number] and diastolics in [id number]. Occasional headache.  He states he had increased his losartan as directed from 50mg  to 100mg  daily when seen on 06/13/15. After about a week or two he was feeling poorly and thought might have to do with increased dose. He resumed 50mg  daily dose. He also reports he has been on prednisone and other meds after a bought of pneumonia & URI.  He just recently discontinued the prednisone.  2 days ago, understanding his BPs had still been running high, he resumed the losartan at 100mg  daily.  Advised pt that losartan may still need a few days to work optimally, to continue daily dose as recommended, other meds as recommended.  Also recommended keeping BP log BID.   Pt to call next week w/ readings. Told to call in interim if further concerns.

## 2015-07-13 NOTE — Telephone Encounter (Signed)
[name] is calling because , Samuel Chambers blood Pressure has gone up and headaches. Bp nor the pulse rate will come down to a good number .  Please call     Thanks

## 2015-08-14 ENCOUNTER — Other Ambulatory Visit: Payer: Self-pay | Admitting: Cardiology

## 2015-08-14 NOTE — Telephone Encounter (Signed)
REFILL 

## 2015-09-01 ENCOUNTER — Other Ambulatory Visit: Payer: Self-pay | Admitting: Cardiology

## 2015-09-01 NOTE — Telephone Encounter (Signed)
Rx request sent to pharmacy.  

## 2015-09-16 ENCOUNTER — Other Ambulatory Visit: Payer: Self-pay | Admitting: Internal Medicine

## 2015-09-18 ENCOUNTER — Telehealth: Payer: Self-pay | Admitting: Cardiology

## 2015-09-18 DIAGNOSIS — I159 Secondary hypertension, unspecified: Secondary | ICD-10-CM

## 2015-09-18 NOTE — Telephone Encounter (Signed)
NeW Message  Pt wife calling to see if labwork can be done the day pt comes to see Dr Ellyn Hack. No labwork order in syst. Please call back and discuss.

## 2015-09-18 NOTE — Telephone Encounter (Signed)
Follow Up ° °Pt wife returned the call  °

## 2015-09-18 NOTE — Telephone Encounter (Signed)
Left message to call back  

## 2015-09-18 NOTE — Telephone Encounter (Signed)
Pt wanting to do lab work before his annual appt scheduled on 2/23 with Dr. Ellyn Hack. Orders put in for CMET and lipid; and pt knows to be fasting.

## 2015-09-20 ENCOUNTER — Other Ambulatory Visit: Payer: Self-pay | Admitting: Internal Medicine

## 2015-09-21 ENCOUNTER — Encounter: Payer: Self-pay | Admitting: Cardiology

## 2015-09-21 ENCOUNTER — Ambulatory Visit (INDEPENDENT_AMBULATORY_CARE_PROVIDER_SITE_OTHER): Payer: BLUE CROSS/BLUE SHIELD | Admitting: Cardiology

## 2015-09-21 VITALS — BP 166/106 | HR 84 | Ht 72.0 in | Wt 269.6 lb

## 2015-09-21 DIAGNOSIS — E669 Obesity, unspecified: Secondary | ICD-10-CM

## 2015-09-21 DIAGNOSIS — I10 Essential (primary) hypertension: Secondary | ICD-10-CM

## 2015-09-21 DIAGNOSIS — I2510 Atherosclerotic heart disease of native coronary artery without angina pectoris: Secondary | ICD-10-CM

## 2015-09-21 DIAGNOSIS — E785 Hyperlipidemia, unspecified: Secondary | ICD-10-CM

## 2015-09-21 DIAGNOSIS — K219 Gastro-esophageal reflux disease without esophagitis: Secondary | ICD-10-CM | POA: Diagnosis not present

## 2015-09-21 DIAGNOSIS — Z9861 Coronary angioplasty status: Secondary | ICD-10-CM

## 2015-09-21 MED ORDER — METOPROLOL SUCCINATE ER 50 MG PO TB24: ORAL_TABLET | ORAL | Status: DC

## 2015-09-21 MED ORDER — DULOXETINE HCL 60 MG PO CPEP: ORAL_CAPSULE | ORAL | Status: DC

## 2015-09-21 MED ORDER — LOSARTAN POTASSIUM 100 MG PO TABS: 100.0000 mg | ORAL_TABLET | Freq: Every day | ORAL | Status: DC

## 2015-09-21 MED ORDER — FUROSEMIDE 20 MG PO TABS: 20.0000 mg | ORAL_TABLET | Freq: Every day | ORAL | Status: DC

## 2015-09-21 MED ORDER — CLOPIDOGREL BISULFATE 75 MG PO TABS: ORAL_TABLET | ORAL | Status: DC

## 2015-09-21 NOTE — Progress Notes (Signed)
PCP: No PCP Per Patient  Clinic Note: Chief Complaint  Patient presents with  . Follow-up    3 months per Tarri Fuller, PA-C//pt c/o fluctuating BP  . Coronary Artery Disease  . Hypertension    HPI: Samuel Chambers is a 65 y.o. male with a PMH below who presents today for three-month follow-up for CAD and hypertension. His coronary disease dates back to 2008 when he was treated with PCI of the circumflex with a Promus DES 2.5 mm x 8 mm. Most recent nuclear stress test in 2013 was negative   Samuel Chambers was last seen by me in Jan 2016, but he saw Tarri Fuller, [place] in November for chest discomfort. He also noted feeling lightheaded. He had been pretty sick at the time. He was seen by Dr. Melvyn Novas from pulmonary medicine for upper airway respiratory tract infection. He was treated with prednisone and Protonix as well as a flutter valve. He is also noticing some chest pressure and abdominal cramping. His blood pressure was running high in the try to increase his Cozaar to 100 mg, but he couldn't tolerate it. The chest pain was thought to be related to his cough, musculoskeletal. He was able to exert himself lifting 50 pounds without any significant symptoms.   Recent Hospitalizations:  None  Studies Reviewed: None   Interval History:  Mr. Crudele presents here today without any significant cardiac symptoms. He notes that his blood pressure has been going up and down that has been somewhat frustrating to him. I don't think he was able tolerate going directly to 100 mg of the losartan, so he went back to 50. He felt was really tired and "loopy". He says at home his blood pressures usually in the [id number] to [id number] systolic. When this higher than that he gets a little of a headache, no blurred vision or profound dyspnea. He is a little edema, so he's had to take his Lasix low but more frequently. He's not sure if that's related to swelling in his knees from knee arthritis. He is finally gotten over his  pneumonia episode that he had over September and October and is now back in the gym starting in January. He is doing about 3 days a week working out about 2 hours. This includes about a 50-20 minute stationary bicycle ride as well as weights other cardio exercises. With this level activity is not noticing any significant chest tightness or pressure suggest recurrent angina. No PND, orthopnea or exertional dyspnea. He has diffuse aches and pains from fibromyalgia, but is not overly symptomatic.    No palpitations, lightheadedness, dizziness, weakness or syncope/near syncope. No TIA/amaurosis fugax symptoms. No claudication.  ROS: A comprehensive was performed. Review of Systems  Constitutional: Positive for weight loss (trying). Negative for malaise/fatigue and diaphoresis.  HENT: Negative for nosebleeds.   Respiratory: Negative for shortness of breath and wheezing.        Resolved upper respiratory tract symptoms from September through October. No more cough or congestion.  Cardiovascular: Positive for leg swelling (a little bit).  Gastrointestinal: Negative for blood in stool and melena.  Genitourinary: Negative for hematuria.  Musculoskeletal: Positive for joint pain (bilateral kneearthritis pain).  Neurological: Positive for dizziness (only when the blood pressure is high) and headaches (Off-and-on). Negative for weakness.  Endo/Heme/Allergies: Bruises/bleeds easily.  Psychiatric/Behavioral: Negative for depression and memory loss. The patient is not nervous/anxious and does not have insomnia.   All other systems reviewed and are negative.  Past Medical History  Diagnosis Date  . Diverticulosis   . Internal hemorrhoids   . Adenomatous colon polyp 03/2005  . Hypertension   . Anxiety   . GERD (gastroesophageal reflux disease)   . Degenerative joint disease   . CAD S/P percutaneous coronary angioplasty 2008    PCI to circumflex with a Promus DES 2.5 mm x 8 mm  . PONV  (postoperative nausea and vomiting)   . Hyperlipidemia     statin intolerant  . Obesity, Class II, BMI 35-39.9   . Fibromyalgia     Past Surgical History  Procedure Laterality Date  . Hip surgery      complete  . Heel spur surgery    . Hemorrhoid surgery    . Knee arthroscopy  03/27/2012    Procedure: ARTHROSCOPY KNEE;  Surgeon: Gearlean Alf, MD;  Location: [facility];  Service: Orthopedics;  Laterality: Left;  . Total knee arthroplasty Left 09/04/2012    Procedure: TOTAL KNEE ARTHROPLASTY;  Surgeon: Gearlean Alf, MD;  Location: [facility];  Service: Orthopedics;  Laterality: Left;  . Cholecystectomy N/A 02/18/2013    Procedure: LAPAROSCOPIC CHOLECYSTECTOMY WITH INTRAOPERATIVE CHOLANGIOGRAM;  Surgeon: Pedro Earls, MD;  Location: [facility];  Service: General;  Laterality: N/A;  . Doppler echocardiography  05/27/2002    [facility]normal EF 55-66%,  . Nm myocar perf wall motion  12/19/2011    EXERCISED FOR 8-1/2 MINUTES RECHING 10 METABOLIC EQUIVALENTS. NO EVIDENCE  OF ISCHEMIA OR INFARCTION . EF 71%  . Renal dopplers  04/08/2007    relatively normal  . Coronary angioplasty with stent placement  04/30/2007    2.5 mm Promus stent that was to 95% Circ;had 60-70% lesion to RCA  . Holter monitor  04/07/2007    sinus tachy.;    Prior to Admission medications   Medication Sig Start Date End Date Taking? Authorizing Provider  aspirin EC 81 MG tablet Take 81 mg by mouth daily.   Yes Historical Provider, MD  clopidogrel (PLAVIX) 75 MG tablet TAKE 1 TABLET (75 MG TOTAL) BY MOUTH DAILY. 08/14/15  Yes Leonie Man, MD  DULoxetine (CYMBALTA) 60 MG capsule TAKE 1 CAPSULE (60 MG TOTAL) BY MOUTH EVERY MORNING. 08/14/15  Yes Leonie Man, MD  losartan (COZAAR) 100 MG tablet Take 0.5 tablets by mouth daily. 07/17/15  Yes Historical Provider, MD  metoprolol succinate (TOPROL-XL) 50 MG 24 hr tablet TAKE 1 TABLET (50 MG TOTAL) BY MOUTH DAILY. 09/01/15  Yes Leonie Man, MD  pantoprazole (PROTONIX) 40  MG tablet TAKE 1 TABLET BY MOUTH EVERY DAY 30-60 MINUTES BEFORE THE FIRST MEAL OF THE DAY 09/20/15  Yes Tanda Rockers, MD   Allergies  Allergen Reactions  . Crestor [Rosuvastatin] Other (See Comments)    myalgias  . Oxycodone Other (See Comments)    Hallucinations-ok in small doses  . Prednisone Other (See Comments)    Increased BP  . Warfarin And Related Other (See Comments)    Fast heart beat, went down hill, felt like he was dying    Social History   Social History  . Marital Status: Married    Spouse Name: N/A  . Number of Children: 1  . Years of Education: N/A   Occupational History  .     Social History Main Topics  . Smoking status: Never Smoker   . Smokeless tobacco: Never Used  . Alcohol Use: No  . Drug Use: No  . Sexual Activity: Not Asked  Other Topics Concern  . None   Social History Narrative   Very little 0-2 drinks a week   Family History  Problem Relation Age of Onset  . Breast cancer Mother   . Diabetes Mother   . Prostate cancer Father     Wt Readings from Last 3 Encounters:  09/21/15 269 lb 9.6 oz (122.29 kg)  06/26/15 273 lb (123.832 kg)  06/19/15 270 lb (122.471 kg)    PHYSICAL EXAM BP 166/106 mmHg  Pulse 84  Ht 6' (1.829 m)  Wt 269 lb 9.6 oz (122.29 kg)  BMI 36.56 kg/m2 General appearance: alert, cooperative, appears stated age, no distress.  Moderately obese. Pleasant mood and affect  Neck: no adenopathy, no carotid bruit and no JVD Lungs: clear to auscultation bilaterally, normal percussion bilaterally and non-labored Heart: regular rate and rhythm, S1 and S2 normal, no murmur, click, rub or gallop; nondisplaced PMI  Abdomen: soft, non-tender; bowel sounds normal; no masses,  no organomegaly; no HJR Extremities: extremities normal, atraumatic, no cyanosis, and edema - Trace  Pulses: 2+ and symmetric;  Neurologic: Mental status: Alert, oriented, thought content appropriate Cranial nerves: normal (II-XII grossly intact)     Adult ECG Report  Not checked   Other studies Reviewed: Additional studies/ records that were reviewed today include:  Recent Labs:   - just checked today. (NOT up at time of visit)  Lab Results  Component Value Date   CHOL 177 09/21/2015   HDL 36* 09/21/2015   LDLCALC 103 09/21/2015   TRIG 190* 09/21/2015   CHOLHDL 4.9 09/21/2015     ASSESSMENT / PLAN: Problem List Items Addressed This Visit    Obesity (BMI 30-39.9) (Chronic)    The patient understands the need to lose weight with diet and exercise. We have discussed specific strategies for this.      Hyperlipidemia with target LDL less than 70; statin intolerant (Chronic)    His LDL isn't that bad, but still not goal. He has tried both Crestor and Lipitor in the past, and says he has been on simvastatin before that. I thought about trying pravastatin, but he was relatively reluctant. He is simply taking fish oil. I will refer him to Tommy Medal, [place] who is running are "lipid clinic" going to the process of getting approval for PCSK9 inhibitors.      Relevant Medications   losartan (COZAAR) 100 MG tablet   furosemide (LASIX) 20 MG tablet   metoprolol succinate (TOPROL-XL) 50 MG 24 hr tablet   GERD (Chronic)    He has chronic GERD symptoms, and asked if we can refill his Nexium. I think this was initially prescribed by his cardiologist prior to me being here. We can then fill it now.      Essential hypertension - Primary (Chronic)    His blood pressure doesn't look very good today. I think we need better control. He is not tolerating going up to 100 mg of Cozaar in the past. Maybe we going about this too quickly. I will try to go slowly with increasing from  to 50 twice a day.  Then after about a week or 2 we can have him then go to 100 mg day.  He is already on Toprol at 50, which can also be increased or converted to cope L. We have heart rate room. He is not on diuretic either. I'm to have him come in for a blood  pressure check with Tommy Medal, our clinical pharmacist, who is  also going to be helping him to the process of evaluation for PCSK9 inhibitor.      Relevant Medications   losartan (COZAAR) 100 MG tablet   furosemide (LASIX) 20 MG tablet   metoprolol succinate (TOPROL-XL) 50 MG 24 hr tablet   CAD S/P percutaneous coronary angioplasty (Chronic)    Stable. A symptomatically no recurrent anginal symptoms. He is a pretty short DES stent. He is on Plavix plus aspirin. As I mentioned before, he can stop the aspirin. He has had difficulty taking multiple different statins. Most recently it was Crestor. Otherwise he is on an ARB and beta blocker and stable dose is that can be titrated. He had a Myoview not the longest was negative for ischemia and is able to exercise without any significant symptoms. Continue to encourage his exercise.      Relevant Medications   losartan (COZAAR) 100 MG tablet   furosemide (LASIX) 20 MG tablet   metoprolol succinate (TOPROL-XL) 50 MG 24 hr tablet      Current medicines are reviewed at length with the patient today. (+/- concerns) concerned about blood pressure The following changes have been made: per Pt Instructions: FOR 1 WEEK , TAKE 50 MG TWICE A DAY THEN INCREASE TO 100 MG LOSARTAN AT BEDTIME  TAKE LASIX 20 MG ( FUROSEMIDE)  DAILY   Your physician recommends that you schedule a follow-up appointment [facility]  - LIPID, BLOOD PRESSURE  APPOINTMENT  Your physician wants you to follow-up in [place].  Studies Ordered:   No orders of the defined types were placed in this encounter.      Leonie Man, M.D., M.S. Interventional Cardiologist   Pager # [phone] Phone # [phone] [address] [place], [place] [address]

## 2015-09-21 NOTE — Patient Instructions (Signed)
FOR 1 WEEK , TAKE 50 MG TWICE A DAY THEN INCREASE TO 100 MG LOSARTAN AT BEDTIME  TAKE LASIX 20 MG ( FUROSEMIDE)  DAILY   Your physician recommends that you schedule a follow-up appointment [facility]  - LIPID, BLOOD PRESSURE  APPOINTMENT  Your physician wants you to follow-up in [place].  You will receive a reminder letter in the mail two months in advance. If you don't receive a letter, please call our office to schedule the follow-up appointment.

## 2015-09-22 ENCOUNTER — Encounter: Payer: Self-pay | Admitting: Cardiology

## 2015-09-22 LAB — LIPID PANEL
CHOL/HDL RATIO: 4.9 ratio (ref <=5.0)
CHOLESTEROL: 177 mg/dL (ref 125–200)
HDL: 36 mg/dL — ABNORMAL LOW (ref >=40)
LDL Cholesterol: 103 mg/dL (ref <130)
Triglycerides: 190 mg/dL — ABNORMAL HIGH (ref <150)
VLDL: 38 mg/dL — ABNORMAL HIGH (ref <30)

## 2015-09-22 LAB — COMPREHENSIVE METABOLIC PANEL
ALK PHOS: 52 U/L (ref 40–115)
ALT: 31 U/L (ref 9–46)
AST: 27 U/L (ref 10–35)
Albumin: 4.2 g/dL (ref 3.6–5.1)
BILIRUBIN TOTAL: 0.7 mg/dL (ref 0.2–1.2)
BUN: 16 mg/dL (ref 7–25)
CO2: 29 mmol/L (ref 20–31)
Calcium: 9.3 mg/dL (ref 8.6–10.3)
Chloride: 103 mmol/L (ref 98–110)
Creat: 0.89 mg/dL (ref 0.70–1.25)
Glucose, Bld: 92 mg/dL (ref 65–99)
POTASSIUM: 4.2 mmol/L (ref 3.5–5.3)
Sodium: 139 mmol/L (ref 135–146)
TOTAL PROTEIN: 6.8 g/dL (ref 6.1–8.1)

## 2015-09-22 NOTE — Assessment & Plan Note (Signed)
His blood pressure doesn't look very good today. I think we need better control. He is not tolerating going up to 100 mg of Cozaar in the past. Maybe we going about this too quickly. I will try to go slowly with increasing from  to 50 twice a day.  Then after about a week or 2 we can have him then go to 100 mg day.  He is already on Toprol at 50, which can also be increased or converted to cope L. We have heart rate room. He is not on diuretic either. I'm to have him come in for a blood pressure check with Tommy Medal, our clinical pharmacist, who is also going to be helping him to the process of evaluation for PCSK9 inhibitor.

## 2015-09-22 NOTE — Assessment & Plan Note (Signed)
His LDL isn't that bad, but still not goal. He has tried both Crestor and Lipitor in the past, and says he has been on simvastatin before that. I thought about trying pravastatin, but he was relatively reluctant. He is simply taking fish oil. I will refer him to Tommy Medal, [place] who is running are "lipid clinic" going to the process of getting approval for PCSK9 inhibitors.

## 2015-09-22 NOTE — Assessment & Plan Note (Signed)
The patient understands the need to lose weight with diet and exercise. We have discussed specific strategies for this.  

## 2015-09-22 NOTE — Assessment & Plan Note (Signed)
Stable. A symptomatically no recurrent anginal symptoms. He is a pretty short DES stent. He is on Plavix plus aspirin. As I mentioned before, he can stop the aspirin. He has had difficulty taking multiple different statins. Most recently it was Crestor. Otherwise he is on an ARB and beta blocker and stable dose is that can be titrated. He had a Myoview not the longest was negative for ischemia and is able to exercise without any significant symptoms. Continue to encourage his exercise.

## 2015-09-22 NOTE — Assessment & Plan Note (Signed)
He has chronic GERD symptoms, and asked if we can refill his Nexium. I think this was initially prescribed by his cardiologist prior to me being here. We can then fill it now.

## 2015-10-05 ENCOUNTER — Encounter: Payer: Self-pay | Admitting: Pharmacist Clinician (PhC)/ Clinical Pharmacy Specialist

## 2015-10-05 ENCOUNTER — Ambulatory Visit (INDEPENDENT_AMBULATORY_CARE_PROVIDER_SITE_OTHER): Payer: BLUE CROSS/BLUE SHIELD | Admitting: Pharmacist Clinician (PhC)/ Clinical Pharmacy Specialist

## 2015-10-05 VITALS — BP 148/80 | HR 88 | Ht 72.0 in | Wt 275.0 lb

## 2015-10-05 DIAGNOSIS — I10 Essential (primary) hypertension: Secondary | ICD-10-CM

## 2015-10-05 DIAGNOSIS — E785 Hyperlipidemia, unspecified: Secondary | ICD-10-CM

## 2015-10-05 MED ORDER — EZETIMIBE 10 MG PO TABS: 10.0000 mg | ORAL_TABLET | Freq: Every day | ORAL | Status: DC

## 2015-10-05 MED ORDER — VALSARTAN 320 MG PO TABS: 320.0000 mg | ORAL_TABLET | Freq: Every day | ORAL | Status: DC

## 2015-10-05 NOTE — Assessment & Plan Note (Addendum)
Blood pressure still elevated in the office today.  Because he believes insomnia may be related to losartan, I am going to switch him to valsartan 320 mg daily.  He will continue to monitor home BP readings, but I did advise that he get a newer BP cuff, one with the appropriate cuff size.  I will see him back about 3 weeks after he switches medications (he has a week of the losartan remaining).

## 2015-10-05 NOTE — Assessment & Plan Note (Signed)
Patient with CAD and LDL at 103.  Because he has never tried ezetimibe, will start him on 10 mg daily.  Repeat labs in 2 months and determine if need for PCSK-9 at that time.  He will be retiring and switching insurance to Medicare in the next 3-6 months, so it would not make sense to try and get coverage for PCSK-9 before then.

## 2015-10-05 NOTE — Progress Notes (Signed)
10/05/2015 Ned Clines [birth year] ID:[id number]   HPI:  Samuel Chambers is a 65 y.o. male patient of Dr harding, with a PMH below who presents today for hypertension and lipid clinic evaluation.  He saw Dr. Ellyn Hack about 2 weeks ago and had an elevated BP at 166/106.  He was increased from 50 to 100 mg of losartan, but states that has had problems with insomnia since starting the 100 mg each evening.  His latest cholesterol showed TG of 190 and LDL of 103.  He suffers from fibromyalgia and has tried rosuvastatin, simvastatin and atorvastatin, all which caused myalgias.  He has no knowledge of having tried ezetimbe.    Cardiac Hx: CAD with PCI to circumflex in 2008 at age of 24, hypertension, hyperlipidemia  Family Hx: father and brother both with CVD  Social Hx: no tobacco or alcohol, drinks 2-3 caffeine drinks per day (coffee or cola)  Diet: breakfast is [employer] or Cheerios, otherwise eats out much of lunch and dinner, prefers meat/potatoes to vegetables  Exercise: just recently joined gym - 1-2 hours three times per week, half cardio, half weigh  Home BP readings: has older ReliOn (omron) meter, looks to be 51-74 years old.  Cuff most likely too small for arm, read about 20 points higher than office on both systolic/diastolic   Current antihypertensive medications: losartan 100 mg, metoprolol 50 mg qd  Current lipid medications: none  Intolerances: statins cause myalgias  Wt Readings from Last 3 Encounters:  10/05/15 275 lb (124.739 kg)  09/21/15 269 lb 9.6 oz (122.29 kg)  06/26/15 273 lb (123.832 kg)   BP Readings from Last 3 Encounters:  10/05/15 148/80  09/21/15 166/106  06/26/15 154/74   Pulse Readings from Last 3 Encounters:  10/05/15 88  09/21/15 84  06/26/15 87    Current Outpatient Prescriptions  Medication Sig Dispense Refill  . aspirin EC 81 MG tablet Take 81 mg by mouth daily.    . clopidogrel (PLAVIX) 75 MG tablet TAKE 1 TABLET (75 MG TOTAL) BY MOUTH  DAILY. 90 tablet 3  . DULoxetine (CYMBALTA) 60 MG capsule TAKE 1 CAPSULE (60 MG TOTAL) BY MOUTH EVERY MORNING. 90 capsule 3  . ezetimibe (ZETIA) 10 MG tablet Take 1 tablet (10 mg total) by mouth daily. 30 tablet 5  . furosemide (LASIX) 20 MG tablet Take 1 tablet (20 mg total) by mouth daily. 90 tablet 3  . metoprolol succinate (TOPROL-XL) 50 MG 24 hr tablet TAKE 1 TABLET (50 MG TOTAL) BY MOUTH DAILY. 90 tablet 3  . pantoprazole (PROTONIX) 40 MG tablet TAKE 1 TABLET BY MOUTH EVERY DAY 30-60 MINUTES BEFORE THE FIRST MEAL OF THE DAY 30 tablet 5  . valsartan (DIOVAN) 320 MG tablet Take 1 tablet (320 mg total) by mouth daily. 30 tablet 3   No current facility-administered medications for this visit.    Allergies  Allergen Reactions  . Crestor [Rosuvastatin] Other (See Comments)    myalgias  . Oxycodone Other (See Comments)    Hallucinations-ok in small doses  . Prednisone Other (See Comments)    Increased BP  . Warfarin And Related Other (See Comments)    Fast heart beat, went down hill, felt like he was dying    Past Medical History  Diagnosis Date  . Diverticulosis   . Internal hemorrhoids   . Adenomatous colon polyp 03/2005  . Hypertension   . Anxiety   . GERD (gastroesophageal reflux disease)   . Degenerative joint  disease   . CAD S/P percutaneous coronary angioplasty 2008    PCI to circumflex with a Promus DES 2.5 mm x 8 mm  . PONV (postoperative nausea and vomiting)   . Hyperlipidemia     statin intolerant  . Obesity, Class II, BMI 35-39.9   . Fibromyalgia     Blood pressure 148/80, pulse 88, height 6' (1.829 m), weight 275 lb (124.739 kg).    Tommy Medal PharmD CPP [facility]

## 2015-10-05 NOTE — Patient Instructions (Signed)
Return for a a follow up appointment in 6-8 wks  Your blood pressure today is 148/88  Check your blood pressure at home daily and keep record of the readings.  Take your BP meds as follows: switch losartan to valsartan 320 mg once daily.    Start ezetimibe 10 mg daily for your cholesterol.  We will repeat cholesterol labs in 3 months.    Bring all of your meds, your BP cuff and your record of home blood pressures to your next appointment.  Exercise as you're able, try to walk approximately 30 minutes per day.  Keep salt intake to a minimum, especially watch canned and prepared boxed foods.  Eat more fresh fruits and vegetables and fewer canned items.  Avoid eating in fast food restaurants.    HOW TO TAKE YOUR BLOOD PRESSURE: . Rest 5 minutes before taking your blood pressure. .  Don't smoke or drink caffeinated beverages for at least 30 minutes before. . Take your blood pressure before (not after) you eat. . Sit comfortably with your back supported and both feet on the floor (don't cross your legs). . Elevate your arm to heart level on a table or a desk. . Use the proper sized cuff. It should fit smoothly and snugly around your bare upper arm. There should be enough room to slip a fingertip under the cuff. The bottom edge of the cuff should be 1 inch above the crease of the elbow. . Ideally, take 3 measurements at one sitting and record the average.

## 2015-10-16 ENCOUNTER — Telehealth: Payer: Self-pay | Admitting: Gastroenterology

## 2015-10-16 NOTE — Telephone Encounter (Signed)
Patient is trying to retire and would like to move up his appts.  He is scheduled for an office visit for 11/24/15.  I did schedule his colonoscopy for 12/01/15.  He will try and bring permission to hold Plavix to that appt.

## 2015-10-25 ENCOUNTER — Telehealth: Payer: Self-pay | Admitting: Cardiology

## 2015-10-25 NOTE — Telephone Encounter (Signed)
Notified wife awaiting information

## 2015-10-25 NOTE — Telephone Encounter (Signed)
Forward to Dr HARDING. 

## 2015-10-25 NOTE — Telephone Encounter (Signed)
Pt is going to have Colonoscopy in April. He needs a note by Friday about stopping his Plavix for the Colonoscopy. Please call and his wife will pick up the note.

## 2015-10-26 NOTE — Telephone Encounter (Signed)
OK to stop Plavix 5-7 days pre-procedure.  Leonie Man, MD

## 2015-10-27 NOTE — Telephone Encounter (Signed)
Spoke to wife.  aware patient  Is able to hold PLAVIX FOR COLONSCOPY Patient dose not need to  Pick up letter. Patient has an appointment with Dr Fuller Plan in April 2017  information  will be routed to Dr Lynne Leader office - Marlon Pel CMA)  wife verbalized understanding.

## 2015-10-31 ENCOUNTER — Other Ambulatory Visit: Payer: Self-pay | Admitting: Internal Medicine

## 2015-10-31 MED ORDER — PANTOPRAZOLE SODIUM 40 MG PO TBEC: DELAYED_RELEASE_TABLET | ORAL | Status: DC

## 2015-11-09 ENCOUNTER — Ambulatory Visit (INDEPENDENT_AMBULATORY_CARE_PROVIDER_SITE_OTHER): Payer: BLUE CROSS/BLUE SHIELD | Admitting: Cardiology

## 2015-11-09 ENCOUNTER — Encounter: Payer: Self-pay | Admitting: Cardiology

## 2015-11-09 VITALS — BP 146/96 | HR 92 | Ht 72.0 in | Wt 279.4 lb

## 2015-11-09 DIAGNOSIS — E785 Hyperlipidemia, unspecified: Secondary | ICD-10-CM | POA: Diagnosis not present

## 2015-11-09 DIAGNOSIS — Z9861 Coronary angioplasty status: Secondary | ICD-10-CM

## 2015-11-09 DIAGNOSIS — I10 Essential (primary) hypertension: Secondary | ICD-10-CM

## 2015-11-09 DIAGNOSIS — R079 Chest pain, unspecified: Secondary | ICD-10-CM

## 2015-11-09 DIAGNOSIS — I2510 Atherosclerotic heart disease of native coronary artery without angina pectoris: Secondary | ICD-10-CM | POA: Diagnosis not present

## 2015-11-09 DIAGNOSIS — E669 Obesity, unspecified: Secondary | ICD-10-CM

## 2015-11-09 MED ORDER — LOSARTAN POTASSIUM 50 MG PO TABS: 50.0000 mg | ORAL_TABLET | Freq: Every day | ORAL | Status: DC

## 2015-11-09 NOTE — Patient Instructions (Signed)
STOP VALSARTAN   RESTART LOSARTAN 50 MG BY MOUTH DAILY  -- IF BLOOD PRESSURE IS ABOVE 140/80 AFTER  Sunday --INCREASE TO 100 MG DAILY  TAKE FUROSEMIDE ( LASIX ) EVERYDAY FOR 1 WEEK ONLY - THEN TAKE AS NEEDED.   Your physician recommends that you schedule a follow-up appointment in [place]- 30 MIN  If you need a refill on your cardiac medications before your next appointment, please call your pharmacy.

## 2015-11-09 NOTE — Progress Notes (Signed)
PCP: No PCP Per Patient  Clinic Note: Chief Complaint  Patient presents with  . Follow-up    SOB;active. CHEST PAIN; tightness. LIGHTHEAD/DIZZINESS;when getting up . LEG PAIN OR CRAMPING;right and left knee. EDEMA; arm and leg  all Sx. due to Valsartan meds  . Coronary Artery Disease    HPI: Samuel Chambers is a 65 y.o. male with a PMH below who presents today for Follow-up appointment after being seen for hypertension management. He has a history of CAD-PCI, hypertension that has been difficult control, hyperlipidemia and multiple medication intolerances secondary to fibromyalgia.  Samuel Chambers was last seen in Feb 2017.  He was noting strange unusual sensations in his chest that will usually at rest. The still associated with exertion. He was then followed up with Tommy Medal, RPH-CCP --> his losartan was changed over to valsartan, and Zetia was added for lipids. He took Zetia for about 2 days and stopped it because of "myalgias ". But now he presents with extensive complaints that he is activity in 2  Recent Hospitalizations:  None  Studies Reviewed:  None  Interval History:  Samuel Chambers presents today "fit to be tied "with all of the issues he is having. He cites almost all of the potential side effects that are seen with ARB's. He states that he is having shortness of breath, muscle aches and cramping, edema as well as significant episodes of chest pain. This chest tightness is heavy and pressure that takes his breath away. It is not again associated necessarily with exertion versus rest. He has had prolonged episodes of 10-15 minutes of chest discomfort. States that all of this happened to the intensity level once he switched from losartan to Diovan. He is subsequently for the last several days stop Diovan and started taking back 50 mg of losartan, and states the symptoms are somewhat subsiding. He and his wife are both adamant about the fact that he has adverse reactions to many  medications and that this is a common thing for him to have this type of reaction. They were very upset when I tried explaining to them that most of these symptoms are very unusual to be seen when switching from one ARB to another. They're very uncommon in general with any ARB.  He is noting chest pain with rest and with exertion as well as exertional dyspnea. Is having swelling and myalgias. Swelling is in both his arms and his legs. He is having pain in his knees and thighs/calves. He has not noted symptoms of PND, orthopnea or edema.  No palpitations/rapid irregular heartbeats. He has had some lightheadedness and dizziness. Some near-syncopal symptoms but no true syncope.  No TIA/amaurosis fugax symptoms. No melena, hematochezia, hematuria, or epstaxis. No claudication.  ROS: A comprehensive was performed. Review of Systems  Constitutional: Positive for malaise/fatigue.  HENT: Positive for congestion. Negative for ear discharge and nosebleeds.   Eyes: Negative for blurred vision and double vision.  Respiratory: Positive for shortness of breath and wheezing.   Cardiovascular: Negative for claudication.  Gastrointestinal: Negative for blood in stool and melena.  Genitourinary: Positive for flank pain. Negative for hematuria.  Musculoskeletal: Positive for myalgias, back pain and joint pain. Negative for falls.  Neurological: Positive for dizziness, weakness and headaches.  Endo/Heme/Allergies: Does not bruise/bleed easily.     Past Medical History  Diagnosis Date  . Diverticulosis   . Internal hemorrhoids   . Adenomatous colon polyp 03/2005  . Hypertension   . Anxiety   .  GERD (gastroesophageal reflux disease)   . Degenerative joint disease   . CAD S/P percutaneous coronary angioplasty 2008    PCI to circumflex with a Promus DES 2.5 mm x 8 mm  . PONV (postoperative nausea and vomiting)   . Hyperlipidemia     statin intolerant  . Obesity, Class II, BMI 35-39.9   . Fibromyalgia       Past Surgical History  Procedure Laterality Date  . Hip surgery      complete  . Heel spur surgery    . Hemorrhoid surgery    . Knee arthroscopy  03/27/2012    Procedure: ARTHROSCOPY KNEE;  Surgeon: Gearlean Alf, MD;  Location: [facility];  Service: Orthopedics;  Laterality: Left;  . Total knee arthroplasty Left 09/04/2012    Procedure: TOTAL KNEE ARTHROPLASTY;  Surgeon: Gearlean Alf, MD;  Location: [facility];  Service: Orthopedics;  Laterality: Left;  . Cholecystectomy N/A 02/18/2013    Procedure: LAPAROSCOPIC CHOLECYSTECTOMY WITH INTRAOPERATIVE CHOLANGIOGRAM;  Surgeon: Pedro Earls, MD;  Location: [facility];  Service: General;  Laterality: N/A;  . Doppler echocardiography  05/27/2002    [facility]normal EF 55-66%,  . Nm myocar perf wall motion  12/19/2011    EXERCISED FOR 8-1/2 MINUTES RECHING 10 METABOLIC EQUIVALENTS. NO EVIDENCE  OF ISCHEMIA OR INFARCTION . EF 71%  . Renal dopplers  04/08/2007    relatively normal  . Coronary angioplasty with stent placement  04/30/2007    2.5 mm Promus stent that was to 95% Circ;had 60-70% lesion to RCA  . Holter monitor  04/07/2007    sinus tachy.;   Prior to Admission medications   Medication Sig Start Date End Date Taking? Authorizing Provider  clopidogrel (PLAVIX) 75 MG tablet TAKE 1 TABLET (75 MG TOTAL) BY MOUTH DAILY. 09/21/15   Leonie Man, MD  DULoxetine (CYMBALTA) 60 MG capsule TAKE 1 CAPSULE (60 MG TOTAL) BY MOUTH EVERY MORNING. 09/21/15   Leonie Man, MD  furosemide (LASIX) 20 MG tablet Take 1 tablet (20 mg total) by mouth daily. 09/21/15   Leonie Man, MD  metoprolol succinate (TOPROL-XL) 50 MG 24 hr tablet TAKE 1 TABLET (50 MG TOTAL) BY MOUTH DAILY. 09/21/15   Leonie Man, MD  pantoprazole (PROTONIX) 40 MG tablet TAKE 1 TABLET BY MOUTH EVERY DAY 30-60 MINUTES BEFORE THE FIRST MEAL OF THE DAY 10/31/15   Tanda Rockers, MD  valsartan (DIOVAN) 320 MG tablet Take 1 tablet (320 mg total) by mouth daily. 10/05/15   Leonie Man,  MD    Allergies  Allergen Reactions  . Crestor [Rosuvastatin] Other (See Comments)    myalgias  . Oxycodone Other (See Comments)    Hallucinations-ok in small doses  . Prednisone Other (See Comments)    Increased BP  . Valsartan   . Warfarin And Related Other (See Comments)    Fast heart beat, went down hill, felt like he was dying    Social History   Social History  . Marital Status: Married    Spouse Name: N/A  . Number of Children: 1  . Years of Education: N/A   Occupational History  .     Social History Main Topics  . Smoking status: Never Smoker   . Smokeless tobacco: Never Used  . Alcohol Use: No  . Drug Use: No  . Sexual Activity: Not Asked   Other Topics Concern  . None   Social History Narrative   Very little 0-2 drinks a  week   Family History  Problem Relation Age of Onset  . Breast cancer Mother   . Diabetes Mother   . Prostate cancer Father     Wt Readings from Last 3 Encounters:  11/09/15 279 lb 6.4 oz (126.735 kg)  10/05/15 275 lb (124.739 kg)  09/21/15 269 lb 9.6 oz (122.29 kg)    PHYSICAL EXAM BP 146/96 mmHg  Pulse 92  Ht 6' (1.829 m)  Wt 279 lb 6.4 oz (126.735 kg)  BMI 37.89 kg/m2  SpO2 95% General appearance: alert, cooperative, appears stated age, no distress. Moderately obese. Pleasant mood and affect  HEENT: [place]/AT, EOMI, MMM, anicteric sclera Neck: no adenopathy, no carotid bruit and no JVD Lungs: clear to auscultation bilaterally, normal percussion bilaterally and non-labored Heart: regular rate and rhythm, S1 and S2 normal, no murmur, click, rub or gallop; nondisplaced PMI  Abdomen: soft, non-tender; bowel sounds normal; no masses, no organomegaly; no HJR Extremities: extremities normal, atraumatic, no cyanosis, and edema - Trace  Pulses: 2+ and symmetric;  Neurologic: Mental status: Alert, oriented, thought content appropriate Cranial nerves: normal (II-XII grossly intact)   Adult ECG Report n/a   Other studies  Reviewed: Additional studies/ records that were reviewed today include:  Recent Labs:   Lab Results  Component Value Date   CHOL 177 09/21/2015   HDL 36* 09/21/2015   LDLCALC 103 09/21/2015   TRIG 190* 09/21/2015   CHOLHDL 4.9 09/21/2015   Lab Results  Component Value Date   ALT 31 09/21/2015   AST 27 09/21/2015   ALKPHOS 52 09/21/2015   BILITOT 0.7 09/21/2015    ASSESSMENT / PLAN: Problem List Items Addressed This Visit    Obesity (BMI 30-39.9) (Chronic)    The patient understands the need to lose weight with diet and exercise. We have discussed specific strategies for this.      Hyperlipidemia with target LDL less than 70; statin intolerant (Chronic)    CAD and LDL not quite at goal. Did not tolerate Zetia (noting symptoms within several days). Once we get the issue of his hypertension regimen concluded, we can then figure out the next step. The fact that he is not been normal take any statins (and based on his recent symptomatology, I would simply not try a statin).  He is now not able to tolerate Zetia, this only leaves now PCSK9 inhibitors as a viable option for him. Thankfully his lipid levels are not too far to control that we have to act I would like to allow time for any medication effect to wear off before we consider any further treatment.      Relevant Medications   losartan (COZAAR) 50 MG tablet   Essential hypertension - Primary (Chronic)    Quite the disaster with Diovan. Very unusual to have these symptoms, but with the concerns that he has, we will simply stop the Diovan and gradually titrate back up to the losartan which he tolerated. Almost certainly, we will need to then take another medication to try. I would not start 2 medications again on him at the same time. He barely took Zetia for more than 2 days.  I'm concerned about some the symptoms he had, but don't want to come views medication effect with true anginal symptoms. I plan to have him return in roughly  3 weeks to reassess once he has been off the medication.      Relevant Medications   losartan (COZAAR) 50 MG tablet   Chest pain with  moderate risk for cardiac etiology    Most of the symptoms he described more musculoskeletal in nature, however he did describe a sensation of chest tightness and pressure associated with dyspnea on. This was mostly with rest, but he is not even been exerting himself. Unable to walk more than a couple feet according to his wife without profound pain. In light of this, I don't know whether he truly be having exertional symptoms or not. Line I would like to see how he does off of the medication in question so I will therefore see him back in about 3 weeks to discuss his symptoms at that time. If he is still having concerning chest discomfort symptoms, I would probably prefer to go directly to cardiac catheterization lab to evaluate for recurrent ischemic/anginal pain. If the pain is still there once valsartan is out of his system, then the most logical next conclusion would be that this is probably related to coronary disease.        CAD S/P percutaneous coronary angioplasty (Chronic)    He seems to be very concerned, as MI with the chest tightness symptoms that he was having. He was having chest pain simply while talking to me in the room. If the symptoms persist once any medication affect is more normal, I think the best option will be to perform relook cardiac catheterization to avoid any confusion. I would be concerned that these symptoms are potentially progressively worsening angina. Difficult to treat from a medication standpoint. He is on a standing dose of Toprol as well as aspirin and Plavix. He also takes Lasix which is helpful for hypertension and also for diastolic dysfunction mediated angina from increased LVEDP.      Relevant Medications   losartan (COZAAR) 50 MG tablet     PATIENT INSTRUCTIONS Current medicines are reviewed at length with the patient  today. (+/- concerns) Intolerant oc Valsartan & Zetia. The following changes have been made:   STOP VALSARTAN   RESTART LOSARTAN 50 MG BY MOUTH DAILY  -- IF BLOOD PRESSURE IS ABOVE 140/80 AFTER  Sunday --INCREASE TO 100 MG DAILY  TAKE FUROSEMIDE ( LASIX ) EVERYDAY FOR 1 WEEK ONLY - THEN TAKE AS NEEDED.   Your physician recommends that you schedule a follow-up appointment in [place] DR HARDING- 30 MIN  Studies Ordered:   No orders of the defined types were placed in this encounter.    The patient had multiple complaints and was upset about treatment. He was complaining that he was placed on inferior medication he was very upset about the symptoms he was having. Was very upset when told of these are not common reactions, stating that he is always having funny reactions to medicines are just his way. His wife was involved in the conversation for most the time. Greater than 50 minutes was spent with the patient. Of this >50% of the time was spent with direct counseling and discussion about his symptoms and options for future treatment     HARDING, Leonie Green, M.D., M.S. Interventional Cardiologist   Pager # [phone] Phone # [phone] [address] [place], [place] [address]

## 2015-11-15 ENCOUNTER — Encounter: Payer: Self-pay | Admitting: Cardiology

## 2015-11-15 DIAGNOSIS — R079 Chest pain, unspecified: Secondary | ICD-10-CM | POA: Insufficient documentation

## 2015-11-15 NOTE — Assessment & Plan Note (Addendum)
CAD and LDL not quite at goal. Did not tolerate Zetia (noting symptoms within several days). Once we get the issue of his hypertension regimen concluded, we can then figure out the next step. The fact that he is not been normal take any statins (and based on his recent symptomatology, I would simply not try a statin).  He is now not able to tolerate Zetia, this only leaves now PCSK9 inhibitors as a viable option for him. Thankfully his lipid levels are not too far to control that we have to act I would like to allow time for any medication effect to wear off before we consider any further treatment.

## 2015-11-15 NOTE — Assessment & Plan Note (Signed)
The patient understands the need to lose weight with diet and exercise. We have discussed specific strategies for this.  

## 2015-11-15 NOTE — Assessment & Plan Note (Signed)
He seems to be very concerned, as MI with the chest tightness symptoms that he was having. He was having chest pain simply while talking to me in the room. If the symptoms persist once any medication affect is more normal, I think the best option will be to perform relook cardiac catheterization to avoid any confusion. I would be concerned that these symptoms are potentially progressively worsening angina. Difficult to treat from a medication standpoint. He is on a standing dose of Toprol as well as aspirin and Plavix. He also takes Lasix which is helpful for hypertension and also for diastolic dysfunction mediated angina from increased LVEDP.

## 2015-11-15 NOTE — Assessment & Plan Note (Signed)
Most of the symptoms he described more musculoskeletal in nature, however he did describe a sensation of chest tightness and pressure associated with dyspnea on. This was mostly with rest, but he is not even been exerting himself. Unable to walk more than a couple feet according to his wife without profound pain. In light of this, I don't know whether he truly be having exertional symptoms or not. Line I would like to see how he does off of the medication in question so I will therefore see him back in about 3 weeks to discuss his symptoms at that time. If he is still having concerning chest discomfort symptoms, I would probably prefer to go directly to cardiac catheterization lab to evaluate for recurrent ischemic/anginal pain. If the pain is still there once valsartan is out of his system, then the most logical next conclusion would be that this is probably related to coronary disease.

## 2015-11-15 NOTE — Assessment & Plan Note (Signed)
Quite the disaster with Diovan. Very unusual to have these symptoms, but with the concerns that he has, we will simply stop the Diovan and gradually titrate back up to the losartan which he tolerated. Almost certainly, we will need to then take another medication to try. I would not start 2 medications again on him at the same time. He barely took Zetia for more than 2 days.  I'm concerned about some the symptoms he had, but don't want to come views medication effect with true anginal symptoms. I plan to have him return in roughly 3 weeks to reassess once he has been off the medication.

## 2015-11-16 ENCOUNTER — Ambulatory Visit: Payer: BLUE CROSS/BLUE SHIELD | Admitting: Pharmacist Clinician (PhC)/ Clinical Pharmacy Specialist

## 2015-11-24 ENCOUNTER — Encounter: Payer: Self-pay | Admitting: Gastroenterology

## 2015-11-24 ENCOUNTER — Ambulatory Visit (INDEPENDENT_AMBULATORY_CARE_PROVIDER_SITE_OTHER): Payer: BLUE CROSS/BLUE SHIELD | Admitting: Gastroenterology

## 2015-11-24 VITALS — BP 160/90 | HR 104 | Ht 71.0 in | Wt 278.1 lb

## 2015-11-24 DIAGNOSIS — Z86010 Personal history of colonic polyps: Secondary | ICD-10-CM

## 2015-11-24 DIAGNOSIS — Z7901 Long term (current) use of anticoagulants: Secondary | ICD-10-CM | POA: Diagnosis not present

## 2015-11-24 NOTE — Patient Instructions (Signed)
You have been scheduled for a colonoscopy. Please follow written instructions given to you at your visit today.  Please pick up your prep supplies at the pharmacy within the next 1-3 days. If you use inhalers (even only as needed), please bring them with you on the day of your procedure. Your physician has requested that you go to www.startemmi.com and enter the access code given to you at your visit today. This web site gives a general overview about your procedure. However, you should still follow specific instructions given to you by our office regarding your preparation for the procedure.  Normal BMI (Body Mass Index- based on height and weight) is between 19 and 25. Your BMI today is Body mass index is 38.81 kg/(m^2). Marland Kitchen Please consider follow up  regarding your BMI with your Primary Care Provider.   Thank you for choosing me and [facility].  Samuel Chambers. Dagoberto Ligas., MD., Marval Regal

## 2015-11-24 NOTE — Progress Notes (Signed)
    History of Present Illness: This is a 65 year old male here for the evaluation of a personal history of adenomatous colon polyps. He is maintained on Plavix for CAD post DES. Last colonoscopy in 02/2012 with 4 adenomatous colon polyps and a 3 year surveillance in 02/2015 was advised. He is returning now for follow up. Denies weight loss, abdominal pain, constipation, diarrhea, change in stool caliber, melena, hematochezia, nausea, vomiting, dysphagia, reflux symptoms, chest pain.  Review of Systems: Pertinent positive and negative review of systems were noted in the above HPI section. All other review of systems were otherwise negative.  Current Medications, Allergies, Past Medical History, Past Surgical History, Family History and Social History were reviewed in [employer] record.  Physical Exam: General: Well developed, well nourished, no acute distress Head: Normocephalic and atraumatic Eyes:  sclerae anicteric, EOMI Ears: Normal auditory acuity Mouth: No deformity or lesions Neck: Supple, no masses or thyromegaly Lungs: Clear throughout to auscultation Heart: Regular rate and rhythm; no murmurs, rubs or bruits Abdomen: Soft, non tender and non distended. No masses, hepatosplenomegaly or hernias noted. Normal Bowel sounds Rectal: deferred to colonoscopy Musculoskeletal: Symmetrical with no gross deformities  Skin: No lesions on visible extremities Pulses:  Normal pulses noted Extremities: No clubbing, cyanosis, edema or deformities noted Neurological: Alert oriented x 4, grossly nonfocal Cervical Nodes:  No significant cervical adenopathy Inguinal Nodes: No significant inguinal adenopathy Psychological:  Alert and cooperative. Normal mood and affect  Assessment and Recommendations:  1. Personal history of adenomatous colon polyps. The risks (including bleeding, perforation, infection, missed lesions, medication reactions and possible hospitalization or  surgery if complications occur), benefits, and alternatives to colonoscopy with possible biopsy and possible polypectomy were discussed with the patient and they consent to proceed.   2. CAD post DES. Hold Plavix 5 days before procedure - will instruct when and how to resume after procedure. Low but real risk of cardiovascular event such as heart attack, stroke, embolism, thrombosis or ischemia/infarct of other organs off Plavix explained and need to seek urgent help if this occurs. The patient consents to proceed. Will communicate by phone or EMR with patient's prescribing provider to confirm that holding Plavix is reasonable in this case.

## 2015-11-27 ENCOUNTER — Telehealth: Payer: Self-pay | Admitting: Gastroenterology

## 2015-11-27 MED ORDER — NA SULFATE-K SULFATE-MG SULF 17.5-3.13-1.6 GM/177ML PO SOLN: 1.0000 | Freq: Once | ORAL | Status: DC

## 2015-11-27 NOTE — Telephone Encounter (Signed)
Prescription resent to patient's pharmacy. 

## 2015-11-29 ENCOUNTER — Ambulatory Visit (INDEPENDENT_AMBULATORY_CARE_PROVIDER_SITE_OTHER): Payer: BLUE CROSS/BLUE SHIELD | Admitting: Cardiovascular Disease

## 2015-11-29 VITALS — BP 139/90 | HR 83 | Ht 71.0 in | Wt 274.4 lb

## 2015-11-29 DIAGNOSIS — I10 Essential (primary) hypertension: Secondary | ICD-10-CM | POA: Diagnosis not present

## 2015-11-29 DIAGNOSIS — E785 Hyperlipidemia, unspecified: Secondary | ICD-10-CM

## 2015-11-29 DIAGNOSIS — Z9861 Coronary angioplasty status: Secondary | ICD-10-CM

## 2015-11-29 DIAGNOSIS — I2510 Atherosclerotic heart disease of native coronary artery without angina pectoris: Secondary | ICD-10-CM | POA: Diagnosis not present

## 2015-11-29 DIAGNOSIS — E669 Obesity, unspecified: Secondary | ICD-10-CM

## 2015-11-29 MED ORDER — HYDROCHLOROTHIAZIDE 25 MG PO TABS: 25.0000 mg | ORAL_TABLET | Freq: Every day | ORAL | Status: DC

## 2015-11-29 NOTE — Patient Instructions (Signed)
Dr Sallyanne Kuster has recommended making the following medication changes: 1. START Hydrochlorothiazide 25 mg - take 1 tablet by mouth daily  Your physician has requested that you regularly monitor and record your blood pressure readings at home. Please use the same machine at the same time of day to check your readings and record them to bring to your follow-up visit. Please purchase a new cuff. A good brand is Omron. Please come into the office for nurse visit to make sure your new blood pressure cuff is correct.  Please call the office (or use mychart) in 2 weeks with your blood pressure readings.  Dr Sallyanne Kuster recommends that you schedule a follow-up appointment in 6-8 weeks.  If you need a refill on your cardiac medications before your next appointment, please call your pharmacy.

## 2015-11-30 ENCOUNTER — Encounter: Payer: Self-pay | Admitting: Cardiovascular Disease

## 2015-11-30 NOTE — Progress Notes (Signed)
Patient ID: Samuel Chambers, male   DOB: Jul 15, 1951, 65 y.o.   MRN: [id number]     Cardiology Office Note    Date:  11/30/2015   ID:  Samuel Chambers, DOB 1950/09/28, MRN [id number]  PCP:  No PCP Per Patient  Cardiologist:   Sanda Klein, MD  chief complaint:Erratic blood pressure control   History of Present Illness:  Samuel Chambers is a 65 y.o. male with a long-standing history of coronary artery disease status post remote drug-eluting stent to the left circumflex coronary artery in 2008, without subsequent coronary events. Was recently his problems have been related to difficult to control systemic hypertension. He is severely obese and also has hyperlipidemia  Since his blood pressure control was inadequate on maximum dose losartan, he was switched to valsartan but immediately felt very poorly and has switched back to the original prescription for losartan. Seems that the biggest complaint was generalized muscle cramps. His home monitor appeared inaccurate so he bought a new one. This has a medium-size cuff, probably too small for his very large upper arms. Checked in the office today, this current monitor also exaggerates his blood pressure. It has been reporting diastolic blood pressure consistently in the 110-120 range, direct comparison today shows that it is roughly 15 mmHg higher than what I get by the office sphygmomanometer. Therefore unfortunately we cannot rely on the home blood pressure reports.   When he checked into the office today his blood pressure was 139/90 mmHg. However, when I checked it just a few minutes later his blood pressure was 160/10 5 mmHg.  He has not had problems of angina pectoris or exertional dyspnea. Denies palpitations, dizziness or syncope. He sweats profusely (lifelong). He denies headaches and flushing.  He has tried 3 different statins (atorvastatin, simvastatin, rosuvastatin) as well as ezetimibe, all of which cause muscle aches.   Past Medical History    Diagnosis Date  . Diverticulosis   . Internal hemorrhoids   . Adenomatous colon polyp 03/2005  . Hypertension   . Anxiety   . GERD (gastroesophageal reflux disease)   . Degenerative joint disease   . CAD S/P percutaneous coronary angioplasty 2008    PCI to circumflex with a Promus DES 2.5 mm x 8 mm  . PONV (postoperative nausea and vomiting)   . Hyperlipidemia     statin intolerant  . Obesity, Class II, BMI 35-39.9   . Fibromyalgia     Past Surgical History  Procedure Laterality Date  . Hip surgery      complete  . Heel spur surgery    . Hemorrhoid surgery    . Knee arthroscopy  03/27/2012    Procedure: ARTHROSCOPY KNEE;  Surgeon: Gearlean Alf, MD;  Location: [facility];  Service: Orthopedics;  Laterality: Left;  . Total knee arthroplasty Left 09/04/2012    Procedure: TOTAL KNEE ARTHROPLASTY;  Surgeon: Gearlean Alf, MD;  Location: [facility];  Service: Orthopedics;  Laterality: Left;  . Cholecystectomy N/A 02/18/2013    Procedure: LAPAROSCOPIC CHOLECYSTECTOMY WITH INTRAOPERATIVE CHOLANGIOGRAM;  Surgeon: Pedro Earls, MD;  Location: [facility];  Service: General;  Laterality: N/A;  . Doppler echocardiography  05/27/2002    [facility]normal EF 55-66%,  . Nm myocar perf wall motion  12/19/2011    EXERCISED FOR 8-1/2 MINUTES RECHING 10 METABOLIC EQUIVALENTS. NO EVIDENCE  OF ISCHEMIA OR INFARCTION . EF 71%  . Renal dopplers  04/08/2007    relatively normal  . Coronary angioplasty with stent placement  04/30/2007    2.5 mm Promus stent that was to 95% Circ;had 60-70% lesion to RCA  . Holter monitor  04/07/2007    sinus tachy.;    Current Medications: Outpatient Prescriptions Prior to Visit  Medication Sig Dispense Refill  . clopidogrel (PLAVIX) 75 MG tablet TAKE 1 TABLET (75 MG TOTAL) BY MOUTH DAILY. 90 tablet 3  . DULoxetine (CYMBALTA) 60 MG capsule TAKE 1 CAPSULE (60 MG TOTAL) BY MOUTH EVERY MORNING. 90 capsule 3  . furosemide (LASIX) 20 MG tablet Take 1 tablet (20 mg total) by  mouth daily. 90 tablet 3  . losartan (COZAAR) 100 MG tablet Take 100 mg by mouth daily.    . metoprolol succinate (TOPROL-XL) 50 MG 24 hr tablet TAKE 1 TABLET (50 MG TOTAL) BY MOUTH DAILY. 90 tablet 3  . Na Sulfate-K Sulfate-Mg Sulf SOLN Take 1 kit by mouth once. 354 mL 0  . pantoprazole (PROTONIX) 40 MG tablet TAKE 1 TABLET BY MOUTH EVERY DAY 30-60 MINUTES BEFORE THE FIRST MEAL OF THE DAY 90 tablet 0   No facility-administered medications prior to visit.     Allergies:   Crestor; Oxycodone; Prednisone; Valsartan; and Warfarin and related   Social History   Social History  . Marital Status: Married    Spouse Name: N/A  . Number of Children: 1  . Years of Education: N/A   Occupational History  .     Social History Main Topics  . Smoking status: Never Smoker   . Smokeless tobacco: Never Used  . Alcohol Use: No  . Drug Use: No  . Sexual Activity: Not on file   Other Topics Concern  . Not on file   Social History Narrative   Very little 0-2 drinks a week     Family History:  The patient's family history includes Breast cancer in his mother; Diabetes in his mother; Prostate cancer in his father.   ROS:   Please see the history of present illness.    ROS All other systems reviewed and are negative.   PHYSICAL EXAM:   VS:  BP 139/90 mmHg  Pulse 83  Ht _0  (1.803 m)  Wt 124.456 kg (274 lb 6 oz)  BMI 38.28 kg/m2   GEN: A very obese but also quite muscular, well developed, in no acute distress HEENT: normal Neck: no JVD, carotid bruits, or masses Cardiac: RRR; no murmurs, rubs, or gallops,no edema  Respiratory:  clear to auscultation bilaterally, normal work of breathing GI: soft, nontender, nondistended, + BS MS: no deformity or atrophy Skin: warm and dry, no rash Neuro:  Alert and Oriented x 3, Strength and sensation are intact Psych: euthymic mood, full affect  Wt Readings from Last 3 Encounters:  11/29/15 124.456 kg (274 lb 6 oz)  11/24/15 126.157 kg (278 lb  2 oz)  11/09/15 126.735 kg (279 lb 6.4 oz)      Studies/Labs Reviewed:   EKG:  EKG is not ordered today.    Recent Labs: 09/21/2015: ALT 31; BUN 16; Creat 0.89; Potassium 4.2; Sodium 139   Lipid Panel    Component Value Date/Time   CHOL 177 09/21/2015 1410   TRIG 190* 09/21/2015 1410   HDL 36* 09/21/2015 1410   CHOLHDL 4.9 09/21/2015 1410   VLDL 38* 09/21/2015 1410   LDLCALC 103 09/21/2015 1410      ASSESSMENT:    1. Essential hypertension   2. CAD S/P percutaneous coronary angioplasty   3. Hyperlipidemia with target LDL less than  70; statin intolerant   4. Obesity (BMI 30-39.9)      PLAN:  In order of problems listed above:  1. HTN: We spent quite a while discussing weight loss, regular exercise and sodium restriction. His sodium intake appears to be rather high because he eats a lot of deli meats. We talked about avoiding processed foods, canned foods, other sodium rich ingredients. Will add hydrochlorothiazide to his losartan continue metoprolol as well. I asked him to return his monitoring get one with a large blood pressure cuff 2. CAD: Currently asymptomatic 3. HLP: I told him that I'm ordered about the fact that he is not on any lipid-lowering medications. He is convinced that he is unable to take statin. The only other option is PCS K9 inhibitors with the attendant cost problems. For now we'll focus on "fixing" the blood pressure problem will have to return and discussed this. His HDL is low and triglycerides are high, both suggesting that he has a high-risk metabolic pattern, both reinforcing the importance of weight loss and regular physical exercise. His LDL is actually not that bad, but is likely to have small dense LDL particles. 4. Obesity: He would benefit from weight loss in multiple ways. Reviewed calorie restriction and regular physical activity. Discussed a diet rich in protein and high value fat, low in saturated fat and sugars and  starches.    Medication Adjustments/Labs and Tests Ordered: Current medicines are reviewed at length with the patient today.  Concerns regarding medicines are outlined above.  Medication changes, Labs and Tests ordered today are listed in the Patient Instructions below. Patient Instructions  Dr Sallyanne Kuster has recommended making the following medication changes: 1. START Hydrochlorothiazide 25 mg - take 1 tablet by mouth daily  Your physician has requested that you regularly monitor and record your blood pressure readings at home. Please use the same machine at the same time of day to check your readings and record them to bring to your follow-up visit. Please purchase a new cuff. A good brand is Omron. Please come into the office for nurse visit to make sure your new blood pressure cuff is correct.  Please call the office (or use mychart) in 2 weeks with your blood pressure readings.  Dr Sallyanne Kuster recommends that you schedule a follow-up appointment in 6-8 weeks.  If you need a refill on your cardiac medications before your next appointment, please call your pharmacy.    Mikael Spray, MD  11/30/2015 1:01 PM    [facility] [address], [place], [place]  [address] Phone: [phone]; Fax: [phone]

## 2015-12-01 ENCOUNTER — Ambulatory Visit (AMBULATORY_SURGERY_CENTER): Payer: BLUE CROSS/BLUE SHIELD | Admitting: Gastroenterology

## 2015-12-01 ENCOUNTER — Encounter: Payer: Self-pay | Admitting: Gastroenterology

## 2015-12-01 VITALS — BP 122/82 | HR 97 | Temp 98.9°F | Resp 14 | Ht 71.0 in | Wt 278.0 lb

## 2015-12-01 DIAGNOSIS — D123 Benign neoplasm of transverse colon: Secondary | ICD-10-CM

## 2015-12-01 DIAGNOSIS — Z86010 Personal history of colonic polyps: Secondary | ICD-10-CM | POA: Diagnosis not present

## 2015-12-01 DIAGNOSIS — D124 Benign neoplasm of descending colon: Secondary | ICD-10-CM

## 2015-12-01 MED ORDER — SODIUM CHLORIDE 0.9 % IV SOLN: 500.0000 mL | INTRAVENOUS | Status: DC

## 2015-12-01 NOTE — Op Note (Signed)
[place] Patient Name: Samuel Chambers Procedure Date: 12/01/2015 1:49 PM MRN: [id number] Endoscopist: Ladene Artist , MD Age: 65 Date of Birth: 24-Oct-1950 Gender: Male Procedure:                Colonoscopy Indications:              Surveillance: Personal history of adenomatous                            polyps on last colonoscopy > 3 years ago Medicines:                Monitored Anesthesia Care Procedure:                Pre-Anesthesia Assessment:                           - Prior to the procedure, a History and Physical                            was performed, and patient medications and                            allergies were reviewed. The patient's tolerance of                            previous anesthesia was also reviewed. The risks                            and benefits of the procedure and the sedation                            options and risks were discussed with the patient.                            All questions were answered, and informed consent                            was obtained. Prior Anticoagulants: The patient has                            taken Plavix (clopidogrel), last dose was 5 days                            prior to procedure. ASA Grade Assessment: II - A                            patient with mild systemic disease. After reviewing                            the risks and benefits, the patient was deemed in                            satisfactory condition to undergo the procedure.  After obtaining informed consent, the colonoscope                            was passed under direct vision. Throughout the                            procedure, the patient's blood pressure, pulse, and                            oxygen saturations were monitored continuously. The                            Model PCF-H190L [id number]) scope was introduced                            through the anus and advanced to the the cecum,                          identified by appendiceal orifice and ileocecal                            valve. The colonoscopy was performed without                            difficulty. The patient tolerated the procedure                            well. The quality of the bowel preparation was                            good. The ileocecal valve, appendiceal orifice, and                            rectum were photographed. Scope In: 1:56:45 PM Scope Out: 2:14:29 PM Scope Withdrawal Time: 0 hours 14 minutes 46 seconds  Total Procedure Duration: 0 hours 17 minutes 44 seconds  Findings:                 The digital rectal exam was normal.                           Three sessile polyps were found in the descending                            colon and transverse colon. The polyps were 6 to 7                            mm in size. These polyps were removed with a cold                            snare. Resection and retrieval were complete.                           Internal hemorrhoids were found during  retroflexion. The hemorrhoids were small and Grade                            I (internal hemorrhoids that do not prolapse).                           The exam was otherwise normal throughout the                            examined colon.                           There was a small lipoma, 15 mm in diameter, in the                            transverse colon with a pillow sign. Complications:            No immediate complications. Estimated Blood Loss:     Estimated blood loss: none. Impression:               - Three 6 to 7 mm polyps in the descending colon                            and in the transverse colon, removed with a cold                            snare. Resected and retrieved.                           - Lipoma in the transverse colon                           - Internal hemorrhoids. Recommendation:           - Patient has a contact number available for                             emergencies. The signs and symptoms of potential                            delayed complications were discussed with the                            patient. Return to normal activities tomorrow.                            Written discharge instructions were provided to the                            patient.                           - Resume previous diet.                           - Continue present medications.                           -  Await pathology results.                           - Repeat colonoscopy in 5 years for surveillance.                           - Resume Plavix tomorrow Ladene Artist, MD 12/01/2015 2:20:15 PM This report has been signed electronically.

## 2015-12-01 NOTE — Progress Notes (Signed)
Report to PACU, RN, vss, BBS= Clear.  

## 2015-12-01 NOTE — Progress Notes (Signed)
Called to room to assist during endoscopic procedure.  Patient ID and intended procedure confirmed with present staff. Received instructions for my participation in the procedure from the performing physician.  

## 2015-12-01 NOTE — Patient Instructions (Signed)

## 2015-12-04 ENCOUNTER — Telehealth: Payer: Self-pay

## 2015-12-04 ENCOUNTER — Ambulatory Visit: Payer: BLUE CROSS/BLUE SHIELD | Admitting: Cardiology

## 2015-12-04 NOTE — Telephone Encounter (Signed)
  Follow up Call-  Call back number 12/01/2015  Post procedure Call Back phone  # [phone]  Permission to leave phone message Yes     Patient questions:  Do you have a fever, pain , or abdominal swelling? No. Pain Score  0 *  Have you tolerated food without any problems? Yes.    Have you been able to return to your normal activities? Yes.    Do you have any questions about your discharge instructions: Diet   No. Medications  No. Follow up visit  No.  Do you have questions or concerns about your Care? No.  Actions: * If pain score is 4 or above: No action needed, pain <4.

## 2015-12-07 ENCOUNTER — Ambulatory Visit: Payer: BLUE CROSS/BLUE SHIELD | Admitting: *Deleted

## 2015-12-07 ENCOUNTER — Encounter: Payer: Self-pay | Admitting: *Deleted

## 2015-12-07 VITALS — BP 154/89 | HR 88 | Ht 71.0 in | Wt 274.0 lb

## 2015-12-07 DIAGNOSIS — I10 Essential (primary) hypertension: Secondary | ICD-10-CM

## 2015-12-07 NOTE — Progress Notes (Signed)
1.) Reason for visit: BP check  2.) Name of MD requesting visit: Dr Sallyanne Kuster  3.) H&P: HTN  4.) ROS related to problem: BP Checks: Manual BP: 142/88, 140/98, HR 88       Automatic cuff from home: 142/100, 154/89.  Assessment and plan per MD: Pt aware to keep diary of BP for the next 2 week and call us back to let us know or send to MyChart per last office visit with Dr Sallyanne Kuster.    Pt verbalized understanding to take BP about the same time each day, with the same cuff, and after sitting to rest at least 5 minutes.

## 2015-12-09 ENCOUNTER — Ambulatory Visit (HOSPITAL_COMMUNITY)
Admission: EM | Admit: 2015-12-09 | Discharge: 2015-12-09 | Disposition: A | Discharge status: Home / Self Care | Payer: BLUE CROSS/BLUE SHIELD | Attending: Emergency Medicine | Admitting: Emergency Medicine

## 2015-12-09 ENCOUNTER — Encounter (HOSPITAL_COMMUNITY): Payer: Self-pay | Admitting: Emergency Medicine

## 2015-12-09 DIAGNOSIS — E876 Hypokalemia: Secondary | ICD-10-CM | POA: Diagnosis not present

## 2015-12-09 DIAGNOSIS — I491 Atrial premature depolarization: Secondary | ICD-10-CM | POA: Diagnosis not present

## 2015-12-09 LAB — POCT I-STAT, CHEM 8
BUN: 18 mg/dL (ref 6–20)
Calcium, Ion: 1.12 mmol/L — ABNORMAL LOW (ref 1.13–1.30)
Chloride: 97 mmol/L — ABNORMAL LOW (ref 101–111)
Creatinine, Ser: 1 mg/dL (ref 0.61–1.24)
Glucose, Bld: 92 mg/dL (ref 65–99)
HEMATOCRIT: 46 % (ref 39.0–52.0)
HEMOGLOBIN: 15.6 g/dL (ref 13.0–17.0)
POTASSIUM: 3.3 mmol/L — AB (ref 3.5–5.1)
Sodium: 140 mmol/L (ref 135–145)
TCO2: 29 mmol/L (ref 0–100)

## 2015-12-09 MED ORDER — POTASSIUM CHLORIDE CRYS ER 20 MEQ PO TBCR
40.0000 meq | EXTENDED_RELEASE_TABLET | Freq: Once | ORAL | Status: AC
  Administered 2015-12-09: 40 meq via ORAL

## 2015-12-09 MED ORDER — POTASSIUM CHLORIDE ER 20 MEQ PO TBCR: 20.0000 meq | EXTENDED_RELEASE_TABLET | Freq: Every day | ORAL | Status: DC

## 2015-12-09 MED ORDER — POTASSIUM CHLORIDE CRYS ER 20 MEQ PO TBCR
EXTENDED_RELEASE_TABLET | ORAL | Status: AC
  Filled 2015-12-09: qty 2

## 2015-12-09 NOTE — ED Notes (Signed)
The patient presented to the [facility] with a complaint of lightheadedness and hypotension that occurred today. The patient stated that he "felt bad" today and when he checked his BP it was 121/ 71 which is low for him and his HR was 121. The patient stated that he contacted the on call person with his cardiologist and the NP stated to come to the [facility] for a blood draw to determine potassium levels and an EKG.

## 2015-12-09 NOTE — ED Provider Notes (Signed)
CSN: [id number]     Arrival date & time 12/09/15  1729 History   First MD Initiated Contact with Patient 12/09/15 1749     Chief Complaint  Patient presents with  . Dizziness  . Hypotension   (Consider location/radiation/quality/duration/timing/severity/associated sxs/prior Treatment) HPI  He is a 65 year old man here with his wife for evaluation of irregular heartbeat. He states that earlier today he was feeling fatigued and a little weak. He took his blood pressure and it was 120/70 which she states is low for him. He also reports feeling skipped beats when he took his pulse. He denies any dizziness, nausea, chest pain, or shortness of breath. He called his cardiologist who recommended evaluation with an EKG and some blood work. He was recently started on HCTZ.  Past Medical History  Diagnosis Date  . Diverticulosis   . Internal hemorrhoids   . Adenomatous colon polyp 03/2005  . Hypertension   . Anxiety   . GERD (gastroesophageal reflux disease)   . Degenerative joint disease   . CAD S/P percutaneous coronary angioplasty 2008    PCI to circumflex with a Promus DES 2.5 mm x 8 mm  . PONV (postoperative nausea and vomiting)   . Hyperlipidemia     statin intolerant  . Obesity, Class II, BMI 35-39.9   . Fibromyalgia    Past Surgical History  Procedure Laterality Date  . Hip surgery      complete  . Heel spur surgery    . Hemorrhoid surgery    . Knee arthroscopy  03/27/2012    Procedure: ARTHROSCOPY KNEE;  Surgeon: Gearlean Alf, MD;  Location: [facility];  Service: Orthopedics;  Laterality: Left;  . Total knee arthroplasty Left 09/04/2012    Procedure: TOTAL KNEE ARTHROPLASTY;  Surgeon: Gearlean Alf, MD;  Location: [facility];  Service: Orthopedics;  Laterality: Left;  . Cholecystectomy N/A 02/18/2013    Procedure: LAPAROSCOPIC CHOLECYSTECTOMY WITH INTRAOPERATIVE CHOLANGIOGRAM;  Surgeon: Pedro Earls, MD;  Location: [facility];  Service: General;  Laterality: N/A;  . Doppler  echocardiography  05/27/2002    [facility]normal EF 55-66%,  . Nm myocar perf wall motion  12/19/2011    EXERCISED FOR 8-1/2 MINUTES RECHING 10 METABOLIC EQUIVALENTS. NO EVIDENCE  OF ISCHEMIA OR INFARCTION . EF 71%  . Renal dopplers  04/08/2007    relatively normal  . Coronary angioplasty with stent placement  04/30/2007    2.5 mm Promus stent that was to 95% Circ;had 60-70% lesion to RCA  . Holter monitor  04/07/2007    sinus tachy.;  . Colonoscopy     Family History  Problem Relation Age of Onset  . Breast cancer Mother   . Diabetes Mother   . Prostate cancer Father   . Colon cancer Neg Hx    Social History  Substance Use Topics  . Smoking status: Never Smoker   . Smokeless tobacco: Never Used  . Alcohol Use: No    Review of Systems As in history of present illness Allergies  Crestor; Oxycodone; Prednisone; Valsartan; and Warfarin and related  Home Medications   Prior to Admission medications   Medication Sig Start Date End Date Taking? Authorizing Provider  clopidogrel (PLAVIX) 75 MG tablet TAKE 1 TABLET (75 MG TOTAL) BY MOUTH DAILY. 09/21/15  Yes Leonie Man, MD  DULoxetine (CYMBALTA) 60 MG capsule TAKE 1 CAPSULE (60 MG TOTAL) BY MOUTH EVERY MORNING. 09/21/15  Yes Leonie Man, MD  furosemide (LASIX) 20 MG tablet Take 1 tablet (  20 mg total) by mouth daily. 09/21/15  Yes Leonie Man, MD  hydrochlorothiazide (HYDRODIURIL) 25 MG tablet Take 1 tablet (25 mg total) by mouth daily. 11/29/15  Yes Mihai Croitoru, MD  losartan (COZAAR) 100 MG tablet Take 100 mg by mouth daily.   Yes Historical Provider, MD  metoprolol succinate (TOPROL-XL) 50 MG 24 hr tablet TAKE 1 TABLET (50 MG TOTAL) BY MOUTH DAILY. 09/21/15  Yes Leonie Man, MD  pantoprazole (PROTONIX) 40 MG tablet TAKE 1 TABLET BY MOUTH EVERY DAY 30-60 MINUTES BEFORE THE FIRST MEAL OF THE DAY 10/31/15  Yes Tanda Rockers, MD  potassium chloride 20 MEQ TBCR Take 20 mEq by mouth daily. 12/09/15   Melony Overly, MD   Meds  Ordered and Administered this Visit   Medications  potassium chloride SA (K-DUR,KLOR-CON) CR tablet 40 mEq (not administered)    BP 143/82 mmHg  Pulse 74  Temp(Src) 98.7 F (37.1 C) (Oral)  Resp 18  SpO2 96% No data found.   Physical Exam  Constitutional: He is oriented to person, place, and time. He appears well-developed and well-nourished. No distress.  Neck: Neck supple.  Cardiovascular: Normal rate and normal heart sounds.   No murmur heard. He does have an occasional early beat followed by a slight pause.  Pulmonary/Chest: Effort normal and breath sounds normal. No respiratory distress. He has no wheezes. He has no rales.  Neurological: He is alert and oriented to person, place, and time.    ED Course  Procedures (including critical care time) ED ECG REPORT   Date: 12/09/2015  Rate: 88  Rhythm: normal sinus rhythm and premature atrial contractions (PAC)  QRS Axis: left  Intervals: normal  ST/T Wave abnormalities: normal  Conduction Disutrbances:none  Narrative Interpretation: NSR with occasional PAC  Old EKG Reviewed: unchanged  I have personally reviewed the EKG tracing and agree with the computerized printout as noted.  Labs Review Labs Reviewed  POCT I-STAT, CHEM 8 - Abnormal; Notable for the following:    Potassium 3.3 (*)    Chloride 97 (*)    Calcium, Ion 1.12 (*)    All other components within normal limits    Imaging Review No results found.    MDM   1. Hypokalemia   2. PAC (premature atrial contraction)    40 mEq of potassium given here. Prescription given for 7 days of 20 mEq of potassium. Follow-up with cardiologist later this week. Return precautions reviewed.    Melony Overly, MD 12/09/15 [phone]

## 2015-12-09 NOTE — Discharge Instructions (Signed)
Your potassium is a little low and your EKG shows something called PACs. The irregular beats are likely due to the low potassium. Please take the potassium tablet once a day for the next week. Start this tomorrow. Follow-up with your cardiologist later this week for a recheck. If you develop dizziness, nausea, chest pain, or shortness of breath, please go to the emergency room.

## 2015-12-11 ENCOUNTER — Encounter: Payer: Self-pay | Admitting: Gastroenterology

## 2015-12-11 ENCOUNTER — Telehealth: Payer: Self-pay | Admitting: Cardiovascular Disease

## 2015-12-11 NOTE — Telephone Encounter (Signed)
Please have him reduce HCTZ to 12.5 mg daily. Have him skip it for a day, may help K rebound quicker. Suspect heart rate is underestimated (PACs are so premature that they do not generate a perceptible pulse - this can be the case whether checking heart rate with fingers at wrist, pulse-oximeter or automatic BP cuff). BP is not too low at 120s/70s.

## 2015-12-11 NOTE — Telephone Encounter (Signed)
Spoke with pt wife, okay per DPR, pt had issues this weekend where he did not feel normal. She state he was not c/o of chest pain, pressures, palpitations, sob, edema, dizziness, or headaches. She states when palpating his HR she feels like it is skipping beats. She check HR manually and it was 44-45 at times through out the day. When evaluated at the Urgent care they were told he is having PAC's.   Called pt to discuss and pt stated he checked his BP and it was 121/77 HR 101 which is low for him. Pt stated he went to Urgent care where the did labs and stated his Potassium was low and gave him supplements to take. Pt just took potassium this morning and really does not feel any difference. Pt had no c/o of cramping. Pt stated his BP is normally 140/80-150/90. Pt and wife are very worried what could be causing his BP and HR to be this drastically difference in short periods of time. Pt and wife educated that BP and HR are expected to vary throughout the day with activity, but a HR of 44 during the day and him not feeling well are what are the most concerning. Pt stated he feels like his BP is either to high or to low, he dose not feel normal.  Told them I would send not to Md to review and we will call back with any recommendations he has. Pt verbalized understanding, no additional questions at this time.

## 2015-12-11 NOTE — Telephone Encounter (Signed)
Left message to call back  

## 2015-12-11 NOTE — Telephone Encounter (Signed)
Samuel Chambers is calling because Samuel Chambers got very light headed on Saturday and they check his BP and his heart is skipping beats , his heart rate is around 44-45 , not having any heart attack symptoms. He was placed on HCTZ  , the doctor at the Urgent Care gave him two Potassium tablets. He went to Urgent Care on Saturday . Please call   Thanks

## 2015-12-12 NOTE — Telephone Encounter (Signed)
Pt returned call from yesterday. Pt has already taken his daily dose of 25 mg of HCTZ today, pt going to decrease to 12.5 mg by daily start tomorrow. Pt has 3 doses of Potassium left that was given to him in the Urgent Care on Saturday. Pt stated he feels okay today and going to call our office in a week to let us know how he is feeling. Pt stated he didn't think he was supposed to say on this medication for long and with cutting it in half he has about a week and a half of medication left before he will need a refill. Pt verbalized understanding, no additional questions at this time.

## 2016-01-04 IMAGING — CR DG KNEE COMPLETE 4+V*R*
4 series · 4 of 4 positions shown · non-contrast
Comparison: None.

CLINICAL DATA: Right-sided knee pain for 1 day, no known injury,
initial encounter

EXAM:
RIGHT KNEE - COMPLETE 4+ VIEW

[knee ap]
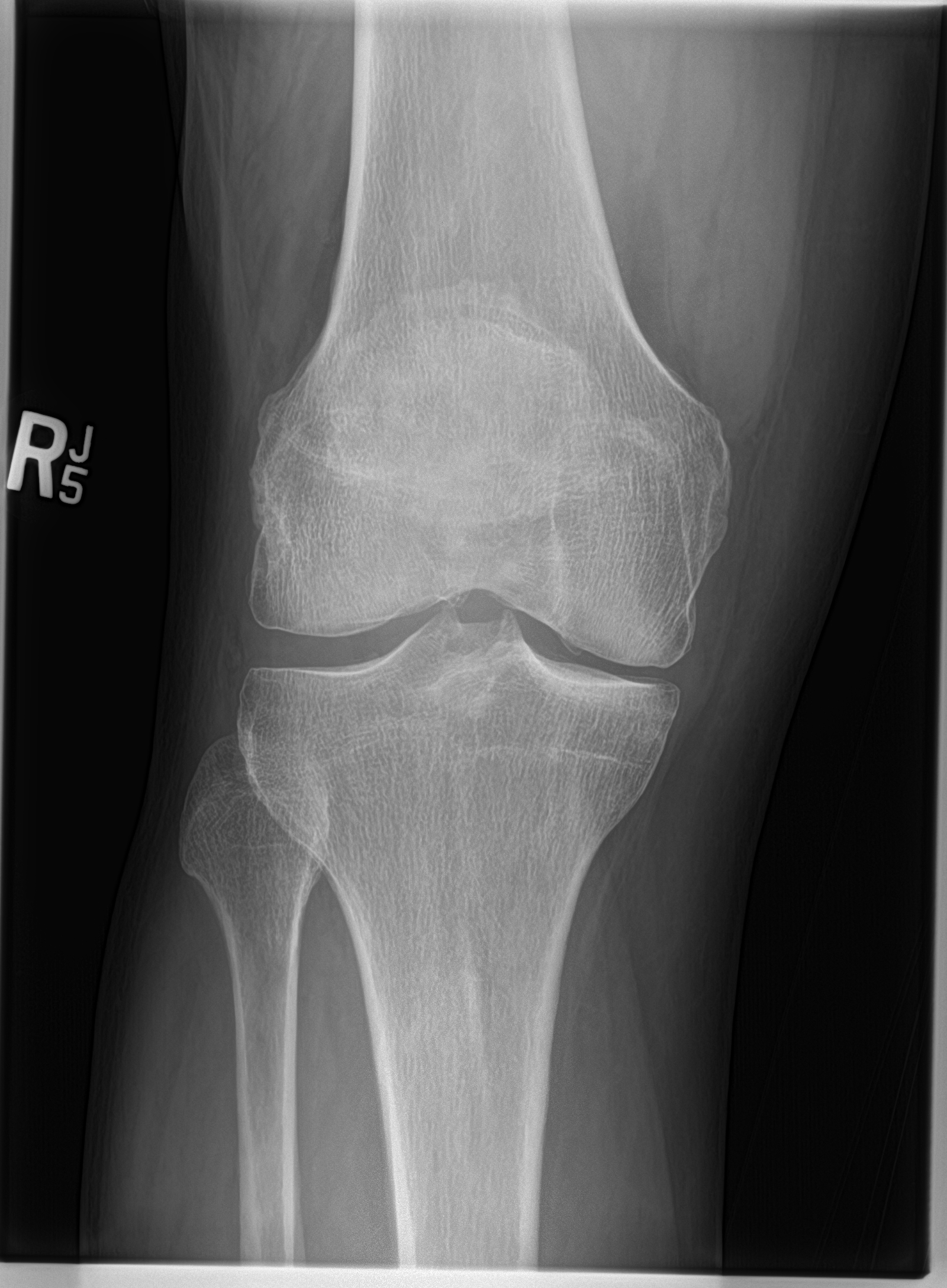

[knee lat]
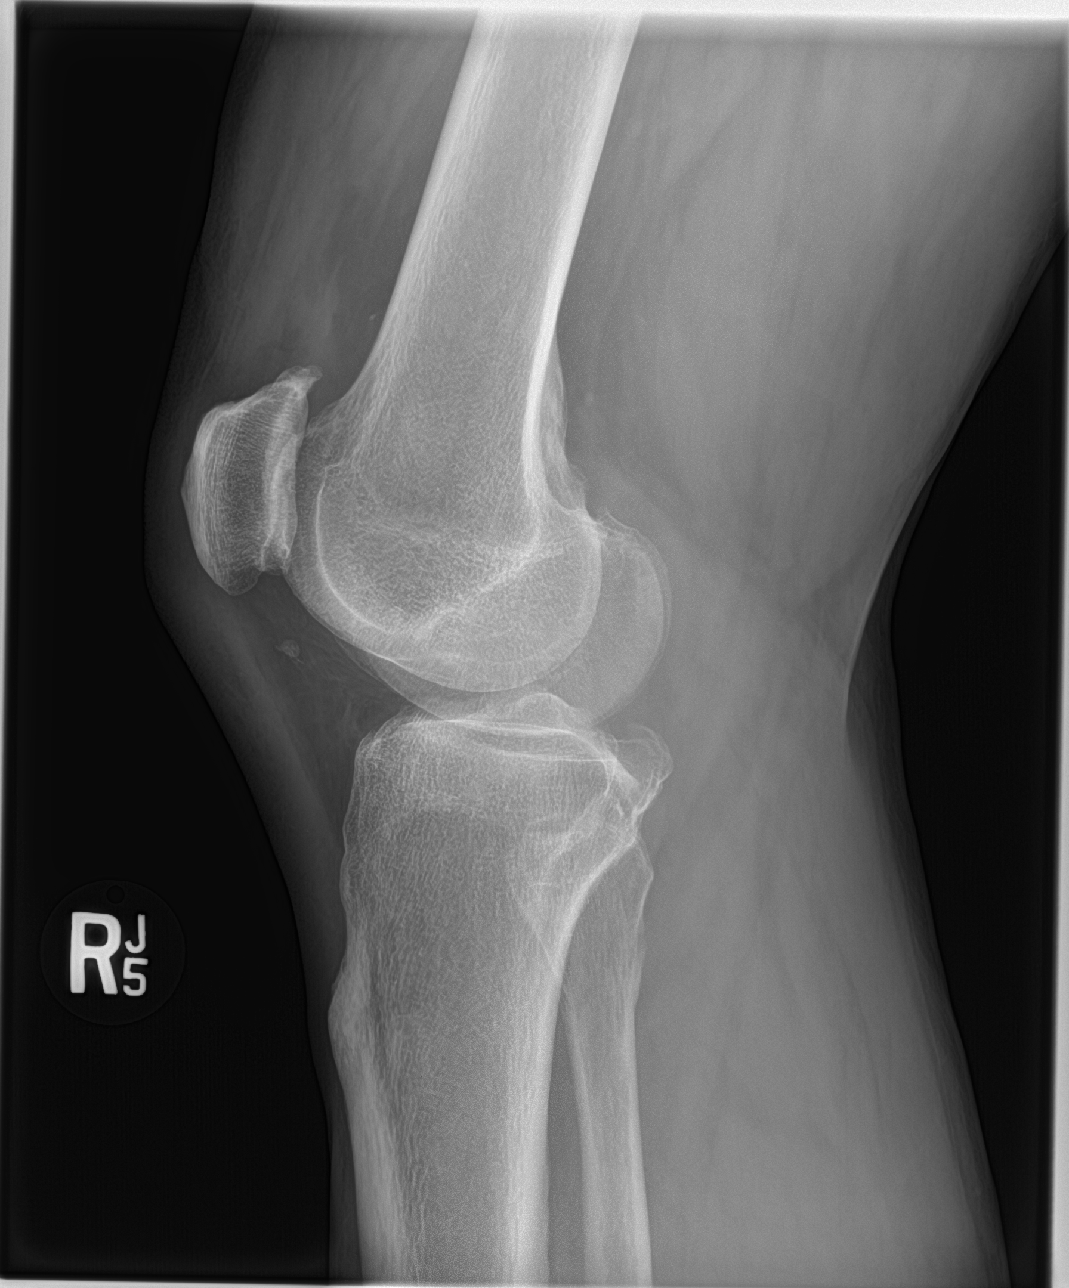

[knee obl (1 of 2)]
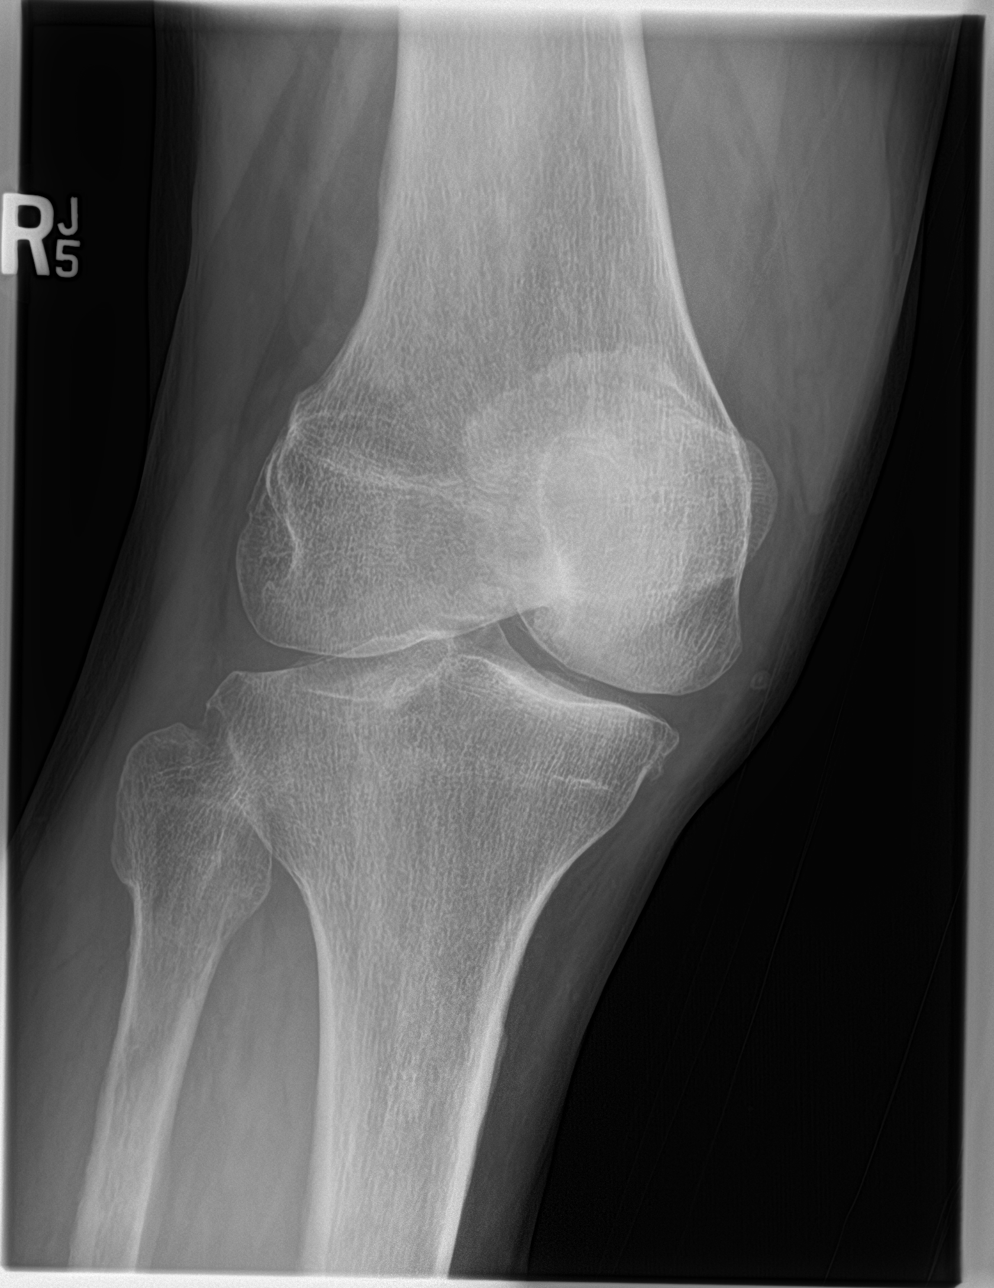

[knee obl (2 of 2)]
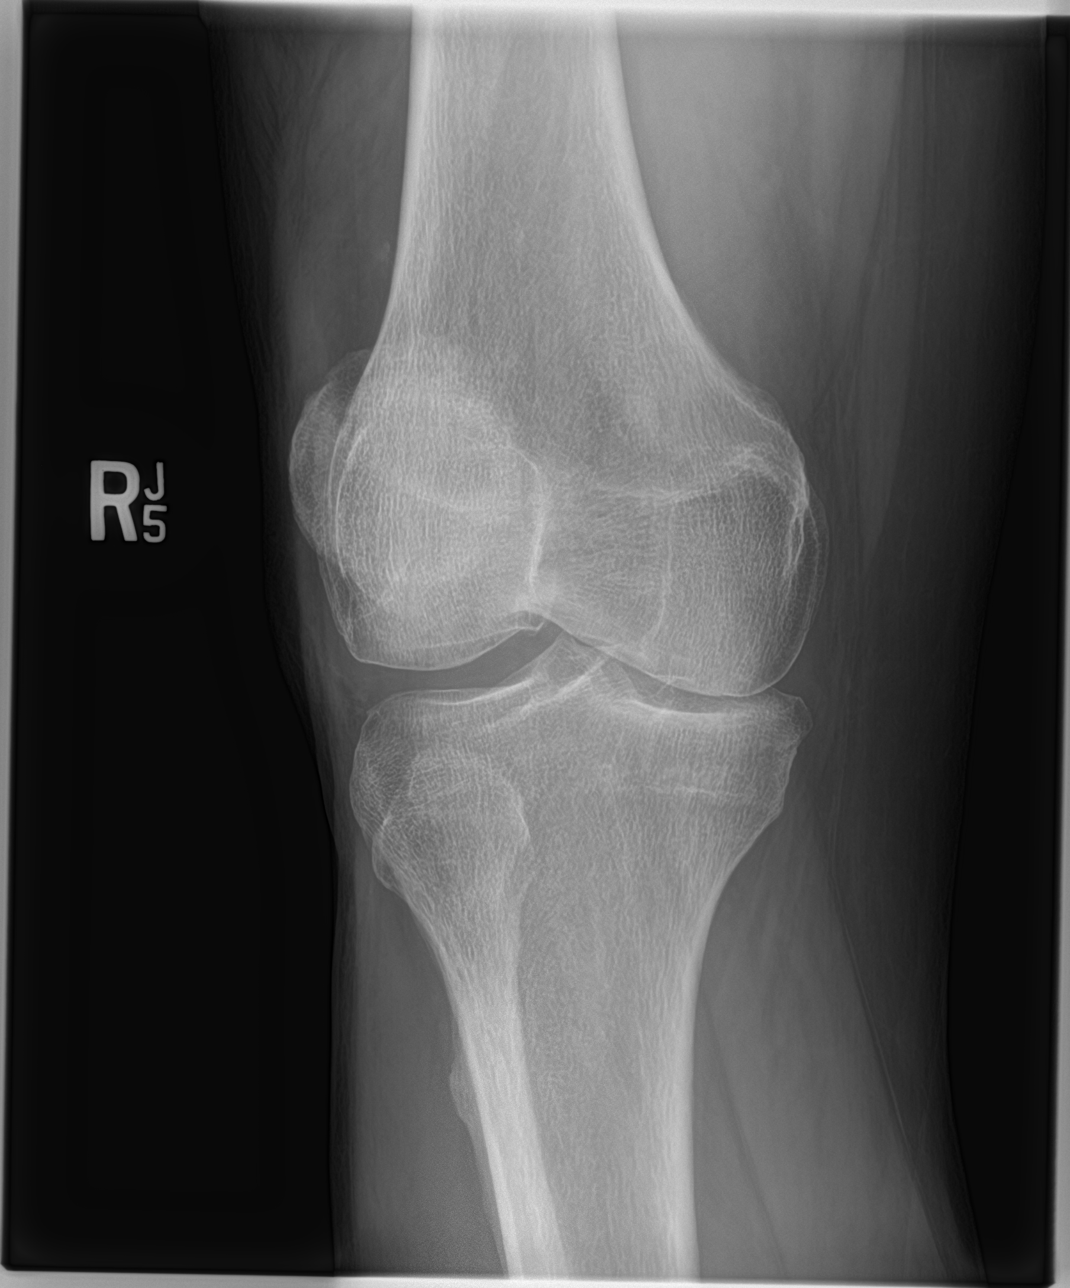

[4 of 4 positions shown; findings below may reference images not displayed]

FINDINGS: Medial joint space narrowing is noted. Spurring is also noted within
the patellofemoral space. No significant joint effusion is seen. No
acute fracture or dislocation is noted.
IMPRESSION: Degenerative changes of the knee without acute abnormality.

## 2016-02-01 ENCOUNTER — Ambulatory Visit: Payer: Self-pay | Admitting: Cardiovascular Disease

## 2016-02-19 ENCOUNTER — Other Ambulatory Visit: Payer: Self-pay | Admitting: Internal Medicine

## 2016-02-20 ENCOUNTER — Other Ambulatory Visit: Payer: Self-pay | Admitting: Internal Medicine

## 2016-02-27 DIAGNOSIS — M170 Bilateral primary osteoarthritis of knee: Secondary | ICD-10-CM | POA: Diagnosis not present

## 2016-02-27 DIAGNOSIS — M25561 Pain in right knee: Secondary | ICD-10-CM | POA: Diagnosis not present

## 2016-02-27 DIAGNOSIS — M1712 Unilateral primary osteoarthritis, left knee: Secondary | ICD-10-CM | POA: Diagnosis not present

## 2016-02-27 DIAGNOSIS — Z471 Aftercare following joint replacement surgery: Secondary | ICD-10-CM | POA: Diagnosis not present

## 2016-02-27 DIAGNOSIS — Z96652 Presence of left artificial knee joint: Secondary | ICD-10-CM | POA: Diagnosis not present

## 2016-02-27 DIAGNOSIS — M1711 Unilateral primary osteoarthritis, right knee: Secondary | ICD-10-CM | POA: Diagnosis not present

## 2016-03-25 ENCOUNTER — Ambulatory Visit (INDEPENDENT_AMBULATORY_CARE_PROVIDER_SITE_OTHER): Payer: Medicare Other | Admitting: Cardiovascular Disease

## 2016-03-25 ENCOUNTER — Encounter (INDEPENDENT_AMBULATORY_CARE_PROVIDER_SITE_OTHER): Payer: Self-pay

## 2016-03-25 ENCOUNTER — Encounter: Payer: Self-pay | Admitting: Cardiovascular Disease

## 2016-03-25 VITALS — BP 140/91 | HR 86 | Ht 71.0 in | Wt 274.2 lb

## 2016-03-25 DIAGNOSIS — E785 Hyperlipidemia, unspecified: Secondary | ICD-10-CM

## 2016-03-25 DIAGNOSIS — I10 Essential (primary) hypertension: Secondary | ICD-10-CM | POA: Diagnosis not present

## 2016-03-25 DIAGNOSIS — I2510 Atherosclerotic heart disease of native coronary artery without angina pectoris: Secondary | ICD-10-CM

## 2016-03-25 DIAGNOSIS — Z9861 Coronary angioplasty status: Secondary | ICD-10-CM | POA: Diagnosis not present

## 2016-03-25 DIAGNOSIS — Z6835 Body mass index (BMI) 35.0-35.9, adult: Secondary | ICD-10-CM

## 2016-03-25 NOTE — Patient Instructions (Addendum)
Dr Sallyanne Kuster recommends that you schedule a follow-up appointment in 6 months. You will receive a reminder letter in the mail two months in advance. If you don't receive a letter, please call our office to schedule the follow-up appointment.  If you need a refill on your cardiac medications before your next appointment, please call your pharmacy.   Your physician encouraged you to lose weight for better health.  Your physician has requested that you regularly monitor and record your blood pressure readings at home. Please use the same machine at the same time of day to check your readings and record them to bring to your follow-up visit. Please communicate these with [place] by either using mychart, mail, or calling to speak with a nurse

## 2016-03-25 NOTE — Progress Notes (Signed)
Patient ID: Samuel Chambers, male   DOB: [birth year], 65 y.o.   MRN: [id number]     Cardiology Office Note    Date:  03/25/2016   ID:  Samuel Chambers, DOB 11/14/1950, MRN [id number]  PCP:  Sanda Klein, MD  Cardiologist:   Sanda Klein, MD  chief complaint:Erratic blood pressure control   History of Present Illness:  Samuel Chambers is a 65 y.o. male with a long-standing history of coronary artery disease status post remote drug-eluting stent to the left circumflex coronary artery in 2008, without subsequent coronary events. He has had difficulty in controlling his hypertension. He is severely obese and also has hyperlipidemia, intolerant to statins.  After his last appointment he retired. He now feels much better. He denies any complaints of angina, dyspnea, leg edema, dizziness, palpitations, syncope. He continues to describe profuse sweating which is a lifelong condition. His ability to exercise is limited by need problems: He has had a left total knee replacement and just received a shot in his right knee. He does have a pool and knows how to swim. I encouraged him to use this for exercise.  When he checked into the office today his blood pressure was 140/98 mmHg. However, when I checked it just a few minutes later the diastolic blood pressure was down to 91 mmHg.  He has tried 3 different statins (atorvastatin, simvastatin, rosuvastatin) as well as ezetimibe, all of which cause muscle aches. Most recent LDL cholesterol was borderline acceptable at 103. He has mild hypertriglyceridemia, low HDL cholesterol and very prominent ventral adiposity.  Past Medical History:  Diagnosis Date  . Adenomatous colon polyp 03/2005  . Anxiety   . CAD S/P percutaneous coronary angioplasty 2008   PCI to circumflex with a Promus DES 2.5 mm x 8 mm  . Degenerative joint disease   . Diverticulosis   . Fibromyalgia   . GERD (gastroesophageal reflux disease)   . Hyperlipidemia    statin intolerant  .  Hypertension   . Internal hemorrhoids   . Obesity, Class II, BMI 35-39.9   . PONV (postoperative nausea and vomiting)     Past Surgical History:  Procedure Laterality Date  . CHOLECYSTECTOMY N/A 02/18/2013   Procedure: LAPAROSCOPIC CHOLECYSTECTOMY WITH INTRAOPERATIVE CHOLANGIOGRAM;  Surgeon: Pedro Earls, MD;  Location: [facility];  Service: General;  Laterality: N/A;  . COLONOSCOPY    . CORONARY ANGIOPLASTY WITH STENT PLACEMENT  04/30/2007   2.5 mm Promus stent that was to 95% Circ;had 60-70% lesion to RCA  . DOPPLER ECHOCARDIOGRAPHY  05/27/2002   [facility]normal EF 55-66%,  . HEEL SPUR SURGERY    . HEMORRHOID SURGERY    . hip surgery     complete  . Holter Monitor  04/07/2007   sinus tachy.;  . KNEE ARTHROSCOPY  03/27/2012   Procedure: ARTHROSCOPY KNEE;  Surgeon: Gearlean Alf, MD;  Location: [facility];  Service: Orthopedics;  Laterality: Left;  . NM MYOCAR PERF WALL MOTION  12/19/2011   EXERCISED FOR 8-1/2 MINUTES RECHING 10 METABOLIC EQUIVALENTS. NO EVIDENCE  OF ISCHEMIA OR INFARCTION . EF 71%  . renal dopplers  04/08/2007   relatively normal  . TOTAL KNEE ARTHROPLASTY Left 09/04/2012   Procedure: TOTAL KNEE ARTHROPLASTY;  Surgeon: Gearlean Alf, MD;  Location: [facility];  Service: Orthopedics;  Laterality: Left;    Current Medications: Outpatient Medications Prior to Visit  Medication Sig Dispense Refill  . clopidogrel (PLAVIX) 75 MG tablet TAKE 1 TABLET (75 MG TOTAL)  BY MOUTH DAILY. 90 tablet 3  . DULoxetine (CYMBALTA) 60 MG capsule TAKE 1 CAPSULE (60 MG TOTAL) BY MOUTH EVERY MORNING. 90 capsule 3  . furosemide (LASIX) 20 MG tablet Take 1 tablet (20 mg total) by mouth daily. 90 tablet 3  . hydrochlorothiazide (HYDRODIURIL) 25 MG tablet Take 1 tablet (25 mg total) by mouth daily. 30 tablet 11  . losartan (COZAAR) 100 MG tablet Take 100 mg by mouth daily.    . metoprolol succinate (TOPROL-XL) 50 MG 24 hr tablet TAKE 1 TABLET (50 MG TOTAL) BY MOUTH DAILY. 90 tablet 3  .  pantoprazole (PROTONIX) 40 MG tablet TAKE 1 TABLET BY MOUTH EVERY DAY 30 TO 60 MINUTES BEFORE FIRST MEAL 90 tablet 0  . potassium chloride 20 MEQ TBCR Take 20 mEq by mouth daily. (Patient not taking: Reported on 03/25/2016) 7 tablet 0   No facility-administered medications prior to visit.      Allergies:   Crestor [rosuvastatin]; Oxycodone; Prednisone; Valsartan; and Warfarin and related   Social History   Social History  . Marital status: Married    Spouse name: N/A  . Number of children: 1  . Years of education: N/A   Occupational History  .  [employer]   Social History Main Topics  . Smoking status: Never Smoker  . Smokeless tobacco: Never Used  . Alcohol use No  . Drug use: No  . Sexual activity: Not Asked   Other Topics Concern  . None   Social History Narrative   Very little 0-2 drinks a week     Family History:  The patient's family history includes Breast cancer in his mother; Diabetes in his mother; Prostate cancer in his father.   ROS:   Please see the history of present illness.    ROS All other systems reviewed and are negative.   PHYSICAL EXAM:   VS:  BP (!) 140/91 (BP Location: Right Arm, Patient Position: Sitting, Cuff Size: Large)   Pulse 86   Ht 5\' 11"  (1.803 m)   Wt 274 lb 3.2 oz (124.4 kg)   SpO2 96%   BMI 38.24 kg/m    GEN: A very obese but also quite muscular, well developed, in no acute distress  HEENT: normal  Neck: no JVD, carotid bruits, or masses Cardiac: RRR; no murmurs, rubs, or gallops,no edema  Respiratory:  clear to auscultation bilaterally, normal work of breathing GI: soft, nontender, nondistended, + BS. Prominent abdominal obesity. MS: no deformity or atrophy  Skin: warm and dry, no rash Neuro:  Alert and Oriented x 3, Strength and sensation are intact Psych: euthymic mood, full affect  Wt Readings from Last 3 Encounters:  03/25/16 274 lb 3.2 oz (124.4 kg)  12/07/15 274 lb (124.3 kg)  12/01/15 278 lb (126.1 kg)       Studies/Labs Reviewed:   EKG:  EKG is not ordered today.    Recent Labs: 09/21/2015: ALT 31 12/09/2015: BUN 18; Creatinine, Ser 1.00; Hemoglobin 15.6; Potassium 3.3; Sodium 140   Lipid Panel    Component Value Date/Time   CHOL 177 09/21/2015 1410   TRIG 190 (H) 09/21/2015 1410   HDL 36 (L) 09/21/2015 1410   CHOLHDL 4.9 09/21/2015 1410   VLDL 38 (H) 09/21/2015 1410   LDLCALC 103 09/21/2015 1410      ASSESSMENT:    1. Essential hypertension   2. CAD S/P percutaneous coronary angioplasty   3. Hyperlipidemia with target LDL less than 70; statin intolerant   4.  Severe obesity (BMI 35.0-35.9 with comorbidity) (Olivia Lopez de Gutierrez)      PLAN:  In order of problems listed above:  1. HTN: Borderline acceptable blood pressure control. We once again reviewed weight loss, regular exercise and sodium restriction. Will add hydrochlorothiazide to his losartan continue metoprolol as well. Make sure he uses a large blood pressure cuff 2. CAD: Currently asymptomatic 3. HLP: His HDL is low and triglycerides are high, both suggesting that he has a high-risk metabolic pattern, both reinforcing the importance of weight loss and regular physical exercise. His LDL is actually not that bad, but is likely to have small dense LDL particles. He refuses to try statins again. 4. Obesity: He would benefit from weight loss in multiple ways. Reviewed calorie restriction and regular physical activity. Discussed a diet rich in protein and high value fat, low in saturated fat and sugars and starches.    Medication Adjustments/Labs and Tests Ordered: Current medicines are reviewed at length with the patient today.  Concerns regarding medicines are outlined above.  Medication changes, Labs and Tests ordered today are listed in the Patient Instructions below. Patient Instructions  Dr Sallyanne Kuster recommends that you schedule a follow-up appointment in 6 months. You will receive a reminder letter in the mail two months in  advance. If you don't receive a letter, please call our office to schedule the follow-up appointment.  If you need a refill on your cardiac medications before your next appointment, please call your pharmacy.   Your physician encouraged you to lose weight for better health.  Your physician has requested that you regularly monitor and record your blood pressure readings at home. Please use the same machine at the same time of day to check your readings and record them to bring to your follow-up visit. Please communicate these with [place] by either using mychart, mail, or calling to speak with a nurse    Signed, Sanda Klein, MD  03/25/2016 9:52 AM    [address], [place], [place]  [address] Phone: [phone]; Fax: [phone]

## 2016-05-24 ENCOUNTER — Other Ambulatory Visit: Payer: Self-pay | Admitting: Internal Medicine

## 2016-06-05 ENCOUNTER — Other Ambulatory Visit: Payer: Self-pay | Admitting: Internal Medicine

## 2016-06-10 ENCOUNTER — Ambulatory Visit (INDEPENDENT_AMBULATORY_CARE_PROVIDER_SITE_OTHER): Payer: Medicare Other | Admitting: Physician Assistant

## 2016-06-10 ENCOUNTER — Ambulatory Visit (INDEPENDENT_AMBULATORY_CARE_PROVIDER_SITE_OTHER): Payer: Medicare Other

## 2016-06-10 VITALS — BP 130/80 | HR 108 | Temp 98.3°F | Resp 17 | Ht 72.5 in | Wt 279.0 lb

## 2016-06-10 DIAGNOSIS — R059 Cough, unspecified: Secondary | ICD-10-CM

## 2016-06-10 DIAGNOSIS — K219 Gastro-esophageal reflux disease without esophagitis: Secondary | ICD-10-CM | POA: Diagnosis not present

## 2016-06-10 DIAGNOSIS — R05 Cough: Secondary | ICD-10-CM | POA: Diagnosis not present

## 2016-06-10 DIAGNOSIS — Z9861 Coronary angioplasty status: Secondary | ICD-10-CM

## 2016-06-10 DIAGNOSIS — I2510 Atherosclerotic heart disease of native coronary artery without angina pectoris: Secondary | ICD-10-CM

## 2016-06-10 LAB — POCT CBC
Granulocyte percent: 68.9 %G (ref 37–80)
HCT, POC: 40.8 % — AB (ref 43.5–53.7)
Hemoglobin: 15 g/dL (ref 14.1–18.1)
Lymph, poc: 2.8 (ref 0.6–3.4)
MCH, POC: 30 pg (ref 27–31.2)
MCHC: 36.7 g/dL — AB (ref 31.8–35.4)
MCV: 81.6 fL (ref 80–97)
MID (cbc): 0.2 (ref 0–0.9)
MPV: 7 fL (ref 0–99.8)
POC Granulocyte: 6.8 (ref 2–6.9)
POC LYMPH PERCENT: 28.6 % (ref 10–50)
POC MID %: 2.5 % (ref 0–12)
Platelet Count, POC: 211 10*3/uL (ref 142–424)
RBC: 5 M/uL (ref 4.69–6.13)
RDW, POC: 14 %
WBC: 9.9 10*3/uL (ref 4.6–10.2)

## 2016-06-10 MED ORDER — HYDROCODONE-HOMATROPINE 5-1.5 MG/5ML PO SYRP: 5.0000 mL | ORAL_SOLUTION | Freq: Three times a day (TID) | ORAL | 0 refills | Status: DC | PRN

## 2016-06-10 MED ORDER — BENZONATATE 100 MG PO CAPS: 100.0000 mg | ORAL_CAPSULE | Freq: Three times a day (TID) | ORAL | 0 refills | Status: DC | PRN

## 2016-06-10 MED ORDER — AZITHROMYCIN 250 MG PO TABS: ORAL_TABLET | ORAL | 0 refills | Status: DC

## 2016-06-10 MED ORDER — PANTOPRAZOLE SODIUM 40 MG PO TBEC: DELAYED_RELEASE_TABLET | ORAL | 0 refills | Status: DC

## 2016-06-10 MED ORDER — ALBUTEROL SULFATE HFA 108 (90 BASE) MCG/ACT IN AERS: 2.0000 | INHALATION_SPRAY | RESPIRATORY_TRACT | 1 refills | Status: DC | PRN

## 2016-06-10 MED ORDER — MUCINEX DM MAXIMUM STRENGTH 60-1200 MG PO TB12
1.0000 | ORAL_TABLET | Freq: Two times a day (BID) | ORAL | 1 refills | Status: DC
Start: 2016-06-10 — End: 2016-07-12

## 2016-06-10 NOTE — Progress Notes (Signed)
Samuel Chambers  MRN: [id number] DOB: 09-17-1950  PCP: Sanda Klein, MD  Subjective:  Pt is a 65 year old male presents to clinic for SOB, URI and medication refill.  Cough x one month. Cough is productive, feels better when he coughs up phlegm. Cough is worse at night, reports wheezing at night.  Notes occasional chest tightness. Not sleeping well due to cough.  Has "taken everything over the counter there is to take". His cough is getting worse.  Denies fever, chills, congestion, chest pain, SOB.   No known sick contacts.   Medication refill - Pantoprazole 40 mg. History of GERD, well controlled. He does not have a PCP right now and would like to establish care at [facility].   Review of Systems  Constitutional: Negative for chills, diaphoresis, fatigue and fever.  HENT: Negative for congestion, postnasal drip, rhinorrhea, sneezing, sore throat and trouble swallowing.   Respiratory: Positive for cough, shortness of breath and wheezing. Negative for chest tightness.   Cardiovascular: Negative for chest pain, palpitations and leg swelling.  Gastrointestinal: Negative for abdominal pain, diarrhea, nausea and vomiting.  Neurological: Negative for dizziness, syncope, light-headedness and headaches.  Psychiatric/Behavioral: Negative for sleep disturbance. The patient is not nervous/anxious.     Patient Active Problem List   Diagnosis Date Noted  . Chest pain with moderate risk for cardiac etiology 11/15/2015  . Upper airway cough syndrome 06/06/2015  . Severe obesity (BMI 35.0-35.9 with comorbidity) ([place]) 08/04/2014  . Hyperlipidemia with target LDL less than 70; statin intolerant 04/29/2013  . Encounter for long-term (current) use of other medications 04/29/2013  . CAD S/P percutaneous coronary angioplasty   . S/P laparoscopic cholecystectomy July 2014 02/18/2013  . Gallstones 01/28/2013  . OA (osteoarthritis) of knee 09/04/2012  . Acute medial meniscal tear 03/27/2012  . ANXIETY  02/24/2007  . Essential hypertension 02/24/2007  . GERD 02/24/2007  . DEGENERATIVE JOINT DISEASE, GENERALIZED 02/24/2007  . CHEST PAIN 02/24/2007    Current Outpatient Prescriptions on File Prior to Visit  Medication Sig Dispense Refill  . clopidogrel (PLAVIX) 75 MG tablet TAKE 1 TABLET (75 MG TOTAL) BY MOUTH DAILY. 90 tablet 3  . DULoxetine (CYMBALTA) 60 MG capsule TAKE 1 CAPSULE (60 MG TOTAL) BY MOUTH EVERY MORNING. 90 capsule 3  . furosemide (LASIX) 20 MG tablet Take 1 tablet (20 mg total) by mouth daily. 90 tablet 3  . hydrochlorothiazide (HYDRODIURIL) 25 MG tablet Take 1 tablet (25 mg total) by mouth daily. 30 tablet 11  . losartan (COZAAR) 100 MG tablet Take 100 mg by mouth daily.    . metoprolol succinate (TOPROL-XL) 50 MG 24 hr tablet TAKE 1 TABLET (50 MG TOTAL) BY MOUTH DAILY. 90 tablet 3  . pantoprazole (PROTONIX) 40 MG tablet TAKE 1 TABLET BY MOUTH EVERY DAY 30 TO 60 MINUTES BEFORE FIRST MEAL (Patient not taking: Reported on 06/10/2016) 90 tablet 0   No current facility-administered medications on file prior to visit.     Allergies  Allergen Reactions  . Crestor [Rosuvastatin] Other (See Comments)    myalgias  . Oxycodone Other (See Comments)    Hallucinations-ok in small doses  . Prednisone Other (See Comments)    Increased BP  . Valsartan Other (See Comments)    Made patient feel tired and no energy  . Warfarin And Related Other (See Comments)    Fast heart beat, went down hill, felt like he was dying     Objective:  BP 130/80 (BP Location: Right Arm, Patient  Position: Sitting, Cuff Size: Normal)   Pulse (!) 108   Temp 98.3 F (36.8 C) (Oral)   Resp 17   Ht 6' 0.5" (1.842 m)   Wt 279 lb (126.6 kg)   SpO2 96%   BMI 37.32 kg/m   Physical Exam  Constitutional: He is oriented to person, place, and time and well-developed, well-nourished, and in no distress. No distress.  Cardiovascular: Normal rate, regular rhythm and normal heart sounds.   Pulmonary/Chest:  Effort normal. He has no wheezes. He has rhonchi in the left upper field. He has no rales.  Neurological: He is alert and oriented to person, place, and time. GCS score is 15.  Skin: Skin is warm and dry.  Psychiatric: Mood, memory, affect and judgment normal.  Vitals reviewed.  Results for orders placed or performed in visit on 06/10/16  POCT CBC  Result Value Ref Range   WBC 9.9 4.6 - 10.2 K/uL   Lymph, poc 2.8 0.6 - 3.4   POC LYMPH PERCENT 28.6 10 - 50 %L   MID (cbc) 0.2 0 - 0.9   POC MID % 2.5 0 - 12 %M   POC Granulocyte 6.8 2 - 6.9   Granulocyte percent 68.9 37 - 80 %G   RBC 5.00 4.69 - 6.13 M/uL   Hemoglobin 15.0 14.1 - 18.1 g/dL   HCT, POC 40.8 (A) 43.5 - 53.7 %   MCV 81.6 80 - 97 fL   MCH, POC 30.0 27 - 31.2 pg   MCHC 36.7 (A) 31.8 - 35.4 g/dL   RDW, POC 14.0 %   Platelet Count, POC 211 142 - 424 K/uL   MPV 7.0 0 - 99.8 fL   Dg Chest 2 View  Result Date: 06/10/2016 CLINICAL DATA:  Cough for 1 month EXAM: CHEST  2 VIEW COMPARISON:  May 19, 2015 FINDINGS: There is no edema or consolidation. The heart size and pulmonary vascularity are normal. No adenopathy. No bone lesions. IMPRESSION: No edema or consolidation. Electronically Signed   By: Lowella Grip III M.D.   On: 06/10/2016 08:46    Assessment and Plan :  1. Cough 2. Gastroesophageal reflux disease without esophagitis - POCT CBC - DG Chest 2 View; Future - benzonatate (TESSALON) 100 MG capsule; Take 1-2 capsules (100-200 mg total) by mouth 3 (three) times daily as needed for cough.  Dispense: 40 capsule; Refill: 0 - azithromycin (ZITHROMAX) 250 MG tablet; Take 2 tabs PO x 1 dose, then 1 tab PO QD x 4 days  Dispense: 6 tablet; Refill: 0 - HYDROcodone-homatropine (HYCODAN) 5-1.5 MG/5ML syrup; Take 5 mLs by mouth every 8 (eight) hours as needed for cough.  Dispense: 120 mL; Refill: 0 - Dextromethorphan-Guaifenesin (MUCINEX DM MAXIMUM STRENGTH) 60-1200 MG TB12; Take 1 tablet by mouth every 12 (twelve) hours.   Dispense: 14 each; Refill: 1 - albuterol (PROVENTIL HFA;VENTOLIN HFA) 108 (90 Base) MCG/ACT inhaler; Inhale 2 puffs into the lungs every 4 (four) hours as needed for wheezing or shortness of breath (cough, shortness of breath or wheezing.).  Dispense: 1 Inhaler; Refill: 1 - pantoprazole (PROTONIX) 40 MG tablet; TAKE 1 TABLET BY MOUTH EVERY DAY 30 TO 60 MINUTES BEFORE FIRST MEAL  Dispense: 90 tablet; Refill: 0 - Will treat empirically, as he has had worsening cough x four weeks. GERD may be contributing to his symptoms. Encouraged patient to start Protonix as soon as possible, as this may help with symptoms.  RTC in 7-10 days if no improvement.    Whitney  Khadim Lundberg, PA-C  [facility] 06/10/2016 8:22 AM

## 2016-06-10 NOTE — Patient Instructions (Addendum)
Please take hot showers to help cough up your phlegm. Mucinex will help, as this is an expectorant and will get the crud out.  Tessalon pearls are for cough during the day. Hycodan is for cough at night, as this may make you drowsy.  Please call in 1-2 weeks if you are not seeing improvement. Bronchitis can sometimes hang on for a while, so as long as you are improving, this is what we want.  Please take your entire course of antibiotics, do not stop if you are feeling better.    IF you received an x-ray today, you will receive an invoice from [facility]. Please contact [facility] at [phone] with questions or concerns regarding your invoice.   IF you received labwork today, you will receive an invoice from [employer]. Please contact [facility] at [phone] with questions or concerns regarding your invoice.   Our billing staff will not be able to assist you with questions regarding bills from these companies.  You will be contacted with the lab results as soon as they are available. The fastest way to get your results is to activate your My Chart account. Instructions are located on the last page of this paperwork. If you have not heard from [place] regarding the results in 2 weeks, please contact this office.

## 2016-07-12 ENCOUNTER — Ambulatory Visit (INDEPENDENT_AMBULATORY_CARE_PROVIDER_SITE_OTHER): Payer: Medicare Other | Admitting: Physician Assistant

## 2016-07-12 VITALS — BP 138/78 | HR 91 | Temp 98.6°F | Resp 17 | Ht 72.5 in | Wt 273.0 lb

## 2016-07-12 DIAGNOSIS — H9201 Otalgia, right ear: Secondary | ICD-10-CM

## 2016-07-12 DIAGNOSIS — R51 Headache: Secondary | ICD-10-CM

## 2016-07-12 DIAGNOSIS — R05 Cough: Secondary | ICD-10-CM | POA: Diagnosis not present

## 2016-07-12 DIAGNOSIS — H6591 Unspecified nonsuppurative otitis media, right ear: Secondary | ICD-10-CM

## 2016-07-12 DIAGNOSIS — R059 Cough, unspecified: Secondary | ICD-10-CM

## 2016-07-12 DIAGNOSIS — I2510 Atherosclerotic heart disease of native coronary artery without angina pectoris: Secondary | ICD-10-CM

## 2016-07-12 DIAGNOSIS — Z9861 Coronary angioplasty status: Secondary | ICD-10-CM

## 2016-07-12 DIAGNOSIS — R519 Headache, unspecified: Secondary | ICD-10-CM

## 2016-07-12 LAB — POCT CBC
Granulocyte percent: 70.5 %G (ref 37–80)
HCT, POC: 41.2 % — AB (ref 43.5–53.7)
Hemoglobin: 14.6 g/dL (ref 14.1–18.1)
Lymph, poc: 3.1 (ref 0.6–3.4)
MCH, POC: 29.9 pg (ref 27–31.2)
MCHC: 35.4 g/dL (ref 31.8–35.4)
MCV: 84.2 fL (ref 80–97)
MID (cbc): 0.3 (ref 0–0.9)
MPV: 7.5 fL (ref 0–99.8)
POC Granulocyte: 8.2 — AB (ref 2–6.9)
POC LYMPH PERCENT: 27 %L (ref 10–50)
POC MID %: 2.5 % (ref 0–12)
Platelet Count, POC: 249 10*3/uL (ref 142–424)
RBC: 4.89 M/uL (ref 4.69–6.13)
RDW, POC: 14.4 %
WBC: 11.6 10*3/uL — AB (ref 4.6–10.2)

## 2016-07-12 MED ORDER — AMOXICILLIN 500 MG PO CAPS: 500.0000 mg | ORAL_CAPSULE | Freq: Two times a day (BID) | ORAL | 0 refills | Status: AC

## 2016-07-12 MED ORDER — HYDROCOD POLST-CPM POLST ER 10-8 MG/5ML PO SUER: 5.0000 mL | Freq: Two times a day (BID) | ORAL | 0 refills | Status: DC | PRN

## 2016-07-12 NOTE — Progress Notes (Signed)
Samuel Chambers  MRN: [id number] DOB: 1951-01-19  PCP: Sanda Klein, MD  Subjective:  Pt is a pleasant 65 year old male who presents to clinic for persistant cough. He is here today with his wife. He would like to establish care here at [facility], as his previous PCP "went on sabbatical"   He was here one month ago for cough. At that time he c/o cough for one month. Treated with Tessalon, Hycodan, Mucinex, Albuterol, Protonix and Zithromax.  Hycodan and Tessalon pearls didn't help at all. He hasn't used Albuterol regularly. Mucinex helped some.  He "got about 80% better". Today he says he feels like he is going back down hill and is on his way to getting worse. C/o non-productive cough, chest tightness, deep breaths bring on cough. Worse at night and in the morning, keeps him up at night. +Headache, ear pain. One episode of vomiting all night earlier this week, believes he had "the stomach bug". Think this has contributed to irritated sore throat and cough.   Denies fever, chills, congestion, chest pain, palpitations, coughing up blood.  Review of Systems  Constitutional: Negative for chills, diaphoresis and fever.  HENT: Positive for ear pain, sinus pressure and sore throat. Negative for congestion, ear discharge, facial swelling, postnasal drip, rhinorrhea, sinus pain, sneezing, trouble swallowing and voice change.   Respiratory: Positive for cough. Negative for chest tightness, shortness of breath and wheezing.   Cardiovascular: Negative for chest pain and palpitations.  Gastrointestinal: Positive for vomiting. Negative for abdominal pain, diarrhea and nausea.  Neurological: Positive for headaches. Negative for dizziness, syncope and light-headedness.  Psychiatric/Behavioral: Positive for sleep disturbance.    Patient Active Problem List   Diagnosis Date Noted  . Chest pain with moderate risk for cardiac etiology 11/15/2015  . Upper airway cough syndrome 06/06/2015  . Severe obesity (BMI  35.0-35.9 with comorbidity) ([place]) 08/04/2014  . Hyperlipidemia with target LDL less than 70; statin intolerant 04/29/2013  . CAD S/P percutaneous coronary angioplasty   . S/P laparoscopic cholecystectomy July 2014 02/18/2013  . Gallstones 01/28/2013  . OA (osteoarthritis) of knee 09/04/2012  . Acute medial meniscal tear 03/27/2012  . ANXIETY 02/24/2007  . Essential hypertension 02/24/2007  . GERD 02/24/2007  . DEGENERATIVE JOINT DISEASE, GENERALIZED 02/24/2007    Current Outpatient Prescriptions on File Prior to Visit  Medication Sig Dispense Refill  . albuterol (PROVENTIL HFA;VENTOLIN HFA) 108 (90 Base) MCG/ACT inhaler Inhale 2 puffs into the lungs every 4 (four) hours as needed for wheezing or shortness of breath (cough, shortness of breath or wheezing.). 1 Inhaler 1  . benzonatate (TESSALON) 100 MG capsule Take 1-2 capsules (100-200 mg total) by mouth 3 (three) times daily as needed for cough. 40 capsule 0  . clopidogrel (PLAVIX) 75 MG tablet TAKE 1 TABLET (75 MG TOTAL) BY MOUTH DAILY. 90 tablet 3  . DULoxetine (CYMBALTA) 60 MG capsule TAKE 1 CAPSULE (60 MG TOTAL) BY MOUTH EVERY MORNING. 90 capsule 3  . furosemide (LASIX) 20 MG tablet Take 1 tablet (20 mg total) by mouth daily. 90 tablet 3  . hydrochlorothiazide (HYDRODIURIL) 25 MG tablet Take 1 tablet (25 mg total) by mouth daily. 30 tablet 11  . HYDROcodone-homatropine (HYCODAN) 5-1.5 MG/5ML syrup Take 5 mLs by mouth every 8 (eight) hours as needed for cough. 120 mL 0  . losartan (COZAAR) 100 MG tablet Take 100 mg by mouth daily.    . metoprolol succinate (TOPROL-XL) 50 MG 24 hr tablet TAKE 1 TABLET (50 MG TOTAL)  BY MOUTH DAILY. 90 tablet 3  . pantoprazole (PROTONIX) 40 MG tablet TAKE 1 TABLET BY MOUTH EVERY DAY 30 TO 60 MINUTES BEFORE FIRST MEAL 90 tablet 0   No current facility-administered medications on file prior to visit.     Allergies  Allergen Reactions  . Crestor [Rosuvastatin] Other (See Comments)    myalgias  .  Oxycodone Other (See Comments)    Hallucinations-ok in small doses  . Prednisone Other (See Comments)    Increased BP  . Valsartan Other (See Comments)    Made patient feel tired and no energy  . Warfarin And Related Other (See Comments)    Fast heart beat, went down hill, felt like he was dying     Objective:  BP 138/78 (BP Location: Right Arm, Patient Position: Sitting, Cuff Size: Large)   Pulse 91   Temp 98.6 F (37 C) (Oral)   Resp 17   Ht 6' 0.5" (1.842 m)   Wt 273 lb (123.8 kg)   SpO2 94%   BMI 36.52 kg/m   Physical Exam  Constitutional: He is oriented to person, place, and time and well-developed, well-nourished, and in no distress. No distress.  HENT:  Right Ear: Tympanic membrane is injected and bulging.  Left Ear: Tympanic membrane is bulging.  Nose: Right sinus exhibits no maxillary sinus tenderness and no frontal sinus tenderness. Left sinus exhibits no maxillary sinus tenderness and no frontal sinus tenderness.  Mouth/Throat: Posterior oropharyngeal edema present. No oropharyngeal exudate or posterior oropharyngeal erythema.  Cardiovascular: Normal rate, regular rhythm and normal heart sounds.   Pulmonary/Chest: Effort normal and breath sounds normal. No respiratory distress. He has no wheezes. He has no rales.  Neurological: He is alert and oriented to person, place, and time. GCS score is 15.  Skin: Skin is warm and dry.  Psychiatric: Mood, memory, affect and judgment normal.  Vitals reviewed.   Results for orders placed or performed in visit on 07/12/16  POCT CBC  Result Value Ref Range   WBC 11.6 (A) 4.6 - 10.2 K/uL   Lymph, poc 3.1 0.6 - 3.4   POC LYMPH PERCENT 27.0 10 - 50 %L   MID (cbc) 0.3 0 - 0.9   POC MID % 2.5 0 - 12 %M   POC Granulocyte 8.2 (A) 2 - 6.9   Granulocyte percent 70.5 37 - 80 %G   RBC 4.89 4.69 - 6.13 M/uL   Hemoglobin 14.6 14.1 - 18.1 g/dL   HCT, POC 41.2 (A) 43.5 - 53.7 %   MCV 84.2 80 - 97 fL   MCH, POC 29.9 27 - 31.2 pg    MCHC 35.4 31.8 - 35.4 g/dL   RDW, POC 14.4 %   Platelet Count, POC 249 142 - 424 K/uL   MPV 7.5 0 - 99.8 fL    Assessment and Plan :  1. Right non-suppurative otitis media 2. Cough 3. Right ear pain 4. Nonintractable headache, unspecified chronicity pattern, unspecified headache type - amoxicillin (AMOXIL) 500 MG capsule; Take 1 capsule (500 mg total) by mouth 2 (two) times daily.  Dispense: 20 capsule; Refill: 0 - POCT CBC - chlorpheniramine-HYDROcodone (TUSSIONEX PENNKINETIC ER) 10-8 MG/5ML SUER; Take 5 mLs by mouth every 12 (twelve) hours as needed for cough.  Dispense: 100 mL; Refill: 0 - Patient present for persistent cough x two months. Lab work shows leukocytosis with left shift. Lungs are clear. He does not want prednisone due to a previous bad reaction, does not want breathing treatment  as "they haven't done a darn thing in the past". Advised Albuterol 2 puffs q 4-6 hrs for 5-7 days. Will treat for AOM. RTC in 5-7 days if no improvement.   Mercer Pod, PA-C  [facility] 07/12/2016 6:44 PM

## 2016-07-12 NOTE — Patient Instructions (Addendum)
  Please use your albuterol inhaler 2 puffs every 4-6 hours for one week. After that use as needed. Place a humidifier in your room at night while you sleep. Please come back if you are not better in 5-7 days. Take ENTIRE COURSE of antibiotics even if you start feeling better.   Thank you for coming in today. I hope you feel we met your needs.  Feel free to call [facility] if you have any questions or further requests.  Please consider signing up for MyChart if you do not already have it, as this is a great way to communicate with me.  Best,  Whitney McVey, PA-C   IF you received an x-ray today, you will receive an invoice from [facility]. Please contact [facility] at [phone] with questions or concerns regarding your invoice.   IF you received labwork today, you will receive an invoice from [facility]. Please contact [facility] at [phone] with questions or concerns regarding your invoice.   Our billing staff will not be able to assist you with questions regarding bills from these companies.  You will be contacted with the lab results as soon as they are available. The fastest way to get your results is to activate your My Chart account. Instructions are located on the last page of this paperwork. If you have not heard from [place] regarding the results in 2 weeks, please contact this office.

## 2016-08-21 ENCOUNTER — Other Ambulatory Visit: Payer: Self-pay

## 2016-08-21 MED ORDER — LOSARTAN POTASSIUM 100 MG PO TABS: 100.0000 mg | ORAL_TABLET | Freq: Every day | ORAL | 0 refills | Status: DC

## 2016-09-02 ENCOUNTER — Ambulatory Visit (INDEPENDENT_AMBULATORY_CARE_PROVIDER_SITE_OTHER): Payer: Medicare Other | Admitting: Physician Assistant

## 2016-09-02 ENCOUNTER — Encounter: Payer: Self-pay | Admitting: Physician Assistant

## 2016-09-02 VITALS — BP 143/85 | HR 88 | Temp 98.0°F | Resp 16 | Ht 72.0 in | Wt 276.0 lb

## 2016-09-02 DIAGNOSIS — E785 Hyperlipidemia, unspecified: Secondary | ICD-10-CM

## 2016-09-02 DIAGNOSIS — Z114 Encounter for screening for human immunodeficiency virus [HIV]: Secondary | ICD-10-CM

## 2016-09-02 DIAGNOSIS — Z131 Encounter for screening for diabetes mellitus: Secondary | ICD-10-CM | POA: Diagnosis not present

## 2016-09-02 DIAGNOSIS — Z0000 Encounter for general adult medical examination without abnormal findings: Secondary | ICD-10-CM

## 2016-09-02 DIAGNOSIS — Z23 Encounter for immunization: Secondary | ICD-10-CM

## 2016-09-02 DIAGNOSIS — R3912 Poor urinary stream: Secondary | ICD-10-CM

## 2016-09-02 DIAGNOSIS — M797 Fibromyalgia: Secondary | ICD-10-CM | POA: Diagnosis not present

## 2016-09-02 DIAGNOSIS — Z1159 Encounter for screening for other viral diseases: Secondary | ICD-10-CM

## 2016-09-02 MED ORDER — ZOSTER VACCINE LIVE 19400 UNT/0.65ML ~~LOC~~ SUSR: 0.6500 mL | Freq: Once | SUBCUTANEOUS | 0 refills | Status: AC

## 2016-09-02 MED ORDER — GABAPENTIN 300 MG PO CAPS: 300.0000 mg | ORAL_CAPSULE | Freq: Three times a day (TID) | ORAL | 0 refills | Status: DC

## 2016-09-02 MED ORDER — ZOSTER VACCINE LIVE 19400 UNT/0.65ML ~~LOC~~ SUSR: 0.6500 mL | Freq: Once | SUBCUTANEOUS | 0 refills | Status: DC

## 2016-09-02 NOTE — Patient Instructions (Addendum)
Fibromyalgia - Slowly d/c Cymbalta. Start Gabapentin 300mg  once daily for one day, then 300 twice a day for one day; then 300 mg three times a day .  Come back and see me in 1-2 weeks. We will adjust your dose and/or add another medication.  Please work hard on eating a healthy diet - see below for information.  Please cut back on fast food and soda. Start drinking more water. You can use Agave Nectar as a sweetener.    Floss every day.   Take your Rx for Shingles to your pharmacy.   Please schedule f/u appt with carrdiologist: Sanda Klein, MD- [address], [place], [place]  [address] Phone: [phone]  [place] stands for "Dietary Approaches to Stop Hypertension." The DASH eating plan is a healthy eating plan that has been shown to reduce high blood pressure (hypertension). Additional health benefits may include reducing the risk of type 2 diabetes mellitus, heart disease, and stroke. The DASH eating plan may also help with weight loss. What do I need to know about the DASH eating plan? For the DASH eating plan, you will follow these general guidelines:  Choose foods with less than 150 milligrams of sodium per serving (as listed on the food label).  Use salt-free seasonings or herbs instead of table salt or sea salt.  Check with your health care provider or pharmacist before using salt substitutes.  Eat lower-sodium products. These are often labeled as "low-sodium" or "no salt added."  Eat fresh foods. Avoid eating a lot of canned foods.  Eat more vegetables, fruits, and low-fat dairy products.  Choose whole grains. Look for the word "whole" as the first word in the ingredient list.  Choose fish and skinless chicken or [place] more often than red meat. Limit fish, poultry, and meat to 6 oz (170 g) each day.  Limit sweets, desserts, sugars, and sugary drinks.  Choose heart-healthy fats.  Eat more home-cooked food and less  restaurant, buffet, and fast food.  Limit fried foods.  Do not fry foods. Cook foods using methods such as baking, boiling, grilling, and broiling instead.  When eating at a restaurant, ask that your food be prepared with less salt, or no salt if possible. What foods can I eat? Seek help from a dietitian for individual calorie needs. Grains  Whole grain or whole wheat bread. Brown rice. Whole grain or whole wheat pasta. Quinoa, bulgur, and whole grain cereals. Low-sodium cereals. Corn or whole wheat flour tortillas. Whole grain cornbread. Whole grain crackers. Low-sodium crackers. Vegetables  Fresh or frozen vegetables (raw, steamed, roasted, or grilled). Low-sodium or reduced-sodium tomato and vegetable juices. Low-sodium or reduced-sodium tomato sauce and paste. Low-sodium or reduced-sodium canned vegetables. Fruits  All fresh, canned (in natural juice), or frozen fruits. Meat and Other Protein Products  Ground beef (85% or leaner), grass-fed beef, or beef trimmed of fat. Skinless chicken or [place]. Ground chicken or [place]. Pork trimmed of fat. All fish and seafood. Eggs. Dried beans, peas, or lentils. Unsalted nuts and seeds. Unsalted canned beans. Dairy  Low-fat dairy products, such as skim or 1% milk, 2% or reduced-fat cheeses, low-fat ricotta or cottage cheese, or plain low-fat yogurt. Low-sodium or reduced-sodium cheeses. Fats and Oils  Tub margarines without trans fats. Light or reduced-fat mayonnaise and salad dressings (reduced sodium). Avocado. Safflower, olive, or canola oils. Natural peanut or almond butter. Other  Unsalted popcorn and pretzels. The items listed above may not  be a complete list of recommended foods or beverages. Contact your dietitian for more options.  What foods are not recommended? Grains  White bread. White pasta. White rice. Refined cornbread. Bagels and croissants. Crackers that contain trans fat. Vegetables  Creamed or fried vegetables. Vegetables in  a cheese sauce. Regular canned vegetables. Regular canned tomato sauce and paste. Regular tomato and vegetable juices. Fruits  Canned fruit in light or heavy syrup. Fruit juice. Meat and Other Protein Products  Fatty cuts of meat. Ribs, chicken wings, bacon, sausage, bologna, salami, chitterlings, fatback, hot dogs, bratwurst, and packaged luncheon meats. Salted nuts and seeds. Canned beans with salt. Dairy  Whole or 2% milk, cream, half-and-half, and cream cheese. Whole-fat or sweetened yogurt. Full-fat cheeses or blue cheese. Nondairy creamers and whipped toppings. Processed cheese, cheese spreads, or cheese curds. Condiments  Onion and garlic salt, seasoned salt, table salt, and sea salt. Canned and packaged gravies. Worcestershire sauce. Tartar sauce. Barbecue sauce. Teriyaki sauce. Soy sauce, including reduced sodium. Steak sauce. Fish sauce. Oyster sauce. Cocktail sauce. Horseradish. Ketchup and mustard. Meat flavorings and tenderizers. Bouillon cubes. Hot sauce. Tabasco sauce. Marinades. Taco seasonings. Relishes. Fats and Oils  Butter, stick margarine, lard, shortening, ghee, and bacon fat. Coconut, palm kernel, or palm oils. Regular salad dressings. Other  Pickles and olives. Salted popcorn and pretzels. The items listed above may not be a complete list of foods and beverages to avoid. Contact your dietitian for more information.  Where can I find more information? National Heart, Lung, and Blood Institute: travelstabloid.com This information is not intended to replace advice given to you by your health care provider. Make sure you discuss any questions you have with your health care provider. Document Released: 07/04/2011 Document Revised: 12/21/2015 Document Reviewed: 05/19/2013 Elsevier Interactive Patient Education  2017 [employer].  IF you received an x-ray today, you will receive an invoice from [facility]. Please contact [facility]  Rad[facility] at [phone] with questions or concerns regarding your invoice.   IF you received labwork today, you will receive an invoice from [facility]. Please contact [facility] at [phone] with questions or concerns regarding your invoice.   Our billing staff will not be able to assist you with questions regarding bills from these companies.  You will be contacted with the lab results as soon as they are available. The fastest way to get your results is to activate your My Chart account. Instructions are located on the last page of this paperwork. If you have not heard from [place] regarding the results in 2 weeks, please contact this office.

## 2016-09-02 NOTE — Progress Notes (Signed)
[facility] [address] [place] [address] [phone]  Date:  09/02/2016   Name:  Samuel Chambers   DOB:  08/22/1950   MRN:  [id number]  PCP:  Sanda Klein, MD    Chief Complaint: No chief complaint on file.   History of Present Illness:  This is a pleasant 66 y.o. male with PMH HTN, CAD, GERD, HLD, obesity who is presenting for CPE. He is here today with his wife, Samuel Chambers.   Care team: Cardiologist-  Dr. Sallyanne Kuster HTN - F/u with cardiology. HCTZ, metoprolol and Losartan. Last appt w cardiology was 02/2016 CAD -   H/o cardiac catheterization 2008 - stent placement L circumflex coronary artery.   GI: Dr. Fuller Plan. H/o adenomatous colon polyps. Last colonoscopy 11/2015  - 3 polyps removed. Advised next colonoscopy in 2022  Last lipid screening: 08/2015 - abnormal Last diabetes screen: 08/2015 - wnl  H/o HLD - intolerant to statins, causes muscle aches.  H/o OA - L total knee replacement and left hip.  H/o fibromyalgia - Cymbalta 53m. Not well controlled. Needs refill.    Complaints:  "my body hurts all the time". He believes he has fibromyalgia.  Immunizations: Needs Shingles, Flu shot, Pneumococcal.  Dentist: Last saw several years ago. Does not floss.  Eye: wears glasses.  Diet: Fast food several times a week. He drinks diet soda.  Exercise: limited by knee problems and body aches.  Fam hx: breast cancer and diabetes (mother); prostate, lung cancer, stroke, HTN, heart disease (father).  Sexual hx: Married Urinary hesitancy/frequency or nocturia: Not as strong a stream as he used to have.  Tobacco/alcohol/substance use: None. Never smoker  Colonoscopy: 2017, q 5 years. Advance directives were discussed. Pt does not have advanced directives. Declines information at this time.   Review of Systems:  Review of Systems  Constitutional: Negative for activity change, appetite change, chills, diaphoresis and fatigue.  HENT: Negative for congestion, dental  problem, sneezing and tinnitus.   Eyes: Negative for visual disturbance.  Respiratory: Negative for cough, chest tightness, shortness of breath and wheezing.   Cardiovascular: Negative for chest pain, palpitations and leg swelling.  Gastrointestinal: Negative for abdominal pain, blood in stool, constipation, diarrhea, nausea and vomiting.  Endocrine: Negative for polydipsia, polyphagia and polyuria.  Genitourinary: Negative for decreased urine volume, difficulty urinating, discharge, hematuria, scrotal swelling and testicular pain.  Musculoskeletal: Positive for arthralgias and myalgias. Negative for back pain, neck pain and neck stiffness.  Allergic/Immunologic: Negative for environmental allergies and food allergies.  Neurological: Negative for dizziness, syncope, weakness, light-headedness and headaches.  Psychiatric/Behavioral: Negative for sleep disturbance. The patient is not nervous/anxious.     Patient Active Problem List   Diagnosis Date Noted  . Chest pain with moderate risk for cardiac etiology 11/15/2015  . Upper airway cough syndrome 06/06/2015  . Severe obesity (BMI 35.0-35.9 with comorbidity) [place] 08/04/2014  . Hyperlipidemia with target LDL less than 70; statin intolerant 04/29/2013  . CAD S/P percutaneous coronary angioplasty   . S/P laparoscopic cholecystectomy July 2014 02/18/2013  . Gallstones 01/28/2013  . OA (osteoarthritis) of knee 09/04/2012  . Acute medial meniscal tear 03/27/2012  . ANXIETY 02/24/2007  . Essential hypertension 02/24/2007  . GERD 02/24/2007  . DEGENERATIVE JOINT DISEASE, GENERALIZED 02/24/2007    Prior to Admission medications   Medication Sig Start Date End Date Taking? Authorizing Provider  albuterol (PROVENTIL HFA;VENTOLIN HFA) 108 (90 Base) MCG/ACT inhaler Inhale 2 puffs into the lungs every 4 (four) hours  as needed for wheezing or shortness of breath (cough, shortness of breath or wheezing.). 06/10/16   Gelene Mink Kaidance Pantoja, PA-C   benzonatate (TESSALON) 100 MG capsule Take 1-2 capsules (100-200 mg total) by mouth 3 (three) times daily as needed for cough. 06/10/16   Gelene Mink Allan Minotti, PA-C  chlorpheniramine-HYDROcodone (TUSSIONEX PENNKINETIC ER) 10-8 MG/5ML SUER Take 5 mLs by mouth every 12 (twelve) hours as needed for cough. 07/12/16   Grainger Mccarley Whitney Rj Pedrosa, PA-C  clopidogrel (PLAVIX) 75 MG tablet TAKE 1 TABLET (75 MG TOTAL) BY MOUTH DAILY. 09/21/15   Leonie Man, MD  DULoxetine (CYMBALTA) 60 MG capsule TAKE 1 CAPSULE (60 MG TOTAL) BY MOUTH EVERY MORNING. 09/21/15   Leonie Man, MD  furosemide (LASIX) 20 MG tablet Take 1 tablet (20 mg total) by mouth daily. 09/21/15   Leonie Man, MD  hydrochlorothiazide (HYDRODIURIL) 25 MG tablet Take 1 tablet (25 mg total) by mouth daily. 11/29/15   Mihai Croitoru, MD  HYDROcodone-homatropine (HYCODAN) 5-1.5 MG/5ML syrup Take 5 mLs by mouth every 8 (eight) hours as needed for cough. 06/10/16   Tavarion Babington Whitney Anu Stagner, PA-C  losartan (COZAAR) 100 MG tablet Take 1 tablet (100 mg total) by mouth daily. 08/21/16   Mihai Croitoru, MD  metoprolol succinate (TOPROL-XL) 50 MG 24 hr tablet TAKE 1 TABLET (50 MG TOTAL) BY MOUTH DAILY. 09/21/15   Leonie Man, MD  pantoprazole (PROTONIX) 40 MG tablet TAKE 1 TABLET BY MOUTH EVERY DAY 30 TO 60 MINUTES BEFORE FIRST MEAL 06/10/16   Gelene Mink Kenley Troop, PA-C    Allergies  Allergen Reactions  . Crestor [Rosuvastatin] Other (See Comments)    myalgias  . Oxycodone Other (See Comments)    Hallucinations-ok in small doses  . Prednisone Other (See Comments)    Increased BP  . Valsartan Other (See Comments)    Made patient feel tired and no energy  . Warfarin And Related Other (See Comments)    Fast heart beat, went down hill, felt like he was dying    Past Surgical History:  Procedure Laterality Date  . CHOLECYSTECTOMY N/A 02/18/2013   Procedure: LAPAROSCOPIC CHOLECYSTECTOMY WITH INTRAOPERATIVE CHOLANGIOGRAM;  Surgeon: Pedro Earls, MD;  Location: [facility];  Service: General;  Laterality: N/A;  . COLONOSCOPY    . CORONARY ANGIOPLASTY WITH STENT PLACEMENT  04/30/2007   2.5 mm Promus stent that was to 95% Circ;had 60-70% lesion to RCA  . DOPPLER ECHOCARDIOGRAPHY  05/27/2002   [facility].-normal EF 55-66%,  . HEEL SPUR SURGERY    . HEMORRHOID SURGERY    . hip surgery     complete  . Holter Monitor  04/07/2007   sinus tachy.;  . KNEE ARTHROSCOPY  03/27/2012   Procedure: ARTHROSCOPY KNEE;  Surgeon: Gearlean Alf, MD;  Location: [facility];  Service: Orthopedics;  Laterality: Left;  . NM MYOCAR PERF WALL MOTION  12/19/2011   EXERCISED FOR 8-1/2 MINUTES RECHING 10 METABOLIC EQUIVALENTS. NO EVIDENCE  OF ISCHEMIA OR INFARCTION . EF 71%  . renal dopplers  04/08/2007   relatively normal  . TOTAL KNEE ARTHROPLASTY Left 09/04/2012   Procedure: TOTAL KNEE ARTHROPLASTY;  Surgeon: Gearlean Alf, MD;  Location: [facility];  Service: Orthopedics;  Laterality: Left;    Social History  Substance Use Topics  . Smoking status: Never Smoker  . Smokeless tobacco: Never Used  . Alcohol use No    Family History  Problem Relation Age of Onset  . Breast cancer Mother   . Diabetes  Mother   . Prostate cancer Father   . Colon cancer Neg Hx     Medication list has been reviewed and updated.  Physical Examination:  Physical Exam  Constitutional: He is oriented to person, place, and time. He appears well-developed and well-nourished. No distress.  HENT:  Head: Normocephalic and atraumatic.  Right Ear: External ear normal.  Left Ear: External ear normal.  Nose: Nose normal.  Mouth/Throat: No oropharyngeal exudate.  Eyes: Conjunctivae and EOM are normal. Pupils are equal, round, and reactive to light.  Neck: Normal range of motion. No thyromegaly present.  Cardiovascular: Normal rate, regular rhythm, normal heart sounds and intact distal pulses.   No murmur heard. Pulmonary/Chest: Effort normal and breath sounds normal. No  respiratory distress. He has no wheezes.  Abdominal: Soft. Bowel sounds are normal. He exhibits no distension and no mass. There is no tenderness.  Musculoskeletal: Normal range of motion. He exhibits no edema.  Lymphadenopathy:    He has no cervical adenopathy.  Neurological: He is alert and oriented to person, place, and time. He has normal reflexes.  Skin: Skin is warm and dry.  Psychiatric: He has a normal mood and affect. His behavior is normal. Judgment and thought content normal.  Vitals reviewed.   Blood pressure (!) 143/85, pulse 88, temperature 98 F (36.7 C), temperature source Oral, resp. rate 16, height 6' (1.829 m), weight 276 lb (125.2 kg), SpO2 96 %.  Assessment and Plan: 1. Annual physical exam 2. Hyperlipidemia, unspecified hyperlipidemia type - Lipid panel  3. Encounter for hepatitis C screening test for low risk patient - Hepatitis C antibody  4. Screening for HIV (human immunodeficiency virus) - HIV antibody  5. Screening for diabetes mellitus - CMP14+EGFR  6. Weak urinary stream - PSA  7. Fibromyalgia - gabapentin (NEURONTIN) 300 MG capsule; Take 1 capsule (300 mg total) by mouth 3 (three) times daily.  Dispense: 90 capsule; Refill: 0 - Will taper to d/c Cymbalta, start Gabapentin, 39m once daily for one day, then 300 twice a day for one day; then 300 mg three times a day. F/u in 1-2 weeks.  - Encouraged pt to start healthy diet and start exercising.   8. Need for shingles vaccine - Zoster Vaccine Live, PF, (ZOSTAVAX) [id number]/0.65ML injection; Inject 19,400 Units into the skin once.  Dispense: 1 each; Refill: 0  9. Need for prophylactic vaccination and inoculation against influenza - Flu Vaccine QUAD 36+ mos IM  10. Need for vaccination with 13-polyvalent pneumococcal conjugate vaccine - Pneumococcal conjugate vaccine 13-valent IM   WMercer Pod PA-C  [facility] [place]2/11/2016 8:43 AM

## 2016-09-03 LAB — CMP14+EGFR
ALT: 38 IU/L (ref 0–44)
AST: 30 IU/L (ref 0–40)
Albumin/Globulin Ratio: 1.7 (ref 1.2–2.2)
Albumin: 4.5 g/dL (ref 3.6–4.8)
Alkaline Phosphatase: 64 IU/L (ref 39–117)
BUN/Creatinine Ratio: 18 (ref 10–24)
BUN: 19 mg/dL (ref 8–27)
Bilirubin Total: 0.5 mg/dL (ref 0.0–1.2)
CO2: 25 mmol/L (ref 18–29)
Calcium: 9.6 mg/dL (ref 8.6–10.2)
Chloride: 94 mmol/L — ABNORMAL LOW (ref 96–106)
Creatinine, Ser: 1.08 mg/dL (ref 0.76–1.27)
GFR calc Af Amer: 83 mL/min/{1.73_m2} (ref >59)
GFR calc non Af Amer: 72 mL/min/{1.73_m2} (ref >59)
Globulin, Total: 2.6 g/dL (ref 1.5–4.5)
Glucose: 110 mg/dL — ABNORMAL HIGH (ref 65–99)
Potassium: 3.6 mmol/L (ref 3.5–5.2)
Sodium: 138 mmol/L (ref 134–144)
Total Protein: 7.1 g/dL (ref 6.0–8.5)

## 2016-09-03 LAB — PSA: Prostate Specific Ag, Serum: 4.6 ng/mL — ABNORMAL HIGH (ref 0.0–4.0)

## 2016-09-03 LAB — LIPID PANEL
Chol/HDL Ratio: 5.1 ratio units — ABNORMAL HIGH (ref 0.0–5.0)
Cholesterol, Total: 180 mg/dL (ref 100–199)
HDL: 35 mg/dL — ABNORMAL LOW (ref >39)
LDL Calculated: 102 mg/dL — ABNORMAL HIGH (ref 0–99)
Triglycerides: 215 mg/dL — ABNORMAL HIGH (ref 0–149)
VLDL Cholesterol Cal: 43 mg/dL — ABNORMAL HIGH (ref 5–40)

## 2016-09-03 LAB — HIV ANTIBODY (ROUTINE TESTING W REFLEX): HIV Screen 4th Generation wRfx: NONREACTIVE

## 2016-09-03 LAB — HEPATITIS C ANTIBODY: Hep C Virus Ab: 0.1 {s_co_ratio} (ref 0.0–0.9)

## 2016-09-05 ENCOUNTER — Other Ambulatory Visit: Payer: Self-pay | Admitting: Physician Assistant

## 2016-09-05 DIAGNOSIS — K219 Gastro-esophageal reflux disease without esophagitis: Secondary | ICD-10-CM

## 2016-09-09 ENCOUNTER — Telehealth: Payer: Self-pay

## 2016-09-09 ENCOUNTER — Other Ambulatory Visit: Payer: Self-pay | Admitting: Physician Assistant

## 2016-09-09 ENCOUNTER — Encounter: Payer: Self-pay | Admitting: Physician Assistant

## 2016-09-09 DIAGNOSIS — F411 Generalized anxiety disorder: Secondary | ICD-10-CM

## 2016-09-09 MED ORDER — DULOXETINE HCL 30 MG PO CPEP: 30.0000 mg | ORAL_CAPSULE | Freq: Every day | ORAL | 1 refills | Status: DC

## 2016-09-09 MED ORDER — DULOXETINE HCL 20 MG PO CPEP: 20.0000 mg | ORAL_CAPSULE | Freq: Every day | ORAL | 1 refills | Status: DC

## 2016-09-09 MED ORDER — HYDROXYZINE HCL 50 MG PO TABS: 50.0000 mg | ORAL_TABLET | Freq: Four times a day (QID) | ORAL | 0 refills | Status: DC | PRN

## 2016-09-09 NOTE — Telephone Encounter (Signed)
PT WIFE STATES THAT PT NEEDS TO GET OFF OF GABATATIN ASAP NEEDS TO SPEAK WITH [facility].Marland Kitchen  PLEASE ADVISE: [phone]

## 2016-09-10 ENCOUNTER — Encounter: Payer: Self-pay | Admitting: Physician Assistant

## 2016-09-16 ENCOUNTER — Encounter: Payer: Self-pay | Admitting: Physician Assistant

## 2016-09-16 ENCOUNTER — Ambulatory Visit (INDEPENDENT_AMBULATORY_CARE_PROVIDER_SITE_OTHER): Payer: Medicare Other | Admitting: Physician Assistant

## 2016-09-16 VITALS — BP 143/82 | HR 88 | Temp 98.0°F | Resp 16 | Ht 72.0 in | Wt 283.0 lb

## 2016-09-16 DIAGNOSIS — R0981 Nasal congestion: Secondary | ICD-10-CM | POA: Diagnosis not present

## 2016-09-16 DIAGNOSIS — R059 Cough, unspecified: Secondary | ICD-10-CM

## 2016-09-16 DIAGNOSIS — T887XXA Unspecified adverse effect of drug or medicament, initial encounter: Secondary | ICD-10-CM | POA: Diagnosis not present

## 2016-09-16 DIAGNOSIS — R05 Cough: Secondary | ICD-10-CM | POA: Diagnosis not present

## 2016-09-16 DIAGNOSIS — T50905A Adverse effect of unspecified drugs, medicaments and biological substances, initial encounter: Secondary | ICD-10-CM

## 2016-09-16 MED ORDER — FLUTICASONE PROPIONATE 50 MCG/ACT NA SUSP: 2.0000 | Freq: Every day | NASAL | 6 refills | Status: DC

## 2016-09-16 MED ORDER — BENZONATATE 100 MG PO CAPS: 100.0000 mg | ORAL_CAPSULE | Freq: Three times a day (TID) | ORAL | 0 refills | Status: DC | PRN

## 2016-09-16 NOTE — Progress Notes (Signed)
Samuel Chambers  MRN: [id number] DOB: Jul 29, 1951  PCP: Sanda Klein, MD  Subjective:  Pt is a pleasant 66 year old male who presents to clinic for f/u medications. Pt feels he came off Cymbalta too quickly and is having adverse effects. C/o anxiety, trembling, sleep disturbances.  Also c/o cough. Productive. Is keeping him up at night.  Denies fever, chills, chest pain, palpitations, n/v/d.   Review of Systems  Constitutional: Positive for diaphoresis and fatigue. Negative for chills and fever.  Respiratory: Positive for cough. Negative for chest tightness, shortness of breath and wheezing.   Cardiovascular: Negative for chest pain, palpitations and leg swelling.  Gastrointestinal: Negative for diarrhea, nausea and vomiting.  Neurological: Negative for dizziness, syncope, light-headedness and headaches.  Psychiatric/Behavioral: Positive for sleep disturbance. The patient is nervous/anxious.     Patient Active Problem List   Diagnosis Date Noted  . Chest pain with moderate risk for cardiac etiology 11/15/2015  . Upper airway cough syndrome 06/06/2015  . Severe obesity (BMI 35.0-35.9 with comorbidity) ([place]) 08/04/2014  . Hyperlipidemia with target LDL less than 70; statin intolerant 04/29/2013  . CAD S/P percutaneous coronary angioplasty   . S/P laparoscopic cholecystectomy July 2014 02/18/2013  . Gallstones 01/28/2013  . OA (osteoarthritis) of knee 09/04/2012  . Acute medial meniscal tear 03/27/2012  . Anxiety state 02/24/2007  . Essential hypertension 02/24/2007  . GERD 02/24/2007  . DEGENERATIVE JOINT DISEASE, GENERALIZED 02/24/2007    Current Outpatient Prescriptions on File Prior to Visit  Medication Sig Dispense Refill  . clopidogrel (PLAVIX) 75 MG tablet TAKE 1 TABLET (75 MG TOTAL) BY MOUTH DAILY. 90 tablet 3  . DULoxetine (CYMBALTA) 20 MG capsule Take 1 capsule (20 mg total) by mouth daily. 90 capsule 1  . DULoxetine (CYMBALTA) 30 MG capsule Take 1 capsule (30 mg  total) by mouth daily. 90 capsule 1  . furosemide (LASIX) 20 MG tablet Take 1 tablet (20 mg total) by mouth daily. 90 tablet 3  . gabapentin (NEURONTIN) 300 MG capsule Take 1 capsule (300 mg total) by mouth 3 (three) times daily. 90 capsule 0  . hydrochlorothiazide (HYDRODIURIL) 25 MG tablet Take 1 tablet (25 mg total) by mouth daily. 30 tablet 11  . hydrOXYzine (ATARAX/VISTARIL) 50 MG tablet Take 1 tablet (50 mg total) by mouth every 6 (six) hours as needed. Max dose 100mg  every 6 hrs as needed for anxiety. 30 tablet 0  . losartan (COZAAR) 100 MG tablet Take 1 tablet (100 mg total) by mouth daily. 90 tablet 0  . metoprolol succinate (TOPROL-XL) 50 MG 24 hr tablet TAKE 1 TABLET (50 MG TOTAL) BY MOUTH DAILY. 90 tablet 3  . pantoprazole (PROTONIX) 40 MG tablet TAKE 1 TABLET BY MOUTH EVERY DAY 30 TO 60 MINUTES BEFORE FIRST MEAL 90 tablet 0   No current facility-administered medications on file prior to visit.     Allergies  Allergen Reactions  . Crestor [Rosuvastatin] Other (See Comments)    myalgias  . Oxycodone Other (See Comments)    Hallucinations-ok in small doses  . Prednisone Other (See Comments)    Increased BP  . Valsartan Other (See Comments)    Made patient feel tired and no energy  . Warfarin And Related Other (See Comments)    Fast heart beat, went down hill, felt like he was dying     Objective:  BP (!) 143/82   Pulse 88   Temp 98 F (36.7 C) (Oral)   Resp 16   Ht 6' (  1.829 m)   Wt 283 lb (128.4 kg)   SpO2 96%   BMI 38.38 kg/m   Physical Exam  Constitutional: He is oriented to person, place, and time and well-developed, well-nourished, and in no distress. No distress.  HENT:  Mouth/Throat: Mucous membranes are normal. Posterior oropharyngeal edema present. No oropharyngeal exudate or posterior oropharyngeal erythema.  Cardiovascular: Normal rate, regular rhythm and normal heart sounds.   Pulmonary/Chest: Effort normal and breath sounds normal. No respiratory  distress.  Neurological: He is alert and oriented to person, place, and time. GCS score is 15.  Skin: Skin is warm and dry.  Psychiatric: Memory, affect and judgment normal. His mood appears anxious.  Vitals reviewed.   Assessment and Plan :  1. Cough 2. Nasal congestion - fluticasone (FLONASE) 50 MCG/ACT nasal spray; Place 2 sprays into both nostrils daily.  Dispense: 16 g; Refill: 6 - benzonatate (TESSALON) 100 MG capsule; Take 1-2 capsules (100-200 mg total) by mouth 3 (three) times daily as needed for cough.  Dispense: 40 capsule; Refill: 0 - Supportive care: push fluids, rest. RTC if no improvement.  3. Adverse effect of drug, initial encounter - Discussed with pt new plan for d/c Cymbalta. He will increase dose to his previous maintenance dose and will taper down every 2 weeks. Printed out schedule for pt. RTC in 1 month for recheck.    Mercer Pod, PA-C  [facility] [place] 09/16/2016 9:53 AM

## 2016-09-16 NOTE — Patient Instructions (Addendum)
flonase - 2 sprays each nostril before bed time.  Delsym cough syrup OTC.   Thank you for coming in today. I hope you feel we met your needs.  Feel free to call [facility] if you have any questions or further requests.  Please consider signing up for MyChart if you do not already have it, as this is a great way to communicate with me.  Best,  Whitney McVey, PA-C  IF you received an x-ray today, you will receive an invoice from [facility]. Please contact [facility] at [phone] with questions or concerns regarding your invoice.   IF you received labwork today, you will receive an invoice from [facility]. Please contact [facility] at [phone] with questions or concerns regarding your invoice.   Our billing staff will not be able to assist you with questions regarding bills from these companies.  You will be contacted with the lab results as soon as they are available. The fastest way to get your results is to activate your My Chart account. Instructions are located on the last page of this paperwork. If you have not heard from [place] regarding the results in 2 weeks, please contact this office.

## 2016-09-29 ENCOUNTER — Other Ambulatory Visit: Payer: Self-pay | Admitting: Physician Assistant

## 2016-09-29 DIAGNOSIS — M797 Fibromyalgia: Secondary | ICD-10-CM

## 2016-10-01 ENCOUNTER — Telehealth: Payer: Self-pay | Admitting: Student

## 2016-10-01 NOTE — Telephone Encounter (Signed)
Pt is still having couging issues and believes that it is getting worse and would like a referral to dr wert   Best number [phone]

## 2016-10-02 NOTE — Telephone Encounter (Signed)
Pt wife called dr wert's office and got an appt for march 22 but she states that she is hoping that mcvey can get an earlier appt for him   Best number [phone]

## 2016-10-03 ENCOUNTER — Telehealth: Payer: Self-pay | Admitting: Physician Assistant

## 2016-10-03 NOTE — Telephone Encounter (Signed)
Does he need earlier?

## 2016-10-03 NOTE — Telephone Encounter (Signed)
Pt wife is calling wanting to talk with mcvey about the fact that she believes that pt has a sinus infection and what can be done with out him coming in   [place] number [phone]

## 2016-10-04 ENCOUNTER — Other Ambulatory Visit: Payer: Self-pay | Admitting: Physician Assistant

## 2016-10-04 DIAGNOSIS — R05 Cough: Secondary | ICD-10-CM

## 2016-10-04 DIAGNOSIS — R059 Cough, unspecified: Secondary | ICD-10-CM

## 2016-10-04 DIAGNOSIS — J42 Unspecified chronic bronchitis: Secondary | ICD-10-CM

## 2016-10-04 MED ORDER — HYDROCODONE-ACETAMINOPHEN 5-325 MG PO TABS: 1.0000 | ORAL_TABLET | Freq: Four times a day (QID) | ORAL | 0 refills | Status: DC | PRN

## 2016-10-04 MED ORDER — FAMOTIDINE 20 MG PO TABS: 20.0000 mg | ORAL_TABLET | Freq: Every day | ORAL | 3 refills | Status: DC

## 2016-10-04 MED ORDER — PREDNISONE 20 MG PO TABS: ORAL_TABLET | ORAL | 0 refills | Status: DC

## 2016-10-04 MED ORDER — AMOXICILLIN-POT CLAVULANATE 875-125 MG PO TABS: 1.0000 | ORAL_TABLET | Freq: Two times a day (BID) | ORAL | 0 refills | Status: AC

## 2016-10-04 NOTE — Progress Notes (Signed)
augmentin Famotidine 20mg  HS protonix 40mg  daily 30-60 min before 1st meal  Hydrocodone-acetaminophem 5-325 mg

## 2016-10-04 NOTE — Telephone Encounter (Signed)
Pt seen a couple weeks ago for congestion  Has had a cough  Since 05/2016, congestion in chest  With some productivity. Hard cough. Denies fever today, sob.  I advised office visit to evaluate lungs , wife wants to speak to [facility] and not come in.  Dr wert 10/17/16 pulmonary but wants a sooner appt. If you can call to see if earlier appt. Is available (provider to provider)  Has tried all otc cough med with out relief.

## 2016-10-05 NOTE — Telephone Encounter (Signed)
Called and spoke to pt and his wife. He is feeling better after Augmentin, Pepcid and prednisone.

## 2016-10-17 ENCOUNTER — Ambulatory Visit: Payer: Medicare Other | Admitting: Internal Medicine

## 2016-11-12 ENCOUNTER — Other Ambulatory Visit: Payer: Self-pay | Admitting: Cardiology

## 2016-11-22 ENCOUNTER — Other Ambulatory Visit: Payer: Self-pay | Admitting: Cardiovascular Disease

## 2016-11-22 NOTE — Telephone Encounter (Signed)
REFILL 

## 2016-12-04 ENCOUNTER — Other Ambulatory Visit: Payer: Self-pay | Admitting: Physician Assistant

## 2016-12-04 DIAGNOSIS — K219 Gastro-esophageal reflux disease without esophagitis: Secondary | ICD-10-CM

## 2016-12-05 DIAGNOSIS — M1711 Unilateral primary osteoarthritis, right knee: Secondary | ICD-10-CM | POA: Diagnosis not present

## 2016-12-06 ENCOUNTER — Telehealth: Payer: Self-pay | Admitting: General Practice

## 2016-12-06 DIAGNOSIS — K219 Gastro-esophageal reflux disease without esophagitis: Secondary | ICD-10-CM

## 2016-12-06 MED ORDER — PANTOPRAZOLE SODIUM 40 MG PO TBEC: DELAYED_RELEASE_TABLET | ORAL | 0 refills | Status: DC

## 2016-12-06 NOTE — Telephone Encounter (Signed)
10/04/16 last refill 08/2016 last ov

## 2016-12-06 NOTE — Telephone Encounter (Signed)
Pt is checking on refill request of his acid reflux medication   Best number [phone]

## 2016-12-13 ENCOUNTER — Other Ambulatory Visit: Payer: Self-pay | Admitting: Cardiovascular Disease

## 2016-12-13 ENCOUNTER — Other Ambulatory Visit: Payer: Self-pay

## 2016-12-13 MED ORDER — HYDROCHLOROTHIAZIDE 25 MG PO TABS: 25.0000 mg | ORAL_TABLET | Freq: Every day | ORAL | 0 refills | Status: DC

## 2016-12-30 ENCOUNTER — Telehealth: Payer: Self-pay | Admitting: Cardiovascular Disease

## 2016-12-30 MED ORDER — HYDROCHLOROTHIAZIDE 25 MG PO TABS: 25.0000 mg | ORAL_TABLET | ORAL | 0 refills | Status: DC

## 2016-12-30 NOTE — Telephone Encounter (Signed)
Let's have him decrease his hydrochlorothiazide to every other day only, please

## 2016-12-30 NOTE — Telephone Encounter (Signed)
Spoke with pt wife, aware of dr croitoru's recommendations. They will call with continued problems.

## 2016-12-30 NOTE — Telephone Encounter (Addendum)
Spoke with pt and wife, patient reports since Friday he has noticed lightheadedness when standing from sitting. His bp last night was 107/71and heart rate of 115. He reports skipped beats all the time. This morning his bp 151/94 and pulse 84. Medications confirmed. He has a follow up appt in august and an appt was made for him to see the APP 6/18. He will track his bp until his follow up. Will make dr croitoru aware

## 2016-12-30 NOTE — Telephone Encounter (Signed)
New message    Patient wife calling   When patient is standing up feeling lightheaded - wife check pulse feels a skip beat .    No chest pain    No sob

## 2017-01-13 ENCOUNTER — Ambulatory Visit: Payer: Medicare Other | Admitting: Cardiology

## 2017-01-21 DIAGNOSIS — D2272 Melanocytic nevi of left lower limb, including hip: Secondary | ICD-10-CM | POA: Diagnosis not present

## 2017-01-21 DIAGNOSIS — L738 Other specified follicular disorders: Secondary | ICD-10-CM | POA: Diagnosis not present

## 2017-01-21 DIAGNOSIS — L218 Other seborrheic dermatitis: Secondary | ICD-10-CM | POA: Diagnosis not present

## 2017-01-21 DIAGNOSIS — L918 Other hypertrophic disorders of the skin: Secondary | ICD-10-CM | POA: Diagnosis not present

## 2017-01-21 DIAGNOSIS — L821 Other seborrheic keratosis: Secondary | ICD-10-CM | POA: Diagnosis not present

## 2017-02-11 ENCOUNTER — Other Ambulatory Visit: Payer: Self-pay | Admitting: Cardiovascular Disease

## 2017-02-24 ENCOUNTER — Other Ambulatory Visit: Payer: Self-pay | Admitting: Cardiovascular Disease

## 2017-03-06 ENCOUNTER — Other Ambulatory Visit: Payer: Self-pay | Admitting: Physician Assistant

## 2017-03-06 DIAGNOSIS — K219 Gastro-esophageal reflux disease without esophagitis: Secondary | ICD-10-CM

## 2017-03-12 ENCOUNTER — Other Ambulatory Visit: Payer: Self-pay | Admitting: Physician Assistant

## 2017-03-12 DIAGNOSIS — F411 Generalized anxiety disorder: Secondary | ICD-10-CM

## 2017-03-14 ENCOUNTER — Other Ambulatory Visit: Payer: Self-pay

## 2017-03-14 ENCOUNTER — Ambulatory Visit (INDEPENDENT_AMBULATORY_CARE_PROVIDER_SITE_OTHER): Payer: Medicare Other | Admitting: Cardiovascular Disease

## 2017-03-14 ENCOUNTER — Encounter: Payer: Self-pay | Admitting: Cardiovascular Disease

## 2017-03-14 VITALS — BP 132/72 | HR 71 | Ht 72.0 in | Wt 263.0 lb

## 2017-03-14 DIAGNOSIS — I10 Essential (primary) hypertension: Secondary | ICD-10-CM | POA: Diagnosis not present

## 2017-03-14 DIAGNOSIS — Z6835 Body mass index (BMI) 35.0-35.9, adult: Secondary | ICD-10-CM | POA: Diagnosis not present

## 2017-03-14 DIAGNOSIS — Z9861 Coronary angioplasty status: Secondary | ICD-10-CM

## 2017-03-14 DIAGNOSIS — Z79899 Other long term (current) drug therapy: Secondary | ICD-10-CM

## 2017-03-14 DIAGNOSIS — I2510 Atherosclerotic heart disease of native coronary artery without angina pectoris: Secondary | ICD-10-CM

## 2017-03-14 DIAGNOSIS — E785 Hyperlipidemia, unspecified: Secondary | ICD-10-CM

## 2017-03-14 LAB — LIPID PANEL
CHOL/HDL RATIO: 5.3 ratio — AB (ref 0.0–5.0)
Cholesterol, Total: 180 mg/dL (ref 100–199)
HDL: 34 mg/dL — AB (ref >39)
LDL Calculated: 103 mg/dL — ABNORMAL HIGH (ref 0–99)
TRIGLYCERIDES: 214 mg/dL — AB (ref 0–149)
VLDL CHOLESTEROL CAL: 43 mg/dL — AB (ref 5–40)

## 2017-03-14 MED ORDER — HYDROCHLOROTHIAZIDE 25 MG PO TABS: 25.0000 mg | ORAL_TABLET | Freq: Every day | ORAL | 3 refills | Status: DC

## 2017-03-14 MED ORDER — LOSARTAN POTASSIUM 100 MG PO TABS: 100.0000 mg | ORAL_TABLET | Freq: Every day | ORAL | 3 refills | Status: DC

## 2017-03-14 MED ORDER — METOPROLOL SUCCINATE ER 50 MG PO TB24: 50.0000 mg | ORAL_TABLET | Freq: Every day | ORAL | 3 refills | Status: DC

## 2017-03-14 MED ORDER — CLOPIDOGREL BISULFATE 75 MG PO TABS: 75.0000 mg | ORAL_TABLET | Freq: Every day | ORAL | 3 refills | Status: DC

## 2017-03-14 MED ORDER — FUROSEMIDE 20 MG PO TABS: 20.0000 mg | ORAL_TABLET | Freq: Every day | ORAL | 3 refills | Status: DC

## 2017-03-14 NOTE — Progress Notes (Signed)
Patient ID: Samuel Chambers, male   DOB: 03-Sep-1950, 66 y.o.   MRN: [id number]     Cardiology Office Note    Date:  03/14/2017   ID:  JEWELZ KOBUS, DOB 07/06/1951, MRN [id number]  PCP:  Sanda Klein, MD  Cardiologist:   Sanda Klein, MD  chief complaint:Erratic blood pressure control   History of Present Illness:  Samuel Chambers is a 66 y.o. male with a long-standing history of coronary artery disease status post remote drug-eluting stent to the left circumflex coronary artery in 2008, without subsequent coronary events. He has had difficulty in controlling his hypertension. He is severely obese and also has hyperlipidemia, intolerant to statins.  He feels great. He exercises a lot by working in the yard and in his garage. He does not engage in routine sports. He has lost 20 pounds in the last 6 months. He still has a 48 choice. He denies exertional angina or dyspnea and has not had focal neurological complaints or claudication. He does complain of cold feet even when attempted time. He has been diagnosed with fibromyalgia. An attempt was made to wean him off Cymbalta and replaced with gabapentin, successfully. He is back on Cymbalta and his symptoms are well-controlled  He has tried 3 different statins (atorvastatin, simvastatin, rosuvastatin) as well as ezetimibe, all of which cause muscle aches. Most recent LDL cholesterol was borderline acceptable at 102. He has mild hypertriglyceridemia, low HDL cholesterol and very prominent ventral adiposity.  Past Medical History:  Diagnosis Date  . Adenomatous colon polyp 03/2005  . Anxiety   . CAD S/P percutaneous coronary angioplasty 2008   PCI to circumflex with a Promus DES 2.5 mm x 8 mm  . Degenerative joint disease   . Diverticulosis   . Fibromyalgia   . GERD (gastroesophageal reflux disease)   . Hyperlipidemia    statin intolerant  . Hypertension   . Internal hemorrhoids   . Obesity, Class II, BMI 35-39.9   . PONV (postoperative  nausea and vomiting)     Past Surgical History:  Procedure Laterality Date  . CHOLECYSTECTOMY N/A 02/18/2013   Procedure: LAPAROSCOPIC CHOLECYSTECTOMY WITH INTRAOPERATIVE CHOLANGIOGRAM;  Surgeon: Pedro Earls, MD;  Location: [facility];  Service: General;  Laterality: N/A;  . COLONOSCOPY    . CORONARY ANGIOPLASTY WITH STENT PLACEMENT  04/30/2007   2.5 mm Promus stent that was to 95% Circ;had 60-70% lesion to RCA  . DOPPLER ECHOCARDIOGRAPHY  05/27/2002   [facility]normal EF 55-66%,  . HEEL SPUR SURGERY    . HEMORRHOID SURGERY    . hip surgery     complete  . Holter Monitor  04/07/2007   sinus tachy.;  . KNEE ARTHROSCOPY  03/27/2012   Procedure: ARTHROSCOPY KNEE;  Surgeon: Gearlean Alf, MD;  Location: [facility];  Service: Orthopedics;  Laterality: Left;  . NM MYOCAR PERF WALL MOTION  12/19/2011   EXERCISED FOR 8-1/2 MINUTES RECHING 10 METABOLIC EQUIVALENTS. NO EVIDENCE  OF ISCHEMIA OR INFARCTION . EF 71%  . renal dopplers  04/08/2007   relatively normal  . TOTAL KNEE ARTHROPLASTY Left 09/04/2012   Procedure: TOTAL KNEE ARTHROPLASTY;  Surgeon: Gearlean Alf, MD;  Location: [facility];  Service: Orthopedics;  Laterality: Left;    Current Medications: Outpatient Medications Prior to Visit  Medication Sig Dispense Refill  . benzonatate (TESSALON) 100 MG capsule Take 1-2 capsules (100-200 mg total) by mouth 3 (three) times daily as needed for cough. 40 capsule 0  . clopidogrel (PLAVIX)  75 MG tablet TAKE 1 TABLET BY MOUTH EVERY DAY 90 tablet 1  . furosemide (LASIX) 20 MG tablet TAKE 1 TABLET BY MOUTH EVERY DAY 90 tablet 1  . hydrochlorothiazide (HYDRODIURIL) 25 MG tablet Take 1 tablet (25 mg total) by mouth every other day. 90 tablet 0  . HYDROcodone-acetaminophen (NORCO) 5-325 MG tablet Take 1 tablet by mouth every 6 (six) hours as needed. 30 tablet 0  . losartan (COZAAR) 100 MG tablet Take 1 tablet (100 mg total) by mouth daily. Please keep upcoming appt for further refills, thanks! 30 tablet  0  . metoprolol succinate (TOPROL-XL) 50 MG 24 hr tablet TAKE 1 TABLET BY MOUTH EVERY DAY 90 tablet 1  . pantoprazole (PROTONIX) 40 MG tablet TAKE 1 TABLET BY MOUTH EVERY DAY 30-60 MINUTES BEFORE FIRST MEAL 30 tablet 0  . DULoxetine (CYMBALTA) 30 MG capsule Take 1 capsule (30 mg total) by mouth daily. 90 capsule 1  . DULoxetine (CYMBALTA) 20 MG capsule Take 1 capsule (20 mg total) by mouth daily. 90 capsule 1  . famotidine (PEPCID) 20 MG tablet Take 1 tablet (20 mg total) by mouth at bedtime. 30 tablet 3  . fluticasone (FLONASE) 50 MCG/ACT nasal spray Place 2 sprays into both nostrils daily. 16 g 6  . gabapentin (NEURONTIN) 300 MG capsule TAKE 1 CAPSULE(300 MG) BY MOUTH THREE TIMES DAILY 90 capsule 0  . hydrOXYzine (ATARAX/VISTARIL) 50 MG tablet Take 1 tablet (50 mg total) by mouth every 6 (six) hours as needed. Max dose 100mg  every 6 hrs as needed for anxiety. 30 tablet 0  . predniSONE (DELTASONE) 20 MG tablet Take 20mg  twice a day x 5 days 10 tablet 0   No facility-administered medications prior to visit.      Allergies:   Gabapentin; Crestor [rosuvastatin]; Oxycodone; Prednisone; Valsartan; and Warfarin and related   Social History   Social History  . Marital status: Married    Spouse name: N/A  . Number of children: 1  . Years of education: N/A   Occupational History  .  [employer]   Social History Main Topics  . Smoking status: Never Smoker  . Smokeless tobacco: Never Used  . Alcohol use No  . Drug use: No  . Sexual activity: Not Asked   Other Topics Concern  . None   Social History Narrative   Very little 0-2 drinks a week     Family History:  The patient's family history includes Breast cancer in his mother; Diabetes in his mother; Prostate cancer in his father.   ROS:   Please see the history of present illness.    ROS All other systems reviewed and are negative.   PHYSICAL EXAM:   VS:  BP 132/72   Pulse 71   Ht 6' (1.829 m)   Wt 263 lb (119.3 kg)    BMI 35.67 kg/m    GEN: A very obese but also quite muscular, well developed, in no acute distress  HEENT: normal  Neck: no JVD, carotid bruits, or masses Cardiac: RRR; no murmurs, rubs, or gallops,no edema  Respiratory:  clear to auscultation bilaterally, normal work of breathing GI: soft, nontender, nondistended, + BS. Prominent abdominal obesity. MS: no deformity or atrophy  Skin: warm and dry, no rash Neuro:  Alert and Oriented x 3, Strength and sensation are intact Psych: euthymic mood, full affect  Wt Readings from Last 3 Encounters:  03/14/17 263 lb (119.3 kg)  09/16/16 283 lb (128.4 kg)  09/02/16 276 lb (  125.2 kg)      Studies/Labs Reviewed:   EKG:  EKG  ordered todayShows sinus rhythm with tiny inferior Q waves, no acute repolarization abnormalities, QTC 441 ms  Recent Labs: 07/12/2016: Hemoglobin 14.6 09/02/2016: ALT 38; BUN 19; Creatinine, Ser 1.08; Potassium 3.6; Sodium 138   Lipid Panel    Component Value Date/Time   CHOL 180 09/02/2016 1144   TRIG 215 (H) 09/02/2016 1144   HDL 35 (L) 09/02/2016 1144   CHOLHDL 5.1 (H) 09/02/2016 1144   CHOLHDL 4.9 09/21/2015 1410   VLDL 38 (H) 09/21/2015 1410   LDLCALC 102 (H) 09/02/2016 1144      ASSESSMENT:    1. CAD S/P percutaneous coronary angioplasty   2. Essential hypertension   3. Dyslipidemia   4. Severe obesity (BMI 35.0-35.9 with comorbidity) ([place])   5. Medication management      PLAN:  In order of problems listed above:  1. HTN: After weight loss in addition of daily diuretic his blood pressure is well controlled. He should always use a large blood pressure cuff 2. CAD: Angina free. 3. HLP: His most recent lipid profile still showed the same pattern consistent with insulin resistance/metabolic syndrome, but he has since lost 20 pounds. We'll recheck the parameters. Ideally would like to bring his LDL to less than 70, but he has tried statins and Zetia and cannot tolerate them. We discussed the use of PCS  K9 inhibitors. He is interested, but is skeptical about his insurance coverage. 4. Obesity: Congratulated on substantial weight loss and encouraged to continue his efforts. He has cut out most carbohydrates, but his weakness remains rice with [place] food and bananas. Reviewed calorie restriction and regular physical activity. Discussed a diet rich in protein and high value fat, low in saturated fat and sugars and starches.    Medication Adjustments/Labs and Tests Ordered: Current medicines are reviewed at length with the patient today.  Concerns regarding medicines are outlined above.  Medication changes, Labs and Tests ordered today are listed in the Patient Instructions below. Patient Instructions  Dr Sallyanne Kuster recommends that you continue on your current medications as directed. Please refer to the Current Medication list given to you today.  Your physician recommends that you return for lab work at your earliest [place].  Dr Sallyanne Kuster recommends that you schedule a follow-up appointment in 6 months. You will receive a reminder letter in the mail two months in advance. If you don't receive a letter, please call our office to schedule the follow-up appointment.  If you need a refill on your cardiac medications before your next appointment, please call your pharmacy.    Signed, Sanda Klein, MD  03/14/2017 8:28 AM    [facility] [place], [place], [place]  [address] Phone: [phone]; Fax: [phone]

## 2017-03-14 NOTE — Telephone Encounter (Signed)
Rx(s) sent to pharmacy electronically.  

## 2017-03-14 NOTE — Patient Instructions (Signed)
Dr Croitoru recommends that you continue on your current medications as directed. Please refer to the Current Medication list given to you today.  Your physician recommends that you return for lab work at your earliest convenience - FASTING.  Dr Croitoru recommends that you schedule a follow-up appointment in 6 months. You will receive a reminder letter in the mail two months in advance. If you don't receive a letter, please call our office to schedule the follow-up appointment.  If you need a refill on your cardiac medications before your next appointment, please call your pharmacy. 

## 2017-03-17 ENCOUNTER — Telehealth: Payer: Self-pay | Admitting: Pharmacist Clinician (PhC)/ Clinical Pharmacy Specialist

## 2017-03-17 NOTE — Telephone Encounter (Signed)
LMOM to return call - talk about cholesterol options.  Dr. Loletha Grayer mentioned PCSK-9 inhibitors, pt was concerned about pricing

## 2017-03-19 ENCOUNTER — Other Ambulatory Visit: Payer: Self-pay | Admitting: Physician Assistant

## 2017-03-19 DIAGNOSIS — F411 Generalized anxiety disorder: Secondary | ICD-10-CM

## 2017-03-19 NOTE — Telephone Encounter (Signed)
Please call and schedule patient and appt within 1 month with PCP.

## 2017-03-31 ENCOUNTER — Other Ambulatory Visit: Payer: Self-pay | Admitting: Cardiovascular Disease

## 2017-04-01 NOTE — Telephone Encounter (Signed)
REFILL 

## 2017-04-04 ENCOUNTER — Other Ambulatory Visit: Payer: Self-pay | Admitting: Physician Assistant

## 2017-04-04 DIAGNOSIS — K219 Gastro-esophageal reflux disease without esophagitis: Secondary | ICD-10-CM

## 2017-04-10 ENCOUNTER — Telehealth: Payer: Self-pay | Admitting: Cardiovascular Disease

## 2017-04-10 NOTE — Telephone Encounter (Signed)
Closed Encounter  °

## 2017-04-15 ENCOUNTER — Ambulatory Visit (INDEPENDENT_AMBULATORY_CARE_PROVIDER_SITE_OTHER): Payer: Medicare Other | Admitting: Physician Assistant

## 2017-04-15 ENCOUNTER — Telehealth: Payer: Self-pay | Admitting: *Deleted

## 2017-04-15 ENCOUNTER — Encounter: Payer: Self-pay | Admitting: Physician Assistant

## 2017-04-15 VITALS — BP 138/88 | HR 75 | Temp 98.4°F | Resp 16 | Ht 70.5 in | Wt 265.4 lb

## 2017-04-15 DIAGNOSIS — F411 Generalized anxiety disorder: Secondary | ICD-10-CM

## 2017-04-15 DIAGNOSIS — N529 Male erectile dysfunction, unspecified: Secondary | ICD-10-CM

## 2017-04-15 DIAGNOSIS — Z760 Encounter for issue of repeat prescription: Secondary | ICD-10-CM | POA: Diagnosis not present

## 2017-04-15 DIAGNOSIS — K219 Gastro-esophageal reflux disease without esophagitis: Secondary | ICD-10-CM

## 2017-04-15 MED ORDER — DULOXETINE HCL 30 MG PO CPEP: ORAL_CAPSULE | ORAL | 1 refills | Status: DC

## 2017-04-15 MED ORDER — DULOXETINE HCL 30 MG PO CPEP: 60.0000 mg | ORAL_CAPSULE | Freq: Every day | ORAL | 2 refills | Status: DC

## 2017-04-15 MED ORDER — PANTOPRAZOLE SODIUM 40 MG PO TBEC: DELAYED_RELEASE_TABLET | ORAL | 4 refills | Status: DC

## 2017-04-15 NOTE — Progress Notes (Signed)
Samuel Chambers  MRN: [id number] DOB: June 20, 1951  PCP: Sanda Klein, MD  Subjective:  Pt is a pleasant 66 year old male who presents to clinic for medication refill and problems with erectile dysfunction.   Cymbalta - feels best when he takes two 30mg  tablets qd for a total of 60mg  qd. He is feeling much better than when he was here last. Denies medication side effects. Gabapentin did not work well for him.    Protonix - 40mg  qd. GERD is well controlled.   erectile dysfunction - worsening problem over the past year. He is having difficulty getting and maintaining an erection. Endorses some emotional problems in the bad room. Testosterone levels never been checked. He is currently seeing cardiologist for lipid control.   Review of Systems  Constitutional: Negative for activity change, appetite change and fatigue.  Eyes: Negative for visual disturbance.  Respiratory: Negative for cough, chest tightness, shortness of breath and wheezing.   Cardiovascular: Negative for chest pain, palpitations and leg swelling.  Gastrointestinal: Negative for abdominal pain, blood in stool, constipation, diarrhea, nausea and vomiting.  Genitourinary: Negative for decreased urine volume, difficulty urinating, discharge, hematuria, scrotal swelling and testicular pain.  Musculoskeletal: Negative for back pain.  Neurological: Negative for dizziness, syncope, weakness, light-headedness and headaches.  Psychiatric/Behavioral: Negative for sleep disturbance. The patient is nervous/anxious (about sex).     Patient Active Problem List   Diagnosis Date Noted  . Chest pain with moderate risk for cardiac etiology 11/15/2015  . Upper airway cough syndrome 06/06/2015  . Severe obesity (BMI 35.0-35.9 with comorbidity) ([place]) 08/04/2014  . Hyperlipidemia with target LDL less than 70; statin intolerant 04/29/2013  . CAD S/P percutaneous coronary angioplasty   . S/P laparoscopic cholecystectomy July 2014 02/18/2013    . Gallstones 01/28/2013  . OA (osteoarthritis) of knee 09/04/2012  . Acute medial meniscal tear 03/27/2012  . Anxiety state 02/24/2007  . Essential hypertension 02/24/2007  . GERD 02/24/2007  . DEGENERATIVE JOINT DISEASE, GENERALIZED 02/24/2007    Current Outpatient Prescriptions on File Prior to Visit  Medication Sig Dispense Refill  . benzonatate (TESSALON) 100 MG capsule Take 1-2 capsules (100-200 mg total) by mouth 3 (three) times daily as needed for cough. 40 capsule 0  . clopidogrel (PLAVIX) 75 MG tablet Take 1 tablet (75 mg total) by mouth daily. 90 tablet 3  . DULoxetine (CYMBALTA) 30 MG capsule TAKE 1 CAPSULE(30 MG) BY MOUTH DAILY 30 capsule 0  . DULoxetine (CYMBALTA) 30 MG capsule Take 60 mg by mouth daily.   1  . furosemide (LASIX) 20 MG tablet Take 1 tablet (20 mg total) by mouth daily. 90 tablet 3  . hydrochlorothiazide (HYDRODIURIL) 25 MG tablet Take 1 tablet (25 mg total) by mouth daily. 90 tablet 3  . losartan (COZAAR) 100 MG tablet Take 1 tablet (100 mg total) by mouth daily. ! 90 tablet 3  . losartan (COZAAR) 100 MG tablet TAKE 1 TABLET BY MOUTH DAILY 30 tablet 5  . metoprolol succinate (TOPROL-XL) 50 MG 24 hr tablet Take 1 tablet (50 mg total) by mouth daily. Take with or immediately following a meal. 90 tablet 3  . pantoprazole (PROTONIX) 40 MG tablet TAKE 1 TABLET BY MOUTH EVERY DAY 30-60 MINUTES BEFORE FIRST MEAL 30 tablet 0  . HYDROcodone-acetaminophen (NORCO) 5-325 MG tablet Take 1 tablet by mouth every 6 (six) hours as needed. (Patient not taking: Reported on 04/15/2017) 30 tablet 0   No current facility-administered medications on file prior to visit.  Allergies  Allergen Reactions  . Gabapentin     Pt reports increased heart rate and blood pressure  . Crestor [Rosuvastatin] Other (See Comments)    myalgias  . Oxycodone Other (See Comments)    Hallucinations-ok in small doses  . Prednisone Other (See Comments)    Increased BP  . Valsartan Other (See  Comments)    Made patient feel tired and no energy  . Warfarin And Related Other (See Comments)    Fast heart beat, went down hill, felt like he was dying     Objective:  BP 138/88 (BP Location: Right Arm, Cuff Size: Large)   Pulse 75   Temp 98.4 F (36.9 C) (Oral)   Resp 16   Ht 5' 10.5" (1.791 m)   Wt 265 lb 6.4 oz (120.4 kg)   SpO2 99%   BMI 37.54 kg/m   Physical Exam  Constitutional: He is oriented to person, place, and time and well-developed, well-nourished, and in no distress. No distress.  Cardiovascular: Normal rate, regular rhythm and normal heart sounds.   Neurological: He is alert and oriented to person, place, and time. GCS score is 15.  Skin: Skin is warm and dry.  Psychiatric: Mood, memory, affect and judgment normal.  Vitals reviewed.   Lab Results  Component Value Date   CHOL 180 03/14/2017   HDL 34 (L) 03/14/2017   LDLCALC 103 (H) 03/14/2017   TRIG 214 (H) 03/14/2017   CHOLHDL 5.3 (H) 03/14/2017    Assessment and Plan :  1. Encounter for medication refill 2. Gastroesophageal reflux disease without esophagitis - pantoprazole (PROTONIX) 40 MG tablet; TAKE 1 TABLET BY MOUTH EVERY DAY 30-60 MINUTES BEFORE FIRST MEAL  Dispense: 90 tablet; Refill: 4 - Pt is doing well on this medication. OK to refill.  3. Anxiety state - DULoxetine (CYMBALTA) 30 MG capsule; Take two (2) 30mg  capsules daily  Dispense: 90 capsule; Refill: 1 - DULoxetine (CYMBALTA) 30 MG capsule; Take 2 capsules (60 mg total) by mouth daily.  Dispense: 120 capsule; Refill: 2 - RTC in 6 months for recheck.  4. Erectile dysfunction, unspecified erectile dysfunction type - Testosterone - Pt is being treated for elevated lipids with cardiologist. Plan to check testosterone. Lab is pending. Will consider pharmacotherapy depending on result. Will contact with results and plan.   Mercer Pod, PA-C  [facility] 04/15/2017 8:07 AM

## 2017-04-15 NOTE — Telephone Encounter (Signed)
Spoke with Vladimir Creeks, at [facility] [phone], Cymbalta 30 mg #90 D/C, changed to #120, per W. McVey.

## 2017-04-15 NOTE — Patient Instructions (Addendum)
  Check with your insurance about erectile dysfunction medications. I will contact you about your testosterone level. Great to see you!!! Come back and see me in 6 months.   Thank you for coming in today. I hope you feel we met your needs.  Feel free to call PCP if you have any questions or further requests.  Please consider signing up for MyChart if you do not already have it, as this is a great way to communicate with me.  Best,  Whitney McVey, PA-C   IF you received an x-ray today, you will receive an invoice from [facility]. Please contact [facility] at [phone] with questions or concerns regarding your invoice.   IF you received labwork today, you will receive an invoice from [facility]. Please contact [facility] at [phone] with questions or concerns regarding your invoice.   Our billing staff will not be able to assist you with questions regarding bills from these companies.  You will be contacted with the lab results as soon as they are available. The fastest way to get your results is to activate your My Chart account. Instructions are located on the last page of this paperwork. If you have not heard from [place] regarding the results in 2 weeks, please contact this office.

## 2017-04-16 LAB — TESTOSTERONE: Testosterone: 279 ng/dL (ref 264–916)

## 2017-04-17 ENCOUNTER — Ambulatory Visit: Payer: Medicare Other

## 2017-04-22 ENCOUNTER — Telehealth: Payer: Self-pay

## 2017-04-22 ENCOUNTER — Other Ambulatory Visit: Payer: Self-pay | Admitting: Physician Assistant

## 2017-04-22 DIAGNOSIS — N529 Male erectile dysfunction, unspecified: Secondary | ICD-10-CM

## 2017-04-22 MED ORDER — SILDENAFIL CITRATE 50 MG PO TABS: 50.0000 mg | ORAL_TABLET | Freq: Every day | ORAL | 1 refills | Status: DC | PRN

## 2017-04-22 NOTE — Telephone Encounter (Signed)
Called in rx for viagra. Pt aware.

## 2017-04-24 NOTE — Progress Notes (Signed)
Pls call pt and let him know his testosterone level is within normal limits. He can pick up his prescription for Sildenafil at his pharmacy.  Come back and see me in 1 month to let me know how it's going.  Thank you!

## 2017-04-29 ENCOUNTER — Telehealth: Payer: Self-pay | Admitting: Physician Assistant

## 2017-04-29 NOTE — Telephone Encounter (Signed)
Spoke to patient he would like an alternative the medication was to expensive.

## 2017-04-29 NOTE — Telephone Encounter (Signed)
Pt called returning a call from someone I didn't see any notes of who left pt a message.. Pt stated that if he didn't answer that you can leave a detailed message on his vm..  Please advise: [phone]

## 2017-05-02 ENCOUNTER — Other Ambulatory Visit: Payer: Self-pay | Admitting: Physician Assistant

## 2017-05-02 DIAGNOSIS — N529 Male erectile dysfunction, unspecified: Secondary | ICD-10-CM

## 2017-05-02 NOTE — Telephone Encounter (Signed)
Please call pt and let him know he can find cheaper prices for his Sildenafil at different pharmacies. [facility] in [place] offers this medication for cheap. He can download the "GoodRx" app on his phone - or go to the Good Rx website- to find the lowest prices.  Thank you!

## 2017-05-05 NOTE — Telephone Encounter (Signed)
Detailed message left on phone.

## 2017-05-23 ENCOUNTER — Telehealth: Payer: Self-pay | Admitting: Pharmacist Clinician (PhC)/ Clinical Pharmacy Specialist

## 2017-05-23 NOTE — Telephone Encounter (Signed)
Spoke with patient - he is still hesitant to try anything for his cholesterol due to severe leg pains, from his fibromyalgia.   He has tried statins and other cholesterol lowering agents and reports that all cause him to have this unbearable pain.    Offered to let him try a sample of Repatha, but advised that he wait until after his current fibromyalgia flare is past.  He will see his MD on Tuesday.  Once he is feeling better we can attempt Repatha.   Patient not completely convinced to try, but will think about it and call back later with his decision.  Emailed to him ([email or web address]) the patient information about Repatha as well as contact information for the CV[facility] team.

## 2017-05-23 NOTE — Telephone Encounter (Signed)
-----   Message from Bruno sent at 05/23/2017  8:54 AM EDT ----- Patient had cancelled all appointments with pharmacy to discuss results. Communicated these results with patient on 05/21/17. Strongly encouraged and recommended he follow-up with pharmacy staff to discuss PCS-K9 therapy. Discussed with Renold Genta, PharmD.

## 2017-05-27 DIAGNOSIS — M1711 Unilateral primary osteoarthritis, right knee: Secondary | ICD-10-CM | POA: Diagnosis not present

## 2017-07-10 ENCOUNTER — Encounter: Payer: Self-pay | Admitting: Physician Assistant

## 2017-07-10 ENCOUNTER — Ambulatory Visit (INDEPENDENT_AMBULATORY_CARE_PROVIDER_SITE_OTHER): Payer: Medicare Other

## 2017-07-10 ENCOUNTER — Ambulatory Visit (INDEPENDENT_AMBULATORY_CARE_PROVIDER_SITE_OTHER): Payer: Medicare Other | Admitting: Physician Assistant

## 2017-07-10 VITALS — BP 152/90 | HR 75 | Temp 99.0°F | Resp 16 | Wt 275.6 lb

## 2017-07-10 DIAGNOSIS — R509 Fever, unspecified: Secondary | ICD-10-CM

## 2017-07-10 DIAGNOSIS — I2510 Atherosclerotic heart disease of native coronary artery without angina pectoris: Secondary | ICD-10-CM

## 2017-07-10 DIAGNOSIS — J029 Acute pharyngitis, unspecified: Secondary | ICD-10-CM | POA: Diagnosis not present

## 2017-07-10 DIAGNOSIS — R05 Cough: Secondary | ICD-10-CM

## 2017-07-10 DIAGNOSIS — J069 Acute upper respiratory infection, unspecified: Secondary | ICD-10-CM

## 2017-07-10 DIAGNOSIS — Z9861 Coronary angioplasty status: Secondary | ICD-10-CM | POA: Diagnosis not present

## 2017-07-10 DIAGNOSIS — R059 Cough, unspecified: Secondary | ICD-10-CM

## 2017-07-10 LAB — POCT CBC
GRANULOCYTE PERCENT: 63.4 % (ref 37–80)
HCT, POC: 46 % (ref 43.5–53.7)
HEMOGLOBIN: 15.4 g/dL (ref 14.1–18.1)
Lymph, poc: 3.3 (ref 0.6–3.4)
MCH: 28.2 pg (ref 27–31.2)
MCHC: 33.5 g/dL (ref 31.8–35.4)
MCV: 84.1 fL (ref 80–97)
MID (CBC): 0.7 (ref 0–0.9)
MPV: 7.2 fL (ref 0–99.8)
PLATELET COUNT, POC: 264 10*3/uL (ref 142–424)
POC Granulocyte: 6.9 (ref 2–6.9)
POC LYMPH PERCENT: 30.6 %L (ref 10–50)
POC MID %: 6 % (ref 0–12)
RBC: 5.47 M/uL (ref 4.69–6.13)
RDW, POC: 14 %
WBC: 10.9 10*3/uL — AB (ref 4.6–10.2)

## 2017-07-10 LAB — POC INFLUENZA A&B (BINAX/QUICKVUE)
Influenza A, POC: NEGATIVE
Influenza B, POC: NEGATIVE

## 2017-07-10 MED ORDER — BENZONATATE 100 MG PO CAPS: 100.0000 mg | ORAL_CAPSULE | Freq: Three times a day (TID) | ORAL | 0 refills | Status: DC | PRN

## 2017-07-10 NOTE — Progress Notes (Signed)
     IF you received an x-ray today, you will receive an invoice from [facility]. Please contact [facility] at [phone] with questions or concerns regarding your invoice.   IF you received labwork today, you will receive an invoice from [facility]. Please contact [facility] at [phone] with questions or concerns regarding your invoice.   Our billing staff will not be able to assist you with questions regarding bills from these companies.  You will be contacted with the lab results as soon as they are available. The fastest way to get your results is to activate your My Chart account. Instructions are located on the last page of this paperwork. If you have not heard from us regarding the results in 2 weeks, please contact this office.     

## 2017-07-10 NOTE — Patient Instructions (Addendum)
-   Your chest xray was normal. We will treat this as a respiratory viral infection.  - I recommend you rest, drink plenty of fluids, eat light meals including soups.  - You may use delsym cough syrup at night for your cough and sore throat, Tessalon pearls during the day.  - You may also use Tylenol or ibuprofen over-the-counter for your sore throat.  - Please let me know if you are not seeing any improvement or get worse in 3-5 days or if your symptoms worsen.   Cough, Adult A cough helps to clear your throat and lungs. A cough may last only 2-3 weeks (acute), or it may last longer than 8 weeks (chronic). Many different things can cause a cough. A cough may be a sign of an illness or another medical condition. Follow these instructions at home:  Pay attention to any changes in your cough.  Take medicines only as told by your doctor. ? If you were prescribed an antibiotic medicine, take it as told by your doctor. Do not stop taking it even if you start to feel better. ? Talk with your doctor before you try using a cough medicine.  Drink enough fluid to keep your pee (urine) clear or pale yellow.  If the air is dry, use a cold steam vaporizer or humidifier in your home.  Stay away from things that make you cough at work or at home.  If your cough is worse at night, try using extra pillows to raise your head up higher while you sleep.  Do not smoke, and try not to be around smoke. If you need help quitting, ask your doctor.  Do not have caffeine.  Do not drink alcohol.  Rest as needed. Contact a doctor if:  You have new problems (symptoms).  You cough up yellow fluid (pus).  Your cough does not get better after 2-3 weeks, or your cough gets worse.  Medicine does not help your cough and you are not sleeping well.  You have pain that gets worse or pain that is not helped with medicine.  You have a fever.  You are losing weight and you do not know why.  You have night  sweats. Get help right away if:  You cough up blood.  You have trouble breathing.  Your heartbeat is very fast. This information is not intended to replace advice given to you by your health care provider. Make sure you discuss any questions you have with your health care provider. Document Released: 03/28/2011 Document Revised: 12/21/2015 Document Reviewed: 09/21/2014 Elsevier Interactive Patient Education  [employer].

## 2017-07-10 NOTE — Progress Notes (Signed)
MRN: [id number] DOB: 01-15-1951  Subjective:   Samuel Chambers is a 66 y.o. male presenting for chief complaint of Cough .  Reports 3 day history of productive cough (with thick brown sputum production), fatigue, sore throat, and subjective fever. Has tried OTC cough medicine, took 2 doses of cough medicine this morning with no full relief. Denies sinus congestion, sinus pain, rhinorrhea, ear pain, wheezing, shortness of breath and chest pain, night sweats, chills, nausea, vomiting, abdominal pain and diarrhea. No sick contacts. Denies history of seasonal allergies, no history of asthma. Has hx of pneumonia. Patient has not had flu shot this season. Denies smoking. Denies any other aggravating or relieving factors, no other questions or concerns.  Samuel Chambers has a current medication list which includes the following prescription(s): clopidogrel, duloxetine, furosemide, hydrochlorothiazide, losartan, metoprolol succinate, pantoprazole, sildenafil, and benzonatate. Also is allergic to gabapentin; crestor [rosuvastatin]; oxycodone; prednisone; valsartan; and warfarin and related.  Samuel Chambers  has a past medical history of Adenomatous colon polyp (03/2005), Anxiety, CAD S/P percutaneous coronary angioplasty (2008), Degenerative joint disease, Diverticulosis, Fibromyalgia, GERD (gastroesophageal reflux disease), Hyperlipidemia, Hypertension, Internal hemorrhoids, Obesity, Class II, BMI 35-39.9, and PONV (postoperative nausea and vomiting). Also  has a past surgical history that includes hip surgery; Heel spur surgery; Hemorrhoid surgery; Knee arthroscopy (03/27/2012); Total knee arthroplasty (Left, 09/04/2012); Cholecystectomy (N/A, 02/18/2013); doppler echocardiography (05/27/2002); NM MYOCAR PERF WALL MOTION (12/19/2011); renal dopplers (04/08/2007); Coronary angioplasty with stent (04/30/2007); Holter Monitor (04/07/2007); and Colonoscopy.   Objective:   Vitals: BP (!) 152/90 (BP Location: Left Arm, Patient  Position: Sitting, Cuff Size: Normal)   Pulse 75   Temp 99 F (37.2 C) (Oral)   Resp 16   Wt 275 lb 9.6 oz (125 kg)   SpO2 99%   BMI 38.99 kg/m   Physical Exam  Constitutional: He is oriented to person, place, and time. He appears well-developed and well-nourished. No distress.  HENT:  Head: Normocephalic and atraumatic.  Right Ear: Tympanic membrane is not erythematous and not bulging. A middle ear effusion is present.  Left Ear: Tympanic membrane is not erythematous and not bulging. A middle ear effusion is present.  Nose: Nose normal. Right sinus exhibits no maxillary sinus tenderness and no frontal sinus tenderness. Left sinus exhibits no maxillary sinus tenderness and no frontal sinus tenderness.  Mouth/Throat: Uvula is midline and mucous membranes are normal. Posterior oropharyngeal erythema present. Tonsils are 2+ on the right. Tonsils are 2+ on the left. No tonsillar exudate.  Eyes: Conjunctivae are normal.  Neck: Normal range of motion.  Cardiovascular: Normal rate, regular rhythm and normal heart sounds.  Pulmonary/Chest: Effort normal. No respiratory distress. He has no wheezes. He has no rhonchi. He has no rales.  Lymphadenopathy:       Head (right side): No submental, no submandibular, no tonsillar, no preauricular, no posterior auricular and no occipital adenopathy present.       Head (left side): No submental, no submandibular, no tonsillar, no preauricular, no posterior auricular and no occipital adenopathy present.    He has no cervical adenopathy.       Right: No supraclavicular adenopathy present.       Left: No supraclavicular adenopathy present.  Neurological: He is alert and oriented to person, place, and time.  Skin: Skin is warm and dry.  Psychiatric: He has a normal mood and affect.  Vitals reviewed.  Results for orders placed or performed in visit on 07/10/17 (from the past 48 hour(s))  POCT CBC  Status: Abnormal   Collection Time: 07/10/17  2:06 PM    Result Value Ref Range   WBC 10.9 (A) 4.6 - 10.2 K/uL   Lymph, poc 3.3 0.6 - 3.4   POC LYMPH PERCENT 30.6 10 - 50 %L   MID (cbc) 0.7 0 - 0.9   POC MID % 6.0 0 - 12 %M   POC Granulocyte 6.9 2 - 6.9   Granulocyte percent 63.4 37 - 80 %G   RBC 5.47 4.69 - 6.13 M/uL   Hemoglobin 15.4 14.1 - 18.1 g/dL   HCT, POC 46.0 43.5 - 53.7 %   MCV 84.1 80 - 97 fL   MCH, POC 28.2 27 - 31.2 pg   MCHC 33.5 31.8 - 35.4 g/dL   RDW, POC 14.0 %   Platelet Count, POC 264 142 - 424 K/uL   MPV 7.2 0 - 99.8 fL  POC Influenza A&B(BINAX/QUICKVUE)     Status: None   Collection Time: 07/10/17  4:34 PM  Result Value Ref Range   Influenza A, POC Negative Negative   Influenza B, POC Negative Negative   Dg Chest 2 View  Result Date: 07/10/2017 CLINICAL DATA:  Cough and fever EXAM: CHEST  2 VIEW COMPARISON:  June 10, 2016 FINDINGS: There is no edema or consolidation. The heart size and pulmonary vascularity are normal. No adenopathy. No bone lesions. IMPRESSION: No edema or consolidation. Electronically Signed   By: Lowella Grip III M.D.   On: 07/10/2017 15:05     Assessment and Plan :  1. Cough - DG Chest 2 View; Future - POCT CBC - benzonatate (TESSALON) 100 MG capsule; Take 1-2 capsules (100-200 mg total) by mouth 3 (three) times daily as needed for cough.  Dispense: 40 capsule; Refill: 0 2. Sore throat 3. Fever, unspecified fever cause - POC Influenza A&B(BINAX/QUICKVUE) 4. Acute upper respiratory infection Patient is well-appearing, no distress.  Vitals are stable.  Lungs are CTAB. WBC mildly elevated at 10.9.  Point-of-care flu negative. Plain films with no edema or consolidation.  Patient educated this is likely viral etiology.  Recommended symptomatic treatment at this time. Advised to return to clinic if symptoms worsen, do not improve in 3-5 days, or as needed  Tenna Delaine, PA-C  [facility] [place] 07/12/2017 1:10 PM

## 2017-07-12 ENCOUNTER — Encounter: Payer: Self-pay | Admitting: Physician Assistant

## 2017-07-12 NOTE — Progress Notes (Signed)
Thanks MCr 

## 2017-07-14 ENCOUNTER — Telehealth: Payer: Self-pay

## 2017-07-14 ENCOUNTER — Telehealth: Payer: Self-pay | Admitting: *Deleted

## 2017-07-14 MED ORDER — AZITHROMYCIN 250 MG PO TABS: ORAL_TABLET | ORAL | 0 refills | Status: DC

## 2017-07-14 NOTE — Telephone Encounter (Signed)
Copied from [place]. Topic: General - Other >> Jul 14, 2017  8:12 AM Carolyn Stare wrote:  Pt call to say that Tenna Delaine and she told him that if he was no better to call back and she would call him in something else  Pharmacy [facility] at [address]

## 2017-07-14 NOTE — Telephone Encounter (Signed)
Please call patient and  let him know I have electronically sent in an antibiotic for his cough.  It was sent to [facility] on [address].  Please have him return to office if he has no improvement after taking the antibiotic or if symptoms worsen. Thanks!

## 2017-07-14 NOTE — Addendum Note (Signed)
Addended by: Tenna Delaine D on: 07/14/2017 09:13 AM   Modules accepted: Orders

## 2017-07-14 NOTE — Telephone Encounter (Signed)
Spoke with patient he is upset prednisone will not be called in for him. Advised patient that Timmothy Euler stated he was not wheezing when he came in and prednisone was not warranted.  Advise patient that if he feels he is wheezing he would need to be reevaluated for prescription.   Patient stated he will find a new doctor.

## 2017-07-24 ENCOUNTER — Encounter: Payer: Self-pay | Admitting: Physician Assistant

## 2017-07-24 ENCOUNTER — Ambulatory Visit (INDEPENDENT_AMBULATORY_CARE_PROVIDER_SITE_OTHER): Payer: Medicare Other | Admitting: Physician Assistant

## 2017-07-24 ENCOUNTER — Other Ambulatory Visit: Payer: Self-pay

## 2017-07-24 ENCOUNTER — Ambulatory Visit (INDEPENDENT_AMBULATORY_CARE_PROVIDER_SITE_OTHER): Payer: Medicare Other

## 2017-07-24 VITALS — BP 132/80 | HR 91 | Temp 98.3°F | Resp 16 | Ht 71.0 in | Wt 280.6 lb

## 2017-07-24 DIAGNOSIS — J320 Chronic maxillary sinusitis: Secondary | ICD-10-CM

## 2017-07-24 DIAGNOSIS — R059 Cough, unspecified: Secondary | ICD-10-CM

## 2017-07-24 DIAGNOSIS — R05 Cough: Secondary | ICD-10-CM | POA: Diagnosis not present

## 2017-07-24 LAB — POCT CBC
Granulocyte percent: 63.7 %G (ref 37–80)
HCT, POC: 43.8 % (ref 43.5–53.7)
Hemoglobin: 14.5 g/dL (ref 14.1–18.1)
Lymph, poc: 2.7 (ref 0.6–3.4)
MCH, POC: 28.3 pg (ref 27–31.2)
MCHC: 33.1 g/dL (ref 31.8–35.4)
MCV: 85.5 fL (ref 80–97)
MID (cbc): 0.5 (ref 0–0.9)
MPV: 6.8 fL (ref 0–99.8)
POC Granulocyte: 5.6 (ref 2–6.9)
POC LYMPH PERCENT: 30.8 %L (ref 10–50)
POC MID %: 5.5 % (ref 0–12)
Platelet Count, POC: 257 10*3/uL (ref 142–424)
RBC: 5.12 M/uL (ref 4.69–6.13)
RDW, POC: 14.2 %
WBC: 8.8 10*3/uL (ref 4.6–10.2)

## 2017-07-24 MED ORDER — HYDROCOD POLST-CPM POLST ER 10-8 MG/5ML PO SUER: 5.0000 mL | Freq: Two times a day (BID) | ORAL | 0 refills | Status: DC | PRN

## 2017-07-24 MED ORDER — AMOXICILLIN-POT CLAVULANATE 875-125 MG PO TABS: 1.0000 | ORAL_TABLET | Freq: Two times a day (BID) | ORAL | 0 refills | Status: DC

## 2017-07-24 MED ORDER — PREDNISONE 20 MG PO TABS: 20.0000 mg | ORAL_TABLET | Freq: Two times a day (BID) | ORAL | 0 refills | Status: AC

## 2017-07-24 NOTE — Patient Instructions (Signed)
     IF you received an x-ray today, you will receive an invoice from [facility]. Please contact [facility] at [phone] with questions or concerns regarding your invoice.   IF you received labwork today, you will receive an invoice from [facility]. Please contact [facility] at [phone] with questions or concerns regarding your invoice.   Our billing staff will not be able to assist you with questions regarding bills from these companies.  You will be contacted with the lab results as soon as they are available. The fastest way to get your results is to activate your My Chart account. Instructions are located on the last page of this paperwork. If you have not heard from us regarding the results in 2 weeks, please contact this office.     

## 2017-07-24 NOTE — Progress Notes (Signed)
Samuel Chambers  MRN: [id number] DOB: [birth year]  PCP: Sanda Klein, MD  Subjective:  Pt is a 66 year old male who presents to clinic for cough x 3 weeks.  He was here 12/13 for the same c/c and treated with tessalon. Later called in azithromycin.  He felt better while taking Azithromycin, however cough has worsened over the past week. Endorses sweating. Denies fever, chills, palpitations, chest pain.   Review of Systems  Constitutional: Positive for diaphoresis. Negative for chills, fatigue and fever.  HENT: Positive for postnasal drip, rhinorrhea and sinus pressure. Negative for congestion, sinus pain and sore throat.   Respiratory: Positive for cough and shortness of breath. Negative for chest tightness and wheezing.   Cardiovascular: Negative for chest pain and palpitations.  Psychiatric/Behavioral: Positive for sleep disturbance.    Patient Active Problem List   Diagnosis Date Noted  . Chest pain with moderate risk for cardiac etiology 11/15/2015  . Upper airway cough syndrome 06/06/2015  . Severe obesity (BMI 35.0-35.9 with comorbidity) ([place]) 08/04/2014  . Hyperlipidemia with target LDL less than 70; statin intolerant 04/29/2013  . CAD S/P percutaneous coronary angioplasty   . S/P laparoscopic cholecystectomy July 2014 02/18/2013  . Gallstones 01/28/2013  . OA (osteoarthritis) of knee 09/04/2012  . Acute medial meniscal tear 03/27/2012  . Anxiety state 02/24/2007  . Essential hypertension 02/24/2007  . GERD 02/24/2007  . DEGENERATIVE JOINT DISEASE, GENERALIZED 02/24/2007    Current Outpatient Medications on File Prior to Visit  Medication Sig Dispense Refill  . benzonatate (TESSALON) 100 MG capsule Take 1-2 capsules (100-200 mg total) by mouth 3 (three) times daily as needed for cough. 40 capsule 0  . clopidogrel (PLAVIX) 75 MG tablet Take 1 tablet (75 mg total) by mouth daily. 90 tablet 3  . DULoxetine (CYMBALTA) 30 MG capsule Take 2 capsules (60 mg total) by mouth  daily. 120 capsule 2  . furosemide (LASIX) 20 MG tablet Take 1 tablet (20 mg total) by mouth daily. 90 tablet 3  . hydrochlorothiazide (HYDRODIURIL) 25 MG tablet Take 1 tablet (25 mg total) by mouth daily. 90 tablet 3  . losartan (COZAAR) 100 MG tablet Take 1 tablet (100 mg total) by mouth daily. ! 90 tablet 3  . metoprolol succinate (TOPROL-XL) 50 MG 24 hr tablet Take 1 tablet (50 mg total) by mouth daily. Take with or immediately following a meal. 90 tablet 3  . pantoprazole (PROTONIX) 40 MG tablet TAKE 1 TABLET BY MOUTH EVERY DAY 30-60 MINUTES BEFORE FIRST MEAL 90 tablet 4  . sildenafil (VIAGRA) 50 MG tablet Take 1-2 tablets (50-100 mg total) by mouth daily as needed for erectile dysfunction. 1 hour (range: 30 minutes to 4 hours) before sexual activity (Patient not taking: Reported on 07/24/2017) 4 tablet 1   No current facility-administered medications on file prior to visit.     Allergies  Allergen Reactions  . Gabapentin     Pt reports increased heart rate and blood pressure  . Crestor [Rosuvastatin] Other (See Comments)    myalgias  . Oxycodone Other (See Comments)    Hallucinations-ok in small doses  . Prednisone Other (See Comments)    Increased BP  . Valsartan Other (See Comments)    Made patient feel tired and no energy  . Warfarin And Related Other (See Comments)    Fast heart beat, went down hill, felt like he was dying     Objective:  BP 132/80   Pulse 91   Temp 98.3 F (  36.8 C) (Oral)   Resp 16   Ht 5\' 11"  (1.803 m)   Wt 280 lb 9.6 oz (127.3 kg)   SpO2 97%   BMI 39.14 kg/m   Physical Exam  Constitutional: He is oriented to person, place, and time and well-developed, well-nourished, and in no distress. No distress.  HENT:  Right Ear: Tympanic membrane is bulging.  Left Ear: Tympanic membrane is bulging.  Nose: Mucosal edema present. No rhinorrhea. Right sinus exhibits maxillary sinus tenderness. Right sinus exhibits no frontal sinus tenderness. Left sinus  exhibits maxillary sinus tenderness. Left sinus exhibits no frontal sinus tenderness.  Mouth/Throat: Oropharynx is clear and moist and mucous membranes are normal.  Cardiovascular: Normal rate, regular rhythm and normal heart sounds.  Pulmonary/Chest: Effort normal and breath sounds normal. No respiratory distress. He has no wheezes. He has no rales.  Lymphadenopathy:    He has no cervical adenopathy.  Neurological: He is alert and oriented to person, place, and time. GCS score is 15.  Skin: Skin is warm and dry.  Psychiatric: Mood, memory, affect and judgment normal.  Vitals reviewed.   Results for orders placed or performed in visit on 07/24/17  POCT CBC  Result Value Ref Range   WBC 8.8 4.6 - 10.2 K/uL   Lymph, poc 2.7 0.6 - 3.4   POC LYMPH PERCENT 30.8 10 - 50 %L   MID (cbc) 0.5 0 - 0.9   POC MID % 5.5 0 - 12 %M   POC Granulocyte 5.6 2 - 6.9   Granulocyte percent 63.7 37 - 80 %G   RBC 5.12 4.69 - 6.13 M/uL   Hemoglobin 14.5 14.1 - 18.1 g/dL   HCT, POC 43.8 43.5 - 53.7 %   MCV 85.5 80 - 97 fL   MCH, POC 28.3 27 - 31.2 pg   MCHC 33.1 31.8 - 35.4 g/dL   RDW, POC 14.2 %   Platelet Count, POC 257 142 - 424 K/uL   MPV 6.8 0 - 99.8 fL   Dg Chest 2 View  Result Date: 07/24/2017 CLINICAL DATA:  Cough for 3 weeks, followup, history hypertension, obesity, fibromyalgia, coronary artery disease post angioplasty EXAM: CHEST  2 VIEW COMPARISON:  07/10/2017 FINDINGS: Normal heart size, mediastinal contours, and pulmonary vascularity. Minimal chronic bronchitic changes. Lungs otherwise clear. No pleural effusion or pneumothorax. No acute bony abnormalities. IMPRESSION: Minimal chronic bronchitic changes without infiltrate. Electronically Signed   By: Lavonia Dana M.D.   On: 07/24/2017 14:36    Assessment and Plan :  1. Maxillary sinusitis, unspecified chronicity - amoxicillin-clavulanate (AUGMENTIN) 875-125 MG tablet; Take 1 tablet by mouth 2 (two) times daily.  Dispense: 20 tablet; Refill:  0 - Pt RTC for worsening cough x 3 weeks. WBC count nml, chest x-ray negative. Vitals are stable. Plan to treat for sinusitis.  2. Cough - DG Chest 2 View; Future - POCT CBC - predniSONE (DELTASONE) 20 MG tablet; Take 1 tablet (20 mg total) by mouth 2 (two) times daily for 5 days.  Dispense: 10 tablet; Refill: 0 - chlorpheniramine-HYDROcodone (TUSSIONEX PENNKINETIC ER) 10-8 MG/5ML SUER; Take 5 mLs by mouth every 12 (twelve) hours as needed for cough.  Dispense: 100 mL; Refill: 0   Whitney Tashe Purdon, PA-C  [facility] [place] 07/24/2017 2:00 PM

## 2017-08-01 NOTE — Telephone Encounter (Signed)
disregard

## 2017-08-06 ENCOUNTER — Other Ambulatory Visit: Payer: Self-pay

## 2017-08-06 ENCOUNTER — Encounter: Payer: Self-pay | Admitting: Physician Assistant

## 2017-08-06 ENCOUNTER — Ambulatory Visit (INDEPENDENT_AMBULATORY_CARE_PROVIDER_SITE_OTHER): Payer: Medicare Other | Admitting: Physician Assistant

## 2017-08-06 VITALS — BP 114/78 | HR 118 | Temp 98.3°F | Resp 16 | Ht 69.0 in | Wt 283.2 lb

## 2017-08-06 DIAGNOSIS — R059 Cough, unspecified: Secondary | ICD-10-CM

## 2017-08-06 DIAGNOSIS — J069 Acute upper respiratory infection, unspecified: Secondary | ICD-10-CM | POA: Diagnosis not present

## 2017-08-06 DIAGNOSIS — R05 Cough: Secondary | ICD-10-CM | POA: Diagnosis not present

## 2017-08-06 NOTE — Patient Instructions (Addendum)
1) Drink less sweet tea and more water!  2) Continue taking Mucinex every 12 hours for the next 5 days. You need to stay well hydrated while taking Mucinex. 3) Drink at least 1-2 liters of water daily.  4) Buy a [employer] and start using this at least twice daily for the next 5-7 days... Or longer if you love it! (see below) 5) Use your flonase every 6 hours as needed for nasal congestion.   ACUTE VIRAL RHINOSINUSITIS-Patients with acute viral rhinosinusitis (AVRS) should be managed with supportive care. There are no treatments to shorten the clinical course of the disease.  Natural history-AVRS may not completely resolve within 10 days but is expected to improve. Patients who fail to improve after ?10 days of symptomatic management are more likely to have acute bacterial rhinosinusitis. Symptomatic therapies-Symptomatic management of acute rhinosinusitis (ARS) aims to relieve symptoms of nasal obstruction and runny nose as well as the systemic signs and symptoms such as fever and fatigue. When needed, we suggest over-the-counter (OTC) analgesics and antipyretics, saline irrigation, and intranasal glucocorticoids for symptomatic management in patients with ARS. Analgesics and antipyretics-OTC analgesics and antipyretics such as nonsteroidal anti-inflammatory drugs and acetaminophen can be used for pain and fever relief as needed. Saline irrigation-Mechanical irrigation with saline may reduce the need for pain medication and improve overall patient comfort, particularly in patients with frequent sinus infections. It is important that irrigants be prepared from sterile or bottled water. (See below for instructions)  Intranasal glucocorticoids- Intranasal glucocorticoids are likely to be most beneficial for patients with underlying allergies. This allows improved sinus drainage. A higher dose of intranasal glucocorticoids had a stronger effect on symptom improvement. -Other- ?Oral decongestants  - Oral decongestants may be useful when eustachian tube dysfunction is a factor for patients with AVRS. These patients may benefit from a short course (three to five days) of oral decongestants. Oral decongestants should be used with caution in patients with cardiovascular disease, hypertension, angle-closure glaucoma, or bladder neck obstruction. ?Intranasal decongestants - Intranasal decongestants are often used as symptomatic therapies by patients. These agents, such as oxymetazoline, may provide a subjective sense of improved nasal patency. There is also concern that intranasal decongestants themselves may provoke mucosal inflammation. If used, topical decongestants should be used sparingly for no more than three consecutive days to avoid rebound congestion, addiction, and mucosal damage associated with long-term use. ?Antihistamines - Antihistamines are frequently used for symptom relief due to their drying effects; however, there are no studies investigating their efficacy for ARS. Over-drying of the mucosa may lead to further discomfort. Additionally, antihistamines are often associated with adverse effects.  ?Mucolytics - Mucolytics such as guaifenesin serve to thin secretions and may promote ease of mucus drainage and clearance.   SALINE NASAL IRRIGATION  The benefits  1. Saline (saltwater) washes the mucus and irritants from your nose.  2. The sinus passages are moisturized.  3. Studies have also shown that a nasal irrigation improves cell function (the cells that move the mucus work better).  The recipe  Use a one-quart glass jar that is thoroughly cleansed.  You may use a large medical syringe (30 cc), water pick with an irrigation tip (preferred method), squeeze bottle, or Neti pot. Do not use a baby bulb syringe. The syringe or pick should be sterilized frequently or replaced every two to three weeks to avoid contamination and infection.  Fill with water that has been distilled,  previously boiled, or otherwise sterilized. Plain tap water is not  recommended, because it is not necessarily sterile.  Add 1 to 1 heaping teaspoons of pickling/canning salt. Do not use table salt, because it contains a large number of additives.  Add 1 teaspoon of baking soda (pure bicarbonate).  Mix ingredients together, and store at room temperature. Discard after one week.  You may also make up a solution from premixed packets that are commercially prepared specifically for nasal irrigation.  The instructions  Irrigate your nose with saline one to two times per day.   If you have been told to use nasal medication, you should always use your saline solution first. The nasal medication is much more effective when sprayed onto clean nasal membranes, and the spray will reach deeper into the nose.   Pour the amount of fluid you plan to use into a clean bowl. Do not put your used syringe back into the storage container, because it contaminates your solution.   You may warm the solution slightly in the microwave, but be sure that the solution is not hot.   Bend over the sink (some people do this in the shower), and squirt the solution into each side of your nose, aiming the stream toward the back of your head, not the top of your head. The solution should flow into one nostril and out of the other, but it will not harm you if you swallow a little.   Some people experience a little burning sensation the first few times that they use buffered saline solution, but this usually goes away after they adapt to it.     Come back if you are not better in 10 days.   Thank you for coming in today. I hope you feel we met your needs.  Feel free to call PCP if you have any questions or further requests.  Please consider signing up for MyChart if you do not already have it, as this is a great way to communicate with me.  Best,  Whitney McVey, PA-C   IF you received an x-ray today, you will receive an  invoice from [facility]. Please contact [facility] at [phone] with questions or concerns regarding your invoice.   IF you received labwork today, you will receive an invoice from [facility]. Please contact [facility] at [phone] with questions or concerns regarding your invoice.   Our billing staff will not be able to assist you with questions regarding bills from these companies.  You will be contacted with the lab results as soon as they are available. The fastest way to get your results is to activate your My Chart account. Instructions are located on the last page of this paperwork. If you have not heard from [place] regarding the results in 2 weeks, please contact this office.

## 2017-08-06 NOTE — Progress Notes (Signed)
Samuel Chambers  MRN: [id number] DOB: 05-27-1951  PCP: Sanda Klein, MD  Subjective:  Pt is a 67 year old male who presents to clinic for cough x 2 days. Recently returned from a trip to [place] "I felt the best I have ever felt. I had no pain in my legs at all".  Cough is productive with brown mucus. Endorses sinus pressure. He has not taken anything to feel better.    Recently treated for sinusitis with prednisone and augmentin on 07/24/2017.  ROS below.  Review of Systems  Constitutional: Negative for chills, diaphoresis, fatigue and fever.  HENT: Positive for congestion, postnasal drip, rhinorrhea and sinus pressure. Negative for sinus pain, sore throat and tinnitus.   Respiratory: Positive for cough. Negative for chest tightness, shortness of breath and wheezing.   Cardiovascular: Negative for chest pain and palpitations.    Patient Active Problem List   Diagnosis Date Noted  . Chest pain with moderate risk for cardiac etiology 11/15/2015  . Upper airway cough syndrome 06/06/2015  . Severe obesity (BMI 35.0-35.9 with comorbidity) ([place]) 08/04/2014  . Hyperlipidemia with target LDL less than 70; statin intolerant 04/29/2013  . CAD S/P percutaneous coronary angioplasty   . S/P laparoscopic cholecystectomy July 2014 02/18/2013  . Gallstones 01/28/2013  . OA (osteoarthritis) of knee 09/04/2012  . Acute medial meniscal tear 03/27/2012  . Anxiety state 02/24/2007  . Essential hypertension 02/24/2007  . GERD 02/24/2007  . DEGENERATIVE JOINT DISEASE, GENERALIZED 02/24/2007    Current Outpatient Medications on File Prior to Visit  Medication Sig Dispense Refill  . benzonatate (TESSALON) 100 MG capsule Take 1-2 capsules (100-200 mg total) by mouth 3 (three) times daily as needed for cough. 40 capsule 0  . clopidogrel (PLAVIX) 75 MG tablet Take 1 tablet (75 mg total) by mouth daily. 90 tablet 3  . DULoxetine (CYMBALTA) 30 MG capsule Take 2 capsules (60 mg total) by mouth daily. 120  capsule 2  . furosemide (LASIX) 20 MG tablet Take 1 tablet (20 mg total) by mouth daily. 90 tablet 3  . hydrochlorothiazide (HYDRODIURIL) 25 MG tablet Take 1 tablet (25 mg total) by mouth daily. 90 tablet 3  . losartan (COZAAR) 100 MG tablet Take 1 tablet (100 mg total) by mouth daily. ! 90 tablet 3  . metoprolol succinate (TOPROL-XL) 50 MG 24 hr tablet Take 1 tablet (50 mg total) by mouth daily. Take with or immediately following a meal. 90 tablet 3  . pantoprazole (PROTONIX) 40 MG tablet TAKE 1 TABLET BY MOUTH EVERY DAY 30-60 MINUTES BEFORE FIRST MEAL 90 tablet 4  . sildenafil (VIAGRA) 50 MG tablet Take 1-2 tablets (50-100 mg total) by mouth daily as needed for erectile dysfunction. 1 hour (range: 30 minutes to 4 hours) before sexual activity (Patient not taking: Reported on 07/24/2017) 4 tablet 1   No current facility-administered medications on file prior to visit.     Allergies  Allergen Reactions  . Gabapentin     Pt reports increased heart rate and blood pressure  . Crestor [Rosuvastatin] Other (See Comments)    myalgias  . Oxycodone Other (See Comments)    Hallucinations-ok in small doses  . Prednisone Other (See Comments)    Increased BP  . Valsartan Other (See Comments)    Made patient feel tired and no energy  . Warfarin And Related Other (See Comments)    Fast heart beat, went down hill, felt like he was dying     Objective:  BP 114/78  Pulse (!) 118   Temp 98.3 F (36.8 C) (Oral)   Resp 16   Ht 5\' 9"  (1.753 m)   Wt 283 lb 3.2 oz (128.5 kg)   SpO2 97%   BMI 41.82 kg/m   Physical Exam  Constitutional: He is oriented to person, place, and time and well-developed, well-nourished, and in no distress. No distress.  HENT:  Right Ear: Tympanic membrane normal.  Left Ear: Tympanic membrane normal.  Nose: Mucosal edema present. No rhinorrhea. Right sinus exhibits no maxillary sinus tenderness and no frontal sinus tenderness. Left sinus exhibits no maxillary sinus  tenderness and no frontal sinus tenderness.  Mouth/Throat: Oropharynx is clear and moist and mucous membranes are normal.  Cardiovascular: Normal rate, regular rhythm and normal heart sounds.  Pulmonary/Chest: Effort normal and breath sounds normal. No respiratory distress. He has no wheezes. He has no rales.  Neurological: He is alert and oriented to person, place, and time. GCS score is 15.  Skin: Skin is warm and dry.  Psychiatric: Mood, memory, affect and judgment normal.  Vitals reviewed.   Assessment and Plan :  1. Acute upper respiratory infection 2. Cough - Pt presents with 2 days cough and sinus pressure. Suspect viral illness. Supportive care discussed - use Mucinex, neti pot, flonase, hydration. RTC in 10 days if no improvement. He understands and agrees with plan.    Mercer Pod, PA-C  [facility] 08/06/2017 3:45 PM

## 2017-08-08 ENCOUNTER — Other Ambulatory Visit: Payer: Self-pay | Admitting: Cardiovascular Disease

## 2017-09-08 ENCOUNTER — Other Ambulatory Visit: Payer: Self-pay | Admitting: Cardiovascular Disease

## 2017-10-14 ENCOUNTER — Ambulatory Visit (INDEPENDENT_AMBULATORY_CARE_PROVIDER_SITE_OTHER): Payer: Medicare Other

## 2017-10-14 ENCOUNTER — Encounter: Payer: Self-pay | Admitting: Physician Assistant

## 2017-10-14 ENCOUNTER — Other Ambulatory Visit: Payer: Self-pay

## 2017-10-14 ENCOUNTER — Ambulatory Visit: Payer: Medicare Other | Admitting: Physician Assistant

## 2017-10-14 ENCOUNTER — Telehealth: Payer: Self-pay

## 2017-10-14 VITALS — BP 130/80 | HR 112 | Ht 69.0 in | Wt 284.0 lb

## 2017-10-14 VITALS — BP 130/80 | HR 112 | Temp 98.1°F | Resp 18 | Ht 69.0 in | Wt 284.0 lb

## 2017-10-14 DIAGNOSIS — F411 Generalized anxiety disorder: Secondary | ICD-10-CM | POA: Diagnosis not present

## 2017-10-14 DIAGNOSIS — M25562 Pain in left knee: Secondary | ICD-10-CM

## 2017-10-14 DIAGNOSIS — Z1329 Encounter for screening for other suspected endocrine disorder: Secondary | ICD-10-CM

## 2017-10-14 DIAGNOSIS — M25561 Pain in right knee: Secondary | ICD-10-CM

## 2017-10-14 DIAGNOSIS — R972 Elevated prostate specific antigen [PSA]: Secondary | ICD-10-CM

## 2017-10-14 DIAGNOSIS — K219 Gastro-esophageal reflux disease without esophagitis: Secondary | ICD-10-CM

## 2017-10-14 DIAGNOSIS — Z0000 Encounter for general adult medical examination without abnormal findings: Secondary | ICD-10-CM | POA: Diagnosis not present

## 2017-10-14 MED ORDER — DULOXETINE HCL 60 MG PO CPEP: 60.0000 mg | ORAL_CAPSULE | Freq: Every day | ORAL | 3 refills | Status: DC

## 2017-10-14 MED ORDER — PANTOPRAZOLE SODIUM 40 MG PO TBEC: DELAYED_RELEASE_TABLET | ORAL | 4 refills | Status: DC

## 2017-10-14 MED ORDER — PREDNISONE 20 MG PO TABS: 20.0000 mg | ORAL_TABLET | Freq: Every day | ORAL | 1 refills | Status: DC

## 2017-10-14 NOTE — Progress Notes (Signed)
Subjective:   Samuel Chambers is a 67 y.o. male who presents for Medicare Annual/Subsequent preventive examination.  Review of Systems:  N/A Cardiac Risk Factors include: advanced age (>41men, >49 women);dyslipidemia;hypertension;obesity (BMI >30kg/m2);male gender     Objective:    Vitals: BP 130/80   Pulse (!) 112   Ht 5\' 9"  (1.753 m)   Wt 284 lb (128.8 kg)   SpO2 98%   BMI 41.94 kg/m   Body mass index is 41.94 kg/m.  Advanced Directives 10/14/2017 06/19/2015 09/03/2014 02/18/2013 02/15/2013 09/04/2012 08/28/2012  Does Patient Have a Medical Advance Directive? No No No Patient does not have advance directive Patient does not have advance directive Patient does not have advance directive Patient does not have advance directive  Would patient like information on creating a medical advance directive? No - Patient declined No - patient declined information No - patient declined information - - - -  Pre-existing out of facility DNR order (yellow form or pink MOST form) - - - No - No No    Tobacco Social History   Tobacco Use  Smoking Status Never Smoker  Smokeless Tobacco Never Used     Counseling given: Not Answered   Clinical Intake:  Pre-visit preparation completed: Yes  Pain : 0-10 Pain Score: 5  Pain Type: Chronic pain Pain Location: Leg Pain Orientation: Left, Right Pain Descriptors / Indicators: Aching Pain Onset: More than a month ago Pain Frequency: Constant     Nutritional Status: BMI > 30  Obese Nutritional Risks: None Diabetes: No  How often do you need to have someone help you when you read instructions, pamphlets, or other written materials from your doctor or pharmacy?: 1 - Never What is the last grade level you completed in school?: 12th grade  Interpreter Needed?: No  Information entered by :: Andrez Grime LPN  Past Medical History:  Diagnosis Date  . Adenomatous colon polyp 03/2005  . Anxiety   . CAD S/P percutaneous coronary angioplasty  2008   PCI to circumflex with a Promus DES 2.5 mm x 8 mm  . Degenerative joint disease   . Diverticulosis   . Fibromyalgia   . GERD (gastroesophageal reflux disease)   . Hyperlipidemia    statin intolerant  . Hypertension   . Internal hemorrhoids   . Obesity, Class II, BMI 35-39.9   . PONV (postoperative nausea and vomiting)    Past Surgical History:  Procedure Laterality Date  . CHOLECYSTECTOMY N/A 02/18/2013   Procedure: LAPAROSCOPIC CHOLECYSTECTOMY WITH INTRAOPERATIVE CHOLANGIOGRAM;  Surgeon: Pedro Earls, MD;  Location: [facility];  Service: General;  Laterality: N/A;  . COLONOSCOPY    . CORONARY ANGIOPLASTY WITH STENT PLACEMENT  04/30/2007   2.5 mm Promus stent that was to 95% Circ;had 60-70% lesion to RCA  . DOPPLER ECHOCARDIOGRAPHY  05/27/2002   [facility]normal EF 55-66%,  . HEEL SPUR SURGERY    . HEMORRHOID SURGERY    . hip surgery     complete  . Holter Monitor  04/07/2007   sinus tachy.;  . KNEE ARTHROSCOPY  03/27/2012   Procedure: ARTHROSCOPY KNEE;  Surgeon: Gearlean Alf, MD;  Location: [facility];  Service: Orthopedics;  Laterality: Left;  . NM MYOCAR PERF WALL MOTION  12/19/2011   EXERCISED FOR 8-1/2 MINUTES RECHING 10 METABOLIC EQUIVALENTS. NO EVIDENCE  OF ISCHEMIA OR INFARCTION . EF 71%  . renal dopplers  04/08/2007   relatively normal  . TOTAL KNEE ARTHROPLASTY Left 09/04/2012   Procedure: TOTAL KNEE  ARTHROPLASTY;  Surgeon: Gearlean Alf, MD;  Location: [facility];  Service: Orthopedics;  Laterality: Left;   Family History  Problem Relation Age of Onset  . Breast cancer Mother   . Diabetes Mother   . Prostate cancer Father   . Colon cancer Neg Hx    Social History   Socioeconomic History  . Marital status: Married    Spouse name: None  . Number of children: 1  . Years of education: None  . Highest education level: None  Social Needs  . Financial resource strain: Not hard at all  . Food insecurity - worry: Never true  . Food insecurity - inability:  Never true  . Transportation needs - medical: No  . Transportation needs - non-medical: No  Occupational History    Employer: [employer]  Tobacco Use  . Smoking status: Never Smoker  . Smokeless tobacco: Never Used  Substance and Sexual Activity  . Alcohol use: No  . Drug use: No  . Sexual activity: None  Other Topics Concern  . None  Social History Narrative   Very little 0-2 drinks a week    Outpatient Encounter Medications as of 10/14/2017  Medication Sig  . benzonatate (TESSALON) 100 MG capsule Take 1-2 capsules (100-200 mg total) by mouth 3 (three) times daily as needed for cough.  . clopidogrel (PLAVIX) 75 MG tablet TAKE 1 TABLET BY MOUTH EVERY DAY  . DULoxetine (CYMBALTA) 30 MG capsule Take 2 capsules (60 mg total) by mouth daily.  . furosemide (LASIX) 20 MG tablet Take 1 tablet (20 mg total) by mouth daily.  . hydrochlorothiazide (HYDRODIURIL) 25 MG tablet Take 1 tablet (25 mg total) by mouth daily.  Marland Kitchen losartan (COZAAR) 100 MG tablet Take 1 tablet (100 mg total) by mouth daily. !  . metoprolol succinate (TOPROL-XL) 50 MG 24 hr tablet Take 1 tablet (50 mg total) by mouth daily. Take with or immediately following a meal.  . pantoprazole (PROTONIX) 40 MG tablet TAKE 1 TABLET BY MOUTH EVERY DAY 30-60 MINUTES BEFORE FIRST MEAL  . [DISCONTINUED] metoprolol succinate (TOPROL-XL) 50 MG 24 hr tablet TAKE 1 TABLET BY MOUTH EVERY DAY  . [DISCONTINUED] sildenafil (VIAGRA) 50 MG tablet Take 1-2 tablets (50-100 mg total) by mouth daily as needed for erectile dysfunction. 1 hour (range: 30 minutes to 4 hours) before sexual activity (Patient not taking: Reported on 07/24/2017)   No facility-administered encounter medications on file as of 10/14/2017.     Activities of Daily Living In your present state of health, do you have any difficulty performing the following activities: 10/14/2017  Hearing? Y  Comment Patient has hearing aids  Vision? N  Difficulty concentrating or making  decisions? N  Walking or climbing stairs? Y  Comment Patient has chronic knee pain   Dressing or bathing? N  Doing errands, shopping? N  Preparing Food and eating ? N  Using the Toilet? N  In the past six months, have you accidently leaked urine? N  Do you have problems with loss of bowel control? N  Managing your Medications? N  Managing your Finances? N  Housekeeping or managing your Housekeeping? N  Some recent data might be hidden    Patient Care Team: McVey, Gelene Mink, PA-C as PCP - General (Physician Assistant) Ladene Artist, MD as Consulting Physician (Gastroenterology) Croitoru, Dani Gobble, MD as Consulting Physician (Cardiology) McVey, Gelene Mink, PA-C as Physician Assistant (Physician Assistant)   Assessment:   This is a routine wellness examination for  Samuel Chambers.  Exercise Activities and Dietary recommendations Current Exercise Habits: The patient does not participate in regular exercise at present(Patient has chronic knee pain and is unable to do exercise right now), Exercise limited by: None identified  Goals    . Weight (lb) < 220 lb (99.8 kg)     Patient states that he wants to try to lose weight and get to around 220 lbs in the future.        Fall Risk Fall Risk  10/14/2017 08/06/2017 07/24/2017 04/15/2017 09/16/2016  Falls in the past year? Yes No No No No  Number falls in past yr: 1 - - - -  Injury with Fall? Yes - - - -  Risk for fall due to : (No Data) - - - -  Risk for fall due to: Comment knee pain and knee gives out - - - -  Follow up Falls prevention discussed - - - -   Is the patient's home free of loose throw rugs in walkways, pet beds, electrical cords, etc?   yes      Grab bars in the bathroom? yes      Handrails on the stairs?   yes      Adequate lighting?   yes  Timed Get Up and Go Performed: yes, completed within 30 seconds  Depression Screen PHQ 2/9 Scores 10/14/2017 08/06/2017 07/24/2017 04/15/2017  PHQ - 2 Score 1 0 0 0     Cognitive Function     6CIT Screen 10/14/2017  What Year? 0 points  What month? 0 points  What time? 0 points  Count back from 20 0 points  Months in reverse 0 points  Repeat phrase 6 points  Total Score 6    Immunization History  Administered Date(s) Administered  . Influenza,inj,Quad PF,6+ Mos 09/02/2016  . Pneumococcal Conjugate-13 09/02/2016  . Tdap 02/13/2014    Qualifies for Shingles Vaccine? Patient declined Shingrix  Screening Tests Health Maintenance  Topic Date Due  . INFLUENZA VACCINE  10/26/2017 (Originally 02/26/2017)  . PNA vac Low Risk Adult (2 of 2 - PPSV23) 10/15/2018 (Originally 09/02/2017)  . COLONOSCOPY  11/30/2020  . TETANUS/TDAP  02/14/2024  . Hepatitis C Screening  Completed   Cancer Screenings: Lung: Low Dose CT Chest recommended if Age 31-80 years, 30 pack-year currently smoking OR have quit w/in 15 years. Patient does not qualify. Colorectal: colonoscopy completed 12/01/2015  Additional Screenings:  Hepatitis B/HIV/Syphillis: not indicated Hepatitis C Screening: completed 09/02/2016  Patient declined Pneumovax, flu and Shingrix vaccines.     Plan:   I have personally reviewed and noted the following in the patient's chart:   . Medical and social history . Use of alcohol, tobacco or illicit drugs  . Current medications and supplements . Functional ability and status . Nutritional status . Physical activity . Advanced directives . List of other physicians . Hospitalizations, surgeries, and ER visits in previous 12 months . Vitals . Screenings to include cognitive, depression, and falls . Referrals and appointments  In addition, I have reviewed and discussed with patient certain preventive protocols, quality metrics, and best practice recommendations. A written personalized care plan for preventive services as well as general preventive health recommendations were provided to patient. 1. Encounter for Medicare annual wellness  exam   Andrez Grime, LPN  5/99/3570

## 2017-10-14 NOTE — Progress Notes (Signed)
Samuel Chambers  MRN: [id number] DOB: Sep 19, 1950  PCP: Samuel Hiss, PA-C  Subjective:  Pt is a 67 year old male who presents to clinic for medication refill of protonix and cymbalta.   History of GERD-controlled with Protonix 40mg  daily  History of anxiety and leg pain- uncontrolled with Cymbalta 30mg . He endorses worsening bilateral leg pain the past month or 2.  This is not a new problem for him.  Nothing makes it better or worse.  Worsening urinary symptoms.  He has a difficult time completing his void.  Review of Systems  Constitutional: Negative for chills, diaphoresis, fatigue and fever.  Genitourinary: Positive for decreased urine volume and difficulty urinating. Negative for discharge, dysuria, enuresis, frequency, hematuria, penile pain, penile swelling, scrotal swelling, testicular pain and urgency.  Musculoskeletal: Positive for arthralgias and myalgias. Negative for back pain, gait problem and joint swelling.  Psychiatric/Behavioral: The patient is nervous/anxious.     Patient Active Problem List   Diagnosis Date Noted  . Chest pain with moderate risk for cardiac etiology 11/15/2015  . Upper airway cough syndrome 06/06/2015  . Severe obesity (BMI 35.0-35.9 with comorbidity) ([place]) 08/04/2014  . Hyperlipidemia with target LDL less than 70; statin intolerant 04/29/2013  . CAD S/P percutaneous coronary angioplasty   . S/P laparoscopic cholecystectomy July 2014 02/18/2013  . Gallstones 01/28/2013  . OA (osteoarthritis) of knee 09/04/2012  . Acute medial meniscal tear 03/27/2012  . Anxiety state 02/24/2007  . Essential hypertension 02/24/2007  . GERD 02/24/2007  . DEGENERATIVE JOINT DISEASE, GENERALIZED 02/24/2007    Current Outpatient Medications on File Prior to Visit  Medication Sig Dispense Refill  . benzonatate (TESSALON) 100 MG capsule Take 1-2 capsules (100-200 mg total) by mouth 3 (three) times daily as needed for cough. 40 capsule 0  . clopidogrel  (PLAVIX) 75 MG tablet TAKE 1 TABLET BY MOUTH EVERY DAY 90 tablet 0  . DULoxetine (CYMBALTA) 30 MG capsule Take 2 capsules (60 mg total) by mouth daily. 120 capsule 2  . furosemide (LASIX) 20 MG tablet Take 1 tablet (20 mg total) by mouth daily. 90 tablet 3  . hydrochlorothiazide (HYDRODIURIL) 25 MG tablet Take 1 tablet (25 mg total) by mouth daily. 90 tablet 3  . losartan (COZAAR) 100 MG tablet Take 1 tablet (100 mg total) by mouth daily. ! 90 tablet 3  . metoprolol succinate (TOPROL-XL) 50 MG 24 hr tablet Take 1 tablet (50 mg total) by mouth daily. Take with or immediately following a meal. 90 tablet 3  . pantoprazole (PROTONIX) 40 MG tablet TAKE 1 TABLET BY MOUTH EVERY DAY 30-60 MINUTES BEFORE FIRST MEAL 90 tablet 4   No current facility-administered medications on file prior to visit.     Allergies  Allergen Reactions  . Gabapentin     Pt reports increased heart rate and blood pressure  . Crestor [Rosuvastatin] Other (See Comments)    myalgias  . Oxycodone Other (See Comments)    Hallucinations-ok in small doses  . Prednisone Other (See Comments)    Increased BP  . Valsartan Other (See Comments)    Made patient feel tired and no energy  . Warfarin And Related Other (See Comments)    Fast heart beat, went down hill, felt like he was dying     Objective:  BP 130/80   Pulse (!) 112   Temp 98.1 F (36.7 C) (Oral)   Resp 18   Ht 5\' 9"  (1.753 m)   Wt 284 lb (128.8 kg)  SpO2 98%   BMI 41.94 kg/m   Physical Exam  Constitutional: He is oriented to person, place, and time and well-developed, well-nourished, and in no distress. No distress.  Cardiovascular: Normal rate, regular rhythm and normal heart sounds.  Musculoskeletal:       Right knee: He exhibits normal range of motion and no swelling. No tenderness found.       Left knee: He exhibits normal range of motion and no swelling. No tenderness found.       Right lower leg: He exhibits no tenderness, no bony tenderness and no  swelling.       Left lower leg: He exhibits no tenderness, no bony tenderness and no swelling.  Neurological: He is alert and oriented to person, place, and time. GCS score is 15.  Skin: Skin is warm and dry.  Psychiatric: Mood, memory, affect and judgment normal.  Vitals reviewed.   Assessment and Plan :  1. Gastroesophageal reflux disease without esophagitis - pantoprazole (PROTONIX) 40 MG tablet; TAKE 1 TABLET BY MOUTH EVERY DAY 30-60 MINUTES BEFORE FIRST MEAL  Dispense: 90 tablet; Refill: 4  2. Anxiety state - DULoxetine (CYMBALTA) 60 MG capsule; Take 1 capsule (60 mg total) by mouth daily.  Dispense: 90 capsule; Refill: 3  3. Arthralgia of both lower legs - Sedimentation Rate - C-reactive protein - predniSONE (DELTASONE) 20 MG tablet; Take 1 tablet (20 mg total) by mouth daily with breakfast.  Dispense: 30 tablet; Refill: 1 - Pt has long h/o b/l lower leg pain not associated with muscle or bone tenderness. Cymbalta is not controlling his symptoms. Will try a several-month long taper of prednisone to try and relieve his symptoms. Plan to start on 20mg  qd. When symptoms are relieved, con't this dose x 2-4 weeks. Then plan to taper down by 2.5mg  over the next several months until 10mg . At that time, plan to taper down by 1mg . Will try and find a low dose that controls his pain. Side effects discussed with pt. He agrees with plan.  4. Elevated PSA - PSA -Patient complains of worsening urinary symptoms.  He has a history of elevated PSA.  We will recheck today and consider urology referral. 5. Screening for thyroid disorder - Thyroid Panel With TSH   Samuel Pod, PA-C  [facility] 10/14/2017 3:11 PM

## 2017-10-14 NOTE — Patient Instructions (Addendum)
  Prednisone 876m take this once daily with meals. Continue taking this dose for 2- 4 weeks after aching and stiffness have resolved.  Let me know via MyChart when you start to feel achiness improving (that means you will have another 2-3 weeks of 278mtreatment). At that time, I will fill Prednisone dose to decrease by 2.76m33mn 2-4 week intervals.    Thank you for coming in today. I hope you feel we met your needs.  Feel free to call PCP if you have any questions or further requests.  Please consider signing up for MyChart if you do not already have it, as this is a great way to communicate with me.  Best,  Whitney McVey, PA-C    IF you received an x-ray today, you will receive an invoice from [facility]. Please contact [facility] at [phone] questions or concerns regarding your invoice.   IF you received labwork today, you will receive an invoice from [facility] contact [facility] at [phone] questions or concerns regarding your invoice.   Our billing staff will not be able to assist you with questions regarding bills from these companies.  You will be contacted with the lab results as soon as they are available. The fastest way to get your results is to activate your My Chart account. Instructions are located on the last page of this paperwork. If you have not heard from us [place] the results in 2 weeks, please contact this office.

## 2017-10-14 NOTE — Telephone Encounter (Signed)
Pt request McVey fill out Placard. I left a message on pts cell that paperwork is ready for pickup at the [address]. I placed the paperwork in an envelope and in the Rx pick-up box.

## 2017-10-14 NOTE — Patient Instructions (Signed)
Samuel Chambers , Thank you for taking time to come for your Medicare Wellness Visit. I appreciate your ongoing commitment to your health goals. Please review the following plan we discussed and let me know if I can assist you in the future.   Screening recommendations/referrals: Colonoscopy: up to date, next due 11/30/2025 Recommended yearly ophthalmology/optometry visit for glaucoma screening and checkup Recommended yearly dental visit for hygiene and checkup  Vaccinations: Influenza vaccine: declined Pneumococcal vaccine: declined  Tdap vaccine: up to date, next due 02/14/2024 Shingles vaccine: declined    Advanced directives: Advance directive discussed with you today. Even though you declined this today please call our office should you change your mind and we can give you the proper paperwork for you to fill out.   Conditions/risks identified:Try to lose weight and get to around 220 lbs in the future.    Next appointment: today @ 2:40 pm with Juanda Crumble, 1 year for next AWV  Preventive Care 13 Years and Older, Male Preventive care refers to lifestyle choices and visits with your health care provider that can promote health and wellness. What does preventive care include?  A yearly physical exam. This is also called an annual well check.  Dental exams once or twice a year.  Routine eye exams. Ask your health care provider how often you should have your eyes checked.  Personal lifestyle choices, including:  Daily care of your teeth and gums.  Regular physical activity.  Eating a healthy diet.  Avoiding tobacco and drug use.  Limiting alcohol use.  Practicing safe sex.  Taking low doses of aspirin every day.  Taking vitamin and mineral supplements as recommended by your health care provider. What happens during an annual well check? The services and screenings done by your health care provider during your annual well check will depend on your age, overall health,  lifestyle risk factors, and family history of disease. Counseling  Your health care provider may ask you questions about your:  Alcohol use.  Tobacco use.  Drug use.  Emotional well-being.  Home and relationship well-being.  Sexual activity.  Eating habits.  History of falls.  Memory and ability to understand (cognition).  Work and work Statistician. Screening  You may have the following tests or measurements:  Height, weight, and BMI.  Blood pressure.  Lipid and cholesterol levels. These may be checked every 5 years, or more frequently if you are over 10 years old.  Skin check.  Lung cancer screening. You may have this screening every year starting at age 30 if you have a 30-pack-year history of smoking and currently smoke or have quit within the past 15 years.  Fecal occult blood test (FOBT) of the stool. You may have this test every year starting at age 60.  Flexible sigmoidoscopy or colonoscopy. You may have a sigmoidoscopy every 5 years or a colonoscopy every 10 years starting at age 59.  Prostate cancer screening. Recommendations will vary depending on your family history and other risks.  Hepatitis C blood test.  Hepatitis B blood test.  Sexually transmitted disease (STD) testing.  Diabetes screening. This is done by checking your blood sugar (glucose) after you have not eaten for a while (fasting). You may have this done every 1-3 years.  Abdominal aortic aneurysm (AAA) screening. You may need this if you are a current or former smoker.  Osteoporosis. You may be screened starting at age 29 if you are at high risk. Talk with your health care provider about  your test results, treatment options, and if necessary, the need for more tests. Vaccines  Your health care provider may recommend certain vaccines, such as:  Influenza vaccine. This is recommended every year.  Tetanus, diphtheria, and acellular pertussis (Tdap, Td) vaccine. You may need a Td booster  every 10 years.  Zoster vaccine. You may need this after age 18.  Pneumococcal 13-valent conjugate (PCV13) vaccine. One dose is recommended after age 28.  Pneumococcal polysaccharide (PPSV23) vaccine. One dose is recommended after age 62. Talk to your health care provider about which screenings and vaccines you need and how often you need them. This information is not intended to replace advice given to you by your health care provider. Make sure you discuss any questions you have with your health care provider. Document Released: 08/11/2015 Document Revised: 04/03/2016 Document Reviewed: 05/16/2015 Elsevier Interactive Patient Education  2017 [place] Prevention in the Home Falls can cause injuries. They can happen to people of all ages. There are many things you can do to make your home safe and to help prevent falls. What can I do on the outside of my home?  Regularly fix the edges of walkways and driveways and fix any cracks.  Remove anything that might make you trip as you walk through a door, such as a raised step or threshold.  Trim any bushes or trees on the path to your home.  Use bright outdoor lighting.  Clear any walking paths of anything that might make someone trip, such as rocks or tools.  Regularly check to see if handrails are loose or broken. Make sure that both sides of any steps have handrails.  Any raised decks and porches should have guardrails on the edges.  Have any leaves, snow, or ice cleared regularly.  Use sand or salt on walking paths during winter.  Clean up any spills in your garage right away. This includes oil or grease spills. What can I do in the bathroom?  Use night lights.  Install grab bars by the toilet and in the tub and shower. Do not use towel bars as grab bars.  Use non-skid mats or decals in the tub or shower.  If you need to sit down in the shower, use a plastic, non-slip stool.  Keep the floor dry. Clean up any  water that spills on the floor as soon as it happens.  Remove soap buildup in the tub or shower regularly.  Attach bath mats securely with double-sided non-slip rug tape.  Do not have throw rugs and other things on the floor that can make you trip. What can I do in the bedroom?  Use night lights.  Make sure that you have a light by your bed that is easy to reach.  Do not use any sheets or blankets that are too big for your bed. They should not hang down onto the floor.  Have a firm chair that has side arms. You can use this for support while you get dressed.  Do not have throw rugs and other things on the floor that can make you trip. What can I do in the kitchen?  Clean up any spills right away.  Avoid walking on wet floors.  Keep items that you use a lot in easy-to-reach places.  If you need to reach something above you, use a strong step stool that has a grab bar.  Keep electrical cords out of the way.  Do not use floor polish or wax  that makes floors slippery. If you must use wax, use non-skid floor wax.  Do not have throw rugs and other things on the floor that can make you trip. What can I do with my stairs?  Do not leave any items on the stairs.  Make sure that there are handrails on both sides of the stairs and use them. Fix handrails that are broken or loose. Make sure that handrails are as long as the stairways.  Check any carpeting to make sure that it is firmly attached to the stairs. Fix any carpet that is loose or worn.  Avoid having throw rugs at the top or bottom of the stairs. If you do have throw rugs, attach them to the floor with carpet tape.  Make sure that you have a light switch at the top of the stairs and the bottom of the stairs. If you do not have them, ask someone to add them for you. What else can I do to help prevent falls?  Wear shoes that:  Do not have high heels.  Have rubber bottoms.  Are comfortable and fit you well.  Are closed  at the toe. Do not wear sandals.  If you use a stepladder:  Make sure that it is fully opened. Do not climb a closed stepladder.  Make sure that both sides of the stepladder are locked into place.  Ask someone to hold it for you, if possible.  Clearly mark and make sure that you can see:  Any grab bars or handrails.  First and last steps.  Where the edge of each step is.  Use tools that help you move around (mobility aids) if they are needed. These include:  Canes.  Walkers.  Scooters.  Crutches.  Turn on the lights when you go into a dark area. Replace any light bulbs as soon as they burn out.  Set up your furniture so you have a clear path. Avoid moving your furniture around.  If any of your floors are uneven, fix them.  If there are any pets around you, be aware of where they are.  Review your medicines with your doctor. Some medicines can make you feel dizzy. This can increase your chance of falling. Ask your doctor what other things that you can do to help prevent falls. This information is not intended to replace advice given to you by your health care provider. Make sure you discuss any questions you have with your health care provider. Document Released: 05/11/2009 Document Revised: 12/21/2015 Document Reviewed: 08/19/2014 Elsevier Interactive Patient Education  2017 [employer].

## 2017-10-15 LAB — C-REACTIVE PROTEIN: CRP: 3.4 mg/L (ref 0.0–4.9)

## 2017-10-15 LAB — THYROID PANEL WITH TSH
Free Thyroxine Index: 1.2 (ref 1.2–4.9)
T3 Uptake Ratio: 23 % — ABNORMAL LOW (ref 24–39)
T4, Total: 5.4 ug/dL (ref 4.5–12.0)
TSH: 3.47 u[IU]/mL (ref 0.450–4.500)

## 2017-10-15 LAB — PSA: Prostate Specific Ag, Serum: 5.1 ng/mL — ABNORMAL HIGH (ref 0.0–4.0)

## 2017-10-15 LAB — SEDIMENTATION RATE: Sed Rate: 14 mm/h (ref 0–30)

## 2017-10-16 ENCOUNTER — Other Ambulatory Visit: Payer: Self-pay | Admitting: Physician Assistant

## 2017-10-16 DIAGNOSIS — R972 Elevated prostate specific antigen [PSA]: Secondary | ICD-10-CM

## 2017-10-17 ENCOUNTER — Other Ambulatory Visit: Payer: Self-pay | Admitting: Cardiovascular Disease

## 2017-10-20 ENCOUNTER — Encounter: Payer: Self-pay | Admitting: Physician Assistant

## 2017-10-20 ENCOUNTER — Other Ambulatory Visit: Payer: Self-pay | Admitting: Physician Assistant

## 2017-10-20 DIAGNOSIS — F411 Generalized anxiety disorder: Secondary | ICD-10-CM

## 2017-10-22 ENCOUNTER — Encounter: Payer: Self-pay | Admitting: Physician Assistant

## 2017-10-23 ENCOUNTER — Other Ambulatory Visit: Payer: Self-pay | Admitting: Cardiovascular Disease

## 2017-10-24 NOTE — Telephone Encounter (Signed)
REFILL 

## 2017-11-01 ENCOUNTER — Encounter: Payer: Self-pay | Admitting: Physician Assistant

## 2017-11-05 ENCOUNTER — Encounter: Payer: Self-pay | Admitting: Physician Assistant

## 2017-11-07 ENCOUNTER — Other Ambulatory Visit: Payer: Self-pay | Admitting: Physician Assistant

## 2017-11-07 DIAGNOSIS — M25561 Pain in right knee: Secondary | ICD-10-CM

## 2017-11-07 DIAGNOSIS — M25562 Pain in left knee: Principal | ICD-10-CM

## 2017-11-07 MED ORDER — PREDNISONE 5 MG PO TABS: 5.0000 mg | ORAL_TABLET | Freq: Every day | ORAL | 1 refills | Status: DC

## 2017-11-07 MED ORDER — PREDNISONE 2.5 MG PO TABS: 2.5000 mg | ORAL_TABLET | Freq: Every day | ORAL | 0 refills | Status: DC

## 2017-11-07 MED ORDER — PREDNISONE 10 MG PO TABS: 10.0000 mg | ORAL_TABLET | Freq: Every day | ORAL | 1 refills | Status: DC

## 2017-11-20 DIAGNOSIS — R3915 Urgency of urination: Secondary | ICD-10-CM | POA: Diagnosis not present

## 2017-11-20 DIAGNOSIS — N5201 Erectile dysfunction due to arterial insufficiency: Secondary | ICD-10-CM | POA: Diagnosis not present

## 2017-11-20 DIAGNOSIS — R972 Elevated prostate specific antigen [PSA]: Secondary | ICD-10-CM | POA: Diagnosis not present

## 2017-11-20 DIAGNOSIS — N401 Enlarged prostate with lower urinary tract symptoms: Secondary | ICD-10-CM | POA: Diagnosis not present

## 2017-11-22 ENCOUNTER — Other Ambulatory Visit: Payer: Self-pay | Admitting: Cardiovascular Disease

## 2017-11-24 ENCOUNTER — Other Ambulatory Visit: Payer: Self-pay | Admitting: Cardiovascular Disease

## 2017-11-24 NOTE — Telephone Encounter (Signed)
Rx sent to pharmacy   

## 2017-12-02 ENCOUNTER — Ambulatory Visit (INDEPENDENT_AMBULATORY_CARE_PROVIDER_SITE_OTHER): Payer: Medicare Other | Admitting: Physician Assistant

## 2017-12-02 ENCOUNTER — Encounter: Payer: Self-pay | Admitting: Physician Assistant

## 2017-12-02 VITALS — BP 134/80 | HR 119 | Temp 98.8°F | Resp 17 | Ht 69.0 in | Wt 283.0 lb

## 2017-12-02 DIAGNOSIS — R05 Cough: Secondary | ICD-10-CM

## 2017-12-02 DIAGNOSIS — M25562 Pain in left knee: Secondary | ICD-10-CM | POA: Diagnosis not present

## 2017-12-02 DIAGNOSIS — M25561 Pain in right knee: Secondary | ICD-10-CM

## 2017-12-02 DIAGNOSIS — R059 Cough, unspecified: Secondary | ICD-10-CM

## 2017-12-02 DIAGNOSIS — J209 Acute bronchitis, unspecified: Secondary | ICD-10-CM

## 2017-12-02 DIAGNOSIS — R000 Tachycardia, unspecified: Secondary | ICD-10-CM | POA: Diagnosis not present

## 2017-12-02 MED ORDER — PREDNISONE 10 MG PO TABS: 10.0000 mg | ORAL_TABLET | Freq: Every day | ORAL | 0 refills | Status: DC

## 2017-12-02 MED ORDER — HYDROCODONE-HOMATROPINE 5-1.5 MG/5ML PO SYRP: 5.0000 mL | ORAL_SOLUTION | Freq: Three times a day (TID) | ORAL | 0 refills | Status: DC | PRN

## 2017-12-02 MED ORDER — AZITHROMYCIN 250 MG PO TABS: ORAL_TABLET | ORAL | 0 refills | Status: DC

## 2017-12-02 MED ORDER — PREDNISONE 5 MG PO TABS: 5.0000 mg | ORAL_TABLET | Freq: Every day | ORAL | 0 refills | Status: DC

## 2017-12-02 MED ORDER — BENZONATATE 100 MG PO CAPS: 100.0000 mg | ORAL_CAPSULE | Freq: Three times a day (TID) | ORAL | 0 refills | Status: DC | PRN

## 2017-12-02 NOTE — Progress Notes (Signed)
Samuel Chambers  MRN: [id number] DOB: January 08, 1951  PCP: Dorise Hiss, PA-C  Subjective:  Pt is a 67 year old male who presents to clinic for cough x > 1 week.  Cough is productive with brown sputum. Endorses shob, fatigue and night sweats which started a few nights ago. He is taking tessalon pearls, which are not helping.   He is currently being treated for possible PR with prednisone. He is almost out of his Prednisone 17.5mg  qd dose. Needs to titrate down to 15mg /day. He is feeling great. He is getting out and fishing almost daily. "when I feel good I can go out and fish"  Review of Systems  Constitutional: Positive for diaphoresis and fatigue. Negative for chills and fever.  HENT: Negative for congestion, postnasal drip, rhinorrhea, sinus pressure, sinus pain, sneezing and sore throat.   Respiratory: Positive for cough and shortness of breath. Negative for wheezing.   Cardiovascular: Negative for chest pain and palpitations.    Patient Active Problem List   Diagnosis Date Noted  . Chest pain with moderate risk for cardiac etiology 11/15/2015  . Upper airway cough syndrome 06/06/2015  . Severe obesity (BMI 35.0-35.9 with comorbidity) ([place]) 08/04/2014  . Hyperlipidemia with target LDL less than 70; statin intolerant 04/29/2013  . CAD S/P percutaneous coronary angioplasty   . S/P laparoscopic cholecystectomy July 2014 02/18/2013  . Gallstones 01/28/2013  . OA (osteoarthritis) of knee 09/04/2012  . Acute medial meniscal tear 03/27/2012  . Anxiety state 02/24/2007  . Essential hypertension 02/24/2007  . GERD 02/24/2007  . DEGENERATIVE JOINT DISEASE, GENERALIZED 02/24/2007    Current Outpatient Medications on File Prior to Visit  Medication Sig Dispense Refill  . benzonatate (TESSALON) 100 MG capsule Take 1-2 capsules (100-200 mg total) by mouth 3 (three) times daily as needed for cough. 40 capsule 0  . clopidogrel (PLAVIX) 75 MG tablet TAKE 1 TABLET BY MOUTH EVERY DAY  90 tablet 0  . DULoxetine (CYMBALTA) 60 MG capsule Take 1 capsule (60 mg total) by mouth daily. 90 capsule 3  . furosemide (LASIX) 20 MG tablet TAKE 1 TABLET BY MOUTH EVERY DAY 90 tablet 0  . hydrochlorothiazide (HYDRODIURIL) 25 MG tablet Take 1 tablet (25 mg total) by mouth daily. 90 tablet 3  . losartan (COZAAR) 100 MG tablet Take 1 tablet (100 mg total) by mouth daily. Please call to make appointment for further refills 30 tablet 0  . metoprolol succinate (TOPROL-XL) 50 MG 24 hr tablet Take 1 tablet (50 mg total) by mouth daily. Take with or immediately following a meal. 90 tablet 3  . pantoprazole (PROTONIX) 40 MG tablet TAKE 1 TABLET BY MOUTH EVERY DAY 30-60 MINUTES BEFORE FIRST MEAL 90 tablet 4  . predniSONE (DELTASONE) 10 MG tablet Take 1 tablet (10 mg total) by mouth daily with breakfast. Take with 7.5mg  for total of 17.5mg  qd. 30 tablet 1  . predniSONE (DELTASONE) 2.5 MG tablet Take 1 tablet (2.5 mg total) by mouth daily with breakfast. Take in addition to 15mg  for total of 17.5mg  qd. 30 tablet 0  . predniSONE (DELTASONE) 5 MG tablet Take 1 tablet (5 mg total) by mouth daily with breakfast. Take with 12.5mg  for total of 17.5mg  qd. 30 tablet 1   No current facility-administered medications on file prior to visit.     Allergies  Allergen Reactions  . Gabapentin     Pt reports increased heart rate and blood pressure  . Crestor [Rosuvastatin] Other (See Comments)  myalgias  . Oxycodone Other (See Comments)    Hallucinations-ok in small doses  . Prednisone Other (See Comments)    Increased BP  . Valsartan Other (See Comments)    Made patient feel tired and no energy  . Warfarin And Related Other (See Comments)    Fast heart beat, went down hill, felt like he was dying     Objective:  BP (!) 152/80   Pulse (!) 125   Temp 98.8 F (37.1 C) (Oral)   Resp 17   Ht 5\' 9"  (1.753 m)   Wt 283 lb (128.4 kg)   SpO2 98%   BMI 41.79 kg/m   Physical Exam  Constitutional: He is  oriented to person, place, and time. He appears well-developed and well-nourished.  Cardiovascular: Normal rate and regular rhythm.  Pulmonary/Chest: Effort normal. No stridor. No respiratory distress. He has no decreased breath sounds. He has no wheezes. He has rhonchi in the right lower field.  Neurological: He is alert and oriented to person, place, and time.  Skin: Skin is warm and dry.  Psychiatric: He has a normal mood and affect. His behavior is normal. Judgment and thought content normal.  Vitals reviewed.   Assessment and Plan :  1. Acute bronchitis, unspecified organism 2. Tachycardia 3. Cough - azithromycin (ZITHROMAX) 250 MG tablet; Take 2 tabs PO x 1 dose, then 1 tab PO QD x 4 days  Dispense: 6 tablet; Refill: 0 - Recheck vitals - HYDROcodone-homatropine (HYCODAN) 5-1.5 MG/5ML syrup; Take 5 mLs by mouth every 8 (eight) hours as needed for cough.  Dispense: 120 mL; Refill: 0 - benzonatate (TESSALON) 100 MG capsule; Take 1-2 capsules (100-200 mg total) by mouth 3 (three) times daily as needed for cough.  Dispense: 40 capsule; Refill: 0 - Pt presents with cough x >1 week. Adventitious sounds on PE and he is tachycardic at 119. Plan to cover with azithromycin. Stay well hydrated. RTC in one week if no improvement.  4. Arthralgia of both lower legs - predniSONE (DELTASONE) 10 MG tablet; Take 1 tablet (10 mg total) by mouth daily with breakfast. Take with 5mg  tab for total of 15mg /day  Dispense: 30 tablet; Refill: 0 - predniSONE (DELTASONE) 5 MG tablet; Take 1 tablet (5 mg total) by mouth daily with breakfast. Take with 10mg  for total of 15mg  qd.  Dispense: 30 tablet; Refill: 0 - Pt is currently being treated for possible polymyalgia rheumatica. He is almost out of Prednisone 17.5mg . Plan to start Prednisone 15mg  x 1 month. Refill sent. Taper schedule discussed with pt.   Mercer Pod, PA-C  [facility] 12/02/2017 10:56 AM

## 2017-12-02 NOTE — Patient Instructions (Addendum)
Start taking Azithromycin. Hycodan is for cough at night. This will make you drowsy. Do not take this with other over-the-counter nighttime cough medicines.  Tessalon as needed for cough.  Stay well hydrated. Drink 1-2 liters of water daily.  Come back if you aren't improving in 7-10 days.   Get lost of rest. Wash your hands often.   -Foods that can help speed recovery: honey, garlic, chicken soup, elderberries, green tea.  -Supplements that can help speed recovery: vitamin C, zinc, elderberry extract, quercetin, ginseng, selenium -Supplement with prebiotics and probiotics:   Cough Syrup Recipe: Sweet Lemon & Honey Thyme  Ingredients a handful of fresh thyme sprigs   1 pint of water (2 cups)  1/2 cup honey (raw is best, but regular will do)  1/2 lemon chopped Instructions 1. Place the lemon in the pint jar and cover with the honey. The honey will macerate the lemons and draw out liquids which taste so delicious! 2. Meanwhile, toss the thyme leaves into a saucepan and cover them with the water. 3. Bring the water to a gentle simmer and reduce it to half, about a cup of tea. 4. When the tea is reduced and cooled a bit, strain the sprigs & leaves, add it into the pint jar and stir it well. 5. Give it a shake and use a spoonful as needed. 6. Store your homemade cough syrup in the refrigerator for about a month.  Is there anything I can do on my own to get rid of my cough? Yes. To help get rid of your cough, you can: ?Use a humidifier in your bedroom ?Use an over-the-counter cough medicine, or suck on cough drops or hard candy ?Stop smoking, if you smoke ?If you have allergies, avoid the things you are allergic to (like pollen, dust, animals, or mold) If you have acid reflux, your doctor or nurse will tell you which lifestyle changes can help reduce symptoms.    Azithromycin tablets What is this medicine? AZITHROMYCIN (az ith roe MYE sin) is a macrolide antibiotic. It is used to  treat or prevent certain kinds of bacterial infections. It will not work for colds, flu, or other viral infections. This medicine may be used for other purposes; ask your health care provider or pharmacist if you have questions. COMMON BRAND NAME(S): Zithromax, Zithromax Tri-Pak, Zithromax Z-Pak What should I tell my health care provider before I take this medicine? They need to know if you have any of these conditions: -kidney disease -liver disease -irregular heartbeat or heart disease -an unusual or allergic reaction to azithromycin, erythromycin, other macrolide antibiotics, foods, dyes, or preservatives -pregnant or trying to get pregnant -breast-feeding How should I use this medicine? Take this medicine by mouth with a full glass of water. Follow the directions on the prescription label. The tablets can be taken with food or on an empty stomach. If the medicine upsets your stomach, take it with food. Take your medicine at regular intervals. Do not take your medicine more often than directed. Take all of your medicine as directed even if you think your are better. Do not skip doses or stop your medicine early. Talk to your pediatrician regarding the use of this medicine in children. While this drug may be prescribed for children as young as 6 months for selected conditions, precautions do apply. Overdosage: If you think you have taken too much of this medicine contact a poison control center or emergency room at once. NOTE: This medicine is only for  you. Do not share this medicine with others. What if I miss a dose? If you miss a dose, take it as soon as you can. If it is almost time for your next dose, take only that dose. Do not take double or extra doses. What may interact with this medicine? Do not take this medicine with any of the following medications: -lincomycin This medicine may also interact with the following medications: -amiodarone -antacids -birth control  pills -cyclosporine -digoxin -magnesium -nelfinavir -phenytoin -warfarin This list may not describe all possible interactions. Give your health care provider a list of all the medicines, herbs, non-prescription drugs, or dietary supplements you use. Also tell them if you smoke, drink alcohol, or use illegal drugs. Some items may interact with your medicine. What should I watch for while using this medicine? Tell your doctor or healthcare professional if your symptoms do not start to get better or if they get worse. Do not treat diarrhea with over the counter products. Contact your doctor if you have diarrhea that lasts more than 2 days or if it is severe and watery. This medicine can make you more sensitive to the sun. Keep out of the sun. If you cannot avoid being in the sun, wear protective clothing and use sunscreen. Do not use sun lamps or tanning beds/booths. What side effects may I notice from receiving this medicine? Side effects that you should report to your doctor or health care professional as soon as possible: -allergic reactions like skin rash, itching or hives, swelling of the face, lips, or tongue -confusion, nightmares or hallucinations -dark urine -difficulty breathing -hearing loss -irregular heartbeat or chest pain -pain or difficulty passing urine -redness, blistering, peeling or loosening of the skin, including inside the mouth -white patches or sores in the mouth -yellowing of the eyes or skin Side effects that usually do not require medical attention (report to your doctor or health care professional if they continue or are bothersome): -diarrhea -dizziness, drowsiness -headache -stomach upset or vomiting -tooth discoloration -vaginal irritation This list may not describe all possible side effects. Call your doctor for medical advice about side effects. You may report side effects to FDA at 1-800-FDA-1088. Where should I keep my medicine? Keep out of the reach  of children. Store at room temperature between 15 and 30 degrees C (59 and 86 degrees F). Throw away any unused medicine after the expiration date. NOTE: This sheet is a summary. It may not cover all possible information. If you have questions about this medicine, talk to your doctor, pharmacist, or health care provider.  2018 Elsevier/Gold Standard (2015-09-12 15:26:03)   Thank you for coming in today. I hope you feel we met your needs.  Feel free to call PCP if you have any questions or further requests.  Please consider signing up for MyChart if you do not already have it, as this is a great way to communicate with me.  Best,  Whitney McVey, PA-C   IF you received an x-ray today, you will receive an invoice from [facility]. Please contact [facility] at [phone] with questions or concerns regarding your invoice.   IF you received labwork today, you will receive an invoice from [facility]. Please contact [facility] at [phone] with questions or concerns regarding your invoice.   Our billing staff will not be able to assist you with questions regarding bills from these companies.  You will be contacted with the lab results as soon as they are available. The fastest way  to get your results is to activate your My Chart account. Instructions are located on the last page of this paperwork. If you have not heard from [place] regarding the results in 2 weeks, please contact this office.

## 2017-12-20 ENCOUNTER — Ambulatory Visit (INDEPENDENT_AMBULATORY_CARE_PROVIDER_SITE_OTHER): Payer: Medicare Other

## 2017-12-20 ENCOUNTER — Other Ambulatory Visit: Payer: Self-pay

## 2017-12-20 ENCOUNTER — Ambulatory Visit (INDEPENDENT_AMBULATORY_CARE_PROVIDER_SITE_OTHER): Payer: Medicare Other | Admitting: Physician Assistant

## 2017-12-20 ENCOUNTER — Other Ambulatory Visit: Payer: Self-pay | Admitting: Physician Assistant

## 2017-12-20 ENCOUNTER — Encounter: Payer: Self-pay | Admitting: Physician Assistant

## 2017-12-20 VITALS — BP 120/80 | HR 106 | Temp 99.7°F | Resp 20 | Ht 71.0 in | Wt 285.8 lb

## 2017-12-20 DIAGNOSIS — J209 Acute bronchitis, unspecified: Secondary | ICD-10-CM | POA: Diagnosis not present

## 2017-12-20 DIAGNOSIS — R6889 Other general symptoms and signs: Secondary | ICD-10-CM | POA: Diagnosis not present

## 2017-12-20 DIAGNOSIS — R059 Cough, unspecified: Secondary | ICD-10-CM

## 2017-12-20 DIAGNOSIS — F411 Generalized anxiety disorder: Secondary | ICD-10-CM

## 2017-12-20 DIAGNOSIS — R05 Cough: Secondary | ICD-10-CM | POA: Diagnosis not present

## 2017-12-20 LAB — POCT CBC
Granulocyte percent: 82.6 % — AB (ref 37–80)
HCT, POC: 43.6 % (ref 43.5–53.7)
Hemoglobin: 14.4 g/dL (ref 14.1–18.1)
Lymph, poc: 1.1 (ref 0.6–3.4)
MCH, POC: 28 pg (ref 27–31.2)
MCHC: 33 g/dL (ref 31.8–35.4)
MCV: 84.8 fL (ref 80–97)
MID (cbc): 0.6 (ref 0–0.9)
MPV: 7.2 fL (ref 0–99.8)
POC Granulocyte: 8.3 — AB (ref 2–6.9)
POC LYMPH PERCENT: 11 %L (ref 10–50)
POC MID %: 6.4 %M (ref 0–12)
Platelet Count, POC: 211 10*3/uL (ref 142–424)
RBC: 5.14 M/uL (ref 4.69–6.13)
RDW, POC: 14.2 %
WBC: 10.1 10*3/uL (ref 4.6–10.2)

## 2017-12-20 LAB — POC INFLUENZA A&B (BINAX/QUICKVUE)
Influenza A, POC: NEGATIVE
Influenza B, POC: NEGATIVE

## 2017-12-20 MED ORDER — AZITHROMYCIN 250 MG PO TABS: ORAL_TABLET | ORAL | 0 refills | Status: DC

## 2017-12-20 NOTE — Patient Instructions (Addendum)
Take the entire course of Azithromycin, even if you start to feel better sooner.  Come back if you are not improving in 2-3 days.  Stay well hydrated.   Check out gut microbiome and immunity research.   Thank you for coming in today. I hope you feel we met your needs.  Feel free to call PCP if you have any questions or further requests.  Please consider signing up for MyChart if you do not already have it, as this is a great way to communicate with me.  Best,  Whitney McVey, PA-C  IF you received an x-ray today, you will receive an invoice from [facility]. Please contact [facility] at [phone] with questions or concerns regarding your invoice.   IF you received labwork today, you will receive an invoice from [facility]. Please contact [facility] at [phone] with questions or concerns regarding your invoice.   Our billing staff will not be able to assist you with questions regarding bills from these companies.  You will be contacted with the lab results as soon as they are available. The fastest way to get your results is to activate your My Chart account. Instructions are located on the last page of this paperwork. If you have not heard from [place] regarding the results in 2 weeks, please contact this office.

## 2017-12-20 NOTE — Progress Notes (Signed)
Samuel Chambers  MRN: [id number] DOB: Jun 08, 1951  PCP: Dorise Hiss, PA-C  Subjective:  Pt is a 67 year old male who presents to clinic for cough. He is here today with his wife.  Cough is productive with brown sputum x 3 days. Endorses diarrhea, body aches and fever blisters. He has had about 10 episodes of diarrhea in the past 24 hours. No diarrhea in the past several hours. Endorses chills, fever, shob.  symptoms are worsening.  Denies blood in stool, abdominal pain, chest pain, n/v,calf pain.  He has taken Chloricedin for cough..  He was here 3 weeks ago with similar symptoms and treated with Azithromycin. He felt 100% better for the past few weeks.   Review of Systems  Constitutional: Positive for chills, diaphoresis, fatigue and fever.  HENT: Negative for congestion, postnasal drip, rhinorrhea, sinus pressure, sinus pain, sneezing and sore throat.   Respiratory: Positive for cough and shortness of breath. Negative for wheezing.   Gastrointestinal: Positive for diarrhea. Negative for abdominal pain, constipation, nausea and vomiting.    Patient Active Problem List   Diagnosis Date Noted  . Chest pain with moderate risk for cardiac etiology 11/15/2015  . Upper airway cough syndrome 06/06/2015  . Severe obesity (BMI 35.0-35.9 with comorbidity) ([place]) 08/04/2014  . Hyperlipidemia with target LDL less than 70; statin intolerant 04/29/2013  . CAD S/P percutaneous coronary angioplasty   . S/P laparoscopic cholecystectomy July 2014 02/18/2013  . Gallstones 01/28/2013  . OA (osteoarthritis) of knee 09/04/2012  . Acute medial meniscal tear 03/27/2012  . Anxiety state 02/24/2007  . Essential hypertension 02/24/2007  . GERD 02/24/2007  . DEGENERATIVE JOINT DISEASE, GENERALIZED 02/24/2007    Current Outpatient Medications on File Prior to Visit  Medication Sig Dispense Refill  . clopidogrel (PLAVIX) 75 MG tablet TAKE 1 TABLET BY MOUTH EVERY DAY 90 tablet 0  . DULoxetine  (CYMBALTA) 60 MG capsule Take 1 capsule (60 mg total) by mouth daily. 90 capsule 3  . furosemide (LASIX) 20 MG tablet TAKE 1 TABLET BY MOUTH EVERY DAY 90 tablet 0  . hydrochlorothiazide (HYDRODIURIL) 25 MG tablet Take 1 tablet (25 mg total) by mouth daily. 90 tablet 3  . losartan (COZAAR) 100 MG tablet Take 1 tablet (100 mg total) by mouth daily. Please call to make appointment for further refills 30 tablet 0  . metoprolol succinate (TOPROL-XL) 50 MG 24 hr tablet Take 1 tablet (50 mg total) by mouth daily. Take with or immediately following a meal. 90 tablet 3  . pantoprazole (PROTONIX) 40 MG tablet TAKE 1 TABLET BY MOUTH EVERY DAY 30-60 MINUTES BEFORE FIRST MEAL 90 tablet 4  . predniSONE (DELTASONE) 10 MG tablet Take 1 tablet (10 mg total) by mouth daily with breakfast. Take with 7.5mg  for total of 17.5mg  qd. 30 tablet 1  . predniSONE (DELTASONE) 10 MG tablet Take 1 tablet (10 mg total) by mouth daily with breakfast. Take with 5mg  tab for total of 15mg /day 30 tablet 0  . predniSONE (DELTASONE) 5 MG tablet Take 1 tablet (5 mg total) by mouth daily with breakfast. Take with 10mg  for total of 15mg  qd. 30 tablet 0  . benzonatate (TESSALON) 100 MG capsule Take 1-2 capsules (100-200 mg total) by mouth 3 (three) times daily as needed for cough. (Patient not taking: Reported on 12/20/2017) 40 capsule 0  . HYDROcodone-homatropine (HYCODAN) 5-1.5 MG/5ML syrup Take 5 mLs by mouth every 8 (eight) hours as needed for cough. (Patient not taking: Reported on 12/20/2017)  120 mL 0  . predniSONE (DELTASONE) 2.5 MG tablet Take 1 tablet (2.5 mg total) by mouth daily with breakfast. Take in addition to 15mg  for total of 17.5mg  qd. (Patient not taking: Reported on 12/20/2017) 30 tablet 0  . sildenafil (REVATIO) 20 MG tablet TAKE 2 TABLETS BY MOUTH AS NEEDED  11   No current facility-administered medications on file prior to visit.     Allergies  Allergen Reactions  . Gabapentin     Pt reports increased heart rate and  blood pressure  . Crestor [Rosuvastatin] Other (See Comments)    myalgias  . Oxycodone Other (See Comments)    Hallucinations-ok in small doses  . Prednisone Other (See Comments)    Increased BP  . Valsartan Other (See Comments)    Made patient feel tired and no energy  . Warfarin And Related Other (See Comments)    Fast heart beat, went down hill, felt like he was dying     Objective:  BP 120/80 (BP Location: Left Arm, Patient Position: Sitting, Cuff Size: Large)   Pulse (!) 106   Temp 99.7 F (37.6 C) (Oral)   Resp 20   Ht 5\' 11"  (1.803 m)   Wt 285 lb 12.8 oz (129.6 kg)   SpO2 98%   BMI 39.86 kg/m   Physical Exam  Constitutional: He is oriented to person, place, and time. He appears well-developed and well-nourished.  HENT:  Right Ear: Tympanic membrane normal.  Left Ear: Tympanic membrane normal.  Cardiovascular: Regular rhythm. Tachycardia present.  Pulmonary/Chest: Effort normal. No respiratory distress. He has no wheezes. He has rhonchi in the right lower field and the left lower field.  Abdominal: Soft. There is no tenderness.  Neurological: He is alert and oriented to person, place, and time.  Skin: Skin is warm and dry.  Psychiatric: He has a normal mood and affect. His behavior is normal. Judgment and thought content normal.  Vitals reviewed.   Dg Chest 2 View  Result Date: 12/20/2017 CLINICAL DATA:  Cough fever chills  shielded EXAM: CHEST - 2 VIEW COMPARISON:  07/24/2017 FINDINGS: Relatively low lung volumes.  Lungs are clear. Heart size and mediastinal contours are within normal limits. No effusion. Visualized bones unremarkable.  Cholecystectomy clips. IMPRESSION: No acute cardiopulmonary disease. Electronically Signed   By: Lucrezia Europe M.D.   On: 12/20/2017 11:58   Results for orders placed or performed in visit on 12/20/17  POC Influenza A&B (Binax test)  Result Value Ref Range   Influenza A, POC Negative Negative   Influenza B, POC Negative Negative    POCT CBC  Result Value Ref Range   WBC 10.1 4.6 - 10.2 K/uL   Lymph, poc 1.1 0.6 - 3.4   POC LYMPH PERCENT 11.0 10 - 50 %L   MID (cbc) 0.6 0 - 0.9   POC MID % 6.4 0 - 12 %M   POC Granulocyte 8.3 (A) 2 - 6.9   Granulocyte percent 82.6 (A) 37 - 80 %G   RBC 5.14 4.69 - 6.13 M/uL   Hemoglobin 14.4 14.1 - 18.1 g/dL   HCT, POC 43.6 43.5 - 53.7 %   MCV 84.8 80 - 97 fL   MCH, POC 28.0 27 - 31.2 pg   MCHC 33.0 31.8 - 35.4 g/dL   RDW, POC 14.2 %   Platelet Count, POC 211 142 - 424 K/uL   MPV 7.2 0 - 99.8 fL   Peak flow reading is 600, about 100 % of  predicted.  Assessment and Plan :  1. Acute bronchitis, unspecified organism - azithromycin (ZITHROMAX) 250 MG tablet; Take 2 tabs PO x 1 dose, then 1 tab PO QD x 4 days  Dispense: 6 tablet; Refill: 0 - Pt presents with worsening cough, chills and fever. Adventitious sounds on PE and increased granulocytes. Negative chest x-ray. Plan to cover with Azithromycin. This is Mr. Massenburg third OV for c/c of cough in the past 6 months. Plan to refer to pulmonology in the future if pt returns with similar symptoms.  2. Cough - DG Chest 2 View; Future - POCT CBC - Peak flow meter  3. Flu-like symptoms - POC Influenza A&B (Binax test) - Rapid flu is negative.   Mercer Pod, PA-C  [facility] [place] 12/20/2017 11:37 AM

## 2017-12-25 ENCOUNTER — Encounter: Payer: Self-pay | Admitting: Family Medicine

## 2017-12-25 ENCOUNTER — Ambulatory Visit (INDEPENDENT_AMBULATORY_CARE_PROVIDER_SITE_OTHER): Payer: Medicare Other | Admitting: Family Medicine

## 2017-12-25 ENCOUNTER — Ambulatory Visit (INDEPENDENT_AMBULATORY_CARE_PROVIDER_SITE_OTHER): Payer: Medicare Other

## 2017-12-25 ENCOUNTER — Other Ambulatory Visit: Payer: Self-pay

## 2017-12-25 VITALS — BP 140/78 | HR 105 | Temp 98.3°F | Ht 72.0 in | Wt 286.0 lb

## 2017-12-25 DIAGNOSIS — R05 Cough: Secondary | ICD-10-CM | POA: Diagnosis not present

## 2017-12-25 DIAGNOSIS — J22 Unspecified acute lower respiratory infection: Secondary | ICD-10-CM | POA: Diagnosis not present

## 2017-12-25 DIAGNOSIS — R059 Cough, unspecified: Secondary | ICD-10-CM

## 2017-12-25 MED ORDER — AMOXICILLIN-POT CLAVULANATE 875-125 MG PO TABS: 1.0000 | ORAL_TABLET | Freq: Two times a day (BID) | ORAL | 0 refills | Status: DC

## 2017-12-25 MED ORDER — HYDROCODONE-HOMATROPINE 5-1.5 MG/5ML PO SYRP: 5.0000 mL | ORAL_SOLUTION | Freq: Four times a day (QID) | ORAL | 0 refills | Status: DC | PRN

## 2017-12-25 NOTE — Progress Notes (Signed)
Subjective:  By signing my name below, I, [name], attest that this documentation has been prepared under the direction and in the presence of [name], MD Electronically Signed: Ladene Artist, ED Scribe 12/25/2017 at 10:20 AM.   Patient ID: Samuel Chambers, male    DOB: June 22, 1951, 67 y.o.   MRN: [id number]  Chief Complaint  Patient presents with  . Cough    ongoing for a week and sputum looked brown to dark red   HPI Samuel Chambers "[name]" is a 67 y.o. male who presents to Primary Care at [facility] complaining of cough. Pt was seen for bronchitis 5/7 and 5/25. On 5/7 cough present x 1 wk with brown sputum. He was on prednisone for polymyalgia rheumatica at 17.5 mg. Started on z-pak, hycodan, tessalon. Normal O2 stat at that time. Seen again on 5/25 for cough x 3 days with associated diarrhea, myalgias, fever blisters. Neg flu testing, WBC of 10.1, normal peak flow, neg CXR at that time. Treated again with z-pak. No h/o tobacco use. Does have a h/o CAD s/p angioplasty. Stent to L circumflex in 2008. Last seen by cardiology in 02/2017 with plan for recheck in 6 months. Appears he was evaluated by pulm Dr. Melvyn Novas in 05/2015 with upper airway cough syndrome. Treated with Protonix 40 mg qd, Mucinex, hydrocodone and Augmentin.  Today, pt reports ongoing productive cough with dark brown/red sputum. Pt states that cough seemed to improve 2 days into antibiotics but then cough worsened. He also reports associated symptoms of HA in cheeks and forehead x 3 days and postnasal drip. Pt reports that he coughs constantly throughout the night which keeps him up even with taking 5 mL hydrocodone, OTC cough syrup bid, 2 tessalon perles tid, Protonix qd. He has also tried previously prescribed albuterol inhaler multiple times without improvement. Pt finished his last dose of azithromycin yesterday. He is now taking 15 mg Prednisone for polymyalgia rheumatica. Denies fever, dyspnea other than during coughing  fits. No sick contacts.  Patient Active Problem List   Diagnosis Date Noted  . Chest pain with moderate risk for cardiac etiology 11/15/2015  . Upper airway cough syndrome 06/06/2015  . Severe obesity (BMI 35.0-35.9 with comorbidity) ([place]) 08/04/2014  . Hyperlipidemia with target LDL less than 70; statin intolerant 04/29/2013  . CAD S/P percutaneous coronary angioplasty   . S/P laparoscopic cholecystectomy July 2014 02/18/2013  . Gallstones 01/28/2013  . OA (osteoarthritis) of knee 09/04/2012  . Acute medial meniscal tear 03/27/2012  . Anxiety state 02/24/2007  . Essential hypertension 02/24/2007  . GERD 02/24/2007  . DEGENERATIVE JOINT DISEASE, GENERALIZED 02/24/2007   Past Medical History:  Diagnosis Date  . Adenomatous colon polyp 03/2005  . Anxiety   . CAD S/P percutaneous coronary angioplasty 2008   PCI to circumflex with a Promus DES 2.5 mm x 8 mm  . Degenerative joint disease   . Diverticulosis   . Fibromyalgia   . GERD (gastroesophageal reflux disease)   . Hyperlipidemia    statin intolerant  . Hypertension   . Internal hemorrhoids   . Obesity, Class II, BMI 35-39.9   . PONV (postoperative nausea and vomiting)    Past Surgical History:  Procedure Laterality Date  . CHOLECYSTECTOMY N/A 02/18/2013   Procedure: LAPAROSCOPIC CHOLECYSTECTOMY WITH INTRAOPERATIVE CHOLANGIOGRAM;  Surgeon: Pedro Earls, MD;  Location: [facility];  Service: General;  Laterality: N/A;  . COLONOSCOPY    . CORONARY ANGIOPLASTY WITH STENT PLACEMENT  04/30/2007   2.5  mm Promus stent that was to 95% Circ;had 60-70% lesion to RCA  . DOPPLER ECHOCARDIOGRAPHY  05/27/2002   [facility]normal EF 55-66%,  . HEEL SPUR SURGERY    . HEMORRHOID SURGERY    . hip surgery     complete  . Holter Monitor  04/07/2007   sinus tachy.;  . KNEE ARTHROSCOPY  03/27/2012   Procedure: ARTHROSCOPY KNEE;  Surgeon: Gearlean Alf, MD;  Location: [facility];  Service: Orthopedics;  Laterality: Left;  . NM MYOCAR PERF WALL  MOTION  12/19/2011   EXERCISED FOR 8-1/2 MINUTES RECHING 10 METABOLIC EQUIVALENTS. NO EVIDENCE  OF ISCHEMIA OR INFARCTION . EF 71%  . renal dopplers  04/08/2007   relatively normal  . TOTAL KNEE ARTHROPLASTY Left 09/04/2012   Procedure: TOTAL KNEE ARTHROPLASTY;  Surgeon: Gearlean Alf, MD;  Location: [facility];  Service: Orthopedics;  Laterality: Left;   Allergies  Allergen Reactions  . Gabapentin     Pt reports increased heart rate and blood pressure  . Crestor [Rosuvastatin] Other (See Comments)    myalgias  . Oxycodone Other (See Comments)    Hallucinations-ok in small doses  . Prednisone Other (See Comments)    Increased BP and HR  . Valsartan Other (See Comments)    Made patient feel tired and no energy  . Warfarin And Related Other (See Comments)    Fast heart beat, went down hill, felt like he was dying   Prior to Admission medications   Medication Sig Start Date End Date Taking? Authorizing Provider  azithromycin (ZITHROMAX) 250 MG tablet Take 2 tabs PO x 1 dose, then 1 tab PO QD x 4 days 12/20/17   McVey, Gelene Mink, PA-C  benzonatate (TESSALON) 100 MG capsule Take 1-2 capsules (100-200 mg total) by mouth 3 (three) times daily as needed for cough. Patient not taking: Reported on 12/20/2017 12/02/17   McVey, Gelene Mink, PA-C  clopidogrel (PLAVIX) 75 MG tablet TAKE 1 TABLET BY MOUTH EVERY DAY 08/08/17   Leonie Man, MD  DULoxetine (CYMBALTA) 60 MG capsule Take 1 capsule (60 mg total) by mouth daily. 10/14/17   McVey, Gelene Mink, PA-C  furosemide (LASIX) 20 MG tablet TAKE 1 TABLET BY MOUTH EVERY DAY 10/24/17   Croitoru, Mihai, MD  hydrochlorothiazide (HYDRODIURIL) 25 MG tablet Take 1 tablet (25 mg total) by mouth daily. 03/14/17   Croitoru, Mihai, MD  HYDROcodone-homatropine (HYCODAN) 5-1.5 MG/5ML syrup Take 5 mLs by mouth every 8 (eight) hours as needed for cough. Patient not taking: Reported on 12/20/2017 12/02/17   McVey, Gelene Mink, PA-C  losartan (COZAAR)  100 MG tablet Take 1 tablet (100 mg total) by mouth daily. Please call to make appointment for further refills 11/24/17   Croitoru, Dani Gobble, MD  metoprolol succinate (TOPROL-XL) 50 MG 24 hr tablet Take 1 tablet (50 mg total) by mouth daily. Take with or immediately following a meal. 03/14/17   Croitoru, Mihai, MD  pantoprazole (PROTONIX) 40 MG tablet TAKE 1 TABLET BY MOUTH EVERY DAY 30-60 MINUTES BEFORE FIRST MEAL 10/14/17   McVey, Gelene Mink, PA-C  predniSONE (DELTASONE) 10 MG tablet Take 1 tablet (10 mg total) by mouth daily with breakfast. Take with 7.5mg  for total of 17.5mg  qd. 11/07/17   McVey, Gelene Mink, PA-C  predniSONE (DELTASONE) 10 MG tablet Take 1 tablet (10 mg total) by mouth daily with breakfast. Take with 5mg  tab for total of 15mg /day 12/02/17   McVey, Gelene Mink, PA-C  predniSONE (DELTASONE) 2.5 MG tablet Take 1  tablet (2.5 mg total) by mouth daily with breakfast. Take in addition to 15mg  for total of 17.5mg  qd. Patient not taking: Reported on 12/20/2017 11/07/17   McVey, Gelene Mink, PA-C  predniSONE (DELTASONE) 5 MG tablet Take 1 tablet (5 mg total) by mouth daily with breakfast. Take with 10mg  for total of 15mg  qd. 12/02/17   McVey, Gelene Mink, PA-C  sildenafil (REVATIO) 20 MG tablet TAKE 2 TABLETS BY MOUTH AS NEEDED 11/21/17   [provider]   Social History   Socioeconomic History  . Marital status: Married    Spouse name: Not on file  . Number of children: 1  . Years of education: Not on file  . Highest education level: Not on file  Occupational History    Employer: [employer]  . Financial resource strain: Not hard at all  . Food insecurity:    Worry: Never true    Inability: Never true  . Transportation needs:    Medical: No    Non-medical: No  Tobacco Use  . Smoking status: Never Smoker  . Smokeless tobacco: Never Used  Substance and Sexual Activity  . Alcohol use: No  . Drug use: No  . Sexual activity: Not on  file  Lifestyle  . Physical activity:    Days per week: 0 days    Minutes per session: 0 min  . Stress: To some extent  Relationships  . Social connections:    Talks on phone: More than three times a week    Gets together: More than three times a week    Attends religious service: More than 4 times per year    Active member of club or organization: No    Attends meetings of clubs or organizations: Never    Relationship status: Married  . Intimate partner violence:    Fear of current or ex partner: No    Emotionally abused: No    Physically abused: No    Forced sexual activity: No  Other Topics Concern  . Not on file  Social History Narrative   Very little 0-2 drinks a week   Review of Systems  Constitutional: Negative for fever.  HENT: Positive for postnasal drip.   Respiratory: Positive for cough. Negative for shortness of breath.   Neurological: Positive for headaches.      Objective:   Physical Exam  Constitutional: He is oriented to person, place, and time. He appears well-developed and well-nourished.  HENT:  Head: Normocephalic and atraumatic.  Right Ear: Tympanic membrane, external ear and ear canal normal.  Left Ear: Tympanic membrane, external ear and ear canal normal.  Nose: No rhinorrhea. Right sinus exhibits no maxillary sinus tenderness and no frontal sinus tenderness. Left sinus exhibits no maxillary sinus tenderness and no frontal sinus tenderness.  Mouth/Throat: Oropharynx is clear and moist and mucous membranes are normal. No oropharyngeal exudate or posterior oropharyngeal erythema.  No appreciable sinus tenderness.  Eyes: Pupils are equal, round, and reactive to light. Conjunctivae are normal.  Neck: Neck supple.  Cardiovascular: Normal rate, regular rhythm, normal heart sounds and intact distal pulses.  No murmur heard. Pulmonary/Chest: Effort normal and breath sounds normal. He has no wheezes. He has no rhonchi. He has no rales.  Faint end expiratory  coarse sound in L upper.  Abdominal: Soft. There is no tenderness.  Lymphadenopathy:    He has no cervical adenopathy.  Neurological: He is alert and oriented to person, place, and time.  Skin: Skin is  warm and dry. No rash noted.  Psychiatric: He has a normal mood and affect. His behavior is normal.  Vitals reviewed.  Vitals:   12/25/17 0942  BP: 140/78  Pulse: (!) 105  Temp: 98.3 F (36.8 C)  TempSrc: Oral  SpO2: 96%  Weight: 286 lb (129.7 kg)  Height: 6' (1.829 m)   Dg Chest 2 View  Result Date: 12/25/2017 CLINICAL DATA:  Worsening cough over the last 3 days EXAM: CHEST - 2 VIEW COMPARISON:  Chest x-ray of 12/20/2017 and 07/24/2017 FINDINGS: No active infiltrate or effusion is seen. Mediastinal and hilar contours are unremarkable. There is mild peribronchial thickening which can be seen with bronchitis. The heart is within upper normal limits in size. No bony abnormality is seen. IMPRESSION: No active cardiopulmonary disease.  Cannot exclude bronchitis. Electronically Signed   By: Ivar Drape M.D.   On: 12/25/2017 10:51      Assessment & Plan:    AADON GORELIK is a 67 y.o. male Cough - Plan: DG Chest 2 View, HYDROcodone-homatropine (HYCODAN) 5-1.5 MG/5ML syrup  LRTI (lower respiratory tract infection)  Recurrent cough.  Was having slight improvement with azithromycin and then clinical worsening past few days.  Bronchitis noted on chest x-ray but no specific infiltrate.  Does have some postnasal drip/sinus pressure/headache.  May have secondary sinusitis.  Doubt reactive airway as he did not experience improvement with albuterol.  No tobacco history/exposure, less likely chronic bronchitis/COPD.  Upper airway cough syndrome in the differential but had been improved prior to recent illness and is continuing on his proton pump inhibitor.  Unlikely cardiac cause as no edema seen on chest x-ray and appears euvolemic on exam.   - Treatment options discussed, will try Augmentin for  possible secondary sinusitis/worsening bronchitis.  -Hycodan cough syrup refilled, can use that throughout the day for the next day or 2 in case there is some cyclical cough syndrome.  Continue Tessalon, other symptomatic care.  RTC precautions discussed if persistent symptoms or worsening.  Would also consider follow-up with pulmonary if cough persists. Meds ordered this encounter  Medications  . amoxicillin-clavulanate (AUGMENTIN) 875-125 MG tablet    Sig: Take 1 tablet by mouth 2 (two) times daily.    Dispense:  20 tablet    Refill:  0  . HYDROcodone-homatropine (HYCODAN) 5-1.5 MG/5ML syrup    Sig: Take 5 mLs by mouth every 6 (six) hours as needed for cough.    Dispense:  120 mL    Refill:  0   Patient Instructions   X-ray appears overall okay today, indicating bronchitis possibly.  There was no sign of pneumonia.  Due to worsening symptoms past few days, we can try different antibiotic, but watch for diarrhea or other new side effects as the azithromycin is still in your system.  Continue Mucinex or Mucinex DM during the day, Tessalon Perles if needed, but you may need to temporarily use the hydrocodone cough syrup every 6 hours during the day for the next day or two to help control the cough.  If you are not improving in the next 4 to 5 days, or any worsening sooner, please return for recheck.  Thank you for coming in today.   Acute Bronchitis, Adult Acute bronchitis is sudden (acute) swelling of the air tubes (bronchi) in the lungs. Acute bronchitis causes these tubes to fill with mucus, which can make it hard to breathe. It can also cause coughing or wheezing. In adults, acute bronchitis usually goes away within  2 weeks. A cough caused by bronchitis may last up to 3 weeks. Smoking, allergies, and asthma can make the condition worse. Repeated episodes of bronchitis may cause further lung problems, such as chronic obstructive pulmonary disease (COPD). What are the causes? This condition  can be caused by germs and by substances that irritate the lungs, including:  Cold and flu viruses. This condition is most often caused by the same virus that causes a cold.  Bacteria.  Exposure to tobacco smoke, dust, fumes, and air pollution.  What increases the risk? This condition is more likely to develop in people who:  Have close contact with someone with acute bronchitis.  Are exposed to lung irritants, such as tobacco smoke, dust, fumes, and vapors.  Have a weak immune system.  Have a respiratory condition such as asthma.  What are the signs or symptoms? Symptoms of this condition include:  A cough.  Coughing up clear, yellow, or green mucus.  Wheezing.  Chest congestion.  Shortness of breath.  A fever.  Body aches.  Chills.  A sore throat.  How is this diagnosed? This condition is usually diagnosed with a physical exam. During the exam, your health care provider may order tests, such as chest X-rays, to rule out other conditions. He or she may also:  Test a sample of your mucus for bacterial infection.  Check the level of oxygen in your blood. This is done to check for pneumonia.  Do a chest X-ray or lung function testing to rule out pneumonia and other conditions.  Perform blood tests.  Your health care provider will also ask about your symptoms and medical history. How is this treated? Most cases of acute bronchitis clear up over time without treatment. Your health care provider may recommend:  Drinking more fluids. Drinking more makes your mucus thinner, which may make it easier to breathe.  Taking a medicine for a fever or cough.  Taking an antibiotic medicine.  Using an inhaler to help improve shortness of breath and to control a cough.  Using a cool mist vaporizer or humidifier to make it easier to breathe.  Follow these instructions at home: Medicines  Take over-the-counter and prescription medicines only as told by your health care  provider.  If you were prescribed an antibiotic, take it as told by your health care provider. Do not stop taking the antibiotic even if you start to feel better. General instructions  Get plenty of rest.  Drink enough fluids to keep your urine clear or pale yellow.  Avoid smoking and secondhand smoke. Exposure to cigarette smoke or irritating chemicals will make bronchitis worse. If you smoke and you need help quitting, ask your health care provider. Quitting smoking will help your lungs heal faster.  Use an inhaler, cool mist vaporizer, or humidifier as told by your health care provider.  Keep all follow-up visits as told by your health care provider. This is important. How is this prevented? To lower your risk of getting this condition again:  Wash your hands often with soap and water. If soap and water are not available, use hand sanitizer.  Avoid contact with people who have cold symptoms.  Try not to touch your hands to your mouth, nose, or eyes.  Make sure to get the flu shot every year.  Contact a health care provider if:  Your symptoms do not improve in 2 weeks of treatment. Get help right away if:  You cough up blood.  You have chest pain.  You have severe shortness of breath.  You become dehydrated.  You faint or keep feeling like you are going to faint.  You keep vomiting.  You have a severe headache.  Your fever or chills gets worse. This information is not intended to replace advice given to you by your health care provider. Make sure you discuss any questions you have with your health care provider. Document Released: 08/22/2004 Document Revised: 02/07/2016 Document Reviewed: 01/03/2016 Elsevier Interactive Patient Education  2018 [employer].   IF you received an x-ray today, you will receive an invoice from [facility]. Please contact [facility] at [phone] with questions or concerns regarding your invoice.   IF you  received labwork today, you will receive an invoice from [place]. Please contact [facility] at [phone] with questions or concerns regarding your invoice.   Our billing staff will not be able to assist you with questions regarding bills from these companies.  You will be contacted with the lab results as soon as they are available. The fastest way to get your results is to activate your My Chart account. Instructions are located on the last page of this paperwork. If you have not heard from [place] regarding the results in 2 weeks, please contact this office.       I personally performed the services described in this documentation, which was scribed in my presence. The recorded information has been reviewed and considered for accuracy and completeness, addended by me as needed, and agree with information above.  Signed,   Merri Ray, MD [facility].  12/25/17 3:12 PM

## 2017-12-25 NOTE — Patient Instructions (Addendum)
X-ray appears overall okay today, indicating bronchitis possibly.  There was no sign of pneumonia.  Due to worsening symptoms past few days, we can try different antibiotic, but watch for diarrhea or other new side effects as the azithromycin is still in your system.  Continue Mucinex or Mucinex DM during the day, Tessalon Perles if needed, but you may need to temporarily use the hydrocodone cough syrup every 6 hours during the day for the next day or two to help control the cough.  If you are not improving in the next 4 to 5 days, or any worsening sooner, please return for recheck.  Thank you for coming in today.   Acute Bronchitis, Adult Acute bronchitis is sudden (acute) swelling of the air tubes (bronchi) in the lungs. Acute bronchitis causes these tubes to fill with mucus, which can make it hard to breathe. It can also cause coughing or wheezing. In adults, acute bronchitis usually goes away within 2 weeks. A cough caused by bronchitis may last up to 3 weeks. Smoking, allergies, and asthma can make the condition worse. Repeated episodes of bronchitis may cause further lung problems, such as chronic obstructive pulmonary disease (COPD). What are the causes? This condition can be caused by germs and by substances that irritate the lungs, including:  Cold and flu viruses. This condition is most often caused by the same virus that causes a cold.  Bacteria.  Exposure to tobacco smoke, dust, fumes, and air pollution.  What increases the risk? This condition is more likely to develop in people who:  Have close contact with someone with acute bronchitis.  Are exposed to lung irritants, such as tobacco smoke, dust, fumes, and vapors.  Have a weak immune system.  Have a respiratory condition such as asthma.  What are the signs or symptoms? Symptoms of this condition include:  A cough.  Coughing up clear, yellow, or green mucus.  Wheezing.  Chest congestion.  Shortness of breath.  A  fever.  Body aches.  Chills.  A sore throat.  How is this diagnosed? This condition is usually diagnosed with a physical exam. During the exam, your health care provider may order tests, such as chest X-rays, to rule out other conditions. He or she may also:  Test a sample of your mucus for bacterial infection.  Check the level of oxygen in your blood. This is done to check for pneumonia.  Do a chest X-ray or lung function testing to rule out pneumonia and other conditions.  Perform blood tests.  Your health care provider will also ask about your symptoms and medical history. How is this treated? Most cases of acute bronchitis clear up over time without treatment. Your health care provider may recommend:  Drinking more fluids. Drinking more makes your mucus thinner, which may make it easier to breathe.  Taking a medicine for a fever or cough.  Taking an antibiotic medicine.  Using an inhaler to help improve shortness of breath and to control a cough.  Using a cool mist vaporizer or humidifier to make it easier to breathe.  Follow these instructions at home: Medicines  Take over-the-counter and prescription medicines only as told by your health care provider.  If you were prescribed an antibiotic, take it as told by your health care provider. Do not stop taking the antibiotic even if you start to feel better. General instructions  Get plenty of rest.  Drink enough fluids to keep your urine clear or pale yellow.  Avoid smoking  and secondhand smoke. Exposure to cigarette smoke or irritating chemicals will make bronchitis worse. If you smoke and you need help quitting, ask your health care provider. Quitting smoking will help your lungs heal faster.  Use an inhaler, cool mist vaporizer, or humidifier as told by your health care provider.  Keep all follow-up visits as told by your health care provider. This is important. How is this prevented? To lower your risk of  getting this condition again:  Wash your hands often with soap and water. If soap and water are not available, use hand sanitizer.  Avoid contact with people who have cold symptoms.  Try not to touch your hands to your mouth, nose, or eyes.  Make sure to get the flu shot every year.  Contact a health care provider if:  Your symptoms do not improve in 2 weeks of treatment. Get help right away if:  You cough up blood.  You have chest pain.  You have severe shortness of breath.  You become dehydrated.  You faint or keep feeling like you are going to faint.  You keep vomiting.  You have a severe headache.  Your fever or chills gets worse. This information is not intended to replace advice given to you by your health care provider. Make sure you discuss any questions you have with your health care provider. Document Released: 08/22/2004 Document Revised: 02/07/2016 Document Reviewed: 01/03/2016 Elsevier Interactive Patient Education  2018 [employer].   IF you received an x-ray today, you will receive an invoice from [facility]. Please contact [facility] at [phone] with questions or concerns regarding your invoice.   IF you received labwork today, you will receive an invoice from [place]. Please contact [facility] at [phone] with questions or concerns regarding your invoice.   Our billing staff will not be able to assist you with questions regarding bills from these companies.  You will be contacted with the lab results as soon as they are available. The fastest way to get your results is to activate your My Chart account. Instructions are located on the last page of this paperwork. If you have not heard from [place] regarding the results in 2 weeks, please contact this office.

## 2017-12-31 ENCOUNTER — Encounter: Payer: Self-pay | Admitting: Physician Assistant

## 2017-12-31 ENCOUNTER — Other Ambulatory Visit: Payer: Self-pay | Admitting: Physician Assistant

## 2017-12-31 DIAGNOSIS — M25562 Pain in left knee: Principal | ICD-10-CM

## 2017-12-31 DIAGNOSIS — M25561 Pain in right knee: Secondary | ICD-10-CM

## 2017-12-31 MED ORDER — PREDNISONE 2.5 MG PO TABS: 2.5000 mg | ORAL_TABLET | Freq: Every day | ORAL | 0 refills | Status: DC

## 2017-12-31 MED ORDER — PREDNISONE 10 MG PO TABS: 10.0000 mg | ORAL_TABLET | Freq: Every day | ORAL | 0 refills | Status: DC

## 2018-01-19 ENCOUNTER — Other Ambulatory Visit: Payer: Self-pay | Admitting: Cardiovascular Disease

## 2018-01-19 ENCOUNTER — Telehealth: Payer: Self-pay | Admitting: Cardiovascular Disease

## 2018-01-19 NOTE — Telephone Encounter (Signed)
Rx request sent to pharmacy.  

## 2018-01-19 NOTE — Telephone Encounter (Signed)
   Primary Cardiologist:Mihai Croitoru, MD   Left message for patient to call back.  APP needs to review with patient any clinical change since last seen.  Also, message sent to Dr. Sallyanne Kuster for input regarding holding Plavix.  Chart reviewed as part of pre-operative protocol coverage.  Patient has a hx of PCI with DES to the LCx in 2008.  Nuclear stress test in 2013 was low risk.  He was last seen in 02/2017.  Request from urologist is to hold Plavix for prostate Bx.  Suspect it will be ok to hold Plavix.  But, patient needs to be contacted first to make sure he has had no clinical change in symptoms since last seen.  I will also forward to Dr. Sallyanne Kuster to get his input regarding holding Plavix (he does not take ASA).    Richardson Dopp, PA-C  01/19/2018, 4:46 PM

## 2018-01-19 NOTE — Telephone Encounter (Signed)
   Primary Cardiologist:  Sanda Klein, MD   Call back staff:  Letter faxed to Dr. Diona Fanti.  Please call office to make sure received.  Note to be removed from preop pool.  Spoke with patient by phone.  He is doing well without any chest pain or shortness of breath since last seen.   RCRI:  0.9%. He is at acceptable risk for prostate biopsy.  He can hold his Plavix for 5-7 days prior to his procedure and resume post op when safe. Richardson Dopp, PA-C    01/19/2018 5:02 PM

## 2018-01-19 NOTE — Telephone Encounter (Signed)
New Message        [facility] Pre-operative Risk Assessment    Request for surgical clearance:  1. What type of surgery is being performed? Inoffice prostate ultrasound and biopsy    2. When is this surgery scheduled? 01/26/2018    3. What type of clearance is required (medical clearance vs. Pharmacy clearance to hold med vs. Both)? Pharmacy   4. Are there any medications that need to be held prior to surgery and how long? Hold for Plavix for 7 days prior to procedure which would need to be stopped today   5. Practice name and name of physician performing surgery? [facility] Dr. Diona Fanti  6. What is your office phone number [phone]    7.   What is your office fax number [phone]   8.   Anesthesia type (None, local, MAC, general) ? Local    Avaletta L Williams 01/19/2018, 11:21 AM  _________________________________________________________________   (provider comments below)

## 2018-01-20 NOTE — Telephone Encounter (Signed)
[facility] to see if the clearance was received, left a detailed message on a voicemail that if they haven't received the cardiac clearance, to call the office back.

## 2018-01-26 DIAGNOSIS — R972 Elevated prostate specific antigen [PSA]: Secondary | ICD-10-CM | POA: Diagnosis not present

## 2018-01-26 DIAGNOSIS — D075 Carcinoma in situ of prostate: Secondary | ICD-10-CM | POA: Diagnosis not present

## 2018-02-03 ENCOUNTER — Encounter: Payer: Self-pay | Admitting: Physician Assistant

## 2018-02-04 ENCOUNTER — Encounter: Payer: Self-pay | Admitting: Physician Assistant

## 2018-02-04 DIAGNOSIS — M1711 Unilateral primary osteoarthritis, right knee: Secondary | ICD-10-CM | POA: Diagnosis not present

## 2018-02-06 ENCOUNTER — Encounter: Payer: Self-pay | Admitting: Physician Assistant

## 2018-02-09 ENCOUNTER — Encounter: Payer: Self-pay | Admitting: Physician Assistant

## 2018-02-09 NOTE — Telephone Encounter (Signed)
Patient called and states that he sent multiple Mychart messages for a medication request predniSONE (DELTASONE) 10 MG tablet  Thank you

## 2018-02-10 ENCOUNTER — Other Ambulatory Visit: Payer: Self-pay | Admitting: Physician Assistant

## 2018-02-10 ENCOUNTER — Encounter: Payer: Self-pay | Admitting: Physician Assistant

## 2018-02-10 DIAGNOSIS — M25561 Pain in right knee: Secondary | ICD-10-CM

## 2018-02-10 DIAGNOSIS — M25562 Pain in left knee: Principal | ICD-10-CM

## 2018-02-10 MED ORDER — PREDNISONE 10 MG PO TABS: 10.0000 mg | ORAL_TABLET | Freq: Every day | ORAL | 0 refills | Status: DC

## 2018-02-19 ENCOUNTER — Encounter: Payer: Self-pay | Admitting: Physician Assistant

## 2018-02-19 DIAGNOSIS — M1611 Unilateral primary osteoarthritis, right hip: Secondary | ICD-10-CM | POA: Diagnosis not present

## 2018-02-23 ENCOUNTER — Encounter: Payer: Self-pay | Admitting: *Deleted

## 2018-02-23 ENCOUNTER — Encounter: Payer: Self-pay | Admitting: Physician Assistant

## 2018-02-23 NOTE — Telephone Encounter (Signed)
This encounter was created in error - please disregard.

## 2018-02-26 ENCOUNTER — Telehealth: Payer: Self-pay | Admitting: Cardiovascular Disease

## 2018-02-26 NOTE — Telephone Encounter (Signed)
Pt called to check into his surgical clearance; pt surgery is 8.19.19; contact pt

## 2018-02-26 NOTE — Telephone Encounter (Signed)
Spoke with pt's wife who states pt has a 6 mo f/u appointment scheduled for 8/15 with Fabian Sharp, PA but pt also is scheduled for hip surgery on 8/19. Wife is requesting if pt's appointment can be moved up as it wouldn't be enough time from clearance. Appointment moved to  8/2 with Billey Chang, [place].

## 2018-02-26 NOTE — Telephone Encounter (Signed)
New Message    Pt's wife is calling wanting the nurse to call her as soon as possible but doesn't want to disclose why. Please call

## 2018-02-27 ENCOUNTER — Encounter: Payer: Self-pay | Admitting: Physician Assistant

## 2018-02-27 ENCOUNTER — Ambulatory Visit (INDEPENDENT_AMBULATORY_CARE_PROVIDER_SITE_OTHER): Payer: Medicare Other | Admitting: Physician Assistant

## 2018-02-27 ENCOUNTER — Telehealth: Payer: Self-pay | Admitting: Physician Assistant

## 2018-02-27 VITALS — BP 138/86 | HR 102 | Ht 72.0 in | Wt 287.0 lb

## 2018-02-27 DIAGNOSIS — I10 Essential (primary) hypertension: Secondary | ICD-10-CM

## 2018-02-27 DIAGNOSIS — E785 Hyperlipidemia, unspecified: Secondary | ICD-10-CM

## 2018-02-27 DIAGNOSIS — I2510 Atherosclerotic heart disease of native coronary artery without angina pectoris: Secondary | ICD-10-CM | POA: Diagnosis not present

## 2018-02-27 DIAGNOSIS — Z9861 Coronary angioplasty status: Secondary | ICD-10-CM | POA: Diagnosis not present

## 2018-02-27 DIAGNOSIS — Z01810 Encounter for preprocedural cardiovascular examination: Secondary | ICD-10-CM | POA: Diagnosis not present

## 2018-02-27 NOTE — Telephone Encounter (Unsigned)
Copied from [place] [id number]. Topic: Quick Communication - See Telephone Encounter >> Feb 27, 2018  2:49 PM Percell Belt A wrote: CRM for notification. See Telephone encounter for: 02/27/18. Pt wife called in and stated that [facility] should have faxed over a Surgical Clearance form?  Has the office rec'd this form?    [facility] - Hip replacement  Pt is needing a surgical clearance as soon has possible.  Appt is 8/19 Phone number is [phone]

## 2018-02-27 NOTE — Progress Notes (Signed)
Cardiology Office Note    Date:  03/01/2018   ID:  Samuel Chambers, DOB 11-14-1950, MRN [id number]  PCP:  Dorise Hiss, PA-C  Cardiologist:  Dr. Kern Reap  Chief Complaint  Patient presents with  . Pre-op Exam    6 months. Upcoming hip surgery by Dr. Wynelle Link    History of Present Illness:  Samuel Chambers is a 67 y.o. male with PMH of CAD s/p DES to LCx in 2008, HTN, HLD in tolerant statins, and obesity.  Myoview obtained in 2013 was low risk.  According to the previous office note, he has tried Lipitor, simvastatin and Crestor in the past as well as Zetia, all of which cause muscle aches.  Patient was cleared in June of this year for urology procedure.  Patient presents today for preoperative clearance.  He has upcoming right total hip by Dr. Wynelle Link.  Since his last visit, he has been doing well, denies any chest pain or shortness of breath..  He has been able to climb up 2 flight of stairs or walk 2-4 blocks away from his house and back without any issue.  His hip problem does prevent him from overexerting himself and is a constant source of pain.  He has no lower extremity edema, orthopnea or PND.  He is cleared to proceed with surgery.  I instructed him to hold Plavix for 7 days prior to the surgery and restart after the surgery as soon as possible at the discretion of surgeon.   Past Medical History:  Diagnosis Date  . Adenomatous colon polyp 03/2005  . Anxiety   . CAD S/P percutaneous coronary angioplasty 2008   PCI to circumflex with a Promus DES 2.5 mm x 8 mm  . Degenerative joint disease   . Diverticulosis   . Fibromyalgia   . GERD (gastroesophageal reflux disease)   . Hyperlipidemia    statin intolerant  . Hypertension   . Internal hemorrhoids   . Obesity, Class II, BMI 35-39.9   . PONV (postoperative nausea and vomiting)     Past Surgical History:  Procedure Laterality Date  . CHOLECYSTECTOMY N/A 02/18/2013   Procedure: LAPAROSCOPIC  CHOLECYSTECTOMY WITH INTRAOPERATIVE CHOLANGIOGRAM;  Surgeon: Pedro Earls, MD;  Location: [facility];  Service: General;  Laterality: N/A;  . COLONOSCOPY    . CORONARY ANGIOPLASTY WITH STENT PLACEMENT  04/30/2007   2.5 mm Promus stent that was to 95% Circ;had 60-70% lesion to RCA  . DOPPLER ECHOCARDIOGRAPHY  05/27/2002   [facility].-normal EF 55-66%,  . HEEL SPUR SURGERY    . HEMORRHOID SURGERY    . hip surgery     complete  . Holter Monitor  04/07/2007   sinus tachy.;  . KNEE ARTHROSCOPY  03/27/2012   Procedure: ARTHROSCOPY KNEE;  Surgeon: Gearlean Alf, MD;  Location: [facility];  Service: Orthopedics;  Laterality: Left;  . NM MYOCAR PERF WALL MOTION  12/19/2011   EXERCISED FOR 8-1/2 MINUTES RECHING 10 METABOLIC EQUIVALENTS. NO EVIDENCE  OF ISCHEMIA OR INFARCTION . EF 71%  . renal dopplers  04/08/2007   relatively normal  . TOTAL KNEE ARTHROPLASTY Left 09/04/2012   Procedure: TOTAL KNEE ARTHROPLASTY;  Surgeon: Gearlean Alf, MD;  Location: [facility];  Service: Orthopedics;  Laterality: Left;    Current Medications: Outpatient Medications Prior to Visit  Medication Sig Dispense Refill  . clopidogrel (PLAVIX) 75 MG tablet TAKE 1 TABLET BY MOUTH EVERY DAY 90 tablet 0  . DULoxetine (CYMBALTA) 60 MG capsule  Take 1 capsule (60 mg total) by mouth daily. 90 capsule 3  . furosemide (LASIX) 20 MG tablet Take 1 tablet (20 mg total) by mouth daily. Please schedule appointment for refills. 90 tablet 0  . hydrochlorothiazide (HYDRODIURIL) 25 MG tablet Take 1 tablet (25 mg total) by mouth daily. 90 tablet 3  . losartan (COZAAR) 100 MG tablet Take 1 tablet (100 mg total) by mouth daily. Please call to make appointment for further refills 30 tablet 0  . metoprolol succinate (TOPROL-XL) 50 MG 24 hr tablet Take 1 tablet (50 mg total) by mouth daily. Take with or immediately following a meal. 90 tablet 3  . pantoprazole (PROTONIX) 40 MG tablet TAKE 1 TABLET BY MOUTH EVERY DAY 30-60 MINUTES BEFORE FIRST MEAL 90  tablet 4  . sildenafil (REVATIO) 20 MG tablet TAKE 2 TABLETS BY MOUTH AS NEEDED  11  . benzonatate (TESSALON) 100 MG capsule Take 1-2 capsules (100-200 mg total) by mouth 3 (three) times daily as needed for cough. 40 capsule 0  . HYDROcodone-homatropine (HYCODAN) 5-1.5 MG/5ML syrup Take 5 mLs by mouth every 6 (six) hours as needed for cough. 120 mL 0  . predniSONE (DELTASONE) 10 MG tablet Take 1 tablet (10 mg total) by mouth daily with breakfast. 30 tablet 0  . predniSONE (DELTASONE) 2.5 MG tablet Take 1 tablet (2.5 mg total) by mouth daily with breakfast. Take with 10mg  for total of 12.5mg  qd. 30 tablet 0   No facility-administered medications prior to visit.      Allergies:   Gabapentin; Crestor [rosuvastatin]; Oxycodone; Prednisone; Valsartan; and Warfarin and related   Social History   Socioeconomic History  . Marital status: Married    Spouse name: Not on file  . Number of children: 1  . Years of education: Not on file  . Highest education level: Not on file  Occupational History    Employer: [employer]  . Financial resource strain: Not hard at all  . Food insecurity:    Worry: Never true    Inability: Never true  . Transportation needs:    Medical: No    Non-medical: No  Tobacco Use  . Smoking status: Never Smoker  . Smokeless tobacco: Never Used  Substance and Sexual Activity  . Alcohol use: No  . Drug use: No  . Sexual activity: Not on file  Lifestyle  . Physical activity:    Days per week: 0 days    Minutes per session: 0 min  . Stress: To some extent  Relationships  . Social connections:    Talks on phone: More than three times a week    Gets together: More than three times a week    Attends religious service: More than 4 times per year    Active member of club or organization: No    Attends meetings of clubs or organizations: Never    Relationship status: Married  Other Topics Concern  . Not on file  Social History Narrative   Very  little 0-2 drinks a week     Family History:  The patient's family history includes Breast cancer in his mother; Diabetes in his mother; Prostate cancer in his father.   ROS:   Please see the history of present illness.    ROS All other systems reviewed and are negative.   PHYSICAL EXAM:   VS:  BP 138/86 (BP Location: Left Arm, Patient Position: Sitting, Cuff Size: Normal)   Pulse (!) 102   Ht  6' (1.829 m)   Wt 287 lb (130.2 kg)   BMI 38.92 kg/m    GEN: Well nourished, well developed, in no acute distress  HEENT: normal  Neck: no JVD, carotid bruits, or masses Cardiac: RRR; no murmurs, rubs, or gallops,no edema  Respiratory:  clear to auscultation bilaterally, normal work of breathing GI: soft, nontender, nondistended, + BS MS: no deformity or atrophy  Skin: warm and dry, no rash Neuro:  Alert and Oriented x 3, Strength and sensation are intact Psych: euthymic mood, full affect  Wt Readings from Last 3 Encounters:  02/27/18 287 lb (130.2 kg)  12/25/17 286 lb (129.7 kg)  12/20/17 285 lb 12.8 oz (129.6 kg)      Studies/Labs Reviewed:   EKG:  EKG is ordered today.  The ekg ordered today demonstrates mild sinus tachycardia, no significant ST-T wave changes  Recent Labs: 10/14/2017: TSH 3.470 12/20/2017: Hemoglobin 14.4   Lipid Panel    Component Value Date/Time   CHOL 180 03/14/2017 0845   TRIG 214 (H) 03/14/2017 0845   HDL 34 (L) 03/14/2017 0845   CHOLHDL 5.3 (H) 03/14/2017 0845   CHOLHDL 4.9 09/21/2015 1410   VLDL 38 (H) 09/21/2015 1410   LDLCALC 103 (H) 03/14/2017 0845    Additional studies/ records that were reviewed today include:    Cardiac catheterization 04/30/2007 Promus 2.58 mm DES to mid AV groove stenotic lesion, moderate RCA disease, no evidence of renal artery stenosis, normal left ventricular systolic function  ASSESSMENT:    1. Preop cardiovascular exam   2. CAD S/P percutaneous coronary angioplasty   3. Hyperlipidemia with target LDL less  than 70; statin intolerant   4. Essential hypertension      PLAN:  In order of problems listed above:  1. Preoperative clearance: Upcoming total hip surgery by Dr. Maureen Ralphs.  Patient can complete at least 4 METS of activity without any issue.  His RCRI preop score is class II, 0.9% of major cardiovascular complications during the surgery.  Patient can proceed with surgery from cardiology perspective.  He will need to hold Plavix for 7 days prior to the procedure and restart after the procedure as soon as possible at the discretion of the surgeon  2. CAD: Denies any obvious chest pain.  Currently on Plavix monotherapy.  3. Hypertension: Blood pressure stable on current therapy  4. Hyperlipidemia: Due for fasting lipid panel.  He has a history of intolerance to statins and Zetia.  May need to consider PCSK9 inhibitor.    Medication Adjustments/Labs and Tests Ordered: Current medicines are reviewed at length with the patient today.  Concerns regarding medicines are outlined above.  Medication changes, Labs and Tests ordered today are listed in the Patient Instructions below. Patient Instructions  Medication Instructions:  Your physician recommends that you continue on your current medications as directed. Please refer to the Current Medication list given to you today.  Labwork: Your physician recommends that you return for lab work in: 3-4 WEEKS FASTING LIPID  Testing/Procedures: NONE   Follow-Up: Your physician wants you to follow-up in: [place].  You will receive a reminder letter in the mail two months in advance. If you don't receive a letter, please call our office to schedule the follow-up appointment.  Any Other Special Instructions Will Be Listed Below (If Applicable). CLEARED FOR SURGERY If you need a refill on your cardiac medications before your next appointment, please call your pharmacy.     Signed, Almyra Deforest, PA  03/01/2018 10:38 PM    [facility] [place], [place], [place]  [address] Phone: [phone]; Fax: [phone]

## 2018-02-27 NOTE — Patient Instructions (Signed)
Medication Instructions:  Your physician recommends that you continue on your current medications as directed. Please refer to the Current Medication list given to you today.  Labwork: Your physician recommends that you return for lab work in: 3-4 WEEKS FASTING LIPID  Testing/Procedures: NONE   Follow-Up: Your physician wants you to follow-up in: [place].  You will receive a reminder letter in the mail two months in advance. If you don't receive a letter, please call our office to schedule the follow-up appointment.  Any Other Special Instructions Will Be Listed Below (If Applicable). CLEARED FOR SURGERY If you need a refill on your cardiac medications before your next appointment, please call your pharmacy.

## 2018-03-01 ENCOUNTER — Encounter: Payer: Self-pay | Admitting: Physician Assistant

## 2018-03-01 NOTE — Telephone Encounter (Signed)
Message sent to [facility]

## 2018-03-02 ENCOUNTER — Other Ambulatory Visit: Payer: Self-pay | Admitting: Cardiovascular Disease

## 2018-03-03 ENCOUNTER — Other Ambulatory Visit: Payer: Self-pay | Admitting: Orthopedic Surgery

## 2018-03-03 MED ORDER — FUROSEMIDE 20 MG PO TABS: 20.0000 mg | ORAL_TABLET | Freq: Every day | ORAL | 3 refills | Status: DC

## 2018-03-03 NOTE — H&P (Signed)
TOTAL HIP ADMISSION H&P  Patient is admitted for right total hip arthroplasty.  Subjective:  Chief Complaint: right hip pain  HPI: Samuel Chambers, 67 y.o. male, has a history of pain and functional disability in the right hip(s) due to arthritis and patient has failed non-surgical conservative treatments for greater than 12 weeks to include use of assistive devices and activity modification.  Onset of symptoms was abrupt starting less than 1 year ago with rapidly worsening course since that time.The patient noted no past surgery on the right hip(s).  Patient currently rates pain in the right hip at 10 out of 10 with activity. Patient has night pain, worsening of pain with activity and weight bearing and catching. Patient has evidence of bone-on-bone arthritis with large osteophyte formation by imaging studies. This condition presents safety issues increasing the risk of falls. There is no current active infection.  Patient Active Problem List   Diagnosis Date Noted  . Chest pain with moderate risk for cardiac etiology 11/15/2015  . Upper airway cough syndrome 06/06/2015  . Severe obesity (BMI 35.0-35.9 with comorbidity) ([place]) 08/04/2014  . Hyperlipidemia with target LDL less than 70; statin intolerant 04/29/2013  . CAD S/P percutaneous coronary angioplasty   . S/P laparoscopic cholecystectomy July 2014 02/18/2013  . Gallstones 01/28/2013  . OA (osteoarthritis) of knee 09/04/2012  . Acute medial meniscal tear 03/27/2012  . Anxiety state 02/24/2007  . Essential hypertension 02/24/2007  . GERD 02/24/2007  . DEGENERATIVE JOINT DISEASE, GENERALIZED 02/24/2007   Past Medical History:  Diagnosis Date  . Adenomatous colon polyp 03/2005  . Anxiety   . CAD S/P percutaneous coronary angioplasty 2008   PCI to circumflex with a Promus DES 2.5 mm x 8 mm  . Degenerative joint disease   . Diverticulosis   . Fibromyalgia   . GERD (gastroesophageal reflux disease)   . Hyperlipidemia    statin  intolerant  . Hypertension   . Internal hemorrhoids   . Obesity, Class II, BMI 35-39.9   . PONV (postoperative nausea and vomiting)     Past Surgical History:  Procedure Laterality Date  . CHOLECYSTECTOMY N/A 02/18/2013   Procedure: LAPAROSCOPIC CHOLECYSTECTOMY WITH INTRAOPERATIVE CHOLANGIOGRAM;  Surgeon: Pedro Earls, MD;  Location: [facility];  Service: General;  Laterality: N/A;  . COLONOSCOPY    . CORONARY ANGIOPLASTY WITH STENT PLACEMENT  04/30/2007   2.5 mm Promus stent that was to 95% Circ;had 60-70% lesion to RCA  . DOPPLER ECHOCARDIOGRAPHY  05/27/2002   [facility].-normal EF 55-66%,  . HEEL SPUR SURGERY    . HEMORRHOID SURGERY    . hip surgery     complete  . Holter Monitor  04/07/2007   sinus tachy.;  . KNEE ARTHROSCOPY  03/27/2012   Procedure: ARTHROSCOPY KNEE;  Surgeon: Gearlean Alf, MD;  Location: [facility];  Service: Orthopedics;  Laterality: Left;  . NM MYOCAR PERF WALL MOTION  12/19/2011   EXERCISED FOR 8-1/2 MINUTES RECHING 10 METABOLIC EQUIVALENTS. NO EVIDENCE  OF ISCHEMIA OR INFARCTION . EF 71%  . renal dopplers  04/08/2007   relatively normal  . TOTAL KNEE ARTHROPLASTY Left 09/04/2012   Procedure: TOTAL KNEE ARTHROPLASTY;  Surgeon: Gearlean Alf, MD;  Location: [facility];  Service: Orthopedics;  Laterality: Left;    No current facility-administered medications for this encounter.    Current Outpatient Medications  Medication Sig Dispense Refill Last Dose  . clopidogrel (PLAVIX) 75 MG tablet TAKE 1 TABLET BY MOUTH EVERY DAY 90 tablet 0 Taking  .  DULoxetine (CYMBALTA) 60 MG capsule Take 1 capsule (60 mg total) by mouth daily. 90 capsule 3 Taking  . furosemide (LASIX) 20 MG tablet Take 1 tablet (20 mg total) by mouth daily. Please schedule appointment for refills. 90 tablet 0 Taking  . hydrochlorothiazide (HYDRODIURIL) 25 MG tablet Take 1 tablet (25 mg total) by mouth daily. 90 tablet 3 Taking  . losartan (COZAAR) 100 MG tablet Take 1 tablet (100 mg total) by mouth  daily. Please call to make appointment for further refills 30 tablet 0 Taking  . metoprolol succinate (TOPROL-XL) 50 MG 24 hr tablet Take 1 tablet (50 mg total) by mouth daily. Take with or immediately following a meal. 90 tablet 3 Taking  . pantoprazole (PROTONIX) 40 MG tablet TAKE 1 TABLET BY MOUTH EVERY DAY 30-60 MINUTES BEFORE FIRST MEAL 90 tablet 4 Taking  . sildenafil (REVATIO) 20 MG tablet TAKE 2 TABLETS BY MOUTH AS NEEDED  11 Taking   Allergies  Allergen Reactions  . Gabapentin     Pt reports increased heart rate and blood pressure  . Crestor [Rosuvastatin] Other (See Comments)    myalgias  . Oxycodone Other (See Comments)    Hallucinations-ok in small doses  . Prednisone Other (See Comments)    Increased BP and HR in high doses  . Valsartan Other (See Comments)    Made patient feel tired and no energy  . Warfarin And Related Other (See Comments)    Fast heart beat, went down hill, felt like he was dying    Social History   Tobacco Use  . Smoking status: Never Smoker  . Smokeless tobacco: Never Used  Substance Use Topics  . Alcohol use: No    Family History  Problem Relation Age of Onset  . Breast cancer Mother   . Diabetes Mother   . Prostate cancer Father   . Colon cancer Neg Hx      Review of Systems  Constitutional: Negative for chills and fever.  HENT: Negative for congestion, sore throat and tinnitus.   Eyes: Negative for double vision, photophobia and pain.  Respiratory: Negative for cough, shortness of breath and wheezing.   Cardiovascular: Negative for chest pain, palpitations and orthopnea.  Gastrointestinal: Negative for heartburn, nausea and vomiting.  Genitourinary: Negative for dysuria, frequency and urgency.  Musculoskeletal: Positive for joint pain.  Neurological: Negative for dizziness, weakness and headaches.  Psychiatric/Behavioral: Negative for depression.    Objective:  Physical Exam  Overweight and well developed. General: Alert and  oriented x3, cooperative and pleasant, no acute distress. Head: normocephalic, atraumatic, neck supple. Eyes: EOMI. Respiratory: breath sounds clear in all fields, no wheezing, rales, or rhonchi. Cardiovascular: Regular rate and rhythm, no murmurs, gallops or rubs.  Abdomen: non-tender to palpation and soft, normoactive bowel sounds. Musculoskeletal: Significantly antalgic gait pattern favoring the right side without the use of assisted devices. Right Hip Exam: ROM: Flexion to 90, Internal Rotation 0, External Rotation 20, and Abduction 20/30 with pain. There is no tenderness over the greater trochanter bursa.  Left Hip Exam: ROM: Flexion to 120, Internal Rotation 30, External Rotation 40, and Abduction 40 without discomfort. There is no tenderness over the greater trochanter bursa. Calves soft and nontender. Motor function intact in LE. Strength 5/5 LE bilaterally. Neuro: Distal pulses 2+. Sensation to light touch intact in LE.  Vital signs in last 24 hours:  Blood pressure: 118/80 mmHg Pulse: 88 bpm  Labs:   Estimated body mass index is 38.92 kg/m as calculated from  the following:   Height as of 02/27/18: 6' (1.829 m).   Weight as of 02/27/18: 130.2 kg (287 lb).   Imaging Review Plain radiographs demonstrate severe degenerative joint disease of the right hip(s). The bone quality appears to be adequate for age and reported activity level.    Preoperative templating of the joint replacement has been completed, documented, and submitted to the Operating Room personnel in order to optimize intra-operative equipment management.     Assessment/Plan:  End stage arthritis, right hip(s)  The patient history, physical examination, clinical judgement of the provider and imaging studies are consistent with end stage degenerative joint disease of the right hip(s) and total hip arthroplasty is deemed medically necessary. The treatment options including medical management, injection therapy,  arthroscopy and arthroplasty were discussed at length. The risks and benefits of total hip arthroplasty were presented and reviewed. The risks due to aseptic loosening, infection, stiffness, dislocation/subluxation,  thromboembolic complications and other imponderables were discussed.  The patient acknowledged the explanation, agreed to proceed with the plan and consent was signed. Patient is being admitted for inpatient treatment for surgery, pain control, PT, OT, prophylactic antibiotics, VTE prophylaxis, progressive ambulation and ADL's and discharge planning.The patient is planning to be discharged home with home exercise program.    Therapy Plans: HEP Disposition: Home with wife Planned DVT Prophylaxis: Plavix (pt takes due to cardiac stent placement) DME needed: None PCP: Juanda Crumble, PA-C ([place]) Cardiology: Almyra Deforest, PA-C (cardiac clearance provided) TXA: IV Allergies: warfarin, gabapentin  - Patient was instructed on what medications to stop prior to surgery. - Follow-up visit in 2 weeks with Dr. Wynelle Link - Begin physical therapy following surgery - Pre-operative lab work as pre-surgical testing - Prescriptions will be provided in hospital at time of discharge  Theresa Duty, PA-C Orthopedic Surgery [facility] [place]

## 2018-03-03 NOTE — Care Plan (Signed)
R THA scheduled on 03-16-18.   DCP:  Home with spouse.  1 story home with 5 ste. DME:  No needs.  Has a RW and 3-in-1. PT:  HEP

## 2018-03-04 ENCOUNTER — Other Ambulatory Visit: Payer: Self-pay | Admitting: Physician Assistant

## 2018-03-04 ENCOUNTER — Encounter: Payer: Self-pay | Admitting: Physician Assistant

## 2018-03-04 DIAGNOSIS — R7309 Other abnormal glucose: Secondary | ICD-10-CM

## 2018-03-04 DIAGNOSIS — Z01818 Encounter for other preprocedural examination: Secondary | ICD-10-CM

## 2018-03-04 DIAGNOSIS — Z131 Encounter for screening for diabetes mellitus: Secondary | ICD-10-CM

## 2018-03-06 NOTE — Patient Instructions (Signed)
[facility]  03/06/2018   Your procedure is scheduled on: 03-16-18   Report to [facility]  Entrance    Report to admitting at 11:15AM    Call this number if you have problems the morning of surgery [phone]    Remember: Do not eat food After Midnight. YOU MAY HAVE CLEAR LIQUIDS FROM MIDNIGHT UNTIL 7:45AM. NOTHING BY MOUTH AFTER 7:45AM!       CLEAR LIQUID DIET   Foods Allowed                                                                     Foods Excluded  Coffee and tea, regular and decaf                             liquids that you cannot  Plain Jell-O in any flavor                                             see through such as: Fruit ices (not with fruit pulp)                                     milk, soups, orange juice  Iced Popsicles                                    All solid food Carbonated beverages, regular and diet                                    Cranberry, grape and apple juices Sports drinks like Gatorade Lightly seasoned clear broth or consume(fat free) Sugar, honey syrup  Sample Menu Breakfast                                Lunch                                     Supper Cranberry juice                    Beef broth                            Chicken broth Jell-O                                     Grape juice                           Apple juice Coffee or  tea                        Jell-O                                      Popsicle                                                Coffee or tea                        Coffee or tea  _____________________________________________________________________     Take these medicines the morning of surgery with A SIP OF WATER: Cymbalta, metoprolol, pantoprazole                                You may not have any metal on your body including hair pins and              piercings  Do not wear jewelry, make-up, lotions, powders or perfumes, deodorant               Men  may shave face and neck.   Do not bring valuables to the [facility]. [facility].  Contacts, dentures or bridgework may not be worn into surgery.  Leave suitcase in the car. After surgery it may be brought to your room.                Please read over the following fact sheets you were given: _____________________________________________________________________             [facility] - Preparing for Surgery Before surgery, you can play an important role.  Because skin is not sterile, your skin needs to be as free of germs as possible.  You can reduce the number of germs on your skin by washing with CHG (chlorahexidine gluconate) soap before surgery.  CHG is an antiseptic cleaner which kills germs and bonds with the skin to continue killing germs even after washing. Please DO NOT use if you have an allergy to CHG or antibacterial soaps.  If your skin becomes reddened/irritated stop using the CHG and inform your nurse when you arrive at Short Stay. Do not shave (including legs and underarms) for at least 48 hours prior to the first CHG shower.  You may shave your face/neck. Please follow these instructions carefully:  1.  Shower with CHG Soap the night before surgery and the  morning of Surgery.  2.  If you choose to wash your hair, wash your hair first as usual with your  normal  shampoo.  3.  After you shampoo, rinse your hair and body thoroughly to remove the  shampoo.                           4.  Use CHG as you would any other liquid soap.  You can apply chg directly  to the skin and wash  Gently with a scrungie or clean washcloth.  5.  Apply the CHG Soap to your body ONLY FROM THE NECK DOWN.   Do not use on face/ open                           Wound or open sores. Avoid contact with eyes, ears mouth and genitals (private parts).                       Wash face,  Genitals (private parts) with your normal soap.              6.  Wash thoroughly, paying special attention to the area where your surgery  will be performed.  7.  Thoroughly rinse your body with warm water from the neck down.  8.  DO NOT shower/wash with your normal soap after using and rinsing off  the CHG Soap.                9.  Pat yourself dry with a clean towel.            10.  Wear clean pajamas.            11.  Place clean sheets on your bed the night of your first shower and do not  sleep with pets. Day of Surgery : Do not apply any lotions/deodorants the morning of surgery.  Please wear clean clothes to the [facility]/surgery [facility].  FAILURE TO FOLLOW THESE INSTRUCTIONS MAY RESULT IN THE CANCELLATION OF YOUR SURGERY PATIENT SIGNATURE_________________________________  NURSE SIGNATURE__________________________________  ________________________________________________________________________   [facility]  An incentive spirometer is a tool that can help keep your lungs clear and active. This tool measures how well you are filling your lungs with each breath. Taking long deep breaths may help reverse or decrease the chance of developing breathing (pulmonary) problems (especially infection) following:  A long period of time when you are unable to move or be active. BEFORE THE PROCEDURE   If the spirometer includes an indicator to show your best effort, your nurse or respiratory therapist will set it to a desired goal.  If possible, sit up straight or lean slightly forward. Try not to slouch.  Hold the incentive spirometer in an upright position. INSTRUCTIONS FOR USE  1. Sit on the edge of your bed if possible, or sit up as far as you can in bed or on a chair. 2. Hold the incentive spirometer in an upright position. 3. Breathe out normally. 4. Place the mouthpiece in your mouth and seal your lips tightly around it. 5. Breathe in slowly and as deeply as possible, raising the piston or the ball toward the top of the  column. 6. Hold your breath for 3-5 seconds or for as long as possible. Allow the piston or ball to fall to the bottom of the column. 7. Remove the mouthpiece from your mouth and breathe out normally. 8. Rest for a few seconds and repeat Steps 1 through 7 at least 10 times every 1-2 hours when you are awake. Take your time and take a few normal breaths between deep breaths. 9. The spirometer may include an indicator to show your best effort. Use the indicator as a goal to work toward during each repetition. 10. After each set of 10 deep breaths, practice coughing to be sure your lungs are clear. If you have an incision (the cut made at the time of  surgery), support your incision when coughing by placing a pillow or rolled up towels firmly against it. Once you are able to get out of bed, walk around indoors and cough well. You may stop using the incentive spirometer when instructed by your caregiver.  RISKS AND COMPLICATIONS  Take your time so you do not get dizzy or light-headed.  If you are in pain, you may need to take or ask for pain medication before doing incentive spirometry. It is harder to take a deep breath if you are having pain. AFTER USE  Rest and breathe slowly and easily.  It can be helpful to keep track of a log of your progress. Your caregiver can provide you with a simple table to help with this. If you are using the spirometer at home, follow these instructions: [place] IF:   You are having difficultly using the spirometer.  You have trouble using the spirometer as often as instructed.  Your pain medication is not giving enough relief while using the spirometer.  You develop fever of 100.5 F (38.1 C) or higher. SEEK IMMEDIATE MEDICAL CARE IF:   You cough up bloody sputum that had not been present before.  You develop fever of 102 F (38.9 C) or greater.  You develop worsening pain at or near the incision site. MAKE SURE YOU:   Understand these  instructions.  Will watch your condition.  Will get help right away if you are not doing well or get worse. Document Released: 11/25/2006 Document Revised: 10/07/2011 Document Reviewed: 01/26/2007 ExitCare Patient Information 2014 ExitCare, [place].   ________________________________________________________________________  WHAT IS A BLOOD TRANSFUSION? Blood Transfusion Information  A transfusion is the replacement of blood or some of its parts. Blood is made up of multiple cells which provide different functions.  Red blood cells carry oxygen and are used for blood loss replacement.  White blood cells fight against infection.  Platelets control bleeding.  Plasma helps clot blood.  Other blood products are available for specialized needs, such as hemophilia or other clotting disorders. BEFORE THE TRANSFUSION  Who gives blood for transfusions?   Healthy volunteers who are fully evaluated to make sure their blood is safe. This is blood bank blood. Transfusion therapy is the safest it has ever been in the practice of medicine. Before blood is taken from a donor, a complete history is taken to make sure that person has no history of diseases nor engages in risky social behavior (examples are intravenous drug use or sexual activity with multiple partners). The donor's travel history is screened to minimize risk of transmitting infections, such as malaria. The donated blood is tested for signs of infectious diseases, such as HIV and hepatitis. The blood is then tested to be sure it is compatible with you in order to minimize the chance of a transfusion reaction. If you or a relative donates blood, this is often done in anticipation of surgery and is not appropriate for emergency situations. It takes many days to process the donated blood. RISKS AND COMPLICATIONS Although transfusion therapy is very safe and saves many lives, the main dangers of transfusion include:   Getting an infectious  disease.  Developing a transfusion reaction. This is an allergic reaction to something in the blood you were given. Every precaution is taken to prevent this. The decision to have a blood transfusion has been considered carefully by your caregiver before blood is given. Blood is not given unless the benefits outweigh the risks. AFTER THE  TRANSFUSION  Right after receiving a blood transfusion, you will usually feel much better and more energetic. This is especially true if your red blood cells have gotten low (anemic). The transfusion raises the level of the red blood cells which carry oxygen, and this usually causes an energy increase.  The nurse administering the transfusion will monitor you carefully for complications. HOME CARE INSTRUCTIONS  No special instructions are needed after a transfusion. You may find your energy is better. Speak with your caregiver about any limitations on activity for underlying diseases you may have. SEEK MEDICAL CARE IF:   Your condition is not improving after your transfusion.  You develop redness or irritation at the intravenous (IV) site. SEEK IMMEDIATE MEDICAL CARE IF:  Any of the following symptoms occur over the next 12 hours:  Shaking chills.  You have a temperature by mouth above 102 F (38.9 C), not controlled by medicine.  Chest, back, or muscle pain.  People around you feel you are not acting correctly or are confused.  Shortness of breath or difficulty breathing.  Dizziness and fainting.  You get a rash or develop hives.  You have a decrease in urine output.  Your urine turns a dark color or changes to pink, red, or brown. Any of the following symptoms occur over the next 10 days:  You have a temperature by mouth above 102 F (38.9 C), not controlled by medicine.  Shortness of breath.  Weakness after normal activity.  The white part of the eye turns yellow (jaundice).  You have a decrease in the amount of urine or are  urinating less often.  Your urine turns a dark color or changes to pink, red, or brown. Document Released: 07/12/2000 Document Revised: 10/07/2011 Document Reviewed: 02/29/2008 [facility] Patient Information 2014 [place], [place].  _______________________________________________________________________

## 2018-03-06 NOTE — Progress Notes (Signed)
Cardiac clearance , Almyra Deforest, PA on chart   ekg 02-27-18 epic  cxr 12-25-17 epic

## 2018-03-09 ENCOUNTER — Encounter (HOSPITAL_COMMUNITY)
Admission: RE | Admit: 2018-03-09 | Discharge: 2018-03-09 | Disposition: A | Discharge status: Home / Self Care | Payer: Medicare Other | Source: Ambulatory Visit | Attending: Orthopedic Surgery | Admitting: Orthopedic Surgery

## 2018-03-09 ENCOUNTER — Other Ambulatory Visit: Payer: Self-pay

## 2018-03-09 ENCOUNTER — Encounter (HOSPITAL_COMMUNITY): Payer: Self-pay

## 2018-03-09 DIAGNOSIS — M1611 Unilateral primary osteoarthritis, right hip: Secondary | ICD-10-CM | POA: Diagnosis not present

## 2018-03-09 DIAGNOSIS — Z01812 Encounter for preprocedural laboratory examination: Secondary | ICD-10-CM | POA: Insufficient documentation

## 2018-03-09 LAB — CBC
HCT: 42.1 % (ref 39.0–52.0)
HEMOGLOBIN: 14.6 g/dL (ref 13.0–17.0)
MCH: 29.1 pg (ref 26.0–34.0)
MCHC: 34.7 g/dL (ref 30.0–36.0)
MCV: 83.9 fL (ref 78.0–100.0)
PLATELETS: 261 10*3/uL (ref 150–400)
RBC: 5.02 MIL/uL (ref 4.22–5.81)
RDW: 13.8 % (ref 11.5–15.5)
WBC: 10.2 10*3/uL (ref 4.0–10.5)

## 2018-03-09 LAB — PROTIME-INR
INR: 0.94
Prothrombin Time: 12.5 seconds (ref 11.4–15.2)

## 2018-03-09 LAB — COMPREHENSIVE METABOLIC PANEL
ALK PHOS: 50 U/L (ref 38–126)
ALT: 36 U/L (ref 0–44)
AST: 33 U/L (ref 15–41)
Albumin: 4.1 g/dL (ref 3.5–5.0)
Anion gap: 12 (ref 5–15)
BUN: 20 mg/dL (ref 8–23)
CALCIUM: 9.5 mg/dL (ref 8.9–10.3)
CO2: 29 mmol/L (ref 22–32)
CREATININE: 1.13 mg/dL (ref 0.61–1.24)
Chloride: 100 mmol/L (ref 98–111)
GFR calc Af Amer: >60 mL/min (ref >60)
GFR calc non Af Amer: >60 mL/min (ref >60)
GLUCOSE: 113 mg/dL — AB (ref 70–99)
Potassium: 3.5 mmol/L (ref 3.5–5.1)
Sodium: 141 mmol/L (ref 135–145)
Total Bilirubin: 0.5 mg/dL (ref 0.3–1.2)
Total Protein: 7.5 g/dL (ref 6.5–8.1)

## 2018-03-09 LAB — APTT: aPTT: 28 seconds (ref 24–36)

## 2018-03-09 LAB — SURGICAL PCR SCREEN
MRSA, PCR: NEGATIVE
STAPHYLOCOCCUS AUREUS: NEGATIVE

## 2018-03-12 ENCOUNTER — Encounter

## 2018-03-12 ENCOUNTER — Other Ambulatory Visit: Payer: Self-pay | Admitting: Cardiovascular Disease

## 2018-03-12 ENCOUNTER — Ambulatory Visit: Payer: Medicare Other | Admitting: Physician Assistant

## 2018-03-12 NOTE — Telephone Encounter (Signed)
Rx request sent to pharmacy.  

## 2018-03-13 ENCOUNTER — Telehealth: Payer: Self-pay

## 2018-03-13 NOTE — Telephone Encounter (Signed)
Received fax from [facility] on 08/15 requesting medical clearance for surgery that is occurring on Monday 08/19.  Call to pt, advised of request and confirmed surgery date.  Provider is on vacation today but will be in clinic Saturday 08/17.  Last OV was in May, which was for acute illness.  Requested that pt schedule OV for 08/17 to clear for surgery.  Pt states he was told he would not need clearance by PCP.  He will f/u with ortho and c/b if he needs appt.  Form placed in provider's box.

## 2018-03-13 NOTE — Progress Notes (Signed)
Spoke with patient wife lynn and she requested short stay fax number due to patient is getting medical clearance at [facility] o Saturday 03-14-18. Patient iwfe lynn willl bring surgical clearance note also day of surgery. From [facility].

## 2018-03-14 ENCOUNTER — Other Ambulatory Visit: Payer: Self-pay

## 2018-03-14 ENCOUNTER — Encounter: Payer: Self-pay | Admitting: Physician Assistant

## 2018-03-14 ENCOUNTER — Telehealth: Payer: Self-pay

## 2018-03-14 ENCOUNTER — Ambulatory Visit (INDEPENDENT_AMBULATORY_CARE_PROVIDER_SITE_OTHER): Payer: Medicare Other | Admitting: Physician Assistant

## 2018-03-14 VITALS — BP 128/82 | HR 105 | Temp 98.6°F | Resp 16 | Ht 71.0 in | Wt 287.0 lb

## 2018-03-14 DIAGNOSIS — Z130 Encounter for screening for diseases of the blood and blood-forming organs and certain disorders involving the immune mechanism: Secondary | ICD-10-CM

## 2018-03-14 DIAGNOSIS — Z1329 Encounter for screening for other suspected endocrine disorder: Secondary | ICD-10-CM

## 2018-03-14 DIAGNOSIS — Z13228 Encounter for screening for other metabolic disorders: Secondary | ICD-10-CM

## 2018-03-14 DIAGNOSIS — Z01818 Encounter for other preprocedural examination: Secondary | ICD-10-CM | POA: Diagnosis not present

## 2018-03-14 DIAGNOSIS — R7309 Other abnormal glucose: Secondary | ICD-10-CM | POA: Diagnosis not present

## 2018-03-14 LAB — POCT URINALYSIS DIP (MANUAL ENTRY)
Bilirubin, UA: NEGATIVE
Glucose, UA: NEGATIVE mg/dL
Ketones, POC UA: NEGATIVE mg/dL
Leukocytes, UA: NEGATIVE
Nitrite, UA: NEGATIVE
Protein Ur, POC: 30 mg/dL — AB
Spec Grav, UA: 1.025 (ref 1.010–1.025)
Urobilinogen, UA: 0.2 U/dL
pH, UA: 5.5 (ref 5.0–8.0)

## 2018-03-14 LAB — POCT GLYCOSYLATED HEMOGLOBIN (HGB A1C): Hemoglobin A1C: 6.9 % — AB (ref 4.0–5.6)

## 2018-03-14 NOTE — Telephone Encounter (Signed)
Surgery clearance letter sent via fax to [phone] and confirmation fax received 03/14/18 at 09:18 am.  Tried to fax clearance letter to [facility] but no available fax, called [facility] info # and no one able to give a fax number for facility only phone number [phone]). Dgaddy, CMA

## 2018-03-14 NOTE — Patient Instructions (Addendum)
Your Hemoglobin A1C is 6.9%. An HbA1c result of 6.5% or higher on two separate occasions is considered diagnostic of diabetes mellitus.   Please come back for a LAB ONLY A1C check once you are well recovered from your surgery to recheck this level.  If it is still elevated, we need to consider starting you on a medication to bring this down. Lifestyle changes will also help a lot. (see below) Also, refer to diabetes.org for more information.    Hemoglobin A1c Test Some of the sugar (glucose) that circulates in your blood sticks or binds to blood proteins. Hemoglobin (Hb or Hgb) is one type of blood protein that glucose binds to. It also carries oxygen in the red blood cells (RBCs). When glucose binds to Hb, the glucose-coated Hb is called glycated Hb. Once Hb is glycated, it remains that way for the life of the RBC. This is about 120 days. Rather than testing your blood glucose level on one single day, the hemoglobin A1c (HbA1c) test measures the average amount of glycated hemoglobin and, therefore, the average amount of glucose in your blood during the 3-4 months just before the test is done. The HbA1c test is used to monitor long-term control of blood sugar in people who have diabetes mellitus. The HbA1c test can also be used in addition to or in combination with fasting blood glucose level and oral glucose tolerance tests. What do the results mean? It is your responsibility to obtain your test results. Ask the lab or department performing the test when and how you will get your results. Contact your health care provider to discuss any questions you have about your results. Range of Normal Values Ranges for normal values may vary among different labs and hospitals. You should always check with your health care provider after having lab work or other tests done to discuss the meaning of your test results and whether your values are considered within normal limits. The ranges for normal HbA1c test results  are as follows:  Adult or child without diabetes: 4-5.9%.  Adult or child with diabetes and good blood glucose control: less than 6.5%.  Several factors can affect HbA1c test results. These may include:  Diseases (hemoglobinopathies) that cause a change in the shape, size, or amount of Hb in your blood.  Longer than normal RBC life span.  Abnormally low levels of certain proteins in your blood.  Eating foods or taking supplements that are high in vitamin C (ascorbic acid).  Meaning of Results Outside Normal Value Ranges Abnormally high HbA1c values are most commonly an indication of prediabetes mellitus and diabetes mellitus:  An HbA1c result of 5.7-6.4% is considered diagnostic of prediabetes mellitus.  An HbA1c result of 6.5% or higher on two separate occasions is considered diagnostic of diabetes mellitus.   Diabetes Mellitus and Nutrition When you have diabetes (diabetes mellitus), it is very important to have healthy eating habits because your blood sugar (glucose) levels are greatly affected by what you eat and drink. Eating healthy foods in the appropriate amounts, at about the same times every day, can help you:  Control your blood glucose.  Lower your risk of heart disease.  Improve your blood pressure.  Reach or maintain a healthy weight.  Every person with diabetes is different, and each person has different needs for a meal plan. Your health care provider may recommend that you work with a diet and nutrition specialist (dietitian) to make a meal plan that is best for you. Your  meal plan may vary depending on factors such as:  The calories you need.  The medicines you take.  Your weight.  Your blood glucose, blood pressure, and cholesterol levels.  Your activity level.  Other health conditions you have, such as heart or kidney disease.  How do carbohydrates affect me? Carbohydrates affect your blood glucose level more than any other type of food. Eating  carbohydrates naturally increases the amount of glucose in your blood. Carbohydrate counting is a method for keeping track of how many carbohydrates you eat. Counting carbohydrates is important to keep your blood glucose at a healthy level, especially if you use insulin or take certain oral diabetes medicines. It is important to know how many carbohydrates you can safely have in each meal. This is different for every person. Your dietitian can help you calculate how many carbohydrates you should have at each meal and for snack. Foods that contain carbohydrates include:  Bread, cereal, rice, pasta, and crackers.  Potatoes and corn.  Peas, beans, and lentils.  Milk and yogurt.  Fruit and juice.  Desserts, such as cakes, cookies, ice cream, and candy.  How does alcohol affect me? Alcohol can cause a sudden decrease in blood glucose (hypoglycemia), especially if you use insulin or take certain oral diabetes medicines. Hypoglycemia can be a life-threatening condition. Symptoms of hypoglycemia (sleepiness, dizziness, and confusion) are similar to symptoms of having too much alcohol. If your health care provider says that alcohol is safe for you, follow these guidelines:  Limit alcohol intake to no more than 1 drink per day for nonpregnant women and 2 drinks per day for men. One drink equals 12 oz of beer, 5 oz of wine, or 1 oz of hard liquor.  Do not drink on an empty stomach.  Keep yourself hydrated with water, diet soda, or unsweetened iced tea.  Keep in mind that regular soda, juice, and other mixers may contain a lot of sugar and must be counted as carbohydrates.  What are tips for following this plan? Reading food labels  Start by checking the serving size on the label. The amount of calories, carbohydrates, fats, and other nutrients listed on the label are based on one serving of the food. Many foods contain more than one serving per package.  Check the total grams (g) of  carbohydrates in one serving. You can calculate the number of servings of carbohydrates in one serving by dividing the total carbohydrates by 15. For example, if a food has 30 g of total carbohydrates, it would be equal to 2 servings of carbohydrates.  Check the number of grams (g) of saturated and trans fats in one serving. Choose foods that have low or no amount of these fats.  Check the number of milligrams (mg) of sodium in one serving. Most people should limit total sodium intake to less than 2,300 mg per day.  Always check the nutrition information of foods labeled as "low-fat" or "nonfat". These foods may be higher in added sugar or refined carbohydrates and should be avoided.  Talk to your dietitian to identify your daily goals for nutrients listed on the label. Shopping  Avoid buying canned, premade, or processed foods. These foods tend to be high in fat, sodium, and added sugar.  Shop around the outside edge of the grocery store. This includes fresh fruits and vegetables, bulk grains, fresh meats, and fresh dairy. Cooking  Use low-heat cooking methods, such as baking, instead of high-heat cooking methods like deep frying.  Cook using healthy oils, such as olive, canola, or sunflower oil.  Avoid cooking with butter, cream, or high-fat meats. Meal planning  Eat meals and snacks regularly, preferably at the same times every day. Avoid going long periods of time without eating.  Eat foods high in fiber, such as fresh fruits, vegetables, beans, and whole grains. Talk to your dietitian about how many servings of carbohydrates you can eat at each meal.  Eat 4-6 ounces of lean protein each day, such as lean meat, chicken, fish, eggs, or tofu. 1 ounce is equal to 1 ounce of meat, chicken, or fish, 1 egg, or 1/4 cup of tofu.  Eat some foods each day that contain healthy fats, such as avocado, nuts, seeds, and fish. Lifestyle   Check your blood glucose regularly.  Exercise at least  30 minutes 5 or more days each week, or as told by your health care provider.  Take medicines as told by your health care provider.  Do not use any products that contain nicotine or tobacco, such as cigarettes and e-cigarettes. If you need help quitting, ask your health care provider.  Work with a Social worker or diabetes educator to identify strategies to manage stress and any emotional and social challenges. What are some questions to ask my health care provider?  Do I need to meet with a diabetes educator?  Do I need to meet with a dietitian?  What number can I call if I have questions?  When are the best times to check my blood glucose? Where to find more information:  American Diabetes Association: diabetes.org/food-and-fitness/food  Academy of Nutrition and Dietetics: PokerClues.dk  [employer] of Diabetes and Digestive and Kidney Diseases (NIH): ContactWire.be Summary  A healthy meal plan will help you control your blood glucose and maintain a healthy lifestyle.  Working with a diet and nutrition specialist (dietitian) can help you make a meal plan that is best for you.  Keep in mind that carbohydrates and alcohol have immediate effects on your blood glucose levels. It is important to count carbohydrates and to use alcohol carefully. This information is not intended to replace advice given to you by your health care provider. Make sure you discuss any questions you have with your health care provider. Document Released: 04/11/2005 Document Revised: 08/19/2016 Document Reviewed: 08/19/2016 Elsevier Interactive Patient Education  2018 [employer].  IF you received an x-ray today, you will receive an invoice from [facility]. Please contact [facility] at [phone] with questions or concerns regarding your invoice.   IF you  received labwork today, you will receive an invoice from [facility]. Please contact [facility] at [phone] with questions or concerns regarding your invoice.   Our billing staff will not be able to assist you with questions regarding bills from these companies.  You will be contacted with the lab results as soon as they are available. The fastest way to get your results is to activate your My Chart account. Instructions are located on the last page of this paperwork. If you have not heard from [place] regarding the results in 2 weeks, please contact this office.

## 2018-03-14 NOTE — Progress Notes (Signed)
Samuel Chambers  MRN: [id number] DOB: [birth year]  PCP: Dorise Hiss, PA-C  Subjective:  Pt is a 67 year old male who presents to clinic for preop clearance for right total hip arthroplasty.  He is here today with his wife. He was recently evaluated and cleared for surgery by his cardiologist Dr. Eulas Post last month. Recent preop lab evaluation included protime-INR, CMP, CBC, APTT, type and screen -all of which were within normal limits. His surgery is scheduled for next week  1. Do you usually get chest pain or breathlessness when you climb up two flights of stairs at normal speed?  No  2. Do you have kidney disease?  No  3. Has anyone in your family (blood relatives) had a problem following an anaesthetic?  No  4. Have you ever had a heart attack?  No  5. Have you ever been diagnosed with an irregular heartbeat?   6. Have you ever had a stroke?  No no  7. If you have been put to sleep for an operation were there any anaesthetic problems?  No  8. Do you suffer from epilepsy or seizures?  No  9. Do you have any problems with pain, stiffness or arthritis in your neck or jaw?  No  10. Do you have thyroid disease?  No  11. Do you suffer from angina?  No  12. Do you have liver disease?  Now  13. Have you ever been diagnosed with heart failure?  No  14. Do you suffer from asthma? No.   15. Do you have diabetes that requires insulin?  No  16. Do you have diabetes that requires tablets only?  No  17. Do you suffer from bronchitis?  Occasionally    Review of Systems  Constitutional: Negative for chills, diaphoresis, fatigue, fever and unexpected weight change.  HENT: Negative for congestion, postnasal drip, rhinorrhea, sinus pressure, sinus pain, sneezing and sore throat.   Respiratory: Negative for cough, chest tightness, shortness of breath and wheezing.   Cardiovascular: Negative for chest pain and palpitations.  Musculoskeletal: Positive for arthralgias (Right hip) and gait  problem.    Patient Active Problem List   Diagnosis Date Noted  . Chest pain with moderate risk for cardiac etiology 11/15/2015  . Upper airway cough syndrome 06/06/2015  . Severe obesity (BMI 35.0-35.9 with comorbidity) ([place]) 08/04/2014  . Hyperlipidemia with target LDL less than 70; statin intolerant 04/29/2013  . CAD S/P percutaneous coronary angioplasty   . S/P laparoscopic cholecystectomy July 2014 02/18/2013  . Gallstones 01/28/2013  . OA (osteoarthritis) of knee 09/04/2012  . Acute medial meniscal tear 03/27/2012  . Anxiety state 02/24/2007  . Essential hypertension 02/24/2007  . GERD 02/24/2007  . DEGENERATIVE JOINT DISEASE, GENERALIZED 02/24/2007    Current Outpatient Medications on File Prior to Visit  Medication Sig Dispense Refill  . clopidogrel (PLAVIX) 75 MG tablet TAKE 1 TABLET BY MOUTH EVERY DAY 90 tablet 0  . DULoxetine (CYMBALTA) 60 MG capsule Take 1 capsule (60 mg total) by mouth daily. 90 capsule 3  . furosemide (LASIX) 20 MG tablet Take 1 tablet (20 mg total) by mouth daily. 90 tablet 3  . hydrochlorothiazide (HYDRODIURIL) 25 MG tablet TAKE 1 TABLET(25 MG) BY MOUTH DAILY 90 tablet 0  . losartan (COZAAR) 100 MG tablet Take 1 tablet (100 mg total) by mouth daily. Please call to make appointment for further refills 30 tablet 0  . metoprolol succinate (TOPROL-XL) 50 MG 24 hr tablet Take 1 tablet (  50 mg total) by mouth daily. Take with or immediately following a meal. 90 tablet 3  . pantoprazole (PROTONIX) 40 MG tablet TAKE 1 TABLET BY MOUTH EVERY DAY 30-60 MINUTES BEFORE FIRST MEAL 90 tablet 4  . sildenafil (REVATIO) 20 MG tablet TAKE 2 TABLETS BY MOUTH AS NEEDED  11   No current facility-administered medications on file prior to visit.     Allergies  Allergen Reactions  . Gabapentin     Pt reports increased heart rate and blood pressure  . Crestor [Rosuvastatin] Other (See Comments)    myalgias  . Oxycodone Other (See Comments)    Hallucinations-ok in small  doses  . Prednisone Other (See Comments)    Increased BP and HR in high doses  . Valsartan Other (See Comments)    Made patient feel tired and no energy  . Warfarin And Related Other (See Comments)    Fast heart beat, went down hill, felt like he was dying     Objective:  BP 128/82 (BP Location: Left Arm, Patient Position: Sitting, Cuff Size: Large)   Pulse (!) 105   Temp 98.6 F (37 C) (Oral)   Resp 16   Ht 5\' 11"  (1.803 m)   Wt 287 lb (130.2 kg)   SpO2 98%   BMI 40.03 kg/m   Physical Exam  Constitutional: He is oriented to person, place, and time. He appears well-developed and well-nourished.  Cardiovascular: Normal rate and regular rhythm.  Pulmonary/Chest: Effort normal. No respiratory distress.  Neurological: He is alert and oriented to person, place, and time.  Skin: Skin is warm and dry.  Psychiatric: He has a normal mood and affect. His behavior is normal. Judgment and thought content normal.  Vitals reviewed.  Results for orders placed or performed in visit on 03/14/18  POCT urinalysis dipstick  Result Value Ref Range   Color, UA yellow yellow   Clarity, UA clear clear   Glucose, UA negative negative mg/dL   Bilirubin, UA negative negative   Ketones, POC UA negative negative mg/dL   Spec Grav, UA 1.025 1.010 - 1.025   Blood, UA trace-lysed (A) negative   pH, UA 5.5 5.0 - 8.0   Protein Ur, POC =30 (A) negative mg/dL   Urobilinogen, UA 0.2 0.2 or 1.0 E.U./dL   Nitrite, UA Negative Negative   Leukocytes, UA Negative Negative  POCT glycosylated hemoglobin (Hb A1C)  Result Value Ref Range   Hemoglobin A1C 6.9 (A) 4.0 - 5.6 %   HbA1c POC (<> result, manual entry)     HbA1c, POC (prediabetic range)     HbA1c, POC (controlled diabetic range)      Assessment and Plan :  1. Preoperative clearance 2. Screening for endocrine, metabolic and immunity disorder - Samuel Chambers presents for preop clearance for right total hip arthroplasty. Recently evaluated and cleared  by his cardiologist Dr. Eulas Post last month. Recent preop lab evaluation included protime-INR, CMP, CBC, APTT, type and screen -all of which were within normal limits. He I believe he is a low risk pt, clearance letter faxed to Leonides Grills, RN Nurse Case Manager [facility] - [phone] and a copy given to pt.  - Microalbumin, urine - POCT urinalysis dipstick  3. Elevated glucose -Hemoglobin A1c elevated at 6.9%.  Discussed with patient need for lifestyle changes.  He will return to clinic a month or 2 after he is recovered from surgery for repeat testing to confirm this level.  Will consider starting metformin - POCT  glycosylated hemoglobin (Hb A1C)   Mercer Pod, PA-C  [facility] 03/14/2018 8:25 AM  Please note: Portions of this report may have been transcribed using dragon voice recognition software. Every effort was made to ensure accuracy; however, inadvertent computerized transcription errors may be present.

## 2018-03-15 LAB — MICROALBUMIN, URINE: Microalbumin, Urine: 16.9 ug/mL

## 2018-03-15 MED ORDER — DEXTROSE 5 % IV SOLN
3.0000 g | INTRAVENOUS | Status: AC
  Administered 2018-03-16: 3 g via INTRAVENOUS
  Filled 2018-03-15: qty 3

## 2018-03-16 ENCOUNTER — Inpatient Hospital Stay (HOSPITAL_COMMUNITY): Payer: Medicare Other | Admitting: Anesthesiology

## 2018-03-16 ENCOUNTER — Encounter (HOSPITAL_COMMUNITY): Payer: Self-pay | Admitting: *Deleted

## 2018-03-16 ENCOUNTER — Inpatient Hospital Stay (HOSPITAL_COMMUNITY): Payer: Medicare Other

## 2018-03-16 ENCOUNTER — Other Ambulatory Visit: Payer: Self-pay

## 2018-03-16 ENCOUNTER — Encounter (HOSPITAL_COMMUNITY)
Admission: RE | Disposition: A | Discharge status: Home / Self Care | Payer: Self-pay | Source: Home / Self Care | Attending: Orthopedic Surgery

## 2018-03-16 ENCOUNTER — Inpatient Hospital Stay (HOSPITAL_COMMUNITY)
Admission: RE | Admit: 2018-03-16 | Discharge: 2018-03-17 | DRG: 470 | Disposition: A | Discharge status: Home / Self Care | Payer: Medicare Other | Attending: Orthopedic Surgery | Admitting: Orthopedic Surgery

## 2018-03-16 DIAGNOSIS — Z9861 Coronary angioplasty status: Secondary | ICD-10-CM | POA: Diagnosis not present

## 2018-03-16 DIAGNOSIS — Z885 Allergy status to narcotic agent status: Secondary | ICD-10-CM

## 2018-03-16 DIAGNOSIS — Z6841 Body Mass Index (BMI) 40.0 and over, adult: Secondary | ICD-10-CM | POA: Diagnosis not present

## 2018-03-16 DIAGNOSIS — M1611 Unilateral primary osteoarthritis, right hip: Secondary | ICD-10-CM | POA: Diagnosis not present

## 2018-03-16 DIAGNOSIS — Z955 Presence of coronary angioplasty implant and graft: Secondary | ICD-10-CM | POA: Diagnosis not present

## 2018-03-16 DIAGNOSIS — Z7902 Long term (current) use of antithrombotics/antiplatelets: Secondary | ICD-10-CM

## 2018-03-16 DIAGNOSIS — Z888 Allergy status to other drugs, medicaments and biological substances status: Secondary | ICD-10-CM

## 2018-03-16 DIAGNOSIS — I10 Essential (primary) hypertension: Secondary | ICD-10-CM | POA: Diagnosis present

## 2018-03-16 DIAGNOSIS — E669 Obesity, unspecified: Secondary | ICD-10-CM | POA: Diagnosis not present

## 2018-03-16 DIAGNOSIS — Z96649 Presence of unspecified artificial hip joint: Secondary | ICD-10-CM

## 2018-03-16 DIAGNOSIS — I2510 Atherosclerotic heart disease of native coronary artery without angina pectoris: Secondary | ICD-10-CM | POA: Diagnosis not present

## 2018-03-16 DIAGNOSIS — E119 Type 2 diabetes mellitus without complications: Secondary | ICD-10-CM | POA: Diagnosis not present

## 2018-03-16 DIAGNOSIS — K219 Gastro-esophageal reflux disease without esophagitis: Secondary | ICD-10-CM | POA: Diagnosis not present

## 2018-03-16 DIAGNOSIS — Z79899 Other long term (current) drug therapy: Secondary | ICD-10-CM

## 2018-03-16 DIAGNOSIS — Z471 Aftercare following joint replacement surgery: Secondary | ICD-10-CM | POA: Diagnosis not present

## 2018-03-16 DIAGNOSIS — Z96641 Presence of right artificial hip joint: Secondary | ICD-10-CM | POA: Diagnosis not present

## 2018-03-16 DIAGNOSIS — E785 Hyperlipidemia, unspecified: Secondary | ICD-10-CM | POA: Diagnosis not present

## 2018-03-16 DIAGNOSIS — M169 Osteoarthritis of hip, unspecified: Secondary | ICD-10-CM | POA: Diagnosis present

## 2018-03-16 HISTORY — PX: TOTAL HIP ARTHROPLASTY: SHX124

## 2018-03-16 LAB — TYPE AND SCREEN
ABO/RH(D): B POS
Antibody Screen: NEGATIVE

## 2018-03-16 SURGERY — ARTHROPLASTY, HIP, TOTAL, ANTERIOR APPROACH
Anesthesia: Spinal | Site: Hip | Laterality: Right

## 2018-03-16 MED ORDER — DIPHENHYDRAMINE HCL 12.5 MG/5ML PO ELIX: 12.5000 mg | ORAL_SOLUTION | ORAL | Status: DC | PRN

## 2018-03-16 MED ORDER — MIDAZOLAM HCL 2 MG/2ML IJ SOLN
INTRAMUSCULAR | Status: AC
  Filled 2018-03-16: qty 2

## 2018-03-16 MED ORDER — BUPIVACAINE IN DEXTROSE 0.75-8.25 % IT SOLN
INTRATHECAL | Status: DC | PRN
  Administered 2018-03-16: 2 mL via INTRATHECAL

## 2018-03-16 MED ORDER — PROPOFOL 10 MG/ML IV BOLUS
INTRAVENOUS | Status: DC | PRN
  Administered 2018-03-16: 20 mg via INTRAVENOUS

## 2018-03-16 MED ORDER — SCOPOLAMINE 1 MG/3DAYS TD PT72
MEDICATED_PATCH | TRANSDERMAL | Status: AC
  Filled 2018-03-16: qty 1

## 2018-03-16 MED ORDER — LACTATED RINGERS IV SOLN
INTRAVENOUS | Status: DC
  Administered 2018-03-16 (×3): via INTRAVENOUS

## 2018-03-16 MED ORDER — POLYETHYLENE GLYCOL 3350 17 G PO PACK: 17.0000 g | PACK | Freq: Every day | ORAL | Status: DC | PRN

## 2018-03-16 MED ORDER — METOPROLOL SUCCINATE ER 50 MG PO TB24
50.0000 mg | ORAL_TABLET | Freq: Every day | ORAL | Status: DC
  Administered 2018-03-17: 50 mg via ORAL
  Filled 2018-03-16: qty 1

## 2018-03-16 MED ORDER — BISACODYL 10 MG RE SUPP: 10.0000 mg | Freq: Every day | RECTAL | Status: DC | PRN

## 2018-03-16 MED ORDER — BUPIVACAINE-EPINEPHRINE (PF) 0.25% -1:200000 IJ SOLN
INTRAMUSCULAR | Status: DC | PRN
  Administered 2018-03-16: 30 mL

## 2018-03-16 MED ORDER — MENTHOL 3 MG MT LOZG: 1.0000 | LOZENGE | OROMUCOSAL | Status: DC | PRN

## 2018-03-16 MED ORDER — FUROSEMIDE 20 MG PO TABS
20.0000 mg | ORAL_TABLET | Freq: Every day | ORAL | Status: DC
  Administered 2018-03-17: 20 mg via ORAL
  Filled 2018-03-16: qty 1

## 2018-03-16 MED ORDER — METHOCARBAMOL 500 MG IVPB - SIMPLE MED
500.0000 mg | Freq: Four times a day (QID) | INTRAVENOUS | Status: DC | PRN
  Administered 2018-03-16: 500 mg via INTRAVENOUS
  Filled 2018-03-16: qty 500
  Filled 2018-03-16: qty 50

## 2018-03-16 MED ORDER — FLEET ENEMA 7-19 GM/118ML RE ENEM: 1.0000 | ENEMA | Freq: Once | RECTAL | Status: DC | PRN

## 2018-03-16 MED ORDER — PANTOPRAZOLE SODIUM 40 MG PO TBEC
40.0000 mg | DELAYED_RELEASE_TABLET | Freq: Every day | ORAL | Status: DC
  Administered 2018-03-17: 40 mg via ORAL
  Filled 2018-03-16: qty 1

## 2018-03-16 MED ORDER — ACETAMINOPHEN 10 MG/ML IV SOLN
1000.0000 mg | Freq: Four times a day (QID) | INTRAVENOUS | Status: DC
  Administered 2018-03-16: 1000 mg via INTRAVENOUS
  Filled 2018-03-16: qty 100

## 2018-03-16 MED ORDER — CHLORHEXIDINE GLUCONATE 4 % EX LIQD: 60.0000 mL | Freq: Once | CUTANEOUS | Status: DC

## 2018-03-16 MED ORDER — METOCLOPRAMIDE HCL 5 MG PO TABS: 5.0000 mg | ORAL_TABLET | Freq: Three times a day (TID) | ORAL | Status: DC | PRN

## 2018-03-16 MED ORDER — STERILE WATER FOR IRRIGATION IR SOLN
Status: DC | PRN
  Administered 2018-03-16: 1000 mL

## 2018-03-16 MED ORDER — FENTANYL CITRATE (PF) 100 MCG/2ML IJ SOLN: 25.0000 ug | INTRAMUSCULAR | Status: DC | PRN

## 2018-03-16 MED ORDER — SODIUM CHLORIDE 0.9 % IR SOLN
Status: DC | PRN
  Administered 2018-03-16: 1000 mL

## 2018-03-16 MED ORDER — ONDANSETRON HCL 4 MG PO TABS: 4.0000 mg | ORAL_TABLET | Freq: Four times a day (QID) | ORAL | Status: DC | PRN

## 2018-03-16 MED ORDER — ONDANSETRON HCL 4 MG/2ML IJ SOLN
INTRAMUSCULAR | Status: AC
  Filled 2018-03-16: qty 2

## 2018-03-16 MED ORDER — SODIUM CHLORIDE 0.9 % IV SOLN
INTRAVENOUS | Status: DC | PRN
  Administered 2018-03-16: 25 ug/min via INTRAVENOUS

## 2018-03-16 MED ORDER — FENTANYL CITRATE (PF) 100 MCG/2ML IJ SOLN
INTRAMUSCULAR | Status: DC | PRN
  Administered 2018-03-16: 100 ug via INTRAVENOUS

## 2018-03-16 MED ORDER — LOSARTAN POTASSIUM 50 MG PO TABS
100.0000 mg | ORAL_TABLET | Freq: Every day | ORAL | Status: DC
  Administered 2018-03-17: 100 mg via ORAL
  Filled 2018-03-16: qty 2

## 2018-03-16 MED ORDER — METOCLOPRAMIDE HCL 5 MG/ML IJ SOLN: 5.0000 mg | Freq: Three times a day (TID) | INTRAMUSCULAR | Status: DC | PRN

## 2018-03-16 MED ORDER — MEPERIDINE HCL 50 MG/ML IJ SOLN: 6.2500 mg | INTRAMUSCULAR | Status: DC | PRN

## 2018-03-16 MED ORDER — PROPOFOL 10 MG/ML IV BOLUS
INTRAVENOUS | Status: AC
  Filled 2018-03-16: qty 60

## 2018-03-16 MED ORDER — ONDANSETRON HCL 4 MG/2ML IJ SOLN: 4.0000 mg | Freq: Once | INTRAMUSCULAR | Status: DC | PRN

## 2018-03-16 MED ORDER — SCOPOLAMINE 1 MG/3DAYS TD PT72
MEDICATED_PATCH | TRANSDERMAL | Status: DC | PRN
  Administered 2018-03-16: 1 via TRANSDERMAL

## 2018-03-16 MED ORDER — PROPOFOL 500 MG/50ML IV EMUL
INTRAVENOUS | Status: DC | PRN
  Administered 2018-03-16: 100 ug/kg/min via INTRAVENOUS

## 2018-03-16 MED ORDER — PROPOFOL 10 MG/ML IV BOLUS
INTRAVENOUS | Status: AC
  Filled 2018-03-16: qty 20

## 2018-03-16 MED ORDER — CEFAZOLIN SODIUM-DEXTROSE 2-4 GM/100ML-% IV SOLN
2.0000 g | Freq: Four times a day (QID) | INTRAVENOUS | Status: AC
  Administered 2018-03-16 – 2018-03-17 (×2): 2 g via INTRAVENOUS
  Filled 2018-03-16 (×2): qty 100

## 2018-03-16 MED ORDER — DEXAMETHASONE SODIUM PHOSPHATE 10 MG/ML IJ SOLN
INTRAMUSCULAR | Status: AC
  Filled 2018-03-16: qty 1

## 2018-03-16 MED ORDER — HYDROCODONE-ACETAMINOPHEN 5-325 MG PO TABS
1.0000 | ORAL_TABLET | ORAL | Status: DC | PRN
  Administered 2018-03-16 – 2018-03-17 (×3): 2 via ORAL
  Filled 2018-03-16 (×3): qty 2

## 2018-03-16 MED ORDER — BUPIVACAINE-EPINEPHRINE (PF) 0.25% -1:200000 IJ SOLN
INTRAMUSCULAR | Status: AC
  Filled 2018-03-16: qty 30

## 2018-03-16 MED ORDER — DULOXETINE HCL 60 MG PO CPEP
60.0000 mg | ORAL_CAPSULE | Freq: Every day | ORAL | Status: DC
  Administered 2018-03-17: 60 mg via ORAL
  Filled 2018-03-16: qty 1

## 2018-03-16 MED ORDER — ONDANSETRON HCL 4 MG/2ML IJ SOLN
INTRAMUSCULAR | Status: DC | PRN
  Administered 2018-03-16: 4 mg via INTRAVENOUS

## 2018-03-16 MED ORDER — FENTANYL CITRATE (PF) 100 MCG/2ML IJ SOLN
INTRAMUSCULAR | Status: AC
  Filled 2018-03-16: qty 2

## 2018-03-16 MED ORDER — ACETAMINOPHEN 500 MG PO TABS
500.0000 mg | ORAL_TABLET | Freq: Four times a day (QID) | ORAL | Status: DC
  Administered 2018-03-16 – 2018-03-17 (×2): 500 mg via ORAL
  Filled 2018-03-16 (×2): qty 1

## 2018-03-16 MED ORDER — METHOCARBAMOL 500 MG PO TABS
500.0000 mg | ORAL_TABLET | Freq: Four times a day (QID) | ORAL | Status: DC | PRN
  Administered 2018-03-17: 500 mg via ORAL
  Filled 2018-03-16: qty 1

## 2018-03-16 MED ORDER — TRANEXAMIC ACID 1000 MG/10ML IV SOLN
1000.0000 mg | INTRAVENOUS | Status: AC
  Administered 2018-03-16: 1000 mg via INTRAVENOUS
  Filled 2018-03-16: qty 10

## 2018-03-16 MED ORDER — DEXAMETHASONE SODIUM PHOSPHATE 10 MG/ML IJ SOLN
8.0000 mg | Freq: Once | INTRAMUSCULAR | Status: AC
  Administered 2018-03-16: 10 mg via INTRAVENOUS

## 2018-03-16 MED ORDER — SODIUM CHLORIDE 0.9 % IV SOLN
INTRAVENOUS | Status: DC
  Administered 2018-03-16: 19:00:00 via INTRAVENOUS

## 2018-03-16 MED ORDER — HYDROCODONE-ACETAMINOPHEN 7.5-325 MG PO TABS: 1.0000 | ORAL_TABLET | ORAL | Status: DC | PRN

## 2018-03-16 MED ORDER — CLOPIDOGREL BISULFATE 75 MG PO TABS
75.0000 mg | ORAL_TABLET | Freq: Every day | ORAL | Status: DC
  Administered 2018-03-17: 75 mg via ORAL
  Filled 2018-03-16: qty 1

## 2018-03-16 MED ORDER — DOCUSATE SODIUM 100 MG PO CAPS
100.0000 mg | ORAL_CAPSULE | Freq: Two times a day (BID) | ORAL | Status: DC
  Administered 2018-03-16 – 2018-03-17 (×2): 100 mg via ORAL
  Filled 2018-03-16 (×2): qty 1

## 2018-03-16 MED ORDER — PHENOL 1.4 % MT LIQD: 1.0000 | OROMUCOSAL | Status: DC | PRN

## 2018-03-16 MED ORDER — HYDROCHLOROTHIAZIDE 25 MG PO TABS
25.0000 mg | ORAL_TABLET | Freq: Every day | ORAL | Status: DC
  Administered 2018-03-17: 25 mg via ORAL
  Filled 2018-03-16: qty 1

## 2018-03-16 MED ORDER — ONDANSETRON HCL 4 MG/2ML IJ SOLN: 4.0000 mg | Freq: Four times a day (QID) | INTRAMUSCULAR | Status: DC | PRN

## 2018-03-16 MED ORDER — MORPHINE SULFATE (PF) 2 MG/ML IV SOLN: 0.5000 mg | INTRAVENOUS | Status: DC | PRN

## 2018-03-16 MED ORDER — ASPIRIN EC 325 MG PO TBEC
325.0000 mg | DELAYED_RELEASE_TABLET | Freq: Every day | ORAL | Status: DC
  Administered 2018-03-17: 325 mg via ORAL
  Filled 2018-03-16: qty 1

## 2018-03-16 MED ORDER — MIDAZOLAM HCL 5 MG/5ML IJ SOLN
INTRAMUSCULAR | Status: DC | PRN
  Administered 2018-03-16: 2 mg via INTRAVENOUS
  Administered 2018-03-16: 0.5 mg via INTRAVENOUS

## 2018-03-16 SURGICAL SUPPLY — 40 items
BAG DECANTER FOR FLEXI CONT (MISCELLANEOUS) ×3 IMPLANT
BAG ZIPLOCK 12X15 (MISCELLANEOUS) IMPLANT
BLADE SAG 18X100X1.27 (BLADE) ×3 IMPLANT
CLOSURE WOUND 1/2 X4 (GAUZE/BANDAGES/DRESSINGS) ×1
COVER PERINEAL POST (MISCELLANEOUS) ×3 IMPLANT
COVER SURGICAL LIGHT HANDLE (MISCELLANEOUS) ×3 IMPLANT
CUP ACETBLR 54 OD PINNACLE (Hips) ×3 IMPLANT
DECANTER SPIKE VIAL GLASS SM (MISCELLANEOUS) ×3 IMPLANT
DRAPE STERI IOBAN 125X83 (DRAPES) ×3 IMPLANT
DRAPE U-SHAPE 47X51 STRL (DRAPES) ×6 IMPLANT
DRSG ADAPTIC 3X8 NADH LF (GAUZE/BANDAGES/DRESSINGS) ×3 IMPLANT
DRSG MEPILEX BORDER 4X4 (GAUZE/BANDAGES/DRESSINGS) ×3 IMPLANT
DRSG MEPILEX BORDER 4X8 (GAUZE/BANDAGES/DRESSINGS) ×3 IMPLANT
DURAPREP 26ML APPLICATOR (WOUND CARE) ×3 IMPLANT
ELECT REM PT RETURN 15FT ADLT (MISCELLANEOUS) ×3 IMPLANT
EVACUATOR 1/8 PVC DRAIN (DRAIN) ×3 IMPLANT
GLOVE BIO SURGEON STRL SZ7 (GLOVE) IMPLANT
GLOVE BIO SURGEON STRL SZ8 (GLOVE) ×3 IMPLANT
GLOVE BIOGEL PI IND STRL 7.0 (GLOVE) ×7 IMPLANT
GLOVE BIOGEL PI IND STRL 8 (GLOVE) ×1 IMPLANT
GLOVE BIOGEL PI INDICATOR 7.0 (GLOVE) ×14
GLOVE BIOGEL PI INDICATOR 8 (GLOVE) ×2
GOWN STRL REUS W/TWL LRG LVL3 (GOWN DISPOSABLE) ×9 IMPLANT
GOWN STRL REUS W/TWL XL LVL3 (GOWN DISPOSABLE) ×3 IMPLANT
HEAD CERAMIC DELTA 36 PLUS 1.5 (Hips) ×3 IMPLANT
HOLDER FOLEY CATH W/STRAP (MISCELLANEOUS) ×3 IMPLANT
LINER MARATHON NEUT +4X54X36 (Hips) ×3 IMPLANT
MANIFOLD NEPTUNE II (INSTRUMENTS) ×3 IMPLANT
PACK ANTERIOR HIP CUSTOM (KITS) ×3 IMPLANT
STEM FEMORAL SZ5 HIGH ACTIS (Nail) ×3 IMPLANT
STRIP CLOSURE SKIN 1/2X4 (GAUZE/BANDAGES/DRESSINGS) ×2 IMPLANT
SUT ETHIBOND NAB CT1 #1 30IN (SUTURE) ×3 IMPLANT
SUT MNCRL AB 4-0 PS2 18 (SUTURE) ×3 IMPLANT
SUT STRATAFIX 0 PDS 27 VIOLET (SUTURE) ×3
SUT VIC AB 2-0 CT1 27 (SUTURE) ×4
SUT VIC AB 2-0 CT1 TAPERPNT 27 (SUTURE) ×2 IMPLANT
SUTURE STRATFX 0 PDS 27 VIOLET (SUTURE) ×1 IMPLANT
SYR 50ML LL SCALE MARK (SYRINGE) IMPLANT
TRAY FOLEY MTR SLVR 16FR STAT (SET/KITS/TRAYS/PACK) ×3 IMPLANT
YANKAUER SUCT BULB TIP 10FT TU (MISCELLANEOUS) ×3 IMPLANT

## 2018-03-16 NOTE — Anesthesia Procedure Notes (Signed)
Spinal  Patient location during procedure: OR Start time: 03/16/2018 2:12 PM Staffing Anesthesiologist: Josephine Igo, MD Performed: anesthesiologist  Preanesthetic Checklist Completed: patient identified, site marked, surgical consent, pre-op evaluation, timeout performed, IV checked, risks and benefits discussed and monitors and equipment checked Spinal Block Patient position: sitting Prep: site prepped and draped and DuraPrep Patient monitoring: heart rate, cardiac monitor, continuous pulse ox and blood pressure Approach: midline Location: L4-5 Injection technique: single-shot Needle Needle type: Pencan  Needle gauge: 24 G Needle length: 9 cm Needle insertion depth: 9 cm Assessment Sensory level: T4 Additional Notes Patient tolerated procedure well. Adequate sensory level.

## 2018-03-16 NOTE — Anesthesia Procedure Notes (Addendum)
Procedure Name: MAC Date/Time: 03/16/2018 2:06 PM Performed by: Claudia Desanctis, CRNA Pre-anesthesia Checklist: Patient identified, Emergency Drugs available, Suction available, Patient being monitored and Timeout performed Patient Re-evaluated:Patient Re-evaluated prior to induction Oxygen Delivery Method: Simple face mask Placement Confirmation: positive ETCO2 Comments: This wsa performed by Enrigue Catena CRNA not Dellie Catholic CRNA

## 2018-03-16 NOTE — Anesthesia Preprocedure Evaluation (Signed)
Anesthesia Evaluation  Patient identified by MRN, date of birth, ID band Patient awake    Reviewed: Allergy & Precautions, NPO status , Patient's Chart, lab work & pertinent test results, reviewed documented beta blocker date and time   History of Anesthesia Complications (+) PONV and history of anesthetic complications  Airway Mallampati: III  TM Distance: >3 FB Neck ROM: Full    Dental no notable dental hx. (+) Teeth Intact   Pulmonary neg pulmonary ROS,    Pulmonary exam normal breath sounds clear to auscultation       Cardiovascular hypertension, Pt. on medications and Pt. on home beta blockers + CAD and + Cardiac Stents  Normal cardiovascular exam Rhythm:Regular Rate:Normal  DES Left Cx 2008    Neuro/Psych Anxiety  Neuromuscular disease    GI/Hepatic Neg liver ROS, GERD  Medicated and Controlled,Hx/o Colon polyp   Endo/Other  diabetes, Well Controlled, Type 2, Oral Hypoglycemic AgentsMorbid obesityHyperlipidemia  Renal/GU negative Renal ROS  negative genitourinary   Musculoskeletal  (+) Arthritis , Osteoarthritis,  Fibromyalgia -DJD left hip   Abdominal (+) + obese,   Peds  Hematology Plavix- last dose 03/09/2018   Anesthesia Other Findings   Reproductive/Obstetrics                             Anesthesia Physical Anesthesia Plan  ASA: III  Anesthesia Plan: Spinal   Post-op Pain Management:    Induction:   PONV Risk Score and Plan: 3  Airway Management Planned: Natural Airway, Simple Face Mask and Nasal Cannula  Additional Equipment:   Intra-op Plan:   Post-operative Plan:   Informed Consent: I have reviewed the patients History and Physical, chart, labs and discussed the procedure including the risks, benefits and alternatives for the proposed anesthesia with the patient or authorized representative who has indicated his/her understanding and acceptance.   Dental  advisory given  Plan Discussed with: CRNA and Surgeon  Anesthesia Plan Comments:         Anesthesia Quick Evaluation

## 2018-03-16 NOTE — Interval H&P Note (Signed)
History and Physical Interval Note:  03/16/2018 1:13 PM  Samuel Chambers  has presented today for surgery, with the diagnosis of left hip osteoarthritis  The various methods of treatment have been discussed with the patient and family. After consideration of risks, benefits and other options for treatment, the patient has consented to  Procedure(s): RIGHT TOTAL HIP ARTHROPLASTY ANTERIOR APPROACH (Right) as a surgical intervention .  The patient's history has been reviewed, patient examined, no change in status, stable for surgery.  I have reviewed the patient's chart and labs.  Questions were answered to the patient's satisfaction.     Pilar Plate Samuel Chambers

## 2018-03-16 NOTE — Anesthesia Postprocedure Evaluation (Signed)
Anesthesia Post Note  Patient: Samuel Chambers  Procedure(s) Performed: RIGHT TOTAL HIP ARTHROPLASTY ANTERIOR APPROACH (Right Hip)     Patient location during evaluation: PACU Anesthesia Type: Spinal Level of consciousness: oriented and awake and alert Pain management: pain level controlled Vital Signs Assessment: post-procedure vital signs reviewed and stable Respiratory status: spontaneous breathing, respiratory function stable and nonlabored ventilation Cardiovascular status: blood pressure returned to baseline and stable Postop Assessment: no headache, no backache, no apparent nausea or vomiting, patient able to bend at knees and spinal receding Anesthetic complications: no    Last Vitals:  Vitals:   03/16/18 1655 03/16/18 1706  BP: (!) 153/85 (!) 155/92  Pulse: 77 72  Resp: 16 16  Temp: 36.7 C 36.8 C  SpO2: 100% 100%    Last Pain:  Vitals:   03/16/18 1630  TempSrc:   PainSc: 0-No pain                 Westin Knotts A.

## 2018-03-16 NOTE — Transfer of Care (Signed)
Immediate Anesthesia Transfer of Care Note  Patient: Samuel Chambers  Procedure(s) Performed: RIGHT TOTAL HIP ARTHROPLASTY ANTERIOR APPROACH (Right Hip)  Patient Location: PACU  Anesthesia Type:Spinal  Level of Consciousness: awake, alert , oriented and patient cooperative  Airway & Oxygen Therapy: Patient Spontanous Breathing and Patient connected to face mask oxygen  Post-op Assessment: Report given to RN and Post -op Vital signs reviewed and stable  Post vital signs: stable  Last Vitals:  Vitals Value Taken Time  BP 148/82 03/16/2018  4:15 PM  Temp 36.4 C 03/16/2018  4:10 PM  Pulse 85 03/16/2018  4:21 PM  Resp 16 03/16/2018  4:21 PM  SpO2 100 % 03/16/2018  4:21 PM  Vitals shown include unvalidated device data.  Last Pain:  Vitals:   03/16/18 1610  TempSrc:   PainSc: 0-No pain         Complications: No apparent anesthesia complications

## 2018-03-16 NOTE — Discharge Instructions (Signed)
Dr. Gaynelle Arabian Total Joint Specialist [facility] [address], [place], [place] [address] [phone]  ANTERIOR APPROACH TOTAL HIP REPLACEMENT POSTOPERATIVE DIRECTIONS   Hip Rehabilitation, Guidelines Following Surgery  The results of a hip operation are greatly improved after range of motion and muscle strengthening exercises. Follow all safety measures which are given to protect your hip. If any of these exercises cause increased pain or swelling in your joint, decrease the amount until you are comfortable again. Then slowly increase the exercises. Call your caregiver if you have problems or questions.   HOME CARE INSTRUCTIONS  Remove items at home which could result in a fall. This includes throw rugs or furniture in walking pathways.   ICE to the affected hip every three hours for 30 minutes at a time and then as needed for pain and swelling.  Continue to use ice on the hip for pain and swelling from surgery. You may notice swelling that will progress down to the foot and ankle.  This is normal after surgery.  Elevate the leg when you are not up walking on it.    Continue to use the breathing machine which will help keep your temperature down.  It is common for your temperature to cycle up and down following surgery, especially at night when you are not up moving around and exerting yourself.  The breathing machine keeps your lungs expanded and your temperature down.   DIET You may resume your previous home diet once your are discharged from the hospital.  DRESSING / WOUND CARE / SHOWERING You may change your dressing every day with sterile gauze.  Please use good hand washing techniques before changing the dressing.  Do not use any lotions or creams on the incision until instructed by your surgeon. You may start showering once you are discharged home but do not submerge the incision under water. Just pat the incision dry and apply a dry gauze dressing on  daily. Change the surgical dressing daily and reapply a dry dressing each time.  ACTIVITY Walk with your walker as instructed. Use walker as long as suggested by your caregivers. Avoid periods of inactivity such as sitting longer than an hour when not asleep. This helps prevent blood clots.  You may resume a sexual relationship in one month or when given the OK by your doctor.  You may return to work once you are cleared by your doctor.  Do not drive a car for 6 weeks or until released by you surgeon.  Do not drive while taking narcotics.  WEIGHT BEARING Weight bearing as tolerated with assist device (walker, cane, etc) as directed, use it as long as suggested by your surgeon or therapist, typically at least 4-6 weeks.  POSTOPERATIVE CONSTIPATION PROTOCOL Constipation - defined medically as fewer than three stools per week and severe constipation as less than one stool per week.  One of the most common issues patients have following surgery is constipation.  Even if you have a regular bowel pattern at home, your normal regimen is likely to be disrupted due to multiple reasons following surgery.  Combination of anesthesia, postoperative narcotics, change in appetite and fluid intake all can affect your bowels.  In order to avoid complications following surgery, here are some recommendations in order to help you during your recovery period.  Colace (docusate) - Pick up an over-the-counter form of Colace or another stool softener and take twice a day as long as you are requiring postoperative pain  medications.  Take with a full glass of water daily.  If you experience loose stools or diarrhea, hold the colace until you stool forms back up.  If your symptoms do not get better within 1 week or if they get worse, check with your doctor.  Dulcolax (bisacodyl) - Pick up over-the-counter and take as directed by the product packaging as needed to assist with the movement of your bowels.  Take with a full  glass of water.  Use this product as needed if not relieved by Colace only.   MiraLax (polyethylene glycol) - Pick up over-the-counter to have on hand.  MiraLax is a solution that will increase the amount of water in your bowels to assist with bowel movements.  Take as directed and can mix with a glass of water, juice, soda, coffee, or tea.  Take if you go more than two days without a movement. Do not use MiraLax more than once per day. Call your doctor if you are still constipated or irregular after using this medication for 7 days in a row.  If you continue to have problems with postoperative constipation, please contact the office for further assistance and recommendations.  If you experience "the worst abdominal pain ever" or develop nausea or vomiting, please contact the office immediatly for further recommendations for treatment.  ITCHING  If you experience itching with your medications, try taking only a single pain pill, or even half a pain pill at a time.  You can also use Benadryl over the counter for itching or also to help with sleep.   TED HOSE STOCKINGS Wear the elastic stockings on both legs for three weeks following surgery during the day but you may remove then at night for sleeping.  MEDICATIONS See your medication summary on the "After Visit Summary" that the nursing staff will review with you prior to discharge.  You may have some home medications which will be placed on hold until you complete the course of blood thinner medication.  It is important for you to complete the blood thinner medication as prescribed by your surgeon.  Continue your approved medications as instructed at time of discharge.  PRECAUTIONS If you experience chest pain or shortness of breath - call 911 immediately for transfer to the hospital emergency department.  If you develop a fever greater that 101 F, purulent drainage from wound, increased redness or drainage from wound, foul odor from the  wound/dressing, or calf pain - CONTACT YOUR SURGEON.                                                   FOLLOW-UP APPOINTMENTS Make sure you keep all of your appointments after your operation with your surgeon and caregivers. You should call the office at the above phone number and make an appointment for approximately two weeks after the date of your surgery or on the date instructed by your surgeon outlined in the "After Visit Summary".  RANGE OF MOTION AND STRENGTHENING EXERCISES  These exercises are designed to help you keep full movement of your hip joint. Follow your caregiver's or physical therapist's instructions. Perform all exercises about fifteen times, three times per day or as directed. Exercise both hips, even if you have had only one joint replacement. These exercises can be done on a training (exercise) mat, on the floor, on  a table or on a bed. Use whatever works the best and is most comfortable for you. Use music or television while you are exercising so that the exercises are a pleasant break in your day. This will make your life better with the exercises acting as a break in routine you can look forward to.  Lying on your back, slowly slide your foot toward your buttocks, raising your knee up off the floor. Then slowly slide your foot back down until your leg is straight again.  Lying on your back spread your legs as far apart as you can without causing discomfort.  Lying on your side, raise your upper leg and foot straight up from the floor as far as is comfortable. Slowly lower the leg and repeat.  Lying on your back, tighten up the muscle in the front of your thigh (quadriceps muscles). You can do this by keeping your leg straight and trying to raise your heel off the floor. This helps strengthen the largest muscle supporting your knee.  Lying on your back, tighten up the muscles of your buttocks both with the legs straight and with the knee bent at a comfortable angle while keeping  your heel on the floor.   IF YOU ARE TRANSFERRED TO A SKILLED REHAB FACILITY If the patient is transferred to a skilled rehab facility following release from the hospital, a list of the current medications will be sent to the facility for the patient to continue.  When discharged from the skilled rehab facility, please have the facility set up the patient's [place] prior to being released. Also, the skilled facility will be responsible for providing the patient with their medications at time of release from the facility to include their pain medication, the muscle relaxants, and their blood thinner medication. If the patient is still at the rehab facility at time of the two week follow up appointment, the skilled rehab facility will also need to assist the patient in arranging follow up appointment in our office and any transportation needs.  MAKE SURE YOU:  Understand these instructions.  Get help right away if you are not doing well or get worse.    Pick up stool softner and laxative for home use following surgery while on pain medications. Do not submerge incision under water. Please use good hand washing techniques while changing dressing each day. May shower starting three days after surgery. Please use a clean towel to pat the incision dry following showers. Continue to use ice for pain and swelling after surgery. Do not use any lotions or creams on the incision until instructed by your surgeon.

## 2018-03-16 NOTE — Op Note (Signed)
OPERATIVE REPORT- TOTAL HIP ARTHROPLASTY   PREOPERATIVE DIAGNOSIS: Osteoarthritis of the Right hip.   POSTOPERATIVE DIAGNOSIS: Osteoarthritis of the Right  hip.   PROCEDURE: Right total hip arthroplasty, anterior approach.   SURGEON: Gaynelle Arabian, MD   ASSISTANT: Ardeen Jourdain, PA-C  ANESTHESIA:  Spinal  ESTIMATED BLOOD LOSS:-450 mL    DRAINS: Hemovac x1.   COMPLICATIONS: None   CONDITION: PACU - hemodynamically stable.   BRIEF CLINICAL NOTE: Samuel Chambers is a 67 y.o. male who has advanced end-  stage arthritis of their Right  hip with progressively worsening pain and  dysfunction.The patient has failed nonoperative management and presents for  total hip arthroplasty.   PROCEDURE IN DETAIL: After successful administration of spinal  anesthetic, the traction boots for the [facility] bed were placed on both  feet and the patient was placed onto the [facility] bed, boots placed into the leg  holders. The Right hip was then isolated from the perineum with plastic  drapes and prepped and draped in the usual sterile fashion. ASIS and  greater trochanter were marked and a oblique incision was made, starting  at about 1 cm lateral and 2 cm distal to the ASIS and coursing towards  the anterior cortex of the femur. The skin was cut with a 10 blade  through subcutaneous tissue to the level of the fascia overlying the  tensor fascia lata muscle. The fascia was then incised in line with the  incision at the junction of the anterior third and posterior 2/3rd. The  muscle was teased off the fascia and then the interval between the TFL  and the rectus was developed. The Hohmann retractor was then placed at  the top of the femoral neck over the capsule. The vessels overlying the  capsule were cauterized and the fat on top of the capsule was removed.  A Hohmann retractor was then placed anterior underneath the rectus  femoris to give exposure to the entire anterior capsule. A T-shaped   capsulotomy was performed. The edges were tagged and the femoral head  was identified.       Osteophytes are removed off the superior acetabulum.  The femoral neck was then cut in situ with an oscillating saw. Traction  was then applied to the left lower extremity utilizing the [facility]  traction. The femoral head was then removed. Retractors were placed  around the acetabulum and then circumferential removal of the labrum was  performed. Osteophytes were also removed. Reaming starts at 49 mm to  medialize and  Increased in 2 mm increments to 53 mm. We reamed in  approximately 40 degrees of abduction, 20 degrees anteversion. A 54 mm  pinnacle acetabular shell was then impacted in anatomic position under  fluoroscopic guidance with excellent purchase. We did not need to place  any additional dome screws. A 36 mm neutral + 4 marathon liner was then  placed into the acetabular shell.       The femoral lift was then placed along the lateral aspect of the femur  just distal to the vastus ridge. The leg was  externally rotated and capsule  was stripped off the inferior aspect of the femoral neck down to the  level of the lesser trochanter, this was done with electrocautery. The femur was lifted after this was performed. The  leg was then placed in an extended and adducted position essentially delivering the femur. We also removed the capsule superiorly and the piriformis from the piriformis  fossa to gain excellent exposure of the  proximal femur. Rongeur was used to remove some cancellous bone to get  into the lateral portion of the proximal femur for placement of the  initial starter reamer. The starter broaches was placed  the starter broach  and was shown to go down the center of the canal. Broaching  with the Actis system was then performed starting at size 0  coursing  Up to size 5. A size 5 had excellent torsional and rotational  and axial stability. The trial high offset neck was then placed   with a 36 + 1.5 trial head. The hip was then reduced. We confirmed that  the stem was in the canal both on AP and lateral x-rays. It also has excellent sizing. The hip was reduced with outstanding stability through full extension and full external rotation.. AP pelvis was taken and the leg lengths were measured and found to be equal. Hip was then dislocated again and the femoral head and neck removed. The  femoral broach was removed. Size 5 Actis stem with a high offset  neck was then impacted into the femur following native anteversion. Has  excellent purchase in the canal. Excellent torsional and rotational and  axial stability. It is confirmed to be in the canal on AP and lateral  fluoroscopic views. The 36 + 1.5 ceramic head was placed and the hip  reduced with outstanding stability. Again AP pelvis was taken and it  confirmed that the leg lengths were equal. The wound was then copiously  irrigated with saline solution and the capsule reattached and repaired  with Ethibond suture. 30 ml of .25% Bupivicaine was  injected into the capsule and into the edge of the tensor fascia lata as well as subcutaneous tissue. The fascia overlying the tensor fascia lata was then closed with a running #1 V-Loc. Subcu was closed with interrupted 2-0 Vicryl and subcuticular running 4-0 Monocryl. Incision was cleaned  and dried. Steri-Strips and a bulky sterile dressing applied. Hemovac  drain was hooked to suction and then the patient was awakened and transported to  recovery in stable condition.        Please note that a surgical assistant was a medical necessity for this procedure to perform it in a safe and expeditious manner. Assistant was necessary to provide appropriate retraction of vital neurovascular structures and to prevent femoral fracture and allow for anatomic placement of the prosthesis.  Gaynelle Arabian, M.D.

## 2018-03-17 ENCOUNTER — Encounter (HOSPITAL_COMMUNITY): Payer: Self-pay | Admitting: Orthopedic Surgery

## 2018-03-17 LAB — BASIC METABOLIC PANEL
Anion gap: 11 (ref 5–15)
BUN: 15 mg/dL (ref 8–23)
CALCIUM: 8.4 mg/dL — AB (ref 8.9–10.3)
CO2: 26 mmol/L (ref 22–32)
Chloride: 103 mmol/L (ref 98–111)
Creatinine, Ser: 0.99 mg/dL (ref 0.61–1.24)
GFR calc Af Amer: >60 mL/min (ref >60)
GLUCOSE: 185 mg/dL — AB (ref 70–99)
POTASSIUM: 3.8 mmol/L (ref 3.5–5.1)
Sodium: 140 mmol/L (ref 135–145)

## 2018-03-17 LAB — URINE CULTURE: Culture: NO GROWTH

## 2018-03-17 LAB — CBC
HEMATOCRIT: 32.5 % — AB (ref 39.0–52.0)
Hemoglobin: 11.1 g/dL — ABNORMAL LOW (ref 13.0–17.0)
MCH: 28.7 pg (ref 26.0–34.0)
MCHC: 34.2 g/dL (ref 30.0–36.0)
MCV: 84 fL (ref 78.0–100.0)
PLATELETS: 266 10*3/uL (ref 150–400)
RBC: 3.87 MIL/uL — ABNORMAL LOW (ref 4.22–5.81)
RDW: 13.9 % (ref 11.5–15.5)
WBC: 12.7 10*3/uL — ABNORMAL HIGH (ref 4.0–10.5)

## 2018-03-17 MED ORDER — METHOCARBAMOL 500 MG PO TABS: 500.0000 mg | ORAL_TABLET | Freq: Four times a day (QID) | ORAL | 0 refills | Status: DC | PRN

## 2018-03-17 MED ORDER — HYDROCODONE-ACETAMINOPHEN 5-325 MG PO TABS: 1.0000 | ORAL_TABLET | Freq: Four times a day (QID) | ORAL | 0 refills | Status: DC | PRN

## 2018-03-17 MED ORDER — ASPIRIN 325 MG PO TBEC: 325.0000 mg | DELAYED_RELEASE_TABLET | Freq: Every day | ORAL | 0 refills | Status: AC

## 2018-03-17 NOTE — Evaluation (Signed)
Physical Therapy Evaluation Patient Details Name: Samuel Chambers MRN: [id number] DOB: 02/02/1951 Today's Date: 03/17/2018   History of Present Illness  Patient is a 67 y/o male s/p RIGHT TOTAL HIP ARTHROPLASTY ANTERIOR APPROACH.  PMH positive for fibromyalgia, OA/DJD, CAD s/p stent, obesity and HTN.  Clinical Impression  Patient presents with decreased ambulation and strength/ROM following surgery, but is currently at S to minguard A level.  Feel safe for d/c home with wife and follow up PT.  No further acute level skilled PT needs.  Does need walker for home.     Follow Up Recommendations Follow surgeon's recommendation for DC plan and follow-up therapies    Equipment Recommendations  Rolling walker with 5" wheels    Recommendations for Other Services       Precautions / Restrictions Precautions Precautions: Fall Restrictions Weight Bearing Restrictions: No Other Position/Activity Restrictions: WBAT      Mobility  Bed Mobility Overal bed mobility: Needs Assistance Bed Mobility: Supine to Sit     Supine to sit: Min guard     General bed mobility comments: HOB flat and no rails  Transfers Overall transfer level: Needs assistance Equipment used: Rolling walker (2 wheeled) Transfers: Sit to/from Stand Sit to Stand: Supervision         General transfer comment: cues for hand placement; also practiced car transfer to simulated SUV height with minguard A  Ambulation/Gait Ambulation/Gait assistance: Supervision Gait Distance (Feet): 250 Feet Assistive device: Rolling walker (2 wheeled) Gait Pattern/deviations: Step-through pattern;Decreased stride length;Antalgic     General Gait Details: mild antalgia on R, but good step through pattern without cueing and working on posture independently  Stairs Stairs: Yes Stairs assistance: Min guard Stair Management: Two rails;Step to pattern;Forwards Number of Stairs: 4 General stair comments: cues for technique  initially, wife present and educated in Special educational needs teacher    Modified Rankin (Stroke Patients Only)       Balance Overall balance assessment: No apparent balance deficits (not formally assessed)                                           Pertinent Vitals/Pain Pain Assessment: Faces Faces Pain Scale: Hurts little more Pain Location: soreness in hip Pain Descriptors / Indicators: Discomfort Pain Intervention(s): Monitored during session;Repositioned;Ice applied    Home Living Family/patient expects to be discharged to:: Private residence Living Arrangements: Spouse/significant other Available Help at Discharge: Family Type of Home: House Home Access: Stairs to enter Entrance Stairs-Rails: Right;Left;Can reach both Technical brewer of Steps: 5 Home Layout: One level Home Equipment: Cane - single point      Prior Function Level of Independence: Independent with assistive device(s)         Comments: using cane prior to surgery     Hand Dominance   Dominant Hand: Right    Extremity/Trunk Assessment   Upper Extremity Assessment Upper Extremity Assessment: Overall WFL for tasks assessed    Lower Extremity Assessment Lower Extremity Assessment: RLE deficits/detail RLE Deficits / Details: AAROM grossly WFL with pain, strength at least 3/5 throughout       Communication   Communication: No difficulties  Cognition Arousal/Alertness: Awake/alert Behavior During Therapy: WFL for tasks assessed/performed Overall Cognitive Status: Within Functional Limits for tasks assessed  General Comments      Exercises Total Joint Exercises Ankle Circles/Pumps: AROM;10 reps;Both;Supine Quad Sets: AROM;10 reps;Both;Supine Short Arc Quad: AROM;10 reps;Right;Supine Heel Slides: AROM;AAROM;10 reps;Right;Supine Hip ABduction/ADduction: AROM;10 reps;Right;Supine    Assessment/Plan    PT Assessment All further PT needs can be met in the next venue of care  PT Problem List Decreased strength;Decreased mobility;Decreased knowledge of use of DME;Pain;Decreased safety awareness       PT Treatment Interventions      PT Goals (Current goals can be found in the Care Plan section)  Acute Rehab PT Goals Patient Stated Goal: to return to independent  PT Goal Formulation: All assessment and education complete, DC therapy    Frequency     Barriers to discharge        Co-evaluation               AM-PAC PT "6 Clicks" Daily Activity  Outcome Measure Difficulty turning over in bed (including adjusting bedclothes, sheets and blankets)?: A Lot Difficulty moving from lying on back to sitting on the side of the bed? : Unable Difficulty sitting down on and standing up from a chair with arms (e.g., wheelchair, bedside commode, etc,.)?: A Lot Help needed moving to and from a bed to chair (including a wheelchair)?: A Little Help needed walking in hospital room?: A Little Help needed climbing 3-5 steps with a railing? : A Little 6 Click Score: 14    End of Session Equipment Utilized During Treatment: Gait belt Activity Tolerance: Patient tolerated treatment well Patient left: with call bell/phone within reach;in chair;with family/visitor present   PT Visit Diagnosis: Other abnormalities of gait and mobility (R26.89);Pain Pain - part of body: Hip    Time: 0837-0909 PT Time Calculation (min) (ACUTE ONLY): 32 min   Charges:   PT Evaluation $PT Eval Low Complexity: 1 Low PT Treatments $Gait Training: 8-22 mins        [place], [place] [phone] 03/17/2018   Reginia Naas 03/17/2018, 9:49 AM

## 2018-03-17 NOTE — Plan of Care (Signed)
Plan of care discussed.   

## 2018-03-17 NOTE — Progress Notes (Signed)
   Subjective: 1 Day Post-Op Procedure(s) (LRB): RIGHT TOTAL HIP ARTHROPLASTY ANTERIOR APPROACH (Right) Patient reports pain as mild.   Patient seen in rounds by Dr. Wynelle Link. Patient is well, and has had no acute complaints or problems. States he is ready to go home. Foley catheter removed this AM. Denies chest pain or shortness of breath. We will start therapy today.   Objective: Vital signs in last 24 hours: Temp:  [97.5 F (36.4 C)-98.9 F (37.2 C)] 98.6 F (37 C) (08/20 7017) Pulse Rate:  [72-102] 89 (08/20 0633) Resp:  [14-21] 16 (08/20 0255) BP: (117-164)/(74-92) 134/77 (08/20 0633) SpO2:  [94 %-100 %] 94 % (08/20 7939) Weight:  [130.2 kg] 130.2 kg (08/19 1134)  Intake/Output from previous day:  Intake/Output Summary (Last 24 hours) at 03/17/2018 0708 Last data filed at 03/17/2018 0600 Gross per 24 hour  Intake 5724.35 ml  Output 1965 ml  Net 3759.35 ml     Labs: Recent Labs    03/17/18 0441  HGB 11.1*   Recent Labs    03/17/18 0441  WBC 12.7*  RBC 3.87*  HCT 32.5*  PLT 266   Recent Labs    03/17/18 0441  NA 140  K 3.8  CL 103  CO2 26  BUN 15  CREATININE 0.99  GLUCOSE 185*  CALCIUM 8.4*   Exam: General - Patient is Alert and Oriented Extremity - Neurologically intact Neurovascular intact Sensation intact distally Dorsiflexion/Plantar flexion intact Dressing - dressing C/D/I Motor Function - intact, moving foot and toes well on exam.   Past Medical History:  Diagnosis Date  . Adenomatous colon polyp 03/2005  . Anxiety   . CAD S/P percutaneous coronary angioplasty 2008   PCI to circumflex with a Promus DES 2.5 mm x 8 mm  . Degenerative joint disease   . Diverticulosis   . Fibromyalgia   . GERD (gastroesophageal reflux disease)   . Hyperlipidemia    statin intolerant  . Hypertension   . Internal hemorrhoids   . Obesity, Class II, BMI 35-39.9   . PONV (postoperative nausea and vomiting)     Assessment/Plan: 1 Day Post-Op Procedure(s)  (LRB): RIGHT TOTAL HIP ARTHROPLASTY ANTERIOR APPROACH (Right) Principal Problem:   OA (osteoarthritis) of hip  Estimated body mass index is 40.03 kg/m as calculated from the following:   Height as of this encounter: 5\' 11"  (1.803 m).   Weight as of this encounter: 130.2 kg. Advance diet Up with therapy D/C IV fluids  DVT Prophylaxis - Aspirin and Plavix Weight bearing as tolerated. D/C O2 and pulse ox and try on room air. Hemovac pulled without difficulty, will begin therapy.  Plan is to go Home after hospital stay. Plan for discharge today with home exercise program. Follow-up in the office in 2 weeks with Dr. Wynelle Link.  Theresa Duty, PA-C Orthopedic Surgery 03/17/2018, 7:08 AM

## 2018-03-17 NOTE — Discharge Summary (Signed)
Physician Discharge Summary   Patient ID: Samuel Chambers MRN: [id number] DOB/AGE: 1950/09/23 67 y.o.  Admit date: 03/16/2018 Discharge date: 03/17/2018  Primary Diagnosis: Osteoarthritis, right hip   Admission Diagnoses:  Past Medical History:  Diagnosis Date  . Adenomatous colon polyp 03/2005  . Anxiety   . CAD S/P percutaneous coronary angioplasty 2008   PCI to circumflex with a Promus DES 2.5 mm x 8 mm  . Degenerative joint disease   . Diverticulosis   . Fibromyalgia   . GERD (gastroesophageal reflux disease)   . Hyperlipidemia    statin intolerant  . Hypertension   . Internal hemorrhoids   . Obesity, Class II, BMI 35-39.9   . PONV (postoperative nausea and vomiting)    Discharge Diagnoses:   Principal Problem:   OA (osteoarthritis) of hip  Estimated body mass index is 40.03 kg/m as calculated from the following:   Height as of this encounter: _0  (1.803 m).   Weight as of this encounter: 130.2 kg.  Procedure(s) (LRB): RIGHT TOTAL HIP ARTHROPLASTY ANTERIOR APPROACH (Right)   Consults: None  HPI: Samuel Chambers is a 67 y.o. male who has advanced end-stage arthritis of their Right  hip with progressively worsening pain and dysfunction.The patient has failed nonoperative management and presents for total hip arthroplasty.   Laboratory Data: Admission on 03/16/2018, Discharged on 03/17/2018  Component Date Value Ref Range Status  . WBC 03/17/2018 12.7* 4.0 - 10.5 K/uL Final  . RBC 03/17/2018 3.87* 4.22 - 5.81 MIL/uL Final  . Hemoglobin 03/17/2018 11.1* 13.0 - 17.0 g/dL Final  . HCT 03/17/2018 32.5* 39.0 - 52.0 % Final  . MCV 03/17/2018 84.0  78.0 - 100.0 fL Final  . MCH 03/17/2018 28.7  26.0 - 34.0 pg Final  . MCHC 03/17/2018 34.2  30.0 - 36.0 g/dL Final  . RDW 03/17/2018 13.9  11.5 - 15.5 % Final  . Platelets 03/17/2018 266  150 - 400 K/uL Final   Performed at [facility], [place] [address] [place], [place] [address]  . Sodium  03/17/2018 140  135 - 145 mmol/L Final  . Potassium 03/17/2018 3.8  3.5 - 5.1 mmol/L Final  . Chloride 03/17/2018 103  98 - 111 mmol/L Final  . CO2 03/17/2018 26  22 - 32 mmol/L Final  . Glucose, Bld 03/17/2018 185* 70 - 99 mg/dL Final  . BUN 03/17/2018 15  8 - 23 mg/dL Final  . Creatinine, Ser 03/17/2018 0.99  0.61 - 1.24 mg/dL Final  . Calcium 03/17/2018 8.4* 8.9 - 10.3 mg/dL Final  . GFR calc non Af Amer 03/17/2018 >60  >60 mL/min Final  . GFR calc Af Amer 03/17/2018 >60  >60 mL/min Final   Comment: (NOTE) The eGFR has been calculated using the CKD EPI equation. This calculation has not been validated in all clinical situations. eGFR's persistently <60 mL/min signify possible Chronic Kidney Disease.   Georgiann Hahn gap 03/17/2018 11  5 - 15 Final   Performed at [facility], [place] [address], [place], [place] [address]  Office Visit on 03/14/2018  Component Date Value Ref Range Status  . Microalbumin, Urine 03/14/2018 16.9  Not Estab. ug/mL Final  . Color, UA 03/14/2018 yellow  yellow Final  . Clarity, UA 03/14/2018 clear  clear Final  . Glucose, UA 03/14/2018 negative  negative mg/dL Final  . Bilirubin, UA 03/14/2018 negative  negative Final  . Ketones, POC UA 03/14/2018 negative  negative mg/dL Final  . Roderic Scarce,  UA 03/14/2018 1.025  1.010 - 1.025 Final  . Blood, UA 03/14/2018 trace-lysed* negative Final  . pH, UA 03/14/2018 5.5  5.0 - 8.0 Final  . Protein Ur, POC 03/14/2018 =30* negative mg/dL Final  . Urobilinogen, UA 03/14/2018 0.2  0.2 or 1.0 E.U./dL Final  . Nitrite, UA 03/14/2018 Negative  Negative Final  . Leukocytes, UA 03/14/2018 Negative  Negative Final  . Hemoglobin A1C 03/14/2018 6.9* 4.0 - 5.6 % Final  Hospital Outpatient Visit on 03/09/2018  Component Date Value Ref Range Status  . aPTT 03/09/2018 28  24 - 36 seconds Final   Performed at [facility], [place] [address] [place], [place] [address]  . WBC 03/09/2018 10.2  4.0 - 10.5  K/uL Final  . RBC 03/09/2018 5.02  4.22 - 5.81 MIL/uL Final  . Hemoglobin 03/09/2018 14.6  13.0 - 17.0 g/dL Final  . HCT 03/09/2018 42.1  39.0 - 52.0 % Final  . MCV 03/09/2018 83.9  78.0 - 100.0 fL Final  . MCH 03/09/2018 29.1  26.0 - 34.0 pg Final  . MCHC 03/09/2018 34.7  30.0 - 36.0 g/dL Final  . RDW 03/09/2018 13.8  11.5 - 15.5 % Final  . Platelets 03/09/2018 261  150 - 400 K/uL Final   Performed at [facility], [place] [address], [place], [place] [address]  . Sodium 03/09/2018 141  135 - 145 mmol/L Final  . Potassium 03/09/2018 3.5  3.5 - 5.1 mmol/L Final  . Chloride 03/09/2018 100  98 - 111 mmol/L Final  . CO2 03/09/2018 29  22 - 32 mmol/L Final  . Glucose, Bld 03/09/2018 113* 70 - 99 mg/dL Final  . BUN 03/09/2018 20  8 - 23 mg/dL Final  . Creatinine, Ser 03/09/2018 1.13  0.61 - 1.24 mg/dL Final  . Calcium 03/09/2018 9.5  8.9 - 10.3 mg/dL Final  . Total Protein 03/09/2018 7.5  6.5 - 8.1 g/dL Final  . Albumin 03/09/2018 4.1  3.5 - 5.0 g/dL Final  . AST 03/09/2018 33  15 - 41 U/L Final  . ALT 03/09/2018 36  0 - 44 U/L Final  . Alkaline Phosphatase 03/09/2018 50  38 - 126 U/L Final  . Total Bilirubin 03/09/2018 0.5  0.3 - 1.2 mg/dL Final  . GFR calc non Af Amer 03/09/2018 >60  >60 mL/min Final  . GFR calc Af Amer 03/09/2018 >60  >60 mL/min Final   Comment: (NOTE) The eGFR has been calculated using the CKD EPI equation. This calculation has not been validated in all clinical situations. eGFR's persistently <60 mL/min signify possible Chronic Kidney Disease.   Georgiann Hahn gap 03/09/2018 12  5 - 15 Final   Performed at [facility], [place] [address], [place], [place] [address]  . Prothrombin Time 03/09/2018 12.5  11.4 - 15.2 seconds Final  . INR 03/09/2018 0.94   Final   Performed at [facility], [place] [address], [place], [place] [address]  . ABO/RH(D) 03/09/2018 B POS   Final  . Antibody Screen 03/09/2018 NEG   Final  . Sample  Expiration 03/09/2018 03/19/2018   Final  . Extend sample reason 03/09/2018    Final                   Value:NO TRANSFUSIONS OR PREGNANCY IN THE PAST 3 MONTHS Performed at [facility], [place] [address], [place], [place] [address]   . MRSA, PCR 03/09/2018 NEGATIVE  NEGATIVE Final  .  Staphylococcus aureus 03/09/2018 NEGATIVE  NEGATIVE Final   Comment: (NOTE) The Xpert SA Assay (FDA approved for NASAL specimens in patients 51 years of age and older), is one component of a comprehensive surveillance program. It is not intended to diagnose infection nor to guide or monitor treatment. Performed at [facility], [place] [address] [place], [place] [address]      X-Rays:Dg Pelvis Portable  Result Date: 03/16/2018 CLINICAL DATA:  Followup right hip replacement. EXAM: PORTABLE PELVIS 1-2 VIEWS COMPARISON:  Earlier same day FINDINGS: Revision of right hip arthroplasty. Components appear well positioned. Soft tissue drain in place. IMPRESSION: Revision of right hip arthroplasty. Electronically Signed   By: Nelson Chimes M.D.   On: 03/16/2018 16:39   Dg C-arm 1-60 Min-no Report  Result Date: 03/16/2018 Fluoroscopy was utilized by the requesting physician.  No radiographic interpretation.    EKG: Orders placed or performed in visit on 02/27/18  . EKG 12-Lead     Hospital Course: Patient was admitted to [facility] and taken to the OR and underwent the above state procedure without complications.  Patient tolerated the procedure well and was later transferred to the recovery room and then to the orthopaedic floor for postoperative care.  They were given PO and IV analgesics for pain control following their surgery.  They were given 24 hours of postoperative antibiotics of  Anti-infectives (From admission, onward)   Start     Dose/Rate Route Frequency Ordered Stop   03/16/18 2000  ceFAZolin (ANCEF) IVPB 2g/100 mL premix     2 g 200 mL/hr over 30 Minutes  Intravenous Every 6 hours 03/16/18 1720 03/17/18 0208   03/16/18 0600  ceFAZolin (ANCEF) 3 g in dextrose 5 % 50 mL IVPB     3 g 100 mL/hr over 30 Minutes Intravenous On call to O.R. 03/15/18 8309 03/16/18 1424     and started on DVT prophylaxis in the form of Aspirin and Plavix.   PT and OT were ordered for total hip protocol.  The patient was allowed to be WBAT with therapy. Discharge planning was consulted to help with postop disposition and equipment needs.  Patient had a good night on the evening of surgery. He started to get up OOB with therapy on POD #1. Hemovac drain was pulled without difficulty. Pt was seen during rounds and he was ready to go home. He worked with physical therapy that day and was meeting his goals. He was discharged to home later that day in stable condition.  Diet: Cardiac diet Activity:WBAT Follow-up: in 2 weeks with Dr. Wynelle Link Disposition - Home with home exercise program Discharged Condition: stable   Discharge Instructions    Call MD / Call 911   Complete by:  As directed    If you experience chest pain or shortness of breath, CALL 911 and be transported to the hospital emergency room.  If you develope a fever above 101 F, pus (white drainage) or increased drainage or redness at the wound, or calf pain, call your surgeon's office.   Change dressing   Complete by:  As directed    You may change your dressing on Wednesday (03/18/2018), then change the dressing daily with sterile 4 x 4 inch gauze dressing and paper tape.   Constipation Prevention   Complete by:  As directed    Drink plenty of fluids.  Prune juice may be helpful.  You may use a stool softener, such as Colace (over the counter) 100 mg  twice a day.  Use MiraLax (over the counter) for constipation as needed.   Diet - low sodium heart healthy   Complete by:  As directed    Discharge instructions   Complete by:  As directed    Dr. Gaynelle Arabian Total Joint Specialist [facility] [address]  [address], [place], [place] [address] [phone]  ANTERIOR APPROACH TOTAL HIP REPLACEMENT POSTOPERATIVE DIRECTIONS   Hip Rehabilitation, Guidelines Following Surgery  The results of a hip operation are greatly improved after range of motion and muscle strengthening exercises. Follow all safety measures which are given to protect your hip. If any of these exercises cause increased pain or swelling in your joint, decrease the amount until you are comfortable again. Then slowly increase the exercises. Call your caregiver if you have problems or questions.   HOME CARE INSTRUCTIONS  Remove items at home which could result in a fall. This includes throw rugs or furniture in walking pathways.  ICE to the affected hip every three hours for 30 minutes at a time and then as needed for pain and swelling.  Continue to use ice on the hip for pain and swelling from surgery. You may notice swelling that will progress down to the foot and ankle.  This is normal after surgery.  Elevate the leg when you are not up walking on it.   Continue to use the breathing machine which will help keep your temperature down.  It is common for your temperature to cycle up and down following surgery, especially at night when you are not up moving around and exerting yourself.  The breathing machine keeps your lungs expanded and your temperature down.  DIET You may resume your previous home diet once your are discharged from the hospital.  DRESSING / WOUND CARE / SHOWERING You may shower 3 days after surgery, but keep the wounds dry during showering.  You may use an occlusive plastic wrap (Press'n Seal for example), NO SOAKING/SUBMERGING IN THE BATHTUB.  If the bandage gets wet, change with a clean dry gauze.  If the incision gets wet, pat the wound dry with a clean towel. You may start showering once you are discharged home but do not submerge the incision under water. Just pat the incision dry and apply a dry gauze dressing  on daily. Change the surgical dressing daily and reapply a dry dressing each time.  ACTIVITY Walk with your walker as instructed. Use walker as long as suggested by your caregivers. Avoid periods of inactivity such as sitting longer than an hour when not asleep. This helps prevent blood clots.  You may resume a sexual relationship in one month or when given the OK by your doctor.  You may return to work once you are cleared by your doctor.  Do not drive a car for 6 weeks or until released by you surgeon.  Do not drive while taking narcotics.  WEIGHT BEARING Weight bearing as tolerated with assist device (walker, cane, etc) as directed, use it as long as suggested by your surgeon or therapist, typically at least 4-6 weeks.  POSTOPERATIVE CONSTIPATION PROTOCOL Constipation - defined medically as fewer than three stools per week and severe constipation as less than one stool per week.  One of the most common issues patients have following surgery is constipation.  Even if you have a regular bowel pattern at home, your normal regimen is likely to be disrupted due to multiple reasons following surgery.  Combination of anesthesia, postoperative narcotics,  change in appetite and fluid intake all can affect your bowels.  In order to avoid complications following surgery, here are some recommendations in order to help you during your recovery period.  Colace (docusate) - Pick up an over-the-counter form of Colace or another stool softener and take twice a day as long as you are requiring postoperative pain medications.  Take with a full glass of water daily.  If you experience loose stools or diarrhea, hold the colace until you stool forms back up.  If your symptoms do not get better within 1 week or if they get worse, check with your doctor.  Dulcolax (bisacodyl) - Pick up over-the-counter and take as directed by the product packaging as needed to assist with the movement of your bowels.  Take with a  full glass of water.  Use this product as needed if not relieved by Colace only.   MiraLax (polyethylene glycol) - Pick up over-the-counter to have on hand.  MiraLax is a solution that will increase the amount of water in your bowels to assist with bowel movements.  Take as directed and can mix with a glass of water, juice, soda, coffee, or tea.  Take if you go more than two days without a movement. Do not use MiraLax more than once per day. Call your doctor if you are still constipated or irregular after using this medication for 7 days in a row.  If you continue to have problems with postoperative constipation, please contact the office for further assistance and recommendations.  If you experience "the worst abdominal pain ever" or develop nausea or vomiting, please contact the office immediatly for further recommendations for treatment.  ITCHING  If you experience itching with your medications, try taking only a single pain pill, or even half a pain pill at a time.  You can also use Benadryl over the counter for itching or also to help with sleep.   TED HOSE STOCKINGS Wear the elastic stockings on both legs for three weeks following surgery during the day but you may remove then at night for sleeping.  MEDICATIONS See your medication summary on the "After Visit Summary" that the nursing staff will review with you prior to discharge.  You may have some home medications which will be placed on hold until you complete the course of blood thinner medication.  It is important for you to complete the blood thinner medication as prescribed by your surgeon.  Continue your approved medications as instructed at time of discharge.  PRECAUTIONS If you experience chest pain or shortness of breath - call 911 immediately for transfer to the hospital emergency department.  If you develop a fever greater that 101 F, purulent drainage from wound, increased redness or drainage from wound, foul odor from the  wound/dressing, or calf pain - CONTACT YOUR SURGEON.                                                   FOLLOW-UP APPOINTMENTS Make sure you keep all of your appointments after your operation with your surgeon and caregivers. You should call the office at the above phone number and make an appointment for approximately two weeks after the date of your surgery or on the date instructed by your surgeon outlined in the "After Visit Summary".  RANGE OF MOTION AND STRENGTHENING EXERCISES  These exercises are designed to help you keep full movement of your hip joint. Follow your caregiver's or physical therapist's instructions. Perform all exercises about fifteen times, three times per day or as directed. Exercise both hips, even if you have had only one joint replacement. These exercises can be done on a training (exercise) mat, on the floor, on a table or on a bed. Use whatever works the best and is most comfortable for you. Use music or television while you are exercising so that the exercises are a pleasant break in your day. This will make your life better with the exercises acting as a break in routine you can look forward to.  Lying on your back, slowly slide your foot toward your buttocks, raising your knee up off the floor. Then slowly slide your foot back down until your leg is straight again.  Lying on your back spread your legs as far apart as you can without causing discomfort.  Lying on your side, raise your upper leg and foot straight up from the floor as far as is comfortable. Slowly lower the leg and repeat.  Lying on your back, tighten up the muscle in the front of your thigh (quadriceps muscles). You can do this by keeping your leg straight and trying to raise your heel off the floor. This helps strengthen the largest muscle supporting your knee.  Lying on your back, tighten up the muscles of your buttocks both with the legs straight and with the knee bent at a comfortable angle while keeping  your heel on the floor.   IF YOU ARE TRANSFERRED TO A SKILLED REHAB FACILITY If the patient is transferred to a skilled rehab facility following release from the hospital, a list of the current medications will be sent to the facility for the patient to continue.  When discharged from the skilled rehab facility, please have the facility set up the patient's [place] prior to being released. Also, the skilled facility will be responsible for providing the patient with their medications at time of release from the facility to include their pain medication, the muscle relaxants, and their blood thinner medication. If the patient is still at the rehab facility at time of the two week follow up appointment, the skilled rehab facility will also need to assist the patient in arranging follow up appointment in our office and any transportation needs.  MAKE SURE YOU:  Understand these instructions.  Get help right away if you are not doing well or get worse.    Pick up stool softner and laxative for home use following surgery while on pain medications. Do not submerge incision under water. Please use good hand washing techniques while changing dressing each day. May shower starting three days after surgery. Please use a clean towel to pat the incision dry following showers. Continue to use ice for pain and swelling after surgery. Do not use any lotions or creams on the incision until instructed by your surgeon.   Do not sit on low chairs, stoools or toilet seats, as it may be difficult to get up from low surfaces   Complete by:  As directed    Driving restrictions   Complete by:  As directed    No driving for two weeks   TED hose   Complete by:  As directed    Use stockings (TED hose) for three weeks on both leg(s).  You may remove them at night for sleeping.   Weight bearing  as tolerated   Complete by:  As directed      Allergies as of 03/17/2018      Reactions   Gabapentin     Pt reports increased heart rate and blood pressure   Crestor [rosuvastatin] Other (See Comments)   myalgias   Oxycodone Other (See Comments)   Hallucinations-ok in small doses   Prednisone Other (See Comments)   Increased BP and HR in high doses   Valsartan Other (See Comments)   Made patient feel tired and no energy   Warfarin And Related Other (See Comments)   Fast heart beat, went down hill, felt like he was dying      Medication List    TAKE these medications   aspirin 325 MG EC tablet Take 1 tablet (325 mg total) by mouth daily with breakfast for 20 days.   clopidogrel 75 MG tablet Commonly known as:  PLAVIX TAKE 1 TABLET BY MOUTH EVERY DAY   DULoxetine 60 MG capsule Commonly known as:  CYMBALTA Take 1 capsule (60 mg total) by mouth daily.   furosemide 20 MG tablet Commonly known as:  LASIX Take 1 tablet (20 mg total) by mouth daily.   hydrochlorothiazide 25 MG tablet Commonly known as:  HYDRODIURIL TAKE 1 TABLET(25 MG) BY MOUTH DAILY   HYDROcodone-acetaminophen 5-325 MG tablet Commonly known as:  NORCO/VICODIN Take 1-2 tablets by mouth every 6 (six) hours as needed for moderate pain (pain score 4-6).   losartan 100 MG tablet Commonly known as:  COZAAR Take 1 tablet (100 mg total) by mouth daily. Please call to make appointment for further refills   methocarbamol 500 MG tablet Commonly known as:  ROBAXIN Take 1 tablet (500 mg total) by mouth every 6 (six) hours as needed for muscle spasms.   metoprolol succinate 50 MG 24 hr tablet Commonly known as:  TOPROL-XL Take 1 tablet (50 mg total) by mouth daily. Take with or immediately following a meal.   pantoprazole 40 MG tablet Commonly known as:  PROTONIX TAKE 1 TABLET BY MOUTH EVERY DAY 30-60 MINUTES BEFORE FIRST MEAL   sildenafil 20 MG tablet Commonly known as:  REVATIO TAKE 2 TABLETS BY MOUTH AS NEEDED            Durable Medical Equipment  (From admission, onward)         Start     Ordered     03/17/18 0910  For home use only DME Walker rolling  Once    Question:  Patient needs a walker to treat with the following condition  Answer:  Surgery, elective   03/17/18 0910           Discharge Care Instructions  (From admission, onward)         Start     Ordered   03/17/18 0000  Weight bearing as tolerated     03/17/18 0714   03/17/18 0000  Change dressing    Comments:  You may change your dressing on Wednesday (03/18/2018), then change the dressing daily with sterile 4 x 4 inch gauze dressing and paper tape.   03/17/18 0714         Follow-up Information    Gaynelle Arabian, MD. Schedule an appointment as soon as possible for a visit on 03/31/2018.   Specialty:  Orthopedic Surgery Contact information: [address] [place] [address] [phone]           Signed: Theresa Duty, PA-C Orthopaedic Surgery 03/17/2018, 1:29 PM

## 2018-03-21 ENCOUNTER — Other Ambulatory Visit: Payer: Self-pay | Admitting: Cardiovascular Disease

## 2018-03-22 ENCOUNTER — Other Ambulatory Visit: Payer: Self-pay | Admitting: Cardiovascular Disease

## 2018-03-25 ENCOUNTER — Encounter: Payer: Self-pay | Admitting: Physician Assistant

## 2018-04-01 ENCOUNTER — Encounter: Payer: Self-pay | Admitting: Physician Assistant

## 2018-04-19 ENCOUNTER — Other Ambulatory Visit: Payer: Self-pay | Admitting: Cardiovascular Disease

## 2018-04-21 DIAGNOSIS — Z96641 Presence of right artificial hip joint: Secondary | ICD-10-CM | POA: Diagnosis not present

## 2018-04-21 DIAGNOSIS — Z471 Aftercare following joint replacement surgery: Secondary | ICD-10-CM | POA: Diagnosis not present

## 2018-04-22 ENCOUNTER — Ambulatory Visit: Payer: Medicare Other | Admitting: Physician Assistant

## 2018-04-29 ENCOUNTER — Encounter: Payer: Self-pay | Admitting: Physician Assistant

## 2018-05-09 DIAGNOSIS — M1611 Unilateral primary osteoarthritis, right hip: Secondary | ICD-10-CM | POA: Diagnosis not present

## 2018-05-09 DIAGNOSIS — M1711 Unilateral primary osteoarthritis, right knee: Secondary | ICD-10-CM | POA: Diagnosis not present

## 2018-05-14 ENCOUNTER — Ambulatory Visit (INDEPENDENT_AMBULATORY_CARE_PROVIDER_SITE_OTHER): Payer: Medicare Other | Admitting: Physician Assistant

## 2018-05-14 VITALS — BP 150/82 | HR 98 | Temp 98.0°F | Resp 16 | Ht 71.0 in | Wt 264.0 lb

## 2018-05-14 DIAGNOSIS — K219 Gastro-esophageal reflux disease without esophagitis: Secondary | ICD-10-CM | POA: Diagnosis not present

## 2018-05-14 DIAGNOSIS — Z13220 Encounter for screening for lipoid disorders: Secondary | ICD-10-CM | POA: Diagnosis not present

## 2018-05-14 DIAGNOSIS — R7309 Other abnormal glucose: Secondary | ICD-10-CM | POA: Diagnosis not present

## 2018-05-14 DIAGNOSIS — Z23 Encounter for immunization: Secondary | ICD-10-CM | POA: Diagnosis not present

## 2018-05-14 DIAGNOSIS — F411 Generalized anxiety disorder: Secondary | ICD-10-CM | POA: Diagnosis not present

## 2018-05-14 LAB — POCT GLYCOSYLATED HEMOGLOBIN (HGB A1C): Hemoglobin A1C: 6.1 % — AB (ref 4.0–5.6)

## 2018-05-14 MED ORDER — DULOXETINE HCL 60 MG PO CPEP: 60.0000 mg | ORAL_CAPSULE | Freq: Every day | ORAL | 3 refills | Status: DC

## 2018-05-14 MED ORDER — PANTOPRAZOLE SODIUM 40 MG PO TBEC: DELAYED_RELEASE_TABLET | ORAL | 3 refills | Status: DC

## 2018-05-14 NOTE — Patient Instructions (Addendum)
See if you can go without pantoprazole.  See below for flax seed info about cholesterol lowering.   Your A1C today is 6.1%!!! Samuel Chambers! This is in the prediabetes range.    Hemoglobin A1c Test Some of the sugar (glucose) that circulates in your blood sticks or binds to blood proteins. Hemoglobin (Hb or Hgb) is one type of blood protein that glucose binds to. It also carries oxygen in the red blood cells (RBCs). When glucose binds to Hb, the glucose-coated Hb is called glycated Hb. Once Hb is glycated, it remains that way for the life of the RBC. This is about 120 days. Rather than testing your blood glucose level on one single day, the hemoglobin A1c (HbA1c) test measures the average amount of glycated hemoglobin and, therefore, the average amount of glucose in your blood during the 3-4 months just before the test is done. The HbA1c test is used to monitor long-term control of blood sugar in people who have diabetes mellitus. The HbA1c test can also be used in addition to or in combination with fasting blood glucose level and oral glucose tolerance tests. What do the results mean? It is your responsibility to obtain your test results. Ask the lab or department performing the test when and how you will get your results. Contact your health care provider to discuss any questions you have about your results. Range of Normal Values Ranges for normal values may vary among different labs and hospitals. You should always check with your health care provider after having lab work or other tests done to discuss the meaning of your test results and whether your values are considered within normal limits. The ranges for normal HbA1c test results are as follows:  Adult without diabetes: 4-5.9%.  Adult with diabetes and good blood glucose control: less than 6.5%.  Several factors can affect HbA1c test results. These may include:  Diseases (hemoglobinopathies) that cause a change in the shape, size, or amount of  Hb in your blood.  Longer than normal RBC life span.  Abnormally low levels of certain proteins in your blood.  Eating foods or taking supplements that are high in vitamin C (ascorbic acid).  Meaning of Results Outside Normal Value Ranges Abnormally high HbA1c values are most commonly an indication of prediabetes mellitus and diabetes mellitus:  An HbA1c result of 5.7-6.4% is considered diagnostic of prediabetes mellitus.  An HbA1c result of 6.5% or higher on two separate occasions is considered diagnostic of diabetes mellitus.  Abnormally low HbA1c values can be caused by several health conditions. These may include:  Pregnancy.  A large amount of blood loss.  Blood transfusions.  Low red blood cell count (anemia). This is caused by premature destruction of red blood cells.  Long-term kidney failure.  Some unusual forms of Hb (Hb variants), such as sickle cell trait.  Discuss your test results with your health care provider. He or she will use the results to make a diagnosis and determine a treatment plan that is right for you. Talk with your health care provider to discuss your results, treatment options, and if necessary, the need for more tests. Talk with your health care provider if you have any questions about your results. This information is not intended to replace advice given to you by your health care provider. Make sure you discuss any questions you have with your health care provider. Document Released: 08/06/2004 Document Revised: 04/10/2016 Document Reviewed: 11/29/2013 Elsevier Interactive Patient Education  2018 [employer].  If you have lab work done today you will be contacted with your lab results within the next 2 weeks.  If you have not heard from [place] then please contact us. The fastest way to get your results is to register for My Chart. ----------------------------------------------------------------------------------------- (The American heart  Association is an excellent resource for lowering cholesterol. Please check out their website) Lowering your cholesterol levels will lower your risk of a cardiac event.   Populations with high intakes of omega-3 (n-3) polyunsaturated fatty acids (such as the [place]) have low rates of heart disease I recommend a fish oil/omega-3 supplement daily as well as focusing on low cholesterol diet and exercise. It is important to take an omega-3/fish oil supplement that is highest in the [place] and EPA types of omega-3 so look for those under the active ingredients and pick the one with the most during the next time you purchase supplements. Sometimes the the store brands will actually end up being the best for the least cost. Do not pay attention to the total mg listed on the front of the bottle.    Start taking flaxseed (crushed seeds or oil) daily or red yeast rice.   Daily consumption of almonds (42 g/day or about half a cup or 1.5 ounces), walnuts, pistachios alone or in combination with dark chocolate had favorable effects on total cholesterol  Fiber-rich foods, such as fruits, vegetables, beans, and oats, seem to lower cholesterol and are generally good for your health.    A Mediterranean diet appears to reduce the risk of cardiovascular events. There is no single Mediterranean diet, but such diets are typically high in fruits, vegetables, whole grains, beans, nuts, and seeds and include olive oil as an important source of fat; there are typically low to moderate amounts of fish, poultry, and dairy products, and there is little red meat.

## 2018-05-14 NOTE — Progress Notes (Signed)
Samuel Chambers  MRN: [id number] DOB: Sep 04, 1950  PCP: Dorise Hiss, PA-C  Subjective:  Pt is a pleasant 67 year old male who presents to clinic for f/u diabetes. He is feeling well today. Recent right hip replacement.  He was here for presurgical clearance on 8/17 and had an A1C of 6.9%. "You scared me about the diabetes thing". Metformin was not initiated as he opted for diet and exercise. He is making some wonderful lifestyle changes and has lost 20lbs since that OV.   Home sugars are 100-140 Eating 1,300 cal/day Drinking water  Wt Readings from Last 3 Encounters:  05/14/18 264 lb (119.7 kg)  03/16/18 287 lb 0.1 oz (130.2 kg)  03/14/18 287 lb (130.2 kg)   Lab Results  Component Value Date   CHOL 180 03/14/2017   CHOL 180 09/02/2016   CHOL 177 09/21/2015   Lab Results  Component Value Date   HDL 34 (L) 03/14/2017   HDL 35 (L) 09/02/2016   HDL 36 (L) 09/21/2015   Lab Results  Component Value Date   LDLCALC 103 (H) 03/14/2017   LDLCALC 102 (H) 09/02/2016   LDLCALC 103 09/21/2015   Lab Results  Component Value Date   TRIG 214 (H) 03/14/2017   TRIG 215 (H) 09/02/2016   TRIG 190 (H) 09/21/2015   Lab Results  Component Value Date   CHOLHDL 5.3 (H) 03/14/2017   CHOLHDL 5.1 (H) 09/02/2016   CHOLHDL 4.9 09/21/2015   No results found for: LDLDIRECT  Review of Systems  Gastrointestinal: Negative for abdominal pain, nausea and vomiting.  Endocrine: Negative for polydipsia, polyphagia and polyuria.  Musculoskeletal: Negative for arthralgias and back pain.    Patient Active Problem List   Diagnosis Date Noted  . OA (osteoarthritis) of hip 03/16/2018  . Elevated hemoglobin A1c 03/14/2018  . Chest pain with moderate risk for cardiac etiology 11/15/2015  . Upper airway cough syndrome 06/06/2015  . Severe obesity (BMI 35.0-35.9 with comorbidity) ([place]) 08/04/2014  . Hyperlipidemia with target LDL less than 70; statin intolerant 04/29/2013  . CAD S/P  percutaneous coronary angioplasty   . S/P laparoscopic cholecystectomy July 2014 02/18/2013  . Gallstones 01/28/2013  . OA (osteoarthritis) of knee 09/04/2012  . Acute medial meniscal tear 03/27/2012  . Anxiety state 02/24/2007  . Essential hypertension 02/24/2007  . GERD 02/24/2007  . DEGENERATIVE JOINT DISEASE, GENERALIZED 02/24/2007    Current Outpatient Medications on File Prior to Visit  Medication Sig Dispense Refill  . clopidogrel (PLAVIX) 75 MG tablet TAKE 1 TABLET BY MOUTH EVERY DAY 90 tablet 0  . clopidogrel (PLAVIX) 75 MG tablet TAKE 1 TABLET(75 MG) BY MOUTH DAILY 90 tablet 1  . DULoxetine (CYMBALTA) 60 MG capsule Take 1 capsule (60 mg total) by mouth daily. 90 capsule 3  . furosemide (LASIX) 20 MG tablet Take 1 tablet (20 mg total) by mouth daily. 90 tablet 3  . hydrochlorothiazide (HYDRODIURIL) 25 MG tablet TAKE 1 TABLET(25 MG) BY MOUTH DAILY 90 tablet 0  . HYDROcodone-acetaminophen (NORCO/VICODIN) 5-325 MG tablet Take 1-2 tablets by mouth every 6 (six) hours as needed for moderate pain (pain score 4-6). 56 tablet 0  . losartan (COZAAR) 100 MG tablet Take 1 tablet (100 mg total) by mouth daily. Please call to make appointment for further refills 30 tablet 0  . losartan (COZAAR) 100 MG tablet TAKE 1 TABLET BY MOUTH DAILY 90 tablet 0  . methocarbamol (ROBAXIN) 500 MG tablet Take 1 tablet (500 mg total) by mouth every  6 (six) hours as needed for muscle spasms. 40 tablet 0  . metoprolol succinate (TOPROL-XL) 50 MG 24 hr tablet TAKE 1 TABLET BY MOUTH DAILY, TAKE WITH OR IMMEDIATELY FOLLOWING A MEAL 90 tablet 1  . pantoprazole (PROTONIX) 40 MG tablet TAKE 1 TABLET BY MOUTH EVERY DAY 30-60 MINUTES BEFORE FIRST MEAL 90 tablet 4  . sildenafil (REVATIO) 20 MG tablet TAKE 2 TABLETS BY MOUTH AS NEEDED  11   No current facility-administered medications on file prior to visit.     Allergies  Allergen Reactions  . Gabapentin     Pt reports increased heart rate and blood pressure  .  Crestor [Rosuvastatin] Other (See Comments)    myalgias  . Oxycodone Other (See Comments)    Hallucinations-ok in small doses  . Prednisone Other (See Comments)    Increased BP and HR in high doses  . Valsartan Other (See Comments)    Made patient feel tired and no energy  . Warfarin And Related Other (See Comments)    Fast heart beat, went down hill, felt like he was dying     Objective:  BP (!) 150/82   Pulse 98   Temp 98 F (36.7 C) (Oral)   Resp 16   Ht 5\' 11"  (1.803 m)   Wt 264 lb (119.7 kg)   SpO2 100%   BMI 36.82 kg/m   Physical Exam  Constitutional: He is oriented to person, place, and time. He appears well-developed and well-nourished.  Cardiovascular: Normal rate and regular rhythm.  Neurological: He is alert and oriented to person, place, and time.  Skin: Skin is warm and dry.  Psychiatric: He has a normal mood and affect. His behavior is normal. Judgment and thought content normal.  Vitals reviewed.   Results for orders placed or performed in visit on 05/14/18  POCT glycosylated hemoglobin (Hb A1C)  Result Value Ref Range   Hemoglobin A1C 6.1 (A) 4.0 - 5.6 %   HbA1c POC (<> result, manual entry)     HbA1c, POC (prediabetic range)     HbA1c, POC (controlled diabetic range)     Assessment and Plan :  1. Elevated hemoglobin A1c - Pt here for f/u diabetes. [name] was here  8/17 and had an A1C of 6.9% - opted for lifestyle changes. He's lost 20lbs in the past 2 months with dietary changes and is feeling great. A1C today is 6.1. RTC in 6 months to recheck.  - POCT glycosylated hemoglobin (Hb A1C) - Microalbumin, urine  2. Need for influenza vaccination - Flu vaccine HIGH DOSE PF   Mercer Pod, PA-C  [facility] [place] 05/14/2018 10:23 AM  Please note: Portions of this report may have been transcribed using dragon voice recognition software. Every effort was made to ensure accuracy; however, inadvertent computerized  transcription errors may be present.

## 2018-05-15 LAB — MICROALBUMIN, URINE: Microalbumin, Urine: 7.7 ug/mL

## 2018-05-21 ENCOUNTER — Encounter: Payer: Self-pay | Admitting: Physician Assistant

## 2018-06-02 DIAGNOSIS — M1611 Unilateral primary osteoarthritis, right hip: Secondary | ICD-10-CM | POA: Diagnosis not present

## 2018-06-02 DIAGNOSIS — M1711 Unilateral primary osteoarthritis, right knee: Secondary | ICD-10-CM | POA: Diagnosis not present

## 2018-06-12 ENCOUNTER — Encounter: Payer: Self-pay | Admitting: Cardiovascular Disease

## 2018-06-12 ENCOUNTER — Other Ambulatory Visit: Payer: Self-pay | Admitting: Cardiovascular Disease

## 2018-06-12 ENCOUNTER — Ambulatory Visit (INDEPENDENT_AMBULATORY_CARE_PROVIDER_SITE_OTHER): Payer: Medicare Other | Admitting: Cardiovascular Disease

## 2018-06-12 VITALS — BP 120/78 | HR 86 | Ht 72.0 in | Wt 259.8 lb

## 2018-06-12 DIAGNOSIS — Z79899 Other long term (current) drug therapy: Secondary | ICD-10-CM

## 2018-06-12 DIAGNOSIS — Z9861 Coronary angioplasty status: Secondary | ICD-10-CM | POA: Diagnosis not present

## 2018-06-12 DIAGNOSIS — E785 Hyperlipidemia, unspecified: Secondary | ICD-10-CM | POA: Diagnosis not present

## 2018-06-12 DIAGNOSIS — I2510 Atherosclerotic heart disease of native coronary artery without angina pectoris: Secondary | ICD-10-CM

## 2018-06-12 DIAGNOSIS — E668 Other obesity: Secondary | ICD-10-CM | POA: Diagnosis not present

## 2018-06-12 LAB — COMPREHENSIVE METABOLIC PANEL
ALBUMIN: 4.2 g/dL (ref 3.6–4.8)
ALK PHOS: 72 IU/L (ref 39–117)
ALT: 21 IU/L (ref 0–44)
AST: 19 IU/L (ref 0–40)
Albumin/Globulin Ratio: 1.8 (ref 1.2–2.2)
BUN/Creatinine Ratio: 10 (ref 10–24)
BUN: 12 mg/dL (ref 8–27)
Bilirubin Total: 0.4 mg/dL (ref 0.0–1.2)
CALCIUM: 9.7 mg/dL (ref 8.6–10.2)
CO2: 22 mmol/L (ref 20–29)
CREATININE: 1.15 mg/dL (ref 0.76–1.27)
Chloride: 102 mmol/L (ref 96–106)
GFR calc Af Amer: 76 mL/min/{1.73_m2} (ref >59)
GFR, EST NON AFRICAN AMERICAN: 65 mL/min/{1.73_m2} (ref >59)
GLOBULIN, TOTAL: 2.4 g/dL (ref 1.5–4.5)
GLUCOSE: 97 mg/dL (ref 65–99)
Potassium: 3.9 mmol/L (ref 3.5–5.2)
Sodium: 140 mmol/L (ref 134–144)
Total Protein: 6.6 g/dL (ref 6.0–8.5)

## 2018-06-12 LAB — LIPID PANEL
CHOL/HDL RATIO: 5 ratio (ref 0.0–5.0)
CHOLESTEROL TOTAL: 195 mg/dL (ref 100–199)
HDL: 39 mg/dL — ABNORMAL LOW (ref >39)
LDL CALC: 131 mg/dL — AB (ref 0–99)
TRIGLYCERIDES: 124 mg/dL (ref 0–149)
VLDL CHOLESTEROL CAL: 25 mg/dL (ref 5–40)

## 2018-06-12 MED ORDER — HYDROCHLOROTHIAZIDE 25 MG PO TABS: 12.5000 mg | ORAL_TABLET | Freq: Every day | ORAL | 3 refills | Status: DC

## 2018-06-12 NOTE — Progress Notes (Signed)
Patient ID: Samuel Chambers, male   DOB: Nov 19, 1950, 67 y.o.   MRN: [id number]     Cardiology Office Note    Date:  06/12/2018   ID:  Samuel Chambers, DOB [birth year], MRN [id number]  PCP:  Dorise Hiss, PA-C  Cardiologist:   Sanda Klein, MD  chief complaint: CAD Hypertension and hypercholesterolemia   History of Present Illness:  Samuel Chambers is a 67 y.o. male with a long-standing history of coronary artery disease status post remote drug-eluting stent to the left circumflex coronary artery in 2008, without subsequent coronary events.  He has hypertension, severe obesity and a history of statin intolerance.  Samuel Chambers has made huge improvements to his lifestyle and health.  After undergoing his hip surgery and being inactive he gained weight and his hemoglobin A1c increased to 6.9%.  This scared him.  He is diligently sticking to a 1300-calorie / 24-hour diet using a mobile phone app.  He's lost about 30 pounds in just 3 months.  He is down to a 40 inch waist.  His hemoglobin A1c has improved to 6.1%.  He remains moderately obese with a BMI of 35.  His blood pressure is excellent, whereas before it has always been a challenge to treat.  He would like to stop his loop diuretic.  The patient specifically denies any chest pain at rest exertion, dyspnea at rest or with exertion, orthopnea, paroxysmal nocturnal dyspnea, syncope, palpitations, focal neurological deficits, intermittent claudication, lower extremity edema, unexplained weight gain, cough, hemoptysis or wheezing.  He has tried 3 different statins (atorvastatin, simvastatin, rosuvastatin) as well as ezetimibe, all of which cause muscle aches. Most recent LDL cholesterol was borderline acceptable at 102. He has mild hypertriglyceridemia, low HDL cholesterol and very prominent ventral adiposity.  Past Medical History:  Diagnosis Date  . Adenomatous colon polyp 03/2005  . Anxiety   . CAD S/P percutaneous coronary  angioplasty 2008   PCI to circumflex with a Promus DES 2.5 mm x 8 mm  . Degenerative joint disease   . Diverticulosis   . Fibromyalgia   . GERD (gastroesophageal reflux disease)   . Hyperlipidemia    statin intolerant  . Hypertension   . Internal hemorrhoids   . Obesity, Class II, BMI 35-39.9   . PONV (postoperative nausea and vomiting)     Past Surgical History:  Procedure Laterality Date  . CHOLECYSTECTOMY N/A 02/18/2013   Procedure: LAPAROSCOPIC CHOLECYSTECTOMY WITH INTRAOPERATIVE CHOLANGIOGRAM;  Surgeon: Pedro Earls, MD;  Location: [facility];  Service: General;  Laterality: N/A;  . COLONOSCOPY    . CORONARY ANGIOPLASTY WITH STENT PLACEMENT  04/30/2007   2.5 mm Promus stent that was to 95% Circ;had 60-70% lesion to RCA  . DOPPLER ECHOCARDIOGRAPHY  05/27/2002   [facility].-normal EF 55-66%,  . HEEL SPUR SURGERY    . HEMORRHOID SURGERY    . hip surgery  about 10 to 12 years ago   complete  . Holter Monitor  04/07/2007   sinus tachy.;  . KNEE ARTHROSCOPY  03/27/2012   Procedure: ARTHROSCOPY KNEE;  Surgeon: Gearlean Alf, MD;  Location: [facility];  Service: Orthopedics;  Laterality: Left;  . NM MYOCAR PERF WALL MOTION  12/19/2011   EXERCISED FOR 8-1/2 MINUTES RECHING 10 METABOLIC EQUIVALENTS. NO EVIDENCE  OF ISCHEMIA OR INFARCTION . EF 71%  . renal dopplers  04/08/2007   relatively normal  . TOTAL HIP ARTHROPLASTY Right 03/16/2018   Procedure: RIGHT TOTAL HIP ARTHROPLASTY ANTERIOR APPROACH;  Surgeon: Wynelle Link,  Pilar Plate, MD;  Location: [facility];  Service: Orthopedics;  Laterality: Right;  . TOTAL KNEE ARTHROPLASTY Left 09/04/2012   Procedure: TOTAL KNEE ARTHROPLASTY;  Surgeon: Gearlean Alf, MD;  Location: [facility];  Service: Orthopedics;  Laterality: Left;    Current Medications: Outpatient Medications Prior to Visit  Medication Sig Dispense Refill  . clopidogrel (PLAVIX) 75 MG tablet TAKE 1 TABLET BY MOUTH EVERY DAY 90 tablet 0  . DULoxetine (CYMBALTA) 60 MG capsule Take 1 capsule  (60 mg total) by mouth daily. 90 capsule 3  . losartan (COZAAR) 100 MG tablet Take 1 tablet (100 mg total) by mouth daily. Please call to make appointment for further refills 30 tablet 0  . metoprolol succinate (TOPROL-XL) 50 MG 24 hr tablet TAKE 1 TABLET BY MOUTH DAILY, TAKE WITH OR IMMEDIATELY FOLLOWING A MEAL 90 tablet 1  . sildenafil (REVATIO) 20 MG tablet TAKE 2 TABLETS BY MOUTH AS NEEDED  11  . furosemide (LASIX) 20 MG tablet Take 1 tablet (20 mg total) by mouth daily. 90 tablet 3  . hydrochlorothiazide (HYDRODIURIL) 25 MG tablet TAKE 1 TABLET(25 MG) BY MOUTH DAILY 90 tablet 0  . HYDROcodone-acetaminophen (NORCO/VICODIN) 5-325 MG tablet Take 1-2 tablets by mouth every 6 (six) hours as needed for moderate pain (pain score 4-6). 56 tablet 0  . methocarbamol (ROBAXIN) 500 MG tablet Take 1 tablet (500 mg total) by mouth every 6 (six) hours as needed for muscle spasms. 40 tablet 0  . pantoprazole (PROTONIX) 40 MG tablet TAKE 1 TABLET BY MOUTH EVERY DAY 30-60 MINUTES BEFORE FIRST MEAL 90 tablet 3   No facility-administered medications prior to visit.      Allergies:   Gabapentin; Crestor [rosuvastatin]; Oxycodone; Prednisone; Valsartan; and Warfarin and related   Social History   Socioeconomic History  . Marital status: Married    Spouse name: Not on file  . Number of children: 1  . Years of education: Not on file  . Highest education level: Not on file  Occupational History    Employer: [employer]  . Financial resource strain: Not hard at all  . Food insecurity:    Worry: Never true    Inability: Never true  . Transportation needs:    Medical: No    Non-medical: No  Tobacco Use  . Smoking status: Never Smoker  . Smokeless tobacco: Never Used  Substance and Sexual Activity  . Alcohol use: No  . Drug use: No  . Sexual activity: Not on file  Lifestyle  . Physical activity:    Days per week: 0 days    Minutes per session: 0 min  . Stress: To some extent    Relationships  . Social connections:    Talks on phone: More than three times a week    Gets together: More than three times a week    Attends religious service: More than 4 times per year    Active member of club or organization: No    Attends meetings of clubs or organizations: Never    Relationship status: Married  Other Topics Concern  . Not on file  Social History Narrative   Very little 0-2 drinks a week     Family History:  The patient's family history includes Breast cancer in his mother; Diabetes in his mother; Prostate cancer in his father.   ROS:   Please see the history of present illness.    ROS all other systems are reviewed and are negative  PHYSICAL EXAM:   VS:  BP 120/78   Pulse 86   Ht 6' (1.829 m)   Wt 259 lb 12.8 oz (117.8 kg)   BMI 35.24 kg/m     General: Alert, oriented x3, no distress, moderately obese Head: no evidence of trauma, PERRL, EOMI, no exophtalmos or lid lag, no myxedema, no xanthelasma; normal ears, nose and oropharynx Neck: normal jugular venous pulsations and no hepatojugular reflux; brisk carotid pulses without delay and no carotid bruits Chest: clear to auscultation, no signs of consolidation by percussion or palpation, normal fremitus, symmetrical and full respiratory excursions Cardiovascular: normal position and quality of the apical impulse, regular rhythm, normal first and second heart sounds, no murmurs, rubs or gallops Abdomen: no tenderness or distention, no masses by palpation, no abnormal pulsatility or arterial bruits, normal bowel sounds, no hepatosplenomegaly Extremities: no clubbing, cyanosis or edema; 2+ radial, ulnar and brachial pulses bilaterally; 2+ right femoral, posterior tibial and dorsalis pedis pulses; 2+ left femoral, posterior tibial and dorsalis pedis pulses; no subclavian or femoral bruits Neurological: grossly nonfocal Psych: Normal mood and affect   Wt Readings from Last 3 Encounters:  06/12/18 259 lb  12.8 oz (117.8 kg)  05/14/18 264 lb (119.7 kg)  03/16/18 287 lb 0.1 oz (130.2 kg)      Studies/Labs Reviewed:   EKG:  EKG is not ordered today. Recent Labs: 10/14/2017: TSH 3.470 03/09/2018: ALT 36 03/17/2018: BUN 15; Creatinine, Ser 0.99; Hemoglobin 11.1; Platelets 266; Potassium 3.8; Sodium 140   Lipid Panel    Component Value Date/Time   CHOL 180 03/14/2017 0845   TRIG 214 (H) 03/14/2017 0845   HDL 34 (L) 03/14/2017 0845   CHOLHDL 5.3 (H) 03/14/2017 0845   CHOLHDL 4.9 09/21/2015 1410   VLDL 38 (H) 09/21/2015 1410   LDLCALC 103 (H) 03/14/2017 0845      ASSESSMENT:    1. Hyperlipidemia with target LDL less than 70; statin intolerant   2. Coronary artery disease involving native coronary artery of native heart without angina pectoris   3. Medication management   4. Moderate obesity      PLAN:  In order of problems listed above:  1. HTN: He has lost substantial weight and I think he can definitely stop his loop diuretic he cut back his hydrochlorothiazide to half the current dose.  This may also help his glycemic control.  After 2 weeks on the lower dose of hydrochlorothiazide he will send me some blood pressure recordings through my chart account.  Can further adjust his medications as you lose weight that way. 2. CAD: Asymptomatic 3. HLP: At last appointment his LDL was 103 and we discussed PCSK9 inhibitors, but he was skeptical due to the cost.  I am sure that his lipid profile has improved with his substantial weight loss.  He is intent on losing even more weight.  Also encouraged him to start regular physical exercise.  We will recheck his lipids when he has reached steady state to see whether or not it is necessary to add more medications.  Target LDL less than 70. 4. Obesity: He is really done exceptionally good job.  He is very disciplined.  He claims to continue losing weight.  It would be wonderful if he did lose down to a 34 inch waist, but he says he thinks 36  inches is more realistic.  Getting down to 221 pounds will put him in the overweight rather than obese category.     Medication Adjustments/Labs and  Tests Ordered: Current medicines are reviewed at length with the patient today.  Concerns regarding medicines are outlined above.  Medication changes, Labs and Tests ordered today are listed in the Patient Instructions below. Patient Instructions  Medication Instructions:  Dr Sallyanne Kuster recommends that you continue on your current medications as directed. Please refer to the Current Medication list given to you today.  Stop furosemide Reduce hydrochlorothiazide to half tablet (12.5 mg) once daily. Starting in 2 weeks please record and then send 5 days of recorded blood pressure with home monitor via [email or web address]  If you need a refill on your cardiac medications before your next appointment, please call your pharmacy.   Lab work: Your physician recommends that you return for lab work at your convenience - FASTING.  If you have labs (blood work) drawn today and your tests are completely normal, you will receive your results only by: Marland Kitchen MyChart Message (if you have MyChart) OR . A paper copy in the mail If you have any lab test that is abnormal or we need to change your treatment, we will call you to review the results.  Follow-Up: At [facility], you and your health needs are our priority.  As part of our continuing mission to provide you with exceptional heart care, we have created designated Provider Care Teams.  These Care Teams include your primary Cardiologist (physician) and Advanced Practice Providers (APPs -  Physician Assistants and Nurse Practitioners) who all work together to provide you with the care you need, when you need it. You will need a follow up appointment in 12 months.  Please call our office 2 months in advance to schedule this appointment.  You may see Sanda Klein, MD or one of the following Advanced Practice Providers  on your designated Care Team: [place], [place] . Fabian Sharp, PA-C    Signed, Sanda Klein, MD  06/12/2018 2:31 PM    [address], [place], [place]  [address] Phone: [phone]; Fax: [phone]

## 2018-06-12 NOTE — Patient Instructions (Signed)
Medication Instructions:  Dr Sallyanne Kuster recommends that you continue on your current medications as directed. Please refer to the Current Medication list given to you today.  If you need a refill on your cardiac medications before your next appointment, please call your pharmacy.   Lab work: Your physician recommends that you return for lab work at your convenience - FASTING.  If you have labs (blood work) drawn today and your tests are completely normal, you will receive your results only by: Marland Kitchen MyChart Message (if you have MyChart) OR . A paper copy in the mail If you have any lab test that is abnormal or we need to change your treatment, we will call you to review the results.  Follow-Up: At [facility], you and your health needs are our priority.  As part of our continuing mission to provide you with exceptional heart care, we have created designated Provider Care Teams.  These Care Teams include your primary Cardiologist (physician) and Advanced Practice Providers (APPs -  Physician Assistants and Nurse Practitioners) who all work together to provide you with the care you need, when you need it. You will need a follow up appointment in 12 months.  Please call our office 2 months in advance to schedule this appointment.  You may see Sanda Klein, MD or one of the following Advanced Practice Providers on your designated Care Team: [place], [place] . Fabian Sharp, PA-C

## 2018-07-13 DIAGNOSIS — H52223 Regular astigmatism, bilateral: Secondary | ICD-10-CM | POA: Diagnosis not present

## 2018-07-13 DIAGNOSIS — H25813 Combined forms of age-related cataract, bilateral: Secondary | ICD-10-CM | POA: Diagnosis not present

## 2018-07-13 DIAGNOSIS — H524 Presbyopia: Secondary | ICD-10-CM | POA: Diagnosis not present

## 2018-07-13 DIAGNOSIS — H5203 Hypermetropia, bilateral: Secondary | ICD-10-CM | POA: Diagnosis not present

## 2018-08-26 ENCOUNTER — Other Ambulatory Visit: Payer: Self-pay

## 2018-08-26 DIAGNOSIS — R972 Elevated prostate specific antigen [PSA]: Secondary | ICD-10-CM | POA: Diagnosis not present

## 2018-08-26 MED ORDER — EVOLOCUMAB 140 MG/ML ~~LOC~~ SOAJ: 140.0000 mg | SUBCUTANEOUS | 6 refills | Status: DC

## 2018-08-28 ENCOUNTER — Telehealth: Payer: Self-pay | Admitting: Cardiovascular Disease

## 2018-08-28 NOTE — Telephone Encounter (Signed)
Called [facility].  Answered all PA questions.  PA now to Pharmacy department for review.

## 2018-08-28 NOTE — Telephone Encounter (Signed)
  Pharmacist has a clinical question regarding Evolocumab (REPATHA SURECLICK) 097 MG/ML SOAJ

## 2018-09-07 ENCOUNTER — Other Ambulatory Visit: Payer: Self-pay | Admitting: Cardiovascular Disease

## 2018-09-14 ENCOUNTER — Telehealth: Payer: Self-pay

## 2018-09-14 NOTE — Telephone Encounter (Signed)
Called the pt to check on if he had gotten approval from the [place] for the repatha. Pt informed me that he did not feel safe trying the new med because most meds cause myalgias I informed him that if he would like to try that he has an approval for the year

## 2018-09-30 DIAGNOSIS — M1711 Unilateral primary osteoarthritis, right knee: Secondary | ICD-10-CM | POA: Diagnosis not present

## 2018-10-01 ENCOUNTER — Other Ambulatory Visit: Payer: Self-pay | Admitting: Cardiovascular Disease

## 2018-10-01 IMAGING — DX DG PORTABLE PELVIS
1 series · 1 of 1 positions shown · non-contrast
Comparison: Earlier same day

CLINICAL DATA: Followup right hip replacement.

EXAM:
PORTABLE PELVIS 1-2 VIEWS

[pelvis ap]
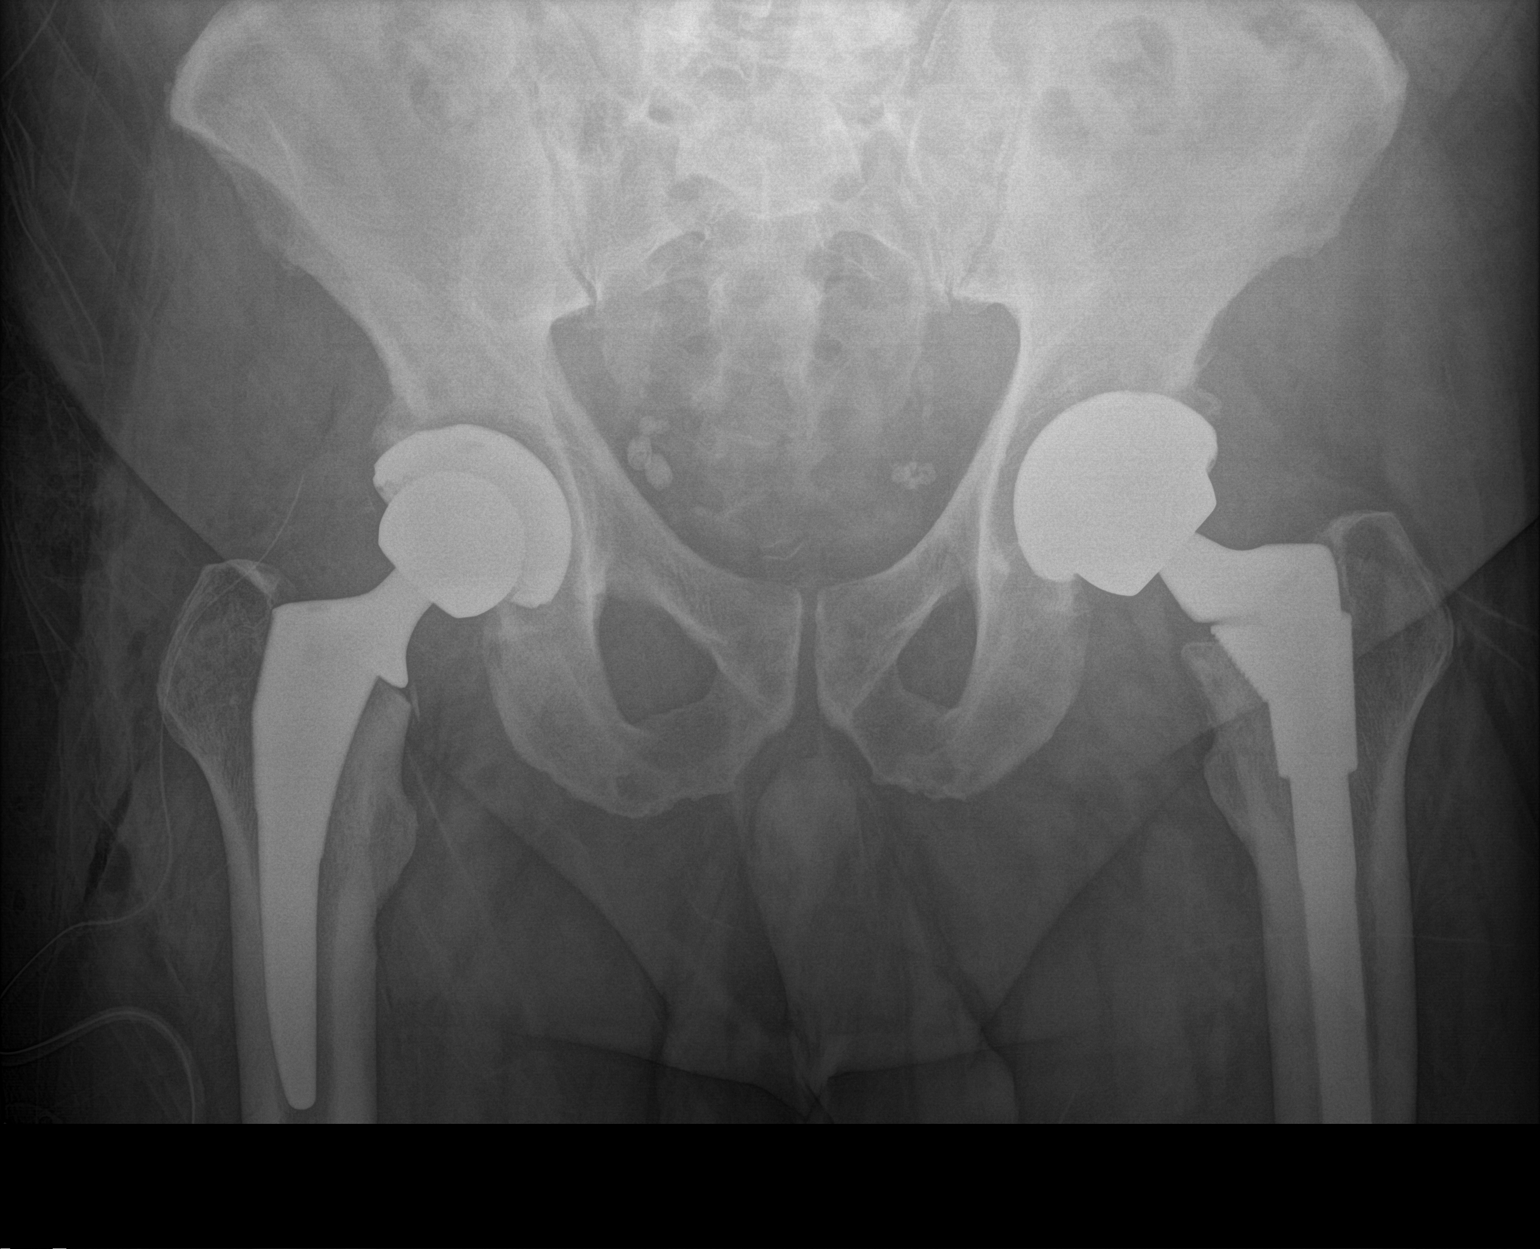

[1 of 1 positions shown; findings below may reference images not displayed]

FINDINGS: Revision of right hip arthroplasty. Components appear well
positioned. Soft tissue drain in place.
IMPRESSION: Revision of right hip arthroplasty.

## 2018-12-05 ENCOUNTER — Other Ambulatory Visit: Payer: Self-pay | Admitting: Cardiovascular Disease

## 2019-01-14 ENCOUNTER — Other Ambulatory Visit: Payer: Self-pay | Admitting: Physician Assistant

## 2019-01-14 DIAGNOSIS — K219 Gastro-esophageal reflux disease without esophagitis: Secondary | ICD-10-CM

## 2019-01-14 NOTE — Telephone Encounter (Signed)
Forwarding medication refill to PCP for review. 

## 2019-01-26 DIAGNOSIS — M25512 Pain in left shoulder: Secondary | ICD-10-CM | POA: Diagnosis not present

## 2019-02-01 ENCOUNTER — Telehealth (INDEPENDENT_AMBULATORY_CARE_PROVIDER_SITE_OTHER): Payer: Medicare Other | Admitting: Emergency Medicine

## 2019-02-01 ENCOUNTER — Encounter: Payer: Self-pay | Admitting: Emergency Medicine

## 2019-02-01 VITALS — Ht 72.0 in | Wt 260.0 lb

## 2019-02-01 DIAGNOSIS — R059 Cough, unspecified: Secondary | ICD-10-CM

## 2019-02-01 DIAGNOSIS — R05 Cough: Secondary | ICD-10-CM | POA: Diagnosis not present

## 2019-02-01 DIAGNOSIS — J22 Unspecified acute lower respiratory infection: Secondary | ICD-10-CM

## 2019-02-01 MED ORDER — PREDNISONE 20 MG PO TABS: 20.0000 mg | ORAL_TABLET | Freq: Every day | ORAL | 0 refills | Status: AC

## 2019-02-01 MED ORDER — HYDROCODONE-HOMATROPINE 5-1.5 MG/5ML PO SYRP: 5.0000 mL | ORAL_SOLUTION | Freq: Every evening | ORAL | 0 refills | Status: DC | PRN

## 2019-02-01 MED ORDER — AZITHROMYCIN 250 MG PO TABS: ORAL_TABLET | ORAL | 0 refills | Status: DC

## 2019-02-01 NOTE — Progress Notes (Signed)
Telemedicine Encounter- SOAP NOTE Established Patient  This telephone encounter was conducted with the patient's (or proxy's) verbal consent via audio telecommunications: yes/no: Yes Patient was instructed to have this encounter in a suitably private space; and to only have persons present to whom they give permission to participate. In addition, patient identity was confirmed by use of name plus two identifiers (DOB and address).  I discussed the limitations, risks, security and privacy concerns of performing an evaluation and management service by telephone and the availability of in person appointments. I also discussed with the patient that there may be a patient responsible charge related to this service. The patient expressed understanding and agreed to proceed.  I spent a total of TIME; 0 MIN TO 60 MIN: 15 minutes talking with the patient or their proxy.  No chief complaint on file. Cough and flulike symptoms  Subjective   Samuel Chambers is a 68 y.o. male established patient. Telephone visit today complaining of productive cough that started 3 days ago.  Developed severe body aches last night but denies fever or chills.  Has been taking Tylenol flu and Mucinex.  Has a history of bouts of bronchitis and he feels the same.  No other significant symptoms.  Wife not sick.  No known COVID patient contact.  Does not want to be tested for COVID.  HPI   Patient Active Problem List   Diagnosis Date Noted  . OA (osteoarthritis) of hip 03/16/2018  . Elevated hemoglobin A1c 03/14/2018  . Severe obesity (BMI 35.0-35.9 with comorbidity) ([place]) 08/04/2014  . Hyperlipidemia with target LDL less than 70; statin intolerant 04/29/2013  . CAD S/P percutaneous coronary angioplasty   . S/P laparoscopic cholecystectomy July 2014 02/18/2013  . Gallstones 01/28/2013  . OA (osteoarthritis) of knee 09/04/2012  . Essential hypertension 02/24/2007  . GERD 02/24/2007  . DEGENERATIVE JOINT DISEASE,  GENERALIZED 02/24/2007    Past Medical History:  Diagnosis Date  . Adenomatous colon polyp 03/2005  . Anxiety   . CAD S/P percutaneous coronary angioplasty 2008   PCI to circumflex with a Promus DES 2.5 mm x 8 mm  . Degenerative joint disease   . Diverticulosis   . Fibromyalgia   . GERD (gastroesophageal reflux disease)   . Hyperlipidemia    statin intolerant  . Hypertension   . Internal hemorrhoids   . Obesity, Class II, BMI 35-39.9   . PONV (postoperative nausea and vomiting)     Current Outpatient Medications  Medication Sig Dispense Refill  . clopidogrel (PLAVIX) 75 MG tablet TAKE 1 TABLET(75 MG) BY MOUTH DAILY 90 tablet 1  . DULoxetine (CYMBALTA) 60 MG capsule Take 1 capsule (60 mg total) by mouth daily. 90 capsule 3  . hydrochlorothiazide (HYDRODIURIL) 25 MG tablet Take 0.5 tablets (12.5 mg total) by mouth daily. 90 tablet 3  . losartan (COZAAR) 100 MG tablet TAKE 1 TABLET BY MOUTH DAILY 90 tablet 3  . metoprolol succinate (TOPROL-XL) 50 MG 24 hr tablet TAKE 1 TABLET BY MOUTH DAILY. TAKE WITH OR IMMEDIATELY FOLLOWING A MEAL 90 tablet 2  . sildenafil (REVATIO) 20 MG tablet TAKE 2 TABLETS BY MOUTH AS NEEDED  11  . azithromycin (ZITHROMAX) 250 MG tablet Sig as indicated 6 tablet 0  . Evolocumab (REPATHA SURECLICK) 297 MG/ML SOAJ Inject 140 mg into the skin every 14 (fourteen) days. (Patient not taking: Reported on 02/01/2019) 2 pen 6  . HYDROcodone-homatropine (HYCODAN) 5-1.5 MG/5ML syrup Take 5 mLs by mouth at bedtime as needed  for cough. 120 mL 0  . predniSONE (DELTASONE) 20 MG tablet Take 1 tablet (20 mg total) by mouth daily with breakfast for 5 days. 5 tablet 0   No current facility-administered medications for this visit.     Allergies  Allergen Reactions  . Gabapentin     Pt reports increased heart rate and blood pressure  . Crestor [Rosuvastatin] Other (See Comments)    myalgias  . Oxycodone Other (See Comments)    Hallucinations-ok in small doses  . Prednisone  Other (See Comments)    Increased BP and HR in high doses  . Valsartan Other (See Comments)    Made patient feel tired and no energy  . Warfarin And Related Other (See Comments)    Fast heart beat, went down hill, felt like he was dying    Social History   Socioeconomic History  . Marital status: Married    Spouse name: Not on file  . Number of children: 1  . Years of education: Not on file  . Highest education level: Not on file  Occupational History    Employer: [employer]eds  . Financial resource strain: Not hard at all  . Food insecurity    Worry: Never true    Inability: Never true  . Transportation needs    Medical: No    Non-medical: No  Tobacco Use  . Smoking status: Never Smoker  . Smokeless tobacco: Never Used  Substance and Sexual Activity  . Alcohol use: No  . Drug use: No  . Sexual activity: Not on file  Lifestyle  . Physical activity    Days per week: 0 days    Minutes per session: 0 min  . Stress: To some extent  Relationships  . Social connections    Talks on phone: More than three times a week    Gets together: More than three times a week    Attends religious service: More than 4 times per year    Active member of club or organization: No    Attends meetings of clubs or organizations: Never    Relationship status: Married  . Intimate partner violence    Fear of current or ex partner: No    Emotionally abused: No    Physically abused: No    Forced sexual activity: No  Other Topics Concern  . Not on file  Social History Narrative   Very little 0-2 drinks a week    Review of Systems  Constitutional: Negative.  Negative for chills and fever.  HENT: Negative.  Negative for congestion, sinus pain and sore throat.   Eyes: Negative.   Respiratory: Positive for cough and sputum production. Negative for shortness of breath.   Cardiovascular: Negative.  Negative for chest pain and palpitations.  Gastrointestinal: Negative.   Negative for abdominal pain, diarrhea, nausea and vomiting.  Genitourinary: Negative.  Negative for dysuria.  Musculoskeletal: Positive for myalgias.  Skin: Negative.  Negative for rash.  Neurological: Negative.  Negative for dizziness and headaches.  All other systems reviewed and are negative.   Objective   Vitals as reported by the patient: Today's Vitals   02/01/19 1732  Weight: 260 lb (117.9 kg)  Height: 6' (1.829 m)  Alert and oriented x3 in no apparent respiratory distress.  Diagnoses and all orders for this visit:  Cough -     predniSONE (DELTASONE) 20 MG tablet; Take 1 tablet (20 mg total) by mouth daily with breakfast for 5 days. -  HYDROcodone-homatropine (HYCODAN) 5-1.5 MG/5ML syrup; Take 5 mLs by mouth at bedtime as needed for cough.  Lower respiratory infection -     azithromycin (ZITHROMAX) 250 MG tablet; Sig as indicated     I discussed the assessment and treatment plan with the patient. The patient was provided an opportunity to ask questions and all were answered. The patient agreed with the plan and demonstrated an understanding of the instructions.   The patient was advised to call back or seek an in-person evaluation if the symptoms worsen or if the condition fails to improve as anticipated.  I provided 15 minutes of non-face-to-face time during this encounter.  Horald Pollen, MD  Primary Care at [facility]

## 2019-02-01 NOTE — Progress Notes (Signed)
Called patient to triage for appointment. Patient is complaining of cough without mucus, it started on Friday. Patient states on Saturday it was bad and on Sunday started the body ache. Patient states he is a lot better.

## 2019-02-22 ENCOUNTER — Telehealth: Payer: Self-pay | Admitting: Emergency Medicine

## 2019-02-22 NOTE — Telephone Encounter (Signed)
Copied from [place] [id number]. Topic: General - Other >> Feb 22, 2019  9:13 AM Leward Quan A wrote: Reason for CRM: Patient wife called to say that the cough that he was seen for by Dr Mitchel Honour have come back asking can they get a refill on HYDROcodone-homatropine (HYCODAN) 5-1.5 MG/5ML syrup, azithromycin (ZITHROMAX) 250 MG tablet, and Prednisone. She is asking for a call back today please to discuss if anything another alternative. Ph# [phone]

## 2019-02-23 ENCOUNTER — Encounter: Payer: Self-pay | Admitting: Emergency Medicine

## 2019-02-23 NOTE — Telephone Encounter (Signed)
Please advise on message below. Pt was last seen for a tele-med appointment on 7/6/220 and states he is not feeling any better.

## 2019-02-24 ENCOUNTER — Ambulatory Visit (INDEPENDENT_AMBULATORY_CARE_PROVIDER_SITE_OTHER)
Admission: RE | Admit: 2019-02-24 | Discharge: 2019-02-24 | Disposition: A | Discharge status: Home / Self Care | Payer: Medicare Other | Source: Ambulatory Visit

## 2019-02-24 DIAGNOSIS — R05 Cough: Secondary | ICD-10-CM

## 2019-02-24 DIAGNOSIS — R058 Other specified cough: Secondary | ICD-10-CM

## 2019-02-24 DIAGNOSIS — J9801 Acute bronchospasm: Secondary | ICD-10-CM

## 2019-02-24 DIAGNOSIS — R059 Cough, unspecified: Secondary | ICD-10-CM

## 2019-02-24 MED ORDER — HYDROCODONE-HOMATROPINE 5-1.5 MG/5ML PO SYRP: 5.0000 mL | ORAL_SOLUTION | Freq: Every evening | ORAL | 0 refills | Status: DC | PRN

## 2019-02-24 MED ORDER — PREDNISONE 20 MG PO TABS: ORAL_TABLET | ORAL | 0 refills | Status: DC

## 2019-02-24 MED ORDER — ALBUTEROL SULFATE HFA 108 (90 BASE) MCG/ACT IN AERS: 1.0000 | INHALATION_SPRAY | Freq: Four times a day (QID) | RESPIRATORY_TRACT | 0 refills | Status: DC | PRN

## 2019-02-24 NOTE — Telephone Encounter (Signed)
If he is not better, needs another telemedicine visit.  Thanks.

## 2019-02-24 NOTE — ED Provider Notes (Addendum)
Virtual Visit via Video Note:  Samuel Chambers  initiated request for Telemedicine visit with [facility] Urgent [facility] team. I connected with Samuel Chambers  on 02/24/2019 at 8:36 AM  for a synchronized telemedicine visit using a video enabled HIPPA compliant telemedicine application. I verified that I am speaking with Samuel Chambers  using two identifiers. Jaynee Eagles, PA-C  was physically located in a [facility] Urgent care site and Abhishek Levesque was located at a different location.    The limitations of evaluation and management by telemedicine as well as the availability of in-person appointments were discussed. Patient was informed that he  may incur a bill ( including co-pay) for this virtual visit encounter. Samuel Chambers  expressed understanding and gave verbal consent to proceed with virtual visit.   Tele-visit performed through phone only as patient was unable to use the video visit problems.  History of Present Illness:Samuel Chambers  is a 68 y.o. male presents with recheck on recurrent productive cough. Reports his cough worsened again in the past 4 days with associated wheezing. Has had sinus popping, sinus pain. Denies history of asthma, COPD. Denies smoking cigarettes.  Of note, patient reports that he has significant difficulty with a cough like this every year.  He has undergone multiple rounds of antibiotics in the past and typically does better with steroid course.  He has seen a pulmonologist, Dr. Melvyn Novas.  Has previously done better with steroid course.  Denies fever, chest pain.   ROS  No current facility-administered medications for this encounter.    Current Outpatient Medications  Medication Sig Dispense Refill  . azithromycin (ZITHROMAX) 250 MG tablet Sig as indicated 6 tablet 0  . clopidogrel (PLAVIX) 75 MG tablet TAKE 1 TABLET(75 MG) BY MOUTH DAILY 90 tablet 1  . DULoxetine (CYMBALTA) 60 MG capsule Take 1 capsule (60 mg total) by mouth daily. 90  capsule 3  . Evolocumab (REPATHA SURECLICK) 606 MG/ML SOAJ Inject 140 mg into the skin every 14 (fourteen) days. (Patient not taking: Reported on 02/01/2019) 2 pen 6  . hydrochlorothiazide (HYDRODIURIL) 25 MG tablet Take 0.5 tablets (12.5 mg total) by mouth daily. 90 tablet 3  . HYDROcodone-homatropine (HYCODAN) 5-1.5 MG/5ML syrup Take 5 mLs by mouth at bedtime as needed for cough. 120 mL 0  . losartan (COZAAR) 100 MG tablet TAKE 1 TABLET BY MOUTH DAILY 90 tablet 3  . metoprolol succinate (TOPROL-XL) 50 MG 24 hr tablet TAKE 1 TABLET BY MOUTH DAILY. TAKE WITH OR IMMEDIATELY FOLLOWING A MEAL 90 tablet 2  . sildenafil (REVATIO) 20 MG tablet TAKE 2 TABLETS BY MOUTH AS NEEDED  11     Allergies  Allergen Reactions  . Gabapentin     Pt reports increased heart rate and blood pressure  . Crestor [Rosuvastatin] Other (See Comments)    myalgias  . Oxycodone Other (See Comments)    Hallucinations-ok in small doses  . Prednisone Other (See Comments)    Increased BP and HR in high doses  . Valsartan Other (See Comments)    Made patient feel tired and no energy  . Warfarin And Related Other (See Comments)    Fast heart beat, went down hill, felt like he was dying     Past Medical History:  Diagnosis Date  . Adenomatous colon polyp 03/2005  . Anxiety   . CAD S/P percutaneous coronary angioplasty 2008   PCI to circumflex with a Promus DES 2.5 mm x 8 mm  .  Degenerative joint disease   . Diverticulosis   . Fibromyalgia   . GERD (gastroesophageal reflux disease)   . Hyperlipidemia    statin intolerant  . Hypertension   . Internal hemorrhoids   . Obesity, Class II, BMI 35-39.9   . PONV (postoperative nausea and vomiting)     Past Surgical History:  Procedure Laterality Date  . CHOLECYSTECTOMY N/A 02/18/2013   Procedure: LAPAROSCOPIC CHOLECYSTECTOMY WITH INTRAOPERATIVE CHOLANGIOGRAM;  Surgeon: Pedro Earls, MD;  Location: [facility];  Service: General;  Laterality: N/A;  . COLONOSCOPY    .  CORONARY ANGIOPLASTY WITH STENT PLACEMENT  04/30/2007   2.5 mm Promus stent that was to 95% Circ;had 60-70% lesion to RCA  . DOPPLER ECHOCARDIOGRAPHY  05/27/2002   [facility].-normal EF 55-66%,  . HEEL SPUR SURGERY    . HEMORRHOID SURGERY    . hip surgery  about 10 to 12 years ago   complete  . Holter Monitor  04/07/2007   sinus tachy.;  . KNEE ARTHROSCOPY  03/27/2012   Procedure: ARTHROSCOPY KNEE;  Surgeon: Gearlean Alf, MD;  Location: [facility];  Service: Orthopedics;  Laterality: Left;  . NM MYOCAR PERF WALL MOTION  12/19/2011   EXERCISED FOR 8-1/2 MINUTES RECHING 10 METABOLIC EQUIVALENTS. NO EVIDENCE  OF ISCHEMIA OR INFARCTION . EF 71%  . renal dopplers  04/08/2007   relatively normal  . TOTAL HIP ARTHROPLASTY Right 03/16/2018   Procedure: RIGHT TOTAL HIP ARTHROPLASTY ANTERIOR APPROACH;  Surgeon: Gaynelle Arabian, MD;  Location: [facility];  Service: Orthopedics;  Laterality: Right;  . TOTAL KNEE ARTHROPLASTY Left 09/04/2012   Procedure: TOTAL KNEE ARTHROPLASTY;  Surgeon: Gearlean Alf, MD;  Location: [facility];  Service: Orthopedics;  Laterality: Left;      Observations/Objective: Physical Exam Constitutional:      Appearance: He is well-developed.  Pulmonary:     Effort: Pulmonary effort is normal. No respiratory distress.  Neurological:     Mental Status: He is oriented to person, place, and time.  Psychiatric:        Mood and Affect: Mood normal.        Behavior: Behavior normal.        Thought Content: Thought content normal.        Judgment: Judgment normal.      Assessment and Plan:  1. Bronchospasm   2. Productive cough     Counseled on possible complications using prednisone in light of COVID-19.  Patient would like to pursue the steroid course as he is done very well with this in the past.  Will use a 5-day course, albuterol inhaler and refilled his cough syrup.  Strict ER and return to clinic precautions.   Follow Up Instructions:    I discussed the assessment  and treatment plan with the patient. The patient was provided an opportunity to ask questions and all were answered. The patient agreed with the plan and demonstrated an understanding of the instructions.   The patient was advised to call back or seek an in-person evaluation if the symptoms worsen or if the condition fails to improve as anticipated.  I provided 20 minutes of non-face-to-face time during this encounter.    Jaynee Eagles, PA-C  02/24/2019 8:36 AM         Jaynee Eagles, PA-C 02/24/19 0853    Jaynee Eagles, PA-C 02/24/19 [phone]

## 2019-02-24 NOTE — Telephone Encounter (Signed)
He used the video urgent care and got some medication for relief.   Cough and brown flem that is still coming out.

## 2019-03-04 ENCOUNTER — Encounter (HOSPITAL_COMMUNITY): Payer: Self-pay

## 2019-03-04 ENCOUNTER — Ambulatory Visit (HOSPITAL_COMMUNITY)
Admission: EM | Admit: 2019-03-04 | Discharge: 2019-03-04 | Disposition: A | Discharge status: Home / Self Care | Payer: Medicare Other | Attending: Emergency Medicine | Admitting: Emergency Medicine

## 2019-03-04 ENCOUNTER — Other Ambulatory Visit: Payer: Self-pay

## 2019-03-04 ENCOUNTER — Ambulatory Visit (INDEPENDENT_AMBULATORY_CARE_PROVIDER_SITE_OTHER): Payer: Medicare Other

## 2019-03-04 DIAGNOSIS — R053 Chronic cough: Secondary | ICD-10-CM

## 2019-03-04 DIAGNOSIS — Z20828 Contact with and (suspected) exposure to other viral communicable diseases: Secondary | ICD-10-CM | POA: Insufficient documentation

## 2019-03-04 DIAGNOSIS — R05 Cough: Secondary | ICD-10-CM | POA: Diagnosis not present

## 2019-03-04 DIAGNOSIS — R0602 Shortness of breath: Secondary | ICD-10-CM | POA: Insufficient documentation

## 2019-03-04 MED ORDER — IPRATROPIUM BROMIDE 0.02 % IN SOLN: 0.5000 mg | Freq: Four times a day (QID) | RESPIRATORY_TRACT | 12 refills | Status: DC

## 2019-03-04 MED ORDER — BENZONATATE 100 MG PO CAPS: 100.0000 mg | ORAL_CAPSULE | Freq: Three times a day (TID) | ORAL | 0 refills | Status: DC

## 2019-03-04 MED ORDER — CETIRIZINE HCL 10 MG PO TABS: 10.0000 mg | ORAL_TABLET | Freq: Every day | ORAL | 0 refills | Status: DC

## 2019-03-04 NOTE — ED Triage Notes (Signed)
Pt states she has a cough and congestion. This  been going on for a month. Pt states been using OTC medicine. Pt states he's getting short of breathe now.

## 2019-03-04 NOTE — Discharge Instructions (Signed)
°  Your chest x-ray was normal today. There is not evidence of bacterial infection including bronchitis or pneumonia.  You have been tested for Covid-19.  This could be the source of your chronic cough and worsening shortness of breath despite completion of an antibiotic and prednisone. It is recommended you stay at home and minimize contact with others until your results come back in about 4-5 days.  Because of the chronic nature of your cough, it is recommended you follow up with family medicine and your pulmonologist again to see if additional testing or treatment is indicated such as a daily preventative inhaler or allergy testing.  It is also important to discuss your medications as some medications can cause a chronic cough.   Call 911 or have someone drive you to the hospital if you develop worsening shortness of breath, chest pain, or other new concerning symptoms develop.

## 2019-03-04 NOTE — ED Provider Notes (Signed)
[place]    CSN: [id number] Arrival date & time: 03/04/19  1043     History   Chief Complaint Chief Complaint  Patient presents with  . Cough    HPI Samuel Chambers is a 68 y.o. male.   HPI Samuel Chambers is a 68 y.o. male presenting to UC with c/o cough and congestion that started about 1 month ago. Pt has completed two Telemedicine visits. He was prescribed azithromycin as well as two courses of prednisone and an inhaler with minimal relief. Pt has also been prescribed hydromet during both visits with mild relief. Pt denies sick contacts or recent travel. Hx of similar persistent cough about once a year or once every other year.  Denies fever, chills, n/v/d. No hx of asthma. He has never smoked.   Past Medical History:  Diagnosis Date  . Adenomatous colon polyp 03/2005  . Anxiety   . CAD S/P percutaneous coronary angioplasty 2008   PCI to circumflex with a Promus DES 2.5 mm x 8 mm  . Degenerative joint disease   . Diverticulosis   . Fibromyalgia   . GERD (gastroesophageal reflux disease)   . Hyperlipidemia    statin intolerant  . Hypertension   . Internal hemorrhoids   . Obesity, Class II, BMI 35-39.9   . PONV (postoperative nausea and vomiting)     Patient Active Problem List   Diagnosis Date Noted  . OA (osteoarthritis) of hip 03/16/2018  . Elevated hemoglobin A1c 03/14/2018  . Severe obesity (BMI 35.0-35.9 with comorbidity) ([place]) 08/04/2014  . Hyperlipidemia with target LDL less than 70; statin intolerant 04/29/2013  . CAD S/P percutaneous coronary angioplasty   . S/P laparoscopic cholecystectomy July 2014 02/18/2013  . Gallstones 01/28/2013  . OA (osteoarthritis) of knee 09/04/2012  . Essential hypertension 02/24/2007  . GERD 02/24/2007  . DEGENERATIVE JOINT DISEASE, GENERALIZED 02/24/2007    Past Surgical History:  Procedure Laterality Date  . CHOLECYSTECTOMY N/A 02/18/2013   Procedure: LAPAROSCOPIC CHOLECYSTECTOMY WITH  INTRAOPERATIVE CHOLANGIOGRAM;  Surgeon: Pedro Earls, MD;  Location: [facility];  Service: General;  Laterality: N/A;  . COLONOSCOPY    . CORONARY ANGIOPLASTY WITH STENT PLACEMENT  04/30/2007   2.5 mm Promus stent that was to 95% Circ;had 60-70% lesion to RCA  . DOPPLER ECHOCARDIOGRAPHY  05/27/2002   [facility].-normal EF 55-66%,  . HEEL SPUR SURGERY    . HEMORRHOID SURGERY    . hip surgery  about 10 to 12 years ago   complete  . Holter Monitor  04/07/2007   sinus tachy.;  . KNEE ARTHROSCOPY  03/27/2012   Procedure: ARTHROSCOPY KNEE;  Surgeon: Gearlean Alf, MD;  Location: [facility];  Service: Orthopedics;  Laterality: Left;  . NM MYOCAR PERF WALL MOTION  12/19/2011   EXERCISED FOR 8-1/2 MINUTES RECHING 10 METABOLIC EQUIVALENTS. NO EVIDENCE  OF ISCHEMIA OR INFARCTION . EF 71%  . renal dopplers  04/08/2007   relatively normal  . TOTAL HIP ARTHROPLASTY Right 03/16/2018   Procedure: RIGHT TOTAL HIP ARTHROPLASTY ANTERIOR APPROACH;  Surgeon: Gaynelle Arabian, MD;  Location: [facility];  Service: Orthopedics;  Laterality: Right;  . TOTAL KNEE ARTHROPLASTY Left 09/04/2012   Procedure: TOTAL KNEE ARTHROPLASTY;  Surgeon: Gearlean Alf, MD;  Location: [facility];  Service: Orthopedics;  Laterality: Left;       Home Medications    Prior to Admission medications   Medication Sig Start Date End Date Taking? Authorizing Provider  albuterol (VENTOLIN HFA) 108 (90 Base)  MCG/ACT inhaler Inhale 1-2 puffs into the lungs every 6 (six) hours as needed for wheezing or shortness of breath. 02/24/19   Jaynee Eagles, PA-C  azithromycin [facility]) 250 MG tablet Sig as indicated 02/01/19   Horald Pollen, MD  benzonatate (TESSALON) 100 MG capsule Take 1 capsule (100 mg total) by mouth every 8 (eight) hours. 03/04/19   Noe Gens, PA-C  cetirizine (ZYRTEC) 10 MG tablet Take 1 tablet (10 mg total) by mouth daily. 03/04/19   Noe Gens, PA-C  clopidogrel (PLAVIX) 75 MG tablet TAKE 1 TABLET(75 MG) BY MOUTH DAILY 12/07/18    Croitoru, Mihai, MD  DULoxetine (CYMBALTA) 60 MG capsule Take 1 capsule (60 mg total) by mouth daily. 05/14/18   McVey, Gelene Mink, PA-C  Evolocumab (REPATHA SURECLICK) 993 MG/ML SOAJ Inject 140 mg into the skin every 14 (fourteen) days. Patient not taking: Reported on 02/01/2019 08/26/18   Croitoru, Mihai, MD  hydrochlorothiazide (HYDRODIURIL) 25 MG tablet Take 0.5 tablets (12.5 mg total) by mouth daily. 06/12/18   Croitoru, Mihai, MD  HYDROcodone-homatropine (HYCODAN) 5-1.5 MG/5ML syrup Take 5 mLs by mouth at bedtime as needed for cough. 02/24/19   Jaynee Eagles, PA-C  ipratropium (ATROVENT) 0.02 % nebulizer solution Take 2.5 mLs (0.5 mg total) by nebulization 4 (four) times daily. 03/04/19   Noe Gens, PA-C  losartan (COZAAR) 100 MG tablet TAKE 1 TABLET BY MOUTH DAILY 09/08/18   Croitoru, Mihai, MD  metoprolol succinate (TOPROL-XL) 50 MG 24 hr tablet TAKE 1 TABLET BY MOUTH DAILY. TAKE WITH OR IMMEDIATELY FOLLOWING A MEAL 10/02/18   Croitoru, Dani Gobble, MD  predniSONE (DELTASONE) 20 MG tablet Take 2 tablets daily with breakfast. 02/24/19   Jaynee Eagles, PA-C  sildenafil (REVATIO) 20 MG tablet TAKE 2 TABLETS BY MOUTH AS NEEDED 11/21/17   [provider]    Family History Family History  Problem Relation Age of Onset  . Breast cancer Mother   . Diabetes Mother   . Prostate cancer Father   . Colon cancer Neg Hx     Social History Social History   Tobacco Use  . Smoking status: Never Smoker  . Smokeless tobacco: Never Used  Substance Use Topics  . Alcohol use: No  . Drug use: No     Allergies   Gabapentin, Crestor [rosuvastatin], Oxycodone, Prednisone, Valsartan, and Warfarin and related   Review of Systems Review of Systems  Constitutional: Negative for chills and fever.  HENT: Positive for congestion. Negative for ear pain, sore throat, trouble swallowing and voice change.   Respiratory: Positive for cough and shortness of breath.   Cardiovascular: Negative for chest  pain and palpitations.  Gastrointestinal: Negative for abdominal pain, diarrhea, nausea and vomiting.  Musculoskeletal: Negative for arthralgias, back pain and myalgias.  Skin: Negative for rash.     Physical Exam Triage Vital Signs ED Triage Vitals [03/04/19 1146]  Enc Vitals Group     BP (!) 166/87     Pulse Rate (!) 105     Resp 20     Temp 98.5 F (36.9 C)     Temp src      SpO2 97 %     Weight      Height      Head Circumference      Peak Flow      Pain Score      Pain Loc      Pain Edu?      Excl. in [place]?    No data  found.  Updated Vital Signs BP (!) 166/87 (BP Location: Right Arm)   Pulse (!) 105   Temp 98.5 F (36.9 C)   Resp 20   SpO2 97%   Visual Acuity Right Eye Distance:   Left Eye Distance:   Bilateral Distance:    Right Eye Near:   Left Eye Near:    Bilateral Near:     Physical Exam Vitals signs and nursing note reviewed.  Constitutional:      General: He is not in acute distress.    Appearance: Normal appearance. He is well-developed. He is obese. He is not ill-appearing.  HENT:     Head: Normocephalic and atraumatic.     Right Ear: Tympanic membrane normal.     Left Ear: Tympanic membrane normal.     Nose: Nose normal.     Mouth/Throat:     Lips: Pink.     Mouth: Mucous membranes are moist.     Pharynx: Oropharynx is clear. Uvula midline.  Neck:     Musculoskeletal: Normal range of motion.  Cardiovascular:     Rate and Rhythm: Normal rate and regular rhythm.     Comments: Mild tachycardia in triage, regular rate and rhythm on exam. Pulmonary:     Effort: Pulmonary effort is normal.     Breath sounds: Normal breath sounds.  Musculoskeletal: Normal range of motion.  Skin:    General: Skin is warm and dry.  Neurological:     Mental Status: He is alert and oriented to person, place, and time.  Psychiatric:        Behavior: Behavior normal.      UC Treatments / Results  Labs (all labs ordered are listed, but only abnormal  results are displayed) Labs Reviewed  NOVEL CORONAVIRUS, NAA (HOSPITAL ORDER, SEND-OUT TO REF LAB)    EKG   Radiology Dg Chest 2 View  Result Date: 03/04/2019 CLINICAL DATA:  One month history of cough and shortness of breath. EXAM: CHEST - 2 VIEW COMPARISON:  Multiple prior chest films. The most recent is 12/25/2017. FINDINGS: The heart is normal in size. The mediastinal and hilar contours appear normal in stable. There is mild tortuosity of the thoracic aorta. The lungs are clear. No pleural effusions. No worrisome pulmonary lesions. The bony thorax is intact. IMPRESSION: No acute cardiopulmonary findings. No change since prior chest film. Electronically Signed   By: Marijo Sanes M.D.   On: 03/04/2019 12:43    Procedures Procedures (including critical care time)  Medications Ordered in UC Medications - No data to display  Initial Impression / Assessment and Plan / UC Course  I have reviewed the triage vital signs and the nursing notes.  Pertinent labs & imaging results that were available during my care of the patient were reviewed by me and considered in my medical decision making (see chart for details).     Lungs: CTAB CXR: Normal No indication for additional antibiotics at this time. Covid test pending given current pandemic as well as pt c/o mild SOB  Encouraged f/u with PCP and pulmonology  Will try symptomatic in meantime. AVS provided.  Final Clinical Impressions(s) / UC Diagnoses   Final diagnoses:  Chronic cough  Mild shortness of breath     Discharge Instructions      Your chest x-ray was normal today. There is not evidence of bacterial infection including bronchitis or pneumonia.  You have been tested for Covid-19.  This could be the source of your chronic cough  and worsening shortness of breath despite completion of an antibiotic and prednisone. It is recommended you stay at home and minimize contact with others until your results come back in about 4-5  days.  Because of the chronic nature of your cough, it is recommended you follow up with family medicine and your pulmonologist again to see if additional testing or treatment is indicated such as a daily preventative inhaler or allergy testing.  It is also important to discuss your medications as some medications can cause a chronic cough.   Call 911 or have someone drive you to the hospital if you develop worsening shortness of breath, chest pain, or other new concerning symptoms develop.     ED Prescriptions    Medication Sig Dispense Auth. Provider   benzonatate (TESSALON) 100 MG capsule Take 1 capsule (100 mg total) by mouth every 8 (eight) hours. 21 capsule Gerarda Fraction, Agostino Gorin O, PA-C   cetirizine (ZYRTEC) 10 MG tablet Take 1 tablet (10 mg total) by mouth daily. 30 tablet Gerarda Fraction, Olie Dibert O, PA-C   ipratropium (ATROVENT) 0.02 % nebulizer solution Take 2.5 mLs (0.5 mg total) by nebulization 4 (four) times daily. 75 mL Noe Gens, PA-C     Controlled Substance Prescriptions [place] Controlled Substance Registry consulted? Not Applicable   Tyrell Antonio 03/04/19 1321

## 2019-03-05 LAB — NOVEL CORONAVIRUS, NAA (HOSP ORDER, SEND-OUT TO REF LAB; TAT 18-24 HRS): SARS-CoV-2, NAA: NOT DETECTED

## 2019-03-08 ENCOUNTER — Encounter (HOSPITAL_COMMUNITY): Payer: Self-pay

## 2019-03-17 DIAGNOSIS — M5416 Radiculopathy, lumbar region: Secondary | ICD-10-CM | POA: Diagnosis not present

## 2019-03-17 DIAGNOSIS — M25561 Pain in right knee: Secondary | ICD-10-CM | POA: Diagnosis not present

## 2019-03-17 DIAGNOSIS — Z96641 Presence of right artificial hip joint: Secondary | ICD-10-CM | POA: Diagnosis not present

## 2019-03-26 DIAGNOSIS — M25512 Pain in left shoulder: Secondary | ICD-10-CM | POA: Diagnosis not present

## 2019-04-28 ENCOUNTER — Telehealth: Payer: Self-pay | Admitting: Emergency Medicine

## 2019-04-28 NOTE — Telephone Encounter (Signed)
Please call [name] to reschedule awv  . Pt is he;ping Grandchildren that day .[phone] FR

## 2019-05-04 ENCOUNTER — Ambulatory Visit: Payer: Self-pay

## 2019-05-10 ENCOUNTER — Ambulatory Visit (INDEPENDENT_AMBULATORY_CARE_PROVIDER_SITE_OTHER): Payer: Medicare Other | Admitting: Emergency Medicine

## 2019-05-10 VITALS — BP 138/84 | Ht 72.0 in | Wt 260.0 lb

## 2019-05-10 DIAGNOSIS — Z0000 Encounter for general adult medical examination without abnormal findings: Secondary | ICD-10-CM | POA: Diagnosis not present

## 2019-05-10 NOTE — Patient Instructions (Signed)
Thank you for taking time to come for your Medicare Wellness Visit. I appreciate your ongoing commitment to your health goals. Please review the following plan we discussed and let me know if I can assist you in the future.  Leroy Kennedy LPN  Preventive Care 64 Years and Older, Male Preventive care refers to lifestyle choices and visits with your health care provider that can promote health and wellness. This includes:  A yearly physical exam. This is also called an annual well check.  Regular dental and eye exams.  Immunizations.  Screening for certain conditions.  Healthy lifestyle choices, such as diet and exercise. What can I expect for my preventive care visit? Physical exam Your health care provider will check:  Height and weight. These may be used to calculate body mass index (BMI), which is a measurement that tells if you are at a healthy weight.  Heart rate and blood pressure.  Your skin for abnormal spots. Counseling Your health care provider may ask you questions about:  Alcohol, tobacco, and drug use.  Emotional well-being.  Home and relationship well-being.  Sexual activity.  Eating habits.  History of falls.  Memory and ability to understand (cognition).  Work and work Statistician. What immunizations do I need?  Influenza (flu) vaccine  This is recommended every year. Tetanus, diphtheria, and pertussis (Tdap) vaccine  You may need a Td booster every 10 years. Varicella (chickenpox) vaccine  You may need this vaccine if you have not already been vaccinated. Zoster (shingles) vaccine  You may need this after age 66. Pneumococcal conjugate (PCV13) vaccine  One dose is recommended after age 84. Pneumococcal polysaccharide (PPSV23) vaccine  One dose is recommended after age 88. Measles, mumps, and rubella (MMR) vaccine  You may need at least one dose of MMR if you were born in 1957 or later. You may also need a second dose. Meningococcal  conjugate (MenACWY) vaccine  You may need this if you have certain conditions. Hepatitis A vaccine  You may need this if you have certain conditions or if you travel or work in places where you may be exposed to hepatitis A. Hepatitis B vaccine  You may need this if you have certain conditions or if you travel or work in places where you may be exposed to hepatitis B. Haemophilus influenzae type b (Hib) vaccine  You may need this if you have certain conditions. You may receive vaccines as individual doses or as more than one vaccine together in one shot (combination vaccines). Talk with your health care provider about the risks and benefits of combination vaccines. What tests do I need? Blood tests  Lipid and cholesterol levels. These may be checked every 5 years, or more frequently depending on your overall health.  Hepatitis C test.  Hepatitis B test. Screening  Lung cancer screening. You may have this screening every year starting at age 24 if you have a 30-pack-year history of smoking and currently smoke or have quit within the past 15 years.  Colorectal cancer screening. All adults should have this screening starting at age 52 and continuing until age 61. Your health care provider may recommend screening at age 57 if you are at increased risk. You will have tests every 1-10 years, depending on your results and the type of screening test.  Prostate cancer screening. Recommendations will vary depending on your family history and other risks.  Diabetes screening. This is done by checking your blood sugar (glucose) after you have not eaten for  a while (fasting). You may have this done every 1-3 years.  Abdominal aortic aneurysm (AAA) screening. You may need this if you are a current or former smoker.  Sexually transmitted disease (STD) testing. Follow these instructions at home: Eating and drinking  Eat a diet that includes fresh fruits and vegetables, whole grains, lean  protein, and low-fat dairy products. Limit your intake of foods with high amounts of sugar, saturated fats, and salt.  Take vitamin and mineral supplements as recommended by your health care provider.  Do not drink alcohol if your health care provider tells you not to drink.  If you drink alcohol: ? Limit how much you have to 0-2 drinks a day. ? Be aware of how much alcohol is in your drink. In the [place] one drink equals one 12 oz bottle of beer (355 mL), one 5 oz glass of wine (148 mL), or one 1 oz glass of hard liquor (44 mL). Lifestyle  Take daily care of your teeth and gums.  Stay active. Exercise for at least 30 minutes on 5 or more days each week.  Do not use any products that contain nicotine or tobacco, such as cigarettes, e-cigarettes, and chewing tobacco. If you need help quitting, ask your health care provider.  If you are sexually active, practice safe sex. Use a condom or other form of protection to prevent STIs (sexually transmitted infections).  Talk with your health care provider about taking a low-dose aspirin or statin. What's next?  Visit your health care provider once a year for a well check visit.  Ask your health care provider how often you should have your eyes and teeth checked.  Stay up to date on all vaccines. This information is not intended to replace advice given to you by your health care provider. Make sure you discuss any questions you have with your health care provider. Document Released: 08/11/2015 Document Revised: 07/09/2018 Document Reviewed: 07/09/2018 Elsevier Patient Education  2020 [employer].

## 2019-05-10 NOTE — Progress Notes (Signed)
Presents today for [employer] Visit   Date of last exam: 02/01/2019  Interpreter used for this visit? No  I connected with  Samuel Chambers on 05/10/19 by a telephone  and verified that I am speaking with the correct person using two identifiers.   I discussed the limitations of evaluation and management by telemedicine. The patient expressed understanding and agreed to proceed.   Patient Care Team: Horald Pollen, MD as PCP - General (Internal Medicine) Sanda Klein, MD as PCP - Cardiology (Cardiology) Ladene Artist, MD as Consulting Physician (Gastroenterology) Sanda Klein, MD as Consulting Physician (Cardiology)   Other items to address today  Discussed immunizations Patient will think about coming in for flu-shot Discussed Eye/Dental    Other Screening:  Last lipid screening: 06/02/2018  ADVANCE DIRECTIVES: Discussed:yes On File: no Materials Provided: yes  Immunization status:  Immunization History  Administered Date(s) Administered  . Influenza, High Dose Seasonal PF 05/14/2018  . Influenza,inj,Quad PF,6+ Mos 09/02/2016  . Pneumococcal Conjugate-13 09/02/2016  . Tdap 02/13/2014     Health Maintenance Due  Topic Date Due  . PNA vac Low Risk Adult (2 of 2 - PPSV23) 09/02/2017  . INFLUENZA VACCINE  02/27/2019     Functional Status Survey: Is the patient deaf or have difficulty hearing?: Yes(hearing aids does not wear them) Does the patient have difficulty seeing, even when wearing glasses/contacts?: No Does the patient have difficulty concentrating, remembering, or making decisions?: No Does the patient have difficulty walking or climbing stairs?: Yes(take time can climb) Does the patient have difficulty dressing or bathing?: No Does the patient have difficulty doing errands alone such as visiting a doctor's office or shopping?: No   6CIT Screen 05/10/2019 10/14/2017  What Year? 0 points 0 points  What month? 0  points 0 points  What time? 0 points 0 points  Count back from 20 0 points 0 points  Months in reverse 0 points 0 points  Repeat phrase 0 points 6 points  Total Score 0 6        Clinical Support from 05/10/2019 in [facility]  AUDIT-C Score  0       Home Environment:   Lives one story home No scattered rugs Yes grab bars  Adequate lighting/no clutter Can climb stairs takes time has had a hip replacement and needs a knee replacement Patient goes fishing 3 times a week    Patient Active Problem List   Diagnosis Date Noted  . OA (osteoarthritis) of hip 03/16/2018  . Elevated hemoglobin A1c 03/14/2018  . Severe obesity (BMI 35.0-35.9 with comorbidity) ([place]) 08/04/2014  . Hyperlipidemia with target LDL less than 70; statin intolerant 04/29/2013  . CAD S/P percutaneous coronary angioplasty   . S/P laparoscopic cholecystectomy July 2014 02/18/2013  . Gallstones 01/28/2013  . OA (osteoarthritis) of knee 09/04/2012  . Essential hypertension 02/24/2007  . GERD 02/24/2007  . DEGENERATIVE JOINT DISEASE, GENERALIZED 02/24/2007     Past Medical History:  Diagnosis Date  . Adenomatous colon polyp 03/2005  . Anxiety   . CAD S/P percutaneous coronary angioplasty 2008   PCI to circumflex with a Promus DES 2.5 mm x 8 mm  . Degenerative joint disease   . Diverticulosis   . Fibromyalgia   . GERD (gastroesophageal reflux disease)   . Hyperlipidemia    statin intolerant  . Hypertension   . Internal hemorrhoids   . Obesity, Class II, BMI 35-39.9   . PONV (postoperative  nausea and vomiting)      Past Surgical History:  Procedure Laterality Date  . CHOLECYSTECTOMY N/A 02/18/2013   Procedure: LAPAROSCOPIC CHOLECYSTECTOMY WITH INTRAOPERATIVE CHOLANGIOGRAM;  Surgeon: Pedro Earls, MD;  Location: [facility];  Service: General;  Laterality: N/A;  . COLONOSCOPY    . CORONARY ANGIOPLASTY WITH STENT PLACEMENT  04/30/2007   2.5 mm Promus stent that was to 95% Circ;had 60-70%  lesion to RCA  . DOPPLER ECHOCARDIOGRAPHY  05/27/2002   [facility].-normal EF 55-66%,  . HEEL SPUR SURGERY    . HEMORRHOID SURGERY    . hip surgery  about 10 to 12 years ago   complete  . Holter Monitor  04/07/2007   sinus tachy.;  . KNEE ARTHROSCOPY  03/27/2012   Procedure: ARTHROSCOPY KNEE;  Surgeon: Gearlean Alf, MD;  Location: [facility];  Service: Orthopedics;  Laterality: Left;  . NM MYOCAR PERF WALL MOTION  12/19/2011   EXERCISED FOR 8-1/2 MINUTES RECHING 10 METABOLIC EQUIVALENTS. NO EVIDENCE  OF ISCHEMIA OR INFARCTION . EF 71%  . renal dopplers  04/08/2007   relatively normal  . TOTAL HIP ARTHROPLASTY Right 03/16/2018   Procedure: RIGHT TOTAL HIP ARTHROPLASTY ANTERIOR APPROACH;  Surgeon: Gaynelle Arabian, MD;  Location: [facility];  Service: Orthopedics;  Laterality: Right;  . TOTAL KNEE ARTHROPLASTY Left 09/04/2012   Procedure: TOTAL KNEE ARTHROPLASTY;  Surgeon: Gearlean Alf, MD;  Location: [facility];  Service: Orthopedics;  Laterality: Left;     Family History  Problem Relation Age of Onset  . Breast cancer Mother   . Diabetes Mother   . Prostate cancer Father   . Colon cancer Neg Hx      Social History   Socioeconomic History  . Marital status: Married    Spouse name: Not on file  . Number of children: 1  . Years of education: Not on file  . Highest education level: Not on file  Occupational History    Employer: [employer]  . Financial resource strain: Not hard at all  . Food insecurity    Worry: Never true    Inability: Never true  . Transportation needs    Medical: No    Non-medical: No  Tobacco Use  . Smoking status: Never Smoker  . Smokeless tobacco: Never Used  Substance and Sexual Activity  . Alcohol use: No  . Drug use: No  . Sexual activity: Not on file  Lifestyle  . Physical activity    Days per week: 0 days    Minutes per session: 0 min  . Stress: To some extent  Relationships  . Social connections    Talks on phone: More than  three times a week    Gets together: More than three times a week    Attends religious service: More than 4 times per year    Active member of club or organization: No    Attends meetings of clubs or organizations: Never    Relationship status: Married  . Intimate partner violence    Fear of current or ex partner: No    Emotionally abused: No    Physically abused: No    Forced sexual activity: No  Other Topics Concern  . Not on file  Social History Narrative   Very little 0-2 drinks a week     Allergies  Allergen Reactions  . Gabapentin     Pt reports increased heart rate and blood pressure  . Crestor [Rosuvastatin] Other (See Comments)  myalgias  . Oxycodone Other (See Comments)    Hallucinations-ok in small doses  . Prednisone Other (See Comments)    Increased BP and HR in high doses  . Valsartan Other (See Comments)    Made patient feel tired and no energy  . Warfarin And Related Other (See Comments)    Fast heart beat, went down hill, felt like he was dying     Prior to Admission medications   Medication Sig Start Date End Date Taking? Authorizing Provider  clopidogrel (PLAVIX) 75 MG tablet TAKE 1 TABLET(75 MG) BY MOUTH DAILY 12/07/18  Yes Croitoru, Mihai, MD  DULoxetine (CYMBALTA) 60 MG capsule Take 1 capsule (60 mg total) by mouth daily. 05/14/18  Yes McVey, Gelene Mink, PA-C  hydrochlorothiazide (HYDRODIURIL) 25 MG tablet Take 0.5 tablets (12.5 mg total) by mouth daily. 06/12/18  Yes Croitoru, Mihai, MD  losartan (COZAAR) 100 MG tablet TAKE 1 TABLET BY MOUTH DAILY 09/08/18  Yes Croitoru, Mihai, MD  metoprolol succinate (TOPROL-XL) 50 MG 24 hr tablet TAKE 1 TABLET BY MOUTH DAILY. TAKE WITH OR IMMEDIATELY FOLLOWING A MEAL 10/02/18  Yes Croitoru, Mihai, MD  albuterol (VENTOLIN HFA) 108 (90 Base) MCG/ACT inhaler Inhale 1-2 puffs into the lungs every 6 (six) hours as needed for wheezing or shortness of breath. 02/24/19   Jaynee Eagles, PA-C  azithromycin [facility]) 250  MG tablet Sig as indicated 02/01/19   Horald Pollen, MD  benzonatate (TESSALON) 100 MG capsule Take 1 capsule (100 mg total) by mouth every 8 (eight) hours. Patient not taking: Reported on 05/10/2019 03/04/19   Noe Gens, PA-C  cetirizine (ZYRTEC) 10 MG tablet Take 1 tablet (10 mg total) by mouth daily. Patient not taking: Reported on 05/10/2019 03/04/19   Noe Gens, PA-C  Evolocumab (REPATHA SURECLICK) [id number] MG/ML SOAJ Inject 140 mg into the skin every 14 (fourteen) days. Patient not taking: Reported on 02/01/2019 08/26/18   Croitoru, Mihai, MD  HYDROcodone-homatropine [facility]) 5-1.5 MG/5ML syrup Take 5 mLs by mouth at bedtime as needed for cough. 02/24/19   Jaynee Eagles, PA-C  ipratropium (ATROVENT) 0.02 % nebulizer solution Take 2.5 mLs (0.5 mg total) by nebulization 4 (four) times daily. 03/04/19   Noe Gens, PA-C  predniSONE (DELTASONE) 20 MG tablet Take 2 tablets daily with breakfast. 02/24/19   Jaynee Eagles, PA-C  sildenafil (REVATIO) 20 MG tablet TAKE 2 TABLETS BY MOUTH AS NEEDED 11/21/17   [provider]     Depression screen [facility] 2/9 05/10/2019 02/01/2019 05/14/2018 03/14/2018 12/25/2017  Decreased Interest 0 0 0 0 0  Down, Depressed, Hopeless 0 0 0 0 0  PHQ - 2 Score 0 0 0 0 0  Altered sleeping - - - - -  Tired, decreased energy - - - - -  Change in appetite - - - - -  Feeling bad or failure about yourself  - - - - -  Trouble concentrating - - - - -  Moving slowly or fidgety/restless - - - - -  Suicidal thoughts - - - - -  PHQ-9 Score - - - - -  Difficult doing work/chores - - - - -     Fall Risk  05/10/2019 02/01/2019 05/14/2018 03/14/2018 12/25/2017  Falls in the past year? 0 0 Yes No No  Number falls in past yr: 0 - 1 - -  Injury with Fall? 0 - Yes - -  Risk for fall due to : - - - - -  Risk for fall  due to: Comment - - - - -  Follow up Falls evaluation completed;Education provided;Falls prevention discussed Falls evaluation completed - - -      PHYSICAL  EXAM: BP 138/84 Comment: per patient  Ht 6' (1.829 m)   Wt 260 lb (117.9 kg)   BMI 35.26 kg/m    Wt Readings from Last 3 Encounters:  05/10/19 260 lb (117.9 kg)  02/01/19 260 lb (117.9 kg)  06/12/18 259 lb 12.8 oz (117.8 kg)     Medicare annual wellness visit, subsequent     Physical Exam

## 2019-05-30 ENCOUNTER — Other Ambulatory Visit: Payer: Self-pay | Admitting: Cardiovascular Disease

## 2019-06-01 NOTE — Telephone Encounter (Signed)
Okay to refill? 

## 2019-06-02 ENCOUNTER — Telehealth: Payer: Self-pay | Admitting: *Deleted

## 2019-06-02 NOTE — Telephone Encounter (Signed)
Virtual Visit Pre-Appointment Phone Call  "(Name), I am calling you today to discuss your upcoming appointment. We are currently trying to limit exposure to the virus that causes COVID-19 by seeing patients at home rather than in the office."  1. "What is the BEST phone number to call the day of the visit?" - include this in appointment notes  2. "Do you have or have access to (through a family member/friend) a smartphone with video capability that we can use for your visit?" a. If yes - list this number in appt notes as "cell" (if different from BEST phone #) and list the appointment type as a VIDEO visit in appointment notes b. If no - list the appointment type as a PHONE visit in appointment notes  3. Confirm consent - "In the setting of the current Covid19 crisis, you are scheduled for a (phone or video) visit with your provider on (date) at (time).  Just as we do with many in-office visits, in order for you to participate in this visit, we must obtain consent.  If you'd like, I can send this to your mychart (if signed up) or email for you to review.  Otherwise, I can obtain your verbal consent now.  All virtual visits are billed to your insurance company just like a normal visit would be.  By agreeing to a virtual visit, we'd like you to understand that the technology does not allow for your provider to perform an examination, and thus may limit your provider's ability to fully assess your condition. If your provider identifies any concerns that need to be evaluated in person, we will make arrangements to do so.  Finally, though the technology is pretty good, we cannot assure that it will always work on either your or our end, and in the setting of a video visit, we may have to convert it to a phone-only visit.  In either situation, we cannot ensure that we have a secure connection.  Are you willing to proceed?" YES  4. Advise patient to be prepared - "Two hours prior to your appointment, go  ahead and check your blood pressure, pulse, oxygen saturation, and your weight (if you have the equipment to check those) and write them all down. When your visit starts, your provider will ask you for this information. If you have an Apple Watch or Kardia device, please plan to have heart rate information ready on the day of your appointment. Please have a pen and paper handy nearby the day of the visit as well."  5. Give patient instructions for MyChart download to smartphone OR [email or web address] as below if video visit (depending on what platform provider is using)  6. Inform patient they will receive a phone call 15 minutes prior to their appointment time (may be from unknown caller ID) so they should be prepared to answer    TELEPHONE CALL NOTE  Samuel Chambers has been deemed a candidate for a follow-up tele-health visit to limit community exposure during the Covid-19 pandemic. I spoke with the patient via phone to ensure availability of phone/video source, confirm preferred email & phone number, and discuss instructions and expectations.  I reminded Samuel Chambers to be prepared with any vital sign and/or heart rhythm information that could potentially be obtained via home monitoring, at the time of his visit. I reminded Samuel Chambers to expect a phone call prior to his visit.  Samuel Barker, Samuel Chambers 06/02/2019 4:13 PM   INSTRUCTIONS  FOR DOWNLOADING THE MYCHART APP TO SMARTPHONE  - The patient must first make sure to have activated MyChart and know their login information - If Apple, go to [employer] and type in MyChart in the search bar and download the app. If Android, ask patient to go to [employer] and type in [place] in the search bar and download the app. The app is free but as with any other app downloads, their phone may require them to verify saved payment information or Apple/Android password.  - The patient will need to then log into the app with their MyChart  username and password, and select  as their healthcare provider to link the account. When it is time for your visit, go to the MyChart app, find appointments, and click Begin Video Visit. Be sure to Select Allow for your device to access the Microphone and Camera for your visit. You will then be connected, and your provider will be with you shortly.  **If they have any issues connecting, or need assistance please contact MyChart service desk [phone])**  **If using a computer, in order to ensure the best quality for their visit they will need to use either of the following Internet Browsers: [employer], or Google Chrome**  IF USING DOXIMITY or DOXY.ME - The patient will receive a link just prior to their visit by text.     FULL LENGTH CONSENT FOR TELE-HEALTH VISIT   I hereby voluntarily request, consent and authorize [facility] and its employed or contracted physicians, physician assistants, nurse practitioners or other licensed health care professionals (the Practitioner), to provide me with telemedicine health care services (the "Services") as deemed necessary by the treating Practitioner. I acknowledge and consent to receive the Services by the Practitioner via telemedicine. I understand that the telemedicine visit will involve communicating with the Practitioner through live audiovisual communication technology and the disclosure of certain medical information by electronic transmission. I acknowledge that I have been given the opportunity to request an in-person assessment or other available alternative prior to the telemedicine visit and am voluntarily participating in the telemedicine visit.  I understand that I have the right to withhold or withdraw my consent to the use of telemedicine in the course of my care at any time, without affecting my right to future care or treatment, and that the Practitioner or I may terminate the telemedicine visit at any  time. I understand that I have the right to inspect all information obtained and/or recorded in the course of the telemedicine visit and may receive copies of available information for a reasonable fee.  I understand that some of the potential risks of receiving the Services via telemedicine include:  Marland Kitchen Delay or interruption in medical evaluation due to technological equipment failure or disruption; . Information transmitted may not be sufficient (e.g. poor resolution of images) to allow for appropriate medical decision making by the Practitioner; and/or  . In rare instances, security protocols could fail, causing a breach of personal health information.  Furthermore, I acknowledge that it is my responsibility to provide information about my medical history, conditions and care that is complete and accurate to the best of my ability. I acknowledge that Practitioner's advice, recommendations, and/or decision may be based on factors not within their control, such as incomplete or inaccurate data provided by me or distortions of diagnostic images or specimens that may result from electronic transmissions. I understand that the practice of medicine is not an exact science and  that Practitioner makes no warranties or guarantees regarding treatment outcomes. I acknowledge that I will receive a copy of this consent concurrently upon execution via email to the email address I last provided but may also request a printed copy by calling the office of [facility].    I understand that my insurance will be billed for this visit.   I have read or had this consent read to me. . I understand the contents of this consent, which adequately explains the benefits and risks of the Services being provided via telemedicine.  . I have been provided ample opportunity to ask questions regarding this consent and the Services and have had my questions answered to my satisfaction. . I give my informed consent for the services  to be provided through the use of telemedicine in my medical care  By participating in this telemedicine visit I agree to the above.

## 2019-06-11 ENCOUNTER — Telehealth (INDEPENDENT_AMBULATORY_CARE_PROVIDER_SITE_OTHER): Payer: Medicare Other | Admitting: Cardiovascular Disease

## 2019-06-11 DIAGNOSIS — I2510 Atherosclerotic heart disease of native coronary artery without angina pectoris: Secondary | ICD-10-CM | POA: Diagnosis not present

## 2019-06-11 DIAGNOSIS — Z9861 Coronary angioplasty status: Secondary | ICD-10-CM | POA: Diagnosis not present

## 2019-06-11 DIAGNOSIS — I10 Essential (primary) hypertension: Secondary | ICD-10-CM

## 2019-06-11 DIAGNOSIS — R7309 Other abnormal glucose: Secondary | ICD-10-CM | POA: Diagnosis not present

## 2019-06-11 DIAGNOSIS — Z6835 Body mass index (BMI) 35.0-35.9, adult: Secondary | ICD-10-CM

## 2019-06-11 DIAGNOSIS — E785 Hyperlipidemia, unspecified: Secondary | ICD-10-CM | POA: Diagnosis not present

## 2019-06-11 MED ORDER — HYDROCHLOROTHIAZIDE 25 MG PO TABS: ORAL_TABLET | ORAL | 3 refills | Status: DC

## 2019-06-11 MED ORDER — CLOPIDOGREL BISULFATE 75 MG PO TABS: ORAL_TABLET | ORAL | 3 refills | Status: DC

## 2019-06-11 NOTE — Patient Instructions (Signed)

## 2019-06-11 NOTE — Progress Notes (Signed)
Virtual Visit via Telephone Note   This visit type was conducted due to national recommendations for restrictions regarding the COVID-19 Pandemic (e.g. social distancing) in an effort to limit this patient's exposure and mitigate transmission in our community.  Due to his co-morbid illnesses, this patient is at least at moderate risk for complications without adequate follow up.  This format is felt to be most appropriate for this patient at this time.  The patient did not have access to video technology/had technical difficulties with video requiring transitioning to audio format only (telephone).  All issues noted in this document were discussed and addressed.  No physical exam could be performed with this format.  Please refer to the patient's chart for his  consent to telehealth for [facility].   Date:  06/11/2019   ID:  Samuel Chambers, DOB May 11, 1951, MRN ID:[id number]  Patient Location: Home Provider Location: Other:  [facility]  PCP:  Horald Pollen, MD  Cardiologist:  Sanda Klein, MD  Electrophysiologist:  None   Evaluation Performed:  Follow-Up Visit  Chief Complaint:  CAD  History of Present Illness:    Samuel Chambers is a 68 y.o. male with history of coronary artery disease, diet-controlled diabetes mellitus, hypercholesterolemia, essential hypertension, fibromyalgia.  He feels pretty well right now.  He has no problems with angina or dyspnea either at rest or with activity.  He denies palpitations, dizziness, syncope, claudication, lower extremity edema or focal neurological complaints.  He takes clopidogrel chronically and has not had any serious bleeding complications.    He had a persistent cough in the month of August, but work-up including coronavirus testing and chest x-ray was negative.  He has gained about 15 pounds due to the coronavirus pandemic restrictions.  He was on a very ambitious diet last year, but this is fallen by the wayside.   He did develop diabetes mellitus when he was unable to be active due to orthopedic problems, but following his right hip replacement in August 2019 he has been able to manage his diabetes with diet only.  His most recent hemoglobin A1c was 6.1% (this was a year ago).  He has not had his lipids rechecked since November 2019 when his LDL cholesterol was 131.  He tells me that he has tried "every single statin there is" (my records show that he has failed atorvastatin, simvastatin, rosuvastatin, but I do not see pravastatin listed).  They all made his muscles ache so badly that he could not move.  Zetia caused similar problems.  He tried taking cholestyramine but this did not help his lipid profile and also caused side effects.  He was prescribed Repatha but even after applying for assistance was unable to afford its cost.  (He would have had to pay $390 a month).  I offered referral to the lipid clinic or enrollment in a clinical trial, but he is not interested.  In general he shows a fairly firm mistrust for scientific recommendations.  He has a blood pressure cuff at home but does not use it.  He only checks his blood pressure when he feels poorly and he can tell if his blood pressure is abnormal.  He has not felt the need to check his blood pressure in months.  His blood pressure wa 138/84 on May 10, 2019 when he had a telephone visit with his PCP  He thinks the coronavirus pandemic is a hoax.  He does not believe that masks work.  He  goes to church gatherings weekly and does not wear a mask there.  He only wears a mask "if he has to".  The patient does not have symptoms concerning for COVID-19 infection (fever, chills, cough, or new shortness of breath).    Past Medical History:  Diagnosis Date  . Adenomatous colon polyp 03/2005  . Anxiety   . CAD S/P percutaneous coronary angioplasty 2008   PCI to circumflex with a Promus DES 2.5 mm x 8 mm  . Degenerative joint disease   . Diverticulosis    . Fibromyalgia   . GERD (gastroesophageal reflux disease)   . Hyperlipidemia    statin intolerant  . Hypertension   . Internal hemorrhoids   . Obesity, Class II, BMI 35-39.9   . PONV (postoperative nausea and vomiting)    Past Surgical History:  Procedure Laterality Date  . CHOLECYSTECTOMY N/A 02/18/2013   Procedure: LAPAROSCOPIC CHOLECYSTECTOMY WITH INTRAOPERATIVE CHOLANGIOGRAM;  Surgeon: Pedro Earls, MD;  Location: [facility];  Service: General;  Laterality: N/A;  . COLONOSCOPY    . CORONARY ANGIOPLASTY WITH STENT PLACEMENT  04/30/2007   2.5 mm Promus stent that was to 95% Circ;had 60-70% lesion to RCA  . DOPPLER ECHOCARDIOGRAPHY  05/27/2002   [facility]normal EF 55-66%,  . HEEL SPUR SURGERY    . HEMORRHOID SURGERY    . hip surgery  about 10 to 12 years ago   complete  . Holter Monitor  04/07/2007   sinus tachy.;  . KNEE ARTHROSCOPY  03/27/2012   Procedure: ARTHROSCOPY KNEE;  Surgeon: Gearlean Alf, MD;  Location: [facility];  Service: Orthopedics;  Laterality: Left;  . NM MYOCAR PERF WALL MOTION  12/19/2011   EXERCISED FOR 8-1/2 MINUTES RECHING 10 METABOLIC EQUIVALENTS. NO EVIDENCE  OF ISCHEMIA OR INFARCTION . EF 71%  . renal dopplers  04/08/2007   relatively normal  . TOTAL HIP ARTHROPLASTY Right 03/16/2018   Procedure: RIGHT TOTAL HIP ARTHROPLASTY ANTERIOR APPROACH;  Surgeon: Gaynelle Arabian, MD;  Location: [facility];  Service: Orthopedics;  Laterality: Right;  . TOTAL KNEE ARTHROPLASTY Left 09/04/2012   Procedure: TOTAL KNEE ARTHROPLASTY;  Surgeon: Gearlean Alf, MD;  Location: [facility];  Service: Orthopedics;  Laterality: Left;     Current Meds  Medication Sig  . clopidogrel (PLAVIX) 75 MG tablet TAKE 1 TABLET(75 MG) BY MOUTH DAILY  . DULoxetine (CYMBALTA) 60 MG capsule Take 1 capsule (60 mg total) by mouth daily.  . hydrochlorothiazide (HYDRODIURIL) 25 MG tablet TAKE 1 TABLET(25 MG) BY MOUTH DAILY  . losartan (COZAAR) 100 MG tablet TAKE 1 TABLET BY MOUTH DAILY  . metoprolol  succinate (TOPROL-XL) 50 MG 24 hr tablet TAKE 1 TABLET BY MOUTH DAILY. TAKE WITH OR IMMEDIATELY FOLLOWING A MEAL  . sildenafil (REVATIO) 20 MG tablet TAKE 2 TABLETS BY MOUTH AS NEEDED     Allergies:   Gabapentin, Crestor [rosuvastatin], Oxycodone, Prednisone, Valsartan, and Warfarin and related   Social History   Tobacco Use  . Smoking status: Never Smoker  . Smokeless tobacco: Never Used  Substance Use Topics  . Alcohol use: No  . Drug use: No     Family Hx: The patient's family history includes Breast cancer in his mother; Diabetes in his mother; Prostate cancer in his father. There is no history of Colon cancer.  ROS:   Please see the history of present illness.     All other systems reviewed and are negative.   Prior CV studies:   The following studies were reviewed  today  Labs/Other Tests and Data Reviewed:    EKG:  No ECG reviewed.  Recent Labs: 06/12/2018: ALT 21; BUN 12; Creatinine, Ser 1.15; Potassium 3.9; Sodium 140   Recent Lipid Panel Lab Results  Component Value Date/Time   CHOL 195 06/12/2018 09:39 AM   TRIG 124 06/12/2018 09:39 AM   HDL 39 (L) 06/12/2018 09:39 AM   CHOLHDL 5.0 06/12/2018 09:39 AM   CHOLHDL 4.9 09/21/2015 02:10 PM   LDLCALC 131 (H) 06/12/2018 09:39 AM    Wt Readings from Last 3 Encounters:  06/11/19 262 lb (118.8 kg)  05/10/19 260 lb (117.9 kg)  02/01/19 260 lb (117.9 kg)     Objective:    Vital Signs:  Ht 6' (1.829 m)   Wt 262 lb (118.8 kg)   BMI 35.53 kg/m    VITAL SIGNS:  reviewed Unable to examine  ASSESSMENT & PLAN:    1. CAD: Asymptomatic.  He had a stent in the left circumflex coronary artery in 2008 and has not had coronary events since then.  He does not smoke.  Diabetes is diet-controlled.  He refuses lipid-lowering therapy.  He is taking clopidogrel and the beta-blocker. 2. HLP: Target LDL less than 70.  He does not want to retry any statin although I offered to send a prescription for low-dose pravastatin  which I suspect would be best tolerated.  He does not want to consider PCSK9 inhibitors due to the cost.  He refuses referral to the lipid clinic and does not have any interest in clinical trial enrollment.  I expressed my concern that he is at risk for progression of his coronary disease and also getting other vascular problems, but he told me he is more interested in quality of life rather than length of life.  I pointed out that disability from heart disease or strokes may impair his quality of life. He thinks he is doing well right now and does not want to change his treatment.  Consequently, rechecking his lipid profile would serve little purpose. 3. HTN: His blood pressure was mildly elevated when it was checked last month, but it is hard to make any decisions about medication dose adjustments based on just the one reading.  No changes were made. 4. Obesity: He is no longer committed to his efforts at weight loss.  He is now in the severely obese range.  He reports that his glucose is well controlled, but I am not sure how he knows this since he does not check his glucose levels.  COVID-19 Education: The signs and symptoms of COVID-19 were discussed with the patient and how to seek care for testing (follow up with PCP or arrange E-visit).  The importance of social distancing was discussed today.  He does not believe that this advice is necessary.  Time:   Today, I have spent 19 minutes with the patient with telehealth technology discussing the above problems.     Medication Adjustments/Labs and Tests Ordered: Current medicines are reviewed at length with the patient today.  Concerns regarding medicines are outlined above.   Tests Ordered: No orders of the defined types were placed in this encounter.   Medication Changes: No orders of the defined types were placed in this encounter.   Follow Up:  In Person 1 year  Signed, Sanda Klein, MD  06/11/2019 10:31 AM    [place]

## 2019-07-09 ENCOUNTER — Other Ambulatory Visit: Payer: Self-pay

## 2019-07-09 MED ORDER — METOPROLOL SUCCINATE ER 50 MG PO TB24: ORAL_TABLET | ORAL | 2 refills | Status: DC

## 2019-07-12 ENCOUNTER — Other Ambulatory Visit: Payer: Self-pay | Admitting: Physician Assistant

## 2019-07-12 DIAGNOSIS — F411 Generalized anxiety disorder: Secondary | ICD-10-CM

## 2019-07-12 NOTE — Telephone Encounter (Signed)
Requested medication (s) are due for refill today: yes  Requested medication (s) are on the active medication list: yes  Last refill:  04/16/2019  Future visit scheduled: no  Notes to clinic: Last filled by different provider than pcp Review for refill   Requested Prescriptions  Pending Prescriptions Disp Refills   DULoxetine (CYMBALTA) 60 MG capsule [Pharmacy Med Name: DULOXETINE DR 60MG  CAPSULES] 90 capsule 3    Sig: TAKE 1 CAPSULE(60 MG) BY MOUTH DAILY      Psychiatry: Antidepressants - SNRI Passed - 07/12/2019  3:54 AM      Passed - Last BP in normal range    BP Readings from Last 1 Encounters:  05/10/19 138/84          Passed - Valid encounter within last 6 months    Recent Outpatient Visits           2 months ago Medicare annual wellness visit, subsequent   Primary Care at [facility], Ines Bloomer, MD   5 months ago Cough   Primary Care at [place], Ines Bloomer, MD   1 year ago Elevated hemoglobin A1c   Primary Care at [facility], Gelene Mink, PA-C   1 year ago Preoperative clearance   Primary Care at [facility], Gelene Mink, PA-C   1 year ago Cough   [facility] at [facility], Ranell Patrick, MD

## 2019-07-13 NOTE — Telephone Encounter (Signed)
Patient need an office visit to f/u on anxiety for any refills

## 2019-07-19 ENCOUNTER — Telehealth (INDEPENDENT_AMBULATORY_CARE_PROVIDER_SITE_OTHER): Payer: Medicare Other | Admitting: Emergency Medicine

## 2019-07-19 ENCOUNTER — Other Ambulatory Visit: Payer: Self-pay

## 2019-07-19 ENCOUNTER — Encounter: Payer: Self-pay | Admitting: Emergency Medicine

## 2019-07-19 DIAGNOSIS — Z9861 Coronary angioplasty status: Secondary | ICD-10-CM

## 2019-07-19 DIAGNOSIS — F411 Generalized anxiety disorder: Secondary | ICD-10-CM | POA: Diagnosis not present

## 2019-07-19 DIAGNOSIS — I2510 Atherosclerotic heart disease of native coronary artery without angina pectoris: Secondary | ICD-10-CM | POA: Diagnosis not present

## 2019-07-19 MED ORDER — DULOXETINE HCL 60 MG PO CPEP: 60.0000 mg | ORAL_CAPSULE | Freq: Every day | ORAL | 3 refills | Status: DC

## 2019-07-19 NOTE — Progress Notes (Signed)
Telemedicine Encounter- SOAP NOTE Established Patient  This telephone encounter was conducted with the patient's (or proxy's) verbal consent via audio telecommunications: yes/no: Yes Patient was instructed to have this encounter in a suitably private space; and to only have persons present to whom they give permission to participate. In addition, patient identity was confirmed by use of name plus two identifiers (DOB and address).  I discussed the limitations, risks, security and privacy concerns of performing an evaluation and management service by telephone and the availability of in person appointments. I also discussed with the patient that there may be a patient responsible charge related to this service. The patient expressed understanding and agreed to proceed.  I spent a total of TIME; 0 MIN TO 60 MIN: 15 minutes talking with the patient or their proxy.  Chief Complaint  Patient presents with  . Medication Refill    Cymbalta    Subjective   Samuel Chambers is a 68 y.o. male established patient. Telephone visit today for medication refill.  Requesting duloxetine 60 mg daily for 90 days.  Has no complaints or medical concerns today.   HPI   Patient Active Problem List   Diagnosis Date Noted  . OA (osteoarthritis) of hip 03/16/2018  . Elevated hemoglobin A1c 03/14/2018  . Severe obesity (BMI 35.0-35.9 with comorbidity) ([place]) 08/04/2014  . Hyperlipidemia with target LDL less than 70; statin intolerant 04/29/2013  . CAD S/P percutaneous coronary angioplasty   . S/P laparoscopic cholecystectomy July 2014 02/18/2013  . Gallstones 01/28/2013  . OA (osteoarthritis) of knee 09/04/2012  . Essential hypertension 02/24/2007  . GERD 02/24/2007  . DEGENERATIVE JOINT DISEASE, GENERALIZED 02/24/2007    Past Medical History:  Diagnosis Date  . Adenomatous colon polyp 03/2005  . Anxiety   . CAD S/P percutaneous coronary angioplasty 2008   PCI to circumflex with a Promus DES 2.5  mm x 8 mm  . Degenerative joint disease   . Diverticulosis   . Fibromyalgia   . GERD (gastroesophageal reflux disease)   . Hyperlipidemia    statin intolerant  . Hypertension   . Internal hemorrhoids   . Obesity, Class II, BMI 35-39.9   . PONV (postoperative nausea and vomiting)     Current Outpatient Medications  Medication Sig Dispense Refill  . benzonatate (TESSALON) 100 MG capsule Take 1 capsule (100 mg total) by mouth every 8 (eight) hours. 21 capsule 0  . cetirizine (ZYRTEC) 10 MG tablet Take 1 tablet (10 mg total) by mouth daily. 30 tablet 0  . clopidogrel (PLAVIX) 75 MG tablet TAKE 1 TABLET(75 MG) BY MOUTH DAILY 90 tablet 3  . DULoxetine (CYMBALTA) 60 MG capsule Take 1 capsule (60 mg total) by mouth daily. 90 capsule 3  . hydrochlorothiazide (HYDRODIURIL) 25 MG tablet TAKE 1 TABLET(25 MG) BY MOUTH DAILY 90 tablet 3  . losartan (COZAAR) 100 MG tablet TAKE 1 TABLET BY MOUTH DAILY 90 tablet 3  . metoprolol succinate (TOPROL-XL) 50 MG 24 hr tablet TAKE 1 TABLET BY MOUTH DAILY. TAKE WITH OR IMMEDIATELY FOLLOWING A MEAL 90 tablet 2  . sildenafil (REVATIO) 20 MG tablet TAKE 2 TABLETS BY MOUTH AS NEEDED  11  . albuterol (VENTOLIN HFA) 108 (90 Base) MCG/ACT inhaler Inhale 1-2 puffs into the lungs every 6 (six) hours as needed for wheezing or shortness of breath. 18 g 0  . azithromycin (ZITHROMAX) 250 MG tablet Sig as indicated 6 tablet 0  . Evolocumab (REPATHA SURECLICK) [id number] MG/ML SOAJ Inject 140 mg  into the skin every 14 (fourteen) days. (Patient not taking: Reported on 02/01/2019) 2 pen 6  . HYDROcodone-homatropine (HYCODAN) 5-1.5 MG/5ML syrup Take 5 mLs by mouth at bedtime as needed for cough. 50 mL 0  . ipratropium (ATROVENT) 0.02 % nebulizer solution Take 2.5 mLs (0.5 mg total) by nebulization 4 (four) times daily. 75 mL 12  . predniSONE (DELTASONE) 20 MG tablet Take 2 tablets daily with breakfast. 10 tablet 0   No current facility-administered medications for this visit.     Allergies  Allergen Reactions  . Gabapentin     Pt reports increased heart rate and blood pressure  . Crestor [Rosuvastatin] Other (See Comments)    myalgias  . Oxycodone Other (See Comments)    Hallucinations-ok in small doses  . Prednisone Other (See Comments)    Increased BP and HR in high doses  . Valsartan Other (See Comments)    Made patient feel tired and no energy  . Warfarin And Related Other (See Comments)    Fast heart beat, went down hill, felt like he was dying    Social History   Socioeconomic History  . Marital status: Married    Spouse name: Not on file  . Number of children: 1  . Years of education: Not on file  . Highest education level: Not on file  Occupational History    Employer: [employer]  Tobacco Use  . Smoking status: Never Smoker  . Smokeless tobacco: Never Used  Substance and Sexual Activity  . Alcohol use: No  . Drug use: No  . Sexual activity: Not on file  Other Topics Concern  . Not on file  Social History Narrative   Very little 0-2 drinks a week   Social Determinants of Health   Financial Resource Strain:   . Difficulty of Paying Living Expenses: Not on file  Food Insecurity:   . Worried About Charity fundraiser in the Last Year: Not on file  . Ran Out of Food in the Last Year: Not on file  Transportation Needs:   . Lack of Transportation (Medical): Not on file  . Lack of Transportation (Non-Medical): Not on file  Physical Activity:   . Days of Exercise per Week: Not on file  . Minutes of Exercise per Session: Not on file  Stress:   . Feeling of Stress : Not on file  Social Connections:   . Frequency of Communication with Friends and Family: Not on file  . Frequency of Social Gatherings with Friends and Family: Not on file  . Attends Religious Services: Not on file  . Active Member of Clubs or Organizations: Not on file  . Attends Archivist Meetings: Not on file  . Marital Status: Not on file  Intimate  Partner Violence:   . Fear of Current or Ex-Partner: Not on file  . Emotionally Abused: Not on file  . Physically Abused: Not on file  . Sexually Abused: Not on file    Review of Systems  Constitutional: Negative.  Negative for chills and fever.  HENT: Negative.  Negative for congestion and sore throat.   Respiratory: Negative.  Negative for cough and shortness of breath.   Cardiovascular: Negative.  Negative for chest pain and palpitations.  Gastrointestinal: Negative.  Negative for abdominal pain, diarrhea, nausea and vomiting.  Genitourinary: Negative.  Negative for dysuria and hematuria.  Musculoskeletal: Negative.   Skin: Negative.  Negative for rash.  Neurological: Negative for dizziness and headaches.  All  other systems reviewed and are negative.   Objective  Alert and oriented x3 in no apparent respiratory distress Vitals as reported by the patient: Today's Vitals   07/19/19 1731  Weight: 265 lb (120.2 kg)  Height: 6' (1.829 m)    Ilhan was seen today for medication refill.  Diagnoses and all orders for this visit:  Anxiety state -     DULoxetine (CYMBALTA) 60 MG capsule; Take 1 capsule (60 mg total) by mouth daily.   Clinically stable.  No medical concerns identified during this visit. Office visit in the next 3 to 6 months.   I discussed the assessment and treatment plan with the patient. The patient was provided an opportunity to ask questions and all were answered. The patient agreed with the plan and demonstrated an understanding of the instructions.   The patient was advised to call back or seek an in-person evaluation if the symptoms worsen or if the condition fails to improve as anticipated.  I provided 15 minutes of non-face-to-face time during this encounter.  Horald Pollen, MD  Primary Care at [facility]

## 2019-09-03 ENCOUNTER — Other Ambulatory Visit: Payer: Self-pay

## 2019-09-06 ENCOUNTER — Other Ambulatory Visit: Payer: Self-pay

## 2019-09-07 ENCOUNTER — Telehealth: Payer: Self-pay | Admitting: Cardiovascular Disease

## 2019-09-07 MED ORDER — LOSARTAN POTASSIUM 100 MG PO TABS: 100.0000 mg | ORAL_TABLET | Freq: Every day | ORAL | 2 refills | Status: DC

## 2019-09-07 NOTE — Telephone Encounter (Signed)
rx sent to pharmacy

## 2019-09-07 NOTE — Telephone Encounter (Signed)
New message   Patient needs a new prescription for losartan (COZAAR) 100 MG tablet and wants it sent to [facility], [place] [facility] [address]

## 2019-09-19 IMAGING — DX CHEST - 2 VIEW
2 series · 2 of 2 positions shown · non-contrast
Comparison: Multiple prior chest films. The most recent is
12/25/2017.

CLINICAL DATA: One month history of cough and shortness of breath.

EXAM:
CHEST - 2 VIEW

[chest pa]
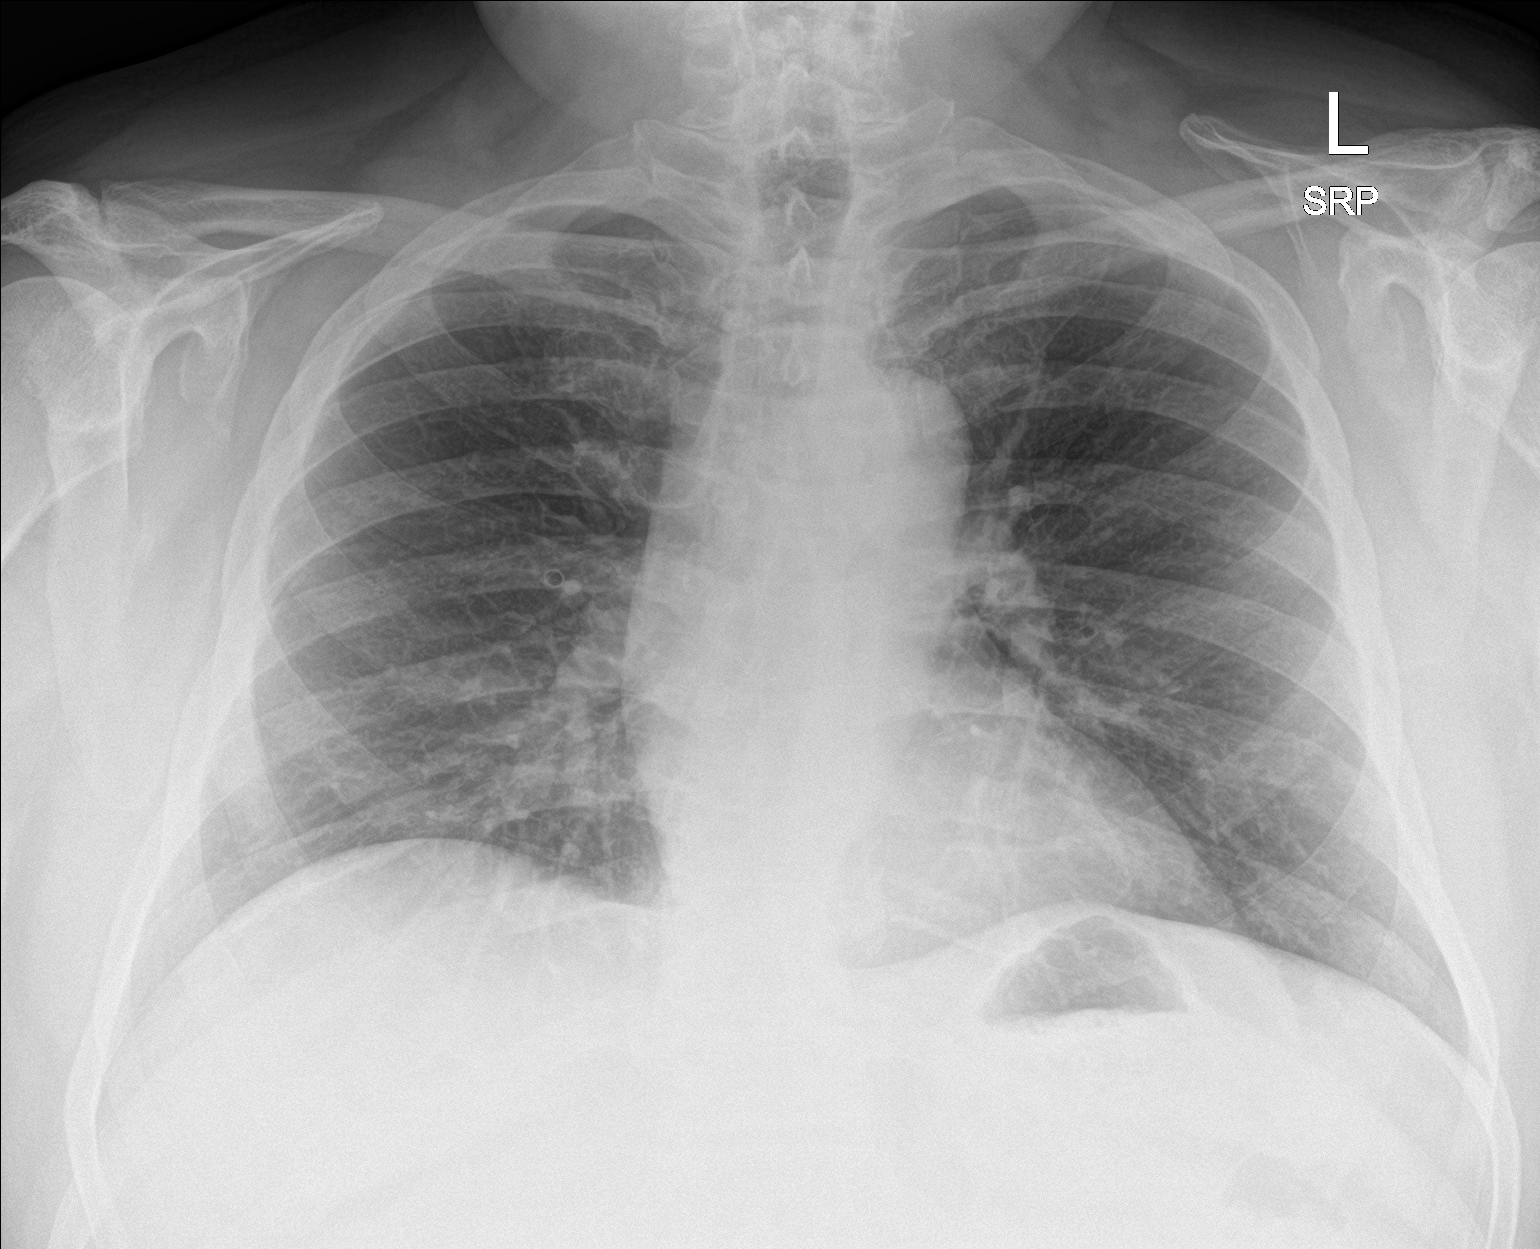

[chest lat]
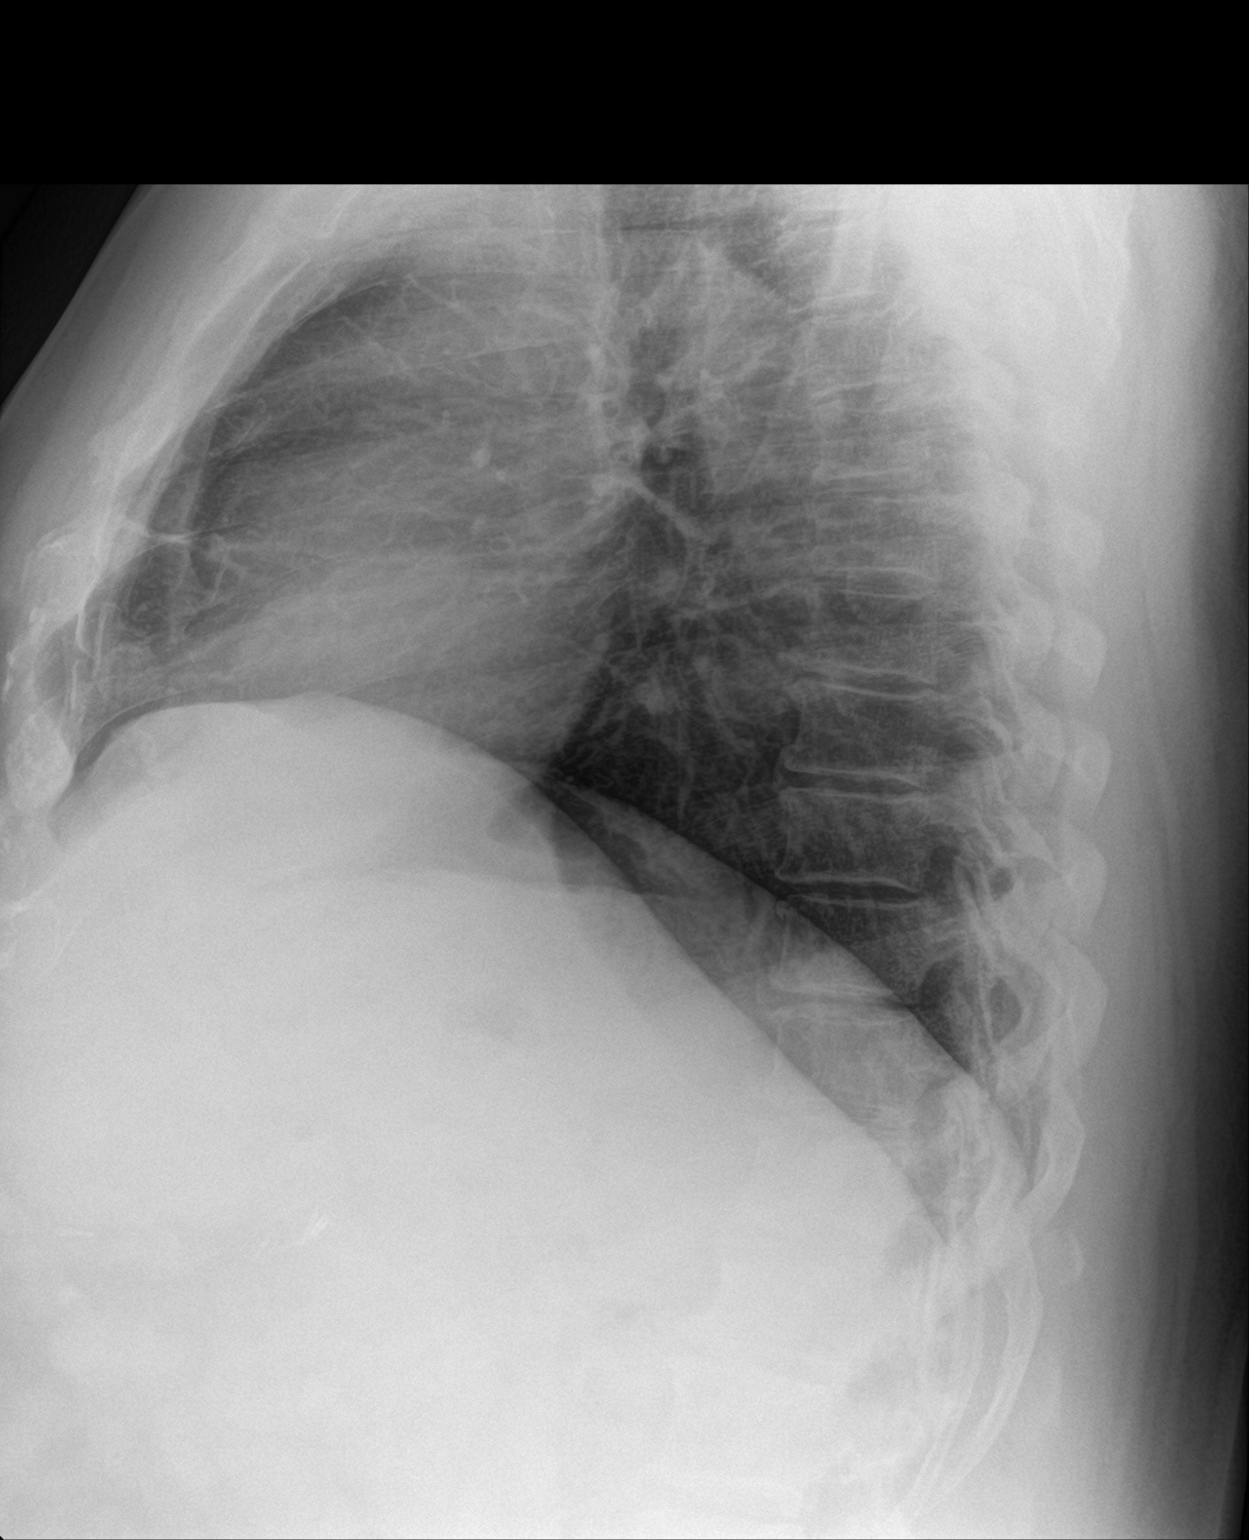

[2 of 2 positions shown; findings below may reference images not displayed]

FINDINGS: The heart is normal in size. The mediastinal and hilar contours
appear normal in stable. There is mild tortuosity of the thoracic
aorta. The lungs are clear. No pleural effusions. No worrisome
pulmonary lesions. The bony thorax is intact.
IMPRESSION: No acute cardiopulmonary findings. No change since prior chest film.

## 2019-09-20 DIAGNOSIS — M67872 Other specified disorders of synovium, left ankle and foot: Secondary | ICD-10-CM | POA: Diagnosis not present

## 2019-09-20 DIAGNOSIS — M898X7 Other specified disorders of bone, ankle and foot: Secondary | ICD-10-CM | POA: Diagnosis not present

## 2019-09-20 DIAGNOSIS — M79672 Pain in left foot: Secondary | ICD-10-CM | POA: Diagnosis not present

## 2019-09-20 DIAGNOSIS — M6702 Short Achilles tendon (acquired), left ankle: Secondary | ICD-10-CM | POA: Diagnosis not present

## 2019-12-22 DIAGNOSIS — M1731 Unilateral post-traumatic osteoarthritis, right knee: Secondary | ICD-10-CM | POA: Diagnosis not present

## 2019-12-22 DIAGNOSIS — M7061 Trochanteric bursitis, right hip: Secondary | ICD-10-CM | POA: Diagnosis not present

## 2019-12-23 ENCOUNTER — Encounter: Payer: Self-pay | Admitting: Emergency Medicine

## 2019-12-23 ENCOUNTER — Other Ambulatory Visit: Payer: Self-pay

## 2019-12-23 ENCOUNTER — Ambulatory Visit (INDEPENDENT_AMBULATORY_CARE_PROVIDER_SITE_OTHER): Payer: Medicare Other | Admitting: Emergency Medicine

## 2019-12-23 VITALS — BP 133/82 | HR 71 | Temp 98.2°F | Resp 16 | Ht 72.0 in | Wt 272.0 lb

## 2019-12-23 DIAGNOSIS — E785 Hyperlipidemia, unspecified: Secondary | ICD-10-CM

## 2019-12-23 DIAGNOSIS — I2510 Atherosclerotic heart disease of native coronary artery without angina pectoris: Secondary | ICD-10-CM

## 2019-12-23 DIAGNOSIS — R011 Cardiac murmur, unspecified: Secondary | ICD-10-CM | POA: Diagnosis not present

## 2019-12-23 DIAGNOSIS — E74819 Disorders of glucose transport, unspecified: Secondary | ICD-10-CM | POA: Diagnosis not present

## 2019-12-23 DIAGNOSIS — I10 Essential (primary) hypertension: Secondary | ICD-10-CM | POA: Diagnosis not present

## 2019-12-23 DIAGNOSIS — Z9861 Coronary angioplasty status: Secondary | ICD-10-CM

## 2019-12-23 DIAGNOSIS — Z01818 Encounter for other preprocedural examination: Secondary | ICD-10-CM | POA: Diagnosis not present

## 2019-12-23 DIAGNOSIS — Z87898 Personal history of other specified conditions: Secondary | ICD-10-CM | POA: Diagnosis not present

## 2019-12-23 LAB — HEMOGLOBIN A1C
Est. average glucose Bld gHb Est-mCnc: 128 mg/dL
Hgb A1c MFr Bld: 6.1 % — ABNORMAL HIGH (ref 4.8–5.6)

## 2019-12-23 LAB — COMPREHENSIVE METABOLIC PANEL
ALT: 29 IU/L (ref 0–44)
AST: 28 IU/L (ref 0–40)
Albumin/Globulin Ratio: 1.8 (ref 1.2–2.2)
Albumin: 4.4 g/dL (ref 3.8–4.8)
Alkaline Phosphatase: 65 IU/L (ref 48–121)
BUN/Creatinine Ratio: 17 (ref 10–24)
BUN: 16 mg/dL (ref 8–27)
Bilirubin Total: 0.4 mg/dL (ref 0.0–1.2)
CO2: 24 mmol/L (ref 20–29)
Calcium: 9.6 mg/dL (ref 8.6–10.2)
Chloride: 101 mmol/L (ref 96–106)
Creatinine, Ser: 0.95 mg/dL (ref 0.76–1.27)
GFR calc Af Amer: 95 mL/min/{1.73_m2} (ref >59)
GFR calc non Af Amer: 82 mL/min/{1.73_m2} (ref >59)
Globulin, Total: 2.5 g/dL (ref 1.5–4.5)
Glucose: 94 mg/dL (ref 65–99)
Potassium: 3.9 mmol/L (ref 3.5–5.2)
Sodium: 142 mmol/L (ref 134–144)
Total Protein: 6.9 g/dL (ref 6.0–8.5)

## 2019-12-23 LAB — CBC WITH DIFFERENTIAL/PLATELET
Basophils Absolute: 0.1 10*3/uL (ref 0.0–0.2)
Basos: 1 %
EOS (ABSOLUTE): 0.1 10*3/uL (ref 0.0–0.4)
Eos: 1 %
Hematocrit: 45.1 % (ref 37.5–51.0)
Hemoglobin: 15 g/dL (ref 13.0–17.7)
Immature Grans (Abs): 0.1 10*3/uL (ref 0.0–0.1)
Immature Granulocytes: 1 %
Lymphocytes Absolute: 2.5 10*3/uL (ref 0.7–3.1)
Lymphs: 22 %
MCH: 29.3 pg (ref 26.6–33.0)
MCHC: 33.3 g/dL (ref 31.5–35.7)
MCV: 88 fL (ref 79–97)
Monocytes Absolute: 0.7 10*3/uL (ref 0.1–0.9)
Monocytes: 6 %
Neutrophils Absolute: 8.2 10*3/uL — ABNORMAL HIGH (ref 1.4–7.0)
Neutrophils: 69 %
Platelets: 244 10*3/uL (ref 150–450)
RBC: 5.12 x10E6/uL (ref 4.14–5.80)
RDW: 14 % (ref 11.6–15.4)
WBC: 11.7 10*3/uL — ABNORMAL HIGH (ref 3.4–10.8)

## 2019-12-23 NOTE — Progress Notes (Signed)
Subjective:    Samuel Chambers is a 69 y.o. male who presents to the office today for a preoperative consultation at the request of orthopedic surgeon surgeon  who plans on performing right knee replacement 6 weeks from today.  This consultation is requested for the specific conditions prompting preoperative evaluation: Hypertension and coronary artery disease. Planned anesthesia: general. The patient has the following known anesthesia issues: No issues. Patients bleeding risk: On Plavix. Patient does not have objections to receiving blood products if needed.  The following portions of the patient's history were reviewed and updated as appropriate: allergies, current medications, past family history, past medical history, past social history, past surgical history and problem list.  Review of Systems A comprehensive review of systems was negative.    Objective:    BP 133/82   Pulse 71   Temp 98.2 F (36.8 C) (Temporal)   Resp 16   Ht 6' (1.829 m)   Wt 272 lb (123.4 kg)   SpO2 96%   BMI 36.89 kg/m   General Appearance:    Alert, cooperative, no distress, appears stated age  Head:    Normocephalic, without obvious abnormality, atraumatic  Eyes:    PERRL, conjunctiva/corneas clear, EOM's intact      Nose:   Nares normal, septum midline, mucosa normal, no drainage    or sinus tenderness  Throat:   Lips, mucosa, and tongue normal; teeth and gums normal  Neck:   Supple, symmetrical, trachea midline, no adenopathy;       thyroid:  No enlargement/tenderness/nodules; no carotid   bruit or JVD  Back:     Symmetric, no curvature, ROM normal, no CVA tenderness  Lungs:     Clear to auscultation bilaterally, respirations unlabored  Chest wall:    No tenderness or deformity  Heart:    Regular rate and rhythm, S1 and S2 normal, systolic murmur 3/6 best heard at aortic area, no rub or gallop  Abdomen:     Soft, non-tender, bowel sounds active all four quadrants,    no masses, no organomegaly         Extremities:   Extremities normal, atraumatic, no cyanosis or edema  Pulses:   2+ and symmetric all extremities  Skin:   Skin color, texture, turgor normal, no rashes or lesions  Lymph nodes:   Cervical, supraclavicular, and axillary nodes normal  Neurologic:   CNII-XII intact. Normal strength, sensation and reflexes      throughout    Predictors of intubation difficulty:  Morbid obesity? yes   Anatomically abnormal facies? no  Prominent incisors? no  Receding mandible? no  Short, thick neck? no  Neck range of motion: normal   Lab Review     Assessment:      69 y.o. male with planned surgery as above.   Known risk factors for perioperative complications: Coronary disease and hypertension.  Suspected valvular heart disease as well. Difficulty with intubation is not anticipated.  Cardiac Risk Estimation: Moderate to high risk  Current medications which may produce withdrawal symptoms if withheld perioperatively: None   Plan:    1. Preoperative workup as follows hemoglobin, hematocrit, electrolytes, creatinine, glucose, liver function studies. 2. Change in medication regimen before surgery: As per cardiology.  Plavix will need to be stopped. 3. Prophylaxis for cardiac events with perioperative beta-blockers: should be considered, specific regimen per anesthesia. 4. Invasive hemodynamic monitoring perioperatively: at the discretion of anesthesiologist. 5. Deep vein thrombosis prophylaxis postoperatively:regimen to be chosen by surgical team. 6.  Surveillance for postoperative MI with ECG immediately postoperatively and on postoperative days 1 and 2 AND troponin levels 24 hours postoperatively and on day 4 or hospital discharge (whichever comes first): at the discretion of anesthesiologist. 7. Other measures: Needs cardiology evaluation for clearance

## 2019-12-23 NOTE — Patient Instructions (Addendum)
Needs to follow-up with cardiologist for cardiology clearance.    If you have lab work done today you will be contacted with your lab results within the next 2 weeks.  If you have not heard from [place] then please contact us. The fastest way to get your results is to register for My Chart.   IF you received an x-ray today, you will receive an invoice from [facility]. Please contact [facility] at [phone] with questions or concerns regarding your invoice.   IF you received labwork today, you will receive an invoice from [facility]. Please contact [facility] at [phone] with questions or concerns regarding your invoice.   Our billing staff will not be able to assist you with questions regarding bills from these companies.  You will be contacted with the lab results as soon as they are available. The fastest way to get your results is to activate your My Chart account. Instructions are located on the last page of this paperwork. If you have not heard from [place] regarding the results in 2 weeks, please contact this office.     Place preoperative clearance patient instructions here.

## 2019-12-24 ENCOUNTER — Telehealth: Payer: Self-pay | Admitting: *Deleted

## 2019-12-24 LAB — SAR COV2 SEROLOGY (COVID19)AB(IGG),IA: DiaSorin SARS-CoV-2 Ab, IgG: POSITIVE

## 2019-12-24 NOTE — Telephone Encounter (Signed)
   Primary Cardiologist: Sanda Klein, MD  Chart reviewed as part of pre-operative protocol coverage. Patient was contacted 12/24/2019 in reference to pre-operative risk assessment for pending surgery as outlined below.  Metro Pickett was last seen on 06/11/2019 by Dr. Sallyanne Kuster via a telemedicine visit.  Since that day, Rayshaud Rudnik has done well from a cardiac standpoint. He can easily complete 4 METs without anginal complaints.  Therefore, based on ACC/AHA guidelines, the patient would be at acceptable risk for the planned procedure without further cardiovascular testing.   Per previous recommendations, and given lack of interval change in his cardiac history, patient can hold plavix 5 days prior to his surgery with plans to restart as soon as he is cleared to do so by Dr. Maureen Ralphs.  No EKG's in our system since 2019. Will arrange a nurse visit for EKG check. As long as there are no dynamic changes, I will route this recommendation to the requesting party via Epic fax function and remove from pre-op pool.   Abigail Butts, PA-C 12/24/2019, 4:48 PM

## 2019-12-24 NOTE — Telephone Encounter (Signed)
S/w Satira Anis. LPN to see if she may assist in getting this pt a nurse visit appt for EKG for pre op clearance, Dr. Billey Gosling pt.

## 2019-12-24 NOTE — Telephone Encounter (Signed)
   [facility] Pre-operative Risk Assessment    HEART Request for surgical clearance:  1. What type of surgery is being performed? RIGHT TOTAL KNEE ARTHROPLASTY    2. When is this surgery scheduled? 02/07/20    3. What type of clearance is required (medical clearance vs. Pharmacy clearance to hold med vs. Both)? BOTH  4. Are there any medications that need to be held prior to surgery and how long?PLAVIX   5. Practice name and name of physician performing surgery? DR Hector Shade, [facility]   6. What is the office phone number? [phone]   7.   What is the office fax number? [place]   Anesthesia type (None, local, MAC, general) ? CHOICE

## 2019-12-25 ENCOUNTER — Encounter: Payer: Self-pay | Admitting: Emergency Medicine

## 2019-12-29 NOTE — Telephone Encounter (Signed)
Left message to call back to see if patient could come this afternoon for EKG

## 2019-12-30 ENCOUNTER — Other Ambulatory Visit: Payer: Self-pay

## 2019-12-30 ENCOUNTER — Telehealth: Payer: Self-pay | Admitting: Cardiovascular Disease

## 2019-12-30 ENCOUNTER — Ambulatory Visit (INDEPENDENT_AMBULATORY_CARE_PROVIDER_SITE_OTHER): Payer: Medicare Other

## 2019-12-30 VITALS — BP 152/84 | HR 76 | Ht 72.0 in | Wt 270.4 lb

## 2019-12-30 DIAGNOSIS — Z9861 Coronary angioplasty status: Secondary | ICD-10-CM

## 2019-12-30 DIAGNOSIS — Z01812 Encounter for preprocedural laboratory examination: Secondary | ICD-10-CM | POA: Diagnosis not present

## 2019-12-30 DIAGNOSIS — I10 Essential (primary) hypertension: Secondary | ICD-10-CM

## 2019-12-30 DIAGNOSIS — Z01810 Encounter for preprocedural cardiovascular examination: Secondary | ICD-10-CM

## 2019-12-30 DIAGNOSIS — I2510 Atherosclerotic heart disease of native coronary artery without angina pectoris: Secondary | ICD-10-CM | POA: Diagnosis not present

## 2019-12-30 NOTE — Progress Notes (Signed)
1.) Reason for visit: Pre-operative clearance   2.) Name of MD requesting visit: Roby Lofts, [facility]  3.) H&P: Samuel Chambers is a 69 y.o. male with history of coronary artery disease, diet-controlled diabetes mellitus, hypercholesterolemia, essential hypertension, fibromyalgia.  4.) ROS related to problem: EKG needed for pre-operative clearance for knee surgery  5.) Assessment and plan per MD: EKG shown to Roby Lofts, PAC. Okay to proceed with planned surgical procedure.

## 2019-12-30 NOTE — Telephone Encounter (Signed)
      I went in pt chart to see what message was left for pt on 12-24-19 by Alvina Filbert. Pt need to come on for an EKG for surgical clearance

## 2019-12-30 NOTE — Telephone Encounter (Signed)
   Primary Cardiologist: Sanda Klein, MD  Chart reviewed as part of pre-operative protocol coverage.  EKG 12/30/19 showed sinus rhythm with PACs, rate 76 bpm, PAC, no STE/D, no TWI; no significant change from previous.   Preop assessment detailed below. Amado Nash would be at acceptable risk for the planned procedure without further cardiovascular testing.   I will route this recommendation to the requesting party via Epic fax function and remove from pre-op pool.  Please call with questions.  Abigail Butts, PA-C 12/30/2019, 10:56 AM

## 2020-01-04 NOTE — Addendum Note (Signed)
Addended by: Sherrie Mustache on: 01/04/2020 10:08 AM   Modules accepted: Orders

## 2020-01-06 DIAGNOSIS — M25561 Pain in right knee: Secondary | ICD-10-CM | POA: Diagnosis not present

## 2020-01-14 ENCOUNTER — Encounter (HOSPITAL_COMMUNITY): Payer: Self-pay

## 2020-01-14 NOTE — Patient Instructions (Addendum)
DUE TO COVID-19 ONLY ONE VISITOR ARE ALLOWED TO COME WITH YOU AND STAY IN THE WAITING ROOM ONLY DURING PRE OP AND PROCEDURE. THEN TWO VISITORS MAY VISIT WITH YOU IN YOUR PRIVATE ROOM DURING VISITING HOURS ONLY!!   COVID SWAB TESTING MUST BE COMPLETED ON:  Thursday, January 20, 2020 at 8:30AM   [address], [facility] enter pre surgical testing line (Must self quarantine after testing. Follow instructions on handout.)             Your procedure is scheduled on: Monday, January 24, 2020   Report to [facility] Main  Entrance    Report to admitting at 9:05 AM   Call this number if you have problems the morning of surgery [phone]   Do not eat food:After Midnight.   May have liquids until 8:30 AM day of surgery   CLEAR LIQUID DIET  Foods Allowed                                                                     Foods Excluded  Water, Black Coffee and tea, regular and decaf                             liquids that you cannot  Plain Jell-O in any flavor  (No red)                                           see through such as: Fruit ices (not with fruit pulp)                                     milk, soups, orange juice  Iced Popsicles (No red)                                    All solid food                                   Apple juices Sports drinks like Gatorade (No red) Lightly seasoned clear broth or consume(fat free) Sugar, honey syrup  Sample Menu Breakfast                                Lunch                                     Supper Cranberry juice                    Beef broth                            Chicken broth Jell-O  Grape juice                           Apple juice Coffee or tea                        Jell-O                                      Popsicle                                                Coffee or tea                        Coffee or tea   Complete one G2 drink the morning  of surgery at 8:30 AM the day of surgery.   Oral Hygiene is also important to reduce your risk of infection.                                    Remember - BRUSH YOUR TEETH THE MORNING OF SURGERY WITH YOUR REGULAR TOOTHPASTE   Do NOT smoke after Midnight   Take these medicines the morning of surgery with A SIP OF WATER: Duloxetine, Metoprolol                               You may not have any metal on your body including jewelry, and body piercings             Do not wear lotions, powders, perfumes/cologne, or deodorant                           Men may shave face and neck.   Do not bring valuables to the hospital. [place].   Contacts, dentures or bridgework may not be worn into surgery.   Bring small overnight bag day of surgery.    Patients discharged the day of surgery will not be allowed to drive home.   Special Instructions: Bring a copy of your healthcare power of attorney and living will documents         the day of surgery if you haven't scanned them in before.              Please read over the following fact sheets you were given: IF YOU HAVE QUESTIONS ABOUT YOUR PRE OP INSTRUCTIONS PLEASE CALL [phone]   [facility] - Preparing for Surgery Before surgery, you can play an important role.  Because skin is not sterile, your skin needs to be as free of germs as possible.  You can reduce the number of germs on your skin by washing with CHG (chlorahexidine gluconate) soap before surgery.  CHG is an antiseptic cleaner which kills germs and bonds with the skin to continue killing germs even after washing. Please DO NOT use if you have an allergy to CHG or antibacterial soaps.  If your skin becomes  reddened/irritated stop using the CHG and inform your nurse when you arrive at Short Stay. Do not shave (including legs and underarms) for at least 48 hours prior to the first CHG shower.  You may shave your face/neck.  Please follow  these instructions carefully:  1.  Shower with CHG Soap the night before surgery and the  morning of surgery.  2.  If you choose to wash your hair, wash your hair first as usual with your normal  shampoo.  3.  After you shampoo, rinse your hair and body thoroughly to remove the shampoo.                             4.  Use CHG as you would any other liquid soap.  You can apply chg directly to the skin and wash.  Gently with a scrungie or clean washcloth.  5.  Apply the CHG Soap to your body ONLY FROM THE NECK DOWN.   Do   not use on face/ open                           Wound or open sores. Avoid contact with eyes, ears mouth and   genitals (private parts).                       Wash face,  Genitals (private parts) with your normal soap.             6.  Wash thoroughly, paying special attention to the area where your    surgery  will be performed.  7.  Thoroughly rinse your body with warm water from the neck down.  8.  DO NOT shower/wash with your normal soap after using and rinsing off the CHG Soap.                9.  Pat yourself dry with a clean towel.            10.  Wear clean pajamas.            11.  Place clean sheets on your bed the night of your first shower and do not  sleep with pets. Day of Surgery : Do not apply any lotions/deodorants the morning of surgery.  Please wear clean clothes to the hospital/surgery center.  FAILURE TO FOLLOW THESE INSTRUCTIONS MAY RESULT IN THE CANCELLATION OF YOUR SURGERY  PATIENT SIGNATURE_________________________________  NURSE SIGNATURE__________________________________  ________________________________________________________________________   [name]  An incentive spirometer is a tool that can help keep your lungs clear and active. This tool measures how well you are filling your lungs with each breath. Taking long deep breaths may help reverse or decrease the chance of developing breathing (pulmonary) problems (especially  infection) following:  A long period of time when you are unable to move or be active. BEFORE THE PROCEDURE   If the spirometer includes an indicator to show your best effort, your nurse or respiratory therapist will set it to a desired goal.  If possible, sit up straight or lean slightly forward. Try not to slouch.  Hold the incentive spirometer in an upright position. INSTRUCTIONS FOR USE  1. Sit on the edge of your bed if possible, or sit up as far as you can in bed or on a chair. 2. Hold the incentive spirometer in an upright position. 3. Breathe out normally. 4. Place the  mouthpiece in your mouth and seal your lips tightly around it. 5. Breathe in slowly and as deeply as possible, raising the piston or the ball toward the top of the column. 6. Hold your breath for 3-5 seconds or for as long as possible. Allow the piston or ball to fall to the bottom of the column. 7. Remove the mouthpiece from your mouth and breathe out normally. 8. Rest for a few seconds and repeat Steps 1 through 7 at least 10 times every 1-2 hours when you are awake. Take your time and take a few normal breaths between deep breaths. 9. The spirometer may include an indicator to show your best effort. Use the indicator as a goal to work toward during each repetition. 10. After each set of 10 deep breaths, practice coughing to be sure your lungs are clear. If you have an incision (the cut made at the time of surgery), support your incision when coughing by placing a pillow or rolled up towels firmly against it. Once you are able to get out of bed, walk around indoors and cough well. You may stop using the incentive spirometer when instructed by your caregiver.  RISKS AND COMPLICATIONS  Take your time so you do not get dizzy or light-headed.  If you are in pain, you may need to take or ask for pain medication before doing incentive spirometry. It is harder to take a deep breath if you are having pain. AFTER  USE  Rest and breathe slowly and easily.  It can be helpful to keep track of a log of your progress. Your caregiver can provide you with a simple table to help with this. If you are using the spirometer at home, follow these instructions: [place] IF:   You are having difficultly using the spirometer.  You have trouble using the spirometer as often as instructed.  Your pain medication is not giving enough relief while using the spirometer.  You develop fever of 100.5 F (38.1 C) or higher. SEEK IMMEDIATE MEDICAL CARE IF:   You cough up bloody sputum that had not been present before.  You develop fever of 102 F (38.9 C) or greater.  You develop worsening pain at or near the incision site. MAKE SURE YOU:   Understand these instructions.  Will watch your condition.  Will get help right away if you are not doing well or get worse. Document Released: 11/25/2006 Document Revised: 10/07/2011 Document Reviewed: 01/26/2007 ExitCare Patient Information 2014 Exit[employer], [place].   ________________________________________________________________________  WHAT IS A BLOOD TRANSFUSION? Blood Transfusion Information  A transfusion is the replacement of blood or some of its parts. Blood is made up of multiple cells which provide different functions.  Red blood cells carry oxygen and are used for blood loss replacement.  White blood cells fight against infection.  Platelets control bleeding.  Plasma helps clot blood.  Other blood products are available for specialized needs, such as hemophilia or other clotting disorders. BEFORE THE TRANSFUSION  Who gives blood for transfusions?   Healthy volunteers who are fully evaluated to make sure their blood is safe. This is blood bank blood. Transfusion therapy is the safest it has ever been in the practice of medicine. Before blood is taken from a donor, a complete history is taken to make sure that person has no history of diseases  nor engages in risky social behavior (examples are intravenous drug use or sexual activity with multiple partners). The donor's travel history is screened to  minimize risk of transmitting infections, such as malaria. The donated blood is tested for signs of infectious diseases, such as HIV and hepatitis. The blood is then tested to be sure it is compatible with you in order to minimize the chance of a transfusion reaction. If you or a relative donates blood, this is often done in anticipation of surgery and is not appropriate for emergency situations. It takes many days to process the donated blood. RISKS AND COMPLICATIONS Although transfusion therapy is very safe and saves many lives, the main dangers of transfusion include:   Getting an infectious disease.  Developing a transfusion reaction. This is an allergic reaction to something in the blood you were given. Every precaution is taken to prevent this. The decision to have a blood transfusion has been considered carefully by your caregiver before blood is given. Blood is not given unless the benefits outweigh the risks. AFTER THE TRANSFUSION  Right after receiving a blood transfusion, you will usually feel much better and more energetic. This is especially true if your red blood cells have gotten low (anemic). The transfusion raises the level of the red blood cells which carry oxygen, and this usually causes an energy increase.  The nurse administering the transfusion will monitor you carefully for complications. HOME CARE INSTRUCTIONS  No special instructions are needed after a transfusion. You may find your energy is better. Speak with your caregiver about any limitations on activity for underlying diseases you may have. SEEK MEDICAL CARE IF:   Your condition is not improving after your transfusion.  You develop redness or irritation at the intravenous (IV) site. SEEK IMMEDIATE MEDICAL CARE IF:  Any of the following symptoms occur over the  next 12 hours:  Shaking chills.  You have a temperature by mouth above 102 F (38.9 C), not controlled by medicine.  Chest, back, or muscle pain.  People around you feel you are not acting correctly or are confused.  Shortness of breath or difficulty breathing.  Dizziness and fainting.  You get a rash or develop hives.  You have a decrease in urine output.  Your urine turns a dark color or changes to pink, red, or brown. Any of the following symptoms occur over the next 10 days:  You have a temperature by mouth above 102 F (38.9 C), not controlled by medicine.  Shortness of breath.  Weakness after normal activity.  The white part of the eye turns yellow (jaundice).  You have a decrease in the amount of urine or are urinating less often.  Your urine turns a dark color or changes to pink, red, or brown. Document Released: 07/12/2000 Document Revised: 10/07/2011 Document Reviewed: 02/29/2008 [facility] Patient Information 2014 [place], [place].  _______________________________________________________________________

## 2020-01-14 NOTE — Progress Notes (Addendum)
COVID Vaccine Completed: No Date COVID Vaccine completed: N/A COVID vaccine manufacturer: N/A   PCP - Dr. Dimas Alexandria last office visit and clearance note 12/23/2019 in epic Cardiologist - Dr. Jerilynn Mages. Croitoru last office visit 06/11/2019 in epic, Cardiac clearance note from K. Kroeger in epic 12/30/2019 telephone encounter 12/24/2019  Chest x-ray - 03/04/2019 in epic EKG - 12/30/2019 in epic Stress Test - greater than 2 years ECHO - greater than 2 years Cardiac Cath - greater than 2 years  Sleep Study - N/A CPAP - N/A  Fasting Blood Sugar - N/A Checks Blood Sugar sporadically Hgb A1c 6.1 12/23/2019 in epic  Blood Thinner Instructions: Plavix will stop 5 days prior to surgery Aspirin Instructions: N/A Last Dose:N/A  Anesthesia review: CAD, HTN, Cardiac valve disease  Patient denies shortness of breath, fever, cough and chest pain at PAT appointment   Patient verbalized understanding of instructions that were given to them at the PAT appointment. Patient was also instructed that they will need to review over the PAT instructions again at home before surgery.

## 2020-01-17 ENCOUNTER — Other Ambulatory Visit: Payer: Self-pay

## 2020-01-17 ENCOUNTER — Encounter (HOSPITAL_COMMUNITY)
Admission: RE | Admit: 2020-01-17 | Discharge: 2020-01-17 | Disposition: A | Discharge status: Home / Self Care | Payer: Medicare Other | Source: Ambulatory Visit | Attending: Orthopedic Surgery | Admitting: Orthopedic Surgery

## 2020-01-17 ENCOUNTER — Encounter (HOSPITAL_COMMUNITY): Payer: Self-pay

## 2020-01-17 DIAGNOSIS — Z01812 Encounter for preprocedural laboratory examination: Secondary | ICD-10-CM | POA: Diagnosis not present

## 2020-01-17 HISTORY — DX: Type 2 diabetes mellitus without complications: E11.9

## 2020-01-17 LAB — CBC
HCT: 45.3 % (ref 39.0–52.0)
Hemoglobin: 15.1 g/dL (ref 13.0–17.0)
MCH: 29.2 pg (ref 26.0–34.0)
MCHC: 33.3 g/dL (ref 30.0–36.0)
MCV: 87.6 fL (ref 80.0–100.0)
Platelets: 212 K/uL (ref 150–400)
RBC: 5.17 MIL/uL (ref 4.22–5.81)
RDW: 13.5 % (ref 11.5–15.5)
WBC: 9.4 K/uL (ref 4.0–10.5)
nRBC: 0 % (ref 0.0–0.2)

## 2020-01-17 LAB — APTT: aPTT: 28 s (ref 24–36)

## 2020-01-17 LAB — COMPREHENSIVE METABOLIC PANEL WITH GFR
ALT: 36 U/L (ref 0–44)
AST: 32 U/L (ref 15–41)
Albumin: 4 g/dL (ref 3.5–5.0)
Alkaline Phosphatase: 49 U/L (ref 38–126)
Anion gap: 9 (ref 5–15)
BUN: 17 mg/dL (ref 8–23)
CO2: 28 mmol/L (ref 22–32)
Calcium: 8.9 mg/dL (ref 8.9–10.3)
Chloride: 100 mmol/L (ref 98–111)
Creatinine, Ser: 0.98 mg/dL (ref 0.61–1.24)
GFR calc Af Amer: >60 mL/min
GFR calc non Af Amer: >60 mL/min
Glucose, Bld: 187 mg/dL — ABNORMAL HIGH (ref 70–99)
Potassium: 3.4 mmol/L — ABNORMAL LOW (ref 3.5–5.1)
Sodium: 137 mmol/L (ref 135–145)
Total Bilirubin: 0.6 mg/dL (ref 0.3–1.2)
Total Protein: 7 g/dL (ref 6.5–8.1)

## 2020-01-17 LAB — PROTIME-INR
INR: 1 (ref 0.8–1.2)
Prothrombin Time: 12.3 s (ref 11.4–15.2)

## 2020-01-17 LAB — SURGICAL PCR SCREEN
MRSA, PCR: NEGATIVE
Staphylococcus aureus: NEGATIVE

## 2020-01-18 NOTE — H&P (Signed)
TOTAL KNEE ADMISSION H&P  Patient is being admitted for right total knee arthroplasty.  Subjective:  Chief Complaint: Right knee pain.  HPI: Samuel Chambers, 69 y.o. male has a history of pain and functional disability in the right knee due to arthritis and has failed non-surgical conservative treatments for greater than 12 weeks to include corticosteriod injections, viscosupplementation injections and activity modification. Onset of symptoms was gradual, starting 5 years ago with gradually worsening course since that time. The patient noted no past surgery on the right knee.  Patient currently rates pain in the right knee at 8 out of 10 with activity. Patient has worsening of pain with activity and weight bearing, pain that interferes with activities of daily living, crepitus, joint swelling and instability. Patient has evidence of near bone-on-bone medial compartment and severe patellofemoral degenerative change and moderate lateral compartment changes by imaging studies. There is no active infection.  Patient Active Problem List   Diagnosis Date Noted  . OA (osteoarthritis) of hip 03/16/2018  . Elevated hemoglobin A1c 03/14/2018  . Severe obesity (BMI 35.0-35.9 with comorbidity) ([place]) 08/04/2014  . Hyperlipidemia with target LDL less than 70; statin intolerant 04/29/2013  . CAD S/P percutaneous coronary angioplasty   . S/P laparoscopic cholecystectomy July 2014 02/18/2013  . OA (osteoarthritis) of knee 09/04/2012  . Essential hypertension 02/24/2007  . GERD 02/24/2007  . DEGENERATIVE JOINT DISEASE, GENERALIZED 02/24/2007    Past Medical History:  Diagnosis Date  . Adenomatous colon polyp 03/2005  . Anxiety   . CAD S/P percutaneous coronary angioplasty 2008   PCI to circumflex with a Promus DES 2.5 mm x 8 mm  . Degenerative joint disease   . Diabetes mellitus without complication (HCC)    diet controlled  . Diverticulosis   . Fatty liver 2016   pt denies  . Fibromyalgia   .  GERD (gastroesophageal reflux disease)   . Hyperlipidemia    statin intolerant  . Hypertension   . Internal hemorrhoids   . Obesity, Class II, BMI 35-39.9   . Pneumonia 2016  . PONV (postoperative nausea and vomiting)     Past Surgical History:  Procedure Laterality Date  . CHOLECYSTECTOMY N/A 02/18/2013   Procedure: LAPAROSCOPIC CHOLECYSTECTOMY WITH INTRAOPERATIVE CHOLANGIOGRAM;  Surgeon: Pedro Earls, MD;  Location: [facility];  Service: General;  Laterality: N/A;  . COLONOSCOPY    . CORONARY ANGIOPLASTY WITH STENT PLACEMENT  04/30/2007   2.5 mm Promus stent that was to 95% Circ;had 60-70% lesion to RCA  . DOPPLER ECHOCARDIOGRAPHY  05/27/2002   [facility].-normal EF 55-66%,  . FOOT SURGERY Right    x2 fascia  . HEEL SPUR SURGERY Right   . HEMORRHOID SURGERY    . Holter Monitor  04/07/2007   sinus tachy.;  . KNEE ARTHROSCOPY  03/27/2012   Procedure: ARTHROSCOPY KNEE;  Surgeon: Gearlean Alf, MD;  Location: [facility];  Service: Orthopedics;  Laterality: Left;  . NM MYOCAR PERF WALL MOTION  12/19/2011   EXERCISED FOR 8-1/2 MINUTES RECHING 10 METABOLIC EQUIVALENTS. NO EVIDENCE  OF ISCHEMIA OR INFARCTION . EF 71%  . renal dopplers  04/08/2007   relatively normal  . TOTAL HIP ARTHROPLASTY Right 03/16/2018   Procedure: RIGHT TOTAL HIP ARTHROPLASTY ANTERIOR APPROACH;  Surgeon: Gaynelle Arabian, MD;  Location: [facility];  Service: Orthopedics;  Laterality: Right;  . TOTAL HIP ARTHROPLASTY Left about 10 to 12 years ago  . TOTAL KNEE ARTHROPLASTY Left 09/04/2012   Procedure: TOTAL KNEE ARTHROPLASTY;  Surgeon: Dione Plover  Aluisio, MD;  Location: [facility];  Service: Orthopedics;  Laterality: Left;    Prior to Admission medications   Medication Sig Start Date End Date Taking? Authorizing Provider  clopidogrel (PLAVIX) 75 MG tablet TAKE 1 TABLET(75 MG) BY MOUTH DAILY Patient taking differently: Take 75 mg by mouth daily.  06/11/19  Yes Croitoru, Mihai, MD  DULoxetine (CYMBALTA) 60 MG capsule Take 1 capsule  (60 mg total) by mouth daily. 07/19/19  Yes Sagardia, Ines Bloomer, MD  hydrochlorothiazide (HYDRODIURIL) 25 MG tablet TAKE 1 TABLET(25 MG) BY MOUTH DAILY Patient taking differently: Take 25 mg by mouth daily.  06/11/19  Yes Croitoru, Mihai, MD  losartan (COZAAR) 100 MG tablet Take 1 tablet (100 mg total) by mouth daily. 09/07/19  Yes Croitoru, Mihai, MD  metoprolol succinate (TOPROL-XL) 50 MG 24 hr tablet TAKE 1 TABLET BY MOUTH DAILY. TAKE WITH OR IMMEDIATELY FOLLOWING A MEAL Patient taking differently: Take 50 mg by mouth daily.  07/09/19  Yes Croitoru, Mihai, MD  albuterol (VENTOLIN HFA) 108 (90 Base) MCG/ACT inhaler Inhale 1-2 puffs into the lungs every 6 (six) hours as needed for wheezing or shortness of breath. Patient not taking: Reported on 01/04/2020 02/24/19   Jaynee Eagles, PA-C  azithromycin [facility]) 250 MG tablet Sig as indicated Patient not taking: Reported on 01/04/2020 02/01/19   Horald Pollen, MD  cetirizine (ZYRTEC) 10 MG tablet Take 1 tablet (10 mg total) by mouth daily. Patient not taking: Reported on 12/23/2019 03/04/19   Noe Gens, PA-C  Evolocumab (REPATHA SURECLICK) 409 MG/ML SOAJ Inject 140 mg into the skin every 14 (fourteen) days. Patient not taking: Reported on 02/01/2019 08/26/18   Croitoru, Mihai, MD  HYDROcodone-homatropine [facility]) 5-1.5 MG/5ML syrup Take 5 mLs by mouth at bedtime as needed for cough. Patient not taking: Reported on 01/04/2020 02/24/19   Jaynee Eagles, PA-C  ipratropium (ATROVENT) 0.02 % nebulizer solution Take 2.5 mLs (0.5 mg total) by nebulization 4 (four) times daily. Patient not taking: Reported on 01/04/2020 03/04/19   Noe Gens, PA-C  predniSONE (DELTASONE) 20 MG tablet Take 2 tablets daily with breakfast. Patient not taking: Reported on 01/04/2020 02/24/19   Jaynee Eagles, PA-C    Allergies  Allergen Reactions  . Gabapentin     Pt reports increased heart rate and blood pressure  . Crestor [Rosuvastatin] Other (See Comments)    myalgias  .  Oxycodone Other (See Comments)    Hallucinations-ok in small doses  . Prednisone Other (See Comments)    Increased BP and HR in high doses  . Valsartan Other (See Comments)    Made patient feel tired and no energy  . Warfarin And Related Other (See Comments)    Fast heart beat, went down hill, felt like he was dying    Social History   Socioeconomic History  . Marital status: Married    Spouse name: Not on file  . Number of children: 1  . Years of education: Not on file  . Highest education level: Not on file  Occupational History    Employer: [employer]  Tobacco Use  . Smoking status: Never Smoker  . Smokeless tobacco: Never Used  Vaping Use  . Vaping Use: Never used  Substance and Sexual Activity  . Alcohol use: No  . Drug use: No  . Sexual activity: Not on file  Other Topics Concern  . Not on file  Social History Narrative   Very little 0-2 drinks a week   Social Determinants of Health  Financial Resource Strain:   . Difficulty of Paying Living Expenses:   Food Insecurity:   . Worried About Charity fundraiser in the Last Year:   . Arboriculturist in the Last Year:   Transportation Needs:   . Film/video editor (Medical):   Marland Kitchen Lack of Transportation (Non-Medical):   Physical Activity:   . Days of Exercise per Week:   . Minutes of Exercise per Session:   Stress:   . Feeling of Stress :   Social Connections:   . Frequency of Communication with Friends and Family:   . Frequency of Social Gatherings with Friends and Family:   . Attends Religious Services:   . Active Member of Clubs or Organizations:   . Attends Archivist Meetings:   Marland Kitchen Marital Status:   Intimate Partner Violence:   . Fear of Current or Ex-Partner:   . Emotionally Abused:   Marland Kitchen Physically Abused:   . Sexually Abused:       Tobacco Use: Low Risk   . Smoking Tobacco Use: Never Smoker  . Smokeless Tobacco Use: Never Used   Social History   Substance and Sexual Activity   Alcohol Use No    Family History  Problem Relation Age of Onset  . Breast cancer Mother   . Diabetes Mother   . Prostate cancer Father   . Colon cancer Neg Hx     Review of Systems  Constitutional: Negative for chills and fever.  HENT: Negative for congestion, sore throat and tinnitus.   Eyes: Negative for double vision, photophobia and pain.  Respiratory: Negative for cough, shortness of breath and wheezing.   Cardiovascular: Negative for chest pain, palpitations and orthopnea.  Gastrointestinal: Negative for heartburn, nausea and vomiting.  Genitourinary: Negative for dysuria, frequency and urgency.  Musculoskeletal: Positive for joint pain.  Neurological: Negative for dizziness, weakness and headaches.    Objective:  Physical Exam: Well nourished and well developed.  General: Alert and oriented x3, cooperative and pleasant, no acute distress.  Head: normocephalic, atraumatic, neck supple.  Eyes: EOMI.  Respiratory: breath sounds clear in all fields, no wheezing, rales, or rhonchi. Cardiovascular: Regular rate and rhythm, no murmurs, gallops or rubs.  Abdomen: non-tender to palpation and soft, normoactive bowel sounds. Musculoskeletal:  Right Knee Exam: His right knee shows a 1+ effusion. No warmth or redness. Full extension with further flexion about 105 with pain and discomfort. No ligamentous instability pain on McMurray's and moderate patellofemoral crepitation.  Calves soft and nontender. Motor function intact in LE. Strength 5/5 LE bilaterally. Neuro: Distal pulses 2+. Sensation to light touch intact in LE.  Vital signs in last 24 hours: Temp:  [98.5 F (36.9 C)] 98.5 F (36.9 C) (06/21 1018) Pulse Rate:  [85] 85 (06/21 1018) Resp:  [18] 18 (06/21 1018) BP: (142)/(79) 142/79 (06/21 1018) SpO2:  [97 %] 97 % (06/21 1018) Weight:  [623 kg] 123 kg (06/21 1018)  Imaging Review Plain radiographs demonstrate severe degenerative joint disease of the right knee.  The overall alignment is neutral. The bone quality appears to be adequate for age and reported activity level.  Assessment/Plan:  End stage arthritis, right knee   The patient history, physical examination, clinical judgment of the provider and imaging studies are consistent with end stage degenerative joint disease of the right knee and total knee arthroplasty is deemed medically necessary. The treatment options including medical management, injection therapy arthroscopy and arthroplasty were discussed at length. The risks and  benefits of total knee arthroplasty were presented and reviewed. The risks due to aseptic loosening, infection, stiffness, patella tracking problems, thromboembolic complications and other imponderables were discussed. The patient acknowledged the explanation, agreed to proceed with the plan and consent was signed. Patient is being admitted for inpatient treatment for surgery, pain control, PT, OT, prophylactic antibiotics, VTE prophylaxis, progressive ambulation and ADLs and discharge planning. The patient is planning to be discharged home.   Patient's anticipated LOS is less than 2 midnights, meeting these requirements: - Lives within 1 hour of care - Has a competent adult at home to recover with post-op recover - NO history of  - Chronic pain requiring opiods  - Heart failure  - Heart attack  - Stroke  - DVT/VTE  - Cardiac arrhythmia  - Respiratory Failure/COPD  - Renal failure  - Anemia  - Advanced Liver disease  Therapy Plans: Outpatient therapy at [facility] Disposition: Home with wife Planned DVT Prophylaxis: Plavix & ASA 325 mg QD DME Needed: None PCP: Agustina Caroli, MD  Cardiologist: Patrica Duel, MD  TXA: IV Allergies: Gabapentin (cognitive issues), oxycodone (hallucinations), prednisone (palpitations) Anesthesia Concerns: None BMI: 35.7 Last HgbA1c: 6.1%  Other: - Hx CAD with stent placement  - Patient was instructed on what medications  to stop prior to surgery. - Follow-up visit in 2 weeks with Dr. Wynelle Link - Begin physical therapy following surgery - Pre-operative lab work as pre-surgical testing - Prescriptions will be provided in hospital at time of discharge  Theresa Duty, PA-C Orthopedic Surgery [facility] [place]

## 2020-01-19 NOTE — Anesthesia Preprocedure Evaluation (Addendum)
Anesthesia Evaluation  Patient identified by MRN, date of birth, ID band Patient awake    Reviewed: Allergy & Precautions, NPO status , Patient's Chart, lab work & pertinent test results  History of Anesthesia Complications (+) PONV and history of anesthetic complications  Airway Mallampati: II  TM Distance: >3 FB Neck ROM: Full    Dental  (+) Missing,    Pulmonary neg pulmonary ROS,    Pulmonary exam normal        Cardiovascular hypertension, + CAD and + Cardiac Stents (2008, on Plavix (last dose 01/17/20))  Normal cardiovascular exam     Neuro/Psych Anxiety negative neurological ROS     GI/Hepatic Neg liver ROS, GERD  ,  Endo/Other  diabetes, Type 2  Renal/GU negative Renal ROS  negative genitourinary   Musculoskeletal  (+) Arthritis , Osteoarthritis,  Fibromyalgia -  Abdominal   Peds  Hematology negative hematology ROS (+)   Anesthesia Other Findings Day of surgery medications reviewed with patient.  Reproductive/Obstetrics negative OB ROS                           Anesthesia Physical Anesthesia Plan  ASA: III  Anesthesia Plan: Spinal   Post-op Pain Management:  Regional for Post-op pain   Induction:   PONV Risk Score and Plan: 3 and Treatment may vary due to age or medical condition, Ondansetron, Propofol infusion and Dexamethasone  Airway Management Planned: Natural Airway and Simple Face Mask  Additional Equipment: None  Intra-op Plan:   Post-operative Plan:   Informed Consent: I have reviewed the patients History and Physical, chart, labs and discussed the procedure including the risks, benefits and alternatives for the proposed anesthesia with the patient or authorized representative who has indicated his/her understanding and acceptance.       Plan Discussed with: CRNA  Anesthesia Plan Comments: (See PAT note 01/17/2020, Konrad Felix, PA-C)       Anesthesia Quick Evaluation

## 2020-01-19 NOTE — Progress Notes (Signed)
Anesthesia Chart Review   Case: [id number] Date/Time: 01/24/20 1120   Procedure: TOTAL KNEE ARTHROPLASTY (Right Knee) - 44min   Anesthesia type: Choice   Pre-op diagnosis: right knee osteoarthritis   [facility]   Surgeons: Gaynelle Arabian, MD      DISCUSSION:69 y.o. never smoker with h/o PONV, GERD, HTN, DM II diet controlled, HLD, CAD (DES 2008), right knee OA scheduled for above procedure 01/24/2020 with Dr. Gaynelle Arabian.   Per cardiology pre-operative risk assessment 12/24/2019, "Chart reviewed as part of pre-operative protocol coverage. Patient was contacted 12/24/2019 in reference to pre-operative risk assessment for pending surgery as outlined below.  Mahmoud Blazejewski was last seen on 06/11/2019 by Dr. Sallyanne Kuster via a telemedicine visit.  Since that day, Jaxtin Raimondo has done well from a cardiac standpoint. He can easily complete 4 METs without anginal complaints. Therefore, based on ACC/AHA guidelines, the patient would be at acceptable risk for the planned procedure without further cardiovascular testing.  Per previous recommendations, and given lack of interval change in his cardiac history, patient can hold plavix 5 days prior to his surgery with plans to restart as soon as he is cleared to do so by Dr. Maureen Ralphs."  Anticipate pt can proceed with planned procedure barring acute status change.    VS: BP (!) 142/79   Pulse 85   Temp 36.9 C (Oral)   Resp 18   Ht 6' (1.829 m)   Wt 123 kg   SpO2 97%   BMI 36.76 kg/m   PROVIDERS: Horald Pollen, MD is PCP   Croitoru, Dani Gobble, MD is Cardiologist  LABS: Labs reviewed: Acceptable for surgery. (all labs ordered are listed, but only abnormal results are displayed)  Labs Reviewed  COMPREHENSIVE METABOLIC PANEL - Abnormal; Notable for the following components:      Result Value   Potassium 3.4 (*)    Glucose, Bld 187 (*)    All other components within normal limits  SURGICAL PCR SCREEN  APTT  CBC   PROTIME-INR  TYPE AND SCREEN     IMAGES:   EKG: 12/30/2019 Rate 76 bpm Sinus rhythm with premature atrial complexes Left axis deviation   CV: Echo 05/27/2002 SUMMARY  - Overall left ventricular systolic function was normal. Left     ventricular ejection fraction was estimated , range being 55     % to 65 %. There were no left ventricular regional wall     motion abnormalities.  - Left atrial size was at the upper limits of normal.   Past Medical History:  Diagnosis Date  . Adenomatous colon polyp 03/2005  . Anxiety   . CAD S/P percutaneous coronary angioplasty 2008   PCI to circumflex with a Promus DES 2.5 mm x 8 mm  . Degenerative joint disease   . Diabetes mellitus without complication (HCC)    diet controlled  . Diverticulosis   . Fatty liver 2016   pt denies  . Fibromyalgia   . GERD (gastroesophageal reflux disease)   . Hyperlipidemia    statin intolerant  . Hypertension   . Internal hemorrhoids   . Obesity, Class II, BMI 35-39.9   . Pneumonia 2016  . PONV (postoperative nausea and vomiting)     Past Surgical History:  Procedure Laterality Date  . CHOLECYSTECTOMY N/A 02/18/2013   Procedure: LAPAROSCOPIC CHOLECYSTECTOMY WITH INTRAOPERATIVE CHOLANGIOGRAM;  Surgeon: Pedro Earls, MD;  Location: [facility];  Service: General;  Laterality: N/A;  . COLONOSCOPY    .  CORONARY ANGIOPLASTY WITH STENT PLACEMENT  04/30/2007   2.5 mm Promus stent that was to 95% Circ;had 60-70% lesion to RCA  . DOPPLER ECHOCARDIOGRAPHY  05/27/2002   [facility].-normal EF 55-66%,  . FOOT SURGERY Right    x2 fascia  . HEEL SPUR SURGERY Right   . HEMORRHOID SURGERY    . Holter Monitor  04/07/2007   sinus tachy.;  . KNEE ARTHROSCOPY  03/27/2012   Procedure: ARTHROSCOPY KNEE;  Surgeon: Gearlean Alf, MD;  Location: [facility];  Service: Orthopedics;  Laterality: Left;  . NM MYOCAR PERF WALL MOTION  12/19/2011   EXERCISED FOR 8-1/2 MINUTES RECHING 10 METABOLIC EQUIVALENTS. NO  EVIDENCE  OF ISCHEMIA OR INFARCTION . EF 71%  . renal dopplers  04/08/2007   relatively normal  . TOTAL HIP ARTHROPLASTY Right 03/16/2018   Procedure: RIGHT TOTAL HIP ARTHROPLASTY ANTERIOR APPROACH;  Surgeon: Gaynelle Arabian, MD;  Location: [facility];  Service: Orthopedics;  Laterality: Right;  . TOTAL HIP ARTHROPLASTY Left about 10 to 12 years ago  . TOTAL KNEE ARTHROPLASTY Left 09/04/2012   Procedure: TOTAL KNEE ARTHROPLASTY;  Surgeon: Gearlean Alf, MD;  Location: [facility];  Service: Orthopedics;  Laterality: Left;    MEDICATIONS: . albuterol (VENTOLIN HFA) 108 (90 Base) MCG/ACT inhaler  . azithromycin (ZITHROMAX) 250 MG tablet  . cetirizine (ZYRTEC) 10 MG tablet  . clopidogrel (PLAVIX) 75 MG tablet  . DULoxetine (CYMBALTA) 60 MG capsule  . Evolocumab (REPATHA SURECLICK) 532 MG/ML SOAJ  . hydrochlorothiazide (HYDRODIURIL) 25 MG tablet  . HYDROcodone-homatropine (HYCODAN) 5-1.5 MG/5ML syrup  . ipratropium (ATROVENT) 0.02 % nebulizer solution  . losartan (COZAAR) 100 MG tablet  . metoprolol succinate (TOPROL-XL) 50 MG 24 hr tablet  . predniSONE (DELTASONE) 20 MG tablet   No current facility-administered medications for this encounter.    Maia Plan [facility] Pre-Surgical Testing [phone] 01/19/20  12:44 PM

## 2020-01-20 ENCOUNTER — Other Ambulatory Visit (HOSPITAL_COMMUNITY)
Admission: RE | Admit: 2020-01-20 | Discharge: 2020-01-20 | Disposition: A | Discharge status: Home / Self Care | Payer: Medicare Other | Source: Ambulatory Visit | Attending: Orthopedic Surgery | Admitting: Orthopedic Surgery

## 2020-01-20 DIAGNOSIS — Z01812 Encounter for preprocedural laboratory examination: Secondary | ICD-10-CM | POA: Insufficient documentation

## 2020-01-20 DIAGNOSIS — Z20822 Contact with and (suspected) exposure to covid-19: Secondary | ICD-10-CM | POA: Diagnosis not present

## 2020-01-20 LAB — SARS CORONAVIRUS 2 (TAT 6-24 HRS): SARS Coronavirus 2: NEGATIVE

## 2020-01-23 MED ORDER — DEXTROSE 5 % IV SOLN
3.0000 g | INTRAVENOUS | Status: AC
  Administered 2020-01-24: 3 g via INTRAVENOUS
  Filled 2020-01-23: qty 3

## 2020-01-23 MED ORDER — BUPIVACAINE LIPOSOME 1.3 % IJ SUSP
20.0000 mL | INTRAMUSCULAR | Status: DC
  Filled 2020-01-23: qty 20

## 2020-01-24 ENCOUNTER — Encounter (HOSPITAL_COMMUNITY): Payer: Self-pay | Admitting: Orthopedic Surgery

## 2020-01-24 ENCOUNTER — Encounter (HOSPITAL_COMMUNITY)
Admission: RE | Disposition: A | Discharge status: Home / Self Care | Payer: Self-pay | Source: Home / Self Care | Attending: Orthopedic Surgery

## 2020-01-24 ENCOUNTER — Ambulatory Visit (HOSPITAL_COMMUNITY): Payer: Medicare Other | Admitting: Anesthesiology

## 2020-01-24 ENCOUNTER — Other Ambulatory Visit: Payer: Self-pay

## 2020-01-24 ENCOUNTER — Ambulatory Visit (HOSPITAL_COMMUNITY): Payer: Medicare Other | Admitting: Physician Assistant

## 2020-01-24 ENCOUNTER — Observation Stay (HOSPITAL_COMMUNITY)
Admission: RE | Admit: 2020-01-24 | Discharge: 2020-01-25 | Disposition: A | Discharge status: Home / Self Care | Payer: Medicare Other | Attending: Orthopedic Surgery | Admitting: Orthopedic Surgery

## 2020-01-24 DIAGNOSIS — Z7902 Long term (current) use of antithrombotics/antiplatelets: Secondary | ICD-10-CM | POA: Insufficient documentation

## 2020-01-24 DIAGNOSIS — Z6836 Body mass index (BMI) 36.0-36.9, adult: Secondary | ICD-10-CM | POA: Diagnosis not present

## 2020-01-24 DIAGNOSIS — Z955 Presence of coronary angioplasty implant and graft: Secondary | ICD-10-CM | POA: Diagnosis not present

## 2020-01-24 DIAGNOSIS — Z7952 Long term (current) use of systemic steroids: Secondary | ICD-10-CM | POA: Diagnosis not present

## 2020-01-24 DIAGNOSIS — Z79899 Other long term (current) drug therapy: Secondary | ICD-10-CM | POA: Insufficient documentation

## 2020-01-24 DIAGNOSIS — K219 Gastro-esophageal reflux disease without esophagitis: Secondary | ICD-10-CM | POA: Diagnosis not present

## 2020-01-24 DIAGNOSIS — F419 Anxiety disorder, unspecified: Secondary | ICD-10-CM | POA: Diagnosis not present

## 2020-01-24 DIAGNOSIS — I2510 Atherosclerotic heart disease of native coronary artery without angina pectoris: Secondary | ICD-10-CM | POA: Diagnosis not present

## 2020-01-24 DIAGNOSIS — E119 Type 2 diabetes mellitus without complications: Secondary | ICD-10-CM | POA: Insufficient documentation

## 2020-01-24 DIAGNOSIS — I10 Essential (primary) hypertension: Secondary | ICD-10-CM | POA: Diagnosis not present

## 2020-01-24 DIAGNOSIS — K760 Fatty (change of) liver, not elsewhere classified: Secondary | ICD-10-CM | POA: Insufficient documentation

## 2020-01-24 DIAGNOSIS — E785 Hyperlipidemia, unspecified: Secondary | ICD-10-CM | POA: Insufficient documentation

## 2020-01-24 DIAGNOSIS — M797 Fibromyalgia: Secondary | ICD-10-CM | POA: Insufficient documentation

## 2020-01-24 DIAGNOSIS — Z20822 Contact with and (suspected) exposure to covid-19: Secondary | ICD-10-CM | POA: Insufficient documentation

## 2020-01-24 DIAGNOSIS — M179 Osteoarthritis of knee, unspecified: Secondary | ICD-10-CM

## 2020-01-24 DIAGNOSIS — E669 Obesity, unspecified: Secondary | ICD-10-CM | POA: Insufficient documentation

## 2020-01-24 DIAGNOSIS — M1711 Unilateral primary osteoarthritis, right knee: Principal | ICD-10-CM | POA: Insufficient documentation

## 2020-01-24 HISTORY — PX: TOTAL KNEE ARTHROPLASTY: SHX125

## 2020-01-24 LAB — GLUCOSE, CAPILLARY: Glucose-Capillary: 99 mg/dL (ref 70–99)

## 2020-01-24 LAB — TYPE AND SCREEN
ABO/RH(D): B POS
Antibody Screen: NEGATIVE

## 2020-01-24 SURGERY — ARTHROPLASTY, KNEE, TOTAL
Anesthesia: Spinal | Site: Knee | Laterality: Right

## 2020-01-24 MED ORDER — ONDANSETRON HCL 4 MG PO TABS: 4.0000 mg | ORAL_TABLET | Freq: Four times a day (QID) | ORAL | Status: DC | PRN

## 2020-01-24 MED ORDER — LACTATED RINGERS IV SOLN: INTRAVENOUS | Status: DC

## 2020-01-24 MED ORDER — CLOPIDOGREL BISULFATE 75 MG PO TABS
75.0000 mg | ORAL_TABLET | Freq: Every day | ORAL | Status: DC
  Administered 2020-01-25: 75 mg via ORAL
  Filled 2020-01-24: qty 1

## 2020-01-24 MED ORDER — METOCLOPRAMIDE HCL 5 MG PO TABS: 5.0000 mg | ORAL_TABLET | Freq: Three times a day (TID) | ORAL | Status: DC | PRN

## 2020-01-24 MED ORDER — TRANEXAMIC ACID-NACL 1000-0.7 MG/100ML-% IV SOLN
1000.0000 mg | INTRAVENOUS | Status: AC
  Administered 2020-01-24: 1000 mg via INTRAVENOUS
  Filled 2020-01-24: qty 100

## 2020-01-24 MED ORDER — 0.9 % SODIUM CHLORIDE (POUR BTL) OPTIME
TOPICAL | Status: DC | PRN
  Administered 2020-01-24: 1000 mL

## 2020-01-24 MED ORDER — FLEET ENEMA 7-19 GM/118ML RE ENEM: 1.0000 | ENEMA | Freq: Once | RECTAL | Status: DC | PRN

## 2020-01-24 MED ORDER — SODIUM CHLORIDE (PF) 0.9 % IJ SOLN
INTRAMUSCULAR | Status: DC | PRN
  Administered 2020-01-24: 60 mL

## 2020-01-24 MED ORDER — METHOCARBAMOL 500 MG PO TABS
500.0000 mg | ORAL_TABLET | Freq: Four times a day (QID) | ORAL | Status: DC | PRN
  Administered 2020-01-24: 500 mg via ORAL
  Filled 2020-01-24: qty 1

## 2020-01-24 MED ORDER — FENTANYL CITRATE (PF) 100 MCG/2ML IJ SOLN
50.0000 ug | INTRAMUSCULAR | Status: DC
  Administered 2020-01-24: 50 ug via INTRAVENOUS
  Filled 2020-01-24: qty 2

## 2020-01-24 MED ORDER — PROMETHAZINE HCL 25 MG/ML IJ SOLN: 6.2500 mg | INTRAMUSCULAR | Status: DC | PRN

## 2020-01-24 MED ORDER — MIDAZOLAM HCL 2 MG/2ML IJ SOLN
1.0000 mg | INTRAMUSCULAR | Status: DC
  Administered 2020-01-24: 1 mg via INTRAVENOUS
  Filled 2020-01-24: qty 2

## 2020-01-24 MED ORDER — POLYETHYLENE GLYCOL 3350 17 G PO PACK: 17.0000 g | PACK | Freq: Every day | ORAL | Status: DC | PRN

## 2020-01-24 MED ORDER — BUPIVACAINE-EPINEPHRINE (PF) 0.5% -1:200000 IJ SOLN
INTRAMUSCULAR | Status: DC | PRN
Start: 2020-01-24 — End: 2020-01-24
  Administered 2020-01-24: 15 mL via PERINEURAL

## 2020-01-24 MED ORDER — BUPIVACAINE LIPOSOME 1.3 % IJ SUSP
INTRAMUSCULAR | Status: DC | PRN
  Administered 2020-01-24: 20 mL

## 2020-01-24 MED ORDER — ONDANSETRON HCL 4 MG/2ML IJ SOLN
INTRAMUSCULAR | Status: AC
  Filled 2020-01-24: qty 2

## 2020-01-24 MED ORDER — ONDANSETRON HCL 4 MG/2ML IJ SOLN
INTRAMUSCULAR | Status: DC | PRN
  Administered 2020-01-24: 4 mg via INTRAVENOUS

## 2020-01-24 MED ORDER — OXYCODONE HCL 5 MG/5ML PO SOLN: 5.0000 mg | Freq: Once | ORAL | Status: DC | PRN

## 2020-01-24 MED ORDER — DULOXETINE HCL 60 MG PO CPEP
60.0000 mg | ORAL_CAPSULE | Freq: Every day | ORAL | Status: DC
  Administered 2020-01-25: 60 mg via ORAL
  Filled 2020-01-24: qty 1

## 2020-01-24 MED ORDER — METOPROLOL SUCCINATE ER 50 MG PO TB24
50.0000 mg | ORAL_TABLET | Freq: Every day | ORAL | Status: DC
  Administered 2020-01-25: 50 mg via ORAL
  Filled 2020-01-24: qty 1

## 2020-01-24 MED ORDER — PROPOFOL 500 MG/50ML IV EMUL
INTRAVENOUS | Status: DC | PRN
  Administered 2020-01-24: 75 ug/kg/min via INTRAVENOUS

## 2020-01-24 MED ORDER — METOCLOPRAMIDE HCL 5 MG/ML IJ SOLN: 5.0000 mg | Freq: Three times a day (TID) | INTRAMUSCULAR | Status: DC | PRN

## 2020-01-24 MED ORDER — MENTHOL 3 MG MT LOZG: 1.0000 | LOZENGE | OROMUCOSAL | Status: DC | PRN

## 2020-01-24 MED ORDER — ACETAMINOPHEN 500 MG PO TABS
1000.0000 mg | ORAL_TABLET | Freq: Four times a day (QID) | ORAL | Status: DC
  Administered 2020-01-24 – 2020-01-25 (×3): 1000 mg via ORAL
  Filled 2020-01-24 (×3): qty 2

## 2020-01-24 MED ORDER — STERILE WATER FOR IRRIGATION IR SOLN
Status: DC | PRN
  Administered 2020-01-24: 2000 mL

## 2020-01-24 MED ORDER — DEXAMETHASONE SODIUM PHOSPHATE 10 MG/ML IJ SOLN
INTRAMUSCULAR | Status: AC
  Filled 2020-01-24: qty 1

## 2020-01-24 MED ORDER — FENTANYL CITRATE (PF) 100 MCG/2ML IJ SOLN: 25.0000 ug | INTRAMUSCULAR | Status: DC | PRN

## 2020-01-24 MED ORDER — LOSARTAN POTASSIUM 50 MG PO TABS
100.0000 mg | ORAL_TABLET | Freq: Every day | ORAL | Status: DC
  Administered 2020-01-25: 100 mg via ORAL
  Filled 2020-01-24: qty 2

## 2020-01-24 MED ORDER — ASPIRIN EC 325 MG PO TBEC
325.0000 mg | DELAYED_RELEASE_TABLET | Freq: Every day | ORAL | Status: DC
  Administered 2020-01-25: 325 mg via ORAL
  Filled 2020-01-24: qty 1

## 2020-01-24 MED ORDER — SODIUM CHLORIDE (PF) 0.9 % IJ SOLN
INTRAMUSCULAR | Status: AC
  Filled 2020-01-24: qty 50

## 2020-01-24 MED ORDER — CEFAZOLIN SODIUM-DEXTROSE 2-4 GM/100ML-% IV SOLN
2.0000 g | Freq: Four times a day (QID) | INTRAVENOUS | Status: AC
  Administered 2020-01-24 (×2): 2 g via INTRAVENOUS
  Filled 2020-01-24 (×2): qty 100

## 2020-01-24 MED ORDER — POVIDONE-IODINE 10 % EX SWAB
2.0000 "application " | Freq: Once | CUTANEOUS | Status: AC
  Administered 2020-01-24: 2 via TOPICAL

## 2020-01-24 MED ORDER — HYDROMORPHONE HCL 2 MG PO TABS
2.0000 mg | ORAL_TABLET | ORAL | Status: DC | PRN
  Administered 2020-01-24 – 2020-01-25 (×3): 2 mg via ORAL
  Filled 2020-01-24 (×3): qty 1

## 2020-01-24 MED ORDER — LIDOCAINE 2% (20 MG/ML) 5 ML SYRINGE
INTRAMUSCULAR | Status: DC | PRN
  Administered 2020-01-24: 40 mg via INTRAVENOUS

## 2020-01-24 MED ORDER — METHOCARBAMOL 500 MG IVPB - SIMPLE MED
500.0000 mg | Freq: Four times a day (QID) | INTRAVENOUS | Status: DC | PRN
  Filled 2020-01-24: qty 50

## 2020-01-24 MED ORDER — SODIUM CHLORIDE 0.9 % IR SOLN
Status: DC | PRN
  Administered 2020-01-24: 1000 mL

## 2020-01-24 MED ORDER — BISACODYL 10 MG RE SUPP: 10.0000 mg | Freq: Every day | RECTAL | Status: DC | PRN

## 2020-01-24 MED ORDER — SODIUM CHLORIDE 0.9 % IV SOLN: INTRAVENOUS | Status: DC

## 2020-01-24 MED ORDER — DEXAMETHASONE SODIUM PHOSPHATE 10 MG/ML IJ SOLN
8.0000 mg | Freq: Once | INTRAMUSCULAR | Status: AC
  Administered 2020-01-24: 8 mg via INTRAVENOUS

## 2020-01-24 MED ORDER — DIPHENHYDRAMINE HCL 12.5 MG/5ML PO ELIX: 12.5000 mg | ORAL_SOLUTION | ORAL | Status: DC | PRN

## 2020-01-24 MED ORDER — HYDROCHLOROTHIAZIDE 25 MG PO TABS
25.0000 mg | ORAL_TABLET | Freq: Every day | ORAL | Status: DC
  Administered 2020-01-25: 25 mg via ORAL
  Filled 2020-01-24: qty 1

## 2020-01-24 MED ORDER — ONDANSETRON HCL 4 MG/2ML IJ SOLN: 4.0000 mg | Freq: Four times a day (QID) | INTRAMUSCULAR | Status: DC | PRN

## 2020-01-24 MED ORDER — ORAL CARE MOUTH RINSE: 15.0000 mL | Freq: Once | OROMUCOSAL | Status: AC

## 2020-01-24 MED ORDER — SODIUM CHLORIDE (PF) 0.9 % IJ SOLN
INTRAMUSCULAR | Status: AC
  Filled 2020-01-24: qty 10

## 2020-01-24 MED ORDER — PROPOFOL 1000 MG/100ML IV EMUL
INTRAVENOUS | Status: AC
  Filled 2020-01-24: qty 100

## 2020-01-24 MED ORDER — PROPOFOL 500 MG/50ML IV EMUL
INTRAVENOUS | Status: DC | PRN
  Administered 2020-01-24 (×2): 20 mg via INTRAVENOUS

## 2020-01-24 MED ORDER — MORPHINE SULFATE (PF) 2 MG/ML IV SOLN: 0.5000 mg | INTRAVENOUS | Status: DC | PRN

## 2020-01-24 MED ORDER — OXYCODONE HCL 5 MG PO TABS: 5.0000 mg | ORAL_TABLET | Freq: Once | ORAL | Status: DC | PRN

## 2020-01-24 MED ORDER — BUPIVACAINE IN DEXTROSE 0.75-8.25 % IT SOLN
INTRATHECAL | Status: DC | PRN
  Administered 2020-01-24: 1.7 mL via INTRATHECAL

## 2020-01-24 MED ORDER — CHLORHEXIDINE GLUCONATE 0.12 % MT SOLN
15.0000 mL | Freq: Once | OROMUCOSAL | Status: AC
  Administered 2020-01-24: 15 mL via OROMUCOSAL

## 2020-01-24 MED ORDER — DOCUSATE SODIUM 100 MG PO CAPS
100.0000 mg | ORAL_CAPSULE | Freq: Two times a day (BID) | ORAL | Status: DC
  Administered 2020-01-24 – 2020-01-25 (×2): 100 mg via ORAL
  Filled 2020-01-24 (×2): qty 1

## 2020-01-24 MED ORDER — ACETAMINOPHEN 10 MG/ML IV SOLN
1000.0000 mg | Freq: Four times a day (QID) | INTRAVENOUS | Status: DC
  Filled 2020-01-24: qty 100

## 2020-01-24 MED ORDER — PHENOL 1.4 % MT LIQD: 1.0000 | OROMUCOSAL | Status: DC | PRN

## 2020-01-24 SURGICAL SUPPLY — 55 items
ATTUNE MED DOME PAT 38 KNEE (Knees) ×2 IMPLANT
ATTUNE MED DOME PAT 38MM KNEE (Knees) ×1 IMPLANT
ATTUNE PS FEM RT SZ 7 CEM KNEE (Femur) ×3 IMPLANT
ATTUNE PSRP INSR SZ7 8 KNEE (Insert) ×2 IMPLANT
ATTUNE PSRP INSR SZ7 8MM KNEE (Insert) ×1 IMPLANT
BAG ZIPLOCK 12X15 (MISCELLANEOUS) ×3 IMPLANT
BASE TIBIAL ROT PLAT SZ 7 KNEE (Knees) ×1 IMPLANT
BLADE SAG 18X100X1.27 (BLADE) ×3 IMPLANT
BLADE SAW SGTL 11.0X1.19X90.0M (BLADE) ×3 IMPLANT
BLADE SURG SZ10 CARB STEEL (BLADE) ×6 IMPLANT
BNDG ELASTIC 6X5.8 VLCR STR LF (GAUZE/BANDAGES/DRESSINGS) ×3 IMPLANT
BOWL SMART MIX CTS (DISPOSABLE) ×3 IMPLANT
CEMENT HV SMART SET (Cement) ×6 IMPLANT
CLOSURE WOUND 1/2 X4 (GAUZE/BANDAGES/DRESSINGS) ×2
COVER SURGICAL LIGHT HANDLE (MISCELLANEOUS) ×3 IMPLANT
COVER WAND RF STERILE (DRAPES) IMPLANT
CUFF TOURN SGL QUICK 34 (TOURNIQUET CUFF) ×3
CUFF TRNQT CYL 34X4.125X (TOURNIQUET CUFF) ×1 IMPLANT
DECANTER SPIKE VIAL GLASS SM (MISCELLANEOUS) ×3 IMPLANT
DRAPE U-SHAPE 47X51 STRL (DRAPES) ×3 IMPLANT
DRSG AQUACEL AG ADV 3.5X10 (GAUZE/BANDAGES/DRESSINGS) ×3 IMPLANT
DURAPREP 26ML APPLICATOR (WOUND CARE) ×3 IMPLANT
ELECT REM PT RETURN 15FT ADLT (MISCELLANEOUS) ×3 IMPLANT
GLOVE BIO SURGEON STRL SZ7 (GLOVE) ×3 IMPLANT
GLOVE BIO SURGEON STRL SZ8 (GLOVE) ×3 IMPLANT
GLOVE BIOGEL PI IND STRL 7.0 (GLOVE) ×1 IMPLANT
GLOVE BIOGEL PI IND STRL 8 (GLOVE) ×1 IMPLANT
GLOVE BIOGEL PI INDICATOR 7.0 (GLOVE) ×2
GLOVE BIOGEL PI INDICATOR 8 (GLOVE) ×2
GOWN STRL REUS W/TWL LRG LVL3 (GOWN DISPOSABLE) ×6 IMPLANT
HANDPIECE INTERPULSE COAX TIP (DISPOSABLE) ×3
HOLDER FOLEY CATH W/STRAP (MISCELLANEOUS) IMPLANT
IMMOBILIZER KNEE 20 (SOFTGOODS) ×3
IMMOBILIZER KNEE 20 THIGH 36 (SOFTGOODS) ×1 IMPLANT
KIT TURNOVER KIT A (KITS) IMPLANT
MANIFOLD NEPTUNE II (INSTRUMENTS) ×3 IMPLANT
NS IRRIG 1000ML POUR BTL (IV SOLUTION) ×3 IMPLANT
PACK TOTAL KNEE CUSTOM (KITS) ×3 IMPLANT
PADDING CAST COTTON 6X4 STRL (CAST SUPPLIES) ×3 IMPLANT
PENCIL SMOKE EVACUATOR (MISCELLANEOUS) IMPLANT
PIN DRILL FIX HALF THREAD (BIT) ×3 IMPLANT
PIN STEINMAN FIXATION KNEE (PIN) ×3 IMPLANT
PROTECTOR NERVE ULNAR (MISCELLANEOUS) ×3 IMPLANT
SET HNDPC FAN SPRY TIP SCT (DISPOSABLE) ×1 IMPLANT
STRIP CLOSURE SKIN 1/2X4 (GAUZE/BANDAGES/DRESSINGS) ×4 IMPLANT
SUT MNCRL AB 4-0 PS2 18 (SUTURE) ×3 IMPLANT
SUT STRATAFIX 0 PDS 27 VIOLET (SUTURE) ×3
SUT VIC AB 2-0 CT1 27 (SUTURE) ×9
SUT VIC AB 2-0 CT1 TAPERPNT 27 (SUTURE) ×3 IMPLANT
SUTURE STRATFX 0 PDS 27 VIOLET (SUTURE) ×1 IMPLANT
TIBIAL BASE ROT PLAT SZ 7 KNEE (Knees) ×3 IMPLANT
TRAY FOLEY MTR SLVR 16FR STAT (SET/KITS/TRAYS/PACK) ×3 IMPLANT
WATER STERILE IRR 1000ML POUR (IV SOLUTION) ×6 IMPLANT
WRAP KNEE MAXI GEL POST OP (GAUZE/BANDAGES/DRESSINGS) ×3 IMPLANT
YANKAUER SUCT BULB TIP 10FT TU (MISCELLANEOUS) ×3 IMPLANT

## 2020-01-24 NOTE — Progress Notes (Signed)
Orthopedic Tech Progress Note Patient Details:  Samuel Chambers Apr 19, 1951 [id number]  CPM Right Knee CPM Right Knee: Off Right Knee Flexion (Degrees): 40 Right Knee Extension (Degrees): 10 Additional Comments: Trapeze bar     [place] Pink 01/24/2020, 4:31 PM

## 2020-01-24 NOTE — Anesthesia Postprocedure Evaluation (Signed)
Anesthesia Post Note  Patient: Samuel Chambers  Procedure(s) Performed: TOTAL KNEE ARTHROPLASTY (Right Knee)     Patient location during evaluation: PACU Anesthesia Type: Spinal Level of consciousness: awake and alert and oriented Pain management: pain level controlled Vital Signs Assessment: post-procedure vital signs reviewed and stable Respiratory status: spontaneous breathing, nonlabored ventilation and respiratory function stable Cardiovascular status: blood pressure returned to baseline Postop Assessment: no apparent nausea or vomiting, spinal receding, no headache and no backache Anesthetic complications: no   No complications documented.  Last Vitals:  Vitals:   01/24/20 1415 01/24/20 1430  BP: 125/73 133/79  Pulse: 63 63  Resp: 16 17  Temp:  36.6 C  SpO2: 98% 98%    Last Pain:  Vitals:   01/24/20 1415  TempSrc:   PainSc: 0-No pain                 Brennan Bailey

## 2020-01-24 NOTE — Interval H&P Note (Signed)
History and Physical Interval Note:  01/24/2020 9:40 AM  Samuel Chambers  has presented today for surgery, with the diagnosis of right knee osteoarthritis.  The various methods of treatment have been discussed with the patient and family. After consideration of risks, benefits and other options for treatment, the patient has consented to  Procedure(s) with comments: TOTAL KNEE ARTHROPLASTY (Right) - 27min as a surgical intervention.  The patient's history has been reviewed, patient examined, no change in status, stable for surgery.  I have reviewed the patient's chart and labs.  Questions were answered to the patient's satisfaction.     Pilar Plate Hera Celaya

## 2020-01-24 NOTE — Care Plan (Signed)
Ortho Bundle Case Management Note  Patient Details  Name: Samuel Chambers MRN: [id number] Date of Birth: 1951-02-14  R TKA on 01-24-20 DCP:  Home with wife.  1 story home with 5 ste.  DME:  No needs.  Has a RW and 3-in-1. PT:  [facility].  PT eval scheduled on 01-27-20.                   DME Arranged:  N/A DME Agency:  NA  HH Arranged:  NA HH Agency:  NA  Additional Comments: Please contact me with any questions of if this plan should need to change.  Marianne Sofia, RN,CCM [facility]  [phone] 01/24/2020, 3:25 PM

## 2020-01-24 NOTE — Discharge Instructions (Addendum)
Samuel Arabian, MD Total Joint Specialist [facility] [address] [place], [place] [address] [phone]  TOTAL KNEE REPLACEMENT POSTOPERATIVE DIRECTIONS    Knee Rehabilitation, Guidelines Following Surgery  Results after knee surgery are often greatly improved when you follow the exercise, range of motion and muscle strengthening exercises prescribed by your doctor. Safety measures are also important to protect the knee from further injury. If any of these exercises cause you to have increased pain or swelling in your knee joint, decrease the amount until you are comfortable again and slowly increase them. If you have problems or questions, call your caregiver or physical therapist for advice.   BLOOD CLOT PREVENTION . Take a 325 mg Aspirin two times a day with your usual Plavix dose for three weeks following surgery. Then take an 81 mg Aspirin once a day with your usual Plavix dose for three weeks. Then discontinue Aspirin. Dennis Bast may resume your vitamins/supplements upon discharge from the hospital. . Do not take any NSAIDs (Advil, Aleve, Ibuprofen, Meloxicam, etc.) until you have discontinued the 325 mg Aspirin.  HOME CARE INSTRUCTIONS  . Remove items at home which could result in a fall. This includes throw rugs or furniture in walking pathways.  . ICE to the affected knee as much as tolerated. Icing helps control swelling. If the swelling is well controlled you will be more comfortable and rehab easier. Continue to use ice on the knee for pain and swelling from surgery. You may notice swelling that will progress down to the foot and ankle. This is normal after surgery. Elevate the leg when you are not up walking on it.    . Continue to use the breathing machine which will help keep your temperature down. It is common for your temperature to cycle up and down following surgery, especially at night when you are not up moving around and exerting yourself. The  breathing machine keeps your lungs expanded and your temperature down. . Do not place pillow under the operative knee, focus on keeping the knee straight while resting  DIET You may resume your previous home diet once you are discharged from the hospital.  DRESSING / [place] / SHOWERING . Keep your bulky bandage on for 2 days. On the third post-operative day you may remove the Ace bandage and gauze. There is a waterproof adhesive bandage on your skin which will stay in place until your first follow-up appointment. Once you remove this you will not need to place another bandage . You may begin showering 3 days following surgery, but do not submerge the incision under water.  ACTIVITY For the first 5 days, the key is rest and control of pain and swelling . Do your home exercises twice a day starting on post-operative day 3. On the days you go to physical therapy, just do the home exercises once that day. . You should rest, ice and elevate the leg for 50 minutes out of every hour. Get up and walk/stretch for 10 minutes per hour. After 5 days you can increase your activity slowly as tolerated. . Walk with your walker as instructed. Use the walker until you are comfortable transitioning to a cane. Walk with the cane in the opposite hand of the operative leg. You may discontinue the cane once you are comfortable and walking steadily. . Avoid periods of inactivity such as sitting longer than an hour when not asleep. This helps prevent blood clots.  . You may discontinue the  knee immobilizer once you are able to perform a straight leg raise while lying down. . You may resume a sexual relationship in one month or when given the OK by your doctor.  . You may return to work once you are cleared by your doctor.  . Do not drive a car for 6 weeks or until released by your surgeon.  . Do not drive while taking narcotics.  TED HOSE STOCKINGS Wear the elastic stockings on both legs for three weeks following  surgery during the day. You may remove them at night for sleeping.  WEIGHT BEARING Weight bearing as tolerated with assist device (walker, cane, etc) as directed, use it as long as suggested by your surgeon or therapist, typically at least 4-6 weeks.  POSTOPERATIVE CONSTIPATION PROTOCOL Constipation - defined medically as fewer than three stools per week and severe constipation as less than one stool per week.  One of the most common issues patients have following surgery is constipation.  Even if you have a regular bowel pattern at home, your normal regimen is likely to be disrupted due to multiple reasons following surgery.  Combination of anesthesia, postoperative narcotics, change in appetite and fluid intake all can affect your bowels.  In order to avoid complications following surgery, here are some recommendations in order to help you during your recovery period.  . Colace (docusate) - Pick up an over-the-counter form of Colace or another stool softener and take twice a day as long as you are requiring postoperative pain medications.  Take with a full glass of water daily.  If you experience loose stools or diarrhea, hold the colace until you stool forms back up. If your symptoms do not get better within 1 week or if they get worse, check with your doctor. . Dulcolax (bisacodyl) - Pick up over-the-counter and take as directed by the product packaging as needed to assist with the movement of your bowels.  Take with a full glass of water.  Use this product as needed if not relieved by Colace only.  . MiraLax (polyethylene glycol) - Pick up over-the-counter to have on hand. MiraLax is a solution that will increase the amount of water in your bowels to assist with bowel movements.  Take as directed and can mix with a glass of water, juice, soda, coffee, or tea. Take if you go more than two days without a movement. Do not use MiraLax more than once per day. Call your doctor if you are still constipated  or irregular after using this medication for 7 days in a row.  If you continue to have problems with postoperative constipation, please contact the office for further assistance and recommendations.  If you experience "the worst abdominal pain ever" or develop nausea or vomiting, please contact the office immediatly for further recommendations for treatment.  ITCHING If you experience itching with your medications, try taking only a single pain pill, or even half a pain pill at a time.  You can also use Benadryl over the counter for itching or also to help with sleep.   MEDICATIONS See your medication summary on the "After Visit Summary" that the nursing staff will review with you prior to discharge.  You may have some home medications which will be placed on hold until you complete the course of blood thinner medication.  It is important for you to complete the blood thinner medication as prescribed by your surgeon.  Continue your approved medications as instructed at time of discharge.  PRECAUTIONS . If you experience chest pain or shortness of breath - call 911 immediately for transfer to the hospital emergency department.  . If you develop a fever greater that 101 F, purulent drainage from wound, increased redness or drainage from wound, foul odor from the wound/dressing, or calf pain - CONTACT YOUR SURGEON.                                                   FOLLOW-UP APPOINTMENTS Make sure you keep all of your appointments after your operation with your surgeon and caregivers. You should call the office at the above phone number and make an appointment for approximately two weeks after the date of your surgery or on the date instructed by your surgeon outlined in the "After Visit Summary".  RANGE OF MOTION AND STRENGTHENING EXERCISES  Rehabilitation of the knee is important following a knee injury or an operation. After just a few days of immobilization, the muscles of the thigh which control  the knee become weakened and shrink (atrophy). Knee exercises are designed to build up the tone and strength of the thigh muscles and to improve knee motion. Often times heat used for twenty to thirty minutes before working out will loosen up your tissues and help with improving the range of motion but do not use heat for the first two weeks following surgery. These exercises can be done on a training (exercise) mat, on the floor, on a table or on a bed. Use what ever works the best and is most comfortable for you Knee exercises include:  . Leg Lifts - While your knee is still immobilized in a splint or cast, you can do straight leg raises. Lift the leg to 60 degrees, hold for 3 sec, and slowly lower the leg. Repeat 10-20 times 2-3 times daily. Perform this exercise against resistance later as your knee gets better.  Javier Docker and Hamstring Sets - Tighten up the muscle on the front of the thigh (Quad) and hold for 5-10 sec. Repeat this 10-20 times hourly. Hamstring sets are done by pushing the foot backward against an object and holding for 5-10 sec. Repeat as with quad sets.   Leg Slides: Lying on your back, slowly slide your foot toward your buttocks, bending your knee up off the floor (only go as far as is comfortable). Then slowly slide your foot back down until your leg is flat on the floor again.  Angel Wings: Lying on your back spread your legs to the side as far apart as you can without causing discomfort.  A rehabilitation program following serious knee injuries can speed recovery and prevent re-injury in the future due to weakened muscles. Contact your doctor or a physical therapist for more information on knee rehabilitation.   IF YOU ARE TRANSFERRED TO A SKILLED REHAB FACILITY If the patient is transferred to a skilled rehab facility following release from the hospital, a list of the current medications will be sent to the facility for the patient to continue.  When discharged from the skilled  rehab facility, please have the facility set up the patient's [place] prior to being released. Also, the skilled facility will be responsible for providing the patient with their medications at time of release from the facility to include their pain medication, the muscle relaxants, and their  blood thinner medication. If the patient is still at the rehab facility at time of the two week follow up appointment, the skilled rehab facility will also need to assist the patient in arranging follow up appointment in our office and any transportation needs.  MAKE SURE YOU:  . Understand these instructions.  . Get help right away if you are not doing well or get worse.   DENTAL ANTIBIOTICS:  In most cases prophylactic antibiotics for Dental procdeures after total joint surgery are not necessary.  Exceptions are as follows:  1. History of prior total joint infection  2. Severely immunocompromised (Organ Transplant, cancer chemotherapy, Rheumatoid biologic meds such as [place])  3. Poorly controlled diabetes (A1C &gt; 8.0, blood glucose over 200)  If you have one of these conditions, contact your surgeon for an antibiotic prescription, prior to your dental procedure.    Pick up stool softner and laxative for home use following surgery while on pain medications. Do not submerge incision under water. Please use good hand washing techniques while changing dressing each day. May shower starting three days after surgery. Please use a clean towel to pat the incision dry following showers. Continue to use ice for pain and swelling after surgery. Do not use any lotions or creams on the incision until instructed by your surgeon.

## 2020-01-24 NOTE — Plan of Care (Signed)
  Problem: Education: Goal: Knowledge of the prescribed therapeutic regimen will improve Outcome: Progressing   Problem: Activity: Goal: Ability to avoid complications of mobility impairment will improve Outcome: Progressing Goal: Range of joint motion will improve Outcome: Progressing   Problem: Pain Management: Goal: Pain level will decrease with appropriate interventions Outcome: Progressing   

## 2020-01-24 NOTE — Op Note (Signed)
OPERATIVE REPORT-TOTAL KNEE ARTHROPLASTY   Pre-operative diagnosis- Osteoarthritis  Right knee(s)  Post-operative diagnosis- Osteoarthritis Right knee(s)  Procedure-  Right  Total Knee Arthroplasty  Surgeon- Samuel Plover. Samuel Stuckey, MD  Assistant- Molli Barrows, PA-C   Anesthesia-  Adductor canal block and spinal  EBL-25 mL   Drains Hemovac  Tourniquet time-  Total Tourniquet Time Documented: Thigh (Right) - 39 minutes Total: Thigh (Right) - 39 minutes     Complications- None  Condition-PACU - hemodynamically stable.   Brief Clinical Note  Samuel Chambers is a 69 y.o. year old male with end stage OA of his right knee with progressively worsening pain and dysfunction. He has constant pain, with activity and at rest and significant functional deficits with difficulties even with ADLs. He has had extensive non-op management including analgesics, injections of cortisone and viscosupplements, and home exercise program, but remains in significant pain with significant dysfunction. Radiographs show bone on bone arthritis bone on bone medial and patellofemoral. He presents now for right Total Knee Arthroplasty.    Procedure in detail---   The patient is brought into the operating room and positioned supine on the operating table. After successful administration of  Adductor canal block and spinal, a tourniquet is placed high on the  Right thigh(s) and the lower extremity is prepped and draped in the usual sterile fashion. Time out is performed by the operating team and then the  Right lower extremity is wrapped in Esmarch, knee flexed and the tourniquet inflated to 300 mmHg.       A midline incision is made with a ten blade through the subcutaneous tissue to the level of the extensor mechanism. A fresh blade is used to make a medial parapatellar arthrotomy. Soft tissue over the proximal medial tibia is subperiosteally elevated to the joint line with a knife and into the semimembranosus bursa  with a Cobb elevator. Soft tissue over the proximal lateral tibia is elevated with attention being paid to avoiding the patellar tendon on the tibial tubercle. The patella is everted, knee flexed 90 degrees and the ACL and PCL are removed. Findings are bone on bone medial and patellofemoral with large global osteophytes.        The drill is used to create a starting hole in the distal femur and the canal is thoroughly irrigated with sterile saline to remove the fatty contents. The 5 degree Right  valgus alignment guide is placed into the femoral canal and the distal femoral cutting block is pinned to remove 9 mm off the distal femur. Resection is made with an oscillating saw.      The tibia is subluxed forward and the menisci are removed. The extramedullary alignment guide is placed referencing proximally at the medial aspect of the tibial tubercle and distally along the second metatarsal axis and tibial crest. The block is pinned to remove 77mm off the more deficient medial  side. Resection is made with an oscillating saw. Size 7is the most appropriate size for the tibia and the proximal tibia is prepared with the modular drill and keel punch for that size.      The femoral sizing guide is placed and size 7 is most appropriate. Rotation is marked off the epicondylar axis and confirmed by creating a rectangular flexion gap at 90 degrees. The size 7 cutting block is pinned in this rotation and the anterior, posterior and chamfer cuts are made with the oscillating saw. The intercondylar block is then placed and that cut is made.  Trial size 7 tibial component, trial size 7 posterior stabilized femur and a 8  mm posterior stabilized rotating platform insert trial is placed. Full extension is achieved with excellent varus/valgus and anterior/posterior balance throughout full range of motion. The patella is everted and thickness measured to be 24  mm. Free hand resection is taken to 14 mm, a 38 template is placed,  lug holes are drilled, trial patella is placed, and it tracks normally. Osteophytes are removed off the posterior femur with the trial in place. All trials are removed and the cut bone surfaces prepared with pulsatile lavage. Cement is mixed and once ready for implantation, the size 7 tibial implant, size  7 posterior stabilized femoral component, and the size 38 patella are cemented in place and the patella is held with the clamp. The trial insert is placed and the knee held in full extension. The Exparel (20 ml mixed with 60 ml saline) is injected into the extensor mechanism, posterior capsule, medial and lateral gutters and subcutaneous tissues.  All extruded cement is removed and once the cement is hard the permanent 8 mm posterior stabilized rotating platform insert is placed into the tibial tray.      The wound is copiously irrigated with saline solution and the extensor mechanism closed over a hemovac drain with #1 V-loc suture. The tourniquet is released for a total tourniquet time of 39  minutes. Flexion against gravity is 140 degrees and the patella tracks normally. Subcutaneous tissue is closed with 2.0 vicryl and subcuticular with running 4.0 Monocryl. The incision is cleaned and dried and steri-strips and a bulky sterile dressing are applied. The limb is placed into a knee immobilizer and the patient is awakened and transported to recovery in stable condition.      Please note that a surgical assistant was a medical necessity for this procedure in order to perform it in a safe and expeditious manner. Surgical assistant was necessary to retract the ligaments and vital neurovascular structures to prevent injury to them and also necessary for proper positioning of the limb to allow for anatomic placement of the prosthesis.   Samuel Plover Wetzel Meester, MD    01/24/2020, 1:00 PM

## 2020-01-24 NOTE — Progress Notes (Signed)
Assisted Dr. Howze with right, ultrasound guided, adductor canal block. Side rails up, monitors on throughout procedure. See vital signs in flow sheet. Tolerated Procedure well.  

## 2020-01-24 NOTE — Transfer of Care (Signed)
Immediate Anesthesia Transfer of Care Note  Patient: Samuel Chambers  Procedure(s) Performed: TOTAL KNEE ARTHROPLASTY (Right Knee)  Patient Location: PACU  Anesthesia Type:MAC and Spinal  Level of Consciousness: drowsy, patient cooperative and responds to stimulation  Airway & Oxygen Therapy: Patient Spontanous Breathing and Patient connected to face mask oxygen  Post-op Assessment: Report given to RN and Post -op Vital signs reviewed and stable  Post vital signs: Reviewed and stable  Last Vitals:  Vitals Value Taken Time  BP 111/67 01/24/20 1323  Temp 37 C 01/24/20 1323  Pulse 68 01/24/20 1324  Resp 17 01/24/20 1324  SpO2 98 % 01/24/20 1324  Vitals shown include unvalidated device data.  Last Pain:  Vitals:   01/24/20 1131  TempSrc:   PainSc: 0-No pain      Patients Stated Pain Goal: 4 (29/79/89 2119)  Complications: No complications documented.

## 2020-01-24 NOTE — Anesthesia Procedure Notes (Signed)
Anesthesia Regional Block: Adductor canal block   Pre-Anesthetic Checklist: ,, timeout performed, Correct Patient, Correct Site, Correct Laterality, Correct Procedure, Correct Position, site marked, Risks and benefits discussed, pre-op evaluation,  At surgeon's request and post-op pain management  Laterality: Right  Prep: Maximum Sterile Barrier Precautions used, chloraprep       Needles:  Injection technique: Single-shot  Needle Type: Echogenic Stimulator Needle     Needle Length: 9cm  Needle Gauge: 22     Additional Needles:   Procedures:,,,, ultrasound used (permanent image in chart),,,,  Narrative:  Start time: 01/24/2020 11:21 AM End time: 01/24/2020 11:24 AM Injection made incrementally with aspirations every 5 mL.  Performed by: Personally  Anesthesiologist: Brennan Bailey, MD  Additional Notes: Risks, benefits, and alternative discussed. Patient gave consent for procedure. Patient prepped and draped in sterile fashion. Sedation administered, patient remains easily responsive to voice. Relevant anatomy identified with ultrasound guidance. Local anesthetic given in 5cc increments with no signs or symptoms of intravascular injection. No pain or paraesthesias with injection. Patient monitored throughout procedure with signs of LAST or immediate complications. Tolerated well. Ultrasound image placed in chart.  Tawny Asal, MD

## 2020-01-24 NOTE — Anesthesia Procedure Notes (Signed)
Spinal  Patient location during procedure: OR Start time: 01/24/2020 11:46 AM End time: 01/24/2020 11:52 AM Staffing Performed: resident/CRNA and anesthesiologist  Anesthesiologist: Brennan Bailey, MD Resident/CRNA: Glory Buff, CRNA Preanesthetic Checklist Completed: patient identified, IV checked, site marked, risks and benefits discussed, surgical consent, monitors and equipment checked, pre-op evaluation and timeout performed Spinal Block Patient position: sitting Prep: DuraPrep Patient monitoring: heart rate, cardiac monitor, continuous pulse ox and blood pressure Approach: midline Location: L3-4 Injection technique: single-shot Needle Needle type: Pencan  Needle gauge: 24 G Needle length: 9 cm Assessment Sensory level: T6 Additional Notes Kit expiration date checked and verified.  Sterile prep and drape, skin local with 1% lidocaine, stick x 1, - heme, - paraesthesia, + CSF pre and post injection, patient tolerated procedure well.

## 2020-01-24 NOTE — Evaluation (Signed)
Physical Therapy Evaluation Patient Details Name: Samuel Chambers MRN: [id number] DOB: [birth year] Today's Date: 01/24/2020   History of Present Illness  Patient is 69 y.o. male s/p Rt TKA on 01/24/20 with PMH significant for HTN, HLD, GERD, fibromyalgia, CAD s/p angioplasty, DM, Lt TKA In 2014, Bil THA.    Clinical Impression  Samuel Chambers is a 69 y.o. male POD 0 s/p Rt TKA. Patient reports independence with mobility at baseline. Patient is now limited by functional impairments (see PT problem list below) and requires min assist for transfers and gait with RW. Patient was able to ambulate ~140 feet with RW and min guard/assist. Patient instructed in exercise to facilitate ROM and circulation. Patient will benefit from continued skilled PT interventions to address impairments and progress towards PLOF. Acute PT will follow to progress mobility and stair training in preparation for safe discharge home.     Follow Up Recommendations Follow surgeon's recommendation for DC plan and follow-up therapies    Equipment Recommendations  None recommended by PT    Recommendations for Other Services       Precautions / Restrictions Precautions Precautions: Fall Restrictions Weight Bearing Restrictions: No Other Position/Activity Restrictions: WBAT      Mobility  Bed Mobility Overal bed mobility: Needs Assistance Bed Mobility: Supine to Sit     Supine to sit: Min guard;HOB elevated     General bed mobility comments: cues for sequencing, no assist required, pt using bed rail.  Transfers Overall transfer level: Needs assistance Equipment used: Rolling walker (2 wheeled) Transfers: Sit to/from Stand Sit to Stand: Min guard;Min assist         General transfer comment: cues for technique with RW, light assist/guard to rise.  Ambulation/Gait Ambulation/Gait assistance: Min assist;Min guard Gait Distance (Feet): 140 Feet Assistive device: Rolling walker (2 wheeled) Gait  Pattern/deviations: Step-to pattern;Decreased stride length;Decreased step length - left;Decreased weight shift to right Gait velocity: fair   General Gait Details: cues for safe step pattern and proximity to RW, no overt LOB noted.   Stairs            Wheelchair Mobility    Modified Rankin (Stroke Patients Only)       Balance Overall balance assessment: Needs assistance Sitting-balance support: Feet supported Sitting balance-Leahy Scale: Good     Standing balance support: During functional activity;Bilateral upper extremity supported Standing balance-Leahy Scale: Fair                  Pertinent Vitals/Pain Pain Assessment: 0-10 Pain Score: 3  Pain Location: Rt knee Pain Descriptors / Indicators: Aching;Discomfort;Sore Pain Intervention(s): Limited activity within patient's tolerance;Monitored during session;Repositioned;Ice applied    Home Living Family/patient expects to be discharged to:: Private residence Living Arrangements: Spouse/significant other Available Help at Discharge: Family Type of Home: House Home Access: Stairs to enter Entrance Stairs-Rails: Psychiatric nurse of Steps: 4+1 (potted plants on Lt going down (can be moved)) Home Layout: One level Home Equipment: Environmental consultant - 2 wheels;Cane - single point;Bedside commode;Crutches      Prior Function Level of Independence: Independent         Comments: pt enjoys fishing     Hand Dominance   Dominant Hand: Right    Extremity/Trunk Assessment   Upper Extremity Assessment Upper Extremity Assessment: Overall WFL for tasks assessed    Lower Extremity Assessment Lower Extremity Assessment: RLE deficits/detail RLE Deficits / Details: good quad activaiton, no extensor lag with SLR RLE Sensation: WNL RLE Coordination: WNL  Cervical / Trunk Assessment Cervical / Trunk Assessment: Normal  Communication   Communication: No difficulties  Cognition Arousal/Alertness:  Awake/alert Behavior During Therapy: WFL for tasks assessed/performed Overall Cognitive Status: Within Functional Limits for tasks assessed             General Comments      Exercises Total Joint Exercises Ankle Circles/Pumps: AROM;Both;Seated;20 reps Quad Sets: AROM;Right;10 reps;Seated Heel Slides: AROM;Right;10 reps;Seated   Assessment/Plan    PT Assessment Patient needs continued PT services  PT Problem List Decreased strength;Decreased range of motion;Decreased activity tolerance;Decreased balance;Decreased mobility;Decreased knowledge of use of DME;Decreased knowledge of precautions;Obesity       PT Treatment Interventions DME instruction;Gait training;Stair training;Functional mobility training;Therapeutic activities;Therapeutic exercise;Balance training;Patient/family education    PT Goals (Current goals can be found in the Care Plan section)  Acute Rehab PT Goals Patient Stated Goal: get recovered and go to church and fish PT Goal Formulation: With patient Time For Goal Achievement: 01/31/20 Potential to Achieve Goals: Good    Frequency 7X/week    AM-PAC PT "6 Clicks" Mobility  Outcome Measure Help needed turning from your back to your side while in a flat bed without using bedrails?: None Help needed moving from lying on your back to sitting on the side of a flat bed without using bedrails?: A Little Help needed moving to and from a bed to a chair (including a wheelchair)?: A Little Help needed standing up from a chair using your arms (e.g., wheelchair or bedside chair)?: A Little Help needed to walk in hospital room?: A Little Help needed climbing 3-5 steps with a railing? : A Little 6 Click Score: 19    End of Session Equipment Utilized During Treatment: Gait belt Activity Tolerance: Patient tolerated treatment well Patient left: in chair;with call bell/phone within reach;with chair alarm set;with family/visitor present Nurse Communication: Mobility  status PT Visit Diagnosis: Muscle weakness (generalized) (M62.81);Difficulty in walking, not elsewhere classified (R26.2)    Time: 6759-1638 PT Time Calculation (min) (ACUTE ONLY): 27 min   Charges:   PT Evaluation $PT Eval Low Complexity: 1 Low PT Treatments $Gait Training: 8-22 mins      Verner Mould, DPT Acute Rehabilitation Services  Office [phone] Pager [phone]  01/24/2020 5:00 PM

## 2020-01-24 NOTE — Progress Notes (Signed)
Orthopedic Tech Progress Note Patient Details:  Samuel Chambers 1951-02-15 [id number]  CPM Right Knee CPM Right Knee: On Right Knee Flexion (Degrees): 40 Right Knee Extension (Degrees): 10 Additional Comments: Trapeze bar     [place] Pink 01/24/2020, 1:29 PM

## 2020-01-25 ENCOUNTER — Encounter (HOSPITAL_COMMUNITY): Payer: Self-pay | Admitting: Orthopedic Surgery

## 2020-01-25 DIAGNOSIS — M1711 Unilateral primary osteoarthritis, right knee: Secondary | ICD-10-CM | POA: Diagnosis not present

## 2020-01-25 LAB — CBC
HCT: 35.6 % — ABNORMAL LOW (ref 39.0–52.0)
Hemoglobin: 12.1 g/dL — ABNORMAL LOW (ref 13.0–17.0)
MCH: 29.3 pg (ref 26.0–34.0)
MCHC: 34 g/dL (ref 30.0–36.0)
MCV: 86.2 fL (ref 80.0–100.0)
Platelets: 215 10*3/uL (ref 150–400)
RBC: 4.13 MIL/uL — ABNORMAL LOW (ref 4.22–5.81)
RDW: 13.2 % (ref 11.5–15.5)
WBC: 14 10*3/uL — ABNORMAL HIGH (ref 4.0–10.5)
nRBC: 0 % (ref 0.0–0.2)

## 2020-01-25 LAB — BASIC METABOLIC PANEL
Anion gap: 11 (ref 5–15)
BUN: 16 mg/dL (ref 8–23)
CO2: 23 mmol/L (ref 22–32)
Calcium: 8.1 mg/dL — ABNORMAL LOW (ref 8.9–10.3)
Chloride: 102 mmol/L (ref 98–111)
Creatinine, Ser: 1.03 mg/dL (ref 0.61–1.24)
GFR calc Af Amer: >60 mL/min (ref >60)
GFR calc non Af Amer: >60 mL/min (ref >60)
Glucose, Bld: 187 mg/dL — ABNORMAL HIGH (ref 70–99)
Potassium: 4.1 mmol/L (ref 3.5–5.1)
Sodium: 136 mmol/L (ref 135–145)

## 2020-01-25 MED ORDER — ASPIRIN 325 MG PO TBEC: 325.0000 mg | DELAYED_RELEASE_TABLET | Freq: Every day | ORAL | 0 refills | Status: AC

## 2020-01-25 MED ORDER — HYDROMORPHONE HCL 2 MG PO TABS: 2.0000 mg | ORAL_TABLET | ORAL | 0 refills | Status: DC | PRN

## 2020-01-25 MED ORDER — METHOCARBAMOL 500 MG PO TABS: 500.0000 mg | ORAL_TABLET | Freq: Four times a day (QID) | ORAL | 0 refills | Status: DC | PRN

## 2020-01-25 NOTE — Progress Notes (Signed)
Subjective: 1 Day Post-Op Procedure(s) (LRB): TOTAL KNEE ARTHROPLASTY (Right) Samuel Chambers reports pain as mild.   Samuel Chambers seen in rounds by Dr. Wynelle Link. Samuel Chambers is well, and has had no acute complaints or problems. States he is ready to go home. Denies chest pain, SOB, or calf pain. No issues overnight. Foley catheter to be removed. We will continue therapy today, ambulated 140' yesterday.   Objective: Vital signs in last 24 hours: Temp:  [97.6 F (36.4 C)-98.9 F (37.2 C)] 97.6 F (36.4 C) (06/29 0610) Pulse Rate:  [63-85] 80 (06/29 0610) Resp:  [13-22] 18 (06/29 0610) BP: (108-153)/(67-96) 142/80 (06/29 0610) SpO2:  [95 %-100 %] 97 % (06/29 0610) Weight:  [967 kg] 123 kg (06/28 0927)  Intake/Output from previous day:  Intake/Output Summary (Last 24 hours) at 01/25/2020 0757 Last data filed at 01/25/2020 0600 Gross per 24 hour  Intake 2980.73 ml  Output 2575 ml  Net 405.73 ml     Intake/Output this shift: No intake/output data recorded.  Labs: Recent Labs    01/25/20 0311  HGB 12.1*   Recent Labs    01/25/20 0311  WBC 14.0*  RBC 4.13*  HCT 35.6*  PLT 215   Recent Labs    01/25/20 0311  NA 136  K 4.1  CL 102  CO2 23  BUN 16  CREATININE 1.03  GLUCOSE 187*  CALCIUM 8.1*   No results for input(s): LABPT, INR in the last 72 hours.  Exam: General - Samuel Chambers is Alert and Oriented Extremity - Neurologically intact Neurovascular intact Sensation intact distally Dorsiflexion/Plantar flexion intact Dressing - dressing C/D/I Motor Function - intact, moving foot and toes well on exam.   Past Medical History:  Diagnosis Date   Adenomatous colon polyp 03/2005   Anxiety    CAD S/P percutaneous coronary angioplasty 2008   PCI to circumflex with a Promus DES 2.5 mm x 8 mm   Degenerative joint disease    Diabetes mellitus without complication ([place])    diet controlled   Diverticulosis    Fatty liver 2016   pt denies   Fibromyalgia    GERD  (gastroesophageal reflux disease)    Hyperlipidemia    statin intolerant   Hypertension    Internal hemorrhoids    Obesity, Class II, BMI 35-39.9    Pneumonia 2016   PONV (postoperative nausea and vomiting)     Assessment/Plan: 1 Day Post-Op Procedure(s) (LRB): TOTAL KNEE ARTHROPLASTY (Right) Principal Problem:   OA (osteoarthritis) of knee Active Problems:   Primary osteoarthritis of right knee  Estimated body mass index is 36.76 kg/m as calculated from the following:   Height as of this encounter: 6' (1.829 m).   Weight as of this encounter: 123 kg. Advance diet Up with therapy D/C IV fluids   Samuel Chambers's anticipated LOS is less than 2 midnights, meeting these requirements: - Lives within 1 hour of care - Has a competent adult at home to recover with post-op recover - NO history of  - Coronary Artery Disease  - Heart failure  - Heart attack  - Stroke  - DVT/VTE  - Cardiac arrhythmia  - Respiratory Failure/COPD  - Renal failure  - Anemia  - Advanced Liver disease  DVT Prophylaxis - Aspirin & Plavix Weight bearing as tolerated. Continue therapy.  Plan is to go Home after hospital stay. Plan for discharge later today if cleared by PT. Scheduled for outpatient physical therapy at [facility] Follow-up in the office July 13  Theresa Duty, [place]  Orthopedic Surgery [phone] 01/25/2020, 7:57 AM

## 2020-01-25 NOTE — TOC Transition Note (Signed)
Transition of Care [facility]) - CM/SW Discharge Note   Patient Details  Name: Riot Waterworth MRN: [id number] Date of Birth: 1951/01/12  [facility] CM/SW Contact:  Lia Hopping, [place] Phone Number: 01/25/2020, 10:03 AM   Clinical Narrative:    Vanessa Barbara Discharge Plan of Care, See Note.          Patient Goals and CMS Choice        Discharge Placement                       Discharge Plan and Services                DME Arranged: N/A DME Agency: NA       HH Arranged: NA HH Agency: NA        Social Determinants of Health (SDOH) Interventions     Readmission Risk Interventions No flowsheet data found.

## 2020-01-25 NOTE — Progress Notes (Signed)
Physical Therapy Treatment Patient Details Name: Samuel Chambers MRN: [id number] DOB: [birth year] Today's Date: 01/25/2020    History of Present Illness 69 y.o. male s/p Rt TKA on 01/24/20 with PMH significant for HTN, HLD, GERD, fibromyalgia, CAD s/p angioplasty, DM, Lt TKA In 2014, Bil THA.    PT Comments    Progressing well with mobility. Reviewed/practiced exercises, gait training, and stair training. Issued HEP for pt to perform at home 1-2x daily until he begins OP PT. All education completed. Okay to d/c from PT standpoint.    Follow Up Recommendations  Follow surgeon's recommendation for DC plan and follow-up therapies     Equipment Recommendations  None recommended by PT    Recommendations for Other Services       Precautions / Restrictions Precautions Precautions: Fall Restrictions Weight Bearing Restrictions: No RLE Weight Bearing: Weight bearing as tolerated    Mobility  Bed Mobility               General bed mobility comments: oob in recliner  Transfers Overall transfer level: Needs assistance Equipment used: Rolling walker (2 wheeled) Transfers: Sit to/from Stand Sit to Stand: Supervision            Ambulation/Gait Ambulation/Gait assistance: Supervision Gait Distance (Feet): 125 Feet Assistive device: Rolling walker (2 wheeled) Gait Pattern/deviations: Step-through pattern;Decreased stride length         Stairs Stairs: Yes Stairs assistance: Min guard Stair Management: Forwards;Two rails;Step to pattern Number of Stairs: 5 General stair comments: VCs safety, technique, sequence. Min guard for safety. Wife present to observe   Wheelchair Mobility    Modified Rankin (Stroke Patients Only)       Balance Overall balance assessment: Mild deficits observed, not formally tested                                          Cognition Arousal/Alertness: Awake/alert Behavior During Therapy: WFL for tasks  assessed/performed Overall Cognitive Status: Within Functional Limits for tasks assessed                                        Exercises Total Joint Exercises Ankle Circles/Pumps: AROM;Both;10 reps Quad Sets: AROM;Both;10 reps Hip ABduction/ADduction: AROM;Right;10 reps Straight Leg Raises: AROM;Right;10 reps Knee Flexion: AROM;Right;10 reps Goniometric ROM: ~5-90 degrees    General Comments        Pertinent Vitals/Pain Pain Assessment: 0-10 Pain Score: 5  Pain Location: R knee Pain Descriptors / Indicators: Discomfort;Sore Pain Intervention(s): Monitored during session    Home Living                      Prior Function            PT Goals (current goals can now be found in the care plan section) Progress towards PT goals: Progressing toward goals    Frequency    7X/week      PT Plan Current plan remains appropriate    Co-evaluation              AM-PAC PT "6 Clicks" Mobility   Outcome Measure  Help needed turning from your back to your side while in a flat bed without using bedrails?: None Help needed moving from lying on your back to sitting on the side of  a flat bed without using bedrails?: None Help needed moving to and from a bed to a chair (including a wheelchair)?: A Little Help needed standing up from a chair using your arms (e.g., wheelchair or bedside chair)?: A Little Help needed to walk in hospital room?: A Little Help needed climbing 3-5 steps with a railing? : A Little 6 Click Score: 20    End of Session Equipment Utilized During Treatment: Gait belt Activity Tolerance: Patient tolerated treatment well Patient left: in chair;with call bell/phone within reach;with family/visitor present   PT Visit Diagnosis: Other abnormalities of gait and mobility (R26.89)     Time: 6734-1937 PT Time Calculation (min) (ACUTE ONLY): 26 min  Charges:  $Gait Training: 8-22 mins $Therapeutic Exercise: 8-22 mins                          Doreatha Massed, PT Acute Rehabilitation  Office: [phone] Pager: [phone]

## 2020-01-26 NOTE — Discharge Summary (Signed)
Physician Discharge Summary   Patient ID: Samuel Chambers MRN: [id number] DOB/AGE: 02/27/1951 68 y.o.  Admit date: 01/24/2020 Discharge date: 01/25/2020  Primary Diagnosis: Osteoarthritis, right knee   Admission Diagnoses:  Past Medical History:  Diagnosis Date  . Adenomatous colon polyp 03/2005  . Anxiety   . CAD S/P percutaneous coronary angioplasty 2008   PCI to circumflex with a Promus DES 2.5 mm x 8 mm  . Degenerative joint disease   . Diabetes mellitus without complication (HCC)    diet controlled  . Diverticulosis   . Fatty liver 2016   pt denies  . Fibromyalgia   . GERD (gastroesophageal reflux disease)   . Hyperlipidemia    statin intolerant  . Hypertension   . Internal hemorrhoids   . Obesity, Class II, BMI 35-39.9   . Pneumonia 2016  . PONV (postoperative nausea and vomiting)    Discharge Diagnoses:   Principal Problem:   OA (osteoarthritis) of knee Active Problems:   Primary osteoarthritis of right knee  Estimated body mass index is 36.76 kg/m as calculated from the following:   Height as of this encounter: 6' (1.829 m).   Weight as of this encounter: 123 kg.  Procedure:  Procedure(s) (LRB): TOTAL KNEE ARTHROPLASTY (Right)   Consults: None  HPI: Samuel Chambers is a 68 y.o. year old male with end stage OA of his right knee with progressively worsening pain and dysfunction. He has constant pain, with activity and at rest and significant functional deficits with difficulties even with ADLs. He has had extensive non-op management including analgesics, injections of cortisone and viscosupplements, and home exercise program, but remains in significant pain with significant dysfunction. Radiographs show bone on bone arthritis bone on bone medial and patellofemoral. He presents now for right Total Knee Arthroplasty.    Laboratory Data: Admission on 01/24/2020, Discharged on 01/25/2020  Component Date Value Ref Range Status  . Glucose-Capillary 01/24/2020  99  70 - 99 mg/dL Final   Glucose reference range applies only to samples taken after fasting for at least 8 hours.  . WBC 01/25/2020 14.0* 4.0 - 10.5 K/uL Final  . RBC 01/25/2020 4.13* 4.22 - 5.81 MIL/uL Final  . Hemoglobin 01/25/2020 12.1* 13.0 - 17.0 g/dL Final  . HCT 01/25/2020 35.6* 39 - 52 % Final  . MCV 01/25/2020 86.2  80.0 - 100.0 fL Final  . MCH 01/25/2020 29.3  26.0 - 34.0 pg Final  . MCHC 01/25/2020 34.0  30.0 - 36.0 g/dL Final  . RDW 01/25/2020 13.2  11.5 - 15.5 % Final  . Platelets 01/25/2020 215  150 - 400 K/uL Final  . nRBC 01/25/2020 0.0  0.0 - 0.2 % Final   Performed at  [facility], [address] [place], [place] [address]  . Sodium 01/25/2020 136  135 - 145 mmol/L Final  . Potassium 01/25/2020 4.1  3.5 - 5.1 mmol/L Final  . Chloride 01/25/2020 102  98 - 111 mmol/L Final  . CO2 01/25/2020 23  22 - 32 mmol/L Final  . Glucose, Bld 01/25/2020 187* 70 - 99 mg/dL Final   Glucose reference range applies only to samples taken after fasting for at least 8 hours.  . BUN 01/25/2020 16  8 - 23 mg/dL Final  . Creatinine, Ser 01/25/2020 1.03  0.61 - 1.24 mg/dL Final  . Calcium 01/25/2020 8.1* 8.9 - 10.3 mg/dL Final  . GFR calc non Af Amer 01/25/2020 >60  >60 mL/min Final  . GFR calc Af Amer   01/25/2020 >60  >60 mL/min Final  . Anion gap 01/25/2020 11  5 - 15 Final   Performed at [facility], [address] [place], [place] [address]  Hospital Outpatient Visit on 01/20/2020  Component Date Value Ref Range Status  . SARS Coronavirus 2 01/20/2020 NEGATIVE  NEGATIVE Final   Comment: (NOTE) SARS-CoV-2 target nucleic acids are NOT DETECTED.  The SARS-CoV-2 RNA is generally detectable in upper and lower respiratory specimens during the acute phase of infection. Negative results do not preclude SARS-CoV-2 infection, do not rule out co-infections with other pathogens, and should not be used as the sole basis for treatment or other patient  management decisions. Negative results must be combined with clinical observations, patient history, and epidemiological information. The expected result is Negative.  Fact Sheet for Patients: https://www.fda.gov/media/138098/download  Fact Sheet for Healthcare Providers: https://www.fda.gov/media/138095/download  This test is not yet approved or cleared by the [place] FDA and  has been authorized for detection and/or diagnosis of SARS-CoV-2 by FDA under an Emergency Use Authorization (EUA). This EUA will remain  in effect (meaning this test can be used) for the duration of the COVID-19 declaration under Se                          ction 564(b)(1) of the Act, 21 U.S.C. section 360bbb-3(b)(1), unless the authorization is terminated or revoked sooner.  Performed at [facility], [address] [place], [place] [address]   Hospital Outpatient Visit on 01/17/2020  Component Date Value Ref Range Status  . MRSA, PCR 01/17/2020 NEGATIVE  NEGATIVE Final  . Staphylococcus aureus 01/17/2020 NEGATIVE  NEGATIVE Final   Comment: (NOTE) The Xpert SA Assay (FDA approved for NASAL specimens in patients 22 years of age and older), is one component of a comprehensive surveillance program. It is not intended to diagnose infection nor to guide or monitor treatment. Performed at [facility], [address] [place], [place] [address]   . aPTT 01/17/2020 28  24 - 36 seconds Final   Performed at [facility], [address] [place], [place] [address]  . WBC 01/17/2020 9.4  4.0 - 10.5 K/uL Final  . RBC 01/17/2020 5.17  4.22 - 5.81 MIL/uL Final  . Hemoglobin 01/17/2020 15.1  13.0 - 17.0 g/dL Final  . HCT 01/17/2020 45.3  39 - 52 % Final  . MCV 01/17/2020 87.6  80.0 - 100.0 fL Final  . MCH 01/17/2020 29.2  26.0 - 34.0 pg Final  . MCHC 01/17/2020 33.3  30.0 - 36.0 g/dL Final  . RDW 01/17/2020 13.5  11.5 - 15.5 % Final  . Platelets 01/17/2020 212   150 - 400 K/uL Final  . nRBC 01/17/2020 0.0  0.0 - 0.2 % Final   Performed at [facility], [address] [place], [place] [address]  . Sodium 01/17/2020 137  135 - 145 mmol/L Final  . Potassium 01/17/2020 3.4* 3.5 - 5.1 mmol/L Final  . Chloride 01/17/2020 100  98 - 111 mmol/L Final  . CO2 01/17/2020 28  22 - 32 mmol/L Final  . Glucose, Bld 01/17/2020 187* 70 - 99 mg/dL Final   Glucose reference range applies only to samples taken after fasting for at least 8 hours.  . BUN 01/17/2020 17  8 - 23 mg/dL Final  . Creatinine, Ser 01/17/2020 0.98  0.61 - 1.24 mg/dL Final  . Calcium 01/17/2020 8.9  8.9 -   10.3 mg/dL Final  . Total Protein 01/17/2020 7.0  6.5 - 8.1 g/dL Final  . Albumin 01/17/2020 4.0  3.5 - 5.0 g/dL Final  . AST 01/17/2020 32  15 - 41 U/L Final  . ALT 01/17/2020 36  0 - 44 U/L Final  . Alkaline Phosphatase 01/17/2020 49  38 - 126 U/L Final  . Total Bilirubin 01/17/2020 0.6  0.3 - 1.2 mg/dL Final  . GFR calc non Af Amer 01/17/2020 >60  >60 mL/min Final  . GFR calc Af Amer 01/17/2020 >60  >60 mL/min Final  . Anion gap 01/17/2020 9  5 - 15 Final   Performed at [facility], [place] [address] [place], [place] [address]  . Prothrombin Time 01/17/2020 12.3  11.4 - 15.2 seconds Final  . INR 01/17/2020 1.0  0.8 - 1.2 Final   Comment: (NOTE) INR goal varies based on device and disease states. Performed at [facility], [place] [address] [place], [place] [address]   . ABO/RH(D) 01/17/2020 B POS   Final  . Antibody Screen 01/17/2020 NEG   Final  . Sample Expiration 01/17/2020 01/27/2020,2359   Final  . Extend sample reason 01/17/2020    Final                   Value:NO TRANSFUSIONS OR PREGNANCY IN THE PAST 3 MONTHS Performed at [facility] [address], [place], [place] [address]   Office Visit on 12/23/2019  Component Date Value Ref Range Status  . WBC 12/23/2019 11.7* 3.4 - 10.8 x10E3/uL Final  . RBC  12/23/2019 5.12  4.14 - 5.80 x10E6/uL Final  . Hemoglobin 12/23/2019 15.0  13.0 - 17.7 g/dL Final  . Hematocrit 12/23/2019 45.1  37.5 - 51.0 % Final  . MCV 12/23/2019 88  79 - 97 fL Final  . MCH 12/23/2019 29.3  26.6 - 33.0 pg Final  . MCHC 12/23/2019 33.3  31 - 35 g/dL Final  . RDW 12/23/2019 14.0  11.6 - 15.4 % Final  . Platelets 12/23/2019 244  150 - 450 x10E3/uL Final  . Neutrophils 12/23/2019 69  Not Estab. % Final  . Lymphs 12/23/2019 22  Not Estab. % Final  . Monocytes 12/23/2019 6  Not Estab. % Final  . Eos 12/23/2019 1  Not Estab. % Final  . Basos 12/23/2019 1  Not Estab. % Final  . Neutrophils Absolute 12/23/2019 8.2* 1 - 7 x10E3/uL Final  . Lymphocytes Absolute 12/23/2019 2.5  0 - 3 x10E3/uL Final  . Monocytes Absolute 12/23/2019 0.7  0 - 0 x10E3/uL Final  . EOS (ABSOLUTE) 12/23/2019 0.1  0.0 - 0.4 x10E3/uL Final  . Basophils Absolute 12/23/2019 0.1  0 - 0 x10E3/uL Final  . Immature Granulocytes 12/23/2019 1  Not Estab. % Final  . Immature Grans (Abs) 12/23/2019 0.1  0.0 - 0.1 x10E3/uL Final  . Glucose 12/23/2019 94  65 - 99 mg/dL Final  . BUN 12/23/2019 16  8 - 27 mg/dL Final  . Creatinine, Ser 12/23/2019 0.95  0.76 - 1.27 mg/dL Final  . GFR calc non Af Amer 12/23/2019 82  >59 mL/min/1.73 Final  . GFR calc Af Amer 12/23/2019 95  >59 mL/min/1.73 Final   Comment: **[facility] currently reports eGFR in compliance with the current**   recommendations of the [employer]. [facility] will   update reporting as new guidelines are published from the NKF-ASN   Task force.   . BUN/Creatinine Ratio 12/23/2019 17  10 -  24 Final  . Sodium 12/23/2019 142  134 - 144 mmol/L Final  . Potassium 12/23/2019 3.9  3.5 - 5.2 mmol/L Final  . Chloride 12/23/2019 101  96 - 106 mmol/L Final  . CO2 12/23/2019 24  20 - 29 mmol/L Final  . Calcium 12/23/2019 9.6  8.6 - 10.2 mg/dL Final  . Total Protein 12/23/2019 6.9  6.0 - 8.5 g/dL Final  . Albumin 12/23/2019 4.4  3.8 - 4.8 g/dL Final  .  Globulin, Total 12/23/2019 2.5  1.5 - 4.5 g/dL Final  . Albumin/Globulin Ratio 12/23/2019 1.8  1.2 - 2.2 Final  . Bilirubin Total 12/23/2019 0.4  0.0 - 1.2 mg/dL Final  . Alkaline Phosphatase 12/23/2019 65  48 - 121 IU/L Final                 **Please note reference interval change**  . AST 12/23/2019 28  0 - 40 IU/L Final  . ALT 12/23/2019 29  0 - 44 IU/L Final  . Hgb A1c MFr Bld 12/23/2019 6.1* 4.8 - 5.6 % Final   Comment:          Prediabetes: 5.7 - 6.4          Diabetes: >6.4          Glycemic control for adults with diabetes: <7.0   . Est. average glucose Bld gHb Est-m* 12/23/2019 128  mg/dL Final  . D[employer] SARS-CoV-2 Ab, IgG 12/23/2019 Positive  Negative Final   Comment: Results suggest recent or prior infection with SARS-CoV-2. Correlation with epidemiologic risk factors and other clinical and laboratory findings is recommended. Serologic results should not be used as the sole basis to diagnose or exclude recent SARS-CoV-2 infection. False positive results infrequently occur due to prior infection with other human Coronaviruses. This assay was performed using the [employer] Liaison(R) SARS-CoV-2 S1/S2 IgG assay. This assay detects antibodies against SARS-CoV-2 spike protein including the receptor binding domain (RBD).      X-Rays:No results found.  EKG: Orders placed or performed in visit on 12/30/19  . EKG 12-Lead     Hospital Course: Samuel Chambers is a 69 y.o. who was admitted to [facility]. They were brought to the operating room on 01/24/2020 and underwent Procedure(s): TOTAL KNEE ARTHROPLASTY.  Patient tolerated the procedure well and was later transferred to the recovery room and then to the orthopaedic floor for postoperative care. They were given PO and IV analgesics for pain control following their surgery. They were given 24 hours of postoperative antibiotics of  Anti-infectives (From admission, onward)   Start     Dose/Rate Route Frequency Ordered  Stop   01/24/20 1800  ceFAZolin (ANCEF) IVPB 2g/100 mL premix        2 g 200 mL/hr over 30 Minutes Intravenous Every 6 hours 01/24/20 1452 01/25/20 0026   01/24/20 0600  ceFAZolin (ANCEF) 3 g in dextrose 5 % 50 mL IVPB        3 g 100 mL/hr over 30 Minutes Intravenous On call to O.R. 01/23/20 8250 01/24/20 1223     and started on DVT prophylaxis in the form of Aspirin and Plavix.   PT and OT were ordered for total joint protocol. Discharge planning consulted to help with postop disposition and equipment needs.  Patient had a good night on the evening of surgery. They started to get up OOB with therapy on POD #0. Pt was seen during rounds and was ready to go home pending progress with therapy. He  worked with therapy on POD #1 and was meeting his goals. Pt was discharged to home later that day in stable condition.  The PDMP database was reviewed today prior to any opioid medications being prescribed to this patient.  Diet: Diabetic diet Activity: WBAT Follow-up: in 2 weeks Disposition: Home with outpatient physical therapy at [facility] Discharged Condition: stable   Discharge Instructions    Call MD / Call 911   Complete by: As directed    If you experience chest pain or shortness of breath, CALL 911 and be transported to the hospital emergency room.  If you develope a fever above 101 F, pus (white drainage) or increased drainage or redness at the wound, or calf pain, call your surgeon's office.   Change dressing   Complete by: As directed    You may remove the bulky bandage (ACE wrap and gauze) two days after surgery. You will have an adhesive waterproof bandage underneath. Leave this in place until your first follow-up appointment.   Constipation Prevention   Complete by: As directed    Drink plenty of fluids.  Prune juice may be helpful.  You may use a stool softener, such as Colace (over the counter) 100 mg twice a day.  Use MiraLax (over the counter) for constipation as needed.    Diet - low sodium heart healthy   Complete by: As directed    Do not put a pillow under the knee. Place it under the heel.   Complete by: As directed    Driving restrictions   Complete by: As directed    No driving for two weeks   TED hose   Complete by: As directed    Use stockings (TED hose) for three weeks on both leg(s).  You may remove them at night for sleeping.   Weight bearing as tolerated   Complete by: As directed      Allergies as of 01/25/2020      Reactions   Gabapentin    Pt reports increased heart rate and blood pressure   Crestor [rosuvastatin] Other (See Comments)   myalgias   Valsartan Other (See Comments)   Made patient feel tired and no energy   Warfarin And Related Other (See Comments)   Fast heart beat, went down hill, felt like he was dying      Medication List    TAKE these medications   aspirin 325 MG EC tablet Take 1 tablet (325 mg total) by mouth daily with breakfast for 20 days. Then take one 81 mg aspirin once a day for three weeks. Then discontinue aspirin.   clopidogrel 75 MG tablet Commonly known as: PLAVIX TAKE 1 TABLET(75 MG) BY MOUTH DAILY What changed:   how much to take  how to take this  when to take this  additional instructions   DULoxetine 60 MG capsule Commonly known as: CYMBALTA Take 1 capsule (60 mg total) by mouth daily.   hydrochlorothiazide 25 MG tablet Commonly known as: HYDRODIURIL TAKE 1 TABLET(25 MG) BY MOUTH DAILY What changed:   how much to take  how to take this  when to take this  additional instructions   HYDROmorphone 2 MG tablet Commonly known as: DILAUDID Take 1-2 tablets (2-4 mg total) by mouth every 4 (four) hours as needed for severe pain.   losartan 100 MG tablet Commonly known as: COZAAR Take 1 tablet (100 mg total) by mouth daily.   methocarbamol 500 MG tablet Commonly known as: ROBAXIN Take 1 tablet (  500 mg total) by mouth every 6 (six) hours as needed for muscle spasms.     metoprolol succinate 50 MG 24 hr tablet Commonly known as: TOPROL-XL TAKE 1 TABLET BY MOUTH DAILY. TAKE WITH OR IMMEDIATELY FOLLOWING A MEAL What changed:   how much to take  how to take this  when to take this  additional instructions            Discharge Care Instructions  (From admission, onward)         Start     Ordered   01/25/20 0000  Weight bearing as tolerated        01/25/20 0801   01/25/20 0000  Change dressing       Comments: You may remove the bulky bandage (ACE wrap and gauze) two days after surgery. You will have an adhesive waterproof bandage underneath. Leave this in place until your first follow-up appointment.   01/25/20 0801          Follow-up Information    Aluisio, Frank, MD. Go on 02/08/2020.   Specialty: Orthopedic Surgery Why: You are scheduled for a post-operative appointment on 02-08-20 at 4:30 pm.  Contact information: [address] [place] [address] [phone]        [facility] Go on 01/27/2020.   Why: You are scheduled for a physical therapy appointment on 01-27-20 at 4:00 pm.  Contact information: [address] [place] [address] [phone]               Signed: Kristie Edmisten, PA-C Orthopedic Surgery 01/26/2020, 10:08 AM    

## 2020-02-09 ENCOUNTER — Other Ambulatory Visit (HOSPITAL_BASED_OUTPATIENT_CLINIC_OR_DEPARTMENT_OTHER): Payer: Self-pay | Admitting: Physician Assistant

## 2020-02-09 ENCOUNTER — Emergency Department (HOSPITAL_BASED_OUTPATIENT_CLINIC_OR_DEPARTMENT_OTHER)
Admission: EM | Admit: 2020-02-09 | Discharge: 2020-02-09 | Disposition: A | Discharge status: Home / Self Care | Payer: Medicare Other

## 2020-02-09 ENCOUNTER — Other Ambulatory Visit: Payer: Self-pay

## 2020-02-09 ENCOUNTER — Ambulatory Visit (HOSPITAL_BASED_OUTPATIENT_CLINIC_OR_DEPARTMENT_OTHER)
Admission: RE | Admit: 2020-02-09 | Discharge: 2020-02-09 | Disposition: A | Discharge status: Home / Self Care | Payer: Medicare Other | Source: Ambulatory Visit | Attending: Physician Assistant | Admitting: Physician Assistant

## 2020-02-09 DIAGNOSIS — M79605 Pain in left leg: Secondary | ICD-10-CM

## 2020-02-09 DIAGNOSIS — Z0389 Encounter for observation for other suspected diseases and conditions ruled out: Secondary | ICD-10-CM | POA: Diagnosis not present

## 2020-02-10 ENCOUNTER — Other Ambulatory Visit (HOSPITAL_BASED_OUTPATIENT_CLINIC_OR_DEPARTMENT_OTHER): Payer: Medicare Other

## 2020-02-11 DIAGNOSIS — M25661 Stiffness of right knee, not elsewhere classified: Secondary | ICD-10-CM | POA: Diagnosis not present

## 2020-02-11 DIAGNOSIS — M25561 Pain in right knee: Secondary | ICD-10-CM | POA: Diagnosis not present

## 2020-02-16 DIAGNOSIS — M79674 Pain in right toe(s): Secondary | ICD-10-CM | POA: Diagnosis not present

## 2020-02-18 DIAGNOSIS — M25661 Stiffness of right knee, not elsewhere classified: Secondary | ICD-10-CM | POA: Diagnosis not present

## 2020-02-18 DIAGNOSIS — M25561 Pain in right knee: Secondary | ICD-10-CM | POA: Diagnosis not present

## 2020-02-21 DIAGNOSIS — M25661 Stiffness of right knee, not elsewhere classified: Secondary | ICD-10-CM | POA: Diagnosis not present

## 2020-02-21 DIAGNOSIS — M25561 Pain in right knee: Secondary | ICD-10-CM | POA: Diagnosis not present

## 2020-02-29 DIAGNOSIS — M25551 Pain in right hip: Secondary | ICD-10-CM | POA: Diagnosis not present

## 2020-02-29 DIAGNOSIS — M7061 Trochanteric bursitis, right hip: Secondary | ICD-10-CM | POA: Diagnosis not present

## 2020-02-29 DIAGNOSIS — Z96651 Presence of right artificial knee joint: Secondary | ICD-10-CM | POA: Diagnosis not present

## 2020-03-02 ENCOUNTER — Other Ambulatory Visit: Payer: Self-pay | Admitting: Cardiovascular Disease

## 2020-03-14 ENCOUNTER — Other Ambulatory Visit: Payer: Self-pay | Admitting: Cardiovascular Disease

## 2020-03-15 NOTE — Telephone Encounter (Signed)
Rx has been sent to the pharmacy electronically. ° °

## 2020-04-20 ENCOUNTER — Other Ambulatory Visit: Payer: Self-pay

## 2020-04-20 MED ORDER — METOPROLOL SUCCINATE ER 50 MG PO TB24: ORAL_TABLET | ORAL | 2 refills | Status: DC

## 2020-04-21 ENCOUNTER — Other Ambulatory Visit: Payer: Self-pay | Admitting: Emergency Medicine

## 2020-04-21 DIAGNOSIS — F411 Generalized anxiety disorder: Secondary | ICD-10-CM

## 2020-05-11 DIAGNOSIS — Z96651 Presence of right artificial knee joint: Secondary | ICD-10-CM | POA: Diagnosis not present

## 2020-05-22 ENCOUNTER — Encounter: Payer: Self-pay | Admitting: Orthopaedic Surgery

## 2020-05-22 ENCOUNTER — Ambulatory Visit (INDEPENDENT_AMBULATORY_CARE_PROVIDER_SITE_OTHER): Payer: Medicare Other

## 2020-05-22 ENCOUNTER — Other Ambulatory Visit: Payer: Self-pay

## 2020-05-22 ENCOUNTER — Ambulatory Visit (INDEPENDENT_AMBULATORY_CARE_PROVIDER_SITE_OTHER): Payer: Medicare Other | Admitting: Orthopaedic Surgery

## 2020-05-22 VITALS — Ht 72.0 in | Wt 270.0 lb

## 2020-05-22 DIAGNOSIS — M25551 Pain in right hip: Secondary | ICD-10-CM

## 2020-05-22 DIAGNOSIS — Z9861 Coronary angioplasty status: Secondary | ICD-10-CM | POA: Diagnosis not present

## 2020-05-22 DIAGNOSIS — G8929 Other chronic pain: Secondary | ICD-10-CM | POA: Diagnosis not present

## 2020-05-22 DIAGNOSIS — Z96651 Presence of right artificial knee joint: Secondary | ICD-10-CM | POA: Diagnosis not present

## 2020-05-22 DIAGNOSIS — Z96641 Presence of right artificial hip joint: Secondary | ICD-10-CM

## 2020-05-22 DIAGNOSIS — M25559 Pain in unspecified hip: Secondary | ICD-10-CM

## 2020-05-22 DIAGNOSIS — M25561 Pain in right knee: Secondary | ICD-10-CM

## 2020-05-22 DIAGNOSIS — I2510 Atherosclerotic heart disease of native coronary artery without angina pectoris: Secondary | ICD-10-CM

## 2020-05-22 DIAGNOSIS — M7631 Iliotibial band syndrome, right leg: Secondary | ICD-10-CM

## 2020-05-22 MED ORDER — METHYLPREDNISOLONE 4 MG PO TABS
ORAL_TABLET | ORAL | 0 refills | Status: DC
Start: 2020-05-22 — End: 2020-06-13

## 2020-05-22 MED ORDER — HYDROCODONE-ACETAMINOPHEN 5-325 MG PO TABS: 1.0000 | ORAL_TABLET | Freq: Four times a day (QID) | ORAL | 0 refills | Status: DC | PRN

## 2020-05-22 MED ORDER — TIZANIDINE HCL 4 MG PO TABS
4.0000 mg | ORAL_TABLET | Freq: Three times a day (TID) | ORAL | 1 refills | Status: DC | PRN
Start: 2020-05-22 — End: 2020-06-13

## 2020-05-22 NOTE — Progress Notes (Signed)
Office Visit Note   Patient: Samuel Chambers           Date of Birth: [birth year]           MRN: [id number] Visit Date: 05/22/2020              Requested by: Horald Pollen, MD [place],  [place] [address] PCP: Horald Pollen, MD   Assessment & Plan: Visit Diagnoses:  1. Hip pain   2. Chronic pain of right knee   3. History of total right hip replacement   4. History of total right knee replacement   5. It band syndrome, right     Plan: I would like to put him on a combination of a 6-day steroid taper along with some hydrocodone and Zanaflex.  I would also like to send him to our outpatient physical therapy here at [place] to see if they can provide any modalities that helps calm down his IT band pain on the right side and his right hip trochanteric pain.  They can also work on functioning of the knee in general with any modalities that can help decrease his pain.  He agrees with this treatment plan.  It may be worth starting Neurontin again at some point but we will see how he does over the next 4 weeks as we try this treatment plan.  All question concerns were answered addressed.  He agrees with this.  Of note the primary treating team has ordered a MRI of his lumbar spine which I think is reasonable.  Follow-Up Instructions: Return in about 4 weeks (around 06/19/2020).   Orders:  Orders Placed This Encounter  Procedures  . XR HIP UNILAT W OR W/O PELVIS 1V RIGHT  . XR Knee 1-2 Views Right   Meds ordered this encounter  Medications  . methylPREDNISolone (MEDROL) 4 MG tablet    Sig: Medrol dose pack. Take as instructed    Dispense:  21 tablet    Refill:  0  . HYDROcodone-acetaminophen (NORCO/VICODIN) 5-325 MG tablet    Sig: Take 1 tablet by mouth every 6 (six) hours as needed for moderate pain.    Dispense:  30 tablet    Refill:  0  . tiZANidine (ZANAFLEX) 4 MG tablet    Sig: Take 1 tablet (4 mg total) by mouth every 8 (eight) hours as  needed for muscle spasms.    Dispense:  30 tablet    Refill:  1      Procedures: No procedures performed   Clinical Data: No additional findings.   Subjective: Chief Complaint  Patient presents with  . Right Knee - Pain  The patient is a very pleasant 69 year old gentleman who comes for second opinion as it relates to severe right hip and thigh pain as well as right knee pain.  He has a history of bilateral hip replacements and bilateral knee replacements.  His right hip was done in 2019 through an anterior approach.  His left hip was done remotely through posterior approach.  His left knee was done quite some time ago and his right knee was replaced in just June of this year.  He has been under 4 months since that right knee was replaced.  He said has been able to get his flexion back but has not been able get his full extension back.  He says his left knee moves quite smoothly.  He said after hip replacement surgery he had significant trochanteric  pain and IT band pain but it was felt that this was related to the bone-on-bone arthritis he had from his right knee that was still in his gait off.  I told him this is absolutely reasonable and that was likely what was going on.  He is still having quite debilitating pain.  He says the primary service is recommended a knee brace/splint that is likely a JAS splint.  He had only had four physical therapy sessions after his knee replacement and he was released and he felt like he needed more due to the severity of his IT band pain and his hip pain.  He cannot take anti-inflammatories on a regular basis due to being on Plavix.  He is not a diabetic.  He is having significant pain syndrome as well as it relates to his postoperative recovery.  He is not on any pain medications.  He has tried gabapentin and that has not helped.  HPI  Review of Systems He currently denies any headache, chest pain, shortness of breath, fever, chills, nausea,  vomiting  Objective: Vital Signs: Ht 6' (1.829 m)   Wt 270 lb (122.5 kg)   BMI 36.62 kg/m   Physical Exam He is alert and oriented x3 and in no acute distress but obvious discomfort Ortho Exam Examination of his right hip shows that it does move smoothly and fluidly.  He has pain of the trochanteric area and a very tight IT band with IT band pain.  Examination of his right knee shows just a mild effusion.  There is no evidence of infection at all.  He lacks full extension by about three or more degrees.  He can flex to past 110 degrees.  The knee feels ligamentously stable on the right side.  His left knee has full flexion and extension. Specialty Comments:  No specialty comments available.  Imaging: XR HIP UNILAT W OR W/O PELVIS 1V RIGHT  Result Date: 05/22/2020 An AP pelvis and lateral of the right hip shows a well-seated total hip arthroplasty with no complicating features.  XR Knee 1-2 Views Right  Result Date: 05/22/2020 Two views of the right knee show well-seated total knee arthroplasty with no complicating features.    PMFS History: Patient Active Problem List   Diagnosis Date Noted  . Primary osteoarthritis of right knee 01/24/2020  . OA (osteoarthritis) of hip 03/16/2018  . Elevated hemoglobin A1c 03/14/2018  . Severe obesity (BMI 35.0-35.9 with comorbidity) ([place]) 08/04/2014  . Hyperlipidemia with target LDL less than 70; statin intolerant 04/29/2013  . CAD S/P percutaneous coronary angioplasty   . S/P laparoscopic cholecystectomy July 2014 02/18/2013  . OA (osteoarthritis) of knee 09/04/2012  . Essential hypertension 02/24/2007  . GERD 02/24/2007  . DEGENERATIVE JOINT DISEASE, GENERALIZED 02/24/2007   Past Medical History:  Diagnosis Date  . Adenomatous colon polyp 03/2005  . Anxiety   . CAD S/P percutaneous coronary angioplasty 2008   PCI to circumflex with a Promus DES 2.5 mm x 8 mm  . Degenerative joint disease   . Diabetes mellitus without complication  (HCC)    diet controlled  . Diverticulosis   . Fatty liver 2016   pt denies  . Fibromyalgia   . GERD (gastroesophageal reflux disease)   . Hyperlipidemia    statin intolerant  . Hypertension   . Internal hemorrhoids   . Obesity, Class II, BMI 35-39.9   . Pneumonia 2016  . PONV (postoperative nausea and vomiting)     Family History  Problem Relation Age of Onset  . Breast cancer Mother   . Diabetes Mother   . Prostate cancer Father   . Colon cancer Neg Hx     Past Surgical History:  Procedure Laterality Date  . CHOLECYSTECTOMY N/A 02/18/2013   Procedure: LAPAROSCOPIC CHOLECYSTECTOMY WITH INTRAOPERATIVE CHOLANGIOGRAM;  Surgeon: Pedro Earls, MD;  Location: [facility];  Service: General;  Laterality: N/A;  . COLONOSCOPY    . CORONARY ANGIOPLASTY WITH STENT PLACEMENT  04/30/2007   2.5 mm Promus stent that was to 95% Circ;had 60-70% lesion to RCA  . DOPPLER ECHOCARDIOGRAPHY  05/27/2002   [facility]normal EF 55-66%,  . FOOT SURGERY Right    x2 fascia  . HEEL SPUR SURGERY Right   . HEMORRHOID SURGERY    . Holter Monitor  04/07/2007   sinus tachy.;  . KNEE ARTHROSCOPY  03/27/2012   Procedure: ARTHROSCOPY KNEE;  Surgeon: Gearlean Alf, MD;  Location: [facility];  Service: Orthopedics;  Laterality: Left;  . NM MYOCAR PERF WALL MOTION  12/19/2011   EXERCISED FOR 8-1/2 MINUTES RECHING 10 METABOLIC EQUIVALENTS. NO EVIDENCE  OF ISCHEMIA OR INFARCTION . EF 71%  . renal dopplers  04/08/2007   relatively normal  . TOTAL HIP ARTHROPLASTY Right 03/16/2018   Procedure: RIGHT TOTAL HIP ARTHROPLASTY ANTERIOR APPROACH;  Surgeon: Gaynelle Arabian, MD;  Location: [facility];  Service: Orthopedics;  Laterality: Right;  . TOTAL HIP ARTHROPLASTY Left about 10 to 12 years ago  . TOTAL KNEE ARTHROPLASTY Left 09/04/2012   Procedure: TOTAL KNEE ARTHROPLASTY;  Surgeon: Gearlean Alf, MD;  Location: [facility];  Service: Orthopedics;  Laterality: Left;  . TOTAL KNEE ARTHROPLASTY Right 01/24/2020   Procedure: TOTAL  KNEE ARTHROPLASTY;  Surgeon: Gaynelle Arabian, MD;  Location: [facility];  Service: Orthopedics;  Laterality: Right;  12min   Social History   Occupational History    Employer: [employer]  Tobacco Use  . Smoking status: Never Smoker  . Smokeless tobacco: Never Used  Vaping Use  . Vaping Use: Never used  Substance and Sexual Activity  . Alcohol use: No  . Drug use: No  . Sexual activity: Not on file

## 2020-05-30 DIAGNOSIS — M545 Low back pain, unspecified: Secondary | ICD-10-CM | POA: Diagnosis not present

## 2020-06-05 ENCOUNTER — Ambulatory Visit (INDEPENDENT_AMBULATORY_CARE_PROVIDER_SITE_OTHER): Payer: Medicare Other | Admitting: Rehabilitative and Restorative Service Providers"

## 2020-06-05 ENCOUNTER — Other Ambulatory Visit: Payer: Self-pay

## 2020-06-05 ENCOUNTER — Encounter: Payer: Self-pay | Admitting: Rehabilitative and Restorative Service Providers"

## 2020-06-05 DIAGNOSIS — M25661 Stiffness of right knee, not elsewhere classified: Secondary | ICD-10-CM | POA: Diagnosis not present

## 2020-06-05 DIAGNOSIS — M79604 Pain in right leg: Secondary | ICD-10-CM | POA: Diagnosis not present

## 2020-06-05 DIAGNOSIS — M6281 Muscle weakness (generalized): Secondary | ICD-10-CM | POA: Diagnosis not present

## 2020-06-05 DIAGNOSIS — R262 Difficulty in walking, not elsewhere classified: Secondary | ICD-10-CM | POA: Diagnosis not present

## 2020-06-05 NOTE — Patient Instructions (Signed)
Access Code: [email or web address] URL: [email or web address] Date: 06/05/2020 Prepared by: Scot Jun  Exercises Supine Bridge - 2 x daily - 7 x weekly - 3 sets - 10 reps - 2 hold Prone Quadriceps Stretch with Strap - 2 x daily - 7 x weekly - 3 sets - 5 reps - 30 hold Clamshell - 2 x daily - 7 x weekly - 3 sets - 10 reps

## 2020-06-05 NOTE — Therapy (Signed)
[facility] [address] [place], [place], [address] Phone: [phone]   Fax:  [phone]  Physical Therapy Evaluation  Patient Details  Name: Samuel Chambers MRN: [id number] Date of Birth: [birth year] Referring Provider (PT): Dr. Ninfa Linden   Encounter Date: 06/05/2020   PT End of Session - 06/05/20 0801    Visit Number 1    Number of Visits 12    Date for PT Re-Evaluation 08/14/20    Authorization Type Medicare    Progress Note Due on Visit 10    PT Start Time 0805    PT Stop Time 9924    PT Time Calculation (min) 39 min    Activity Tolerance Patient tolerated treatment well    Behavior During Therapy [facility] for tasks assessed/performed           Past Medical History:  Diagnosis Date  . Adenomatous colon polyp 03/2005  . Anxiety   . CAD S/P percutaneous coronary angioplasty 2008   PCI to circumflex with a Promus DES 2.5 mm x 8 mm  . Degenerative joint disease   . Diabetes mellitus without complication (HCC)    diet controlled  . Diverticulosis   . Fatty liver 2016   pt denies  . Fibromyalgia   . GERD (gastroesophageal reflux disease)   . Hyperlipidemia    statin intolerant  . Hypertension   . Internal hemorrhoids   . Obesity, Class II, BMI 35-39.9   . Pneumonia 2016  . PONV (postoperative nausea and vomiting)     Past Surgical History:  Procedure Laterality Date  . CHOLECYSTECTOMY N/A 02/18/2013   Procedure: LAPAROSCOPIC CHOLECYSTECTOMY WITH INTRAOPERATIVE CHOLANGIOGRAM;  Surgeon: Pedro Earls, MD;  Location: [facility];  Service: General;  Laterality: N/A;  . COLONOSCOPY    . CORONARY ANGIOPLASTY WITH STENT PLACEMENT  04/30/2007   2.5 mm Promus stent that was to 95% Circ;had 60-70% lesion to RCA  . DOPPLER ECHOCARDIOGRAPHY  05/27/2002   [facility]normal EF 55-66%,  . FOOT SURGERY Right    x2 fascia  . HEEL SPUR SURGERY Right   . HEMORRHOID SURGERY    . Holter Monitor  04/07/2007   sinus tachy.;  . KNEE ARTHROSCOPY   03/27/2012   Procedure: ARTHROSCOPY KNEE;  Surgeon: Gearlean Alf, MD;  Location[facility];  Service: Orthopedics;  Laterality: Left;  . NM MYOCAR PERF WALL MOTION  12/19/2011   EXERCISED FOR 8-1/2 MINUTES RECHING 10 METABOLIC EQUIVALENTS. NO EVIDENCE  OF ISCHEMIA OR INFARCTION . EF 71%  . renal dopplers  04/08/2007   relatively normal  . TOTAL HIP ARTHROPLASTY Right 03/16/2018   Procedure: RIGHT TOTAL HIP ARTHROPLASTY ANTERIOR APPROACH;  Surgeon: Gaynelle Arabian, MD;  Location[facility];  Service: Orthopedics;  Laterality: Right;  . TOTAL HIP ARTHROPLASTY Left about 10 to 12 years ago  . TOTAL KNEE ARTHROPLASTY Left 09/04/2012   Procedure: TOTAL KNEE ARTHROPLASTY;  Surgeon: Gearlean Alf, MD;  Location: [facility];  Service: Orthopedics;  Laterality: Left;  . TOTAL KNEE ARTHROPLASTY Right 01/24/2020   Procedure: TOTAL KNEE ARTHROPLASTY;  Surgeon: Gaynelle Arabian, MD;  Location: [facility];  Service: Orthopedics;  Laterality: Right;  58min    There were no vitals filed for this visit.    Subjective Assessment - 06/05/20 0807    Subjective Pt. indicated complaints of Rt hip/thigh to knee pain starting up about 2 years ago after Rt THA.  Pt. stated continued complaints that led to injections in Rt knee and ultimately Rt TKA.  Pt. stated increased trouble since knee replacement in June. Pt. was given steriod taper recently and also reported hip injection prior to knee replacement helped as well.  Possible injection in back depending.    Pertinent History History of bilateral hip and knee replacements.  Rt hip approx. 2 years ago, Rt knee this year.    Limitations Standing;Walking    Diagnostic tests MRI on lumbar    Patient Stated Goals Reduce pain    Currently in Pain? Yes    Pain Location Leg   Rt hip/thigh to knee, lateral/anterioor   Pain Orientation Right    Pain Descriptors / Indicators Sore;Aching;Sharp    Pain Type Chronic pain    Pain Onset More than a month ago    Pain Frequency Intermittent      Aggravating Factors  insidious at rest at times, walking/standing prolonged, uneven surface walking, prolonged sitting.  Wakes up at night due to symptoms, able to get back to sleep.    Pain Relieving Factors Rest    Effect of Pain on Daily Activities Limited in recreational fishing, community walking, house/yard work, competition shooting.              [facility] PT Assessment - 06/05/20 0001      Assessment   Medical Diagnosis Rt hip/knee pain    Referring Provider (PT) Dr. Ninfa Linden    Onset Date/Surgical Date 12/28/19    Hand Dominance Right      Precautions   Precautions None      Restrictions   Weight Bearing Restrictions No      Balance Screen   Has the patient fallen in the past 6 months Yes    How many times? 1   stepping in boat   Has the patient had a decrease in activity level because of a fear of falling?  Yes   due to pain     [place] residence    Additional Comments front porch steps, 4-5.       Prior Function   Level of Independence Independent    Vocation Retired    Engineer, manufacturing systems, shooting, house/yard work      Charity fundraiser Status Within [employer] for tasks assessed      Observation/Other Assessments   Focus on Therapeutic Outcomes (FOTO)  Intake status 31%, expected outcome: 54%      Functional Tests   Functional tests Single leg stance;Sit to Stand      Single Leg Stance   Comments L SLS 8 seconds, Rt < 3 seconds c trendelenburg noted      ROM / Strength   AROM / PROM / Strength PROM;Strength;AROM      AROM   Overall AROM Comments Lumbar flexion/extension no pain in back    AROM Assessment Site Knee;Hip;Ankle    Right/Left Hip Left;Right    Right/Left Knee Left;Right    Right Knee Extension -10    Right Knee Flexion 112    Left Knee Extension 0    Left Knee Flexion 121    Right/Left Ankle Left;Right    Right Ankle Dorsiflexion 0   5   Left Ankle Dorsiflexion 9      PROM    PROM Assessment Site Hip;Knee      Strength   Strength Assessment Site Hip;Knee;Ankle    Right/Left Hip Left;Right    Right Hip Flexion 5/5    Right Hip ABduction 4/5    Left Hip Flexion  5/5    Right/Left Knee Left;Right    Right Knee Flexion 5/5    Right Knee Extension 5/5    Left Knee Flexion 5/5    Left Knee Extension 5/5    Right/Left Ankle Left;Right    Right Ankle Dorsiflexion 5/5    Left Ankle Dorsiflexion 5/5      Palpation   Palpation comment Tenderness and tightness in Rt lateral thigh, IT band/lateral quad, glute med/min TrP      Special Tests   Other special tests + ely's on Rt      Transfers   Comments Able to perform sit to stand from 18 inch chair on 1st attempt but c pain and deviation to Lt LE      Ambulation/Gait   Gait Comments Independent ambulation into clinic                      Objective measurements completed on examination: See above findings.       [facility] PT Treatment/Exercise - 06/05/20 0001      Exercises   Exercises Other Exercises;Knee/Hip    Other Exercises  Education and HEP cues given for proper techniques/positioning.  Demonstration of 1 set of each on handout provided      Knee/Hip Exercises: Stretches   Other Knee/Hip Stretches prone Rt quad stretch c strap 30 sec x 3      Knee/Hip Exercises: Supine   Other Supine Knee/Hip Exercises supine bridge x 10      Knee/Hip Exercises: Sidelying   Clams Rt LE x 15                  PT Education - 06/05/20 0800    Education Details HEP, DN, POC    Person(s) Educated Patient    Methods Explanation;Verbal cues;Handout    Comprehension Verbalized understanding;Returned demonstration            PT Short Term Goals - 06/05/20 0856      PT SHORT TERM GOAL #1   Title Patient will demonstrate independent use of home exercise program to maintain progress from in clinic treatments.    Time 3    Period Weeks    Status New    Target Date 06/26/20              PT Long Term Goals - 06/05/20 0903      PT LONG TERM GOAL #1   Title Patient will demonstrate/report pain at worst less than or equal to 2/10 to facilitate minimal limitation in daily activity secondary to pain symptoms.    Time 10    Period Weeks    Status New    Target Date 08/14/20      PT LONG TERM GOAL #2   Title Patient will demonstrate independent use of home exercise program to facilitate ability to maintain/progress functional gains from skilled physical therapy services.    Time 10    Period Weeks    Status New    Target Date 08/14/20      PT LONG TERM GOAL #3   Title Pt. will demonstrate Rt knee AROM 0-120 deg to facilitate transfers, stairs, sitting at PLOF s limitation    Time 10    Period Weeks    Status New    Target Date 08/14/20      PT LONG TERM GOAL #4   Title Pt. will demonstrate Rt LE MMT 5/5 throughout to facilitate usual standing, walking, stairs, squat at  PLOF s limitation.    Time 10    Period Weeks    Status New    Target Date 08/14/20      PT LONG TERM GOAL #5   Title Pt. will demonstrate bilateral SLS > 15 seconds to facilitate stablity on uneven surface ambulation for recreational activity return.    Time 10    Period Weeks    Status New    Target Date 08/14/20                  Plan - 06/05/20 0801    Clinical Impression Statement Patient is a 69 y.o. male who comes to clinic with complaints of Rt leg pain with mobility, strength and movement coordination deficits that impair their ability to perform usual daily and recreational functional activities without increase difficulty/symptoms at this time.  Patient to benefit from skilled PT services to address impairments and limitations to improve to previous level of function without restriction secondary to condition.    Personal Factors and Comorbidities Comorbidity 3+    Comorbidities DM, fibromyalgia, DJD, HTN, hyperlipidemia    Examination-Activity Limitations  Sit;Sleep;Squat;Stairs;Stand;Bend;Lift;Locomotion Level    Examination-Participation Restrictions Community Activity;Other;Yard Work   fishing, Administrator, sports Evolving/Moderate complexity    Clinical Decision Making Moderate    Rehab Potential Good    PT Frequency 2x / week    PT Duration Other (comment)   10 weeks   PT Treatment/Interventions ADLs/Self Care Home Management;Electrical Stimulation;Cryotherapy;Iontophoresis 4mg /ml Dexamethasone;Moist Heat;Balance training;Therapeutic exercise;Therapeutic activities;Functional mobility training;Stair training;Ultrasound;Gait training;Neuromuscular re-education;Patient/family education;Manual techniques;Taping;Passive range of motion;Spinal Manipulations;Joint Manipulations;Dry needling    PT Next Visit Plan Reassess HEP, DN to lateral rt hip/quad, knee mobility gains    PT Home Exercise Plan [email or web address]    Consulted and Agree with Plan of Care Patient           Patient will benefit from skilled therapeutic intervention in order to improve the following deficits and impairments:  Abnormal gait, Decreased endurance, Hypomobility, Pain, Increased fascial restricitons, Decreased strength, Decreased activity tolerance, Decreased balance, Decreased mobility, Difficulty walking, Impaired perceived functional ability, Impaired flexibility, Decreased range of motion, Decreased coordination  Visit Diagnosis: Pain in right leg  Muscle weakness (generalized)  Difficulty in walking, not elsewhere classified  Stiffness of right knee, not elsewhere classified     Problem List Patient Active Problem List   Diagnosis Date Noted  . Primary osteoarthritis of right knee 01/24/2020  . OA (osteoarthritis) of hip 03/16/2018  . Elevated hemoglobin A1c 03/14/2018  . Severe obesity (BMI 35.0-35.9 with comorbidity) ([place]) 08/04/2014  . Hyperlipidemia with target LDL less than 70; statin intolerant 04/29/2013  . CAD S/P percutaneous  coronary angioplasty   . S/P laparoscopic cholecystectomy July 2014 02/18/2013  . OA (osteoarthritis) of knee 09/04/2012  . Essential hypertension 02/24/2007  . GERD 02/24/2007  . DEGENERATIVE JOINT DISEASE, GENERALIZED 02/24/2007   Scot Jun, PT, DPT, OCS, ATC 06/05/20  9:26 AM    [facility] Physical Therapy [address] [place], [place], [address] Phone: [phone]   Fax:  [phone]  Name: Rani Sisney MRN: [id number] Date of Birth: 1951/02/16

## 2020-06-07 ENCOUNTER — Telehealth: Payer: Self-pay

## 2020-06-07 NOTE — Telephone Encounter (Signed)
   Primary Cardiologist: Sanda Klein, MD  Chart reviewed as part of pre-operative protocol coverage. Because of Samuel Chambers's past medical history and time since last visit, he will require a follow-up visit in order to better assess preoperative cardiovascular risk.  Pre-op covering staff: - Please add "pre-op clearance" to the appointment notes so provider is aware. - Please contact requesting surgeon's office via preferred method (i.e, phone, fax) to inform them of need for appointment prior to surgery.  Abigail Butts, PA-C  06/07/2020, 3:24 PM

## 2020-06-07 NOTE — Telephone Encounter (Signed)
Patient Scheduled for appointment with Dr. Sallyanne Kuster on 06/13/2020. Medical clearance documented in appointment notes

## 2020-06-07 NOTE — Telephone Encounter (Signed)
   [facility] Pre-operative Risk Assessment    HEARTCARE STAFF: - Please ensure there is not already an duplicate clearance open for this procedure. - Under Visit Info/Reason for Call, type in Other and utilize the format Clearance MM/DD/YY or Clearance TBD. Do not use dashes or single digits. - If request is for dental extraction, please clarify the # of teeth to be extracted.  Request for surgical clearance:  1. What type of surgery is being performed? Lumbar Epidural steroid injection   2. When is this surgery scheduled? 06/16/2020   3. What type of clearance is required (medical clearance vs. Pharmacy clearance to hold med vs. Both)? Medical   4. Are there any medications that need to be held prior to surgery and how long?Clopidogrel 5 days   5. Practice name and name of physician performing surgery? [facility] Dr. Nelva Chambers   6. What is the office phone number? [phone] ext [phone]   7.   What is the office fax number? [phone]  8.   Anesthesia type (None, local, MAC, general) ? Not Stated   Samuel Chambers 06/07/2020, 3:17 PM  _________________________________________________________________   (provider comments below)

## 2020-06-08 ENCOUNTER — Ambulatory Visit (INDEPENDENT_AMBULATORY_CARE_PROVIDER_SITE_OTHER): Payer: Medicare Other | Admitting: Physical Therapy

## 2020-06-08 ENCOUNTER — Other Ambulatory Visit: Payer: Self-pay

## 2020-06-08 DIAGNOSIS — M25661 Stiffness of right knee, not elsewhere classified: Secondary | ICD-10-CM | POA: Diagnosis not present

## 2020-06-08 DIAGNOSIS — M48061 Spinal stenosis, lumbar region without neurogenic claudication: Secondary | ICD-10-CM | POA: Diagnosis not present

## 2020-06-08 DIAGNOSIS — M6281 Muscle weakness (generalized): Secondary | ICD-10-CM

## 2020-06-08 DIAGNOSIS — R262 Difficulty in walking, not elsewhere classified: Secondary | ICD-10-CM | POA: Diagnosis not present

## 2020-06-08 DIAGNOSIS — M79604 Pain in right leg: Secondary | ICD-10-CM | POA: Diagnosis not present

## 2020-06-08 DIAGNOSIS — Z96651 Presence of right artificial knee joint: Secondary | ICD-10-CM | POA: Diagnosis not present

## 2020-06-08 NOTE — Therapy (Signed)
[facility] [address] [place], [place], [address] Phone: [phone]   Fax:  [phone]  Physical Therapy Treatment  Patient Details  Name: Samuel Chambers MRN: [id number] Date of Birth: 1951-01-18 Referring Provider (PT): Dr. Ninfa Linden   Encounter Date: 06/08/2020   PT End of Session - 06/08/20 1225    Visit Number 2    Number of Visits 12    Date for PT Re-Evaluation 08/14/20    Authorization Type Medicare    Progress Note Due on Visit 10    PT Start Time 1145    PT Stop Time 1223    PT Time Calculation (min) 38 min    Activity Tolerance Patient tolerated treatment well    Behavior During Therapy [facility] for tasks assessed/performed           Past Medical History:  Diagnosis Date   Adenomatous colon polyp 03/2005   Anxiety    CAD S/P percutaneous coronary angioplasty 2008   PCI to circumflex with a Promus DES 2.5 mm x 8 mm   Degenerative joint disease    Diabetes mellitus without complication ([place])    diet controlled   Diverticulosis    Fatty liver 2016   pt denies   Fibromyalgia    GERD (gastroesophageal reflux disease)    Hyperlipidemia    statin intolerant   Hypertension    Internal hemorrhoids    Obesity, Class II, BMI 35-39.9    Pneumonia 2016   PONV (postoperative nausea and vomiting)     Past Surgical History:  Procedure Laterality Date   CHOLECYSTECTOMY N/A 02/18/2013   Procedure: LAPAROSCOPIC CHOLECYSTECTOMY WITH INTRAOPERATIVE CHOLANGIOGRAM;  Surgeon: Pedro Earls, MD;  Location: [facility];  Service: General;  Laterality: N/A;   COLONOSCOPY     CORONARY ANGIOPLASTY WITH STENT PLACEMENT  04/30/2007   2.5 mm Promus stent that was to 95% Circ;had 60-70% lesion to RCA   DOPPLER ECHOCARDIOGRAPHY  05/27/2002   [facility]normal EF 55-66%,   FOOT SURGERY Right    x2 fascia   HEEL SPUR SURGERY Right    HEMORRHOID SURGERY     Holter Monitor  04/07/2007   sinus tachy.;   KNEE ARTHROSCOPY   03/27/2012   Procedure: ARTHROSCOPY KNEE;  Surgeon: Gearlean Alf, MD;  [facility];  Service: Orthopedics;  Laterality: Left;   NM MYOCAR PERF WALL MOTION  12/19/2011   EXERCISED FOR 8-1/2 MINUTES RECHING 10 METABOLIC EQUIVALENTS. NO EVIDENCE  OF ISCHEMIA OR INFARCTION . EF 71%   renal dopplers  04/08/2007   relatively normal   TOTAL HIP ARTHROPLASTY Right 03/16/2018   Procedure: RIGHT TOTAL HIP ARTHROPLASTY ANTERIOR APPROACH;  Surgeon: Gaynelle Arabian, MD;  [facility];  Service: Orthopedics;  Laterality: Right;   TOTAL HIP ARTHROPLASTY Left about 10 to 12 years ago   TOTAL KNEE ARTHROPLASTY Left 09/04/2012   Procedure: TOTAL KNEE ARTHROPLASTY;  Surgeon: Gearlean Alf, MD;  [facility];  Service: Orthopedics;  Laterality: Left;   TOTAL KNEE ARTHROPLASTY Right 01/24/2020   Procedure: TOTAL KNEE ARTHROPLASTY;  Surgeon: Gaynelle Arabian, MD;  [facility];  Service: Orthopedics;  Laterality: Right;  21min    There were no vitals filed for this visit.   Subjective Assessment - 06/08/20 1210    Subjective relays he is here for DN. Pain is really bad today in his Rt hip and knee    Pertinent History History of bilateral hip and knee replacements.  Rt hip approx. 2 years ago,  Rt knee this year.    Limitations Standing;Walking    Diagnostic tests MRI on lumbar    Patient Stated Goals Reduce pain    Pain Onset More than a month ago              [facility] Adult PT Treatment/Exercise - 06/08/20 0001      Knee/Hip Exercises: Stretches   Piriformis Stretch Right;3 reps;20 seconds      Knee/Hip Exercises: Aerobic   Recumbent Bike 5 min L3, seat 8      Knee/Hip Exercises: Machines for Strengthening   Total Gym Leg Press SL 62 lbs 3X 10 bilat      Knee/Hip Exercises: Standing   Hip Abduction Both;15 reps    Abduction Limitations red    Hip Extension Both;15 reps    Extension Limitations red      Manual Therapy   Manual therapy comments compression Rt glute med  trigger points            Trigger Point Dry Needling - 06/08/20 0001    Consent Given? Yes    Education Handout Provided Previously provided    Muscles Treated Back/Hip Gluteus medius   Rt   Gluteus Medius Response Twitch response elicited         DN performed by Scot Jun, PT,DPT         PT Short Term Goals - 06/05/20 0856      PT SHORT TERM GOAL #1   Title Patient will demonstrate independent use of home exercise program to maintain progress from in clinic treatments.    Time 3    Period Weeks    Status New    Target Date 06/26/20             PT Long Term Goals - 06/05/20 0903      PT LONG TERM GOAL #1   Title Patient will demonstrate/report pain at worst less than or equal to 2/10 to facilitate minimal limitation in daily activity secondary to pain symptoms.    Time 10    Period Weeks    Status New    Target Date 08/14/20      PT LONG TERM GOAL #2   Title Patient will demonstrate independent use of home exercise program to facilitate ability to maintain/progress functional gains from skilled physical therapy services.    Time 10    Period Weeks    Status New    Target Date 08/14/20      PT LONG TERM GOAL #3   Title Pt. will demonstrate Rt knee AROM 0-120 deg to facilitate transfers, stairs, sitting at PLOF s limitation    Time 10    Period Weeks    Status New    Target Date 08/14/20      PT LONG TERM GOAL #4   Title Pt. will demonstrate Rt LE MMT 5/5 throughout to facilitate usual standing, walking, stairs, squat at PLOF s limitation.    Time 10    Period Weeks    Status New    Target Date 08/14/20      PT LONG TERM GOAL #5   Title Pt. will demonstrate bilateral SLS > 15 seconds to facilitate stablity on uneven surface ambulation for recreational activity return.    Time 10    Period Weeks    Status New    Target Date 08/14/20                 Plan - 06/08/20 1227  Clinical Impression Statement Session focused on manual  therapy and DN for trigger points, followed by stretching and light strenghtening for his Rt hip/knee. He relays he feels much better after session and improvments in ROM and gait following session. Continue POC.    Personal Factors and Comorbidities Comorbidity 3+    Comorbidities DM, fibromyalgia, DJD, HTN, hyperlipidemia    Examination-Activity Limitations Sit;Sleep;Squat;Stairs;Stand;Bend;Lift;Locomotion Level    Examination-Participation Restrictions Community Activity;Other;Yard Work   fishing, Administrator, sports Evolving/Moderate complexity    Rehab Potential Good    PT Frequency 2x / week    PT Duration Other (comment)   10 weeks   PT Treatment/Interventions ADLs/Self Care Home Management;Electrical Stimulation;Cryotherapy;Iontophoresis 4mg /ml Dexamethasone;Moist Heat;Balance training;Therapeutic exercise;Therapeutic activities;Functional mobility training;Stair training;Ultrasound;Gait training;Neuromuscular re-education;Patient/family education;Manual techniques;Taping;Passive range of motion;Spinal Manipulations;Joint Manipulations;Dry needling    PT Next Visit Plan Reassess HEP, DN to lateral rt hip/quad, knee mobility gains    PT Home Exercise Plan [id number]    Consulted and Agree with Plan of Care Patient           Patient will benefit from skilled therapeutic intervention in order to improve the following deficits and impairments:  Abnormal gait, Decreased endurance, Hypomobility, Pain, Increased fascial restricitons, Decreased strength, Decreased activity tolerance, Decreased balance, Decreased mobility, Difficulty walking, Impaired perceived functional ability, Impaired flexibility, Decreased range of motion, Decreased coordination  Visit Diagnosis: Pain in right leg  Muscle weakness (generalized)  Difficulty in walking, not elsewhere classified  Stiffness of right knee, not elsewhere classified     Problem List Patient Active Problem List     Diagnosis Date Noted   Primary osteoarthritis of right knee 01/24/2020   OA (osteoarthritis) of hip 03/16/2018   Elevated hemoglobin A1c 03/14/2018   Severe obesity (BMI 35.0-35.9 with comorbidity) ([place]) 08/04/2014   Hyperlipidemia with target LDL less than 70; statin intolerant 04/29/2013   CAD S/P percutaneous coronary angioplasty    S/P laparoscopic cholecystectomy July 2014 02/18/2013   OA (osteoarthritis) of knee 09/04/2012   Essential hypertension 02/24/2007   GERD 02/24/2007   DEGENERATIVE JOINT DISEASE, GENERALIZED 02/24/2007    Silvestre Mesi 06/08/2020, 12:29 PM  [facility] [address] [place], [place], [address] Phone: [phone]   Fax:  [phone]  Name: Samuel Chambers MRN: [id number] Date of Birth: October 03, 1950

## 2020-06-10 ENCOUNTER — Other Ambulatory Visit: Payer: Self-pay | Admitting: Cardiovascular Disease

## 2020-06-12 ENCOUNTER — Telehealth: Payer: Self-pay | Admitting: Cardiovascular Disease

## 2020-06-12 MED ORDER — LOSARTAN POTASSIUM 100 MG PO TABS: ORAL_TABLET | ORAL | 0 refills | Status: DC

## 2020-06-12 NOTE — Telephone Encounter (Signed)
Pt c/o medication issue:  1. Name of Medication: losartan (COZAAR) 100 MG tablet  2. How are you currently taking this medication (dosage and times per day)? As written  3. Are you having a reaction (difficulty breathing--STAT)? No   4. What is your medication issue? Patient needs a new prescription filled asap

## 2020-06-12 NOTE — Telephone Encounter (Signed)
Losartan refilled, appointment for 06/13/2020 confirmed.

## 2020-06-13 ENCOUNTER — Encounter: Payer: Self-pay | Admitting: Cardiovascular Disease

## 2020-06-13 ENCOUNTER — Ambulatory Visit (INDEPENDENT_AMBULATORY_CARE_PROVIDER_SITE_OTHER): Payer: Medicare Other | Admitting: Cardiovascular Disease

## 2020-06-13 ENCOUNTER — Other Ambulatory Visit: Payer: Self-pay

## 2020-06-13 VITALS — BP 132/84 | HR 75 | Ht 72.0 in | Wt 275.8 lb

## 2020-06-13 DIAGNOSIS — I2510 Atherosclerotic heart disease of native coronary artery without angina pectoris: Secondary | ICD-10-CM | POA: Diagnosis not present

## 2020-06-13 DIAGNOSIS — R011 Cardiac murmur, unspecified: Secondary | ICD-10-CM

## 2020-06-13 DIAGNOSIS — Z9861 Coronary angioplasty status: Secondary | ICD-10-CM | POA: Diagnosis not present

## 2020-06-13 DIAGNOSIS — E785 Hyperlipidemia, unspecified: Secondary | ICD-10-CM | POA: Diagnosis not present

## 2020-06-13 DIAGNOSIS — Z6835 Body mass index (BMI) 35.0-35.9, adult: Secondary | ICD-10-CM | POA: Diagnosis not present

## 2020-06-13 DIAGNOSIS — I10 Essential (primary) hypertension: Secondary | ICD-10-CM | POA: Diagnosis not present

## 2020-06-13 MED ORDER — ASPIRIN EC 81 MG PO TBEC
81.0000 mg | DELAYED_RELEASE_TABLET | Freq: Every day | ORAL | 3 refills | Status: DC
Start: 2020-06-13 — End: 2023-02-11

## 2020-06-13 NOTE — Progress Notes (Addendum)
Cardiology Office Note   Date:  06/13/2020   ID:  Samuel Chambers, DOB February 21, 1951, MRN [id number]  PCP:  Horald Pollen, MD  Cardiologist:  Sanda Klein, MD  Electrophysiologist:  None   Evaluation Performed:  Follow-Up Visit  Chief Complaint:  CAD  History of Present Illness:    Samuel Chambers is a 69 y.o. male with history of coronary artery disease, diet-controlled diabetes mellitus, hypercholesterolemia, essential hypertension, fibromyalgia.  He underwent right knee replacement in June 2021 and still has pain and limping (although not using a cane or walker). He has previously had a right hip replacement and also has lumbar spine disease: he is scheduled for a spinal injection later this week. He stopped his clopidogrel in anticipation of this. He has been on clopidogrel ever since his coronary stent in 2008, but has not had any coronary events or other arterial ischemic problems since then.  He feels pretty well right now.  He has no problems with angina or dyspnea either at rest or with activity.  He denies palpitations, dizziness, syncope, claudication, lower extremity edema or focal neurological complaints.  He takes clopidogrel chronically and has not had any serious bleeding complications.    He has gained about 20 pounds due to the coronavirus pandemic restrictions and his limited mobility.  Despite this, his DM management remains fair (A1c 6.1% without meds).  He has not had his lipids rechecked since November 2019 when his LDL cholesterol was 131. The issues surrounding attempts to improve his lipids have not changed:  He tells me that he has tried "every single statin there is" (my records show that he has failed atorvastatin, simvastatin, rosuvastatin, but I do not see pravastatin listed).  They all made his muscles ache so badly that he could not move.  Zetia caused similar problems.  He tried taking cholestyramine but this did not help his lipid profile and  also caused side effects.  He was prescribed Repatha but even after applying for assistance was unable to afford its cost.  (He would have had to pay $390 a month).  I offered referral to the lipid clinic or enrollment in a clinical trial, but he is not interested.  In general he shows a fairly firm mistrust for scientific recommendations.   Past Medical History:  Diagnosis Date  . Adenomatous colon polyp 03/2005  . Anxiety   . CAD S/P percutaneous coronary angioplasty 2008   PCI to circumflex with a Promus DES 2.5 mm x 8 mm  . Degenerative joint disease   . Diabetes mellitus without complication ([facility])    diet controlled  . Diverticulosis   . Fatty liver 2016   pt denies  . Fibromyalgia   . GERD (gastroesophageal reflux disease)   . Hyperlipidemia    statin intolerant  . Hypertension   . Internal hemorrhoids   . Obesity, Class II, BMI 35-39.9   . Pneumonia 2016  . PONV (postoperative nausea and vomiting)    Past Surgical History:  Procedure Laterality Date  . CHOLECYSTECTOMY N/A 02/18/2013   Procedure: LAPAROSCOPIC CHOLECYSTECTOMY WITH INTRAOPERATIVE CHOLANGIOGRAM;  Surgeon: Pedro Earls, MD;  [facility];  Service: General;  Laterality: N/A;  . COLONOSCOPY    . CORONARY ANGIOPLASTY WITH STENT PLACEMENT  04/30/2007   2.5 mm Promus stent that was to 95% Circ;had 60-70% lesion to RCA  . DOPPLER ECHOCARDIOGRAPHY  05/27/2002   [facility].-normal EF 55-66%,  . FOOT SURGERY Right    x2 fascia  .  HEEL SPUR SURGERY Right   . HEMORRHOID SURGERY    . Holter Monitor  04/07/2007   sinus tachy.;  . KNEE ARTHROSCOPY  03/27/2012   Procedure: ARTHROSCOPY KNEE;  Surgeon: Gearlean Alf, MD;  [facility];  Service: Orthopedics;  Laterality: Left;  . NM MYOCAR PERF WALL MOTION  12/19/2011   EXERCISED FOR 8-1/2 MINUTES RECHING 10 METABOLIC EQUIVALENTS. NO EVIDENCE  OF ISCHEMIA OR INFARCTION . EF 71%  . renal dopplers  04/08/2007   relatively normal  . TOTAL HIP ARTHROPLASTY Right  03/16/2018   Procedure: RIGHT TOTAL HIP ARTHROPLASTY ANTERIOR APPROACH;  Surgeon: Gaynelle Arabian, MD;  [facility];  Service: Orthopedics;  Laterality: Right;  . TOTAL HIP ARTHROPLASTY Left about 10 to 12 years ago  . TOTAL KNEE ARTHROPLASTY Left 09/04/2012   Procedure: TOTAL KNEE ARTHROPLASTY;  Surgeon: Gearlean Alf, MD;  [facility];  Service: Orthopedics;  Laterality: Left;  . TOTAL KNEE ARTHROPLASTY Right 01/24/2020   Procedure: TOTAL KNEE ARTHROPLASTY;  Surgeon: Gaynelle Arabian, MD;  [facility];  Service: Orthopedics;  Laterality: Right;  66min     Current Meds  Medication Sig  . DULoxetine (CYMBALTA) 60 MG capsule TAKE 1 CAPSULE(60 MG) BY MOUTH DAILY  . hydrochlorothiazide (HYDRODIURIL) 25 MG tablet TAKE 1 TABLET(25 MG) BY MOUTH DAILY (Patient taking differently: Take 25 mg by mouth daily. )  . losartan (COZAAR) 100 MG tablet TAKE 1 TABLET(100 MG) BY MOUTH DAILY  . metoprolol succinate (TOPROL-XL) 50 MG 24 hr tablet TAKE 1 TABLET BY MOUTH DAILY. TAKE WITH OR IMMEDIATELY FOLLOWING A MEAL  . [DISCONTINUED] clopidogrel (PLAVIX) 75 MG tablet TAKE 1 TABLET(75 MG) BY MOUTH DAILY (Patient taking differently: Take 75 mg by mouth daily. )     Allergies:   Crestor [rosuvastatin], Valsartan, and Warfarin and related   Social History   Tobacco Use  . Smoking status: Never Smoker  . Smokeless tobacco: Never Used  Vaping Use  . Vaping Use: Never used  Substance Use Topics  . Alcohol use: No  . Drug use: No     Family Hx: The patient's family history includes Breast cancer in his mother; Diabetes in his mother; Prostate cancer in his father. There is no history of Colon cancer.  ROS:   Please see the history of present illness.    All other systems are reviewed and are negative.   Prior CV studies:   The following studies were reviewed today  Labs/Other Tests and Data Reviewed:    EKG:  Ordered today and personally reviewed, shows NSR, possible old inferior MI, minor  nonspecific repol changes, QTc 422 ms, unchanged since 2017 and 2018  Recent Labs: 01/17/2020: ALT 36 01/25/2020: BUN 16; Creatinine, Ser 1.03; Hemoglobin 12.1; Platelets 215; Potassium 4.1; Sodium 136   Recent Lipid Panel Lab Results  Component Value Date/Time   CHOL 195 06/12/2018 09:39 AM   TRIG 124 06/12/2018 09:39 AM   HDL 39 (L) 06/12/2018 09:39 AM   CHOLHDL 5.0 06/12/2018 09:39 AM   CHOLHDL 4.9 09/21/2015 02:10 PM   LDLCALC 131 (H) 06/12/2018 09:39 AM   12/23/2019 Hgb A1c 6.1%  Wt Readings from Last 3 Encounters:  06/13/20 275 lb 12.8 oz (125.1 kg)  05/22/20 270 lb (122.5 kg)  01/24/20 271 lb 1 oz (123 kg)     Objective:    Vital Signs:  BP 132/84   Pulse 75   Ht 6' (1.829 m)   Wt 275 lb 12.8 oz (125.1 kg)  SpO2 95%   BMI 37.41 kg/m     General: Alert, oriented x3, no distress, severely obese Head: no evidence of trauma, PERRL, EOMI, no exophtalmos or lid lag, no myxedema, no xanthelasma; normal ears, nose and oropharynx Neck: normal jugular venous pulsations and no hepatojugular reflux; brisk carotid pulses without delay, but with loud bilateral carotid bruits Chest: clear to auscultation, no signs of consolidation by percussion or palpation, normal fremitus, symmetrical and full respiratory excursions Cardiovascular: normal position and quality of the apical impulse, regular rhythm, normal first and distinct second heart sounds, 2-3/6 aortic ejection murmur radiating to the carotids, no diastolic murmurs, rubs or gallops Abdomen: no tenderness or distention, no masses by palpation, no abnormal pulsatility or arterial bruits, normal bowel sounds, no hepatosplenomegaly Extremities: no clubbing, cyanosis or edema; 2+ radial, ulnar and brachial pulses bilaterally; 2+ right femoral, posterior tibial and dorsalis pedis pulses; 2+ left femoral, posterior tibial and dorsalis pedis pulses; no subclavian or femoral bruits Neurological: grossly nonfocal Psych: Normal mood and  affect   ASSESSMENT & PLAN:    1. Coronary artery disease involving native coronary artery of native heart without angina pectoris   2. Hyperlipidemia with target LDL less than 70; statin intolerant   3. Essential hypertension   4. Severe obesity (BMI 35.0-35.9 with comorbidity) ([place])   5. Murmur, cardiac      1. CAD: Asymptomatic.  He had a stent in the left circumflex coronary artery in 2008 and has not had coronary events since then.  He does not smoke.  Diabetes is diet-controlled.  He refuses lipid-lowering therapy.  He is taking a beta-blocker. He is off clopidogrel for the spinal injection. I think he can stop this and start aspirin 81 mg daily after the spinal injection 2. HLP: Target LDL less than 70.  He does not want to retry any statin although I offered to send a prescription for low-dose pravastatin which I suspect would be best tolerated.  He does not want to consider PCSK9 inhibitors due to the cost.  He refuses referral to the lipid clinic and does not have any interest in clinical trial enrollment.  I again expressed my concern that he is at risk for progression of his coronary disease and also getting other vascular problems. Unless he agrees to start some new therapy (or loses substantial weight) rechecking the lipid profile would serve little purpose. 3. HTN: fair control. 4. Obesity: DM controlled with diet only. Encourage weight loss, but he cannot exercise due to the pain. Maybe if the back injections help, he will try harder. 5. Murmur: highly suggestive of aortic stenosis. Check echo.   Patient Instructions  Medication Instructions:  STOP the Clopidogrel (Plavix) START Aspirin 81 mg once daily  *If you need a refill on your cardiac medications before your next appointment, please call your pharmacy*   Lab Work: None ordered If you have labs (blood work) drawn today and your tests are completely normal, you will receive your results only by: Marland Kitchen MyChart Message  (if you have MyChart) OR . A paper copy in the mail If you have any lab test that is abnormal or we need to change your treatment, we will call you to review the results.   Testing/Procedures: Your physician has requested that you have an echocardiogram. Echocardiography is a painless test that uses sound waves to create images of your heart. It provides your doctor with information about the size and shape of your heart and how well your heart's  chambers and valves are working. You may receive an ultrasound enhancing agent through an IV if needed to better visualize your heart during the echo.This procedure takes approximately one hour. There are no restrictions for this procedure. This will take place at the [address].   Follow-Up: At [facility], you and your health needs are our priority.  As part of our continuing mission to provide you with exceptional heart care, we have created designated Provider Care Teams.  These Care Teams include your primary Cardiologist (physician) and Advanced Practice Providers (APPs -  Physician Assistants and Nurse Practitioners) who all work together to provide you with the care you need, when you need it.  We recommend signing up for the patient portal called "MyChart".  Sign up information is provided on this After Visit Summary.  MyChart is used to connect with patients for Virtual Visits (Telemedicine).  Patients are able to view lab/test results, encounter notes, upcoming appointments, etc.  Non-urgent messages can be sent to your provider as well.   To learn more about what you can do with MyChart, go to [email or web address].    Your next appointment:   12 month(s)  The format for your next appointment:   In Person  Provider:   You may see Sanda Klein, MD or one of the following Advanced Practice Providers on your designated Care Team:    Almyra Deforest, PA-C  Fabian Sharp, [place] or   Roby Lofts,  PA-C     Signed, Sanda Klein, MD  06/13/2020 5:41 PM    [place]

## 2020-06-13 NOTE — Patient Instructions (Signed)
Medication Instructions:  STOP the Clopidogrel (Plavix) START Aspirin 81 mg once daily  *If you need a refill on your cardiac medications before your next appointment, please call your pharmacy*   Lab Work: None ordered If you have labs (blood work) drawn today and your tests are completely normal, you will receive your results only by: Marland Kitchen MyChart Message (if you have MyChart) OR . A paper copy in the mail If you have any lab test that is abnormal or we need to change your treatment, we will call you to review the results.   Testing/Procedures: Your physician has requested that you have an echocardiogram. Echocardiography is a painless test that uses sound waves to create images of your heart. It provides your doctor with information about the size and shape of your heart and how well your heart's chambers and valves are working. You may receive an ultrasound enhancing agent through an IV if needed to better visualize your heart during the echo.This procedure takes approximately one hour. There are no restrictions for this procedure. This will take place at the [address].   Follow-Up: At [facility], you and your health needs are our priority.  As part of our continuing mission to provide you with exceptional heart care, we have created designated Provider Care Teams.  These Care Teams include your primary Cardiologist (physician) and Advanced Practice Providers (APPs -  Physician Assistants and Nurse Practitioners) who all work together to provide you with the care you need, when you need it.  We recommend signing up for the patient portal called "MyChart".  Sign up information is provided on this After Visit Summary.  MyChart is used to connect with patients for Virtual Visits (Telemedicine).  Patients are able to view lab/test results, encounter notes, upcoming appointments, etc.  Non-urgent messages can be sent to your provider as well.   To learn more about what you  can do with MyChart, go to [email or web address].    Your next appointment:   12 month(s)  The format for your next appointment:   In Person  Provider:   You may see Sanda Klein, MD or one of the following Advanced Practice Providers on your designated Care Team:    Almyra Deforest, PA-C  Fabian Sharp, PA-C or   Roby Lofts, [place]

## 2020-06-14 ENCOUNTER — Encounter: Payer: Self-pay | Admitting: Rehabilitative and Restorative Service Providers"

## 2020-06-14 ENCOUNTER — Ambulatory Visit (INDEPENDENT_AMBULATORY_CARE_PROVIDER_SITE_OTHER): Payer: Medicare Other | Admitting: Rehabilitative and Restorative Service Providers"

## 2020-06-14 DIAGNOSIS — R262 Difficulty in walking, not elsewhere classified: Secondary | ICD-10-CM | POA: Diagnosis not present

## 2020-06-14 DIAGNOSIS — M79604 Pain in right leg: Secondary | ICD-10-CM | POA: Diagnosis not present

## 2020-06-14 DIAGNOSIS — M25661 Stiffness of right knee, not elsewhere classified: Secondary | ICD-10-CM | POA: Diagnosis not present

## 2020-06-14 DIAGNOSIS — M6281 Muscle weakness (generalized): Secondary | ICD-10-CM | POA: Diagnosis not present

## 2020-06-14 NOTE — Therapy (Signed)
[facility] Physical Therapy [address] [place], [place], [address] Phone: [phone]   Fax:  [phone]  Physical Therapy Treatment  Patient Details  Name: Samuel Chambers MRN: [id number] Date of Birth: 1951/04/05 Referring Provider (PT): Dr. Ninfa Linden   Encounter Date: 06/14/2020   PT End of Session - 06/14/20 1123    Visit Number 3    Number of Visits 12    Date for PT Re-Evaluation 08/14/20    Authorization Type Medicare    Progress Note Due on Visit 10    PT Start Time 1100    PT Stop Time 1140    PT Time Calculation (min) 40 min    Activity Tolerance Patient tolerated treatment well    Behavior During Therapy [facility] for tasks assessed/performed           Past Medical History:  Diagnosis Date  . Adenomatous colon polyp 03/2005  . Anxiety   . CAD S/P percutaneous coronary angioplasty 2008   PCI to circumflex with a Promus DES 2.5 mm x 8 mm  . Degenerative joint disease   . Diabetes mellitus without complication (HCC)    diet controlled  . Diverticulosis   . Fatty liver 2016   pt denies  . Fibromyalgia   . GERD (gastroesophageal reflux disease)   . Hyperlipidemia    statin intolerant  . Hypertension   . Internal hemorrhoids   . Obesity, Class II, BMI 35-39.9   . Pneumonia 2016  . PONV (postoperative nausea and vomiting)     Past Surgical History:  Procedure Laterality Date  . CHOLECYSTECTOMY N/A 02/18/2013   Procedure: LAPAROSCOPIC CHOLECYSTECTOMY WITH INTRAOPERATIVE CHOLANGIOGRAM;  Surgeon: Pedro Earls, MD;  Location: [facility];  Service: General;  Laterality: N/A;  . COLONOSCOPY    . CORONARY ANGIOPLASTY WITH STENT PLACEMENT  04/30/2007   2.5 mm Promus stent that was to 95% Circ;had 60-70% lesion to RCA  . DOPPLER ECHOCARDIOGRAPHY  05/27/2002   [facility].-normal EF 55-66%,  . FOOT SURGERY Right    x2 fascia  . HEEL SPUR SURGERY Right   . HEMORRHOID SURGERY    . Holter Monitor  04/07/2007   sinus tachy.;  . KNEE ARTHROSCOPY   03/27/2012   Procedure: ARTHROSCOPY KNEE;  Surgeon: Gearlean Alf, MD;  Location: [facility];  Service: Orthopedics;  Laterality: Left;  . NM MYOCAR PERF WALL MOTION  12/19/2011   EXERCISED FOR 8-1/2 MINUTES RECHING 10 METABOLIC EQUIVALENTS. NO EVIDENCE  OF ISCHEMIA OR INFARCTION . EF 71%  . renal dopplers  04/08/2007   relatively normal  . TOTAL HIP ARTHROPLASTY Right 03/16/2018   Procedure: RIGHT TOTAL HIP ARTHROPLASTY ANTERIOR APPROACH;  Surgeon: Gaynelle Arabian, MD;  [facility];  Service: Orthopedics;  Laterality: Right;  . TOTAL HIP ARTHROPLASTY Left about 10 to 12 years ago  . TOTAL KNEE ARTHROPLASTY Left 09/04/2012   Procedure: TOTAL KNEE ARTHROPLASTY;  Surgeon: Gearlean Alf, MD;  [facility]: [facility];  Service: Orthopedics;  Laterality: Left;  . TOTAL KNEE ARTHROPLASTY Right 01/24/2020   Procedure: TOTAL KNEE ARTHROPLASTY;  Surgeon: Gaynelle Arabian, MD;  [facility];  Service: Orthopedics;  Laterality: Right;  55min    There were no vitals filed for this visit.   Subjective Assessment - 06/14/20 1102    Subjective Pt. indicated feeling good improvements for most days and then felt symptoms returned some in last day or so.  Pt. stated severity was down.  Yesterday he noted pain in front of knee as well.  Pertinent History History of bilateral hip and knee replacements.  Rt hip approx. 2 years ago, Rt knee this year.    Limitations Standing;Walking    Diagnostic tests MRI on lumbar    Patient Stated Goals Reduce pain    Currently in Pain? Yes    Pain Score 7     Pain Location Leg    Pain Orientation Right    Pain Descriptors / Indicators Sore;Aching;Sharp    Pain Type Chronic pain    Pain Onset More than a month ago    Pain Frequency Intermittent    Aggravating Factors  sitting, walking    Pain Relieving Factors treatment last time                             [facility] Adult PT Treatment/Exercise - 06/14/20 0001      Knee/Hip Exercises: Stretches   Other  Knee/Hip Stretches prone Rt quad stretch c strap 30 sec x 3      Knee/Hip Exercises: Aerobic   Nustep Lvl 6 10 mins      Knee/Hip Exercises: Machines for Strengthening   Total Gym Leg Press SL 62 lbs 3X 10 bilat      Knee/Hip Exercises: Supine   Bridges 2 sets;10 reps      Knee/Hip Exercises: Sidelying   Clams Rt 3 x 15      Manual Therapy   Manual therapy comments compression Rt glute med trigger points, lateral vastus lateralis            Trigger Point Dry Needling - 06/14/20 0001    Consent Given? Yes    Education Handout Provided Previously provided    Muscles Treated Lower Quadrant Vastus lateralis    Muscles Treated Back/Hip Gluteus medius    Vastus lateralis Response Twitch response elicited    Gluteus Medius Response Twitch response elicited                  PT Short Term Goals - 06/05/20 0856      PT SHORT TERM GOAL #1   Title Patient will demonstrate independent use of home exercise program to maintain progress from in clinic treatments.    Time 3    Period Weeks    Status New    Target Date 06/26/20             PT Long Term Goals - 06/05/20 0903      PT LONG TERM GOAL #1   Title Patient will demonstrate/report pain at worst less than or equal to 2/10 to facilitate minimal limitation in daily activity secondary to pain symptoms.    Time 10    Period Weeks    Status New    Target Date 08/14/20      PT LONG TERM GOAL #2   Title Patient will demonstrate independent use of home exercise program to facilitate ability to maintain/progress functional gains from skilled physical therapy services.    Time 10    Period Weeks    Status New    Target Date 08/14/20      PT LONG TERM GOAL #3   Title Pt. will demonstrate Rt knee AROM 0-120 deg to facilitate transfers, stairs, sitting at PLOF s limitation    Time 10    Period Weeks    Status New    Target Date 08/14/20      PT LONG TERM GOAL #4   Title Pt. will demonstrate  Rt LE MMT 5/5  throughout to facilitate usual standing, walking, stairs, squat at PLOF s limitation.    Time 10    Period Weeks    Status New    Target Date 08/14/20      PT LONG TERM GOAL #5   Title Pt. will demonstrate bilateral SLS > 15 seconds to facilitate stablity on uneven surface ambulation for recreational activity return.    Time 10    Period Weeks    Status New    Target Date 08/14/20                 Plan - 06/14/20 1124    Clinical Impression Statement Positive improvement noted from last visit c trigger point release techniques.  Return of symptoms noted but Pt. was pleased from visit.  Rt lateral hip and latearal thigh musculature produced concordant symptoms today in clinic and treated appropriately.  Pt. may continue to benefit from skilled PT services for symptom relief and improved hip strength/mobility to promote return to PLOF.    Personal Factors and Comorbidities Comorbidity 3+    Comorbidities DM, fibromyalgia, DJD, HTN, hyperlipidemia    Examination-Activity Limitations Sit;Sleep;Squat;Stairs;Stand;Bend;Lift;Locomotion Level    Examination-Participation Restrictions Community Activity;Other;Yard Work   fishing, Administrator, sports Evolving/Moderate complexity    Rehab Potential Good    PT Frequency 2x / week    PT Duration Other (comment)   10 weeks   PT Treatment/Interventions ADLs/Self Care Home Management;Electrical Stimulation;Cryotherapy;Iontophoresis 4mg /ml Dexamethasone;Moist Heat;Balance training;Therapeutic exercise;Therapeutic activities;Functional mobility training;Stair training;Ultrasound;Gait training;Neuromuscular re-education;Patient/family education;Manual techniques;Taping;Passive range of motion;Spinal Manipulations;Joint Manipulations;Dry needling    PT Next Visit Plan Continued myofascial release techniques as helpful, hip strenght, progressing balance training for stability.    PT Home Exercise Plan [id number]    Consulted and  Agree with Plan of Care Patient           Patient will benefit from skilled therapeutic intervention in order to improve the following deficits and impairments:  Abnormal gait, Decreased endurance, Hypomobility, Pain, Increased fascial restricitons, Decreased strength, Decreased activity tolerance, Decreased balance, Decreased mobility, Difficulty walking, Impaired perceived functional ability, Impaired flexibility, Decreased range of motion, Decreased coordination  Visit Diagnosis: Pain in right leg  Muscle weakness (generalized)  Difficulty in walking, not elsewhere classified  Stiffness of right knee, not elsewhere classified     Problem List Patient Active Problem List   Diagnosis Date Noted  . Primary osteoarthritis of right knee 01/24/2020  . OA (osteoarthritis) of hip 03/16/2018  . Elevated hemoglobin A1c 03/14/2018  . Severe obesity (BMI 35.0-35.9 with comorbidity) ([place]) 08/04/2014  . Hyperlipidemia with target LDL less than 70; statin intolerant 04/29/2013  . CAD S/P percutaneous coronary angioplasty   . S/P laparoscopic cholecystectomy July 2014 02/18/2013  . OA (osteoarthritis) of knee 09/04/2012  . Essential hypertension 02/24/2007  . GERD 02/24/2007  . DEGENERATIVE JOINT DISEASE, GENERALIZED 02/24/2007    Scot Jun, PT, DPT, OCS, ATC 06/14/20  11:38 AM    [facility] Physical Therapy [address] [place], [place], [address] Phone: [phone]   Fax:  [phone]  Name: Samuel Chambers MRN: [id number] Date of Birth: [birth year]

## 2020-06-16 DIAGNOSIS — M5137 Other intervertebral disc degeneration, lumbosacral region: Secondary | ICD-10-CM | POA: Diagnosis not present

## 2020-06-19 ENCOUNTER — Encounter: Payer: Self-pay | Admitting: Orthopaedic Surgery

## 2020-06-19 ENCOUNTER — Ambulatory Visit (INDEPENDENT_AMBULATORY_CARE_PROVIDER_SITE_OTHER): Payer: Medicare Other | Admitting: Orthopaedic Surgery

## 2020-06-19 DIAGNOSIS — M7631 Iliotibial band syndrome, right leg: Secondary | ICD-10-CM | POA: Diagnosis not present

## 2020-06-19 DIAGNOSIS — I2510 Atherosclerotic heart disease of native coronary artery without angina pectoris: Secondary | ICD-10-CM | POA: Diagnosis not present

## 2020-06-19 DIAGNOSIS — M25561 Pain in right knee: Secondary | ICD-10-CM | POA: Diagnosis not present

## 2020-06-19 DIAGNOSIS — Z96651 Presence of right artificial knee joint: Secondary | ICD-10-CM

## 2020-06-19 DIAGNOSIS — G8929 Other chronic pain: Secondary | ICD-10-CM

## 2020-06-19 DIAGNOSIS — Z96641 Presence of right artificial hip joint: Secondary | ICD-10-CM | POA: Diagnosis not present

## 2020-06-19 DIAGNOSIS — Z9861 Coronary angioplasty status: Secondary | ICD-10-CM | POA: Diagnosis not present

## 2020-06-19 NOTE — Progress Notes (Signed)
The patient comes in feeling much better after Dr. Herma Mering provided an epidural steroid on him recently at [facility].  He is very pleased with Dr. Herma Mering his care and how he is doing overall.  He looks way better than he did did when I first saw him a month ago in the office.  He is also had outpatient physical therapy with dry needling.  He is walking more upright.  On exam he still lacks just a few degrees of extension of his right knee but his flexion is better overall.  He is walking without an assistive device.  At this point we will see him back in 3 months to see how he is doing overall.  No x-rays are needed at that visit unless he is having issues.  He will hold on physical therapy for now since he has made such an improvement.  However, if he feels like he needs to resume therapy, he will let us know.

## 2020-06-26 ENCOUNTER — Other Ambulatory Visit: Payer: Self-pay

## 2020-06-26 ENCOUNTER — Encounter: Payer: Self-pay | Admitting: Physical Therapy

## 2020-06-26 ENCOUNTER — Ambulatory Visit (INDEPENDENT_AMBULATORY_CARE_PROVIDER_SITE_OTHER): Payer: Medicare Other | Admitting: Physical Therapy

## 2020-06-26 DIAGNOSIS — R262 Difficulty in walking, not elsewhere classified: Secondary | ICD-10-CM

## 2020-06-26 DIAGNOSIS — M25661 Stiffness of right knee, not elsewhere classified: Secondary | ICD-10-CM | POA: Diagnosis not present

## 2020-06-26 DIAGNOSIS — M6281 Muscle weakness (generalized): Secondary | ICD-10-CM

## 2020-06-26 DIAGNOSIS — M79604 Pain in right leg: Secondary | ICD-10-CM | POA: Diagnosis not present

## 2020-06-26 NOTE — Therapy (Addendum)
[facility] Physical Therapy [address] [place], [place], [address] Phone: [phone]   Fax:  [phone]  Physical Therapy Treatment/Discharge  Patient Details  Name: Samuel Chambers MRN: [id number] Date of Birth: 1950-11-14 Referring Provider (PT): Dr. Ninfa Linden   Encounter Date: 06/26/2020   PT End of Session - 06/26/20 1011    Visit Number 4    Number of Visits 12    Date for PT Re-Evaluation 08/14/20    Authorization Type Medicare    Progress Note Due on Visit 10    PT Start Time 0924    PT Stop Time 1008    PT Time Calculation (min) 44 min    Activity Tolerance Patient tolerated treatment well    Behavior During Therapy [facility] for tasks assessed/performed           Past Medical History:  Diagnosis Date  . Adenomatous colon polyp 03/2005  . Anxiety   . CAD S/P percutaneous coronary angioplasty 2008   PCI to circumflex with a Promus DES 2.5 mm x 8 mm  . Degenerative joint disease   . Diabetes mellitus without complication (HCC)    diet controlled  . Diverticulosis   . Fatty liver 2016   pt denies  . Fibromyalgia   . GERD (gastroesophageal reflux disease)   . Hyperlipidemia    statin intolerant  . Hypertension   . Internal hemorrhoids   . Obesity, Class II, BMI 35-39.9   . Pneumonia 2016  . PONV (postoperative nausea and vomiting)     Past Surgical History:  Procedure Laterality Date  . CHOLECYSTECTOMY N/A 02/18/2013   Procedure: LAPAROSCOPIC CHOLECYSTECTOMY WITH INTRAOPERATIVE CHOLANGIOGRAM;  Surgeon: Pedro Earls, MD;  [facility];  Service: General;  Laterality: N/A;  . COLONOSCOPY    . CORONARY ANGIOPLASTY WITH STENT PLACEMENT  04/30/2007   2.5 mm Promus stent that was to 95% Circ;had 60-70% lesion to RCA  . DOPPLER ECHOCARDIOGRAPHY  05/27/2002   [facility].-normal EF 55-66%,  . FOOT SURGERY Right    x2 fascia  . HEEL SPUR SURGERY Right   . HEMORRHOID SURGERY    . Holter Monitor  04/07/2007   sinus tachy.;  . KNEE  ARTHROSCOPY  03/27/2012   Procedure: ARTHROSCOPY KNEE;  Surgeon: Gearlean Alf, MD;  [facility];  Service: Orthopedics;  Laterality: Left;  . NM MYOCAR PERF WALL MOTION  12/19/2011   EXERCISED FOR 8-1/2 MINUTES RECHING 10 METABOLIC EQUIVALENTS. NO EVIDENCE  OF ISCHEMIA OR INFARCTION . EF 71%  . renal dopplers  04/08/2007   relatively normal  . TOTAL HIP ARTHROPLASTY Right 03/16/2018   Procedure: RIGHT TOTAL HIP ARTHROPLASTY ANTERIOR APPROACH;  Surgeon: Gaynelle Arabian, MD;  [facility];  Service: Orthopedics;  Laterality: Right;  . TOTAL HIP ARTHROPLASTY Left about 10 to 12 years ago  . TOTAL KNEE ARTHROPLASTY Left 09/04/2012   Procedure: TOTAL KNEE ARTHROPLASTY;  Surgeon: Gearlean Alf, MD;  [facility];  Service: Orthopedics;  Laterality: Left;  . TOTAL KNEE ARTHROPLASTY Right 01/24/2020   Procedure: TOTAL KNEE ARTHROPLASTY;  Surgeon: Gaynelle Arabian, MD;  [facility];  Service: Orthopedics;  Laterality: Right;  100mn    There were no vitals filed for this visit.   Subjective Assessment - 06/26/20 0928    Subjective after the last session "it hurt like crazy."    Pertinent History History of bilateral hip and knee replacements.  Rt hip approx. 2 years ago, Rt knee this year.    Limitations Standing;Walking  Diagnostic tests MRI on lumbar    Patient Stated Goals Reduce pain    Currently in Pain? Yes    Pain Location Knee    Pain Orientation Right    Pain Descriptors / Indicators Aching;Sore;Sharp    Pain Type Chronic pain    Pain Onset More than a month ago    Pain Frequency Intermittent    Aggravating Factors  sitting, walking    Pain Relieving Factors treatment last time                             [facility] Adult PT Treatment/Exercise - 06/26/20 0945      Knee/Hip Exercises: Stretches   Passive Hamstring Stretch Left;3 reps;30 seconds    Passive Hamstring Stretch Limitations supine with strap with slight adduction for lateral stretch     Quad Stretch Left;3 reps;30 seconds    Quad Stretch Limitations prone with strap      Knee/Hip Exercises: Aerobic   Nustep L6 x 6 min      Knee/Hip Exercises: Supine   Bridges 3 sets;10 reps      Knee/Hip Exercises: Sidelying   Clams Rt 3 x 10      Manual Therapy   Manual therapy comments compression Rt vastus lateralis            Trigger Point Dry Needling - 06/26/20 0948    Consent Given? Yes    Education Handout Provided Previously provided    Muscles Treated Lower Quadrant Vastus lateralis    Vastus lateralis Response Twitch response elicited                  PT Short Term Goals - 06/26/20 1011      PT SHORT TERM GOAL #1   Title Patient will demonstrate independent use of home exercise program to maintain progress from in clinic treatments.    Time 3    Period Weeks    Status Achieved    Target Date 06/26/20             PT Long Term Goals - 06/26/20 1011      PT LONG TERM GOAL #1   Title Patient will demonstrate/report pain at worst less than or equal to 2/10 to facilitate minimal limitation in daily activity secondary to pain symptoms.    Time 10    Period Weeks    Status On-going    Target Date 08/14/20      PT LONG TERM GOAL #2   Title Patient will demonstrate independent use of home exercise program to facilitate ability to maintain/progress functional gains from skilled physical therapy services.    Time 10    Period Weeks    Status On-going      PT LONG TERM GOAL #3   Title Pt. will demonstrate Rt knee AROM 0-120 deg to facilitate transfers, stairs, sitting at PLOF s limitation    Time 10    Period Weeks    Status On-going      PT LONG TERM GOAL #4   Title Pt. will demonstrate Rt LE MMT 5/5 throughout to facilitate usual standing, walking, stairs, squat at PLOF s limitation.    Time 10    Period Weeks    Status On-going      PT LONG TERM GOAL #5   Title Pt. will demonstrate bilateral SLS > 15 seconds to facilitate stablity on  uneven surface ambulation for recreational activity return.  Time 10    Period Weeks    Status On-going                 Plan - 06/26/20 1011    Clinical Impression Statement Pt has met [facility] and overall doing well with PT.  He's demonstrating some improvement with pain improving in initial areas but still some lateral quad discomfort.  Will continue to benefit from PT to maximize function.    Personal Factors and Comorbidities Comorbidity 3+    Comorbidities DM, fibromyalgia, DJD, HTN, hyperlipidemia    Examination-Activity Limitations Sit;Sleep;Squat;Stairs;Stand;Bend;Lift;Locomotion Level    Examination-Participation Restrictions Community Activity;Other;Yard Work   fishing, Administrator, sports Evolving/Moderate complexity    Rehab Potential Good    PT Frequency 2x / week    PT Duration Other (comment)   10 weeks   PT Treatment/Interventions ADLs/Self Care Home Management;Electrical Stimulation;Cryotherapy;Iontophoresis 71m/ml Dexamethasone;Moist Heat;Balance training;Therapeutic exercise;Therapeutic activities;Functional mobility training;Stair training;Ultrasound;Gait training;Neuromuscular re-education;Patient/family education;Manual techniques;Taping;Passive range of motion;Spinal Manipulations;Joint Manipulations;Dry needling    PT Next Visit Plan Continued myofascial release techniques as helpful, hip strenght, progressing balance training for stability.    PT Home Exercise Plan [email or web address]   Consulted and Agree with Plan of Care Patient           Patient will benefit from skilled therapeutic intervention in order to improve the following deficits and impairments:  Abnormal gait, Decreased endurance, Hypomobility, Pain, Increased fascial restricitons, Decreased strength, Decreased activity tolerance, Decreased balance, Decreased mobility, Difficulty walking, Impaired perceived functional ability, Impaired flexibility, Decreased range of motion,  Decreased coordination  Visit Diagnosis: Pain in right leg  Muscle weakness (generalized)  Difficulty in walking, not elsewhere classified  Stiffness of right knee, not elsewhere classified     Problem List Patient Active Problem List   Diagnosis Date Noted  . Primary osteoarthritis of right knee 01/24/2020  . OA (osteoarthritis) of hip 03/16/2018  . Elevated hemoglobin A1c 03/14/2018  . Severe obesity (BMI 35.0-35.9 with [place]bidity) [place] 08/04/2014  . Hyperlipidemia with target LDL less than 70; statin intolerant 04/29/2013  . CAD S/P percutaneous coronary angioplasty   . S/P laparoscopic cholecystectomy July 2014 02/18/2013  . OA (osteoarthritis) of knee 09/04/2012  . Essential hypertension 02/24/2007  . GERD 02/24/2007  . DEGENERATIVE JOINT DISEASE, GENERALIZED 02/24/2007      SLaureen Abrahams PT, DPT 06/26/20 11:04 AM  PHYSICAL THERAPY DISCHARGE SUMMARY  Visits from Start of Care: 4  Current functional level related to goals / functional outcomes: See note   Remaining deficits: See note   Education / Equipment: HEP Plan: Patient agrees to discharge.  Patient goals were partially met. Patient is being discharged due to being pleased with the current functional level.  ?????     MD note indicated Pt. Intended to stop therapy due to overall improvements.  MScot Jun PT, DPT, OCS, ATC 07/05/20  1:35 PM      [facility] [address] [address]Phone: [phone]  Fax:  [phone] Name: JDaryl BeehlerMRN: [id number] of Birth: [birth year]

## 2020-06-28 ENCOUNTER — Encounter: Payer: Medicare Other | Admitting: Rehabilitative and Restorative Service Providers"

## 2020-06-30 DIAGNOSIS — M5416 Radiculopathy, lumbar region: Secondary | ICD-10-CM | POA: Diagnosis not present

## 2020-06-30 DIAGNOSIS — M48062 Spinal stenosis, lumbar region with neurogenic claudication: Secondary | ICD-10-CM | POA: Diagnosis not present

## 2020-07-03 ENCOUNTER — Telehealth (INDEPENDENT_AMBULATORY_CARE_PROVIDER_SITE_OTHER): Payer: Medicare Other | Admitting: Emergency Medicine

## 2020-07-03 ENCOUNTER — Encounter: Payer: Self-pay | Admitting: Emergency Medicine

## 2020-07-03 ENCOUNTER — Encounter: Payer: Medicare Other | Admitting: Rehabilitative and Restorative Service Providers"

## 2020-07-03 ENCOUNTER — Other Ambulatory Visit: Payer: Self-pay

## 2020-07-03 VITALS — Temp 98.7°F | Ht 72.0 in | Wt 270.0 lb

## 2020-07-03 DIAGNOSIS — Z9861 Coronary angioplasty status: Secondary | ICD-10-CM

## 2020-07-03 DIAGNOSIS — R0981 Nasal congestion: Secondary | ICD-10-CM

## 2020-07-03 DIAGNOSIS — J0100 Acute maxillary sinusitis, unspecified: Secondary | ICD-10-CM

## 2020-07-03 DIAGNOSIS — I2510 Atherosclerotic heart disease of native coronary artery without angina pectoris: Secondary | ICD-10-CM

## 2020-07-03 MED ORDER — AZITHROMYCIN 250 MG PO TABS: ORAL_TABLET | ORAL | 0 refills | Status: DC

## 2020-07-03 MED ORDER — PREDNISONE 20 MG PO TABS: 20.0000 mg | ORAL_TABLET | Freq: Every day | ORAL | 1 refills | Status: AC

## 2020-07-03 NOTE — Patient Instructions (Signed)
° ° ° °  If you have lab work done today you will be contacted with your lab results within the next 2 weeks.  If you have not heard from us then please contact us. The fastest way to get your results is to register for My Chart. ° ° °IF you received an x-ray today, you will receive an invoice from [facility]. Please contact [facility] at [phone] with questions or concerns regarding your invoice.  ° °IF you received labwork today, you will receive an invoice from [facility]. Please contact [facility] at [phone] with questions or concerns regarding your invoice.  ° °Our billing staff will not be able to assist you with questions regarding bills from these companies. ° °You will be contacted with the lab results as soon as they are available. The fastest way to get your results is to activate your My Chart account. Instructions are located on the last page of this paperwork. If you have not heard from us regarding the results in 2 weeks, please contact this office. °  ° ° ° °

## 2020-07-03 NOTE — Progress Notes (Signed)
Telemedicine Encounter- SOAP NOTE Established Patient Patient: Home  Provider: Office     This telephone encounter was conducted with the patient's (or proxy's) verbal consent via audio telecommunications: yes/no: Yes Patient was instructed to have this encounter in a suitably private space; and to only have persons present to whom they give permission to participate. In addition, patient identity was confirmed by use of name plus two identifiers (DOB and address).  I discussed the limitations, risks, security and privacy concerns of performing an evaluation and management service by telephone and the availability of in person appointments. I also discussed with the patient that there may be a patient responsible charge related to this service. The patient expressed understanding and agreed to proceed.  I spent a total of TIME; 0 MIN TO 60 MIN: 20 minutes talking with the patient or their proxy.  Chief Complaint  Patient presents with  . Sinusitis    brown drainage, sniffles, and productive cough/ cough worse at night x3-4 days. Get like this every year    Subjective   Samuel Chambers is a 69 y.o. male established patient. Telephone visit today complaining of 3 to 4-day history of sinus pressure and headache, runny nose, and productive cough.  No other significant symptoms.  HPI   Patient Active Problem List   Diagnosis Date Noted  . Primary osteoarthritis of right knee 01/24/2020  . OA (osteoarthritis) of hip 03/16/2018  . Elevated hemoglobin A1c 03/14/2018  . Severe obesity (BMI 35.0-35.9 with comorbidity) ([place]) 08/04/2014  . Hyperlipidemia with target LDL less than 70; statin intolerant 04/29/2013  . CAD S/P percutaneous coronary angioplasty   . S/P laparoscopic cholecystectomy July 2014 02/18/2013  . OA (osteoarthritis) of knee 09/04/2012  . Essential hypertension 02/24/2007  . GERD 02/24/2007  . DEGENERATIVE JOINT DISEASE, GENERALIZED 02/24/2007    Past Medical  History:  Diagnosis Date  . Adenomatous colon polyp 03/2005  . Anxiety   . CAD S/P percutaneous coronary angioplasty 2008   PCI to circumflex with a Promus DES 2.5 mm x 8 mm  . Degenerative joint disease   . Diabetes mellitus without complication (HCC)    diet controlled  . Diverticulosis   . Fatty liver 2016   pt denies  . Fibromyalgia   . GERD (gastroesophageal reflux disease)   . Hyperlipidemia    statin intolerant  . Hypertension   . Internal hemorrhoids   . Obesity, Class II, BMI 35-39.9   . Pneumonia 2016  . PONV (postoperative nausea and vomiting)     Current Outpatient Medications  Medication Sig Dispense Refill  . aspirin EC 81 MG tablet Take 1 tablet (81 mg total) by mouth daily. Swallow whole. 90 tablet 3  . DULoxetine (CYMBALTA) 60 MG capsule TAKE 1 CAPSULE(60 MG) BY MOUTH DAILY 90 capsule 0  . hydrochlorothiazide (HYDRODIURIL) 25 MG tablet TAKE 1 TABLET(25 MG) BY MOUTH DAILY (Patient taking differently: Take 25 mg by mouth daily. ) 90 tablet 3  . losartan (COZAAR) 100 MG tablet TAKE 1 TABLET(100 MG) BY MOUTH DAILY 90 tablet 0  . metoprolol succinate (TOPROL-XL) 50 MG 24 hr tablet TAKE 1 TABLET BY MOUTH DAILY. TAKE WITH OR IMMEDIATELY FOLLOWING A MEAL 90 tablet 2   No current facility-administered medications for this visit.    Allergies  Allergen Reactions  . Crestor [Rosuvastatin] Other (See Comments)    myalgias  . Valsartan Other (See Comments)    Made patient feel tired and no energy  . Warfarin And  Related Other (See Comments)    Fast heart beat, went down hill, felt like he was dying    Social History   Socioeconomic History  . Marital status: Married    Spouse name: Not on file  . Number of children: 1  . Years of education: Not on file  . Highest education level: Not on file  Occupational History    Employer: [employer]  Tobacco Use  . Smoking status: Never Smoker  . Smokeless tobacco: Never Used  Vaping Use  . Vaping Use: Never used    Substance and Sexual Activity  . Alcohol use: No  . Drug use: No  . Sexual activity: Not on file  Other Topics Concern  . Not on file  Social History Narrative   Very little 0-2 drinks a week   Social Determinants of Health   Financial Resource Strain:   . Difficulty of Paying Living Expenses: Not on file  Food Insecurity:   . Worried About Charity fundraiser in the Last Year: Not on file  . Ran Out of Food in the Last Year: Not on file  Transportation Needs:   . Lack of Transportation (Medical): Not on file  . Lack of Transportation (Non-Medical): Not on file  Physical Activity:   . Days of Exercise per Week: Not on file  . Minutes of Exercise per Session: Not on file  Stress:   . Feeling of Stress : Not on file  Social Connections:   . Frequency of Communication with Friends and Family: Not on file  . Frequency of Social Gatherings with Friends and Family: Not on file  . Attends Religious Services: Not on file  . Active Member of Clubs or Organizations: Not on file  . Attends Archivist Meetings: Not on file  . Marital Status: Not on file  Intimate Partner Violence:   . Fear of Current or Ex-Partner: Not on file  . Emotionally Abused: Not on file  . Physically Abused: Not on file  . Sexually Abused: Not on file    Review of Systems  Constitutional: Negative.  Negative for chills and fever.  HENT: Positive for congestion and sinus pain. Negative for sore throat.   Respiratory: Negative.  Negative for cough and shortness of breath.   Cardiovascular: Negative.  Negative for chest pain and palpitations.  Gastrointestinal: Negative.  Negative for abdominal pain, diarrhea, nausea and vomiting.  Genitourinary: Negative.  Negative for dysuria and hematuria.  Musculoskeletal: Negative.   Skin: Negative.  Negative for rash.  Neurological: Negative.  Negative for dizziness and headaches.  All other systems reviewed and are negative.   Objective  Alert and  oriented x3 no apparent respiratory distress Vitals as reported by the patient: Today's Vitals   07/03/20 1444  Temp: 98.7 F (37.1 C)  TempSrc: Temporal  Weight: 270 lb (122.5 kg)  Height: 6' (1.829 m)    There are no diagnoses linked to this encounter. [name] was seen today for sinusitis.  Diagnoses and all orders for this visit:  Acute non-recurrent maxillary sinusitis -     azithromycin (ZITHROMAX) 250 MG tablet; Sig as indicated  Sinus congestion -     predniSONE (DELTASONE) 20 MG tablet; Take 1 tablet (20 mg total) by mouth daily with breakfast for 5 days.     I discussed the assessment and treatment plan with the patient. The patient was provided an opportunity to ask questions and all were answered. The patient agreed with the plan  and demonstrated an understanding of the instructions.   The patient was advised to call back or seek an in-person evaluation if the symptoms worsen or if the condition fails to improve as anticipated.  I provided 20 minutes of non-face-to-face time during this encounter.  Horald Pollen, MD  Primary Care at [facility]

## 2020-07-05 ENCOUNTER — Encounter: Payer: Medicare Other | Admitting: Rehabilitative and Restorative Service Providers"

## 2020-07-10 ENCOUNTER — Encounter: Payer: Medicare Other | Admitting: Rehabilitative and Restorative Service Providers"

## 2020-07-12 ENCOUNTER — Encounter: Payer: Medicare Other | Admitting: Rehabilitative and Restorative Service Providers"

## 2020-07-14 DIAGNOSIS — M5416 Radiculopathy, lumbar region: Secondary | ICD-10-CM | POA: Diagnosis not present

## 2020-07-14 DIAGNOSIS — M5117 Intervertebral disc disorders with radiculopathy, lumbosacral region: Secondary | ICD-10-CM | POA: Diagnosis not present

## 2020-07-14 DIAGNOSIS — M48062 Spinal stenosis, lumbar region with neurogenic claudication: Secondary | ICD-10-CM | POA: Diagnosis not present

## 2020-07-17 ENCOUNTER — Encounter: Payer: Medicare Other | Admitting: Rehabilitative and Restorative Service Providers"

## 2020-07-19 ENCOUNTER — Encounter: Payer: Medicare Other | Admitting: Rehabilitative and Restorative Service Providers"

## 2020-08-01 ENCOUNTER — Ambulatory Visit (INDEPENDENT_AMBULATORY_CARE_PROVIDER_SITE_OTHER): Payer: Medicare Other | Admitting: Family Medicine

## 2020-08-01 VITALS — BP 132/94 | Ht 72.0 in | Wt 275.0 lb

## 2020-08-01 DIAGNOSIS — Z0000 Encounter for general adult medical examination without abnormal findings: Secondary | ICD-10-CM

## 2020-08-01 NOTE — Progress Notes (Signed)
Presents today for [employer] Visit   Date of last exam: 07-03-2020  Interpreter used for this visit? No  I connected with  Samuel Chambers on 08/01/20 by a telephone application and verified that I am speaking with the correct person using two identifiers.   I discussed the limitations of evaluation and management by telemedicine. The patient expressed understanding and agreed to proceed.  Patient location: home  Provider location: in office  I provided 20 minutes of non face - to - face time during this encounter.   Patient Care Team: Horald Pollen, MD as PCP - General (Internal Medicine) Sanda Klein, MD as PCP - Cardiology (Cardiology) Ladene Artist, MD as Consulting Physician (Gastroenterology) Sanda Klein, MD as Consulting Physician (Cardiology)   Other items to address today:   Discussed Eye/Dental Discussed Immunizations Patient declined    Other Screening: Last screening for diabetes: 12/23/2019 Last lipid screening:06/12/2018  ADVANCE DIRECTIVES: Discussed: yes On File: no Materials Provided: no  Immunization status:  Immunization History  Administered Date(s) Administered  . Influenza, High Dose Seasonal PF 05/14/2018  . Influenza,inj,Quad PF,6+ Mos 09/02/2016  . Pneumococcal Conjugate-13 09/02/2016  . Tdap 02/13/2014     There are no preventive care reminders to display for this patient.   Functional Status Survey: Is the patient deaf or have difficulty hearing?: No Does the patient have difficulty seeing, even when wearing glasses/contacts?: No Does the patient have difficulty concentrating, remembering, or making decisions?: No Does the patient have difficulty walking or climbing stairs?: No Does the patient have difficulty dressing or bathing?: No Does the patient have difficulty doing errands alone such as visiting a doctor's office or shopping?: No   6CIT Screen 08/01/2020 05/10/2019 10/14/2017   What Year? 0 points 0 points 0 points  What month? 0 points 0 points 0 points  What time? 0 points 0 points 0 points  Count back from 20 0 points 0 points 0 points  Months in reverse 0 points 0 points 0 points  Repeat phrase 0 points 0 points 6 points  Total Score 0 0 6      [place] Visit from 05/10/2019 in [facility] [place]  AUDIT-C Score 0       Home Environment:  No trouble climbing stairs Lives in one story home with wife No scattered rugs Yes grab bars Adequate lighting / no clutter   Patient Active Problem List   Diagnosis Date Noted  . Primary osteoarthritis of right knee 01/24/2020  . OA (osteoarthritis) of hip 03/16/2018  . Elevated hemoglobin A1c 03/14/2018  . Severe obesity (BMI 35.0-35.9 with comorbidity) ([place]) 08/04/2014  . Hyperlipidemia with target LDL less than 70; statin intolerant 04/29/2013  . CAD S/P percutaneous coronary angioplasty   . S/P laparoscopic cholecystectomy July 2014 02/18/2013  . OA (osteoarthritis) of knee 09/04/2012  . Essential hypertension 02/24/2007  . GERD 02/24/2007  . DEGENERATIVE JOINT DISEASE, GENERALIZED 02/24/2007     Past Medical History:  Diagnosis Date  . Adenomatous colon polyp 03/2005  . Anxiety   . CAD S/P percutaneous coronary angioplasty 2008   PCI to circumflex with a Promus DES 2.5 mm x 8 mm  . Degenerative joint disease   . Diabetes mellitus without complication (HCC)    diet controlled  . Diverticulosis   . Fatty liver 2016   pt denies  . Fibromyalgia   . GERD (gastroesophageal reflux disease)   . Heart murmur    Phreesia  07/29/2020  . Hyperlipidemia    statin intolerant  . Hypertension   . Internal hemorrhoids   . Obesity, Class II, BMI 35-39.9   . Pneumonia 2016  . PONV (postoperative nausea and vomiting)      Past Surgical History:  Procedure Laterality Date  . CHOLECYSTECTOMY N/A 02/18/2013   Procedure: LAPAROSCOPIC CHOLECYSTECTOMY WITH INTRAOPERATIVE CHOLANGIOGRAM;   Surgeon: Pedro Earls, MD;  Location: [facility];  Service: General;  Laterality: N/A;  . COLONOSCOPY    . CORONARY ANGIOPLASTY WITH STENT PLACEMENT  04/30/2007   2.5 mm Promus stent that was to 95% Circ;had 60-70% lesion to RCA  . DOPPLER ECHOCARDIOGRAPHY  05/27/2002   [facility].-normal EF 55-66%,  . FOOT SURGERY Right    x2 fascia  . HEEL SPUR SURGERY Right   . HEMORRHOID SURGERY    . Holter Monitor  04/07/2007   sinus tachy.;  . JOINT REPLACEMENT N/A    Phreesia 07/29/2020  . KNEE ARTHROSCOPY  03/27/2012   Procedure: ARTHROSCOPY KNEE;  Surgeon: Gearlean Alf, MD;  Location: [facility];  Service: Orthopedics;  Laterality: Left;  . NM MYOCAR PERF WALL MOTION  12/19/2011   EXERCISED FOR 8-1/2 MINUTES RECHING 10 METABOLIC EQUIVALENTS. NO EVIDENCE  OF ISCHEMIA OR INFARCTION . EF 71%  . renal dopplers  04/08/2007   relatively normal  . TOTAL HIP ARTHROPLASTY Right 03/16/2018   Procedure: RIGHT TOTAL HIP ARTHROPLASTY ANTERIOR APPROACH;  Surgeon: Gaynelle Arabian, MD;  Location: [facility];  Service: Orthopedics;  Laterality: Right;  . TOTAL HIP ARTHROPLASTY Left about 10 to 12 years ago  . TOTAL KNEE ARTHROPLASTY Left 09/04/2012   Procedure: TOTAL KNEE ARTHROPLASTY;  Surgeon: Gearlean Alf, MD;  Location: [facility];  Service: Orthopedics;  Laterality: Left;  . TOTAL KNEE ARTHROPLASTY Right 01/24/2020   Procedure: TOTAL KNEE ARTHROPLASTY;  Surgeon: Gaynelle Arabian, MD;  Location: [facility];  Service: Orthopedics;  Laterality: Right;  76min     Family History  Problem Relation Age of Onset  . Breast cancer Mother   . Diabetes Mother   . Prostate cancer Father   . Colon cancer Neg Hx      Social History   Socioeconomic History  . Marital status: Married    Spouse name: Not on file  . Number of children: 1  . Years of education: Not on file  . Highest education level: Not on file  Occupational History    Employer: [employer]  Tobacco Use  . Smoking status: Never Smoker  . Smokeless  tobacco: Never Used  Vaping Use  . Vaping Use: Never used  Substance and Sexual Activity  . Alcohol use: No  . Drug use: No  . Sexual activity: Not on file  Other Topics Concern  . Not on file  Social History Narrative   Very little 0-2 drinks a week   Social Determinants of Health   Financial Resource Strain: Not on file  Food Insecurity: Not on file  Transportation Needs: Not on file  Physical Activity: Not on file  Stress: Not on file  Social Connections: Not on file  Intimate Partner Violence: Not on file     Allergies  Allergen Reactions  . Crestor [Rosuvastatin] Other (See Comments)    myalgias  . Valsartan Other (See Comments)    Made patient feel tired and no energy  . Warfarin And Related Other (See Comments)    Fast heart beat, went down hill, felt like he was dying  Prior to Admission medications   Medication Sig Start Date End Date Taking? Authorizing Provider  aspirin EC 81 MG tablet Take 1 tablet (81 mg total) by mouth daily. Swallow whole. 06/13/20  Yes Croitoru, Mihai, MD  DULoxetine (CYMBALTA) 60 MG capsule TAKE 1 CAPSULE(60 MG) BY MOUTH DAILY 04/21/20  Yes Sagardia, Eilleen Kempf, MD  hydrochlorothiazide (HYDRODIURIL) 25 MG tablet TAKE 1 TABLET(25 MG) BY MOUTH DAILY Patient taking differently: Take 25 mg by mouth daily. 06/11/19  Yes Croitoru, Mihai, MD  losartan (COZAAR) 100 MG tablet TAKE 1 TABLET(100 MG) BY MOUTH DAILY 06/12/20  Yes Croitoru, Mihai, MD  azithromycin (ZITHROMAX) 250 MG tablet Sig as indicated Patient not taking: Reported on 08/01/2020 07/03/20   Georgina Quint, MD  metoprolol succinate (TOPROL-XL) 50 MG 24 hr tablet TAKE 1 TABLET BY MOUTH DAILY. TAKE WITH OR IMMEDIATELY FOLLOWING A MEAL 04/20/20   Croitoru, Rachelle Hora, MD     Depression screen [facility] 2/9 08/01/2020 07/03/2020 12/23/2019 07/19/2019 05/10/2019  Decreased Interest 0 0 0 0 0  Down, Depressed, Hopeless 0 0 0 0 0  PHQ - 2 Score 0 0 0 0 0  Altered sleeping - - - - -  Tired,  decreased energy - - - - -  Change in appetite - - - - -  Feeling bad or failure about yourself  - - - - -  Trouble concentrating - - - - -  Moving slowly or fidgety/restless - - - - -  Suicidal thoughts - - - - -  PHQ-9 Score - - - - -  Difficult doing work/chores - - - - -     Fall Risk  08/01/2020 07/03/2020 12/23/2019 07/19/2019 05/10/2019  Falls in the past year? 0 0 0 0 0  Number falls in past yr: 0 0 - - 0  Injury with Fall? 0 0 - - 0  Risk for fall due to : - - - - -  Risk for fall due to: Comment - - - - -  Follow up Falls evaluation completed;Education provided Falls evaluation completed Falls evaluation completed Falls evaluation completed Falls evaluation completed;Education provided;Falls prevention discussed      PHYSICAL EXAM: BP (!) 132/94 Comment: taken from a previous visit/ not in clinic  Ht 6' (1.829 m)   Wt 275 lb (124.7 kg)   BMI 37.30 kg/m    Wt Readings from Last 3 Encounters:  08/01/20 275 lb (124.7 kg)  07/03/20 270 lb (122.5 kg)  06/13/20 275 lb 12.8 oz (125.1 kg)       Education/Counseling provided regarding diet and exercise, prevention of chronic diseases, smoking/tobacco cessation, if applicable, and reviewed "Covered Medicare Preventive Services."

## 2020-08-01 NOTE — Patient Instructions (Addendum)
Thank you for taking time to come for your Medicare Wellness Visit. I appreciate your ongoing commitment to your health goals. Please review the following plan we discussed and let me know if I can assist you in the future.  Leroy Kennedy LPN  Preventive Care 70 Years and Older, Male Preventive care refers to lifestyle choices and visits with your health care provider that can promote health and wellness. This includes:  A yearly physical exam. This is also called an annual well check.  Regular dental and eye exams.  Immunizations.  Screening for certain conditions.  Healthy lifestyle choices, such as diet and exercise. What can I expect for my preventive care visit? Physical exam Your health care provider will check:  Height and weight. These may be used to calculate body mass index (BMI), which is a measurement that tells if you are at a healthy weight.  Heart rate and blood pressure.  Your skin for abnormal spots. Counseling Your health care provider may ask you questions about:  Alcohol, tobacco, and drug use.  Emotional well-being.  Home and relationship well-being.  Sexual activity.  Eating habits.  History of falls.  Memory and ability to understand (cognition).  Work and work Statistician. What immunizations do I need?  Influenza (flu) vaccine  This is recommended every year. Tetanus, diphtheria, and pertussis (Tdap) vaccine  You may need a Td booster every 10 years. Varicella (chickenpox) vaccine  You may need this vaccine if you have not already been vaccinated. Zoster (shingles) vaccine  You may need this after age 66. Pneumococcal conjugate (PCV13) vaccine  One dose is recommended after age 84. Pneumococcal polysaccharide (PPSV23) vaccine  One dose is recommended after age 88. Measles, mumps, and rubella (MMR) vaccine  You may need at least one dose of MMR if you were born in 1957 or later. You may also need a second dose. Meningococcal  conjugate (MenACWY) vaccine  You may need this if you have certain conditions. Hepatitis A vaccine  You may need this if you have certain conditions or if you travel or work in places where you may be exposed to hepatitis A. Hepatitis B vaccine  You may need this if you have certain conditions or if you travel or work in places where you may be exposed to hepatitis B. Haemophilus influenzae type b (Hib) vaccine  You may need this if you have certain conditions. You may receive vaccines as individual doses or as more than one vaccine together in one shot (combination vaccines). Talk with your health care provider about the risks and benefits of combination vaccines. What tests do I need? Blood tests  Lipid and cholesterol levels. These may be checked every 5 years, or more frequently depending on your overall health.  Hepatitis C test.  Hepatitis B test. Screening  Lung cancer screening. You may have this screening every year starting at age 24 if you have a 30-pack-year history of smoking and currently smoke or have quit within the past 15 years.  Colorectal cancer screening. All adults should have this screening starting at age 52 and continuing until age 61. Your health care provider may recommend screening at age 57 if you are at increased risk. You will have tests every 1-10 years, depending on your results and the type of screening test.  Prostate cancer screening. Recommendations will vary depending on your family history and other risks.  Diabetes screening. This is done by checking your blood sugar (glucose) after you have not eaten for  a while (fasting). You may have this done every 1-3 years.  Abdominal aortic aneurysm (AAA) screening. You may need this if you are a current or former smoker.  Sexually transmitted disease (STD) testing. Follow these instructions at home: Eating and drinking  Eat a diet that includes fresh fruits and vegetables, whole grains, lean  protein, and low-fat dairy products. Limit your intake of foods with high amounts of sugar, saturated fats, and salt.  Take vitamin and mineral supplements as recommended by your health care provider.  Do not drink alcohol if your health care provider tells you not to drink.  If you drink alcohol: ? Limit how much you have to 0-2 drinks a day. ? Be aware of how much alcohol is in your drink. In the [place] one drink equals one 12 oz bottle of beer (355 mL), one 5 oz glass of wine (148 mL), or one 1 oz glass of hard liquor (44 mL). Lifestyle  Take daily care of your teeth and gums.  Stay active. Exercise for at least 30 minutes on 5 or more days each week.  Do not use any products that contain nicotine or tobacco, such as cigarettes, e-cigarettes, and chewing tobacco. If you need help quitting, ask your health care provider.  If you are sexually active, practice safe sex. Use a condom or other form of protection to prevent STIs (sexually transmitted infections).  Talk with your health care provider about taking a low-dose aspirin or statin. What's next?  Visit your health care provider once a year for a well check visit.  Ask your health care provider how often you should have your eyes and teeth checked.  Stay up to date on all vaccines. This information is not intended to replace advice given to you by your health care provider. Make sure you discuss any questions you have with your health care provider. Document Revised: 07/09/2018 Document Reviewed: 07/09/2018 Elsevier Patient Education  2020 Elsevier Inc.  

## 2020-08-04 ENCOUNTER — Telehealth: Payer: Self-pay | Admitting: Cardiovascular Disease

## 2020-08-04 MED ORDER — HYDROCHLOROTHIAZIDE 25 MG PO TABS: ORAL_TABLET | ORAL | 3 refills | Status: DC

## 2020-08-04 NOTE — Telephone Encounter (Signed)
*  STAT* If patient is at the pharmacy, call can be transferred to refill team.   1. Which medications need to be refilled? (please list name of each medication and dose if known)  Hydrochlorothiazide 25 mg  2. Which pharmacy/location (including street and city if local pharmacy) is medication to be sent to? [place] [facility] [place]  3. Do they need a 30 day or 90 day supply? 90 patient has been out for about a week and 1/2

## 2020-08-07 ENCOUNTER — Other Ambulatory Visit: Payer: Self-pay

## 2020-08-07 ENCOUNTER — Ambulatory Visit (HOSPITAL_COMMUNITY): Payer: Medicare Other | Attending: Internal Medicine

## 2020-08-07 DIAGNOSIS — R011 Cardiac murmur, unspecified: Secondary | ICD-10-CM

## 2020-08-07 LAB — ECHOCARDIOGRAM COMPLETE
AR max vel: 1.98 cm2
AV Area VTI: 2.29 cm2
AV Area mean vel: 2.07 cm2
AV Mean grad: 12.3 mmHg
AV Peak grad: 25.4 mmHg
Ao pk vel: 2.52 m/s
Area-P 1/2: 2.07 cm2
S' Lateral: 3.3 cm

## 2020-08-21 ENCOUNTER — Encounter: Payer: Self-pay | Admitting: Emergency Medicine

## 2020-08-21 NOTE — Telephone Encounter (Signed)
I attested AWS by Almyra Free in December. Will forward to Dr. Mitchel Honour as he treated prior sinus symptoms. Thanks. -JG.

## 2020-08-21 NOTE — Telephone Encounter (Signed)
Pt still struggling with sinus issues since 06/2020 new OV? Please advise  Sagardia pt you saw for CPE in December he did complain of sinus issues then too per your note

## 2020-08-23 NOTE — Telephone Encounter (Signed)
Call patient today.  Inquire about his condition.  Thanks.

## 2020-08-24 ENCOUNTER — Encounter: Payer: Self-pay | Admitting: Emergency Medicine

## 2020-08-24 ENCOUNTER — Other Ambulatory Visit: Payer: Self-pay | Admitting: Emergency Medicine

## 2020-08-24 MED ORDER — AMOXICILLIN-POT CLAVULANATE 875-125 MG PO TABS: 1.0000 | ORAL_TABLET | Freq: Two times a day (BID) | ORAL | 0 refills | Status: AC

## 2020-08-24 MED ORDER — PREDNISONE 20 MG PO TABS: 40.0000 mg | ORAL_TABLET | Freq: Every day | ORAL | 0 refills | Status: AC

## 2020-08-24 NOTE — Telephone Encounter (Signed)
Called pt spoke with him, he is doing about the same as the other day, sinus/head congestion, a productive cough, drainage at night, has been going on about 1 week though he notes the cough never went away completely from last time.  Denies fever, chills, body aches or GI upset.   Would like prednisone and Abx but stronger than before.   Do you want new visit since most recent was [facility] from [place] and signed off by Dr Carlota Raspberry?

## 2020-08-24 NOTE — Telephone Encounter (Signed)
Pt called and is requesting to have a nruse give him a call back. Please advise.

## 2020-08-24 NOTE — Telephone Encounter (Signed)
Prescription for Augmentin and prednisone sent.  Thanks.

## 2020-08-24 NOTE — Telephone Encounter (Signed)
Called and informed pt he is going to pick up now

## 2020-08-26 IMAGING — US US EXTREM LOW VENOUS*R*
1 series · 13 of 24 positions shown · non-contrast
Comparison: None.

CLINICAL DATA: Initial evaluation for possible DVT, recent knee
surgery.



[Series 1: us extrem low venous*right* · 13 of 33 slices shown]
[im 1/33]
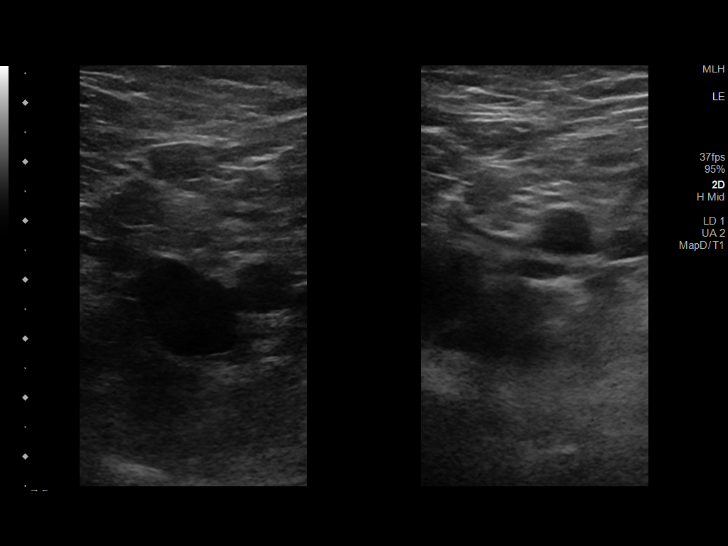
[im 3/33]
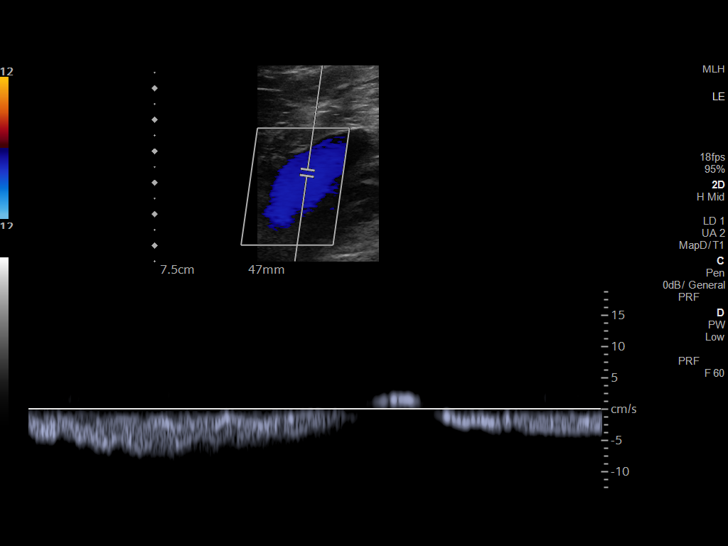
[im 6/33]
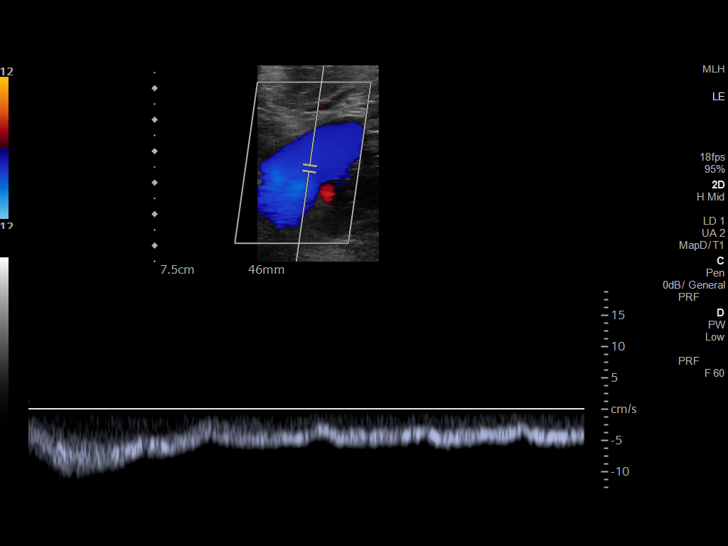
[im 9/33]
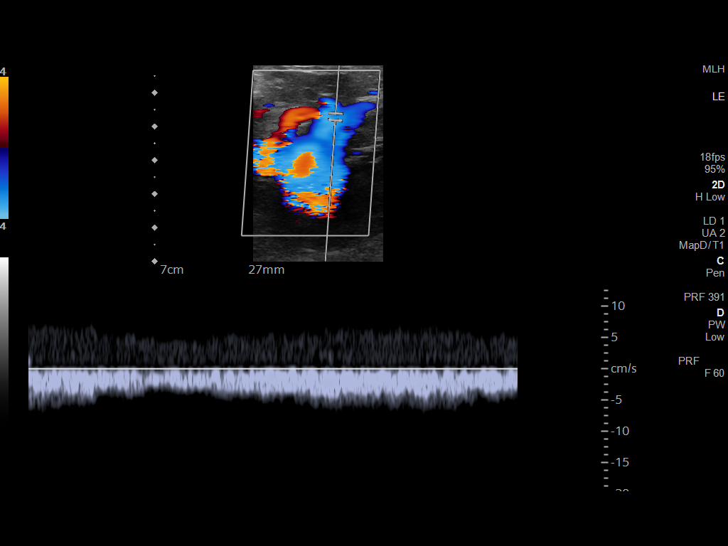
[im 12/33]
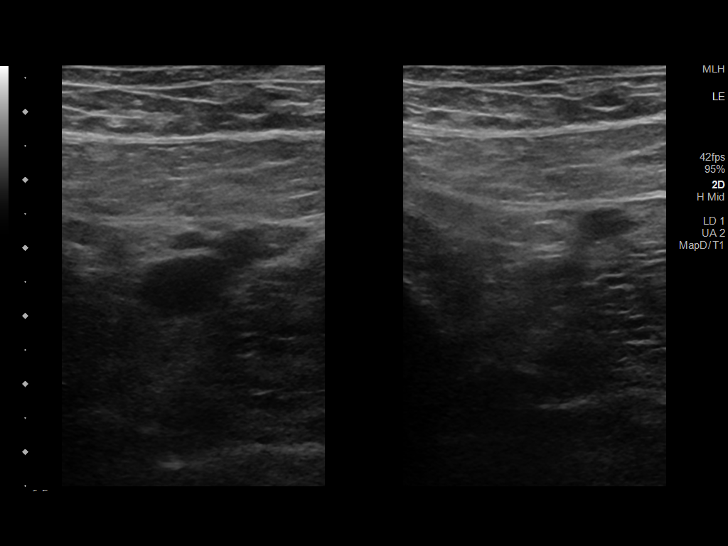
[im 14/33]
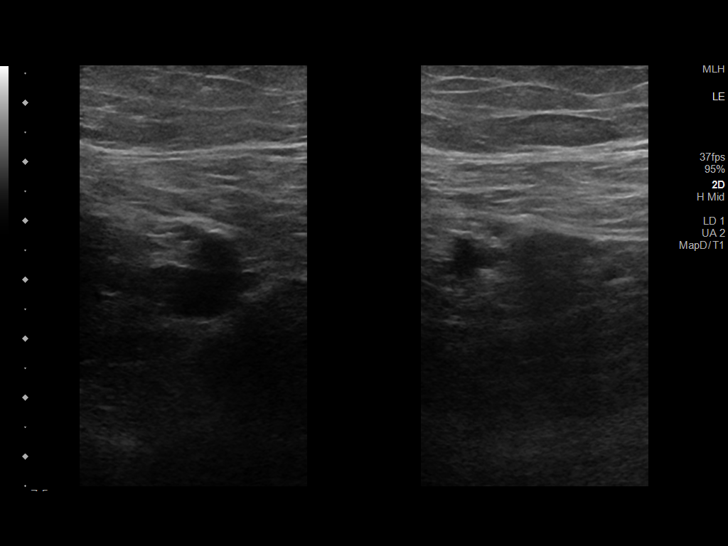
[im 17/33]
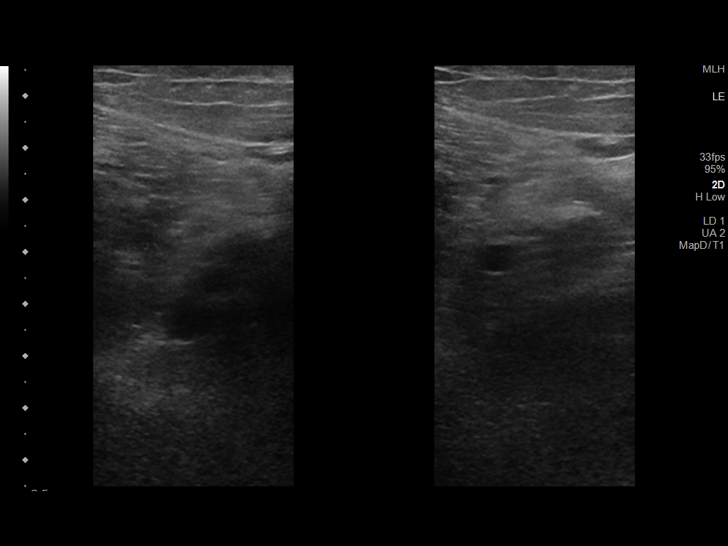
[im 19/33]
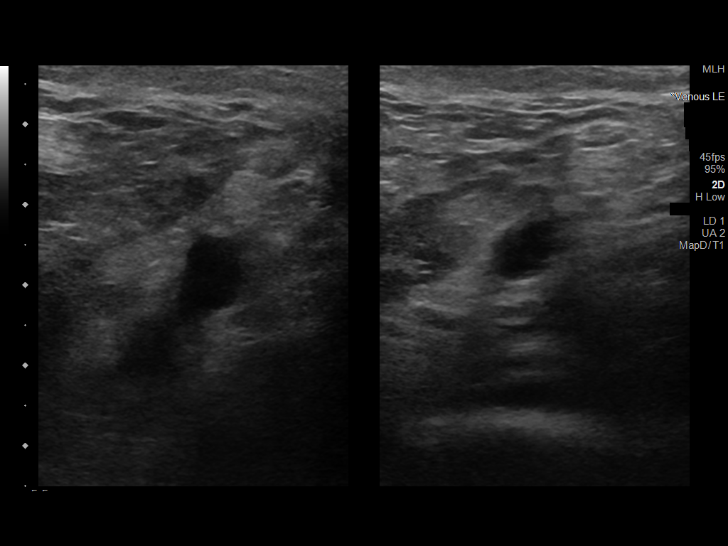
[im 21/33]
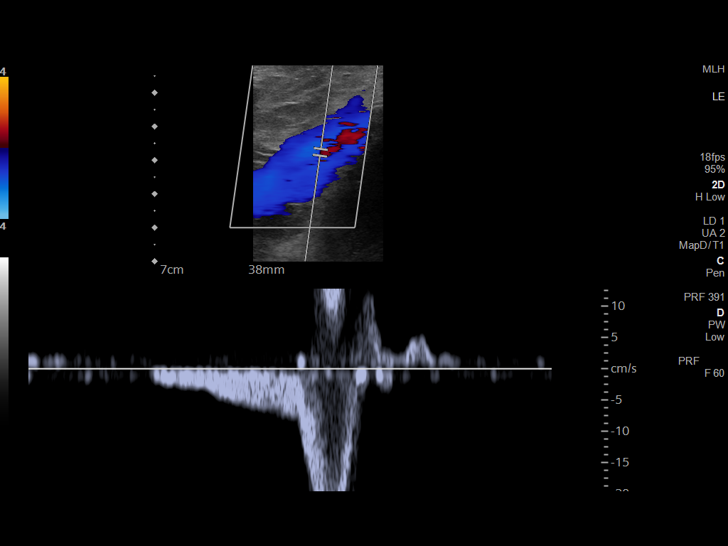
[im 24/33]
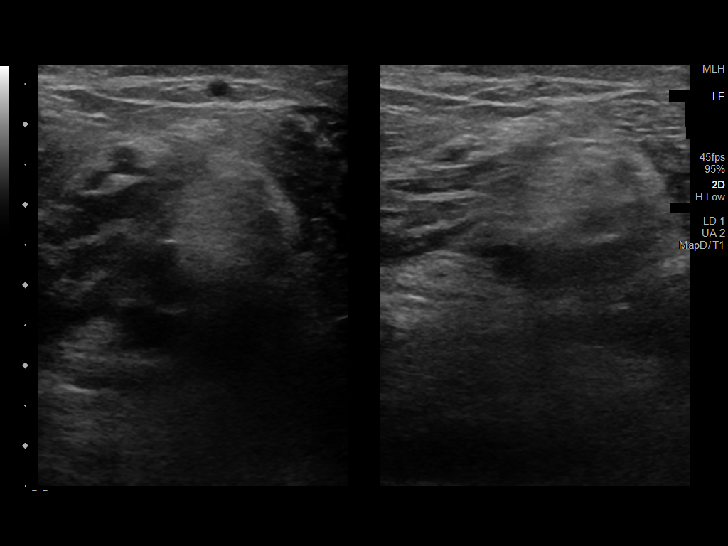
[im 27/33]
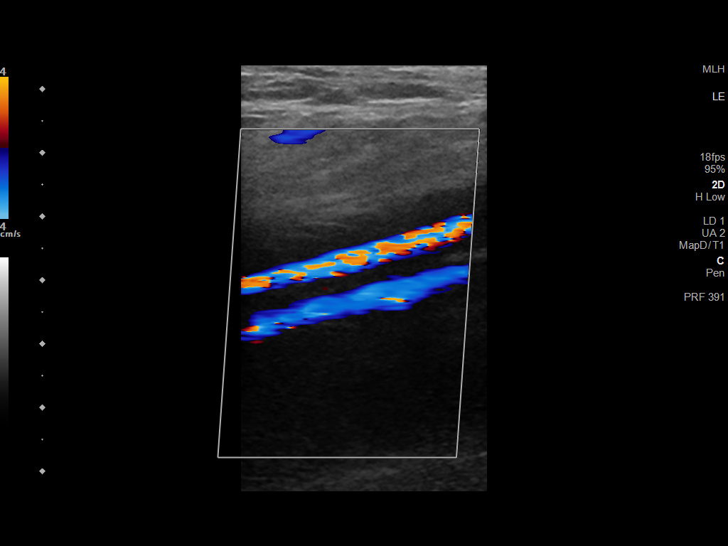
[im 30/33]
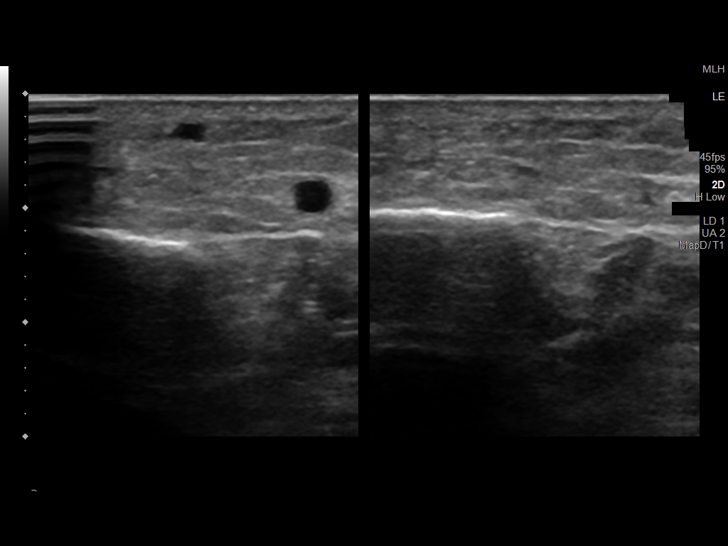
[im 33/33]
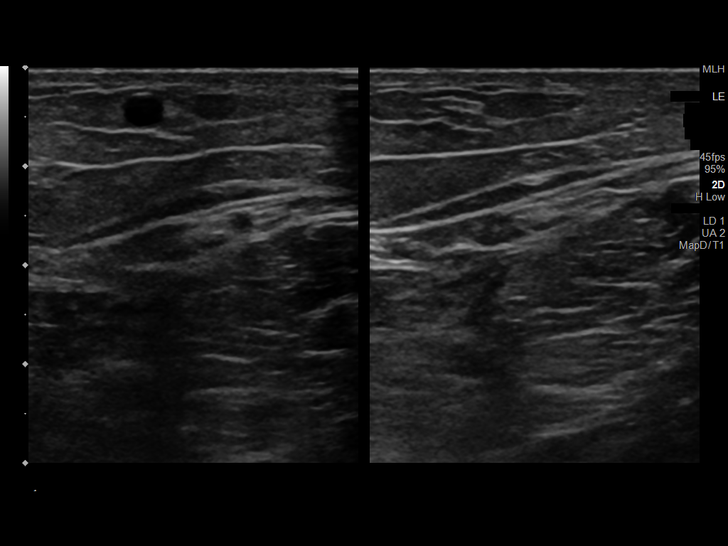

[13 of 24 positions shown; findings below may reference images not displayed]

FINDINGS: Contralateral Common Femoral Vein: Respiratory phasicity is normal
and symmetric with the symptomatic side. No evidence of thrombus.
Normal compressibility.

Common Femoral Vein: No evidence of thrombus. Normal
compressibility, respiratory phasicity and response to augmentation.

Saphenofemoral Junction: No evidence of thrombus. Normal
compressibility and flow on color Doppler imaging.

Profunda Femoral Vein: No evidence of thrombus. Normal
compressibility and flow on color Doppler imaging.

Femoral Vein: No evidence of thrombus. Normal compressibility,
respiratory phasicity and response to augmentation.

Popliteal Vein: No evidence of thrombus. Normal compressibility,
respiratory phasicity and response to augmentation.

Calf Veins: No evidence of thrombus. Normal compressibility and flow
on color Doppler imaging.

Superficial Great Saphenous Vein: No evidence of thrombus. Normal
compressibility.

Venous Reflux:  None.

Other Findings:  None.
IMPRESSION: No evidence of deep venous thrombosis.

## 2020-09-07 ENCOUNTER — Other Ambulatory Visit: Payer: Self-pay | Admitting: Cardiovascular Disease

## 2020-09-19 ENCOUNTER — Ambulatory Visit (INDEPENDENT_AMBULATORY_CARE_PROVIDER_SITE_OTHER): Payer: Medicare Other

## 2020-09-19 ENCOUNTER — Ambulatory Visit (INDEPENDENT_AMBULATORY_CARE_PROVIDER_SITE_OTHER): Payer: Medicare Other | Admitting: Orthopaedic Surgery

## 2020-09-19 ENCOUNTER — Encounter: Payer: Self-pay | Admitting: Orthopaedic Surgery

## 2020-09-19 DIAGNOSIS — Z96651 Presence of right artificial knee joint: Secondary | ICD-10-CM | POA: Diagnosis not present

## 2020-09-19 DIAGNOSIS — M25561 Pain in right knee: Secondary | ICD-10-CM

## 2020-09-19 MED ORDER — TRAMADOL HCL 50 MG PO TABS: 100.0000 mg | ORAL_TABLET | Freq: Two times a day (BID) | ORAL | 0 refills | Status: DC | PRN

## 2020-09-19 NOTE — Progress Notes (Signed)
Office Visit Note   Patient: Samuel Chambers           Date of Birth: 02/17/1951           MRN: [id number] Visit Date: 09/19/2020              Requested by: Horald Pollen, MD [place],  [place] [address] PCP: Horald Pollen, MD   Assessment & Plan: Visit Diagnoses:  1. Acute pain of right knee   2. History of total right knee replacement     Plan: I did give him reassurance that his knee looks great in terms of the knee replacement and he is doing well with getting eventually his strength back and range of motion back.  I will send in some tramadol for his pain.  All question concerns were answered and addressed.  I will see him back in 4 months to see how he is doing overall because that will be the 1 year standpoint from his right total knee arthroplasty.  No x-rays are needed at that visit.  Follow-Up Instructions: Return in about 4 months (around 01/17/2021).   Orders:  Orders Placed This Encounter  Procedures  . XR Knee 1-2 Views Right   Meds ordered this encounter  Medications  . traMADol (ULTRAM) 50 MG tablet    Sig: Take 2 tablets (100 mg total) by mouth 2 (two) times daily as needed.    Dispense:  30 tablet    Refill:  0      Procedures: No procedures performed   Clinical Data: No additional findings.   Subjective: Chief Complaint  Patient presents with  . Right Hip - Follow-up  . Right Knee - Pain  The patient is now 8 months status post a right total knee arthroplasty.  He is also had knee replacement on his left knee and a hip replacement.  These were all done by one of my colleagues in town.  Unfortunately postoperatively he was not satisfied with some of the care that he was getting in terms of the needed extra therapy for his knee and so he came to me.  I did send in for outpatient therapy and he is done well.  He injured his right knee yesterday when he slipped into a lake when he was getting out of his bass boat onto the  dock.  He says he has been hurting a little bit for him and he feels like the knee gave out and there is some weakness to it.  He has had no other acute changes in medical status.  HPI  Review of Systems He currently denies any headache, chest pain, shortness of breath, fever, chills, nausea, vomiting  Objective: Vital Signs: There were no vitals taken for this visit.  Physical Exam He is alert and oriented x3 and in no acute distress Ortho Exam Examination of his right knee shows there is some slight warmth but no redness.  His effusion from postop swelling has gone down significantly.  His range of motion is almost entirely full and the knee does feel ligamentously stable. Specialty Comments:  No specialty comments available.  Imaging: XR Knee 1-2 Views Right  Result Date: 09/19/2020 2 views of the right knee show a well-seated total knee arthroplasty with no complicating features.    PMFS History: Patient Active Problem List   Diagnosis Date Noted  . Primary osteoarthritis of right knee 01/24/2020  . OA (osteoarthritis) of hip 03/16/2018  . Elevated  hemoglobin A1c 03/14/2018  . Severe obesity (BMI 35.0-35.9 with comorbidity) ([place]) 08/04/2014  . Hyperlipidemia with target LDL less than 70; statin intolerant 04/29/2013  . CAD S/P percutaneous coronary angioplasty   . S/P laparoscopic cholecystectomy July 2014 02/18/2013  . OA (osteoarthritis) of knee 09/04/2012  . Essential hypertension 02/24/2007  . GERD 02/24/2007  . DEGENERATIVE JOINT DISEASE, GENERALIZED 02/24/2007   Past Medical History:  Diagnosis Date  . Adenomatous colon polyp 03/2005  . Anxiety   . CAD S/P percutaneous coronary angioplasty 2008   PCI to circumflex with a Promus DES 2.5 mm x 8 mm  . Degenerative joint disease   . Diabetes mellitus without complication (HCC)    diet controlled  . Diverticulosis   . Fatty liver 2016   pt denies  . Fibromyalgia   . GERD (gastroesophageal reflux disease)   .  Heart murmur    Phreesia 07/29/2020  . Hyperlipidemia    statin intolerant  . Hypertension   . Internal hemorrhoids   . Obesity, Class II, BMI 35-39.9   . Pneumonia 2016  . PONV (postoperative nausea and vomiting)     Family History  Problem Relation Age of Onset  . Breast cancer Mother   . Diabetes Mother   . Prostate cancer Father   . Colon cancer Neg Hx     Past Surgical History:  Procedure Laterality Date  . CHOLECYSTECTOMY N/A 02/18/2013   Procedure: LAPAROSCOPIC CHOLECYSTECTOMY WITH INTRAOPERATIVE CHOLANGIOGRAM;  Surgeon: Pedro Earls, MD;  Location: [facility];  Service: General;  Laterality: N/A;  . COLONOSCOPY    . CORONARY ANGIOPLASTY WITH STENT PLACEMENT  04/30/2007   2.5 mm Promus stent that was to 95% Circ;had 60-70% lesion to RCA  . DOPPLER ECHOCARDIOGRAPHY  05/27/2002   [facility]normal EF 55-66%,  . FOOT SURGERY Right    x2 fascia  . HEEL SPUR SURGERY Right   . HEMORRHOID SURGERY    . Holter Monitor  04/07/2007   sinus tachy.;  . JOINT REPLACEMENT N/A    Phreesia 07/29/2020  . KNEE ARTHROSCOPY  03/27/2012   Procedure: ARTHROSCOPY KNEE;  Surgeon: Gearlean Alf, MD;  Location: [facility];  Service: Orthopedics;  Laterality: Left;  . NM MYOCAR PERF WALL MOTION  12/19/2011   EXERCISED FOR 8-1/2 MINUTES RECHING 10 METABOLIC EQUIVALENTS. NO EVIDENCE  OF ISCHEMIA OR INFARCTION . EF 71%  . renal dopplers  04/08/2007   relatively normal  . TOTAL HIP ARTHROPLASTY Right 03/16/2018   Procedure: RIGHT TOTAL HIP ARTHROPLASTY ANTERIOR APPROACH;  Surgeon: Gaynelle Arabian, MD;  Location: [facility];  Service: Orthopedics;  Laterality: Right;  . TOTAL HIP ARTHROPLASTY Left about 10 to 12 years ago  . TOTAL KNEE ARTHROPLASTY Left 09/04/2012   Procedure: TOTAL KNEE ARTHROPLASTY;  Surgeon: Gearlean Alf, MD;  Location: [facility];  Service: Orthopedics;  Laterality: Left;  . TOTAL KNEE ARTHROPLASTY Right 01/24/2020   Procedure: TOTAL KNEE ARTHROPLASTY;  Surgeon: Gaynelle Arabian, MD;   Location: [facility];  Service: Orthopedics;  Laterality: Right;  68min   Social History   Occupational History    Employer: [employer]  Tobacco Use  . Smoking status: Never Smoker  . Smokeless tobacco: Never Used  Vaping Use  . Vaping Use: Never used  Substance and Sexual Activity  . Alcohol use: No  . Drug use: No  . Sexual activity: Not on file

## 2020-10-07 ENCOUNTER — Encounter: Payer: Self-pay | Admitting: Emergency Medicine

## 2020-10-10 NOTE — Telephone Encounter (Signed)
Continue medication.  I do not have any other recommendations as I do not know his case very well.  Thanks.

## 2020-10-10 NOTE — Telephone Encounter (Signed)
Pt was started on this 10 years ago to see if it helped fibromyalgia. Pt reports unsure if it does anything. Would be willing to stop and try something else if that is recommended. Provider who was rx has stopped practicing.

## 2020-10-10 NOTE — Telephone Encounter (Signed)
Who started him on this medication and for what reason?  How long has he been on duloxetine?  Thanks.

## 2020-10-16 ENCOUNTER — Other Ambulatory Visit: Payer: Self-pay

## 2020-10-16 ENCOUNTER — Ambulatory Visit (INDEPENDENT_AMBULATORY_CARE_PROVIDER_SITE_OTHER): Payer: Medicare Other

## 2020-10-16 ENCOUNTER — Ambulatory Visit (HOSPITAL_COMMUNITY)
Admission: EM | Admit: 2020-10-16 | Discharge: 2020-10-16 | Disposition: A | Discharge status: Home / Self Care | Payer: Medicare Other | Attending: Emergency Medicine | Admitting: Emergency Medicine

## 2020-10-16 ENCOUNTER — Encounter (HOSPITAL_COMMUNITY): Payer: Self-pay

## 2020-10-16 DIAGNOSIS — R053 Chronic cough: Secondary | ICD-10-CM | POA: Insufficient documentation

## 2020-10-16 DIAGNOSIS — R030 Elevated blood-pressure reading, without diagnosis of hypertension: Secondary | ICD-10-CM

## 2020-10-16 DIAGNOSIS — Z7982 Long term (current) use of aspirin: Secondary | ICD-10-CM | POA: Diagnosis not present

## 2020-10-16 DIAGNOSIS — Z79899 Other long term (current) drug therapy: Secondary | ICD-10-CM | POA: Diagnosis not present

## 2020-10-16 DIAGNOSIS — E785 Hyperlipidemia, unspecified: Secondary | ICD-10-CM | POA: Diagnosis not present

## 2020-10-16 DIAGNOSIS — I10 Essential (primary) hypertension: Secondary | ICD-10-CM | POA: Diagnosis not present

## 2020-10-16 DIAGNOSIS — R0602 Shortness of breath: Secondary | ICD-10-CM

## 2020-10-16 DIAGNOSIS — K760 Fatty (change of) liver, not elsewhere classified: Secondary | ICD-10-CM | POA: Diagnosis not present

## 2020-10-16 DIAGNOSIS — F419 Anxiety disorder, unspecified: Secondary | ICD-10-CM | POA: Diagnosis not present

## 2020-10-16 DIAGNOSIS — I2510 Atherosclerotic heart disease of native coronary artery without angina pectoris: Secondary | ICD-10-CM | POA: Insufficient documentation

## 2020-10-16 DIAGNOSIS — Z6835 Body mass index (BMI) 35.0-35.9, adult: Secondary | ICD-10-CM | POA: Diagnosis not present

## 2020-10-16 DIAGNOSIS — Z20822 Contact with and (suspected) exposure to covid-19: Secondary | ICD-10-CM | POA: Insufficient documentation

## 2020-10-16 DIAGNOSIS — M1990 Unspecified osteoarthritis, unspecified site: Secondary | ICD-10-CM | POA: Insufficient documentation

## 2020-10-16 DIAGNOSIS — Z96653 Presence of artificial knee joint, bilateral: Secondary | ICD-10-CM | POA: Diagnosis not present

## 2020-10-16 DIAGNOSIS — M797 Fibromyalgia: Secondary | ICD-10-CM | POA: Insufficient documentation

## 2020-10-16 DIAGNOSIS — Z955 Presence of coronary angioplasty implant and graft: Secondary | ICD-10-CM | POA: Diagnosis not present

## 2020-10-16 DIAGNOSIS — E119 Type 2 diabetes mellitus without complications: Secondary | ICD-10-CM | POA: Diagnosis not present

## 2020-10-16 DIAGNOSIS — R059 Cough, unspecified: Secondary | ICD-10-CM

## 2020-10-16 DIAGNOSIS — J209 Acute bronchitis, unspecified: Secondary | ICD-10-CM | POA: Insufficient documentation

## 2020-10-16 LAB — SARS CORONAVIRUS 2 (TAT 6-24 HRS): SARS Coronavirus 2: NEGATIVE

## 2020-10-16 MED ORDER — PREDNISONE 10 MG (21) PO TBPK
ORAL_TABLET | Freq: Every day | ORAL | 0 refills | Status: DC
Start: 2020-10-16 — End: 2021-02-07

## 2020-10-16 MED ORDER — ALBUTEROL SULFATE HFA 108 (90 BASE) MCG/ACT IN AERS
2.0000 | INHALATION_SPRAY | RESPIRATORY_TRACT | 0 refills | Status: DC | PRN
Start: 2020-10-16 — End: 2021-02-07

## 2020-10-16 MED ORDER — AZITHROMYCIN 250 MG PO TABS: 250.0000 mg | ORAL_TABLET | Freq: Every day | ORAL | 0 refills | Status: DC

## 2020-10-16 NOTE — ED Provider Notes (Signed)
[facility]    CSN: [id number] Arrival date & time: 10/16/20  3382      History   Chief Complaint Chief Complaint  Patient presents with  . Cough    HPI Samuel Chambers is a 70 y.o. male.   Patient presents with 3 to 4-week history of cough and shortness of breath.  He also reports wheezing at night.  He denies fever, chills, sore throat, vomiting, diarrhea, or other symptoms.  He was treated by his PCP 2 months ago with Augmentin and prednisone for similar symptoms; he states he improved but the symptoms then returned.  He has also been taking OTC cough medication.  His medical history includes hypertension, CAD, hyperlipidemia, diabetes, fatty liver, osteoarthritis, DJD, severe obesity, fibromyalgia, anxiety.  The history is provided by the patient and medical records.    Past Medical History:  Diagnosis Date  . Adenomatous colon polyp 03/2005  . Anxiety   . CAD S/P percutaneous coronary angioplasty 2008   PCI to circumflex with a Promus DES 2.5 mm x 8 mm  . Degenerative joint disease   . Diabetes mellitus without complication (HCC)    diet controlled  . Diverticulosis   . Fatty liver 2016   pt denies  . Fibromyalgia   . GERD (gastroesophageal reflux disease)   . Heart murmur    Phreesia 07/29/2020  . Hyperlipidemia    statin intolerant  . Hypertension   . Internal hemorrhoids   . Obesity, Class II, BMI 35-39.9   . Pneumonia 2016  . PONV (postoperative nausea and vomiting)     Patient Active Problem List   Diagnosis Date Noted  . Primary osteoarthritis of right knee 01/24/2020  . OA (osteoarthritis) of hip 03/16/2018  . Elevated hemoglobin A1c 03/14/2018  . Severe obesity (BMI 35.0-35.9 with comorbidity) ([place]) 08/04/2014  . Hyperlipidemia with target LDL less than 70; statin intolerant 04/29/2013  . CAD S/P percutaneous coronary angioplasty   . S/P laparoscopic cholecystectomy July 2014 02/18/2013  . OA (osteoarthritis) of knee 09/04/2012  .  Essential hypertension 02/24/2007  . GERD 02/24/2007  . DEGENERATIVE JOINT DISEASE, GENERALIZED 02/24/2007    Past Surgical History:  Procedure Laterality Date  . CHOLECYSTECTOMY N/A 02/18/2013   Procedure: LAPAROSCOPIC CHOLECYSTECTOMY WITH INTRAOPERATIVE CHOLANGIOGRAM;  Surgeon: Pedro Earls, MD;  Location: [facility];  Service: General;  Laterality: N/A;  . COLONOSCOPY    . CORONARY ANGIOPLASTY WITH STENT PLACEMENT  04/30/2007   2.5 mm Promus stent that was to 95% Circ;had 60-70% lesion to RCA  . DOPPLER ECHOCARDIOGRAPHY  05/27/2002   [facility]normal EF 55-66%,  . FOOT SURGERY Right    x2 fascia  . HEEL SPUR SURGERY Right   . HEMORRHOID SURGERY    . Holter Monitor  04/07/2007   sinus tachy.;  . JOINT REPLACEMENT N/A    Phreesia 07/29/2020  . KNEE ARTHROSCOPY  03/27/2012   Procedure: ARTHROSCOPY KNEE;  Surgeon: Gearlean Alf, MD;  Location: [facility];  Service: Orthopedics;  Laterality: Left;  . NM MYOCAR PERF WALL MOTION  12/19/2011   EXERCISED FOR 8-1/2 MINUTES RECHING 10 METABOLIC EQUIVALENTS. NO EVIDENCE  OF ISCHEMIA OR INFARCTION . EF 71%  . renal dopplers  04/08/2007   relatively normal  . TOTAL HIP ARTHROPLASTY Right 03/16/2018   Procedure: RIGHT TOTAL HIP ARTHROPLASTY ANTERIOR APPROACH;  Surgeon: Gaynelle Arabian, MD;  Location: [facility];  Service: Orthopedics;  Laterality: Right;  . TOTAL HIP ARTHROPLASTY Left about 10 to 12 years ago  .  TOTAL KNEE ARTHROPLASTY Left 09/04/2012   Procedure: TOTAL KNEE ARTHROPLASTY;  Surgeon: Gearlean Alf, MD;  [facility];  Service: Orthopedics;  Laterality: Left;  . TOTAL KNEE ARTHROPLASTY Right 01/24/2020   Procedure: TOTAL KNEE ARTHROPLASTY;  Surgeon: Gaynelle Arabian, MD;  Location[facility];  Service: Orthopedics;  Laterality: Right;  39min       Home Medications    Prior to Admission medications   Medication Sig Start Date End Date Taking? Authorizing Provider  albuterol (VENTOLIN HFA) 108 (90 Base) MCG/ACT inhaler Inhale 2 puffs  into the lungs every 4 (four) hours as needed for wheezing or shortness of breath. 10/16/20  Yes Sharion Balloon, NP  azithromycin (ZITHROMAX) 250 MG tablet Take 1 tablet (250 mg total) by mouth daily. Take first 2 tablets together, then 1 every day until finished. 10/16/20  Yes Sharion Balloon, NP  predniSONE (STERAPRED UNI-PAK 21 TAB) 10 MG (21) TBPK tablet Take by mouth daily. As directed 10/16/20  Yes Sharion Balloon, NP  aspirin EC 81 MG tablet Take 1 tablet (81 mg total) by mouth daily. Swallow whole. 06/13/20   Croitoru, Mihai, MD  DULoxetine (CYMBALTA) 60 MG capsule TAKE 1 CAPSULE(60 MG) BY MOUTH DAILY 04/21/20   Horald Pollen, MD  hydrochlorothiazide (HYDRODIURIL) 25 MG tablet TAKE 1 TABLET(25 MG) BY MOUTH DAILY 08/04/20   Croitoru, Mihai, MD  losartan (COZAAR) 100 MG tablet TAKE 1 TABLET(100 MG) BY MOUTH DAILY 09/07/20   Croitoru, Mihai, MD  metoprolol succinate (TOPROL-XL) 50 MG 24 hr tablet TAKE 1 TABLET BY MOUTH DAILY. TAKE WITH OR IMMEDIATELY FOLLOWING A MEAL 04/20/20   Croitoru, Mihai, MD  traMADol (ULTRAM) 50 MG tablet Take 2 tablets (100 mg total) by mouth 2 (two) times daily as needed. 09/19/20   Mcarthur Rossetti, MD    Family History Family History  Problem Relation Age of Onset  . Breast cancer Mother   . Diabetes Mother   . Prostate cancer Father   . Colon cancer Neg Hx     Social History Social History   Tobacco Use  . Smoking status: Never Smoker  . Smokeless tobacco: Never Used  Vaping Use  . Vaping Use: Never used  Substance Use Topics  . Alcohol use: No  . Drug use: No     Allergies   Crestor [rosuvastatin], Valsartan, and Warfarin and related   Review of Systems Review of Systems  Constitutional: Negative for chills and fever.  HENT: Negative for ear pain and sore throat.   Eyes: Negative for pain and visual disturbance.  Respiratory: Positive for cough, shortness of breath and wheezing.   Cardiovascular: Negative for chest pain and palpitations.   Gastrointestinal: Negative for abdominal pain, diarrhea and vomiting.  Genitourinary: Negative for dysuria and hematuria.  Musculoskeletal: Negative for arthralgias and back pain.  Skin: Negative for color change and rash.  Neurological: Negative for seizures and syncope.  All other systems reviewed and are negative.    Physical Exam Triage Vital Signs ED Triage Vitals  Enc Vitals Group     BP      Pulse      Resp      Temp      Temp src      SpO2      Weight      Height      Head Circumference      Peak Flow      Pain Score      Pain Loc  Pain Edu?      Excl. in [place]?    No data found.  Updated Vital Signs BP (!) 150/98   Pulse (!) 102   Temp 98.1 F (36.7 C)   Resp 19   SpO2 97%   Visual Acuity Right Eye Distance:   Left Eye Distance:   Bilateral Distance:    Right Eye Near:   Left Eye Near:    Bilateral Near:     Physical Exam Vitals and nursing note reviewed.  Constitutional:      General: He is not in acute distress.    Appearance: He is well-developed. He is obese.  HENT:     Head: Normocephalic and atraumatic.     Right Ear: Tympanic membrane normal.     Left Ear: Tympanic membrane normal.     Nose: Nose normal.     Mouth/Throat:     Mouth: Mucous membranes are moist.     Pharynx: Oropharynx is clear.  Eyes:     Conjunctiva/sclera: Conjunctivae normal.  Cardiovascular:     Rate and Rhythm: Normal rate and regular rhythm.     Heart sounds: Normal heart sounds.  Pulmonary:     Effort: Pulmonary effort is normal. No respiratory distress.     Breath sounds: Normal breath sounds. No wheezing or rhonchi.  Abdominal:     Palpations: Abdomen is soft.     Tenderness: There is no abdominal tenderness. There is no guarding or rebound.  Musculoskeletal:     Cervical back: Neck supple.  Skin:    General: Skin is warm and dry.  Neurological:     General: No focal deficit present.     Mental Status: He is alert and oriented to person, place,  and time.     Gait: Gait normal.  Psychiatric:        Mood and Affect: Mood normal.        Behavior: Behavior normal.      UC Treatments / Results  Labs (all labs ordered are listed, but only abnormal results are displayed) Labs Reviewed  SARS CORONAVIRUS 2 (TAT 6-24 HRS)    EKG   Radiology DG Chest 2 View  Result Date: 10/16/2020 CLINICAL DATA:  Cough and shortness of breath. EXAM: CHEST - 2 VIEW COMPARISON:  03/04/2019 FINDINGS: Midline trachea. Normal heart size. Tortuous thoracic aorta. No pleural effusion or pneumothorax. Clear lungs. IMPRESSION: No acute cardiopulmonary disease. Electronically Signed   By: Abigail Miyamoto M.D.   On: 10/16/2020 10:37    Procedures Procedures (including critical care time)  Medications Ordered in UC Medications - No data to display  Initial Impression / Assessment and Plan / UC Course  I have reviewed the triage vital signs and the nursing notes.  Pertinent labs & imaging results that were available during my care of the patient were reviewed by me and considered in my medical decision making (see chart for details).   Acute bronchitis.  Elevated blood pressure reading.  Chest x-ray negative.  Treating with albuterol inhaler, prednisone, Zithromax.  Instructed patient to follow-up with his PCP if his symptoms or not improving.  Discussed that his blood pressure is elevated today and needs to be rechecked by his PCP in 2 to 4 weeks.  He agrees to plan of care.   Final Clinical Impressions(s) / UC Diagnoses   Final diagnoses:  Acute bronchitis, unspecified organism  Elevated blood pressure reading     Discharge Instructions     Take the Zithromax and  prednisone as directed.  Use the albuterol inhaler as directed.  Follow up with your primary care provider if your symptoms are not improving.    Your blood pressure is elevated today at 150/98.  Please have this rechecked by your primary care provider in 2-4 weeks.         ED  Prescriptions    Medication Sig Dispense Auth. Provider   albuterol (VENTOLIN HFA) 108 (90 Base) MCG/ACT inhaler Inhale 2 puffs into the lungs every 4 (four) hours as needed for wheezing or shortness of breath. 18 g Sharion Balloon, NP   predniSONE (STERAPRED UNI-PAK 21 TAB) 10 MG (21) TBPK tablet Take by mouth daily. As directed 21 tablet Sharion Balloon, NP   azithromycin (ZITHROMAX) 250 MG tablet Take 1 tablet (250 mg total) by mouth daily. Take first 2 tablets together, then 1 every day until finished. 6 tablet Sharion Balloon, NP     PDMP not reviewed this encounter.   Sharion Balloon, NP 10/16/20 1048

## 2020-10-16 NOTE — Discharge Instructions (Signed)
Take the Zithromax and prednisone as directed.  Use the albuterol inhaler as directed.  Follow up with your primary care provider if your symptoms are not improving.    Your blood pressure is elevated today at 150/98.  Please have this rechecked by your primary care provider in 2-4 weeks.

## 2020-10-16 NOTE — ED Triage Notes (Signed)
Pt in with c/o cough and sob that has been going on for a few weeks now  Pt has taking OTC cough medicine and has been prescribed antibiotics by his pcp with no relief

## 2021-01-11 ENCOUNTER — Other Ambulatory Visit: Payer: Self-pay | Admitting: Cardiovascular Disease

## 2021-01-21 ENCOUNTER — Other Ambulatory Visit: Payer: Self-pay | Admitting: Cardiovascular Disease

## 2021-01-23 ENCOUNTER — Encounter: Payer: Self-pay | Admitting: Orthopaedic Surgery

## 2021-01-23 ENCOUNTER — Ambulatory Visit (INDEPENDENT_AMBULATORY_CARE_PROVIDER_SITE_OTHER): Payer: Medicare Other | Admitting: Orthopaedic Surgery

## 2021-01-23 DIAGNOSIS — Z96651 Presence of right artificial knee joint: Secondary | ICD-10-CM | POA: Diagnosis not present

## 2021-01-23 NOTE — Progress Notes (Signed)
The patient is now a year out from a right total knee arthroplasty done by one of my colleagues in town.  He ended up following up with me later in the postop.  Because of the limitations of therapy that he was being allowed under the bundle system.  He has had a lot more therapy with his left total knee arthroplasty and had a really good outcome.  He was struggling with his right knee and he stated that he was told that he was done with therapy but he felt like he needed more.  We set him up for outpatient therapy and I feels like he is had a great outcome.  He is lost 48 pounds over this process.  He reports good range of motion of his right knee as well.  We did x-rays a few months ago so we do not need x-ray today.  Examination of his right knee shows almost full range of motion.  There is no swelling of the knee.  Feels ligamentously stable.  At this point follow-up with me can be as needed.  If there is any significant issues with the knee I am happy to see him but he can also follow-up with his primary orthopedic surgeon if needed.  All questions and concerns were answered and addressed.

## 2021-01-24 ENCOUNTER — Telehealth: Payer: Self-pay | Admitting: Emergency Medicine

## 2021-01-24 DIAGNOSIS — F411 Generalized anxiety disorder: Secondary | ICD-10-CM

## 2021-01-24 NOTE — Telephone Encounter (Signed)
1.Medication Requested: DULoxetine (CYMBALTA) 60 MG capsule  2. Pharmacy (Name, Street, [place]): [id number] - [place], [place] - [address]  3. On Med List: Yes  4. Last Visit with PCP:  5. Next visit date with PCP:   Agent: Please be advised that RX refills may take up to 3 business days. We ask that you follow-up with your pharmacy.   Patient stated he is completely out of medication.

## 2021-01-26 MED ORDER — DULOXETINE HCL 60 MG PO CPEP
ORAL_CAPSULE | ORAL | 0 refills | Status: DC
Start: 2021-01-26 — End: 2021-02-07

## 2021-01-26 NOTE — Telephone Encounter (Signed)
Pt notified that short supply was sent to his pharmacy on file & to keep appt on 7/11 with PCP.  Pt thankful & verb understanding.

## 2021-01-26 NOTE — Addendum Note (Signed)
Addended by: Durwin Nora on: 01/26/2021 11:38 AM   Modules accepted: Orders

## 2021-01-26 NOTE — Telephone Encounter (Signed)
Pt needs an OV for refills. Send [employer].

## 2021-01-26 NOTE — Telephone Encounter (Signed)
1.Medication Requested: DULoxetine (CYMBALTA) 60 MG capsule   2. Pharmacy (Name, Street, [place]): [id number] - [place], [place] - [address]   3. On Med List: Yes  4. Last Visit with PCP:   5. Next visit date with PCP:     Agent: Please be advised that RX refills may take up to 3 business days. We ask that you follow-up with your pharmacy.     Patient stated he is completely out of medication.

## 2021-01-26 NOTE — Telephone Encounter (Signed)
15-day supply sent to pharmacy

## 2021-01-26 NOTE — Telephone Encounter (Signed)
    Patient called back angry, he is upset appointment required in order to get a refill. He is upset because he has missed 2 doses, no medication remaining. Concerned about stopping medication " cold [place]" Patient states "if he dies, his wife knows who to sue"  Appointment made for first available 7/11 Can short supply be sent?

## 2021-01-26 NOTE — Addendum Note (Signed)
Addended by: Binnie Rail on: 01/26/2021 04:39 PM   Modules accepted: Orders

## 2021-02-02 ENCOUNTER — Other Ambulatory Visit: Payer: Self-pay | Admitting: Internal Medicine

## 2021-02-02 DIAGNOSIS — F411 Generalized anxiety disorder: Secondary | ICD-10-CM

## 2021-02-05 ENCOUNTER — Ambulatory Visit: Payer: Medicare Other | Admitting: Emergency Medicine

## 2021-02-05 ENCOUNTER — Encounter: Payer: Self-pay | Admitting: Emergency Medicine

## 2021-02-07 ENCOUNTER — Encounter: Payer: Self-pay | Admitting: Emergency Medicine

## 2021-02-07 ENCOUNTER — Ambulatory Visit (INDEPENDENT_AMBULATORY_CARE_PROVIDER_SITE_OTHER): Payer: Medicare Other | Admitting: Emergency Medicine

## 2021-02-07 ENCOUNTER — Other Ambulatory Visit: Payer: Self-pay

## 2021-02-07 VITALS — BP 128/76 | HR 64 | Ht 72.0 in | Wt 220.0 lb

## 2021-02-07 DIAGNOSIS — M8949 Other hypertrophic osteoarthropathy, multiple sites: Secondary | ICD-10-CM | POA: Diagnosis not present

## 2021-02-07 DIAGNOSIS — Z8739 Personal history of other diseases of the musculoskeletal system and connective tissue: Secondary | ICD-10-CM | POA: Diagnosis not present

## 2021-02-07 DIAGNOSIS — M159 Polyosteoarthritis, unspecified: Secondary | ICD-10-CM

## 2021-02-07 DIAGNOSIS — I10 Essential (primary) hypertension: Secondary | ICD-10-CM | POA: Diagnosis not present

## 2021-02-07 DIAGNOSIS — F411 Generalized anxiety disorder: Secondary | ICD-10-CM

## 2021-02-07 MED ORDER — MELOXICAM 7.5 MG PO TABS: 7.5000 mg | ORAL_TABLET | Freq: Every day | ORAL | 1 refills | Status: DC

## 2021-02-07 MED ORDER — DULOXETINE HCL 60 MG PO CPEP: 60.0000 mg | ORAL_CAPSULE | Freq: Every day | ORAL | 3 refills | Status: DC

## 2021-02-07 NOTE — Assessment & Plan Note (Signed)
Stable.  On Cymbalta 60 mg daily.

## 2021-02-07 NOTE — Patient Instructions (Signed)

## 2021-02-07 NOTE — Assessment & Plan Note (Addendum)
Well-controlled hypertension.  Continue hydrochlorothiazide 25 mg daily and losartan 100 mg daily and metoprolol succinate 50 mg daily. Significant intentional loss of weight.  Feels a lot better.  Eating much better and exercising more.

## 2021-02-07 NOTE — Progress Notes (Signed)
Samuel Chambers 70 y.o.   Chief Complaint  Patient presents with   Medication Refill    Cymbalta. Pt states that at times he gets a gout flare up in his knee, he states that he would like a refill of meloxicam.    HISTORY OF PRESENT ILLNESS: This is a 70 y.o. male here for medication refill.  Takes Cymbalta 60 mg daily. Also has history of gout.  Requesting refill on meloxicam medication for flareups. No other complaints or medical concerns today.  Medication Refill Pertinent negatives include no abdominal pain, chest pain, chills, congestion, coughing, fever, headaches, nausea, rash, sore throat or vomiting.    Prior to Admission medications   Medication Sig Start Date End Date Taking? Authorizing Provider  aspirin EC 81 MG tablet Take 1 tablet (81 mg total) by mouth daily. Swallow whole. 06/13/20  Yes Croitoru, Mihai, MD  DULoxetine (CYMBALTA) 60 MG capsule TAKE 1 CAPSULE(60 MG) BY MOUTH DAILY 01/26/21  Yes Burns, Claudina Lick, MD  hydrochlorothiazide (HYDRODIURIL) 25 MG tablet TAKE 1 TABLET(25 MG) BY MOUTH DAILY 08/04/20  Yes Croitoru, Mihai, MD  losartan (COZAAR) 100 MG tablet TAKE 1 TABLET BY MOUTH EVERY DAY 01/23/21  Yes Croitoru, Mihai, MD  metoprolol succinate (TOPROL-XL) 50 MG 24 hr tablet TAKE 1 TABLET BY MOUTH DAILY WITH OR IMMEDIATELY FOLLOWING A MEAL 01/11/21  Yes Croitoru, Mihai, MD  albuterol (VENTOLIN HFA) 108 (90 Base) MCG/ACT inhaler Inhale 2 puffs into the lungs every 4 (four) hours as needed for wheezing or shortness of breath. 10/16/20   Sharion Balloon, NP  azithromycin (ZITHROMAX) 250 MG tablet Take 1 tablet (250 mg total) by mouth daily. Take first 2 tablets together, then 1 every day until finished. 10/16/20   Sharion Balloon, NP  predniSONE (STERAPRED UNI-PAK 21 TAB) 10 MG (21) TBPK tablet Take by mouth daily. As directed 10/16/20   Sharion Balloon, NP  traMADol (ULTRAM) 50 MG tablet Take 2 tablets (100 mg total) by mouth 2 (two) times daily as needed. 09/19/20   Mcarthur Rossetti, MD    Allergies  Allergen Reactions   Crestor [Rosuvastatin] Other (See Comments)    myalgias   Valsartan Other (See Comments)    Made patient feel tired and no energy   Warfarin And Related Other (See Comments)    Fast heart beat, went down hill, felt like he was dying    Patient Active Problem List   Diagnosis Date Noted   Primary osteoarthritis of right knee 01/24/2020   OA (osteoarthritis) of hip 03/16/2018   Elevated hemoglobin A1c 03/14/2018   Severe obesity (BMI 35.0-35.9 with comorbidity) ([place]) 08/04/2014   Hyperlipidemia with target LDL less than 70; statin intolerant 04/29/2013   CAD S/P percutaneous coronary angioplasty    S/P laparoscopic cholecystectomy July 2014 02/18/2013   OA (osteoarthritis) of knee 09/04/2012   Essential hypertension 02/24/2007   GERD 02/24/2007   DEGENERATIVE JOINT DISEASE, GENERALIZED 02/24/2007    Past Medical History:  Diagnosis Date   Adenomatous colon polyp 03/2005   Anxiety    CAD S/P percutaneous coronary angioplasty 2008   PCI to circumflex with a Promus DES 2.5 mm x 8 mm   Degenerative joint disease    Diabetes mellitus without complication ([place])    diet controlled   Diverticulosis    Fatty liver 2016   pt denies   Fibromyalgia    GERD (gastroesophageal reflux disease)    Heart murmur    Phreesia 07/29/2020   Hyperlipidemia  statin intolerant   Hypertension    Internal hemorrhoids    Obesity, Class II, BMI 35-39.9    Pneumonia 2016   PONV (postoperative nausea and vomiting)     Past Surgical History:  Procedure Laterality Date   CHOLECYSTECTOMY N/A 02/18/2013   Procedure: LAPAROSCOPIC CHOLECYSTECTOMY WITH INTRAOPERATIVE CHOLANGIOGRAM;  Surgeon: Pedro Earls, MD;  Location: [facility];  Service: General;  Laterality: N/A;   COLONOSCOPY     CORONARY ANGIOPLASTY WITH STENT PLACEMENT  04/30/2007   2.5 mm Promus stent that was to 95% Circ;had 60-70% lesion to RCA   DOPPLER ECHOCARDIOGRAPHY  05/27/2002    [facility]normal EF 55-66%,   FOOT SURGERY Right    x2 fascia   HEEL SPUR SURGERY Right    HEMORRHOID SURGERY     Holter Monitor  04/07/2007   sinus tachy.;   JOINT REPLACEMENT N/A    Phreesia 07/29/2020   KNEE ARTHROSCOPY  03/27/2012   Procedure: ARTHROSCOPY KNEE;  Surgeon: Gearlean Alf, MD;  Location: [facility];  Service: Orthopedics;  Laterality: Left;   NM MYOCAR PERF WALL MOTION  12/19/2011   EXERCISED FOR 8-1/2 MINUTES RECHING 10 METABOLIC EQUIVALENTS. NO EVIDENCE  OF ISCHEMIA OR INFARCTION . EF 71%   renal dopplers  04/08/2007   relatively normal   TOTAL HIP ARTHROPLASTY Right 03/16/2018   Procedure: RIGHT TOTAL HIP ARTHROPLASTY ANTERIOR APPROACH;  Surgeon: Gaynelle Arabian, MD;  Location: [facility];  Service: Orthopedics;  Laterality: Right;   TOTAL HIP ARTHROPLASTY Left about 10 to 12 years ago   TOTAL KNEE ARTHROPLASTY Left 09/04/2012   Procedure: TOTAL KNEE ARTHROPLASTY;  Surgeon: Gearlean Alf, MD;  Location: [facility];  Service: Orthopedics;  Laterality: Left;   TOTAL KNEE ARTHROPLASTY Right 01/24/2020   Procedure: TOTAL KNEE ARTHROPLASTY;  Surgeon: Gaynelle Arabian, MD;  Location: [facility];  Service: Orthopedics;  Laterality: Right;  59min    Social History   Socioeconomic History   Marital status: Married    Spouse name: Not on file   Number of children: 1   Years of education: Not on file   Highest education level: Not on file  Occupational History    Employer: [employer]  Tobacco Use   Smoking status: Never   Smokeless tobacco: Never  Vaping Use   Vaping Use: Never used  Substance and Sexual Activity   Alcohol use: No   Drug use: No   Sexual activity: Not on file  Other Topics Concern   Not on file  Social History Narrative   Very little 0-2 drinks a week   Social Determinants of Health   Financial Resource Strain: Not on file  Food Insecurity: Not on file  Transportation Needs: Not on file  Physical Activity: Not on file  Stress: Not on file  Social  Connections: Not on file  Intimate Partner Violence: Not on file    Family History  Problem Relation Age of Onset   Breast cancer Mother    Diabetes Mother    Prostate cancer Father    Colon cancer Neg Hx      Review of Systems  Constitutional: Negative.  Negative for chills and fever.  HENT: Negative.  Negative for congestion and sore throat.   Respiratory: Negative.  Negative for cough and shortness of breath.   Cardiovascular: Negative.  Negative for chest pain and palpitations.  Gastrointestinal:  Negative for abdominal pain, diarrhea, nausea and vomiting.  Genitourinary: Negative.  Negative for dysuria and hematuria.  Skin: Negative.  Negative for rash.  Neurological:  Negative for dizziness and headaches.  All other systems reviewed and are negative.  Today's Vitals   02/07/21 1605  BP: 128/76  Pulse: 64  SpO2: 94%  Weight: 220 lb (99.8 kg)  Height: 6' (1.829 m)   Body mass index is 29.84 kg/m. Wt Readings from Last 3 Encounters:  02/07/21 220 lb (99.8 kg)  08/01/20 275 lb (124.7 kg)  07/03/20 270 lb (122.5 kg)    Physical Exam Vitals reviewed.  Constitutional:      Appearance: Normal appearance.  HENT:     Head: Normocephalic.  Eyes:     Extraocular Movements: Extraocular movements intact.     Conjunctiva/sclera: Conjunctivae normal.     Pupils: Pupils are equal, round, and reactive to light.  Cardiovascular:     Rate and Rhythm: Normal rate and regular rhythm.     Pulses: Normal pulses.     Heart sounds: Normal heart sounds.  Pulmonary:     Effort: Pulmonary effort is normal.     Breath sounds: Normal breath sounds.  Musculoskeletal:        General: Normal range of motion.     Cervical back: Normal range of motion and neck supple.  Skin:    General: Skin is warm and dry.     Capillary Refill: Capillary refill takes less than 2 seconds.  Neurological:     General: No focal deficit present.     Mental Status: He is alert and oriented to person,  place, and time.     ASSESSMENT & PLAN: A total of 30 minutes was spent with the patient and counseling/coordination of care regarding preparing for this visit, review of most recent office visit notes, review of all medications, review of most recent blood work results, hypertension and cardiovascular risks associated with this condition, health maintenance items, prognosis, documentation, and need for follow-up.  Essential hypertension Well-controlled hypertension.  Continue hydrochlorothiazide 25 mg daily and losartan 100 mg daily and metoprolol succinate 50 mg daily. Significant intentional loss of weight.  Feels a lot better.  Eating much better and exercising more.  Osteoarthritis of multiple joints Well-controlled.  History of surgeries to both knees and hips.  Takes meloxicam as needed.  Anxiety state Stable.  On Cymbalta 60 mg daily.  Aydenn was seen today for medication refill.  Diagnoses and all orders for this visit:  Essential hypertension  Anxiety state -     DULoxetine (CYMBALTA) 60 MG capsule; Take 1 capsule (60 mg total) by mouth daily. TAKE 1 CAPSULE(60 MG) BY MOUTH DAILY  History of gout  Primary osteoarthritis involving multiple joints  Other orders -     meloxicam (MOBIC) 7.5 MG tablet; Take 1 tablet (7.5 mg total) by mouth daily.  Patient Instructions  Health Maintenance After Age 9 After age 73, you are at a higher risk for certain long-term diseases and infections as well as injuries from falls. Falls are a major cause of broken bones and head injuries in people who are older than age 78. Getting regular preventive care can help to keep you healthy and well. Preventive care includes getting regular testing and making lifestyle changes as recommended by your health care provider. Talk with your health care provider about: Which screenings and tests you should have. A screening is a test that checks for a disease when you have no symptoms. A diet and  exercise plan that is right for you. What should I know about screenings and tests to  prevent falls? Screening and testing are the best ways to find a health problem early. Early diagnosis and treatment give you the best chance of managing medical conditions that are common after age 61. Certain conditions and lifestyle choices may make you more likely to have a fall. Your health care provider may recommend: Regular vision checks. Poor vision and conditions such as cataracts can make you more likely to have a fall. If you wear glasses, make sure to get your prescription updated if your vision changes. Medicine review. Work with your health care provider to regularly review all of the medicines you are taking, including over-the-counter medicines. Ask your health care provider about any side effects that may make you more likely to have a fall. Tell your health care provider if any medicines that you take make you feel dizzy or sleepy. Osteoporosis screening. Osteoporosis is a condition that causes the bones to get weaker. This can make the bones weak and cause them to break more easily. Blood pressure screening. Blood pressure changes and medicines to control blood pressure can make you feel dizzy. Strength and balance checks. Your health care provider may recommend certain tests to check your strength and balance while standing, walking, or changing positions. Foot health exam. Foot pain and numbness, as well as not wearing proper footwear, can make you more likely to have a fall. Depression screening. You may be more likely to have a fall if you have a fear of falling, feel emotionally low, or feel unable to do activities that you used to do. Alcohol use screening. Using too much alcohol can affect your balance and may make you more likely to have a fall. What actions can I take to lower my risk of falls? General instructions Talk with your health care provider about your risks for falling. Tell your  health care provider if: You fall. Be sure to tell your health care provider about all falls, even ones that seem minor. You feel dizzy, sleepy, or off-balance. Take over-the-counter and prescription medicines only as told by your health care provider. These include any supplements. Eat a healthy diet and maintain a healthy weight. A healthy diet includes low-fat dairy products, low-fat (lean) meats, and fiber from whole grains, beans, and lots of fruits and vegetables. Home safety Remove any tripping hazards, such as rugs, cords, and clutter. Install safety equipment such as grab bars in bathrooms and safety rails on stairs. Keep rooms and walkways well-lit. Activity  Follow a regular exercise program to stay fit. This will help you maintain your balance. Ask your health care provider what types of exercise are appropriate for you. If you need a cane or walker, use it as recommended by your health care provider. Wear supportive shoes that have nonskid soles.  Lifestyle Do not drink alcohol if your health care provider tells you not to drink. If you drink alcohol, limit how much you have: 0-1 drink a day for women. 0-2 drinks a day for men. Be aware of how much alcohol is in your drink. In the [place] one drink equals one typical bottle of beer (12 oz), one-half glass of wine (5 oz), or one shot of hard liquor (1 oz). Do not use any products that contain nicotine or tobacco, such as cigarettes and e-cigarettes. If you need help quitting, ask your health care provider. Summary Having a healthy lifestyle and getting preventive care can help to protect your health and wellness after age 69. Screening and testing are the best  way to find a health problem early and help you avoid having a fall. Early diagnosis and treatment give you the best chance for managing medical conditions that are more common for people who are older than age 78. Falls are a major cause of broken bones and head injuries in  people who are older than age 13. Take precautions to prevent a fall at home. Work with your health care provider to learn what changes you can make to improve your health and wellness and to prevent falls. This information is not intended to replace advice given to you by your health care provider. Make sure you discuss any questions you have with your healthcare provider. Document Revised: 06/30/2020 Document Reviewed: 06/30/2020 Elsevier Patient Education  2022 [place], MD [facility]

## 2021-02-07 NOTE — Assessment & Plan Note (Signed)
Well-controlled.  History of surgeries to both knees and hips.  Takes meloxicam as needed.

## 2021-02-15 NOTE — Telephone Encounter (Signed)
Medication refilled 02/07/21.

## 2021-02-28 ENCOUNTER — Other Ambulatory Visit: Payer: Self-pay

## 2021-02-28 MED ORDER — METOPROLOL SUCCINATE ER 50 MG PO TB24: ORAL_TABLET | ORAL | 1 refills | Status: DC

## 2021-04-05 ENCOUNTER — Encounter: Payer: Self-pay | Admitting: Gastroenterology

## 2021-05-03 ENCOUNTER — Other Ambulatory Visit: Payer: Self-pay | Admitting: Cardiovascular Disease

## 2021-06-20 ENCOUNTER — Other Ambulatory Visit: Payer: Self-pay

## 2021-06-20 ENCOUNTER — Encounter: Payer: Self-pay | Admitting: Cardiovascular Disease

## 2021-06-20 ENCOUNTER — Ambulatory Visit (INDEPENDENT_AMBULATORY_CARE_PROVIDER_SITE_OTHER): Payer: Medicare Other | Admitting: Cardiovascular Disease

## 2021-06-20 VITALS — BP 116/62 | HR 62 | Ht 72.0 in | Wt 201.4 lb

## 2021-06-20 DIAGNOSIS — I10 Essential (primary) hypertension: Secondary | ICD-10-CM | POA: Diagnosis not present

## 2021-06-20 DIAGNOSIS — E785 Hyperlipidemia, unspecified: Secondary | ICD-10-CM

## 2021-06-20 DIAGNOSIS — I2510 Atherosclerotic heart disease of native coronary artery without angina pectoris: Secondary | ICD-10-CM | POA: Diagnosis not present

## 2021-06-20 DIAGNOSIS — R7303 Prediabetes: Secondary | ICD-10-CM | POA: Diagnosis not present

## 2021-06-20 DIAGNOSIS — I358 Other nonrheumatic aortic valve disorders: Secondary | ICD-10-CM

## 2021-06-20 DIAGNOSIS — E663 Overweight: Secondary | ICD-10-CM

## 2021-06-20 LAB — LIPID PANEL
Chol/HDL Ratio: 4 ratio (ref 0.0–5.0)
Cholesterol, Total: 177 mg/dL (ref 100–199)
HDL: 44 mg/dL (ref >39)
LDL Chol Calc (NIH): 117 mg/dL — ABNORMAL HIGH (ref 0–99)
Triglycerides: 85 mg/dL (ref 0–149)
VLDL Cholesterol Cal: 16 mg/dL (ref 5–40)

## 2021-06-20 LAB — HEMOGLOBIN A1C
Est. average glucose Bld gHb Est-mCnc: 114 mg/dL
Hgb A1c MFr Bld: 5.6 % (ref 4.8–5.6)

## 2021-06-20 LAB — COMPREHENSIVE METABOLIC PANEL
ALT: 27 IU/L (ref 0–44)
AST: 27 IU/L (ref 0–40)
Albumin/Globulin Ratio: 1.8 (ref 1.2–2.2)
Albumin: 4.2 g/dL (ref 3.8–4.8)
Alkaline Phosphatase: 74 IU/L (ref 44–121)
BUN/Creatinine Ratio: 29 — ABNORMAL HIGH (ref 10–24)
BUN: 24 mg/dL (ref 8–27)
Bilirubin Total: 0.5 mg/dL (ref 0.0–1.2)
CO2: 28 mmol/L (ref 20–29)
Calcium: 9.4 mg/dL (ref 8.6–10.2)
Chloride: 97 mmol/L (ref 96–106)
Creatinine, Ser: 0.83 mg/dL (ref 0.76–1.27)
Globulin, Total: 2.3 g/dL (ref 1.5–4.5)
Glucose: 98 mg/dL (ref 70–99)
Potassium: 4.1 mmol/L (ref 3.5–5.2)
Sodium: 138 mmol/L (ref 134–144)
Total Protein: 6.5 g/dL (ref 6.0–8.5)
eGFR: 94 mL/min/{1.73_m2} (ref >59)

## 2021-06-20 MED ORDER — HYDROCHLOROTHIAZIDE 12.5 MG PO TABS: 12.5000 mg | ORAL_TABLET | Freq: Every day | ORAL | 3 refills | Status: DC

## 2021-06-20 NOTE — Progress Notes (Signed)
Cardiology Office Note   Date:  06/22/2021   ID:  Marguerite Jarboe, DOB [birth year], MRN [id number]  PCP:  Horald Pollen, MD  Cardiologist:  Sanda Klein, MD  Electrophysiologist:  None   Evaluation Performed:  Follow-Up Visit  Chief Complaint:  CAD  History of Present Illness:    Dalvin Clipper is a 70 y.o. male with history of coronary artery disease, diet-controlled diabetes mellitus, hypercholesterolemia, essential hypertension, fibromyalgia.  [name] has made important changes in his lifestyle.  He is eating a healthy diet and exercises regularly.  He tells me that he does 1000 abdominal crunches every day.  He has lost 75 pounds.  He says that he feels like he is "70 years old".  He has essentially cured his diabetes with a hemoglobin A1c of 5.6% off medications.  There's also been a modest improvement in his HDL which is now 44.  Unfortunately his LDL cholesterol remains relatively high at 117 (target less than 70).  He has been intolerant to numerous statins, even when administered in intermittent dosing, due to myalgia he has previously undergone right hip replacement and a relatively recent right knee replacement in June 2021 and has significant lumbar spinal disease.  The patient specifically denies any chest pain at rest exertion, dyspnea at rest or with exertion, orthopnea, paroxysmal nocturnal dyspnea, syncope, palpitations, focal neurological deficits, intermittent claudication, lower extremity edema, unexplained weight gain, cough, hemoptysis or wheezing.  He rarely has gout attacks which she treats with meloxicam prn.   He tells me that he has tried "every single statin there is" (my records show that he has failed atorvastatin, simvastatin, rosuvastatin, but I do not see pravastatin listed).  They all made his muscles ache so badly that he could not move.  Zetia caused similar problems.  He tried taking cholestyramine but this did not help his lipid profile  and also caused side effects.  He was prescribed Repatha but even after applying for assistance was unable to afford its cost.  (He would have had to pay $390 a month).  I offered referral to the lipid clinic or enrollment in a clinical trial, but he is not interested.  In general he shows a fairly firm mistrust for scientific recommendations.   Past Medical History:  Diagnosis Date   Adenomatous colon polyp 03/2005   Anxiety    CAD S/P percutaneous coronary angioplasty 2008   PCI to circumflex with a Promus DES 2.5 mm x 8 mm   Degenerative joint disease    Diabetes mellitus without complication ([place])    diet controlled   Diverticulosis    Fatty liver 2016   pt denies   Fibromyalgia    GERD (gastroesophageal reflux disease)    Heart murmur    Phreesia 07/29/2020   Hyperlipidemia    statin intolerant   Hypertension    Internal hemorrhoids    Obesity, Class II, BMI 35-39.9    Pneumonia 2016   PONV (postoperative nausea and vomiting)    Past Surgical History:  Procedure Laterality Date   CHOLECYSTECTOMY N/A 02/18/2013   Procedure: LAPAROSCOPIC CHOLECYSTECTOMY WITH INTRAOPERATIVE CHOLANGIOGRAM;  Surgeon: Pedro Earls, MD;  Location: [facility];  Service: General;  Laterality: N/A;   COLONOSCOPY     CORONARY ANGIOPLASTY WITH STENT PLACEMENT  04/30/2007   2.5 mm Promus stent that was to 95% Circ;had 60-70% lesion to RCA   DOPPLER ECHOCARDIOGRAPHY  05/27/2002   [facility]normal EF 55-66%,   FOOT SURGERY Right  x2 fascia   HEEL SPUR SURGERY Right    HEMORRHOID SURGERY     Holter Monitor  04/07/2007   sinus tachy.;   JOINT REPLACEMENT N/A    Phreesia 07/29/2020   KNEE ARTHROSCOPY  03/27/2012   Procedure: ARTHROSCOPY KNEE;  Surgeon: Gearlean Alf, MD;  Location: [facility];  Service: Orthopedics;  Laterality: Left;   NM MYOCAR PERF WALL MOTION  12/19/2011   EXERCISED FOR 8-1/2 MINUTES RECHING 10 METABOLIC EQUIVALENTS. NO EVIDENCE  OF ISCHEMIA OR INFARCTION . EF 71%   renal dopplers   04/08/2007   relatively normal   TOTAL HIP ARTHROPLASTY Right 03/16/2018   Procedure: RIGHT TOTAL HIP ARTHROPLASTY ANTERIOR APPROACH;  Surgeon: Gaynelle Arabian, MD;  [facility];  Service: Orthopedics;  Laterality: Right;   TOTAL HIP ARTHROPLASTY Left about 10 to 12 years ago   TOTAL KNEE ARTHROPLASTY Left 09/04/2012   Procedure: TOTAL KNEE ARTHROPLASTY;  Surgeon: Gearlean Alf, MD;  [facility];  Service: Orthopedics;  Laterality: Left;   TOTAL KNEE ARTHROPLASTY Right 01/24/2020   Procedure: TOTAL KNEE ARTHROPLASTY;  Surgeon: Gaynelle Arabian, MD;  [facility];  Service: Orthopedics;  Laterality: Right;  47min     Current Meds  Medication Sig   aspirin EC 81 MG tablet Take 1 tablet (81 mg total) by mouth daily. Swallow whole.   DULoxetine (CYMBALTA) 60 MG capsule Take 1 capsule (60 mg total) by mouth daily. TAKE 1 CAPSULE(60 MG) BY MOUTH DAILY   losartan (COZAAR) 100 MG tablet TAKE 1 TABLET BY MOUTH EVERY DAY   meloxicam (MOBIC) 7.5 MG tablet Take 1 tablet (7.5 mg total) by mouth daily.   metoprolol succinate (TOPROL-XL) 50 MG 24 hr tablet TAKE 1 TABLET BY MOUTH DAILY WITH OR IMMEDIATELY FOLLOWING A MEAL   [DISCONTINUED] hydrochlorothiazide (HYDRODIURIL) 25 MG tablet TAKE 1 TABLET BY MOUTH EVERY DAY     Allergies:   Crestor [rosuvastatin], Valsartan, and Warfarin and related   Social History   Tobacco Use   Smoking status: Never   Smokeless tobacco: Never  Vaping Use   Vaping Use: Never used  Substance Use Topics   Alcohol use: No   Drug use: No     Family Hx: The patient's family history includes Breast cancer in his mother; Diabetes in his mother; Prostate cancer in his father. There is no history of Colon cancer.  ROS:   Please see the history of present illness.    All other systems are reviewed and are negative.   Prior CV studies:   The following studies were reviewed today  Labs/Other Tests and Data Reviewed:    ECHO January 2021:  1. Left  ventricular ejection fraction, by estimation, is 55 to 60%. Left  ventricular ejection fraction by 3D volume is 55 %. The left ventricle has  normal function. The left ventricle has no regional wall motion  abnormalities. Left ventricular diastolic   parameters are consistent with Grade I diastolic dysfunction (impaired  relaxation).   2. Right ventricular systolic function is normal. The right ventricular  size is normal.   3. The mitral valve is normal in structure. No evidence of mitral valve  regurgitation.   4. The aortic valve is calcified. There is mild thickening of the aortic  valve. Aortic valve regurgitation is not visualized. Mild aortic valve  sclerosis is present, with no evidence of aortic valve stenosis.   5. Aortic dilatation noted. There is mild dilatation of the aortic root,  measuring 41 mm.  There is mild dilatation of the ascending aorta,  measuring 38 mm.   6. The inferior vena cava is normal in size with greater than 50%  respiratory variability, suggesting right atrial pressure of 3 mmHg.   EKG:  Ordered today and personally reviewed, shows normal sinus rhythm and left axis deviation due to possible old inferior MI, but almost meets criteria for left anterior fascicular block, no repolarization normalities, QTC 418 ms  Recent Labs: 06/20/2021: ALT 27; BUN 24; Creatinine, Ser 0.83; Potassium 4.1; Sodium 138   Recent Lipid Panel Lab Results  Component Value Date/Time   CHOL 177 06/20/2021 08:41 AM   TRIG 85 06/20/2021 08:41 AM   HDL 44 06/20/2021 08:41 AM   CHOLHDL 4.0 06/20/2021 08:41 AM   CHOLHDL 4.9 09/21/2015 02:10 PM   LDLCALC 117 (H) 06/20/2021 08:41 AM   Lipid Panel     Component Value Date/Time   CHOL 177 06/20/2021 0841   TRIG 85 06/20/2021 0841   HDL 44 06/20/2021 0841   CHOLHDL 4.0 06/20/2021 0841   CHOLHDL 4.9 09/21/2015 1410   VLDL 38 (H) 09/21/2015 1410   LDLCALC 117 (H) 06/20/2021 0841   LABVLDL 16 06/20/2021 0841     Wt Readings  from Last 3 Encounters:  06/20/21 201 lb 6.4 oz (91.4 kg)  02/07/21 220 lb (99.8 kg)  08/01/20 275 lb (124.7 kg)     Objective:    Vital Signs:  BP 116/62   Pulse 62   Ht 6' (1.829 m)   Wt 201 lb 6.4 oz (91.4 kg)   SpO2 99%   BMI 27.31 kg/m     General: Alert, oriented x3, no distress, mildly overweight, but appears fit Head: no evidence of trauma, PERRL, EOMI, no exophtalmos or lid lag, no myxedema, no xanthelasma; normal ears, nose and oropharynx Neck: normal jugular venous pulsations and no hepatojugular reflux; brisk carotid pulses without delay and no carotid bruits Chest: clear to auscultation, no signs of consolidation by percussion or palpation, normal fremitus, symmetrical and full respiratory excursions Cardiovascular: normal position and quality of the apical impulse, regular rhythm, normal first and second heart sounds, early peaking 2/6 systolic ejection murmur in the aortic focus, no diastolic murmurs, rubs or gallops Abdomen: no tenderness or distention, no masses by palpation, no abnormal pulsatility or arterial bruits, normal bowel sounds, no hepatosplenomegaly Extremities: no clubbing, cyanosis or edema; 2+ radial, ulnar and brachial pulses bilaterally; 2+ right femoral, posterior tibial and dorsalis pedis pulses; 2+ left femoral, posterior tibial and dorsalis pedis pulses; no subclavian or femoral bruits Neurological: grossly nonfocal Psych: Normal mood and affect   ASSESSMENT & PLAN:    1. Coronary artery disease involving native coronary artery of native heart without angina pectoris   2. Hyperlipidemia with target LDL less than 70   3. Essential hypertension   4. Pre-diabetes   5. Overweight (BMI 25.0-29.9)   6. Aortic valve sclerosis       CAD: Asymptomatic since he received a stent in the left circumflex coronary in 2008.  On beta-blocker and aspirin.   HLP: Despite his excellent efforts at improving his diet and exercise which has had significant  metabolic benefits, his LDL remains above target.  He is intolerant to numerous statins due to myopathy.  Also had side effects with ezetimibe.  Unable to afford PCSK9 inhibitors.   HTN: Excellent control, will reduce the hydrochlorothiazide dose in half and hopefully be able to stop it altogether. Gout: Attacks are infrequent, but he would  still benefit from discontinuing the thiazide diuretic. Overweight/history of prediabetes: He has done an outstanding job of losing weight and becoming much better.  He's lost a lot of weight in a short period of time, normalized his blood glucose and feels better. Aortic sclerosis: No evidence of aortic valve stenosis by echocardiogram performed earlier this year.   Patient Instructions  Medication Instructions:  DECREASE the Hydrochlorothiazide to 12.5 mg once daily  *If you need a refill on your cardiac medications before your next appointment, please call your pharmacy*   Lab Work: Your provider would like for you to have the following labs today: CMET, Lipid and A1C  If you have labs (blood work) drawn today and your tests are completely normal, you will receive your results only by: [place] (if you have MyChart) OR A paper copy in the mail If you have any lab test that is abnormal or we need to change your treatment, we will call you to review the results.   Testing/Procedures: None ordered   Follow-Up: At [facility], you and your health needs are our priority.  As part of our continuing mission to provide you with exceptional heart care, we have created designated Provider Care Teams.  These Care Teams include your primary Cardiologist (physician) and Advanced Practice Providers (APPs -  Physician Assistants and Nurse Practitioners) who all work together to provide you with the care you need, when you need it.  We recommend signing up for the patient portal called "MyChart".  Sign up information is provided on this After Visit  Summary.  MyChart is used to connect with patients for Virtual Visits (Telemedicine).  Patients are able to view lab/test results, encounter notes, upcoming appointments, etc.  Non-urgent messages can be sent to your provider as well.   To learn more about what you can do with MyChart, go to [email or web address].    Your next appointment:   12 month(s)  The format for your next appointment:   In Person  Provider:   Sanda Klein, MD      Signed, Sanda Klein, MD  06/22/2021 10:22 AM    [address]

## 2021-06-20 NOTE — Patient Instructions (Addendum)
Medication Instructions:  DECREASE the Hydrochlorothiazide to 12.5 mg once daily  *If you need a refill on your cardiac medications before your next appointment, please call your pharmacy*   Lab Work: Your provider would like for you to have the following labs today: CMET, Lipid and A1C  If you have labs (blood work) drawn today and your tests are completely normal, you will receive your results only by: [place] (if you have MyChart) OR A paper copy in the mail If you have any lab test that is abnormal or we need to change your treatment, we will call you to review the results.   Testing/Procedures: None ordered   Follow-Up: At [facility], you and your health needs are our priority.  As part of our continuing mission to provide you with exceptional heart care, we have created designated Provider Care Teams.  These Care Teams include your primary Cardiologist (physician) and Advanced Practice Providers (APPs -  Physician Assistants and Nurse Practitioners) who all work together to provide you with the care you need, when you need it.  We recommend signing up for the patient portal called "MyChart".  Sign up information is provided on this After Visit Summary.  MyChart is used to connect with patients for Virtual Visits (Telemedicine).  Patients are able to view lab/test results, encounter notes, upcoming appointments, etc.  Non-urgent messages can be sent to your provider as well.   To learn more about what you can do with MyChart, go to [email or web address].    Your next appointment:   12 month(s)  The format for your next appointment:   In Person  Provider:   Sanda Klein, MD

## 2021-06-22 ENCOUNTER — Encounter: Payer: Self-pay | Admitting: Cardiovascular Disease

## 2021-07-30 ENCOUNTER — Other Ambulatory Visit: Payer: Self-pay | Admitting: Cardiovascular Disease

## 2021-08-27 ENCOUNTER — Telehealth: Payer: Self-pay | Admitting: *Deleted

## 2021-08-27 NOTE — Telephone Encounter (Signed)
Spoke to the patient about starting Leqvio. He was advised that there was paperwork that would need to be filled out to get the process started.   The patient stated that he had been started on this process before and it was still too much due to the injections having to be done at [facility]. He stated that someone else had already spoken to him about this. He declined the [facility] and stated that he "was fine without it." He has been advised to call back if he changes his mind.

## 2021-11-03 ENCOUNTER — Other Ambulatory Visit: Payer: Self-pay | Admitting: Cardiovascular Disease

## 2021-11-09 ENCOUNTER — Ambulatory Visit: Payer: Medicare Other

## 2021-11-12 ENCOUNTER — Ambulatory Visit (INDEPENDENT_AMBULATORY_CARE_PROVIDER_SITE_OTHER): Payer: Medicare Other

## 2021-11-12 DIAGNOSIS — Z0000 Encounter for general adult medical examination without abnormal findings: Secondary | ICD-10-CM

## 2021-11-12 DIAGNOSIS — Z1211 Encounter for screening for malignant neoplasm of colon: Secondary | ICD-10-CM | POA: Diagnosis not present

## 2021-11-12 NOTE — Progress Notes (Signed)
? ?Subjective:  ? Samuel Chambers is a 71 y.o. male who presents for an Subsequent  Medicare Annual Wellness Visit. ? ?I connected with Samuel Chambers today by telephone and verified that I am speaking with the correct person using two identifiers. ?Location patient: home ?Location provider: work ?Persons participating in the virtual visit: patient, provider. ?  ?I discussed the limitations, risks, security and privacy concerns of performing an evaluation and management service by telephone and the availability of in person appointments. I also discussed with the patient that there may be a patient responsible charge related to this service. The patient expressed understanding and verbally consented to this telephonic visit.  ?  ?Interactive audio and video telecommunications were attempted between this provider and patient, however failed, due to patient having technical difficulties OR patient did not have access to video capability.  We continued and completed visit with audio only. ? ?  ?Review of Systems    ? ?Cardiac Risk Factors include: advanced age (>19mn, >>32women);male gender ? ?   ?Objective:  ?  ?Today's Vitals  ? ?There is no height or weight on file to calculate BMI. ? ? ?  11/12/2021  ?  8:19 AM 08/01/2020  ? 11:05 AM 06/05/2020  ?  8:06 AM 01/24/2020  ?  2:40 PM 01/24/2020  ?  2:00 PM 01/17/2020  ?  8:17 AM 05/10/2019  ?  8:11 AM  ?Advanced Directives  ?Does Patient Have a Medical Advance Directive? No No No No No No No  ?Would patient like information on creating a medical advance directive? No - Patient declined No - Patient declined No - Patient declined No - Patient declined No - Patient declined  No - Patient declined  ? ? ?Current Medications (verified) ?Outpatient Encounter Medications as of 11/12/2021  ?Medication Sig  ? aspirin EC 81 MG tablet Take 1 tablet (81 mg total) by mouth daily. Swallow whole.  ? hydrochlorothiazide (HYDRODIURIL) 12.5 MG tablet Take 1 tablet (12.5 mg total) by mouth  daily.  ? losartan (COZAAR) 100 MG tablet TAKE 1 TABLET BY MOUTH EVERY DAY  ? metoprolol succinate (TOPROL-XL) 50 MG 24 hr tablet TAKE 1 TABLET BY MOUTH DAILY WITH OR IMMEDIATELY FOLLOWING A MEAL  ? DULoxetine (CYMBALTA) 60 MG capsule Take 1 capsule (60 mg total) by mouth daily. TAKE 1 CAPSULE(60 MG) BY MOUTH DAILY  ? meloxicam (MOBIC) 7.5 MG tablet Take 1 tablet (7.5 mg total) by mouth daily.  ? ?No facility-administered encounter medications on file as of 11/12/2021.  ? ? ?Allergies (verified) ?Crestor [rosuvastatin], Valsartan, and Warfarin and related  ? ?History: ?Past Medical History:  ?Diagnosis Date  ? Adenomatous colon polyp 03/2005  ? Anxiety   ? CAD S/P percutaneous coronary angioplasty 2008  ? PCI to circumflex with a Promus DES 2.5 mm x 8 mm  ? Degenerative joint disease   ? [place]   ? diet controlled  ? Diverticulosis   ? Fatty liver 2016  ? pt denies  ? Fibromyalgia   ? GERD (gastroesophageal reflux disease)   ? Heart murmur   ? Phreesia 07/29/2020  ? Hyperlipidemia   ? statin intolerant  ? Hypertension   ? Internal hemorrhoids   ? Obesity, Class II, BMI 35-39.9   ? Pneumonia 2016  ? PONV (postoperative nausea and vomiting)   ? ?Past Surgical History:  ?Procedure Laterality Date  ? CHOLECYSTECTOMY N/A 02/18/2013  ? Procedure: LAPAROSCOPIC CHOLECYSTECTOMY WITH INTRAOPERATIVE CHOLANGIOGRAM;  Surgeon: MIsabel Caprice  Hassell Done, MD;  Location: [facility];  Service: General;  Laterality: N/A;  ? COLONOSCOPY    ? CORONARY ANGIOPLASTY WITH STENT PLACEMENT  04/30/2007  ? 2.5 mm Promus stent that was to 95% Circ;had 60-70% lesion to RCA  ? DOPPLER ECHOCARDIOGRAPHY  05/27/2002  ? [facility].-normal EF 55-66%,  ? FOOT SURGERY Right   ? x2 fascia  ? HEEL SPUR SURGERY Right   ? HEMORRHOID SURGERY    ? Holter Monitor  04/07/2007  ? sinus tachy.;  ? JOINT REPLACEMENT N/A   ? Phreesia 07/29/2020  ? KNEE ARTHROSCOPY  03/27/2012  ? Procedure: ARTHROSCOPY KNEE;  Surgeon: Gearlean Alf, MD;   [facility];  Service: Orthopedics;  Laterality: Left;  ? NM MYOCAR PERF WALL MOTION  12/19/2011  ? EXERCISED FOR 8-1/2 MINUTES RECHING 10 METABOLIC EQUIVALENTS. NO EVIDENCE  OF ISCHEMIA OR INFARCTION . EF 71%  ? renal dopplers  04/08/2007  ? relatively normal  ? TOTAL HIP ARTHROPLASTY Right 03/16/2018  ? Procedure: RIGHT TOTAL HIP ARTHROPLASTY ANTERIOR APPROACH;  Surgeon: Gaynelle Arabian, MD;  [facility];  Service: Orthopedics;  Laterality: Right;  ? TOTAL HIP ARTHROPLASTY Left about 10 to 12 years ago  ? TOTAL KNEE ARTHROPLASTY Left 09/04/2012  ? Procedure: TOTAL KNEE ARTHROPLASTY;  Surgeon: Gearlean Alf, MD;  [facility];  Service: Orthopedics;  Laterality: Left;  ? TOTAL KNEE ARTHROPLASTY Right 01/24/2020  ? Procedure: TOTAL KNEE ARTHROPLASTY;  Surgeon: Gaynelle Arabian, MD;  [facility];  Service: Orthopedics;  Laterality: Right;  26mn  ? ?Family History  ?Problem Relation Age of Onset  ? Breast cancer Mother   ? Diabetes Mother   ? Prostate cancer Father   ? Colon cancer Neg Hx   ? ?Social History  ? ?Socioeconomic History  ? Marital status: Married  ?  Spouse name: Not on file  ? Number of children: 1  ? Years of education: Not on file  ? Highest education level: Not on file  ?Occupational History  ?  Employer: [employer] ?Tobacco Use  ? Smoking status: Never  ? Smokeless tobacco: Never  ?Vaping Use  ? Vaping Use: Never used  ?Substance and Sexual Activity  ? Alcohol use: No  ? Drug use: No  ? Sexual activity: Not on file  ?Other Topics Concern  ? Not on file  ?Social History Narrative  ? Very little 0-2 drinks a week  ? ?Social Determinants of Health  ? ?Financial Resource Strain: Low Risk   ? Difficulty of Paying Living Expenses: Not hard at all  ?Food Insecurity: No Food Insecurity  ? Worried About RCharity fundraiserin the Last Year: Never true  ? Ran Out of Food in the Last Year: Never true  ?Transportation Needs: No Transportation Needs  ? Lack of Transportation (Medical): No  ?  Lack of Transportation (Non-Medical): No  ?Physical Activity: Insufficiently Active  ? Days of Exercise per Week: 2 days  ? Minutes of Exercise per Session: 20 min  ?Stress: No Stress Concern Present  ? Feeling of Stress : Not at all  ?Social Connections: Moderately Integrated  ? Frequency of Communication with Friends and Family: Twice a week  ? Frequency of Social Gatherings with Friends and Family: Twice a week  ? Attends Religious Services: More than 4 times per year  ? Active Member of Clubs or Organizations: No  ? Attends CArchivistMeetings: Never  ? Marital Status: Married  ? ? ?Tobacco Counseling ?  Counseling given: Not Answered ? ? ?Clinical Intake: ? ?Pre-visit preparation completed: Yes ? ?Pain : No/denies pain ? ?  ? ?Nutritional Risks: None ?Diabetes: No ? ?How often do you need to have someone help you when you read instructions, pamphlets, or other written materials from your doctor or pharmacy?: 1 - Never ?What is the last grade level you completed in school?: High School ? ?Diabetic?no  ? ?Interpreter Needed?: No ? ?Information entered by :: [email or web address] ? ? ?Activities of Daily Living ? ?  11/12/2021  ?  8:20 AM  ?In your present state of health, do you have any difficulty performing the following activities:  ?Hearing? 0  ?Vision? 0  ?Difficulty concentrating or making decisions? 0  ?Walking or climbing stairs? 0  ?Dressing or bathing? 0  ?Doing errands, shopping? 0  ?Preparing Food and eating ? N  ?Using the Toilet? N  ?In the past six months, have you accidently leaked urine? N  ?Do you have problems with loss of bowel control? N  ?Managing your Medications? N  ?Managing your Finances? N  ?Housekeeping or managing your Housekeeping? N  ? ? ?Patient Care Team: ?Horald Pollen, MD as PCP - General (Internal Medicine) ?Croitoru, Dani Gobble, MD as PCP - Cardiology (Cardiology) ?Ladene Artist, MD as Consulting Physician (Gastroenterology) ?Croitoru, Dani Gobble, MD as Consulting Physician  (Cardiology) ? ?Indicate any recent Medical Services you may have received from other than [facility] providers in the past year (date may be approximate). ? ?   ?Assessment:  ? This is a routine wellness examination for

## 2021-11-12 NOTE — Patient Instructions (Signed)
Samuel Chambers , ?Thank you for taking time to come for your Medicare Wellness Visit. I appreciate your ongoing commitment to your health goals. Please review the following plan we discussed and let me know if I can assist you in the future.  ? ?Screening recommendations/referrals: ?Colonoscopy: referral 11/12/2021 ?Recommended yearly ophthalmology/optometry visit for glaucoma screening and checkup ?Recommended yearly dental visit for hygiene and checkup ? ?Vaccinations: ?Influenza vaccine: declined  ?Pneumococcal vaccine: completed  ?Tdap vaccine: 02/13/2014 ?Shingles vaccine: will consider    ? ?Advanced directives: none  ? ?Conditions/risks identified: none  ? ?Next appointment: none  ? ?Preventive Care 26 Years and Older, Male ?Preventive care refers to lifestyle choices and visits with your health care provider that can promote health and wellness. ?What does preventive care include? ?A yearly physical exam. This is also called an annual well check. ?Dental exams once or twice a year. ?Routine eye exams. Ask your health care provider how often you should have your eyes checked. ?Personal lifestyle choices, including: ?Daily care of your teeth and gums. ?Regular physical activity. ?Eating a healthy diet. ?Avoiding tobacco and drug use. ?Limiting alcohol use. ?Practicing safe sex. ?Taking low doses of aspirin every day. ?Taking vitamin and mineral supplements as recommended by your health care provider. ?What happens during an annual well check? ?The services and screenings done by your health care provider during your annual well check will depend on your age, overall health, lifestyle risk factors, and family history of disease. ?Counseling  ?Your health care provider may ask you questions about your: ?Alcohol use. ?Tobacco use. ?Drug use. ?Emotional well-being. ?Home and relationship well-being. ?Sexual activity. ?Eating habits. ?History of falls. ?Memory and ability to understand (cognition). ?Work and work  Statistician. ?Screening  ?You may have the following tests or measurements: ?Height, weight, and BMI. ?Blood pressure. ?Lipid and cholesterol levels. These may be checked every 5 years, or more frequently if you are over 41 years old. ?Skin check. ?Lung cancer screening. You may have this screening every year starting at age 2 if you have a 30-pack-year history of smoking and currently smoke or have quit within the past 15 years. ?Fecal occult blood test (FOBT) of the stool. You may have this test every year starting at age 37. ?Flexible sigmoidoscopy or colonoscopy. You may have a sigmoidoscopy every 5 years or a colonoscopy every 10 years starting at age 76. ?Prostate cancer screening. Recommendations will vary depending on your family history and other risks. ?Hepatitis C blood test. ?Hepatitis B blood test. ?Sexually transmitted disease (STD) testing. ?Diabetes screening. This is done by checking your blood sugar (glucose) after you have not eaten for a while (fasting). You may have this done every 1-3 years. ?Abdominal aortic aneurysm (AAA) screening. You may need this if you are a current or former smoker. ?Osteoporosis. You may be screened starting at age 94 if you are at high risk. ?Talk with your health care provider about your test results, treatment options, and if necessary, the need for more tests. ?Vaccines  ?Your health care provider may recommend certain vaccines, such as: ?Influenza vaccine. This is recommended every year. ?Tetanus, diphtheria, and acellular pertussis (Tdap, Td) vaccine. You may need a Td booster every 10 years. ?Zoster vaccine. You may need this after age 40. ?Pneumococcal 13-valent conjugate (PCV13) vaccine. One dose is recommended after age 84. ?Pneumococcal polysaccharide (PPSV23) vaccine. One dose is recommended after age 62. ?Talk to your health care provider about which screenings and vaccines you need and how often  you need them. ?This information is not intended to replace  advice given to you by your health care provider. Make sure you discuss any questions you have with your health care provider. ?Document Released: 08/11/2015 Document Revised: 04/03/2016 Document Reviewed: 05/16/2015 ?Elsevier Interactive Patient Education ? 2017 [place] [employer]. ? ?Fall Prevention in the Home ?Falls can cause injuries. They can happen to people of all ages. There are many things you can do to make your home safe and to help prevent falls. ?What can I do on the outside of my home? ?Regularly fix the edges of walkways and driveways and fix any cracks. ?Remove anything that might make you trip as you walk through a door, such as a raised step or threshold. ?Trim any bushes or trees on the path to your home. ?Use bright outdoor lighting. ?Clear any walking paths of anything that might make someone trip, such as rocks or tools. ?Regularly check to see if handrails are loose or broken. Make sure that both sides of any steps have handrails. ?Any raised decks and porches should have guardrails on the edges. ?Have any leaves, snow, or ice cleared regularly. ?Use sand or salt on walking paths during winter. ?Clean up any spills in your garage right away. This includes oil or grease spills. ?What can I do in the bathroom? ?Use night lights. ?Install grab bars by the toilet and in the tub and shower. Do not use towel bars as grab bars. ?Use non-skid mats or decals in the tub or shower. ?If you need to sit down in the shower, use a plastic, non-slip stool. ?Keep the floor dry. Clean up any water that spills on the floor as soon as it happens. ?Remove soap buildup in the tub or shower regularly. ?Attach bath mats securely with double-sided non-slip rug tape. ?Do not have throw rugs and other things on the floor that can make you trip. ?What can I do in the bedroom? ?Use night lights. ?Make sure that you have a light by your bed that is easy to reach. ?Do not use any sheets or blankets that are too big for your bed.  They should not hang down onto the floor. ?Have a firm chair that has side arms. You can use this for support while you get dressed. ?Do not have throw rugs and other things on the floor that can make you trip. ?What can I do in the kitchen? ?Clean up any spills right away. ?Avoid walking on wet floors. ?Keep items that you use a lot in easy-to-reach places. ?If you need to reach something above you, use a strong step stool that has a grab bar. ?Keep electrical cords out of the way. ?Do not use floor polish or wax that makes floors slippery. If you must use wax, use non-skid floor wax. ?Do not have throw rugs and other things on the floor that can make you trip. ?What can I do with my stairs? ?Do not leave any items on the stairs. ?Make sure that there are handrails on both sides of the stairs and use them. Fix handrails that are broken or loose. Make sure that handrails are as long as the stairways. ?Check any carpeting to make sure that it is firmly attached to the stairs. Fix any carpet that is loose or worn. ?Avoid having throw rugs at the top or bottom of the stairs. If you do have throw rugs, attach them to the floor with carpet tape. ?Make sure that you have a light  switch at the top of the stairs and the bottom of the stairs. If you do not have them, ask someone to add them for you. ?What else can I do to help prevent falls? ?Wear shoes that: ?Do not have high heels. ?Have rubber bottoms. ?Are comfortable and fit you well. ?Are closed at the toe. Do not wear sandals. ?If you use a stepladder: ?Make sure that it is fully opened. Do not climb a closed stepladder. ?Make sure that both sides of the stepladder are locked into place. ?Ask someone to hold it for you, if possible. ?Clearly mark and make sure that you can see: ?Any grab bars or handrails. ?First and last steps. ?Where the edge of each step is. ?Use tools that help you move around (mobility aids) if they are needed. These  include: ?Canes. ?Walkers. ?Scooters. ?Crutches. ?Turn on the lights when you go into a dark area. Replace any light bulbs as soon as they burn out. ?Set up your furniture so you have a clear path. Avoid moving your furniture around

## 2022-01-31 ENCOUNTER — Other Ambulatory Visit: Payer: Self-pay | Admitting: Cardiovascular Disease

## 2022-02-05 ENCOUNTER — Other Ambulatory Visit: Payer: Self-pay | Admitting: Emergency Medicine

## 2022-02-05 ENCOUNTER — Encounter: Payer: Self-pay | Admitting: Emergency Medicine

## 2022-02-05 DIAGNOSIS — F411 Generalized anxiety disorder: Secondary | ICD-10-CM

## 2022-02-05 MED ORDER — DULOXETINE HCL 60 MG PO CPEP
60.0000 mg | ORAL_CAPSULE | Freq: Every day | ORAL | 0 refills | Status: DC
Start: 2022-02-05 — End: 2022-03-01

## 2022-02-05 NOTE — Telephone Encounter (Signed)
I spoke with Abby at [facility] who called to inquire why we had not received any of their refill requests for this patient. After verifying our fax number Abby states that they has an incorrect fax number. She has updated this information in their system and will now send the request to the correct fax number.

## 2022-02-05 NOTE — Telephone Encounter (Signed)
I agree with your response.  Thank you.

## 2022-02-05 NOTE — Telephone Encounter (Signed)
Patient is scheduled with provider on 02/12/2022

## 2022-02-12 ENCOUNTER — Encounter: Payer: Self-pay | Admitting: Emergency Medicine

## 2022-02-12 ENCOUNTER — Ambulatory Visit (INDEPENDENT_AMBULATORY_CARE_PROVIDER_SITE_OTHER): Payer: Medicare Other | Admitting: Emergency Medicine

## 2022-02-12 VITALS — BP 136/84 | HR 67 | Temp 98.4°F | Ht 72.0 in | Wt 230.2 lb

## 2022-02-12 DIAGNOSIS — I10 Essential (primary) hypertension: Secondary | ICD-10-CM | POA: Diagnosis not present

## 2022-02-12 DIAGNOSIS — E785 Hyperlipidemia, unspecified: Secondary | ICD-10-CM

## 2022-02-12 DIAGNOSIS — Z8739 Personal history of other diseases of the musculoskeletal system and connective tissue: Secondary | ICD-10-CM

## 2022-02-12 DIAGNOSIS — M159 Polyosteoarthritis, unspecified: Secondary | ICD-10-CM | POA: Diagnosis not present

## 2022-02-12 MED ORDER — MELOXICAM 7.5 MG PO TABS: 7.5000 mg | ORAL_TABLET | Freq: Every day | ORAL | 1 refills | Status: DC

## 2022-02-12 NOTE — Patient Instructions (Signed)
Hypertension, Adult High blood pressure (hypertension) is when the force of blood pumping through the arteries is too strong. The arteries are the blood vessels that carry blood from the heart throughout the body. Hypertension forces the heart to work harder to pump blood and may cause arteries to become narrow or stiff. Untreated or uncontrolled hypertension can lead to a heart attack, heart failure, a stroke, kidney disease, and other problems. A blood pressure reading consists of a higher number over a lower number. Ideally, your blood pressure should be below 120/80. The first ("top") number is called the systolic pressure. It is a measure of the pressure in your arteries as your heart beats. The second ("bottom") number is called the diastolic pressure. It is a measure of the pressure in your arteries as the heart relaxes. What are the causes? The exact cause of this condition is not known. There are some conditions that result in high blood pressure. What increases the risk? Certain factors may make you more likely to develop high blood pressure. Some of these risk factors are under your control, including: Smoking. Not getting enough exercise or physical activity. Being overweight. Having too much fat, sugar, calories, or salt (sodium) in your diet. Drinking too much alcohol. Other risk factors include: Having a personal history of heart disease, diabetes, high cholesterol, or kidney disease. Stress. Having a family history of high blood pressure and high cholesterol. Having obstructive sleep apnea. Age. The risk increases with age. What are the signs or symptoms? High blood pressure may not cause symptoms. Very high blood pressure (hypertensive crisis) may cause: Headache. Fast or irregular heartbeats (palpitations). Shortness of breath. Nosebleed. Nausea and vomiting. Vision changes. Severe chest pain, dizziness, and seizures. How is this diagnosed? This condition is diagnosed by  measuring your blood pressure while you are seated, with your arm resting on a flat surface, your legs uncrossed, and your feet flat on the floor. The cuff of the blood pressure monitor will be placed directly against the skin of your upper arm at the level of your heart. Blood pressure should be measured at least twice using the same arm. Certain conditions can cause a difference in blood pressure between your right and left arms. If you have a high blood pressure reading during one visit or you have normal blood pressure with other risk factors, you may be asked to: Return on a different day to have your blood pressure checked again. Monitor your blood pressure at home for 1 week or longer. If you are diagnosed with hypertension, you may have other blood or imaging tests to help your health care provider understand your overall risk for other conditions. How is this treated? This condition is treated by making healthy lifestyle changes, such as eating healthy foods, exercising more, and reducing your alcohol intake. You may be referred for counseling on a healthy diet and physical activity. Your health care provider may prescribe medicine if lifestyle changes are not enough to get your blood pressure under control and if: Your systolic blood pressure is above 130. Your diastolic blood pressure is above 80. Your personal target blood pressure may vary depending on your medical conditions, your age, and other factors. Follow these instructions at home: Eating and drinking  Eat a diet that is high in fiber and potassium, and low in sodium, added sugar, and fat. An example of this eating plan is called the DASH diet. DASH stands for Dietary Approaches to Stop Hypertension. To eat this way: Eat   plenty of fresh fruits and vegetables. Try to fill one half of your plate at each meal with fruits and vegetables. Eat whole grains, such as whole-wheat pasta, brown rice, or whole-grain bread. Fill about one  fourth of your plate with whole grains. Eat or drink low-fat dairy products, such as skim milk or low-fat yogurt. Avoid fatty cuts of meat, processed or cured meats, and poultry with skin. Fill about one fourth of your plate with lean proteins, such as fish, chicken without skin, beans, eggs, or tofu. Avoid pre-made and processed foods. These tend to be higher in sodium, added sugar, and fat. Reduce your daily sodium intake. Many people with hypertension should eat less than 1,500 mg of sodium a day. Do not drink alcohol if: Your health care provider tells you not to drink. You are pregnant, may be pregnant, or are planning to become pregnant. If you drink alcohol: Limit how much you have to: 0-1 drink a day for women. 0-2 drinks a day for men. Know how much alcohol is in your drink. In the [place] one drink equals one 12 oz bottle of beer (355 mL), one 5 oz glass of wine (148 mL), or one 1 oz glass of hard liquor (44 mL). Lifestyle  Work with your health care provider to maintain a healthy body weight or to lose weight. Ask what an ideal weight is for you. Get at least 30 minutes of exercise that causes your heart to beat faster (aerobic exercise) most days of the week. Activities may include walking, swimming, or biking. Include exercise to strengthen your muscles (resistance exercise), such as Pilates or lifting weights, as part of your weekly exercise routine. Try to do these types of exercises for 30 minutes at least 3 days a week. Do not use any products that contain nicotine or tobacco. These products include cigarettes, chewing tobacco, and vaping devices, such as e-cigarettes. If you need help quitting, ask your health care provider. Monitor your blood pressure at home as told by your health care provider. Keep all follow-up visits. This is important. Medicines Take over-the-counter and prescription medicines only as told by your health care provider. Follow directions carefully. Blood  pressure medicines must be taken as prescribed. Do not skip doses of blood pressure medicine. Doing this puts you at risk for problems and can make the medicine less effective. Ask your health care provider about side effects or reactions to medicines that you should watch for. Contact a health care provider if you: Think you are having a reaction to a medicine you are taking. Have headaches that keep coming back (recurring). Feel dizzy. Have swelling in your ankles. Have trouble with your vision. Get help right away if you: Develop a severe headache or confusion. Have unusual weakness or numbness. Feel faint. Have severe pain in your chest or abdomen. Vomit repeatedly. Have trouble breathing. These symptoms may be an emergency. Get help right away. Call 911. Do not wait to see if the symptoms will go away. Do not drive yourself to the hospital. Summary Hypertension is when the force of blood pumping through your arteries is too strong. If this condition is not controlled, it may put you at risk for serious complications. Your personal target blood pressure may vary depending on your medical conditions, your age, and other factors. For most people, a normal blood pressure is less than 120/80. Hypertension is treated with lifestyle changes, medicines, or a combination of both. Lifestyle changes include losing weight, eating a healthy,   low-sodium diet, exercising more, and limiting alcohol. This information is not intended to replace advice given to you by your health care provider. Make sure you discuss any questions you have with your health care provider. Document Revised: 05/22/2021 Document Reviewed: 05/22/2021 Elsevier Patient Education  2023 [employer]  

## 2022-02-12 NOTE — Assessment & Plan Note (Signed)
Presently stable.  Meloxicam helps during flareups.

## 2022-02-12 NOTE — Assessment & Plan Note (Signed)
Well-controlled hypertension BP Readings from Last 3 Encounters:  02/12/22 136/84  06/20/21 116/62  02/07/21 128/76  Continue hydrochlorothiazide 12.5 mg, losartan 100 mg and metoprolol succinate 50 mg daily. Cardiovascular risks associated with hypertension discussed. Diet and nutrition discussed. Follow-up in 6 months.

## 2022-02-12 NOTE — Assessment & Plan Note (Signed)
Stable.  Tylenol as needed Meloxicam as needed

## 2022-02-12 NOTE — Assessment & Plan Note (Signed)
Well-controlled on diet alone.

## 2022-02-12 NOTE — Progress Notes (Signed)
Samuel Chambers 71 y.o.   Chief Complaint  Patient presents with   Follow-up    F/u appt medication refill     HISTORY OF PRESENT ILLNESS: This is a 71 y.o. male with history hypertension here for follow-up. Also has history of osteoarthritis recurrent gout flareups. Overall doing well.  Has no complaints or medical concerns today.  HPI   Prior to Admission medications   Medication Sig Start Date End Date Taking? Authorizing Provider  aspirin EC 81 MG tablet Take 1 tablet (81 mg total) by mouth daily. Swallow whole. 06/13/20  Yes Croitoru, Mihai, MD  DULoxetine (CYMBALTA) 60 MG capsule Take 1 capsule (60 mg total) by mouth daily. TAKE 1 CAPSULE(60 MG) BY MOUTH DAILY 02/05/22 05/06/22 Yes Kristell Wooding, Ines Bloomer, MD  hydrochlorothiazide (MICROZIDE) 12.5 MG capsule Take 1 capsule (12.5 mg total) by mouth daily. 02/01/22 05/02/22 Yes Croitoru, Mihai, MD  losartan (COZAAR) 100 MG tablet TAKE 1 TABLET BY MOUTH EVERY DAY 11/05/21  Yes Croitoru, Mihai, MD  metoprolol succinate (TOPROL-XL) 50 MG 24 hr tablet TAKE 1 TABLET BY MOUTH DAILY WITH OR IMMEDIATELY FOLLOWING A MEAL 07/31/21  Yes Croitoru, Mihai, MD  meloxicam (MOBIC) 7.5 MG tablet Take 1 tablet (7.5 mg total) by mouth daily. 02/07/21   Horald Pollen, MD    Allergies  Allergen Reactions   Crestor [Rosuvastatin] Other (See Comments)    myalgias   Valsartan Other (See Comments)    Made patient feel tired and no energy   Warfarin And Related Other (See Comments)    Fast heart beat, went down hill, felt like he was dying    Patient Active Problem List   Diagnosis Date Noted   History of gout 02/07/2021   Primary osteoarthritis of right knee 01/24/2020   OA (osteoarthritis) of hip 03/16/2018   Elevated hemoglobin A1c 03/14/2018   Severe obesity (BMI 35.0-35.9 with comorbidity) ([place]) 08/04/2014   Hyperlipidemia with target LDL less than 70; statin intolerant 04/29/2013   CAD S/P percutaneous coronary angioplasty    S/P  laparoscopic cholecystectomy July 2014 02/18/2013   OA (osteoarthritis) of knee 09/04/2012   Anxiety state 02/24/2007   Essential hypertension 02/24/2007   GERD 02/24/2007   Osteoarthritis of multiple joints 02/24/2007    Past Medical History:  Diagnosis Date   Adenomatous colon polyp 03/2005   Anxiety    CAD S/P percutaneous coronary angioplasty 2008   PCI to circumflex with a Promus DES 2.5 mm x 8 mm   Degenerative joint disease    Diabetes mellitus without complication ([place])    diet controlled   Diverticulosis    Fatty liver 2016   pt denies   Fibromyalgia    GERD (gastroesophageal reflux disease)    Heart murmur    Phreesia 07/29/2020   Hyperlipidemia    statin intolerant   Hypertension    Internal hemorrhoids    Obesity, Class II, BMI 35-39.9    Pneumonia 2016   PONV (postoperative nausea and vomiting)     Past Surgical History:  Procedure Laterality Date   CHOLECYSTECTOMY N/A 02/18/2013   Procedure: LAPAROSCOPIC CHOLECYSTECTOMY WITH INTRAOPERATIVE CHOLANGIOGRAM;  Surgeon: Pedro Earls, MD;  Location: [facility];  Service: General;  Laterality: N/A;   COLONOSCOPY     CORONARY ANGIOPLASTY WITH STENT PLACEMENT  04/30/2007   2.5 mm Promus stent that was to 95% Circ;had 60-70% lesion to RCA   DOPPLER ECHOCARDIOGRAPHY  05/27/2002   [facility]normal EF 55-66%,   FOOT SURGERY Right  x2 fascia   HEEL SPUR SURGERY Right    HEMORRHOID SURGERY     Holter Monitor  04/07/2007   sinus tachy.;   JOINT REPLACEMENT N/A    Phreesia 07/29/2020   KNEE ARTHROSCOPY  03/27/2012   Procedure: ARTHROSCOPY KNEE;  Surgeon: Gearlean Alf, MD;  Location: [facility];  Service: Orthopedics;  Laterality: Left;   NM MYOCAR PERF WALL MOTION  12/19/2011   EXERCISED FOR 8-1/2 MINUTES RECHING 10 METABOLIC EQUIVALENTS. NO EVIDENCE  OF ISCHEMIA OR INFARCTION . EF 71%   renal dopplers  04/08/2007   relatively normal   TOTAL HIP ARTHROPLASTY Right 03/16/2018   Procedure: RIGHT TOTAL HIP ARTHROPLASTY  ANTERIOR APPROACH;  Surgeon: Gaynelle Arabian, MD;  Location: [facility];  Service: Orthopedics;  Laterality: Right;   TOTAL HIP ARTHROPLASTY Left about 10 to 12 years ago   TOTAL KNEE ARTHROPLASTY Left 09/04/2012   Procedure: TOTAL KNEE ARTHROPLASTY;  Surgeon: Gearlean Alf, MD;  Location: [facility];  Service: Orthopedics;  Laterality: Left;   TOTAL KNEE ARTHROPLASTY Right 01/24/2020   Procedure: TOTAL KNEE ARTHROPLASTY;  Surgeon: Gaynelle Arabian, MD;  Location: [facility];  Service: Orthopedics;  Laterality: Right;  60mn    Social History   Socioeconomic History   Marital status: Married    Spouse name: Not on file   Number of children: 1   Years of education: Not on file   Highest education level: Not on file  Occupational History    Employer: [employer]  Tobacco Use   Smoking status: Never   Smokeless tobacco: Never  Vaping Use   Vaping Use: Never used  Substance and Sexual Activity   Alcohol use: No   Drug use: No   Sexual activity: Not on file  Other Topics Concern   Not on file  Social History Narrative   Very little 0-2 drinks a week   Social Determinants of Health   Financial Resource Strain: Low Risk  (11/12/2021)   Overall Financial Resource Strain (CARDIA)    Difficulty of Paying Living Expenses: Not hard at all  Food Insecurity: No Food Insecurity (11/12/2021)   Hunger Vital Sign    Worried About Running Out of Food in the Last Year: Never true    Ran Out of Food in the Last Year: Never true  Transportation Needs: No Transportation Needs (11/12/2021)   PRAPARE - THydrologist(Medical): No    Lack of Transportation (Non-Medical): No  Physical Activity: Insufficiently Active (11/12/2021)   Exercise Vital Sign    Days of Exercise per Week: 2 days    Minutes of Exercise per Session: 20 min  Stress: No Stress Concern Present (11/12/2021)   [place]   Feeling of Stress : Not at  all  Social Connections: Moderately Integrated (11/12/2021)   Social Connection and Isolation Panel [NHANES]    Frequency of Communication with Friends and Family: Twice a week    Frequency of Social Gatherings with Friends and Family: Twice a week    Attends Religious Services: More than 4 times per year    Active Member of [employer] Organizations: No    Attends CArchivistMeetings: Never    Marital Status: Married  IHuman resources officerViolence: Not At Risk (11/12/2021)   Humiliation, Afraid, Rape, and Kick questionnaire    Fear of Current or Ex-Partner: No    Emotionally Abused: No    Physically Abused: No  Sexually Abused: No    Family History  Problem Relation Age of Onset   Breast cancer Mother    Diabetes Mother    Prostate cancer Father    Colon cancer Neg Hx      Review of Systems  Constitutional: Negative.  Negative for chills and fever.  HENT: Negative.  Negative for congestion and sore throat.   Respiratory: Negative.  Negative for cough and shortness of breath.   Cardiovascular: Negative.  Negative for chest pain and palpitations.  Skin: Negative.  Negative for rash.  All other systems reviewed and are negative.  Today's Vitals   02/12/22 0814  BP: 136/84  Pulse: 67  Temp: 98.4 F (36.9 C)  TempSrc: Oral  SpO2: 94%  Weight: 230 lb 4 oz (104.4 kg)  Height: 6' (1.829 m)   Body mass index is 31.23 kg/m. Wt Readings from Last 3 Encounters:  02/12/22 230 lb 4 oz (104.4 kg)  06/20/21 201 lb 6.4 oz (91.4 kg)  02/07/21 220 lb (99.8 kg)     Physical Exam Vitals reviewed.  Constitutional:      Appearance: Normal appearance.  HENT:     Head: Normocephalic.  Eyes:     Extraocular Movements: Extraocular movements intact.     Conjunctiva/sclera: Conjunctivae normal.     Pupils: Pupils are equal, round, and reactive to light.  Cardiovascular:     Rate and Rhythm: Normal rate and regular rhythm.     Pulses: Normal pulses.     Heart sounds: Normal  heart sounds.  Pulmonary:     Effort: Pulmonary effort is normal.     Breath sounds: Normal breath sounds.  Musculoskeletal:     Cervical back: No tenderness.     Right lower leg: No edema.     Left lower leg: No edema.  Lymphadenopathy:     Cervical: No cervical adenopathy.  Skin:    General: Skin is warm and dry.     Capillary Refill: Capillary refill takes less than 2 seconds.  Neurological:     General: No focal deficit present.     Mental Status: He is alert and oriented to person, place, and time.  Psychiatric:        Mood and Affect: Mood normal.        Behavior: Behavior normal.      ASSESSMENT & PLAN: A total of 47 minutes was spent with the patient and counseling/coordination of care regarding preparing for this visit, review of most recent office visit notes, review of multiple chronic medical problems and their management, review of all medications, review of most recent blood work results, review of health maintenance items, prognosis, education on nutrition, documentation, and need for follow-up.  Problem List Items Addressed This Visit       Cardiovascular and Mediastinum   Essential hypertension - Primary (Chronic)    Well-controlled hypertension BP Readings from Last 3 Encounters:  02/12/22 136/84  06/20/21 116/62  02/07/21 128/76  Continue hydrochlorothiazide 12.5 mg, losartan 100 mg and metoprolol succinate 50 mg daily. Cardiovascular risks associated with hypertension discussed. Diet and nutrition discussed. Follow-up in 6 months.         Musculoskeletal and Integument   Osteoarthritis of multiple joints    Stable.  Tylenol as needed Meloxicam as needed      Relevant Medications   meloxicam (MOBIC) 7.5 MG tablet     Other   Hyperlipidemia with target LDL less than 70; statin intolerant (Chronic)    Well-controlled on  diet alone.      History of gout    Presently stable.  Meloxicam helps during flareups.      Relevant Medications    meloxicam (MOBIC) 7.5 MG tablet   Patient Instructions  Hypertension, Adult High blood pressure (hypertension) is when the force of blood pumping through the arteries is too strong. The arteries are the blood vessels that carry blood from the heart throughout the body. Hypertension forces the heart to work harder to pump blood and may cause arteries to become narrow or stiff. Untreated or uncontrolled hypertension can lead to a heart attack, heart failure, a stroke, kidney disease, and other problems. A blood pressure reading consists of a higher number over a lower number. Ideally, your blood pressure should be below 120/80. The first ("top") number is called the systolic pressure. It is a measure of the pressure in your arteries as your heart beats. The second ("bottom") number is called the diastolic pressure. It is a measure of the pressure in your arteries as the heart relaxes. What are the causes? The exact cause of this condition is not known. There are some conditions that result in high blood pressure. What increases the risk? Certain factors may make you more likely to develop high blood pressure. Some of these risk factors are under your control, including: Smoking. Not getting enough exercise or physical activity. Being overweight. Having too much fat, sugar, calories, or salt (sodium) in your diet. Drinking too much alcohol. Other risk factors include: Having a personal history of heart disease, diabetes, high cholesterol, or kidney disease. Stress. Having a family history of high blood pressure and high cholesterol. Having obstructive sleep apnea. Age. The risk increases with age. What are the signs or symptoms? High blood pressure may not cause symptoms. Very high blood pressure (hypertensive crisis) may cause: Headache. Fast or irregular heartbeats (palpitations). Shortness of breath. Nosebleed. Nausea and vomiting. Vision changes. Severe chest pain, dizziness, and  seizures. How is this diagnosed? This condition is diagnosed by measuring your blood pressure while you are seated, with your arm resting on a flat surface, your legs uncrossed, and your feet flat on the floor. The cuff of the blood pressure monitor will be placed directly against the skin of your upper arm at the level of your heart. Blood pressure should be measured at least twice using the same arm. Certain conditions can cause a difference in blood pressure between your right and left arms. If you have a high blood pressure reading during one visit or you have normal blood pressure with other risk factors, you may be asked to: Return on a different day to have your blood pressure checked again. Monitor your blood pressure at home for 1 week or longer. If you are diagnosed with hypertension, you may have other blood or imaging tests to help your health care provider understand your overall risk for other conditions. How is this treated? This condition is treated by making healthy lifestyle changes, such as eating healthy foods, exercising more, and reducing your alcohol intake. You may be referred for counseling on a healthy diet and physical activity. Your health care provider may prescribe medicine if lifestyle changes are not enough to get your blood pressure under control and if: Your systolic blood pressure is above 130. Your diastolic blood pressure is above 80. Your personal target blood pressure may vary depending on your medical conditions, your age, and other factors. Follow these instructions at home: Eating and drinking  Eat a  diet that is high in fiber and potassium, and low in sodium, added sugar, and fat. An example of this eating plan is called the DASH diet. DASH stands for Dietary Approaches to Stop Hypertension. To eat this way: Eat plenty of fresh fruits and vegetables. Try to fill one half of your plate at each meal with fruits and vegetables. Eat whole grains, such as  whole-wheat pasta, brown rice, or whole-grain bread. Fill about one fourth of your plate with whole grains. Eat or drink low-fat dairy products, such as skim milk or low-fat yogurt. Avoid fatty cuts of meat, processed or cured meats, and poultry with skin. Fill about one fourth of your plate with lean proteins, such as fish, chicken without skin, beans, eggs, or tofu. Avoid pre-made and processed foods. These tend to be higher in sodium, added sugar, and fat. Reduce your daily sodium intake. Many people with hypertension should eat less than 1,500 mg of sodium a day. Do not drink alcohol if: Your health care provider tells you not to drink. You are pregnant, may be pregnant, or are planning to become pregnant. If you drink alcohol: Limit how much you have to: 0-1 drink a day for women. 0-2 drinks a day for men. Know how much alcohol is in your drink. In the [place] one drink equals one 12 oz bottle of beer (355 mL), one 5 oz glass of wine (148 mL), or one 1 oz glass of hard liquor (44 mL). Lifestyle  Work with your health care provider to maintain a healthy body weight or to lose weight. Ask what an ideal weight is for you. Get at least 30 minutes of exercise that causes your heart to beat faster (aerobic exercise) most days of the week. Activities may include walking, swimming, or biking. Include exercise to strengthen your muscles (resistance exercise), such as Pilates or lifting weights, as part of your weekly exercise routine. Try to do these types of exercises for 30 minutes at least 3 days a week. Do not use any products that contain nicotine or tobacco. These products include cigarettes, chewing tobacco, and vaping devices, such as e-cigarettes. If you need help quitting, ask your health care provider. Monitor your blood pressure at home as told by your health care provider. Keep all follow-up visits. This is important. Medicines Take over-the-counter and prescription medicines only as  told by your health care provider. Follow directions carefully. Blood pressure medicines must be taken as prescribed. Do not skip doses of blood pressure medicine. Doing this puts you at risk for problems and can make the medicine less effective. Ask your health care provider about side effects or reactions to medicines that you should watch for. Contact a health care provider if you: Think you are having a reaction to a medicine you are taking. Have headaches that keep coming back (recurring). Feel dizzy. Have swelling in your ankles. Have trouble with your vision. Get help right away if you: Develop a severe headache or confusion. Have unusual weakness or numbness. Feel faint. Have severe pain in your chest or abdomen. Vomit repeatedly. Have trouble breathing. These symptoms may be an emergency. Get help right away. Call 911. Do not wait to see if the symptoms will go away. Do not drive yourself to the hospital. Summary Hypertension is when the force of blood pumping through your arteries is too strong. If this condition is not controlled, it may put you at risk for serious complications. Your personal target blood pressure may vary depending  on your medical conditions, your age, and other factors. For most people, a normal blood pressure is less than 120/80. Hypertension is treated with lifestyle changes, medicines, or a combination of both. Lifestyle changes include losing weight, eating a healthy, low-sodium diet, exercising more, and limiting alcohol. This information is not intended to replace advice given to you by your health care provider. Make sure you discuss any questions you have with your health care provider. Document Revised: 05/22/2021 Document Reviewed: 05/22/2021 Elsevier Patient Education  [place], MD [facility]

## 2022-03-01 ENCOUNTER — Other Ambulatory Visit: Payer: Self-pay | Admitting: Emergency Medicine

## 2022-03-01 DIAGNOSIS — F411 Generalized anxiety disorder: Secondary | ICD-10-CM

## 2022-03-13 ENCOUNTER — Encounter: Payer: Self-pay | Admitting: Gastroenterology

## 2022-04-10 ENCOUNTER — Ambulatory Visit (AMBULATORY_SURGERY_CENTER): Payer: Self-pay | Admitting: *Deleted

## 2022-04-10 VITALS — Ht 72.0 in | Wt 242.0 lb

## 2022-04-10 DIAGNOSIS — Z86010 Personal history of colonic polyps: Secondary | ICD-10-CM

## 2022-04-10 MED ORDER — NA SULFATE-K SULFATE-MG SULF 17.5-3.13-1.6 GM/177ML PO SOLN: 1.0000 | Freq: Once | ORAL | 0 refills | Status: AC

## 2022-04-10 NOTE — Progress Notes (Signed)
No egg or soy allergy known to patient  No issues known to pt with past sedation with any surgeries or procedures Patient denies ever being told they had issues or difficulty with intubation  No FH of Malignant Hyperthermia Pt is not on diet pills Pt is not on home 02  Pt is not on blood thinners  Pt denies issues with constipation  No A fib or A flutter Have any cardiac testing pending--NO Pt instructed to use Singlecare.com or GoodRx for a price reduction on prep   

## 2022-04-18 ENCOUNTER — Encounter: Payer: Self-pay | Admitting: Emergency Medicine

## 2022-04-18 ENCOUNTER — Ambulatory Visit (INDEPENDENT_AMBULATORY_CARE_PROVIDER_SITE_OTHER): Payer: Medicare Other | Admitting: Emergency Medicine

## 2022-04-18 VITALS — BP 134/68 | HR 71 | Temp 98.4°F | Ht 72.0 in | Wt 243.1 lb

## 2022-04-18 DIAGNOSIS — J029 Acute pharyngitis, unspecified: Secondary | ICD-10-CM | POA: Diagnosis not present

## 2022-04-18 DIAGNOSIS — R6889 Other general symptoms and signs: Secondary | ICD-10-CM | POA: Diagnosis not present

## 2022-04-18 DIAGNOSIS — U071 COVID-19: Secondary | ICD-10-CM | POA: Diagnosis not present

## 2022-04-18 DIAGNOSIS — R051 Acute cough: Secondary | ICD-10-CM | POA: Insufficient documentation

## 2022-04-18 LAB — POC COVID19 BINAXNOW: SARS Coronavirus 2 Ag: POSITIVE — AB

## 2022-04-18 MED ORDER — BENZONATATE 200 MG PO CAPS: 200.0000 mg | ORAL_CAPSULE | Freq: Two times a day (BID) | ORAL | 0 refills | Status: DC | PRN

## 2022-04-18 MED ORDER — NIRMATRELVIR/RITONAVIR (PAXLOVID)TABLET: 3.0000 | ORAL_TABLET | Freq: Two times a day (BID) | ORAL | 0 refills | Status: AC

## 2022-04-18 MED ORDER — HYDROCODONE BIT-HOMATROP MBR 5-1.5 MG/5ML PO SOLN: 5.0000 mL | Freq: Every evening | ORAL | 0 refills | Status: DC | PRN

## 2022-04-18 NOTE — Patient Instructions (Signed)

## 2022-04-18 NOTE — Assessment & Plan Note (Signed)
Recommend over-the-counter Mucinex DM Tessalon 200 mg 3 times a day as needed and Hycodan syrup as needed Advised to stay well-hydrated and rest Cough drops may help.

## 2022-04-18 NOTE — Assessment & Plan Note (Signed)
Clinically stable.  No complications.  No red flag signs or symptoms. Immune system responding well. May benefit from Paxlovid for 5 days Advised to stay well-hydrated and rest ED precautions given COVID advice given Advised to contact the office if no better or worse during the next several days.

## 2022-04-18 NOTE — Progress Notes (Signed)
Samuel Chambers 71 y.o.   Chief Complaint  Patient presents with   Cough   Sore Throat   chest congestion    HISTORY OF PRESENT ILLNESS: Acute problem visit today. This is a 71 y.o. male complaining of flulike symptoms that started last Monday.  It started with a cough which is nonproductive, chest congestion, followed by sore throat.  Denies difficulty breathing or wheezing.  Non-smoker.  Denies chest pain.  Able to eat and drink.  Denies nausea or vomiting.  Denies abdominal pain or diarrhea.  No history of asthma or COPD. No other associated symptoms. No other complaints or medical concerns today.  Cough Associated symptoms include a sore throat. Pertinent negatives include no chest pain, chills, fever, headaches, hemoptysis, rash, shortness of breath or wheezing.  Sore Throat  Associated symptoms include congestion and coughing. Pertinent negatives include no abdominal pain, diarrhea, headaches, shortness of breath or vomiting.     Prior to Admission medications   Medication Sig Start Date End Date Taking? Authorizing Provider  aspirin EC 81 MG tablet Take 1 tablet (81 mg total) by mouth daily. Swallow whole. 06/13/20  Yes Croitoru, Mihai, MD  DULoxetine (CYMBALTA) 60 MG capsule TAKE 1 CAPSULE (60 MG) BY MOUTH DAILY 03/01/22  Yes Rachael Ferrie, Ines Bloomer, MD  gatifloxacin (ZYMAXID) 0.5 % SOLN Place 1 drop into the left eye 4 (four) times daily. 03/13/22  Yes [provider]  hydrochlorothiazide (MICROZIDE) 12.5 MG capsule Take 1 capsule (12.5 mg total) by mouth daily. 02/01/22 05/02/22 Yes Croitoru, Mihai, MD  ketorolac (ACULAR) 0.5 % ophthalmic solution Place 1 drop into the left eye 4 (four) times daily. 03/15/22  Yes [provider]  losartan (COZAAR) 100 MG tablet TAKE 1 TABLET BY MOUTH EVERY DAY 11/05/21  Yes Croitoru, Mihai, MD  meloxicam (MOBIC) 7.5 MG tablet Take 1 tablet (7.5 mg total) by mouth daily. 02/12/22  Yes Renold Kozar, Ines Bloomer, MD  metoprolol succinate  (TOPROL-XL) 50 MG 24 hr tablet TAKE 1 TABLET BY MOUTH DAILY WITH OR IMMEDIATELY FOLLOWING A MEAL 07/31/21  Yes Croitoru, Mihai, MD  Na Sulfate-K Sulfate-Mg Sulf 17.5-3.13-1.6 GM/177ML SOLN SMARTSIG:1 Kit(s) By Mouth Once 04/10/22  Yes [provider]  prednisoLONE acetate (PRED FORTE) 1 % ophthalmic suspension Place 1 drop into the left eye 4 (four) times daily. 03/15/22  Yes [provider]    Allergies  Allergen Reactions   Crestor [Rosuvastatin] Other (See Comments)    myalgias   Valsartan Other (See Comments)    Made patient feel tired and no energy   Warfarin And Related Other (See Comments)    Fast heart beat, went down hill, felt like he was dying    Patient Active Problem List   Diagnosis Date Noted   History of gout 02/07/2021   Primary osteoarthritis of right knee 01/24/2020   OA (osteoarthritis) of hip 03/16/2018   Elevated hemoglobin A1c 03/14/2018   Severe obesity (BMI 35.0-35.9 with comorbidity) ([place]) 08/04/2014   Hyperlipidemia with target LDL less than 70; statin intolerant 04/29/2013   CAD S/P percutaneous coronary angioplasty    S/P laparoscopic cholecystectomy July 2014 02/18/2013   OA (osteoarthritis) of knee 09/04/2012   Anxiety state 02/24/2007   Essential hypertension 02/24/2007   GERD 02/24/2007   Osteoarthritis of multiple joints 02/24/2007    Past Medical History:  Diagnosis Date   Adenomatous colon polyp 03/2005   Anxiety    CAD S/P percutaneous coronary angioplasty 2008   PCI to circumflex with a Promus DES  2.5 mm x 8 mm   Cataract    BILATERAL,REMOVED   Degenerative joint disease    Diabetes mellitus without complication (HCC)    diet controlled,PT.DENIES UPDATED 04/10/22   Diverticulosis    Fatty liver 2016   pt denies   Fibromyalgia    GERD (gastroesophageal reflux disease)    Heart murmur    Phreesia 07/29/2020   Hyperlipidemia    statin intolerant   Hypertension    Internal hemorrhoids    Obesity, Class II, BMI  35-39.9    Pneumonia 2016   PONV (postoperative nausea and vomiting)     Past Surgical History:  Procedure Laterality Date   CHOLECYSTECTOMY N/A 02/18/2013   Procedure: LAPAROSCOPIC CHOLECYSTECTOMY WITH INTRAOPERATIVE CHOLANGIOGRAM;  Surgeon: Pedro Earls, MD;  Location: [facility];  Service: General;  Laterality: N/A;   COLONOSCOPY     CORONARY ANGIOPLASTY WITH STENT PLACEMENT  04/30/2007   2.5 mm Promus stent that was to 95% Circ;had 60-70% lesion to RCA   DOPPLER ECHOCARDIOGRAPHY  05/27/2002   [facility].-normal EF 55-66%,   FOOT SURGERY Right    x2 fascia   HEEL SPUR SURGERY Right    HEMORRHOID SURGERY     Holter Monitor  04/07/2007   sinus tachy.;   JOINT REPLACEMENT N/A    Phreesia 07/29/2020   KNEE ARTHROSCOPY  03/27/2012   Procedure: ARTHROSCOPY KNEE;  Surgeon: Gearlean Alf, MD;  Location: [facility];  Service: Orthopedics;  Laterality: Left;   NM MYOCAR PERF WALL MOTION  12/19/2011   EXERCISED FOR 8-1/2 MINUTES RECHING 10 METABOLIC EQUIVALENTS. NO EVIDENCE  OF ISCHEMIA OR INFARCTION . EF 71%   POLYPECTOMY     renal dopplers  04/08/2007   relatively normal   TOTAL HIP ARTHROPLASTY Right 03/16/2018   Procedure: RIGHT TOTAL HIP ARTHROPLASTY ANTERIOR APPROACH;  Surgeon: Gaynelle Arabian, MD;  Location: [facility];  Service: Orthopedics;  Laterality: Right;   TOTAL HIP ARTHROPLASTY Left about 10 to 12 years ago   TOTAL KNEE ARTHROPLASTY Left 09/04/2012   Procedure: TOTAL KNEE ARTHROPLASTY;  Surgeon: Gearlean Alf, MD;  Location: [facility];  Service: Orthopedics;  Laterality: Left;   TOTAL KNEE ARTHROPLASTY Right 01/24/2020   Procedure: TOTAL KNEE ARTHROPLASTY;  Surgeon: Gaynelle Arabian, MD;  Location: [facility];  Service: Orthopedics;  Laterality: Right;  1mn    Social History   Socioeconomic History   Marital status: Married    Spouse name: Not on file   Number of children: 1   Years of education: Not on file   Highest education level: Not on file  Occupational History     Employer: [employer]  Tobacco Use   Smoking status: Never    Passive exposure: Past (PARENTS SMOKED)   Smokeless tobacco: Never  Vaping Use   Vaping Use: Never used  Substance and Sexual Activity   Alcohol use: No   Drug use: No   Sexual activity: Not on file  Other Topics Concern   Not on file  Social History Narrative   Very little 0-2 drinks a week   Social Determinants of Health   Financial Resource Strain: Low Risk  (11/12/2021)   Overall Financial Resource Strain (CARDIA)    Difficulty of Paying Living Expenses: Not hard at all  Food Insecurity: No Food Insecurity (11/12/2021)   Hunger Vital Sign    Worried About Running Out of Food in the Last Year: Never true    Ran Out of Food in the Last Year: Never true  Transportation Needs: No Transportation Needs (11/12/2021)   PRAPARE - Hydrologist (Medical): No    Lack of Transportation (Non-Medical): No  Physical Activity: Insufficiently Active (11/12/2021)   Exercise Vital Sign    Days of Exercise per Week: 2 days    Minutes of Exercise per Session: 20 min  Stress: No Stress Concern Present (11/12/2021)   [place]    Feeling of Stress : Not at all  Social Connections: Moderately Integrated (11/12/2021)   Social Connection and Isolation Panel [NHANES]    Frequency of Communication with Friends and Family: Twice a week    Frequency of Social Gatherings with Friends and Family: Twice a week    Attends Religious Services: More than 4 times per year    Active Member of [employer] or Organizations: No    Attends Archivist Meetings: Never    Marital Status: Married  Human resources officer Violence: Not At Risk (11/12/2021)   Humiliation, Afraid, Rape, and Kick questionnaire    Fear of Current or Ex-Partner: No    Emotionally Abused: No    Physically Abused: No    Sexually Abused: No    Family History  Problem Relation Age of  Onset   Breast cancer Mother    Diabetes Mother    Prostate cancer Father    Colon cancer Neg Hx    Colon polyps Neg Hx    Esophageal cancer Neg Hx    Rectal cancer Neg Hx    Stomach cancer Neg Hx    Ulcerative colitis Neg Hx    Crohn's disease Neg Hx      Review of Systems  Constitutional:  Positive for malaise/fatigue. Negative for chills and fever.  HENT:  Positive for congestion and sore throat.   Respiratory:  Positive for cough and sputum production. Negative for hemoptysis, shortness of breath and wheezing.   Cardiovascular:  Negative for chest pain and palpitations.  Gastrointestinal:  Negative for abdominal pain, diarrhea, nausea and vomiting.  Genitourinary: Negative.   Skin:  Negative for rash.  Neurological:  Negative for dizziness and headaches.  All other systems reviewed and are negative.   Today's Vitals   04/18/22 0847  BP: 134/68  Pulse: 71  Temp: 98.4 F (36.9 C)  TempSrc: Oral  SpO2: 97%  Weight: 243 lb 2 oz (110.3 kg)  Height: 6' (1.829 m)   Body mass index is 32.97 kg/m.  Physical Exam Vitals reviewed.  Constitutional:      Appearance: He is well-developed.  HENT:     Head: Normocephalic.     Right Ear: Tympanic membrane, ear canal and external ear normal.     Left Ear: Tympanic membrane, ear canal and external ear normal.     Mouth/Throat:     Mouth: Mucous membranes are moist.     Pharynx: Oropharynx is clear. No oropharyngeal exudate or posterior oropharyngeal erythema.  Eyes:     Extraocular Movements: Extraocular movements intact.     Conjunctiva/sclera: Conjunctivae normal.     Pupils: Pupils are equal, round, and reactive to light.  Cardiovascular:     Rate and Rhythm: Normal rate and regular rhythm.     Pulses: Normal pulses.     Heart sounds: Murmur (Systolic 3/6 aortic area) heard.  Pulmonary:     Effort: Pulmonary effort is normal.     Breath sounds: Normal breath sounds.  Musculoskeletal:     Cervical back: No  tenderness.  Right lower leg: No edema.     Left lower leg: No edema.  Lymphadenopathy:     Cervical: No cervical adenopathy.  Skin:    General: Skin is warm and dry.     Capillary Refill: Capillary refill takes less than 2 seconds.  Neurological:     General: No focal deficit present.     Mental Status: He is alert and oriented to person, place, and time.  Psychiatric:        Mood and Affect: Mood normal.        Behavior: Behavior normal.   Results for orders placed or performed in visit on 04/18/22 (from the past 24 hour(s))  POC COVID-19     Status: Abnormal   Collection Time: 04/18/22  9:32 AM  Result Value Ref Range   SARS Coronavirus 2 Ag Positive (A) Negative     ASSESSMENT & PLAN: Problem List Items Addressed This Visit       Other   COVID-19 virus infection - Primary    Clinically stable.  No complications.  No red flag signs or symptoms. Immune system responding well. May benefit from Paxlovid for 5 days Advised to stay well-hydrated and rest ED precautions given COVID advice given Advised to contact the office if no better or worse during the next several days.      Relevant Medications   nirmatrelvir/ritonavir EUA (PAXLOVID) 20 x 150 MG & 10 x 100MG TABS   Acute cough    Recommend over-the-counter Mucinex DM Tessalon 200 mg 3 times a day as needed and Hycodan syrup as needed Advised to stay well-hydrated and rest Cough drops may help.      Relevant Medications   benzonatate (TESSALON) 200 MG capsule   HYDROcodone bit-homatropine (HYCODAN) 5-1.5 MG/5ML syrup   Other Relevant Orders   POC COVID-19 (Completed)   Other Visit Diagnoses     Sore throat       Relevant Orders   POC COVID-19 (Completed)   Flu-like symptoms          Patient Instructions  [id number] COVID-19, or coronavirus disease 2019, is an infection that is caused by a new (novel) coronavirus called SARS-CoV-2. COVID-19 can cause many symptoms. In some people, the virus may not  cause any symptoms. In others, it may cause mild or severe symptoms. Some people with severe infection develop severe disease. What are the causes? This illness is caused by a virus. The virus may be in the air as tiny specks of fluid (aerosols) or droplets, or it may be on surfaces. You may catch the virus by: Breathing in droplets from an infected person. Droplets can be spread by a person breathing, speaking, singing, coughing, or sneezing. Touching something, like a table or a doorknob, that has virus on it (is contaminated) and then touching your mouth, nose, or eyes. What increases the risk? Risk for infection: You are more likely to get infected with the COVID-19 virus if: You are within 6 ft (1.8 m) of a person with COVID-19 for 15 minutes or longer. You are providing care for a person who is infected with COVID-19. You are in close personal contact with other people. Close personal contact includes hugging, kissing, or sharing eating or drinking utensils. Risk for serious illness caused by COVID-19: You are more likely to get seriously ill from the COVID-19 virus if: You have cancer. You have a long-term (chronic) disease, such as: Chronic lung disease. This includes pulmonary embolism, chronic obstructive pulmonary disease, and  cystic fibrosis. Long-term disease that lowers your body's ability to fight infection (immunocompromise). Serious cardiac conditions, such as heart failure, coronary artery disease, or cardiomyopathy. Diabetes. Chronic kidney disease. Liver diseases. These include cirrhosis, nonalcoholic fatty liver disease, alcoholic liver disease, or autoimmune hepatitis. You have obesity. You are pregnant or were recently pregnant. You have sickle cell disease. What are the signs or symptoms? Symptoms of this condition can range from mild to severe. Symptoms may appear any time from 2 to 14 days after being exposed to the virus. They include: Fever or chills. Shortness  of breath or trouble breathing. Feeling tired or very tired. Headaches, body aches, or muscle aches. Runny or stuffy nose, sneezing, coughing, or sore throat. New loss of taste or smell. This is rare. Some people may also have stomach problems, such as nausea, vomiting, or diarrhea. Other people may not have any symptoms of COVID-19. How is this diagnosed? This condition may be diagnosed by testing samples to check for the COVID-19 virus. The most common tests are the PCR test and the antigen test. Tests may be done in the lab or at home. They include: Using a swab to take a sample of fluid from the back of your nose and throat (nasopharyngeal fluid), from your nose, or from your throat. Testing a sample of saliva from your mouth. Testing a sample of coughed-up mucus from your lungs (sputum). How is this treated? Treatment for COVID-19 infection depends on the severity of the condition. Mild symptoms can be managed at home with rest, fluids, and over-the-counter medicines. Serious symptoms may be treated in a hospital intensive care unit (ICU). Treatment in the ICU may include: Supplemental oxygen. Extra oxygen is given through a tube in the nose, a face mask, or a hood. Medicines. These may include: Antivirals, such as monoclonal antibodies. These help your body fight off certain viruses that can cause disease. Anti-inflammatories, such as corticosteroids. These reduce inflammation and suppress the immune system. Antithrombotics. These prevent or treat blood clots, if they develop. Convalescent plasma. This helps boost your immune system, if you have an underlying immunosuppressive condition or are getting immunosuppressive treatments. Prone positioning. This means you will lie on your stomach. This helps oxygen to get into your lungs. Infection control measures. If you are at risk for more serious illness caused by COVID-19, your health care provider may prescribe two long-acting monoclonal  antibodies, given together every 6 months. How is this prevented? To protect yourself: Use preventive medicine (pre-exposure prophylaxis). You may get pre-exposure prophylaxis if you have moderate or severe immunocompromise. Get vaccinated. Anyone 65 months old or older who meets guidelines can get a COVID-19 vaccine or vaccine series. This includes people who are pregnant or making breast milk (lactating). Get an added dose of COVID-19 vaccine after your first vaccine or vaccine series if you have moderate to severe immunocompromise. This applies if you have had a solid organ transplant or have been diagnosed with an immunocompromising condition. You should get the added dose 4 weeks after you got the first COVID-19 vaccine or vaccine series. If you get an mRNA vaccine, you will need a 3-dose primary series. If you get the J&J/Janssen vaccine, you will need a 2-dose primary series, with the second dose being an mRNA vaccine. Talk to your health care provider about getting experimental monoclonal antibodies. This treatment is approved under emergency use authorization to prevent severe illness before or after being exposed to the COVID-19 virus. You may be given monoclonal antibodies if:  You have moderate or severe immunocompromise. This includes treatments that lower your immune response. People with immunocompromise may not develop protection against COVID-19 when they are vaccinated. You cannot be vaccinated. You may not get a vaccine if you have a severe allergic reaction to the vaccine or its components. You are not fully vaccinated. You are in a facility where COVID-19 is present and: Are in close contact with a person who is infected with the COVID-19 virus. Are at high risk of being exposed to the COVID-19 virus. You are at risk of illness from new variants of the COVID-19 virus. To protect others: If you have symptoms of COVID-19, take steps to prevent the virus from spreading to  others. Stay home. Leave your house only to get medical care. Do not use public transit, if possible. Do not travel while you are sick. Wash your hands often with soap and water for at least 20 seconds. If soap and water are not available, use alcohol-based hand sanitizer. Make sure that all people in your household wash their hands well and often. Cough or sneeze into a tissue or your sleeve or elbow. Do not cough or sneeze into your hand or into the air. Where to find more information Centers for Disease Control and Prevention: [employer]arm[employer].be World Health Organization: https://www.castaneda.info/ Get help right away if: You have trouble breathing. You have pain or pressure in your chest. You are confused. You have bluish lips and fingernails. You have trouble waking from sleep. You have symptoms that get worse. These symptoms may be an emergency. Get help right away. Call 911. Do not wait to see if the symptoms will go away. Do not drive yourself to the hospital. Summary COVID-19 is an infection that is caused by a new coronavirus. Sometimes, there are no symptoms. Other times, symptoms range from mild to severe. Some people with a severe COVID-19 infection develop severe disease. The virus that causes COVID-19 can spread from person to person through droplets or aerosols from breathing, speaking, singing, coughing, or sneezing. Mild symptoms of COVID-19 can be managed at home with rest, fluids, and over-the-counter medicines. This information is not intended to replace advice given to you by your health care provider. Make sure you discuss any questions you have with your health care provider. Document Revised: 07/05/2021 Document Reviewed: 07/05/2021 Elsevier Patient Education  [place], MD [facility]

## 2022-04-19 ENCOUNTER — Other Ambulatory Visit: Payer: Self-pay | Admitting: Emergency Medicine

## 2022-04-19 DIAGNOSIS — M159 Polyosteoarthritis, unspecified: Secondary | ICD-10-CM

## 2022-04-19 DIAGNOSIS — Z8739 Personal history of other diseases of the musculoskeletal system and connective tissue: Secondary | ICD-10-CM

## 2022-04-22 ENCOUNTER — Encounter: Payer: Medicare Other | Admitting: Gastroenterology

## 2022-05-03 ENCOUNTER — Encounter: Payer: Medicare Other | Admitting: Gastroenterology

## 2022-05-04 ENCOUNTER — Other Ambulatory Visit: Payer: Self-pay | Admitting: Cardiovascular Disease

## 2022-05-07 ENCOUNTER — Ambulatory Visit (AMBULATORY_SURGERY_CENTER): Payer: Self-pay

## 2022-05-07 VITALS — Ht 72.0 in | Wt 238.0 lb

## 2022-05-07 DIAGNOSIS — Z86010 Personal history of colonic polyps: Secondary | ICD-10-CM

## 2022-05-07 NOTE — Progress Notes (Signed)
No egg or soy allergy known to patient  No issues known to pt with past sedation with any surgeries or procedures Patient denies ever being told they had issues or difficulty with intubation  No FH of Malignant Hyperthermia Pt is not on diet pills Pt is not on  home 02  Pt is not on blood thinners  Pt denies issues with constipation  No A fib or A flutter Have any cardiac testing pending--denied Pt instructed to use Singlecare.com or GoodRx for a price reduction on prep   PV over phone. Instructions mailed to verified address. (Pt already has Suprep from [facility] last month. Colonoscopy was r/s d/t covid.

## 2022-05-27 ENCOUNTER — Ambulatory Visit (AMBULATORY_SURGERY_CENTER): Payer: Medicare Other | Admitting: Gastroenterology

## 2022-05-27 ENCOUNTER — Encounter: Payer: Self-pay | Admitting: Gastroenterology

## 2022-05-27 VITALS — BP 116/65 | HR 62 | Temp 98.9°F | Resp 16 | Ht 72.0 in | Wt 238.0 lb

## 2022-05-27 DIAGNOSIS — Z86010 Personal history of colonic polyps: Secondary | ICD-10-CM | POA: Diagnosis not present

## 2022-05-27 DIAGNOSIS — D123 Benign neoplasm of transverse colon: Secondary | ICD-10-CM | POA: Diagnosis not present

## 2022-05-27 DIAGNOSIS — D128 Benign neoplasm of rectum: Secondary | ICD-10-CM

## 2022-05-27 DIAGNOSIS — D124 Benign neoplasm of descending colon: Secondary | ICD-10-CM | POA: Diagnosis not present

## 2022-05-27 DIAGNOSIS — Z09 Encounter for follow-up examination after completed treatment for conditions other than malignant neoplasm: Secondary | ICD-10-CM | POA: Diagnosis not present

## 2022-05-27 HISTORY — PX: COLONOSCOPY: SHX174

## 2022-05-27 MED ORDER — SODIUM CHLORIDE 0.9 % IV SOLN: 500.0000 mL | Freq: Once | INTRAVENOUS | Status: DC

## 2022-05-27 NOTE — Progress Notes (Signed)
Pt resting comfortably. VSS. Airway intact. SBAR complete to RN. All questions answered.   

## 2022-05-27 NOTE — Progress Notes (Signed)
History & Physical  Primary Care Physician:  Horald Pollen, MD Primary Gastroenterologist: Lucio Edward, MD  CHIEF COMPLAINT:  Personal history of colon polyps   HPI: Samuel Chambers is a 71 y.o. male with a personal history of adenomatous colon polyps for surveillance colonoscopy.   Past Medical History:  Diagnosis Date   Adenomatous colon polyp 03/2005   Anxiety    CAD S/P percutaneous coronary angioplasty 2008   PCI to circumflex with a Promus DES 2.5 mm x 8 mm   Cataract    BILATERAL,REMOVED   Degenerative joint disease    Diabetes mellitus without complication (HCC)    diet controlled,PT.DENIES UPDATED 04/10/22   Diverticulosis    Fatty liver 2016   pt denies   Fibromyalgia    GERD (gastroesophageal reflux disease)    Heart murmur    Phreesia 07/29/2020   Hyperlipidemia    statin intolerant   Hypertension    Internal hemorrhoids    Obesity, Class II, BMI 35-39.9    Pneumonia 2016   PONV (postoperative nausea and vomiting)     Past Surgical History:  Procedure Laterality Date   CHOLECYSTECTOMY N/A 02/18/2013   Procedure: LAPAROSCOPIC CHOLECYSTECTOMY WITH INTRAOPERATIVE CHOLANGIOGRAM;  Surgeon: Pedro Earls, MD;  Location: [facility];  Service: General;  Laterality: N/A;   COLONOSCOPY     COLONOSCOPY  05/27/2022   CORONARY ANGIOPLASTY WITH STENT PLACEMENT  04/30/2007   2.5 mm Promus stent that was to 95% Circ;had 60-70% lesion to RCA   DOPPLER ECHOCARDIOGRAPHY  05/27/2002   [facility]normal EF 55-66%,   FOOT SURGERY Right    x2 fascia   HEEL SPUR SURGERY Right    HEMORRHOID SURGERY     Holter Monitor  04/07/2007   sinus tachy.;   JOINT REPLACEMENT N/A    Phreesia 07/29/2020   KNEE ARTHROSCOPY  03/27/2012   Procedure: ARTHROSCOPY KNEE;  Surgeon: Gearlean Alf, MD;  Location: [facility];  Service: Orthopedics;  Laterality: Left;   NM MYOCAR PERF WALL MOTION  12/19/2011   EXERCISED FOR 8-1/2 MINUTES RECHING 10 METABOLIC EQUIVALENTS. NO EVIDENCE   OF ISCHEMIA OR INFARCTION . EF 71%   POLYPECTOMY     renal dopplers  04/08/2007   relatively normal   TOTAL HIP ARTHROPLASTY Right 03/16/2018   Procedure: RIGHT TOTAL HIP ARTHROPLASTY ANTERIOR APPROACH;  Surgeon: Gaynelle Arabian, MD;  Location: [facility];  Service: Orthopedics;  Laterality: Right;   TOTAL HIP ARTHROPLASTY Left about 10 to 12 years ago   TOTAL KNEE ARTHROPLASTY Left 09/04/2012   Procedure: TOTAL KNEE ARTHROPLASTY;  Surgeon: Gearlean Alf, MD;  Location: [facility];  Service: Orthopedics;  Laterality: Left;   TOTAL KNEE ARTHROPLASTY Right 01/24/2020   Procedure: TOTAL KNEE ARTHROPLASTY;  Surgeon: Gaynelle Arabian, MD;  Location: [facility];  Service: Orthopedics;  Laterality: Right;  18mn    Prior to Admission medications   Medication Sig Start Date End Date Taking? Authorizing Provider  aspirin EC 81 MG tablet Take 1 tablet (81 mg total) by mouth daily. Swallow whole. 06/13/20  Yes Croitoru, Mihai, MD  DULoxetine (CYMBALTA) 60 MG capsule TAKE 1 CAPSULE (60 MG) BY MOUTH DAILY 03/01/22  Yes Sagardia, MPico Rivera MD  hydrochlorothiazide (MICROZIDE) 12.5 MG capsule TAKE 1 CAPSULE BY MOUTH EVERY DAY 05/06/22  Yes Croitoru, Mihai, MD  losartan (COZAAR) 100 MG tablet TAKE 1 TABLET BY MOUTH EVERY DAY 11/05/21  Yes Croitoru, Mihai, MD  metoprolol succinate (TOPROL-XL) 50 MG 24 hr tablet TAKE 1 TABLET  BY MOUTH DAILY WITH OR IMMEDIATELY FOLLOWING A MEAL 07/31/21  Yes Croitoru, Mihai, MD  meloxicam (MOBIC) 7.5 MG tablet TAKE 1 TABLET BY MOUTH EVERY DAY 04/19/22   Horald Pollen, MD    Current Outpatient Medications  Medication Sig Dispense Refill   aspirin EC 81 MG tablet Take 1 tablet (81 mg total) by mouth daily. Swallow whole. 90 tablet 3   DULoxetine (CYMBALTA) 60 MG capsule TAKE 1 CAPSULE (60 MG) BY MOUTH DAILY 90 capsule 1   hydrochlorothiazide (MICROZIDE) 12.5 MG capsule TAKE 1 CAPSULE BY MOUTH EVERY DAY 90 capsule 0   losartan (COZAAR) 100 MG tablet TAKE 1 TABLET BY MOUTH EVERY DAY 90  tablet 3   metoprolol succinate (TOPROL-XL) 50 MG 24 hr tablet TAKE 1 TABLET BY MOUTH DAILY WITH OR IMMEDIATELY FOLLOWING A MEAL 90 tablet 3   meloxicam (MOBIC) 7.5 MG tablet TAKE 1 TABLET BY MOUTH EVERY DAY 30 tablet 1   Current Facility-Administered Medications  Medication Dose Route Frequency Provider Last Rate Last Admin   0.9 %  sodium chloride infusion  500 mL Intravenous Once Ladene Artist, MD        Allergies as of 05/27/2022 - Review Complete 05/27/2022  Allergen Reaction Noted   Crestor [rosuvastatin] Other (See Comments) 04/28/2013   Valsartan Other (See Comments) 11/09/2015   Warfarin and related Other (See Comments) 01/21/2012    Family History  Problem Relation Age of Onset   Breast cancer Mother    Diabetes Mother    Prostate cancer Father    Colon cancer Neg Hx    Colon polyps Neg Hx    Esophageal cancer Neg Hx    Rectal cancer Neg Hx    Stomach cancer Neg Hx    Ulcerative colitis Neg Hx    Crohn's disease Neg Hx     Social History   Socioeconomic History   Marital status: Married    Spouse name: Not on file   Number of children: 1   Years of education: Not on file   Highest education level: Not on file  Occupational History    Employer: [employer]  Tobacco Use   Smoking status: Never    Passive exposure: Past (PARENTS SMOKED)   Smokeless tobacco: Never  Vaping Use   Vaping Use: Never used  Substance and Sexual Activity   Alcohol use: No   Drug use: No   Sexual activity: Not on file  Other Topics Concern   Not on file  Social History Narrative   Very little 0-2 drinks a week   Social Determinants of Health   Financial Resource Strain: Low Risk  (11/12/2021)   Overall Financial Resource Strain (CARDIA)    Difficulty of Paying Living Expenses: Not hard at all  Food Insecurity: No Food Insecurity (11/12/2021)   Hunger Vital Sign    Worried About Running Out of Food in the Last Year: Never true    Ran Out of Food in the Last Year: Never  true  Transportation Needs: No Transportation Needs (11/12/2021)   PRAPARE - Hydrologist (Medical): No    Lack of Transportation (Non-Medical): No  Physical Activity: Insufficiently Active (11/12/2021)   Exercise Vital Sign    Days of Exercise per Week: 2 days    Minutes of Exercise per Session: 20 min  Stress: No Stress Concern Present (11/12/2021)   Teresita    Feeling of Stress : Not  at all  Social Connections: Moderately Integrated (11/12/2021)   Social Connection and Isolation Panel [NHANES]    Frequency of Communication with Friends and Family: Twice a week    Frequency of Social Gatherings with Friends and Family: Twice a week    Attends Religious Services: More than 4 times per year    Active Member of [employer] or Organizations: No    Attends Archivist Meetings: Never    Marital Status: Married  Human resources officer Violence: Not At Risk (11/12/2021)   Humiliation, Afraid, Rape, and Kick questionnaire    Fear of Current or Ex-Partner: No    Emotionally Abused: No    Physically Abused: No    Sexually Abused: No    Review of Systems:  All systems reviewed were negative except where noted in HPI.   Physical Exam: General:  Alert, well-developed, in NAD Head:  Normocephalic and atraumatic. Eyes:  Sclera clear, no icterus.   Conjunctiva pink. Ears:  Normal auditory acuity. Mouth:  No deformity or lesions.  Neck:  Supple; no masses . Lungs:  Clear throughout to auscultation.   No wheezes, crackles, or rhonchi. No acute distress. Heart:  Regular rate and rhythm; no murmurs. Abdomen:  Soft, nondistended, nontender. No masses, hepatomegaly. No obvious masses.  Normal bowel .    Rectal:  Deferred   Msk:  Symmetrical without gross deformities.. Pulses:  Normal pulses noted. Extremities:  Without edema. Neurologic:  Alert and  oriented x4;  grossly normal neurologically. Skin:   Intact without significant lesions or rashes. Cervical Nodes:  No significant cervical adenopathy. Psych:  Alert and cooperative. Normal mood and affect.   Impression / Plan:   Personal history of adenomatous colon polyps for surveillance colonoscopy.  Pricilla Riffle. Fuller Plan  05/27/2022, 2:07 PM See Shea Evans, [facility], to contact our on call provider

## 2022-05-27 NOTE — Progress Notes (Signed)
Pt's states no medical or surgical changes since previsit or office visit. 

## 2022-05-27 NOTE — Progress Notes (Signed)
Called to room to assist during endoscopic procedure.  Patient ID and intended procedure confirmed with present staff. Received instructions for my participation in the procedure from the performing physician.  

## 2022-05-27 NOTE — Patient Instructions (Signed)
Handouts on diverticulosis and polyps given to patient. Await pathology results. Resume previous diet and continue present medications. Repeat colonoscopy for surveillance will be determined based off of pathology results.  YOU HAD AN ENDOSCOPIC PROCEDURE TODAY AT [facility]:   Refer to the procedure report that was given to you for any specific questions about what was found during the examination.  If the procedure report does not answer your questions, please call your gastroenterologist to clarify.  If you requested that your care partner not be given the details of your procedure findings, then the procedure report has been included in a sealed envelope for you to review at your convenience later.  YOU SHOULD EXPECT: Some feelings of bloating in the abdomen. Passage of more gas than usual.  Walking can help get rid of the air that was put into your GI tract during the procedure and reduce the bloating. If you had a lower endoscopy (such as a colonoscopy or flexible sigmoidoscopy) you may notice spotting of blood in your stool or on the toilet paper. If you underwent a bowel prep for your procedure, you may not have a normal bowel movement for a few days.  Please Note:  You might notice some irritation and congestion in your nose or some drainage.  This is from the oxygen used during your procedure.  There is no need for concern and it should clear up in a day or so.  SYMPTOMS TO REPORT IMMEDIATELY:  Following lower endoscopy (colonoscopy or flexible sigmoidoscopy):  Excessive amounts of blood in the stool  Significant tenderness or worsening of abdominal pains  Swelling of the abdomen that is new, acute  Fever of 100F or higher  For urgent or emergent issues, a gastroenterologist can be reached at any hour by calling [phone]. Do not use MyChart messaging for urgent concerns.    DIET:  We do recommend a small meal at first, but then you may proceed to your regular  diet.  Drink plenty of fluids but you should avoid alcoholic beverages for 24 hours.  ACTIVITY:  You should plan to take it easy for the rest of today and you should NOT DRIVE or use heavy machinery until tomorrow (because of the sedation medicines used during the test).    FOLLOW UP: Our staff will call the number listed on your records the next business day following your procedure.  We will call around 7:15- 8:00 am to check on you and address any questions or concerns that you may have regarding the information given to you following your procedure. If we do not reach you, we will leave a message.     If any biopsies were taken you will be contacted by phone or by letter within the next 1-3 weeks.  Please call us at [phone] if you have not heard about the biopsies in 3 weeks.    SIGNATURES/CONFIDENTIALITY: You and/or your care partner have signed paperwork which will be entered into your electronic medical record.  These signatures attest to the fact that that the information above on your After Visit Summary has been reviewed and is understood.  Full responsibility of the confidentiality of this discharge information lies with you and/or your care-partner.

## 2022-05-27 NOTE — Op Note (Signed)
[facility] Patient Name: Samuel Chambers Procedure Date: 05/27/2022 2:12 PM MRN: [id number] Endoscopist: Ladene Artist , MD, [id number] Age: 71 Referring MD:  Date of Birth: 01/12/1951 Gender: Male Account #: [id number] Procedure:                Colonoscopy Indications:              Surveillance: Personal history of adenomatous                            polyps on last colonoscopy > 5 years ago Medicines:                Monitored Anesthesia Care Procedure:                Pre-Anesthesia Assessment:                           - Prior to the procedure, a History and Physical                            was performed, and patient medications and                            allergies were reviewed. The patient's tolerance of                            previous anesthesia was also reviewed. The risks                            and benefits of the procedure and the sedation                            options and risks were discussed with the patient.                            All questions were answered, and informed consent                            was obtained. Prior Anticoagulants: The patient has                            taken no anticoagulant or antiplatelet agents. ASA                            Grade Assessment: III - A patient with severe                            systemic disease. After reviewing the risks and                            benefits, the patient was deemed in satisfactory                            condition to undergo the procedure.  After obtaining informed consent, the colonoscope                            was passed under direct vision. Throughout the                            procedure, the patient's blood pressure, pulse, and                            oxygen saturations were monitored continuously. The                            CF [id number] #[id number] was introduced through the anus                            and advanced to  the the cecum, identified by                            appendiceal orifice and ileocecal valve. The                            ileocecal valve, appendiceal orifice, and rectum                            were photographed. The quality of the bowel                            preparation was good. The colonoscopy was performed                            without difficulty. The patient tolerated the                            procedure well. Scope In: 2:17:36 PM Scope Out: 2:39:15 PM Scope Withdrawal Time: 0 hours 18 minutes 48 seconds  Total Procedure Duration: 0 hours 21 minutes 39 seconds  Findings:                 The perianal and digital rectal examinations were                            normal.                           A 4 mm polyp was found in the rectum. The polyp was                            sessile. The polyp was removed with a cold biopsy                            forceps. Resection and retrieval were complete.                           Four sessile polyps were found in the descending  colon (1) and transverse colon (3). The polyps were                            7 to 8 mm in size. These polyps were removed with a                            cold snare. Resection and retrieval were complete.                           There was a medium-sized lipoma, 15 mm in diameter,                            in the transverse colon.                           A few small-mouthed diverticula were found in the                            left colon.                           Internal hemorrhoids were found during                            retroflexion. The hemorrhoids were moderate and                            Grade I (internal hemorrhoids that do not prolapse).                           The exam was otherwise without abnormality on                            direct and retroflexion views. Complications:            No immediate complications. Estimated blood loss:                             None. Estimated Blood Loss:     Estimated blood loss: none. Impression:               - One 4 mm polyp in the rectum, removed with a cold                            biopsy forceps. Resected and retrieved.                           - Four 7 to 8 mm polyps in the descending colon and                            in the transverse colon, removed with a cold snare.                            Resected and retrieved.                           -  Transverse colon lipoma.                           - Mild diverticulosis in the left colon.                           - Internal hemorrhoids.                           - The examination was otherwise normal on direct                            and retroflexion views. Recommendation:           - Repeat colonoscopy after studies are complete for                            surveillance based on pathology results.                           - Patient has a contact number available for                            emergencies. The signs and symptoms of potential                            delayed complications were discussed with the                            patient. Return to normal activities tomorrow.                            Written discharge instructions were provided to the                            patient.                           - Resume previous diet.                           - Continue present medications.                           - Await pathology results. Ladene Artist, MD 05/27/2022 2:46:22 PM This report has been signed electronically.

## 2022-05-28 ENCOUNTER — Telehealth: Payer: Self-pay | Admitting: *Deleted

## 2022-05-28 NOTE — Telephone Encounter (Signed)
  Follow up Call-     05/27/2022    1:14 PM  Call back number  Post procedure Call Back phone  # [phone]  Permission to leave phone message Yes     Patient questions:  Do you have a fever, pain , or abdominal swelling? No. Pain Score  0 *  Have you tolerated food without any problems? Yes.    Have you been able to return to your normal activities? Yes.    Do you have any questions about your discharge instructions: Diet   No. Medications  No. Follow up visit  No.  Do you have questions or concerns about your Care? No.  Actions: * If pain score is 4 or above: No action needed, pain <4.

## 2022-06-03 ENCOUNTER — Other Ambulatory Visit: Payer: Self-pay | Admitting: Emergency Medicine

## 2022-06-03 ENCOUNTER — Other Ambulatory Visit: Payer: Self-pay | Admitting: Cardiovascular Disease

## 2022-06-03 DIAGNOSIS — Z8739 Personal history of other diseases of the musculoskeletal system and connective tissue: Secondary | ICD-10-CM

## 2022-06-03 DIAGNOSIS — M159 Polyosteoarthritis, unspecified: Secondary | ICD-10-CM

## 2022-06-06 ENCOUNTER — Encounter: Payer: Self-pay | Admitting: Gastroenterology

## 2022-07-27 DIAGNOSIS — M65342 Trigger finger, left ring finger: Secondary | ICD-10-CM | POA: Insufficient documentation

## 2022-07-27 DIAGNOSIS — M19049 Primary osteoarthritis, unspecified hand: Secondary | ICD-10-CM | POA: Insufficient documentation

## 2022-08-15 ENCOUNTER — Ambulatory Visit (INDEPENDENT_AMBULATORY_CARE_PROVIDER_SITE_OTHER): Payer: Medicare Other | Admitting: Emergency Medicine

## 2022-08-15 ENCOUNTER — Encounter: Payer: Self-pay | Admitting: Emergency Medicine

## 2022-08-15 VITALS — BP 128/78 | HR 80 | Temp 97.6°F | Ht 72.0 in | Wt 259.0 lb

## 2022-08-15 DIAGNOSIS — Z9861 Coronary angioplasty status: Secondary | ICD-10-CM | POA: Diagnosis not present

## 2022-08-15 DIAGNOSIS — I10 Essential (primary) hypertension: Secondary | ICD-10-CM

## 2022-08-15 DIAGNOSIS — M159 Polyosteoarthritis, unspecified: Secondary | ICD-10-CM

## 2022-08-15 DIAGNOSIS — R7303 Prediabetes: Secondary | ICD-10-CM

## 2022-08-15 DIAGNOSIS — K219 Gastro-esophageal reflux disease without esophagitis: Secondary | ICD-10-CM | POA: Diagnosis not present

## 2022-08-15 DIAGNOSIS — I2510 Atherosclerotic heart disease of native coronary artery without angina pectoris: Secondary | ICD-10-CM | POA: Diagnosis not present

## 2022-08-15 DIAGNOSIS — F411 Generalized anxiety disorder: Secondary | ICD-10-CM

## 2022-08-15 DIAGNOSIS — E785 Hyperlipidemia, unspecified: Secondary | ICD-10-CM

## 2022-08-15 LAB — LIPID PANEL
Cholesterol: 208 mg/dL — ABNORMAL HIGH (ref 0–200)
HDL: 45.4 mg/dL (ref >39.00)
LDL Cholesterol: 133 mg/dL — ABNORMAL HIGH (ref 0–99)
NonHDL: 162.66
Total CHOL/HDL Ratio: 5
Triglycerides: 150 mg/dL — ABNORMAL HIGH (ref 0.0–149.0)
VLDL: 30 mg/dL (ref 0.0–40.0)

## 2022-08-15 LAB — COMPREHENSIVE METABOLIC PANEL
ALT: 35 U/L (ref 0–53)
AST: 24 U/L (ref 0–37)
Albumin: 4.5 g/dL (ref 3.5–5.2)
Alkaline Phosphatase: 55 U/L (ref 39–117)
BUN: 19 mg/dL (ref 6–23)
CO2: 31 mEq/L (ref 19–32)
Calcium: 9.6 mg/dL (ref 8.4–10.5)
Chloride: 100 mEq/L (ref 96–112)
Creatinine, Ser: 1.1 mg/dL (ref 0.40–1.50)
GFR: 67.56 mL/min (ref >60.00)
Glucose, Bld: 99 mg/dL (ref 70–99)
Potassium: 4.4 mEq/L (ref 3.5–5.1)
Sodium: 139 mEq/L (ref 135–145)
Total Bilirubin: 0.4 mg/dL (ref 0.2–1.2)
Total Protein: 7.4 g/dL (ref 6.0–8.3)

## 2022-08-15 LAB — HEMOGLOBIN A1C: Hgb A1c MFr Bld: 6.5 % (ref 4.6–6.5)

## 2022-08-15 NOTE — Assessment & Plan Note (Signed)
No recent anginal episodes.  Stable.  Continue daily baby aspirin

## 2022-08-15 NOTE — Assessment & Plan Note (Signed)
Stable.  Takes meloxicam as needed.

## 2022-08-15 NOTE — Assessment & Plan Note (Signed)
Stable.  Takes Cymbalta 60 mg daily.

## 2022-08-15 NOTE — Progress Notes (Signed)
Samuel Chambers 72 y.o.   Chief Complaint  Patient presents with   Follow-up    6 month f/u    HISTORY OF PRESENT ILLNESS: This is a 72 y.o. male here for 69-monthfollow-up of hypertension. Overall doing well. Has no complaints or medical concerns today. BP Readings from Last 3 Encounters:  08/15/22 (!) 136/90  05/27/22 116/65  04/18/22 134/68     HPI   Prior to Admission medications   Medication Sig Start Date End Date Taking? Authorizing Provider  aspirin EC 81 MG tablet Take 1 tablet (81 mg total) by mouth daily. Swallow whole. 06/13/20  Yes Croitoru, Mihai, MD  DULoxetine (CYMBALTA) 60 MG capsule TAKE 1 CAPSULE (60 MG) BY MOUTH DAILY 03/01/22  Yes Javares Kaufhold, MInes Bloomer MD  hydrochlorothiazide (MICROZIDE) 12.5 MG capsule TAKE 1 CAPSULE BY MOUTH EVERY DAY 06/03/22  Yes Croitoru, Mihai, MD  losartan (COZAAR) 100 MG tablet TAKE 1 TABLET BY MOUTH EVERY DAY 11/05/21  Yes Croitoru, Mihai, MD  meloxicam (MOBIC) 7.5 MG tablet TAKE 1 TABLET BY MOUTH EVERY DAY 06/03/22  Yes Felice Deem, MInes Bloomer MD  metoprolol succinate (TOPROL-XL) 50 MG 24 hr tablet TAKE 1 TABLET BY MOUTH DAILY WITH OR IMMEDIATELY FOLLOWING A MEAL 07/31/21  Yes Croitoru, Mihai, MD    Allergies  Allergen Reactions   Crestor [Rosuvastatin] Other (See Comments)    myalgias   Valsartan Other (See Comments)    Made patient feel tired and no energy   Warfarin And Related Other (See Comments)    Fast heart beat, went down hill, felt like he was dying    Patient Active Problem List   Diagnosis Date Noted   Prediabetes 08/15/2022   History of gout 02/07/2021   Primary osteoarthritis of right knee 01/24/2020   OA (osteoarthritis) of hip 03/16/2018   Severe obesity (BMI 35.0-35.9 with comorbidity) [place] 08/04[place]   Dyslipidemia 04/29/2013   CAD S/P percutaneous coronary angioplasty    S/P laparoscopic cholecystectomy July 2014 02/18/2013   OA (osteoarthritis) of knee 09/04/2012   Anxiety state 02/24/2007    Essential hypertension 02/24/2007   GERD 02/24/2007   Osteoarthritis of multiple joints 02/24/2007    Past Medical History:  Diagnosis Date   Adenomatous colon polyp 03/2005   Anxiety    CAD S/P percutaneous coronary angioplasty 2008   PCI to circumflex with a Promus DES 2.5 mm x 8 mm   Cataract    BILATERAL,REMOVED   Degenerative joint disease    Di[place]    diet controlled,PT.DENIES UPDATED 04/10/22   Diverticulosis    Fatty liver 2016   pt denies   Fibromyalgia    GERD (gastroesophageal reflux disease)    Heart murmur    [employer] 07/29/2020   Hyperlipidemia    statin intolerant   Hypertension    Internal hemorrhoids    Obesity, Class II, BMI 35-39.9    Pneumonia 2016   PONV (postoperative nausea and vomiting)     Past Surgical History:  Procedure Laterality Date   CHOLECYSTECTOMY N/A 02/18/2013   Procedure: LAPAROSCOPIC CHOLECYSTECTOMY WITH INTRAOPERATIVE CHOLANGIOGRAM;  Surgeon: MPedro Earls MD;  Location: [facility];  Service: General;  Laterality: N/A;   COLONOSCOPY     COLONOSCOPY  05/27/2022   CORONARY ANGIOPLASTY WITH STENT PLACEMENT  04/30/2007   2.5 mm Promus stent that was to 95% Circ;had 60-70% lesion to RCA   DOPPLER ECHOCARDIOGRAPHY  05/27/2002   [facility].-normal EF 55-66%,   FOOT SURGERY Right  x2 fascia   HEEL SPUR SURGERY Right    HEMORRHOID SURGERY     Holter Monitor  04/07/2007   sinus tachy.;   JOINT REPLACEMENT N/A    [employer] 07/29/2020   KNEE ARTHROSCOPY  03/27/2012   Procedure: ARTHROSCOPY KNEE;  Surgeon: Gearlean Alf, MD;  Location: [facility];  Service: Orthopedics;  Laterality: Left;   NM MYOCAR PERF WALL MOTION  12/19/2011   EXERCISED FOR 8-1/2 MINUTES RECHING 10 METABOLIC EQUIVALENTS. NO EVIDENCE  OF ISCHEMIA OR INFARCTION . EF 71%   POLYPECTOMY     renal dopplers  04/08/2007   relatively normal   TOTAL HIP ARTHROPLASTY Right 03/16/2018   Procedure: RIGHT TOTAL HIP ARTHROPLASTY ANTERIOR  APPROACH;  Surgeon: Gaynelle Arabian, MD;  Location: [facility];  Service: Orthopedics;  Laterality: Right;   TOTAL HIP ARTHROPLASTY Left about 10 to 12 years ago   TOTAL KNEE ARTHROPLASTY Left 09/04/2012   Procedure: TOTAL KNEE ARTHROPLASTY;  Surgeon: Gearlean Alf, MD;  Location: [facility];  Service: Orthopedics;  Laterality: Left;   TOTAL KNEE ARTHROPLASTY Right 01/24/2020   Procedure: TOTAL KNEE ARTHROPLASTY;  Surgeon: Gaynelle Arabian, MD;  Location: [facility];  Service: Orthopedics;  Laterality: Right;  65mn    Social History   Socioeconomic History   Marital status: Married    Spouse name: Not on file   Number of children: 1   Years of education: Not on file   Highest education level: Not on file  Occupational History    Employer: [employer]  Tobacco Use   Smoking status: Never    Passive exposure: Past (PARENTS SMOKED)   Smokeless tobacco: Never  Vaping Use   Vaping Use: Never used  Substance and Sexual Activity   Alcohol use: No   Drug use: No   Sexual activity: Not on file  Other Topics Concern   Not on file  Social History Narrative   Very little 0-2 drinks a week   Social Determinants of Health   Financial Resource Strain: Low Risk  (11/12/2021)   Overall Financial Resource Strain (CARDIA)    Difficulty of Paying Living Expenses: Not hard at all  Food Insecurity: No Food Insecurity (11/12/2021)   Hunger Vital Sign    Worried About Running Out of Food in the Last Year: Never true    Ran Out of Food in the Last Year: Never true  Transportation Needs: No Transportation Needs (11/12/2021)   PRAPARE - THydrologist(Medical): No    Lack of Transportation (Non-Medical): No  Physical Activity: Insufficiently Active (11/12/2021)   Exercise Vital Sign    Days of Exercise per Week: 2 days    Minutes of Exercise per Session: 20 min  Stress: No Stress Concern Present (11/12/2021)   [place]   Feeling of Stress : Not at all  Social Connections: Moderately Integrated (11/12/2021)   Social Connection and Isolation Panel [NHANES]    Frequency of Communication with Friends and Family: Twice a week    Frequency of Social Gatherings with Friends and Family: Twice a week    Attends Religious Services: More than 4 times per year    Active Member of [employer] Organizations: No    Attends CArchivistMeetings: Never    Marital Status: Married  IHuman resources officerViolence: Not At Risk (11/12/2021)   Humiliation, Afraid, Rape, and Kick questionnaire    Fear of Current or Ex-Partner: No  Emotionally Abused: No    Physically Abused: No    Sexually Abused: No    Family History  Problem Relation Age of Onset   Breast cancer Mother    Diabetes Mother    Prostate cancer Father    Colon cancer Neg Hx    Colon polyps Neg Hx    Esophageal cancer Neg Hx    Rectal cancer Neg Hx    Stomach cancer Neg Hx    Ulcerative colitis Neg Hx    Crohn's disease Neg Hx      Review of Systems  Constitutional: Negative.  Negative for chills and fever.  HENT: Negative.  Negative for congestion and sore throat.   Respiratory: Negative.  Negative for cough and shortness of breath.   Cardiovascular: Negative.  Negative for chest pain and palpitations.  Gastrointestinal:  Negative for abdominal pain, diarrhea, nausea and vomiting.  Genitourinary: Negative.  Negative for dysuria and hematuria.  Skin: Negative.  Negative for rash.  Neurological: Negative.  Negative for dizziness and headaches.  All other systems reviewed and are negative.  Today's Vitals   08/15/22 0812 08/15/22 0832  BP: (!) 136/90 128/78  Pulse: 80   Temp: 97.6 F (36.4 C)   TempSrc: Temporal   SpO2: 96%   Weight: 259 lb (117.5 kg)   Height: 6' (1.829 m)    Body mass index is 35.13 kg/m.   Physical Exam Vitals reviewed.  Constitutional:      Appearance: Normal appearance.  HENT:     Head:  Normocephalic.  Eyes:     Extraocular Movements: Extraocular movements intact.     Pupils: Pupils are equal, round, and reactive to light.  Cardiovascular:     Rate and Rhythm: Normal rate and regular rhythm.     Pulses: Normal pulses.     Heart sounds: Murmur heard.  Pulmonary:     Effort: Pulmonary effort is normal.     Breath sounds: Normal breath sounds.  Abdominal:     Palpations: Abdomen is soft.  Musculoskeletal:        General: Normal range of motion.  Skin:    General: Skin is warm and dry.  Neurological:     General: No focal deficit present.     Mental Status: He is alert and oriented to person, place, and time.  Psychiatric:        Mood and Affect: Mood normal.        Behavior: Behavior normal.      ASSESSMENT & PLAN: A total of 45 minutes was spent with the patient and counseling/coordination of care regarding preparing for this visit, review of most recent office visit notes, review of multiple chronic medical conditions and their management, review of all medications, review of most recent blood work results, education on nutrition, cardiovascular risks associated with hypertension and dyslipidemia, prognosis, documentation, need for follow-up  Problem List Items Addressed This Visit       Cardiovascular and Mediastinum   Essential hypertension - Primary (Chronic)    Well-controlled hypertension. Continue losartan 100 mg, hydrochlorothiazide 12.5 mg, and metoprolol succinate 50 mg daily. Cardiovascular risk associated with hypertension discussed Dietary approaches to stop hypertension discussed Follow-up in 6 months      Relevant Orders   Comprehensive metabolic panel   CAD S/P percutaneous coronary angioplasty (Chronic)    No recent anginal episodes.  Stable.  Continue daily baby aspirin        Digestive   GERD (Chronic)    Stable and  asymptomatic.        Musculoskeletal and Integument   Osteoarthritis of multiple joints    Stable.  Takes  meloxicam as needed.        Other   Anxiety state    Stable.  Takes Cymbalta 60 mg daily.      Dyslipidemia   Relevant Orders   Lipid panel   Prediabetes    Stable.  Diet and nutrition discussed. Advised to decrease amount of daily carbohydrate intake and daily calories and increase amount of plant-based protein in his diet      Relevant Orders   Hemoglobin A1c   Patient Instructions  Hypertension, Adult High blood pressure (hypertension) is when the force of blood pumping through the arteries is too strong. The arteries are the blood vessels that carry blood from the heart throughout the body. Hypertension forces the heart to work harder to pump blood and may cause arteries to become narrow or stiff. Untreated or uncontrolled hypertension can lead to a heart attack, heart failure, a stroke, kidney disease, and other problems. A blood pressure reading consists of a higher number over a lower number. Ideally, your blood pressure should be below 120/80. The first ("top") number is called the systolic pressure. It is a measure of the pressure in your arteries as your heart beats. The second ("bottom") number is called the diastolic pressure. It is a measure of the pressure in your arteries as the heart relaxes. What are the causes? The exact cause of this condition is not known. There are some conditions that result in high blood pressure. What increases the risk? Certain factors may make you more likely to develop high blood pressure. Some of these risk factors are under your control, including: Smoking. Not getting enough exercise or physical activity. Being overweight. Having too much fat, sugar, calories, or salt (sodium) in your diet. Drinking too much alcohol. Other risk factors include: Having a personal history of heart disease, diabetes, high cholesterol, or kidney disease. Stress. Having a family history of high blood pressure and high cholesterol. Having obstructive sleep  apnea. Age. The risk increases with age. What are the signs or symptoms? High blood pressure may not cause symptoms. Very high blood pressure (hypertensive crisis) may cause: Headache. Fast or irregular heartbeats (palpitations). Shortness of breath. Nosebleed. Nausea and vomiting. Vision changes. Severe chest pain, dizziness, and seizures. How is this diagnosed? This condition is diagnosed by measuring your blood pressure while you are seated, with your arm resting on a flat surface, your legs uncrossed, and your feet flat on the floor. The cuff of the blood pressure monitor will be placed directly against the skin of your upper arm at the level of your heart. Blood pressure should be measured at least twice using the same arm. Certain conditions can cause a difference in blood pressure between your right and left arms. If you have a high blood pressure reading during one visit or you have normal blood pressure with other risk factors, you may be asked to: Return on a different day to have your blood pressure checked again. Monitor your blood pressure at home for 1 week or longer. If you are diagnosed with hypertension, you may have other blood or imaging tests to help your health care provider understand your overall risk for other conditions. How is this treated? This condition is treated by making healthy lifestyle changes, such as eating healthy foods, exercising more, and reducing your alcohol intake. You may be referred for counseling  on a healthy diet and physical activity. Your health care provider may prescribe medicine if lifestyle changes are not enough to get your blood pressure under control and if: Your systolic blood pressure is above 130. Your diastolic blood pressure is above 80. Your personal target blood pressure may vary depending on your medical conditions, your age, and other factors. Follow these instructions at home: Eating and drinking  Eat a diet that is high in  fiber and potassium, and low in sodium, added sugar, and fat. An example of this eating plan is called the DASH diet. DASH stands for Dietary Approaches to Stop Hypertension. To eat this way: Eat plenty of fresh fruits and vegetables. Try to fill one half of your plate at each meal with fruits and vegetables. Eat whole grains, such as whole-wheat pasta, brown rice, or whole-grain bread. Fill about one fourth of your plate with whole grains. Eat or drink low-fat dairy products, such as skim milk or low-fat yogurt. Avoid fatty cuts of meat, processed or cured meats, and poultry with skin. Fill about one fourth of your plate with lean proteins, such as fish, chicken without skin, beans, eggs, or tofu. Avoid pre-made and processed foods. These tend to be higher in sodium, added sugar, and fat. Reduce your daily sodium intake. Many people with hypertension should eat less than 1,500 mg of sodium a day. Do not drink alcohol if: Your health care provider tells you not to drink. You are pregnant, may be pregnant, or are planning to become pregnant. If you drink alcohol: Limit how much you have to: 0-1 drink a day for women. 0-2 drinks a day for men. Know how much alcohol is in your drink. In the [place] one drink equals one 12 oz bottle of beer (355 mL), one 5 oz glass of wine (148 mL), or one 1 oz glass of hard liquor (44 mL). Lifestyle  Work with your health care provider to maintain a healthy body weight or to lose weight. Ask what an ideal weight is for you. Get at least 30 minutes of exercise that causes your heart to beat faster (aerobic exercise) most days of the week. Activities may include walking, swimming, or biking. Include exercise to strengthen your muscles (resistance exercise), such as Pilates or lifting weights, as part of your weekly exercise routine. Try to do these types of exercises for 30 minutes at least 3 days a week. Do not use any products that contain nicotine or tobacco. These  products include cigarettes, chewing tobacco, and vaping devices, such as e-cigarettes. If you need help quitting, ask your health care provider. Monitor your blood pressure at home as told by your health care provider. Keep all follow-up visits. This is important. Medicines Take over-the-counter and prescription medicines only as told by your health care provider. Follow directions carefully. Blood pressure medicines must be taken as prescribed. Do not skip doses of blood pressure medicine. Doing this puts you at risk for problems and can make the medicine less effective. Ask your health care provider about side effects or reactions to medicines that you should watch for. Contact a health care provider if you: Think you are having a reaction to a medicine you are taking. Have headaches that keep coming back (recurring). Feel dizzy. Have swelling in your ankles. Have trouble with your vision. Get help right away if you: Develop a severe headache or confusion. Have unusual weakness or numbness. Feel faint. Have severe pain in your chest or abdomen. Vomit repeatedly.  Have trouble breathing. These symptoms may be an emergency. Get help right away. Call 911. Do not wait to see if the symptoms will go away. Do not drive yourself to the hospital. Summary Hypertension is when the force of blood pumping through your arteries is too strong. If this condition is not controlled, it may put you at risk for serious complications. Your personal target blood pressure may vary depending on your medical conditions, your age, and other factors. For most people, a normal blood pressure is less than 120/80. Hypertension is treated with lifestyle changes, medicines, or a combination of both. Lifestyle changes include losing weight, eating a healthy, low-sodium diet, exercising more, and limiting alcohol. This information is not intended to replace advice given to you by your health care provider. Make sure you  discuss any questions you have with your health care provider. Document Revised: 05/22/2021 Document Reviewed: 05/22/2021 Elsevier Patient Education  [place], MD [facility]

## 2022-08-15 NOTE — Assessment & Plan Note (Signed)
Stable.  Diet and nutrition discussed. Advised to decrease amount of daily carbohydrate intake and daily calories and increase amount of plant based protein in his diet. 

## 2022-08-15 NOTE — Assessment & Plan Note (Signed)
Well-controlled hypertension. Continue losartan 100 mg, hydrochlorothiazide 12.5 mg, and metoprolol succinate 50 mg daily. Cardiovascular risk associated with hypertension discussed Dietary approaches to stop hypertension discussed Follow-up in 6 months

## 2022-08-15 NOTE — Assessment & Plan Note (Signed)
Stable and asymptomatic  

## 2022-08-15 NOTE — Patient Instructions (Signed)
Hypertension, Adult High blood pressure (hypertension) is when the force of blood pumping through the arteries is too strong. The arteries are the blood vessels that carry blood from the heart throughout the body. Hypertension forces the heart to work harder to pump blood and may cause arteries to become narrow or stiff. Untreated or uncontrolled hypertension can lead to a heart attack, heart failure, a stroke, kidney disease, and other problems. A blood pressure reading consists of a higher number over a lower number. Ideally, your blood pressure should be below 120/80. The first ("top") number is called the systolic pressure. It is a measure of the pressure in your arteries as your heart beats. The second ("bottom") number is called the diastolic pressure. It is a measure of the pressure in your arteries as the heart relaxes. What are the causes? The exact cause of this condition is not known. There are some conditions that result in high blood pressure. What increases the risk? Certain factors may make you more likely to develop high blood pressure. Some of these risk factors are under your control, including: Smoking. Not getting enough exercise or physical activity. Being overweight. Having too much fat, sugar, calories, or salt (sodium) in your diet. Drinking too much alcohol. Other risk factors include: Having a personal history of heart disease, diabetes, high cholesterol, or kidney disease. Stress. Having a family history of high blood pressure and high cholesterol. Having obstructive sleep apnea. Age. The risk increases with age. What are the signs or symptoms? High blood pressure may not cause symptoms. Very high blood pressure (hypertensive crisis) may cause: Headache. Fast or irregular heartbeats (palpitations). Shortness of breath. Nosebleed. Nausea and vomiting. Vision changes. Severe chest pain, dizziness, and seizures. How is this diagnosed? This condition is diagnosed by  measuring your blood pressure while you are seated, with your arm resting on a flat surface, your legs uncrossed, and your feet flat on the floor. The cuff of the blood pressure monitor will be placed directly against the skin of your upper arm at the level of your heart. Blood pressure should be measured at least twice using the same arm. Certain conditions can cause a difference in blood pressure between your right and left arms. If you have a high blood pressure reading during one visit or you have normal blood pressure with other risk factors, you may be asked to: Return on a different day to have your blood pressure checked again. Monitor your blood pressure at home for 1 week or longer. If you are diagnosed with hypertension, you may have other blood or imaging tests to help your health care provider understand your overall risk for other conditions. How is this treated? This condition is treated by making healthy lifestyle changes, such as eating healthy foods, exercising more, and reducing your alcohol intake. You may be referred for counseling on a healthy diet and physical activity. Your health care provider may prescribe medicine if lifestyle changes are not enough to get your blood pressure under control and if: Your systolic blood pressure is above 130. Your diastolic blood pressure is above 80. Your personal target blood pressure may vary depending on your medical conditions, your age, and other factors. Follow these instructions at home: Eating and drinking  Eat a diet that is high in fiber and potassium, and low in sodium, added sugar, and fat. An example of this eating plan is called the DASH diet. DASH stands for Dietary Approaches to Stop Hypertension. To eat this way: Eat   plenty of fresh fruits and vegetables. Try to fill one half of your plate at each meal with fruits and vegetables. Eat whole grains, such as whole-wheat pasta, brown rice, or whole-grain bread. Fill about one  fourth of your plate with whole grains. Eat or drink low-fat dairy products, such as skim milk or low-fat yogurt. Avoid fatty cuts of meat, processed or cured meats, and poultry with skin. Fill about one fourth of your plate with lean proteins, such as fish, chicken without skin, beans, eggs, or tofu. Avoid pre-made and processed foods. These tend to be higher in sodium, added sugar, and fat. Reduce your daily sodium intake. Many people with hypertension should eat less than 1,500 mg of sodium a day. Do not drink alcohol if: Your health care provider tells you not to drink. You are pregnant, may be pregnant, or are planning to become pregnant. If you drink alcohol: Limit how much you have to: 0-1 drink a day for women. 0-2 drinks a day for men. Know how much alcohol is in your drink. In the [place] one drink equals one 12 oz bottle of beer (355 mL), one 5 oz glass of wine (148 mL), or one 1 oz glass of hard liquor (44 mL). Lifestyle  Work with your health care provider to maintain a healthy body weight or to lose weight. Ask what an ideal weight is for you. Get at least 30 minutes of exercise that causes your heart to beat faster (aerobic exercise) most days of the week. Activities may include walking, swimming, or biking. Include exercise to strengthen your muscles (resistance exercise), such as Pilates or lifting weights, as part of your weekly exercise routine. Try to do these types of exercises for 30 minutes at least 3 days a week. Do not use any products that contain nicotine or tobacco. These products include cigarettes, chewing tobacco, and vaping devices, such as e-cigarettes. If you need help quitting, ask your health care provider. Monitor your blood pressure at home as told by your health care provider. Keep all follow-up visits. This is important. Medicines Take over-the-counter and prescription medicines only as told by your health care provider. Follow directions carefully. Blood  pressure medicines must be taken as prescribed. Do not skip doses of blood pressure medicine. Doing this puts you at risk for problems and can make the medicine less effective. Ask your health care provider about side effects or reactions to medicines that you should watch for. Contact a health care provider if you: Think you are having a reaction to a medicine you are taking. Have headaches that keep coming back (recurring). Feel dizzy. Have swelling in your ankles. Have trouble with your vision. Get help right away if you: Develop a severe headache or confusion. Have unusual weakness or numbness. Feel faint. Have severe pain in your chest or abdomen. Vomit repeatedly. Have trouble breathing. These symptoms may be an emergency. Get help right away. Call 911. Do not wait to see if the symptoms will go away. Do not drive yourself to the hospital. Summary Hypertension is when the force of blood pumping through your arteries is too strong. If this condition is not controlled, it may put you at risk for serious complications. Your personal target blood pressure may vary depending on your medical conditions, your age, and other factors. For most people, a normal blood pressure is less than 120/80. Hypertension is treated with lifestyle changes, medicines, or a combination of both. Lifestyle changes include losing weight, eating a healthy,   low-sodium diet, exercising more, and limiting alcohol. This information is not intended to replace advice given to you by your health care provider. Make sure you discuss any questions you have with your health care provider. Document Revised: 05/22/2021 Document Reviewed: 05/22/2021 Elsevier Patient Education  2023 [employer]  

## 2022-08-19 ENCOUNTER — Other Ambulatory Visit: Payer: Self-pay | Admitting: Emergency Medicine

## 2022-08-19 DIAGNOSIS — M159 Polyosteoarthritis, unspecified: Secondary | ICD-10-CM

## 2022-08-19 DIAGNOSIS — Z8739 Personal history of other diseases of the musculoskeletal system and connective tissue: Secondary | ICD-10-CM

## 2022-08-22 ENCOUNTER — Other Ambulatory Visit: Payer: Self-pay | Admitting: Emergency Medicine

## 2022-08-22 DIAGNOSIS — F411 Generalized anxiety disorder: Secondary | ICD-10-CM

## 2022-09-02 ENCOUNTER — Other Ambulatory Visit: Payer: Self-pay | Admitting: Cardiovascular Disease

## 2022-10-17 ENCOUNTER — Other Ambulatory Visit: Payer: Self-pay | Admitting: Emergency Medicine

## 2022-10-17 DIAGNOSIS — Z8739 Personal history of other diseases of the musculoskeletal system and connective tissue: Secondary | ICD-10-CM

## 2022-10-17 DIAGNOSIS — M159 Polyosteoarthritis, unspecified: Secondary | ICD-10-CM

## 2022-11-08 ENCOUNTER — Other Ambulatory Visit: Payer: Self-pay | Admitting: Cardiovascular Disease

## 2022-11-18 ENCOUNTER — Ambulatory Visit (INDEPENDENT_AMBULATORY_CARE_PROVIDER_SITE_OTHER): Payer: Medicare Other

## 2022-11-18 DIAGNOSIS — Z0000 Encounter for general adult medical examination without abnormal findings: Secondary | ICD-10-CM

## 2022-11-18 NOTE — Progress Notes (Signed)
Subjective:   Samuel Chambers is a 72 y.o. male who presents for Medicare Annual/Subsequent preventive examination.  I connected with Samuel Chambers today by telephone and verified that I am speaking with the correct person using two identifiers. I discussed the limitations, risks, security and privacy concerns of performing an evaluation and management service by telephone and the availability of in person appointments. I also discussed with the patient that there may be a patient responsible charge related to this service. The patient expressed understanding and agreed to proceed. Location patient: home Location provider: Kyra Searles Persons participating in the visit: Samuel Chambers and Elyse Jarvis, CMA.  Time Spent with patient on telephone encounter: 10 mins  Review of Systems    No ROS. Medicare Wellness Telephone Visit. Additional risk factors are reflected in social history. Cardiac Risk Factors include: advanced age (>54men, >40 women);dyslipidemia;hypertension;male gender;obesity (BMI >30kg/m2)     Objective:    There were no vitals filed for this visit. There is no height or weight on file to calculate BMI.     11/18/2022    8:14 AM 11/12/2021    8:19 AM 08/01/2020   11:05 AM 06/05/2020    8:06 AM 01/24/2020    2:40 PM 01/24/2020    2:00 PM 01/17/2020    8:17 AM  Advanced Directives  Does Patient Have a Medical Advance Directive? No No No No No No No  Would patient like information on creating a medical advance directive? No - Patient declined No - Patient declined No - Patient declined No - Patient declined No - Patient declined No - Patient declined     Current Medications (verified) Outpatient Encounter Medications as of 11/18/2022  Medication Sig   aspirin EC 81 MG tablet Take 1 tablet (81 mg total) by mouth daily. Swallow whole.   DULoxetine (CYMBALTA) 60 MG capsule TAKE 1 CAPSULE BY MOUTH EVERY DAY   hydrochlorothiazide (MICROZIDE) 12.5 MG capsule Take 1 capsule (12.5  mg total) by mouth daily. Patient must schedule appointment for refills first attempt   losartan (COZAAR) 100 MG tablet TAKE 1 TABLET BY MOUTH EVERY DAY   meloxicam (MOBIC) 7.5 MG tablet TAKE 1 TABLET BY MOUTH EVERY DAY   metoprolol succinate (TOPROL-XL) 50 MG 24 hr tablet TAKE 1 TABLET BY MOUTH DAILY WITH OR IMMEDIATELY FOLLOWING A MEAL   No facility-administered encounter medications on file as of 11/18/2022.    Allergies (verified) Crestor [rosuvastatin], Valsartan, and Warfarin and related   History: Past Medical History:  Diagnosis Date   Adenomatous colon polyp 03/2005   Anxiety    CAD S/P percutaneous coronary angioplasty 2008   PCI to circumflex with a Promus DES 2.5 mm x 8 mm   Cataract    BILATERAL,REMOVED   Degenerative joint disease    Diabetes mellitus without complication (HCC)    diet controlled,PT.DENIES UPDATED 04/10/22   Diverticulosis    Fatty liver 2016   pt denies   Fibromyalgia    GERD (gastroesophageal reflux disease)    Heart murmur    Phreesia 07/29/2020   Hyperlipidemia    statin intolerant   Hypertension    Internal hemorrhoids    Obesity, Class II, BMI 35-39.9    Pneumonia 2016   PONV (postoperative nausea and vomiting)    Past Surgical History:  Procedure Laterality Date   CHOLECYSTECTOMY N/A 02/18/2013   Procedure: LAPAROSCOPIC CHOLECYSTECTOMY WITH INTRAOPERATIVE CHOLANGIOGRAM;  Surgeon: Valarie Merino, MD;  Location: [facility];  Service: General;  Laterality: N/A;  COLONOSCOPY     COLONOSCOPY  05/27/2022   CORONARY ANGIOPLASTY WITH STENT PLACEMENT  04/30/2007   2.5 mm Promus stent that was to 95% Circ;had 60-70% lesion to RCA   DOPPLER ECHOCARDIOGRAPHY  05/27/2002   [facility]normal EF 55-66%,   FOOT SURGERY Right    x2 fascia   HEEL SPUR SURGERY Right    HEMORRHOID SURGERY     Holter Monitor  04/07/2007   sinus tachy.;   JOINT REPLACEMENT N/A    Phreesia 07/29/2020   KNEE ARTHROSCOPY  03/27/2012   Procedure: ARTHROSCOPY KNEE;   Surgeon: Loanne Drilling, MD;  Location: [facility];  Service: Orthopedics;  Laterality: Left;   NM MYOCAR PERF WALL MOTION  12/19/2011   EXERCISED FOR 8-1/2 MINUTES RECHING 10 METABOLIC EQUIVALENTS. NO EVIDENCE  OF ISCHEMIA OR INFARCTION . EF 71%   POLYPECTOMY     renal dopplers  04/08/2007   relatively normal   TOTAL HIP ARTHROPLASTY Right 03/16/2018   Procedure: RIGHT TOTAL HIP ARTHROPLASTY ANTERIOR APPROACH;  Surgeon: Ollen Gross, MD;  Location: [facility];  Service: Orthopedics;  Laterality: Right;   TOTAL HIP ARTHROPLASTY Left about 10 to 12 years ago   TOTAL KNEE ARTHROPLASTY Left 09/04/2012   Procedure: TOTAL KNEE ARTHROPLASTY;  Surgeon: Loanne Drilling, MD;  Location: [facility];  Service: Orthopedics;  Laterality: Left;   TOTAL KNEE ARTHROPLASTY Right 01/24/2020   Procedure: TOTAL KNEE ARTHROPLASTY;  Surgeon: Ollen Gross, MD;  Location: [facility];  Service: Orthopedics;  Laterality: Right;    Family History  Problem Relation Age of Onset   Breast cancer Mother    Diabetes Mother    Prostate cancer Father    Colon cancer Neg Hx    Colon polyps Neg Hx    Esophageal cancer Neg Hx    Rectal cancer Neg Hx    Stomach cancer Neg Hx    Ulcerative colitis Neg Hx    Crohn's disease Neg Hx    Social History   Socioeconomic History   Marital status: Married    Spouse name: Not on file   Number of children: 1   Years of education: Not on file   Highest education level: Not on file  Occupational History    Employer: [employer]  Tobacco Use   Smoking status: Never    Passive exposure: Past (PARENTS SMOKED)   Smokeless tobacco: Never  Vaping Use   Vaping Use: Never used  Substance and Sexual Activity   Alcohol use: No   Drug use: No   Sexual activity: Not on file  Other Topics Concern   Not on file  Social History Narrative   Very little 0-2 drinks a week   Social Determinants of Health   Financial Resource Strain: Low Risk  (11/18/2022)   Overall Financial Resource  Strain (CARDIA)    Difficulty of Paying Living Expenses: Not hard at all  Food Insecurity: No Food Insecurity (11/18/2022)   Hunger Vital Sign    Worried About Running Out of Food in the Last Year: Never true    Ran Out of Food in the Last Year: Never true  Transportation Needs: No Transportation Needs (11/18/2022)   PRAPARE - Administrator, Civil Service (Medical): No    Lack of Transportation (Non-Medical): No  Physical Activity: Insufficiently Active (11/18/2022)   Exercise Vital Sign    Days of Exercise per Week: 3 days    Minutes of Exercise per Session: 40 min  Stress: No Stress  Concern Present (11/18/2022)   [employer] of Occupational Health - Occupational Stress Questionnaire    Feeling of Stress : Not at all  Social Connections: Moderately Integrated (11/18/2022)   Social Connection and Isolation Panel [NHANES]    Frequency of Communication with Friends and Family: More than three times a week    Frequency of Social Gatherings with Friends and Family: More than three times a week    Attends Religious Services: More than 4 times per year    Active Member of [employer] or Organizations: No    Attends Engineer, structural: Never    Marital Status: Married    Tobacco Counseling Counseling given: Not Answered   Clinical Intake:  Pre-visit preparation completed: Yes  Pain : No/denies pain     BMI - recorded: 35 Nutritional Status: BMI > 30  Obese Nutritional Risks: None Diabetes: No  How often do you need to have someone help you when you read instructions, pamphlets, or other written materials from your doctor or pharmacy?: 1 - Never What is the last grade level you completed in school?: 12th grade   Interpreter Needed?: No  Information entered by :: Elyse Jarvis, CMA   Activities of Daily Living    11/18/2022    8:14 AM  In your present state of health, do you have any difficulty performing the following activities:  Hearing? 1   Comment difficulty hearing, has hearing aids but does not wear often  Vision? 0  Difficulty concentrating or making decisions? 0  Walking or climbing stairs? 0  Dressing or bathing? 0  Doing errands, shopping? 0  Preparing Food and eating ? N  Using the Toilet? N  In the past six months, have you accidently leaked urine? N  Do you have problems with loss of bowel control? N  Managing your Medications? N  Managing your Finances? N  Housekeeping or managing your Housekeeping? N    Patient Care Team: Georgina Quint, MD as PCP - General (Internal Medicine) Thurmon Fair, MD as PCP - Cardiology (Cardiology) Meryl Dare, MD as Consulting Physician (Gastroenterology) Croitoru, Rachelle Hora, MD as Consulting Physician (Cardiology)  Indicate any recent Medical Services you may have received from other than [facility] providers in the past year (date may be approximate).     Assessment:   This is a routine wellness examination for Samuel Chambers.  Hearing/Vision screen Patient has hearing difficulty and wears hearing aids (sometimes). Patient does not wear any corrective lenses/contacts. Patient has had cataract surgery.   Dietary issues and exercise activities discussed: Current Exercise Habits: Home exercise routine, Type of exercise: Other - see comments (fishing and walking), Time (Minutes): 40, Frequency (Times/Week): 3, Weekly Exercise (Minutes/Week): 120, Intensity: Mild, Exercise limited by: None identified   Goals Addressed             This Visit's Progress    Patient Stated       Patient stated he is currently working on losing weight, has cut out sweets and stopped soft drinks.        Depression Screen    11/18/2022    8:13 AM 04/18/2022    8:50 AM 02/12/2022    8:17 AM 11/12/2021    8:20 AM 11/12/2021    8:17 AM 08/01/2020   11:05 AM 07/03/2020    2:46 PM  PHQ 2/9 Scores  PHQ - 2 Score 0 0 0 0 0 0 0    Fall Risk    11/18/2022    8:14  AM 04/18/2022    8:50 AM  02/12/2022    8:17 AM 11/12/2021    8:19 AM 02/07/2021    4:10 PM  Fall Risk   Falls in the past year? 0 0 0 0 0  Number falls in past yr: 0 0  0 0  Injury with Fall? 0 0  0 0  Risk for fall due to : No Fall Risks No Fall Risks     Follow up Falls evaluation completed Falls evaluation completed  Falls evaluation completed     FALL RISK PREVENTION PERTAINING TO THE HOME:  Any stairs in or around the home? No  If so, are there any without handrails?  N/A Home free of loose throw rugs in walkways, pet beds, electrical cords, etc? Yes  Adequate lighting in your home to reduce risk of falls? Yes   ASSISTIVE DEVICES UTILIZED TO PREVENT FALLS:  Life alert? No  Use of a cane, walker or w/c? No  Grab bars in the bathroom? Yes  Shower chair or bench in shower? No  Elevated toilet seat or a handicapped toilet? No   TIMED UP AND GO:  Was the test performed? No .  Length of time to ambulate 10 feet: N/A sec.   Patient stated that he has no issues with gait or balance; does not use any assistive devices.  Cognitive Function:  Patient is cogitatively intact.      11/18/2022    8:15 AM 08/01/2020   11:02 AM 05/10/2019    8:09 AM 10/14/2017    2:24 PM  6CIT Screen  What Year? 0 points 0 points 0 points 0 points  What month? 0 points 0 points 0 points 0 points  What time? 0 points 0 points 0 points 0 points  Count back from 20 0 points 0 points 0 points 0 points  Months in reverse 0 points 0 points 0 points 0 points  Repeat phrase 0 points 0 points 0 points 6 points  Total Score 0 points 0 points 0 points 6 points    Immunizations Immunization History  Administered Date(s) Administered   Influenza, High Dose Seasonal PF 05/14/2018   Influenza,inj,Quad PF,6+ Mos 09/02/2016   Pneumococcal Conjugate-13 09/02/2016   Tdap 02/13/2014    TDAP status: Up to date  Flu Vaccine status: Declined, Education has been provided regarding the importance of this vaccine but patient still  declined. Advised may receive this vaccine at local pharmacy or Health Dept. Aware to provide a copy of the vaccination record if obtained from local pharmacy or Health Dept. Verbalized acceptance and understanding.  Pneumococcal vaccine status: Declined,  Education has been provided regarding the importance of this vaccine but patient still declined. Advised may receive this vaccine at local pharmacy or Health Dept. Aware to provide a copy of the vaccination record if obtained from local pharmacy or Health Dept. Verbalized acceptance and understanding.   Covid-19 vaccine status: Declined, Education has been provided regarding the importance of this vaccine but patient still declined. Advised may receive this vaccine at local pharmacy or Health Dept.or vaccine clinic. Aware to provide a copy of the vaccination record if obtained from local pharmacy or Health Dept. Verbalized acceptance and understanding.  Qualifies for Shingles Vaccine? Yes   Zostavax completed No   Shingrix Completed?: No.    Education has been provided regarding the importance of this vaccine. Patient has been advised to call insurance company to determine out of pocket expense if they have not yet received  this vaccine. Advised may also receive vaccine at local pharmacy or Health Dept. Verbalized acceptance and understanding.  Screening Tests Health Maintenance  Topic Date Due   COVID-19 Vaccine (1) Never done   Zoster Vaccines- Shingrix (1 of 2) Never done   Pneumonia Vaccine 52+ Years old (2 of 2 - PPSV23 or PCV20) 02/13/2023 (Originally 09/02/2017)   INFLUENZA VACCINE  02/27/2023   Medicare Annual Wellness (AWV)  11/18/2023   DTaP/Tdap/Td (2 - Td or Tdap) 02/14/2024   COLONOSCOPY (Pts 45-13yrs Insurance coverage will need to be confirmed)  05/27/2025   Hepatitis C Screening  Completed   HPV VACCINES  Aged Out    Health Maintenance  Health Maintenance Due  Topic Date Due   COVID-19 Vaccine (1) Never done   Zoster  Vaccines- Shingrix (1 of 2) Never done    Colorectal cancer screening: Type of screening: Colonoscopy. Completed 05/27/2022. Repeat every 3 years  Lung Cancer Screening: (Low Dose CT Chest recommended if Age 63-80 years, 30 pack-year currently smoking OR have quit w/in 15years.) does not qualify.   Lung Cancer Screening Referral: N/A  Additional Screening:  Hepatitis C Screening: does qualify; Completed 09/02/2016  Vision Screening: Recommended annual ophthalmology exams for early detection of glaucoma and other disorders of the eye. Is the patient up to date with their annual eye exam?  Yes  Who is the provider or what is the name of the office in which the patient attends annual eye exams? Dr. Alvan Dame If pt is not established with a provider, would they like to be referred to a provider to establish care? No .   Dental Screening: Recommended annual dental exams for proper oral hygiene  Community Resource Referral / Chronic Care Management: CRR required this visit?  No   CCM required this visit?  No      Plan:     I have personally reviewed and noted the following in the patient's chart:   Medical and social history Use of alcohol, tobacco or illicit drugs  Current medications and supplements including opioid prescriptions. Patient is not currently taking opioid prescriptions. Functional ability and status Nutritional status Physical activity Advanced directives List of other physicians Hospitalizations, surgeries, and ER visits in previous 12 months Vitals Screenings to include cognitive, depression, and falls Referrals and appointments  In addition, I have reviewed and discussed with patient certain preventive protocols, quality metrics, and best practice recommendations. A written personalized care plan for preventive services as well as general preventive health recommendations were provided to patient.     Marinus Maw, CMA   11/18/2022   Nurse Notes: N/A

## 2022-12-10 ENCOUNTER — Other Ambulatory Visit: Payer: Self-pay | Admitting: Cardiovascular Disease

## 2022-12-14 ENCOUNTER — Other Ambulatory Visit: Payer: Self-pay | Admitting: Cardiovascular Disease

## 2022-12-28 ENCOUNTER — Other Ambulatory Visit: Payer: Self-pay | Admitting: Cardiovascular Disease

## 2022-12-31 ENCOUNTER — Other Ambulatory Visit: Payer: Self-pay | Admitting: Emergency Medicine

## 2022-12-31 DIAGNOSIS — M159 Polyosteoarthritis, unspecified: Secondary | ICD-10-CM

## 2022-12-31 DIAGNOSIS — Z8739 Personal history of other diseases of the musculoskeletal system and connective tissue: Secondary | ICD-10-CM

## 2023-01-08 ENCOUNTER — Other Ambulatory Visit: Payer: Self-pay | Admitting: Cardiovascular Disease

## 2023-01-10 ENCOUNTER — Other Ambulatory Visit: Payer: Self-pay | Admitting: Cardiovascular Disease

## 2023-01-20 ENCOUNTER — Other Ambulatory Visit: Payer: Self-pay | Admitting: Urology

## 2023-02-10 ENCOUNTER — Other Ambulatory Visit: Payer: Self-pay

## 2023-02-10 ENCOUNTER — Observation Stay (HOSPITAL_COMMUNITY)
Admission: EM | Admit: 2023-02-10 | Discharge: 2023-02-11 | Disposition: A | Discharge status: Home / Self Care | Payer: Medicare Other | Attending: Family Medicine | Admitting: Family Medicine

## 2023-02-10 ENCOUNTER — Encounter (HOSPITAL_COMMUNITY): Payer: Self-pay | Admitting: Emergency Medicine

## 2023-02-10 ENCOUNTER — Emergency Department (HOSPITAL_COMMUNITY): Payer: Medicare Other

## 2023-02-10 DIAGNOSIS — I2510 Atherosclerotic heart disease of native coronary artery without angina pectoris: Secondary | ICD-10-CM | POA: Diagnosis not present

## 2023-02-10 DIAGNOSIS — I10 Essential (primary) hypertension: Secondary | ICD-10-CM | POA: Diagnosis present

## 2023-02-10 DIAGNOSIS — R29810 Facial weakness: Secondary | ICD-10-CM | POA: Diagnosis not present

## 2023-02-10 DIAGNOSIS — E119 Type 2 diabetes mellitus without complications: Secondary | ICD-10-CM | POA: Diagnosis not present

## 2023-02-10 DIAGNOSIS — Z96653 Presence of artificial knee joint, bilateral: Secondary | ICD-10-CM | POA: Insufficient documentation

## 2023-02-10 DIAGNOSIS — Z955 Presence of coronary angioplasty implant and graft: Secondary | ICD-10-CM | POA: Insufficient documentation

## 2023-02-10 DIAGNOSIS — Z7722 Contact with and (suspected) exposure to environmental tobacco smoke (acute) (chronic): Secondary | ICD-10-CM | POA: Insufficient documentation

## 2023-02-10 DIAGNOSIS — Z96643 Presence of artificial hip joint, bilateral: Secondary | ICD-10-CM | POA: Diagnosis not present

## 2023-02-10 DIAGNOSIS — R471 Dysarthria and anarthria: Secondary | ICD-10-CM | POA: Insufficient documentation

## 2023-02-10 DIAGNOSIS — R4781 Slurred speech: Secondary | ICD-10-CM | POA: Diagnosis present

## 2023-02-10 DIAGNOSIS — Z79899 Other long term (current) drug therapy: Secondary | ICD-10-CM | POA: Insufficient documentation

## 2023-02-10 DIAGNOSIS — E785 Hyperlipidemia, unspecified: Secondary | ICD-10-CM | POA: Diagnosis present

## 2023-02-10 DIAGNOSIS — G459 Transient cerebral ischemic attack, unspecified: Secondary | ICD-10-CM | POA: Diagnosis not present

## 2023-02-10 DIAGNOSIS — Z7982 Long term (current) use of aspirin: Secondary | ICD-10-CM | POA: Diagnosis not present

## 2023-02-10 DIAGNOSIS — I152 Hypertension secondary to endocrine disorders: Secondary | ICD-10-CM | POA: Diagnosis present

## 2023-02-10 LAB — URINALYSIS, ROUTINE W REFLEX MICROSCOPIC
Bilirubin Urine: NEGATIVE
Glucose, UA: NEGATIVE mg/dL
Hgb urine dipstick: NEGATIVE
Ketones, ur: NEGATIVE mg/dL
Leukocytes,Ua: NEGATIVE
Nitrite: NEGATIVE
Protein, ur: NEGATIVE mg/dL
Specific Gravity, Urine: 1.015 (ref 1.005–1.030)
pH: 5 (ref 5.0–8.0)

## 2023-02-10 LAB — COMPREHENSIVE METABOLIC PANEL
ALT: 33 U/L (ref 0–44)
AST: 32 U/L (ref 15–41)
Albumin: 3.9 g/dL (ref 3.5–5.0)
Alkaline Phosphatase: 50 U/L (ref 38–126)
Anion gap: 9 (ref 5–15)
BUN: 15 mg/dL (ref 8–23)
CO2: 26 mmol/L (ref 22–32)
Calcium: 9.3 mg/dL (ref 8.9–10.3)
Chloride: 101 mmol/L (ref 98–111)
Creatinine, Ser: 0.98 mg/dL (ref 0.61–1.24)
GFR, Estimated: >60 mL/min (ref >60)
Glucose, Bld: 135 mg/dL — ABNORMAL HIGH (ref 70–99)
Potassium: 3.9 mmol/L (ref 3.5–5.1)
Sodium: 136 mmol/L (ref 135–145)
Total Bilirubin: 0.5 mg/dL (ref 0.3–1.2)
Total Protein: 6.8 g/dL (ref 6.5–8.1)

## 2023-02-10 LAB — CBC
HCT: 39.8 % (ref 39.0–52.0)
Hemoglobin: 13.6 g/dL (ref 13.0–17.0)
MCH: 29.2 pg (ref 26.0–34.0)
MCHC: 34.2 g/dL (ref 30.0–36.0)
MCV: 85.6 fL (ref 80.0–100.0)
Platelets: 208 10*3/uL (ref 150–400)
RBC: 4.65 MIL/uL (ref 4.22–5.81)
RDW: 13.4 % (ref 11.5–15.5)
WBC: 9.4 10*3/uL (ref 4.0–10.5)
nRBC: 0 % (ref 0.0–0.2)

## 2023-02-10 LAB — RAPID URINE DRUG SCREEN, HOSP PERFORMED
Amphetamines: NOT DETECTED
Barbiturates: NOT DETECTED
Benzodiazepines: NOT DETECTED
Cocaine: NOT DETECTED
Opiates: NOT DETECTED
Tetrahydrocannabinol: NOT DETECTED

## 2023-02-10 LAB — DIFFERENTIAL
Abs Immature Granulocytes: 0.08 10*3/uL — ABNORMAL HIGH (ref 0.00–0.07)
Basophils Absolute: 0.1 10*3/uL (ref 0.0–0.1)
Basophils Relative: 1 %
Eosinophils Absolute: 0.5 10*3/uL (ref 0.0–0.5)
Eosinophils Relative: 5 %
Immature Granulocytes: 1 %
Lymphocytes Relative: 28 %
Lymphs Abs: 2.6 10*3/uL (ref 0.7–4.0)
Monocytes Absolute: 0.8 10*3/uL (ref 0.1–1.0)
Monocytes Relative: 9 %
Neutro Abs: 5.3 10*3/uL (ref 1.7–7.7)
Neutrophils Relative %: 56 %

## 2023-02-10 LAB — PROTIME-INR
INR: 1 (ref 0.8–1.2)
Prothrombin Time: 13.5 seconds (ref 11.4–15.2)

## 2023-02-10 LAB — APTT: aPTT: 29 seconds (ref 24–36)

## 2023-02-10 LAB — ETHANOL: Alcohol, Ethyl (B): <10 mg/dL (ref <10)

## 2023-02-10 MED ORDER — ASPIRIN 325 MG PO TABS
325.0000 mg | ORAL_TABLET | Freq: Once | ORAL | Status: AC
  Administered 2023-02-10: 325 mg via ORAL
  Filled 2023-02-10: qty 1

## 2023-02-10 MED ORDER — CLOPIDOGREL BISULFATE 75 MG PO TABS
300.0000 mg | ORAL_TABLET | Freq: Once | ORAL | Status: AC
  Administered 2023-02-10: 300 mg via ORAL
  Filled 2023-02-10: qty 4

## 2023-02-10 NOTE — ED Triage Notes (Signed)
Pt to ed via [facility]. Pt was at church when family noticed right sided facial droop and pt having garbled speech. Upon ems arrival, pt was normal and at baseline according to spouse. LKW 1908. Pt denies headaches, dizziness, tingling at this time. EDP at bedside during triage.

## 2023-02-10 NOTE — Consult Note (Signed)
[facility] Tele[facility]rology Consult Services  Stat Consult  Patient Name:   Samuel Chambers, Samuel Chambers Date of Birth:   [birth year] Identification Number:   MRN - [id number] Date of Service:   02/10/2023 20:16:11  Diagnosis:       G45.9 - Transient cerebral ischemic attack, unspecified  Impression transient dysarthria and right facial droop; symptoms resolved at this time. concern for transient ischemic attack; etiology unclear at this time, possible small vessel disease but workup pending at this time.  Plan: load with aspirin 325 mg x1 and plavix 300 mg x1. tentative plan for 21 day course of plavix 75 mg daily starting tomorrow in addition to his home aspirin 81 mg daily, followed by antiplatelet monotherapy thereafter. Allow permissive hypertension up to 220/110 for first 24 hours with gradual reduction for goal normotension thereafter. Recommend admission for further workup. Will need an MRI brain and MRA head/neck when able, echo, continue to monitor on telemetry, labs for risk factor stratification (lipid panel, HbA1c, TSH with free T4), PT/OT/speech eval.  Plan discussed with patient/family, ED team, including ED physician, who are all in agreement with plan. All questions answered to the best of my ability.   Recommendations: Our recommendations are outlined below.  Disposition : Neurology will follow   ----------------------------------------------------------------------------------------------------    Metrics: [facility] Notification Time: 02/10/2023 20:13:04 Stamp Time: 02/10/2023 20:16:11 Callback Response Time: 02/10/2023 20:27:10  Primary Provider Notified of Diagnostic Impression and Management Plan on: 02/10/2023 21:00:35   CT HEAD: Reviewed Imaging was personally reviewed. CTH is negative for bleed or early ischemic changes.    ----------------------------------------------------------------------------------------------------  Chief  Complaint: transient slurred speech with right facial droop  History of Present Illness: Patient is a 72 year old Male. 72 yo man with a history of hypertension, hyperlipidemia, diabetes (not on any antihyperglycemic medications), coronary artery disease s/p cardiac stenting, who presents to the ED with transient episode of slurred speech and right facial droop. LKW/onset 1900. Per wife, they were leading a bible study and were standing in front of their group when she reports he suddenly sounded thick-tongued and was slurring his words. she turned to look at his face and noted a right facial droop but his smile appeared symmetric. apart from his slurred speech and right facial droop, they did not notice any weakness of the extremities or any difficulty ambulating. he appeared steady while ambulating and did not appear off balance. He did not report any vision changes or any focal numbness/tingling of the face or extremities. By the time EMS came, symptoms had resolved. POC glucose on site was 126. he was mildly hypertensive. Per wife, symptoms lasted 5-10 mins and have not reoccurred.  At the time of my examination, both patient and wife confirm that he is back to baseline at this time.  of note, wife tells me that he has prior history of prediabetes, which resolved with dietary modifications and weight loss. However, last A1c is in the diabetic range (6.5 in 07/2022 and previously as high at 6.9 in 2019).    Past Medical History:      Hypertension      Diabetes Mellitus      Hyperlipidemia      Coronary Artery Disease  Medications:  No Anticoagulant use  Antiplatelet use: Yes aspirin 81 mg daily Reviewed EMR for current medications Other Medications Pertinent To Assessment Include: metoprolol  losartan  Allergies:  Reviewed  Social History: Patient Is: Married Smoking: No  Family History:  There is no family history of premature  cerebrovascular disease pertinent to this  consultation  ROS : 14 Points Review of Systems was performed and was negative except mentioned in HPI.  Past Surgical History: There Is No Surgical History Contributory To Today's Visit There Is Surgical History of:  cariac stent  hip and knee surgery    Examination: BP(154/74), Pulse(74), Blood Glucose(135) 1A: Level of Consciousness - Alert; keenly responsive + 0 1B: Ask Month and Age - Both Questions Right + 0 1C: Blink Eyes & Squeeze Hands - Performs Both Tasks + 0 2: Test Horizontal Extraocular Movements - Normal + 0 3: Test Visual Fields - No Visual Loss + 0 4: Test Facial Palsy (Use Grimace if Obtunded) - Normal symmetry + 0 5A: Test Left Arm Motor Drift - No Drift for 10 Seconds + 0 5B: Test Right Arm Motor Drift - No Drift for 10 Seconds + 0 6A: Test Left Leg Motor Drift - No Drift for 5 Seconds + 0 6B: Test Right Leg Motor Drift - No Drift for 5 Seconds + 0 7: Test Limb Ataxia (FNF/Heel-Shin) - No Ataxia + 0 8: Test Sensation - Normal; No sensory loss + 0 9: Test Language/Aphasia - Normal; No aphasia + 0 10: Test Dysarthria - Normal + 0 11: Test Extinction/Inattention - No abnormality + 0  NIHSS Score: 0  Spoke with : Dr. Charm Barges    This consult was conducted in real time using interactive audio and Immunologist. Patient was informed of the technology being used for this visit and agreed to proceed. Patient located in hospital and provider located at home/office setting.  Patient is being evaluated for possible acute neurologic impairment and high probability of imminent or life - threatening deterioration.I spent total of 36 minutes providing care to this patient, including time for face to face visit via telemedicine, review of medical records, imaging studies and discussion of findings with providers, the patient and / or family.   Dr Donnel Saxon   [facility] For Inpatient follow-up with [facility] physician please call [facility] [phone].  This is not an outpatient service. Post hospital discharge, please contact hospital directly.  Please do not communicate with [facility] physicians via secure chat. If you have any questions, Please contact [facility]. Please call or reconsult our service if there are any clinical or diagnostic changes.

## 2023-02-10 NOTE — H&P (Incomplete)
History and Physical    Patient: Samuel Chambers NFA:[id number] DOB: 12-Aug-1950 DOA: 02/10/2023 DOS: the patient was seen and examined on 02/10/2023 PCP: Georgina Quint, MD  Patient coming from: Home  Chief Complaint:  Chief Complaint  Patient presents with  . Possible TIA   HPI: Samuel Chambers is a 72 y.o. male with medical history significant of hypertension, CAD s/p stent placement who presents to the emergency department via EMS due to a transient slurred speech.  Patient was last well-known at 34.  History was obtained from ED physician and ED medical record.  Per report, patient was laid in a Bible study and was standing in front of the Bible study group when wife noted that he sounded thick tongued and was slurring his words, on closer look at patient, he was noted to have right facial droop, due to his smile appears symmetric.  Patient had no weakness of extremities or difficulty in being able to ambulate, did he report any vision changes or any numbness or tingling of extremities.  EMS was activated and by the time EMS team arrived, symptoms already resolved.  Symptoms was reported to have lasted about 5 to 10 minutes.  Blood glucose on arrival of EMS team was 126.  ED Course:  In the emergency department, BP was 154/74, but other vital signs were within normal range.  Workup in the ED showed normal CBC and BMP except for blood glucose of 135.  Urinalysis was normal, urine drug screen was normal, alcohol level was less than 10. CT head without contrast showed no acute rectal abnormality Teleneurologist was consulted and recommended aspirin and Plavix, this was given in the ED.  Further stroke workup was also recommended.  Hospitalist was asked to admit patient for further evaluation and management.  Review of Systems: Review of systems as noted in the HPI. All other systems reviewed and are negative.   Past Medical History:  Diagnosis Date  . Adenomatous colon polyp  03/2005  . Anxiety   . CAD S/P percutaneous coronary angioplasty 2008   PCI to circumflex with a Promus DES 2.5 mm x 8 mm  . Cataract    BILATERAL,REMOVED  . Degenerative joint disease   . Diabetes mellitus without complication (HCC)    diet controlled,PT.DENIES UPDATED 04/10/22  . Diverticulosis   . Fatty liver 2016   pt denies  . Fibromyalgia   . GERD (gastroesophageal reflux disease)   . Heart murmur    Phreesia 07/29/2020  . Hyperlipidemia    statin intolerant  . Hypertension   . Internal hemorrhoids   . Obesity, Class II, BMI 35-39.9   . Pneumonia 2016  . PONV (postoperative nausea and vomiting)    Past Surgical History:  Procedure Laterality Date  . CHOLECYSTECTOMY N/A 02/18/2013   Procedure: LAPAROSCOPIC CHOLECYSTECTOMY WITH INTRAOPERATIVE CHOLANGIOGRAM;  Surgeon: Valarie Merino, MD;  Location: [facility];  Service: General;  Laterality: N/A;  . COLONOSCOPY    . COLONOSCOPY  05/27/2022  . CORONARY ANGIOPLASTY WITH STENT PLACEMENT  04/30/2007   2.5 mm Promus stent that was to 95% Circ;had 60-70% lesion to RCA  . DOPPLER ECHOCARDIOGRAPHY  05/27/2002   [facility].-normal EF 55-66%,  . FOOT SURGERY Right    x2 fascia  . HEEL SPUR SURGERY Right   . HEMORRHOID SURGERY    . Holter Monitor  04/07/2007   sinus tachy.;  . JOINT REPLACEMENT N/A    Phreesia 07/29/2020  . KNEE ARTHROSCOPY  03/27/2012  Procedure: ARTHROSCOPY KNEE;  Surgeon: Loanne Drilling, MD;  Location: [facility];  Service: Orthopedics;  Laterality: Left;  . NM MYOCAR PERF WALL MOTION  12/19/2011   EXERCISED FOR 8-1/2 MINUTES RECHING 10 METABOLIC EQUIVALENTS. NO EVIDENCE  OF ISCHEMIA OR INFARCTION . EF 71%  . POLYPECTOMY    . renal dopplers  04/08/2007   relatively normal  . TOTAL HIP ARTHROPLASTY Right 03/16/2018   Procedure: RIGHT TOTAL HIP ARTHROPLASTY ANTERIOR APPROACH;  Surgeon: Ollen Gross, MD;  Location: [facility];  Service: Orthopedics;  Laterality: Right;  . TOTAL HIP ARTHROPLASTY Left about 10 to 12  years ago  . TOTAL KNEE ARTHROPLASTY Left 09/04/2012   Procedure: TOTAL KNEE ARTHROPLASTY;  Surgeon: Loanne Drilling, MD;  Location: [facility];  Service: Orthopedics;  Laterality: Left;  . TOTAL KNEE ARTHROPLASTY Right 01/24/2020   Procedure: TOTAL KNEE ARTHROPLASTY;  Surgeon: Ollen Gross, MD;  Location: [facility];  Service: Orthopedics;  Laterality: Right;     Social History:  reports that he has never smoked. He has been exposed to tobacco smoke. He has never used smokeless tobacco. He reports that he does not drink alcohol and does not use drugs.   Allergies  Allergen Reactions  . Crestor [Rosuvastatin] Other (See Comments)    myalgias  . Valsartan Other (See Comments)    Made patient feel tired and no energy  . Warfarin And Related Other (See Comments)    Fast heart beat, went down hill, felt like he was dying    Family History  Problem Relation Age of Onset  . Breast cancer Mother   . Diabetes Mother   . Prostate cancer Father   . Colon cancer Neg Hx   . Colon polyps Neg Hx   . Esophageal cancer Neg Hx   . Rectal cancer Neg Hx   . Stomach cancer Neg Hx   . Ulcerative colitis Neg Hx   . Crohn's disease Neg Hx     ***  Prior to Admission medications   Medication Sig Start Date End Date Taking? Authorizing Provider  aspirin EC 81 MG tablet Take 1 tablet (81 mg total) by mouth daily. Swallow whole. 06/13/20   Croitoru, Mihai, MD  DULoxetine (CYMBALTA) 60 MG capsule TAKE 1 CAPSULE BY MOUTH EVERY DAY 08/22/22   Georgina Quint, MD  hydrochlorothiazide (MICROZIDE) 12.5 MG capsule TAKE 1 CAPSULE BY MOUTH EVERY DAY (NEED OFFICE VISIT) 01/10/23   Croitoru, Rachelle Hora, MD  losartan (COZAAR) 100 MG tablet TAKE 1 TABLET BY MOUTH EVERY DAY PT. WILL NEED TO MAKE AN APPOINTMENT IN ORDER TO RECEIVE FUTURE REFILLS. FIRST ATTEMPT. 01/08/23   Croitoru, Rachelle Hora, MD  meloxicam (MOBIC) 7.5 MG tablet TAKE 1 TABLET BY MOUTH EVERY DAY 12/31/22   Georgina Quint, MD  metoprolol succinate  (TOPROL-XL) 50 MG 24 hr tablet TAKE 1 TABLET BY MOUTH DAILY WITH OR IMMEDIATELY FOLLOWING A MEAL 09/03/22   Croitoru, Mihai, MD    Physical Exam: BP (!) 154/74   Pulse 67   Temp 98 F (36.7 C) (Oral)   Resp 16   Ht 6' (1.829 m)   Wt 117.6 kg   SpO2 95%   BMI 35.15 kg/m   General: 72 y.o. year-old male well developed well nourished in no acute distress.  Alert and oriented x3. HEENT: NCAT, EOMI Neck: Supple, trachea medial Cardiovascular: Regular rate and rhythm with no rubs or gallops.  No thyromegaly or JVD noted.  No lower extremity edema. 2/4 pulses in all 4  extremities. Respiratory: Clear to auscultation with no wheezes or rales. Good inspiratory effort. Abdomen: Soft, nontender nondistended with normal bowel sounds x4 quadrants. Muskuloskeletal: No cyanosis, clubbing or edema noted bilaterally Neuro: CN II-XII intact, strength 5/5 x 4, sensation, reflexes intact Skin: No ulcerative lesions noted or rashes Psychiatry: Judgement and insight appear normal. Mood is appropriate for condition and setting          Labs on Admission:  Basic Metabolic Panel: Recent Labs  Lab 02/10/23 2008  NA 136  K 3.9  CL 101  CO2 26  GLUCOSE 135*  BUN 15  CREATININE 0.98  CALCIUM 9.3   Liver Function Tests: Recent Labs  Lab 02/10/23 2008  AST 32  ALT 33  ALKPHOS 50  BILITOT 0.5  PROT 6.8  ALBUMIN 3.9   No results for input(s): "LIPASE", "AMYLASE" in the last 168 hours. No results for input(s): "AMMONIA" in the last 168 hours. CBC: Recent Labs  Lab 02/10/23 2008  WBC 9.4  NEUTROABS 5.3  HGB 13.6  HCT 39.8  MCV 85.6  PLT 208   Cardiac Enzymes: No results for input(s): "CKTOTAL", "CKMB", "CKMBINDEX", "TROPONINI" in the last 168 hours.  BNP (last 3 results) No results for input(s): "BNP" in the last 8760 hours.  ProBNP (last 3 results) No results for input(s): "PROBNP" in the last 8760 hours.  CBG: No results for input(s): "GLUCAP" in the last 168  hours.  Radiological Exams on Admission: CT HEAD WO CONTRAST  Result Date: 02/10/2023 CLINICAL DATA:  Facial droop, dysarthria EXAM: CT HEAD WITHOUT CONTRAST TECHNIQUE: Contiguous axial images were obtained from the base of the skull through the vertex without intravenous contrast. RADIATION DOSE REDUCTION: This exam was performed according to the departmental dose-optimization program which includes automated exposure control, adjustment of the mA and/or kV according to patient size and/or use of iterative reconstruction technique. COMPARISON:  None Available. FINDINGS: Brain: No evidence of acute infarction, hemorrhage, hydrocephalus, extra-axial collection or mass lesion/mass effect. Vascular: No hyperdense vessel or unexpected calcification. Skull: Normal. Negative for fracture or focal lesion. Sinuses/Orbits: No acute finding. Other: Mastoid air cells and middle ear cavities are clear IMPRESSION: 1. No acute intracranial abnormality. Electronically Signed   By: Helyn Numbers M.D.   On: 02/10/2023 20:34    EKG: I independently viewed the EKG done and my findings are as followed: Normal sinus rhythm at rate of 60 bpm  Assessment/Plan Present on Admission: . TIA (transient ischemic attack)  Principal Problem:   TIA (transient ischemic attack)  Transient ischemic attack Patient will be admitted to telemetry unit  Echocardiogram in the morning MRI of brain without contrast in the morning MRA head and neck Respiratory for milligram x 1 and Plavix 300 mg x 1 was given Continue aspirin 81 mg and Plavix 75 mg daily, to be followed by monotherapy thereafter Continue fall precautions and neuro checks Lipid panel, TSH and hemoglobin A1c will be checked Continue PT/SLP/OT eval and treat Bedside swallow eval by nursing prior to diet Consider tele neurology consult status post imaging studies      DVT prophylaxis: ***   Code Status: ***   Family Communication: ***   Disposition Plan:  ***   Consults called: ***   Admission status: ***     Frankey Shown MD [facility] Pager [phone]  If 7PM-7AM, please contact night-coverage [email or web address] Password [facility]  02/10/2023, 10:23 PM        Review of Systems: {ROS_Text:26778} Past Medical History:  Diagnosis Date  .  Adenomatous colon polyp 03/2005  . Anxiety   . CAD S/P percutaneous coronary angioplasty 2008   PCI to circumflex with a Promus DES 2.5 mm x 8 mm  . Cataract    BILATERAL,REMOVED  . Degenerative joint disease   . Diabetes mellitus without complication (HCC)    diet controlled,PT.DENIES UPDATED 04/10/22  . Diverticulosis   . Fatty liver 2016   pt denies  . Fibromyalgia   . GERD (gastroesophageal reflux disease)   . Heart murmur    Phreesia 07/29/2020  . Hyperlipidemia    statin intolerant  . Hypertension   . Internal hemorrhoids   . Obesity, Class II, BMI 35-39.9   . Pneumonia 2016  . PONV (postoperative nausea and vomiting)    Past Surgical History:  Procedure Laterality Date  . CHOLECYSTECTOMY N/A 02/18/2013   Procedure: LAPAROSCOPIC CHOLECYSTECTOMY WITH INTRAOPERATIVE CHOLANGIOGRAM;  Surgeon: Valarie Merino, MD;  Location: [facility];  Service: General;  Laterality: N/A;  . COLONOSCOPY    . COLONOSCOPY  05/27/2022  . CORONARY ANGIOPLASTY WITH STENT PLACEMENT  04/30/2007   2.5 mm Promus stent that was to 95% Circ;had 60-70% lesion to RCA  . DOPPLER ECHOCARDIOGRAPHY  05/27/2002   [facility].-normal EF 55-66%,  . FOOT SURGERY Right    x2 fascia  . HEEL SPUR SURGERY Right   . HEMORRHOID SURGERY    . Holter Monitor  04/07/2007   sinus tachy.;  . JOINT REPLACEMENT N/A    Phreesia 07/29/2020  . KNEE ARTHROSCOPY  03/27/2012   Procedure: ARTHROSCOPY KNEE;  Surgeon: Loanne Drilling, MD;  Location: [facility];  Service: Orthopedics;  Laterality: Left;  . NM MYOCAR PERF WALL MOTION  12/19/2011   EXERCISED FOR 8-1/2 MINUTES RECHING 10 METABOLIC EQUIVALENTS. NO EVIDENCE  OF ISCHEMIA  OR INFARCTION . EF 71%  . POLYPECTOMY    . renal dopplers  04/08/2007   relatively normal  . TOTAL HIP ARTHROPLASTY Right 03/16/2018   Procedure: RIGHT TOTAL HIP ARTHROPLASTY ANTERIOR APPROACH;  Surgeon: Ollen Gross, MD;  Location: [facility];  Service: Orthopedics;  Laterality: Right;  . TOTAL HIP ARTHROPLASTY Left about 10 to 12 years ago  . TOTAL KNEE ARTHROPLASTY Left 09/04/2012   Procedure: TOTAL KNEE ARTHROPLASTY;  Surgeon: Loanne Drilling, MD;  Location: [facility];  Service: Orthopedics;  Laterality: Left;  . TOTAL KNEE ARTHROPLASTY Right 01/24/2020   Procedure: TOTAL KNEE ARTHROPLASTY;  Surgeon: Ollen Gross, MD;  Location: [facility];  Service: Orthopedics;  Laterality: Right;    Social History:  reports that he has never smoked. He has been exposed to tobacco smoke. He has never used smokeless tobacco. He reports that he does not drink alcohol and does not use drugs.  Allergies  Allergen Reactions  . Crestor [Rosuvastatin] Other (See Comments)    myalgias  . Valsartan Other (See Comments)    Made patient feel tired and no energy  . Warfarin And Related Other (See Comments)    Fast heart beat, went down hill, felt like he was dying    Family History  Problem Relation Age of Onset  . Breast cancer Mother   . Diabetes Mother   . Prostate cancer Father   . Colon cancer Neg Hx   . Colon polyps Neg Hx   . Esophageal cancer Neg Hx   . Rectal cancer Neg Hx   . Stomach cancer Neg Hx   . Ulcerative colitis Neg Hx   . Crohn's disease Neg Hx     Prior  to Admission medications   Medication Sig Start Date End Date Taking? Authorizing Provider  aspirin EC 81 MG tablet Take 1 tablet (81 mg total) by mouth daily. Swallow whole. 06/13/20   Croitoru, Mihai, MD  DULoxetine (CYMBALTA) 60 MG capsule TAKE 1 CAPSULE BY MOUTH EVERY DAY 08/22/22   Georgina Quint, MD  hydrochlorothiazide (MICROZIDE) 12.5 MG capsule TAKE 1 CAPSULE BY MOUTH EVERY DAY (NEED OFFICE VISIT) 01/10/23    Croitoru, Rachelle Hora, MD  losartan (COZAAR) 100 MG tablet TAKE 1 TABLET BY MOUTH EVERY DAY PT. WILL NEED TO MAKE AN APPOINTMENT IN ORDER TO RECEIVE FUTURE REFILLS. FIRST ATTEMPT. 01/08/23   Croitoru, Rachelle Hora, MD  meloxicam (MOBIC) 7.5 MG tablet TAKE 1 TABLET BY MOUTH EVERY DAY 12/31/22   Georgina Quint, MD  metoprolol succinate (TOPROL-XL) 50 MG 24 hr tablet TAKE 1 TABLET BY MOUTH DAILY WITH OR IMMEDIATELY FOLLOWING A MEAL 09/03/22   Croitoru, Rachelle Hora, MD    Physical Exam: Vitals:   02/10/23 2002 02/10/23 2003 02/10/23 2012 02/10/23 2045  BP: (!) 179/79   (!) 154/74  Pulse: 69  77 67  Resp: 20  19 16   Temp: 98 F (36.7 C)     TempSrc: Oral     SpO2: 95%  98% 95%  Weight:  117.6 kg    Height:  6' (1.829 m)     *** Data Reviewed: {Tip this will not be part of the note when signed- Document your independent interpretation of telemetry tracing, EKG, lab, Radiology test or any other diagnostic tests. Add any new diagnostic test ordered today. (Optional):26781} {Results:26384}  Assessment and Plan: No notes have been filed under this hospital service. Service: Hospitalist     Advance Care Planning:   Code Status: Prior ***  Consults: ***  Family Communication: ***  Severity of Illness: {Observation/Inpatient:21159}  Author: Frankey Shown, DO 02/10/2023 9:28 PM  For on call review [email or web address].

## 2023-02-10 NOTE — H&P (Incomplete)
History and Physical    Patient: Samuel Chambers ZOX:[id number] DOB: 1950/12/06 DOA: 02/10/2023 DOS: the patient was seen and examined on 02/11/2023 PCP: Georgina Quint, MD  Patient coming from: Home  Chief Complaint:  Chief Complaint  Patient presents with   Possible TIA   HPI: Samuel Chambers is a 72 y.o. male with medical history significant of hypertension, CAD s/p stent placement who presents to the emergency department via EMS due to a transient slurred speech.  Patient was last well-known at 82.  History was obtained from ED physician and ED medical record.  Per report, patient was laid in a Bible study and was standing in front of the Bible study group when wife noted that he sounded thick tongued and was slurring his words, on closer look at patient, he was noted to have right facial droop, due to his smile appears symmetric.  Patient had no weakness of extremities or difficulty in being able to ambulate, did he report any vision changes or any numbness or tingling of extremities.  EMS was activated and by the time EMS team arrived, symptoms already resolved.  Symptoms was reported to have lasted about 5 to 10 minutes.  Blood glucose on arrival of EMS team was 126.  ED Course:  In the emergency department, BP was 154/74, but other vital signs were within normal range.  Workup in the ED showed normal CBC and BMP except for blood glucose of 135.  Urinalysis was normal, urine drug screen was normal, alcohol level was less than 10. CT head without contrast showed no acute rectal abnormality Teleneurologist was consulted and recommended aspirin and Plavix, this was given in the ED.  Further stroke workup was also recommended.  Hospitalist was asked to admit patient for further evaluation and management.  Review of Systems: Review of systems as noted in the HPI. All other systems reviewed and are negative.   Past Medical History:  Diagnosis Date   Adenomatous colon polyp  03/2005   Anxiety    CAD S/P percutaneous coronary angioplasty 2008   PCI to circumflex with a Promus DES 2.5 mm x 8 mm   Cataract    BILATERAL,REMOVED   Degenerative joint disease    Diabetes mellitus without complication (HCC)    diet controlled,PT.DENIES UPDATED 04/10/22   Diverticulosis    Fatty liver 2016   pt denies   Fibromyalgia    GERD (gastroesophageal reflux disease)    Heart murmur    Phreesia 07/29/2020   Hyperlipidemia    statin intolerant   Hypertension    Internal hemorrhoids    Obesity, Class II, BMI 35-39.9    Pneumonia 2016   PONV (postoperative nausea and vomiting)    Past Surgical History:  Procedure Laterality Date   CHOLECYSTECTOMY N/A 02/18/2013   Procedure: LAPAROSCOPIC CHOLECYSTECTOMY WITH INTRAOPERATIVE CHOLANGIOGRAM;  Surgeon: Valarie Merino, MD;  Location: [facility];  Service: General;  Laterality: N/A;   COLONOSCOPY     COLONOSCOPY  05/27/2022   CORONARY ANGIOPLASTY WITH STENT PLACEMENT  04/30/2007   2.5 mm Promus stent that was to 95% Circ;had 60-70% lesion to RCA   DOPPLER ECHOCARDIOGRAPHY  05/27/2002   [facility]normal EF 55-66%,   FOOT SURGERY Right    x2 fascia   HEEL SPUR SURGERY Right    HEMORRHOID SURGERY     Holter Monitor  04/07/2007   sinus tachy.;   JOINT REPLACEMENT N/A    Phreesia 07/29/2020   KNEE ARTHROSCOPY  03/27/2012  Procedure: ARTHROSCOPY KNEE;  Surgeon: Loanne Drilling, MD;  Location: [facility];  Service: Orthopedics;  Laterality: Left;   NM MYOCAR PERF WALL MOTION  12/19/2011   EXERCISED FOR 8-1/2 MINUTES RECHING 10 METABOLIC EQUIVALENTS. NO EVIDENCE  OF ISCHEMIA OR INFARCTION . EF 71%   POLYPECTOMY     renal dopplers  04/08/2007   relatively normal   TOTAL HIP ARTHROPLASTY Right 03/16/2018   Procedure: RIGHT TOTAL HIP ARTHROPLASTY ANTERIOR APPROACH;  Surgeon: Ollen Gross, MD;  Location: [facility];  Service: Orthopedics;  Laterality: Right;   TOTAL HIP ARTHROPLASTY Left about 10 to 12 years ago   TOTAL KNEE  ARTHROPLASTY Left 09/04/2012   Procedure: TOTAL KNEE ARTHROPLASTY;  Surgeon: Loanne Drilling, MD;  Location: [facility];  Service: Orthopedics;  Laterality: Left;   TOTAL KNEE ARTHROPLASTY Right 01/24/2020   Procedure: TOTAL KNEE ARTHROPLASTY;  Surgeon: Ollen Gross, MD;  Location: [facility];  Service: Orthopedics;  Laterality: Right;     Social History:  reports that he has never smoked. He has been exposed to tobacco smoke. He has never used smokeless tobacco. He reports that he does not drink alcohol and does not use drugs.   Allergies  Allergen Reactions   Crestor [Rosuvastatin] Other (See Comments)    myalgias   Valsartan Other (See Comments)    Made patient feel tired and no energy   Warfarin And Related Other (See Comments)    Fast heart beat, went down hill, felt like he was dying    Family History  Problem Relation Age of Onset   Breast cancer Mother    Diabetes Mother    Prostate cancer Father    Colon cancer Neg Hx    Colon polyps Neg Hx    Esophageal cancer Neg Hx    Rectal cancer Neg Hx    Stomach cancer Neg Hx    Ulcerative colitis Neg Hx    Crohn's disease Neg Hx      Prior to Admission medications   Medication Sig Start Date End Date Taking? Authorizing Provider  aspirin EC 81 MG tablet Take 1 tablet (81 mg total) by mouth daily. Swallow whole. 06/13/20   Croitoru, Mihai, MD  DULoxetine (CYMBALTA) 60 MG capsule TAKE 1 CAPSULE BY MOUTH EVERY DAY 08/22/22   Georgina Quint, MD  hydrochlorothiazide (MICROZIDE) 12.5 MG capsule TAKE 1 CAPSULE BY MOUTH EVERY DAY (NEED OFFICE VISIT) 01/10/23   Croitoru, Rachelle Hora, MD  losartan (COZAAR) 100 MG tablet TAKE 1 TABLET BY MOUTH EVERY DAY PT. WILL NEED TO MAKE AN APPOINTMENT IN ORDER TO RECEIVE FUTURE REFILLS. FIRST ATTEMPT. 01/08/23   Croitoru, Rachelle Hora, MD  meloxicam (MOBIC) 7.5 MG tablet TAKE 1 TABLET BY MOUTH EVERY DAY 12/31/22   Georgina Quint, MD  metoprolol succinate (TOPROL-XL) 50 MG 24 hr tablet TAKE 1 TABLET BY  MOUTH DAILY WITH OR IMMEDIATELY FOLLOWING A MEAL 09/03/22   Croitoru, Mihai, MD    Physical Exam: BP (!) 166/90   Pulse 63   Temp 98.4 F (36.9 C) (Oral)   Resp 19   Ht 6' (1.829 m)   Wt 117.6 kg   SpO2 94%   BMI 35.15 kg/m   General: 72 y.o. year-old male well developed well nourished in no acute distress.  Alert and oriented x3. HEENT: NCAT, EOMI Neck: Supple, trachea medial Cardiovascular: Regular rate and rhythm with no rubs or gallops.  No thyromegaly or JVD noted.  No lower extremity edema. 2/4 pulses in all 4 extremities.  Respiratory: Clear to auscultation with no wheezes or rales. Good inspiratory effort. Abdomen: Soft, nontender nondistended with normal bowel sounds x4 quadrants. Muskuloskeletal: Left hand with bandage (s/p trigger finger surgery) no cyanosis, clubbing or edema noted bilaterally Neuro: CN II-XII intact, strength 5/5 x 4, sensation, reflexes intact Skin: No ulcerative lesions noted or rashes Psychiatry: Judgement and insight appear normal. Mood is appropriate for condition and setting          Labs on Admission:  Basic Metabolic Panel: Recent Labs  Lab 02/10/23 2008  NA 136  K 3.9  CL 101  CO2 26  GLUCOSE 135*  BUN 15  CREATININE 0.98  CALCIUM 9.3   Liver Function Tests: Recent Labs  Lab 02/10/23 2008  AST 32  ALT 33  ALKPHOS 50  BILITOT 0.5  PROT 6.8  ALBUMIN 3.9   No results for input(s): "LIPASE", "AMYLASE" in the last 168 hours. No results for input(s): "AMMONIA" in the last 168 hours. CBC: Recent Labs  Lab 02/10/23 2008  WBC 9.4  NEUTROABS 5.3  HGB 13.6  HCT 39.8  MCV 85.6  PLT 208   Cardiac Enzymes: No results for input(s): "CKTOTAL", "CKMB", "CKMBINDEX", "TROPONINI" in the last 168 hours.  BNP (last 3 results) No results for input(s): "BNP" in the last 8760 hours.  ProBNP (last 3 results) No results for input(s): "PROBNP" in the last 8760 hours.  CBG: No results for input(s): "GLUCAP" in the last 168  hours.  Radiological Exams on Admission: CT HEAD WO CONTRAST  Result Date: 02/10/2023 CLINICAL DATA:  Facial droop, dysarthria EXAM: CT HEAD WITHOUT CONTRAST TECHNIQUE: Contiguous axial images were obtained from the base of the skull through the vertex without intravenous contrast. RADIATION DOSE REDUCTION: This exam was performed according to the departmental dose-optimization program which includes automated exposure control, adjustment of the mA and/or kV according to patient size and/or use of iterative reconstruction technique. COMPARISON:  None Available. FINDINGS: Brain: No evidence of acute infarction, hemorrhage, hydrocephalus, extra-axial collection or mass lesion/mass effect. Vascular: No hyperdense vessel or unexpected calcification. Skull: Normal. Negative for fracture or focal lesion. Sinuses/Orbits: No acute finding. Other: Mastoid air cells and middle ear cavities are clear IMPRESSION: 1. No acute intracranial abnormality. Electronically Signed   By: Helyn Numbers M.D.   On: 02/10/2023 20:34    EKG: I independently viewed the EKG done and my findings are as followed: Normal sinus rhythm at rate of 60 bpm  Assessment/Plan Present on Admission:  TIA (transient ischemic attack)  Essential hypertension  Principal Problem:   TIA (transient ischemic attack) Active Problems:   Essential hypertension  Transient ischemic attack Patient will be admitted to telemetry unit  Echocardiogram in the morning MRI of brain without contrast in the morning MRA head and neck Aspirin 325 milligram x 1 and Plavix 300 mg x 1 was given Continue aspirin 81 mg and Plavix 75 mg daily, to be followed by monotherapy thereafter Continue fall precautions and neuro checks Lipid panel, TSH and hemoglobin A1c will be checked Continue PT/SLP/OT eval and treat Bedside swallow eval by nursing prior to diet Teleneurology will be consulted and we shall await further recommendations  Essential hypertension   Antihypertensives PRN if Blood pressure is greater than 220/120 or there is a concern for End organ damage/contraindications for permissive HTN. If blood pressure is greater than 220/120 give labetalol PO or IV or Vasotec IV with a goal of 15% reduction in BP during the first 24 hours.  Left and status  post trigger finger surgery Continue wound care  DVT prophylaxis: SCDs   Advance Care Planning: Full code  Consults: Teleneurology  Family Communication: Wife at bedside (all questions answered to satisfaction)  Severity of Illness: The appropriate patient status for this patient is OBSERVATION. Observation status is judged to be reasonable and necessary in order to provide the required intensity of service to ensure the patient's safety. The patient's presenting symptoms, physical exam findings, and initial radiographic and laboratory data in the context of their medical condition is felt to place them at decreased risk for further clinical deterioration. Furthermore, it is anticipated that the patient will be medically stable for discharge from the hospital within 2 midnights of admission.   Author: Frankey Shown, DO 02/11/2023 1:11 AM  For on call review [email or web address].

## 2023-02-10 NOTE — ED Provider Notes (Signed)
[place] EMERGENCY DEPARTMENT AT [facility] Provider Note   CSN: [id number] Arrival date & time: 02/10/23  1952     History {Add pertinent medical, surgical, social history, OB history to HPI:1} No chief complaint on file.   Ernest Popowski is a 72 y.o. male.  He is brought in by EMS for an acute neurologic event.  He has a history of diabetes coronary disease stents and recently had trigger finger surgery on his left hand.  He said he was helping his wife with a church function tonight when around 7 PM she noticed that his face was drooped on the right and that his speech was not clear.  He said he thought his speech was fine.  By the time EMS evaluated him he did not have any deficits.  It sounds like this may have been 30 minutes.  He has never had anything like this before.  He denies any headache blurry vision.  He has had some numbness in his left arm since his surgery and just recently got taken out of his splint.  No difficulty with balance.  The history is provided by the patient and the EMS personnel.  Cerebrovascular Accident This is a new problem. The current episode started less than 1 hour ago. The problem has been resolved. Pertinent negatives include no chest pain, no abdominal pain, no headaches and no shortness of breath. Nothing aggravates the symptoms. Nothing relieves the symptoms. He has tried nothing for the symptoms. The treatment provided significant relief.       Home Medications Prior to Admission medications   Medication Sig Start Date End Date Taking? Authorizing Provider  aspirin EC 81 MG tablet Take 1 tablet (81 mg total) by mouth daily. Swallow whole. 06/13/20   Croitoru, Mihai, MD  DULoxetine (CYMBALTA) 60 MG capsule TAKE 1 CAPSULE BY MOUTH EVERY DAY 08/22/22   Georgina Quint, MD  hydrochlorothiazide (MICROZIDE) 12.5 MG capsule TAKE 1 CAPSULE BY MOUTH EVERY DAY (NEED OFFICE VISIT) 01/10/23   Croitoru, Rachelle Hora, MD  losartan (COZAAR) 100  MG tablet TAKE 1 TABLET BY MOUTH EVERY DAY PT. WILL NEED TO MAKE AN APPOINTMENT IN ORDER TO RECEIVE FUTURE REFILLS. FIRST ATTEMPT. 01/08/23   Croitoru, Rachelle Hora, MD  meloxicam (MOBIC) 7.5 MG tablet TAKE 1 TABLET BY MOUTH EVERY DAY 12/31/22   Georgina Quint, MD  metoprolol succinate (TOPROL-XL) 50 MG 24 hr tablet TAKE 1 TABLET BY MOUTH DAILY WITH OR IMMEDIATELY FOLLOWING A MEAL 09/03/22   Croitoru, Mihai, MD      Allergies    Crestor [rosuvastatin], Valsartan, and Warfarin and related    Review of Systems   Review of Systems  Constitutional:  Negative for fever.  Eyes:  Negative for visual disturbance.  Respiratory:  Negative for shortness of breath.   Cardiovascular:  Negative for chest pain.  Gastrointestinal:  Negative for abdominal pain.  Neurological:  Positive for facial asymmetry and speech difficulty. Negative for headaches.    Physical Exam Updated Vital Signs BP (!) 179/79 (BP Location: Right Arm)   Pulse 69   Temp 98 F (36.7 C) (Oral)   Resp 20   Ht 6' (1.829 m)   Wt 117.6 kg   SpO2 95%   BMI 35.15 kg/m  Physical Exam Vitals and nursing note reviewed.  Constitutional:      General: He is not in acute distress.    Appearance: Normal appearance. He is well-developed.  HENT:     Head: Normocephalic and atraumatic.  Eyes:     Conjunctiva/sclera: Conjunctivae normal.  Cardiovascular:     Rate and Rhythm: Normal rate and regular rhythm.     Heart sounds: No murmur heard. Pulmonary:     Effort: Pulmonary effort is normal. No respiratory distress.     Breath sounds: Normal breath sounds.  Abdominal:     Palpations: Abdomen is soft.     Tenderness: There is no abdominal tenderness. There is no guarding or rebound.  Musculoskeletal:        General: No swelling.     Cervical back: Neck supple.  Skin:    General: Skin is warm and dry.     Capillary Refill: Capillary refill takes less than 2 seconds.  Neurological:     General: No focal deficit present.      Mental Status: He is alert and oriented to person, place, and time.     Cranial Nerves: No cranial nerve deficit.     Sensory: No sensory deficit.     Motor: No weakness.     Comments: Patient's exam is somewhat limited on his left upper extremity due to recent surgery.     ED Results / Procedures / Treatments   Labs (all labs ordered are listed, but only abnormal results are displayed) Labs Reviewed  ETHANOL  PROTIME-INR  APTT  CBC  DIFFERENTIAL  COMPREHENSIVE METABOLIC PANEL  RAPID URINE DRUG SCREEN, HOSP PERFORMED  URINALYSIS, ROUTINE W REFLEX MICROSCOPIC    EKG None  Radiology No results found.  Procedures Procedures  {Document cardiac monitor, telemetry assessment procedure when appropriate:1}  Medications Ordered in ED Medications - No data to display  ED Course/ Medical Decision Making/ A&P   {   Click here for ABCD2, HEART and other calculatorsREFRESH Note before signing :1}                          Medical Decision Making Amount and/or Complexity of Data Reviewed Labs: ordered. Radiology: ordered.   This patient complains of ***; this involves an extensive number of treatment Options and is a complaint that carries with it a high risk of complications and morbidity. The differential includes ***  I ordered, reviewed and interpreted labs, which included *** I ordered medication *** and reviewed PMP when indicated. I ordered imaging studies which included *** and I independently    visualized and interpreted imaging which showed *** Additional history obtained from *** Previous records obtained and reviewed *** I consulted *** and discussed lab and imaging findings and discussed disposition.  Cardiac monitoring reviewed, *** Social determinants considered, *** Critical Interventions: ***  After the interventions stated above, I reevaluated the patient and found *** Admission and further testing considered, ***   {Document critical care time when  appropriate:1} {Document review of labs and clinical decision tools ie heart score, Chads2Vasc2 etc:1}  {Document your independent review of radiology images, and any outside records:1} {Document your discussion with family members, caretakers, and with consultants:1} {Document social determinants of health affecting pt's care:1} {Document your decision making why or why not admission, treatments were needed:1} Final Clinical Impression(s) / ED Diagnoses Final diagnoses:  None    Rx / DC Orders ED Discharge Orders     None

## 2023-02-10 NOTE — ED Notes (Signed)
Patient transported to CT 

## 2023-02-10 NOTE — ED Notes (Signed)
Pt ambulated to restroom for BM.  

## 2023-02-10 NOTE — ED Notes (Signed)
 Provider at bedside

## 2023-02-11 ENCOUNTER — Observation Stay (HOSPITAL_COMMUNITY): Payer: Medicare Other

## 2023-02-11 DIAGNOSIS — G459 Transient cerebral ischemic attack, unspecified: Secondary | ICD-10-CM | POA: Diagnosis not present

## 2023-02-11 LAB — LIPID PANEL
Cholesterol: 172 mg/dL (ref 0–200)
HDL: 32 mg/dL — ABNORMAL LOW (ref >40)
LDL Cholesterol: 105 mg/dL — ABNORMAL HIGH (ref 0–99)
Total CHOL/HDL Ratio: 5.4 RATIO
Triglycerides: 173 mg/dL — ABNORMAL HIGH (ref <150)
VLDL: 35 mg/dL (ref 0–40)

## 2023-02-11 LAB — COMPREHENSIVE METABOLIC PANEL
ALT: 31 U/L (ref 0–44)
AST: 27 U/L (ref 15–41)
Albumin: 3.8 g/dL (ref 3.5–5.0)
Alkaline Phosphatase: 45 U/L (ref 38–126)
Anion gap: 11 (ref 5–15)
BUN: 14 mg/dL (ref 8–23)
CO2: 24 mmol/L (ref 22–32)
Calcium: 9.2 mg/dL (ref 8.9–10.3)
Chloride: 103 mmol/L (ref 98–111)
Creatinine, Ser: 0.95 mg/dL (ref 0.61–1.24)
GFR, Estimated: >60 mL/min (ref >60)
Glucose, Bld: 111 mg/dL — ABNORMAL HIGH (ref 70–99)
Potassium: 3.7 mmol/L (ref 3.5–5.1)
Sodium: 138 mmol/L (ref 135–145)
Total Bilirubin: 0.6 mg/dL (ref 0.3–1.2)
Total Protein: 6.6 g/dL (ref 6.5–8.1)

## 2023-02-11 LAB — ECHOCARDIOGRAM COMPLETE
AR max vel: 1.03 cm2
AV Area VTI: 1.15 cm2
AV Area mean vel: 1.03 cm2
AV Mean grad: 22 mmHg
AV Peak grad: 41.7 mmHg
Ao pk vel: 3.23 m/s
Area-P 1/2: 2.5 cm2
Height: 72 in
MV M vel: 0.95 m/s
MV Peak grad: 3.6 mmHg
S' Lateral: 3.1 cm
Weight: 4147.2 oz

## 2023-02-11 LAB — CBC
HCT: 38.4 % — ABNORMAL LOW (ref 39.0–52.0)
Hemoglobin: 13.2 g/dL (ref 13.0–17.0)
MCH: 29.4 pg (ref 26.0–34.0)
MCHC: 34.4 g/dL (ref 30.0–36.0)
MCV: 85.5 fL (ref 80.0–100.0)
Platelets: 205 10*3/uL (ref 150–400)
RBC: 4.49 MIL/uL (ref 4.22–5.81)
RDW: 13.5 % (ref 11.5–15.5)
WBC: 9.6 10*3/uL (ref 4.0–10.5)
nRBC: 0 % (ref 0.0–0.2)

## 2023-02-11 LAB — HEMOGLOBIN A1C
Hgb A1c MFr Bld: 6.2 % — ABNORMAL HIGH (ref 4.8–5.6)
Mean Plasma Glucose: 131.24 mg/dL

## 2023-02-11 LAB — TSH: TSH: 5.12 u[IU]/mL — ABNORMAL HIGH (ref 0.350–4.500)

## 2023-02-11 MED ORDER — GADOBUTROL 1 MMOL/ML IV SOLN
10.0000 mL | Freq: Once | INTRAVENOUS | Status: AC | PRN
  Administered 2023-02-11: 10 mL via INTRAVENOUS

## 2023-02-11 MED ORDER — ASPIRIN EC 81 MG PO TBEC: 81.0000 mg | DELAYED_RELEASE_TABLET | Freq: Every day | ORAL | 0 refills | Status: DC

## 2023-02-11 MED ORDER — CLOPIDOGREL BISULFATE 75 MG PO TABS
75.0000 mg | ORAL_TABLET | Freq: Every day | ORAL | Status: DC
  Administered 2023-02-11: 75 mg via ORAL
  Filled 2023-02-11: qty 1

## 2023-02-11 MED ORDER — AMLODIPINE BESYLATE 5 MG PO TABS
10.0000 mg | ORAL_TABLET | Freq: Every day | ORAL | Status: DC
  Administered 2023-02-11: 10 mg via ORAL
  Filled 2023-02-11: qty 2

## 2023-02-11 MED ORDER — ONDANSETRON HCL 4 MG PO TABS: 4.0000 mg | ORAL_TABLET | Freq: Four times a day (QID) | ORAL | Status: DC | PRN

## 2023-02-11 MED ORDER — METOPROLOL SUCCINATE ER 50 MG PO TB24: 50.0000 mg | ORAL_TABLET | Freq: Every day | ORAL | 3 refills | Status: DC

## 2023-02-11 MED ORDER — ACETAMINOPHEN 325 MG PO TABS: 650.0000 mg | ORAL_TABLET | Freq: Four times a day (QID) | ORAL | Status: DC | PRN

## 2023-02-11 MED ORDER — ASPIRIN 81 MG PO TBEC
81.0000 mg | DELAYED_RELEASE_TABLET | Freq: Every day | ORAL | Status: DC
  Administered 2023-02-11: 81 mg via ORAL
  Filled 2023-02-11: qty 1

## 2023-02-11 MED ORDER — LOSARTAN POTASSIUM 100 MG PO TABS: 100.0000 mg | ORAL_TABLET | Freq: Every day | ORAL | 3 refills | Status: DC

## 2023-02-11 MED ORDER — CLOPIDOGREL BISULFATE 75 MG PO TABS: 75.0000 mg | ORAL_TABLET | Freq: Every day | ORAL | 11 refills | Status: DC

## 2023-02-11 MED ORDER — ACETAMINOPHEN 650 MG RE SUPP: 650.0000 mg | Freq: Four times a day (QID) | RECTAL | Status: DC | PRN

## 2023-02-11 MED ORDER — ONDANSETRON HCL 4 MG/2ML IJ SOLN: 4.0000 mg | Freq: Four times a day (QID) | INTRAMUSCULAR | Status: DC | PRN

## 2023-02-11 NOTE — Progress Notes (Signed)
  Echocardiogram 2D Echocardiogram has been performed.  Maren Reamer 02/11/2023, 10:18 AM

## 2023-02-11 NOTE — Progress Notes (Signed)
SLP Cancellation Note  Patient Details Name: Samuel Chambers MRN: [id number] DOB: Dec 09, 1950   Cancelled treatment:       Reason Eval/Treat Not Completed: Other (comment) (Pt passed [facility] and RN reports no difficulties with PO diet. SLP will sign off. Reconsult if indicated.)  Thank you,  Havery Moros, CCC-SLP [phone]  Zahria Ding 02/11/2023, 10:45 AM

## 2023-02-11 NOTE — Discharge Summary (Signed)
Samuel Chambers, is a 72 y.o. male  DOB 12-08-1950  MRN [id number].  Admission date:  02/10/2023  Admitting Physician  Frankey Shown, DO  Discharge Date:  02/11/2023   Primary MD  Georgina Quint, MD  Recommendations for primary care physician for things to follow:   1)Stop Meloxicam/Mobic--- 2)Avoid ibuprofen/Advil/Aleve/Motrin/Goody Powders/Naproxen/BC powders/Meloxicam/Diclofenac/Indomethacin and other Nonsteroidal anti-inflammatory medications as these will make you more likely to bleed and can cause stomach ulcers, can also cause Kidney problems.  3)Please take Aspirin 81 mg daily along with Plavix 75 mg daily for 21 days then after that STOP the Aspirin  and continue ONLY Plavix 75 mg daily indefinitely--for secondary stroke Prevention  4)Check your Blood Pressure about every other day and keep a Blood Pressure Diary/record 5)Low Salt Diet ---Less than 2 gm per day advised 6)Follow up with Neurologist Dr. Janalyn Shy [facility] [address], [place], [place] [address], Phone-[phone]) please talk to your cardiologist about Repatha--- for cholesterol  Admission Diagnosis  TIA (transient ischemic attack) [G45.9]  Discharge Diagnosis  TIA (transient ischemic attack) [G45.9]   Principal Problem:   TIA (transient ischemic attack) Active Problems:   Essential hypertension      Past Medical History:  Diagnosis Date   Adenomatous colon polyp 03/2005   Anxiety    CAD S/P percutaneous coronary angioplasty 2008   PCI to circumflex with a Promus DES 2.5 mm x 8 mm   Cataract    BILATERAL,REMOVED   Degenerative joint disease    Diabetes mellitus without complication (HCC)    diet controlled,PT.DENIES UPDATED 04/10/22   Diverticulosis    Fatty liver 2016   pt denies   Fibromyalgia    GERD (gastroesophageal reflux disease)    Heart murmur     Phreesia 07/29/2020   Hyperlipidemia    statin intolerant   Hypertension    Internal hemorrhoids    Obesity, Class II, BMI 35-39.9    Pneumonia 2016   PONV (postoperative nausea and vomiting)     Past Surgical History:  Procedure Laterality Date   CHOLECYSTECTOMY N/A 02/18/2013   Procedure: LAPAROSCOPIC CHOLECYSTECTOMY WITH INTRAOPERATIVE CHOLANGIOGRAM;  Surgeon: Valarie Merino, MD;  Location: [facility];  Service: General;  Laterality: N/A;   COLONOSCOPY     COLONOSCOPY  05/27/2022   CORONARY ANGIOPLASTY WITH STENT PLACEMENT  04/30/2007   2.5 mm Promus stent that was to 95% Circ;had 60-70% lesion to RCA   DOPPLER ECHOCARDIOGRAPHY  05/27/2002   [facility]normal EF 55-66%,   FOOT SURGERY Right    x2 fascia   HEEL SPUR SURGERY Right    HEMORRHOID SURGERY     Holter Monitor  04/07/2007   sinus tachy.;   JOINT REPLACEMENT N/A    Phreesia 07/29/2020   KNEE ARTHROSCOPY  03/27/2012   Procedure: ARTHROSCOPY KNEE;  Surgeon: Loanne Drilling, MD;  Location: [facility];  Service: Orthopedics;  Laterality: Left;   NM MYOCAR PERF WALL MOTION  12/19/2011   EXERCISED FOR 8-1/2 MINUTES RECHING 10 METABOLIC EQUIVALENTS.  NO EVIDENCE  OF ISCHEMIA OR INFARCTION . EF 71%   POLYPECTOMY     renal dopplers  04/08/2007   relatively normal   TOTAL HIP ARTHROPLASTY Right 03/16/2018   Procedure: RIGHT TOTAL HIP ARTHROPLASTY ANTERIOR APPROACH;  Surgeon: Ollen Gross, MD;  Location: [facility];  Service: Orthopedics;  Laterality: Right;   TOTAL HIP ARTHROPLASTY Left about 10 to 12 years ago   TOTAL KNEE ARTHROPLASTY Left 09/04/2012   Procedure: TOTAL KNEE ARTHROPLASTY;  Surgeon: Loanne Drilling, MD;  Location: [facility];  Service: Orthopedics;  Laterality: Left;   TOTAL KNEE ARTHROPLASTY Right 01/24/2020   Procedure: TOTAL KNEE ARTHROPLASTY;  Surgeon: Ollen Gross, MD;  Location: [facility];  Service: Orthopedics;  Laterality: Right;      HPI  from the history and physical done on the day of admission:     HPI: Samuel Chambers is a 72 y.o. male with medical history significant of hypertension, CAD s/p stent placement who presents to the emergency department via EMS due to a transient slurred speech.  Patient was last well-known at 29.  History was obtained from ED physician and ED medical record.  Per report, patient was laid in a Bible study and was standing in front of the Bible study group when wife noted that he sounded thick tongued and was slurring his words, on closer look at patient, he was noted to have right facial droop, due to his smile appears symmetric.  Patient had no weakness of extremities or difficulty in being able to ambulate, did he report any vision changes or any numbness or tingling of extremities.  EMS was activated and by the time EMS team arrived, symptoms already resolved.  Symptoms was reported to have lasted about 5 to 10 minutes.  Blood glucose on arrival of EMS team was 126.   ED Course:  In the emergency department, BP was 154/74, but other vital signs were within normal range.  Workup in the ED showed normal CBC and BMP except for blood glucose of 135.  Urinalysis was normal, urine drug screen was normal, alcohol level was less than 10. CT head without contrast showed no acute rectal abnormality Teleneurologist was consulted and recommended aspirin and Plavix, this was given in the ED.  Further stroke workup was also recommended.  Hospitalist was asked to admit patient for further evaluation and management.   Review of Systems: Review of systems as noted in the HPI. All other systems reviewed and are negative.    Hospital Course:   1)Transient ischemic attack CT head, MRI of brain without contrast and MRA head and neck--without acute findings, no acute stroke no LVO -Echo with EF of 60 to 65% up from 55 to 60 % previously, grade 1 diastolic dysfunction noted which is not new -A1c 6.2,  --LDL 105,, HDL 32, 152 -Give Aspirin 81 mg daily along with Plavix 75 mg  daily for 21 days then after that STOP the Aspirin  and continue ONLY Plavix 75 mg daily indefinitely--for secondary stroke Prevention --(Per The multicenter SAMMPRIS trial) -Patient states he will not take statin drugs----he apparently gets very severe myalgias -Patient is strongly advised to follow-up with his cardiologist to discuss use of Repatha-- -again patient declines statins Rx  2)HTN--continue losartan and metoprolol -Keep BP diary and follow-up with PCP and neurologist for BP meds adjustment  3)Class 2 Obesity-- -Low calorie diet, portion control and increase physical activity discussed with patient -Body mass index is 35.15 kg/m.  4)CAD--- s/p  prior angioplasty and stent placement in 2008 -Currently chest pain-free -Continue aspirin/Plavix and metoprolol as above -Patient declines statins -He will talk to his cardiologist about Repatha   Discharge Condition: Stable  Follow UP   Follow-up Information     Micki Riley, MD. Schedule an appointment as soon as possible for a visit in 1 month(s).   Specialties: Neurology, Radiology Contact information: [address] [place] [address] [phone]                 Consults obtained -neurology  Diet and Activity recommendation:  As advised  Discharge Instructions    Discharge Instructions     Call MD for:  difficulty breathing, headache or visual disturbances   Complete by: As directed    Call MD for:  persistant dizziness or light-headedness   Complete by: As directed    Call MD for:  persistant nausea and vomiting   Complete by: As directed    Call MD for:  temperature >100.4   Complete by: As directed    Diet - low sodium heart healthy   Complete by: As directed    Discharge instructions   Complete by: As directed    1)Stop Meloxicam/Mobic--- 2)Avoid ibuprofen/Advil/Aleve/Motrin/Goody Powders/Naproxen/BC powders/Meloxicam/Diclofenac/Indomethacin and other Nonsteroidal  anti-inflammatory medications as these will make you more likely to bleed and can cause stomach ulcers, can also cause Kidney problems.  3)Please take Aspirin 81 mg daily along with Plavix 75 mg daily for 21 days then after that STOP the Aspirin  and continue ONLY Plavix 75 mg daily indefinitely--for secondary stroke Prevention  4)Check your Blood Pressure about every other day and keep a Blood Pressure Diary/record 5)Low Salt Diet ---Less than 2 gm per day advised 6)Follow up with Neurologist Dr. Janalyn Shy [facility] [address], [address], [place], [place] [address], Phone-[phone]) please talk to your cardiologist about Repatha--- for cholesterol   Increase activity slowly   Complete by: As directed    No wound care   Complete by: As directed        Discharge Medications     Allergies as of 02/11/2023       Reactions   Crestor [rosuvastatin] Other (See Comments)   myalgias   Valsartan Other (See Comments)   Made patient feel tired and no energy   Warfarin And Related Other (See Comments)   Fast heart beat, went down hill, felt like he was dying        Medication List     STOP taking these medications    hydrochlorothiazide 12.5 MG capsule Commonly known as: MICROZIDE   meloxicam 7.5 MG tablet Commonly known as: MOBIC       TAKE these medications    aspirin EC 81 MG tablet Take 1 tablet (81 mg total) by mouth daily with breakfast. Swallow whole. What changed: when to take this   clopidogrel 75 MG tablet Commonly known as: Plavix Take 1 tablet (75 mg total) by mouth daily.   DULoxetine 60 MG capsule Commonly known as: CYMBALTA TAKE 1 CAPSULE BY MOUTH EVERY DAY What changed: how much to take   losartan 100 MG tablet Commonly known as: COZAAR Take 1 tablet (100 mg total) by mouth daily.   metoprolol succinate 50 MG 24 hr tablet Commonly known as: TOPROL-XL Take 1 tablet (50 mg total) by mouth daily. What  changed: See the new instructions.       Major procedures and Radiology Reports - PLEASE review  detailed and final reports for all details, in brief -  ECHOCARDIOGRAM COMPLETE  Result Date: 02/11/2023    ECHOCARDIOGRAM REPORT   Patient Name:   Samuel Chambers Date of Exam: 02/11/2023 Medical Rec #:  [id number]          Height:       72.0 in Accession #:    [id number]         Weight:       259.2 lb Date of Birth:  [birth year]           BSA:          2.379 m Patient Age:    71 years           BP:           170/88 mmHg Patient Gender: M                  HR:           75 bpm. [name] Procedure: 2D Echo, Color Doppler and Cardiac Doppler Indications:    TIA G45.9  History:        Patient has prior history of Echocardiogram examinations, most                 recent 08/07/2020. CAD, TIA and Fatty Liver,                 Signs/Symptoms:Murmur; Risk Factors:Hypertension, Dyslipidemia                 and Non-Smoker.  Sonographer:    Aron Baba Referring Phys: [id number] OLADAPO ADEFESO  Sonographer Comments: Suboptimal subcostal window. Image acquisition challenging due to respiratory motion and Image acquisition challenging due to patient body habitus. IMPRESSIONS  1. Left ventricular ejection fraction, by estimation, is 60 to 65%. The left ventricle has normal function. The left ventricle has no regional wall motion abnormalities. Left ventricular diastolic parameters are consistent with Grade I diastolic dysfunction (impaired relaxation).  2. Right ventricular systolic function is normal. The right ventricular size is normal.  3. The mitral valve is grossly normal. No evidence of mitral valve regurgitation. No evidence of mitral stenosis.  4. The aortic valve is tricuspid. There is moderate calcification of the aortic valve. Aortic valve regurgitation is not visualized. Moderate aortic valve stenosis. Aortic valve area, by VTI measures 1.15 cm. Aortic valve mean gradient measures 22.0 mmHg. Aortic  valve Vmax measures 3.23 m/s. Comparison(s): Changes from prior study are noted. Moderate aortic valve stenosis is new. FINDINGS  Left Ventricle: Left ventricular ejection fraction, by estimation, is 60 to 65%. The left ventricle has normal function. The left ventricle has no regional wall motion abnormalities. The left ventricular internal cavity size was normal in size. There is  no left ventricular hypertrophy. Left ventricular diastolic parameters are consistent with Grade I diastolic dysfunction (impaired relaxation). Right Ventricle: The right ventricular size is normal. No increase in right ventricular wall thickness. Right ventricular systolic function is normal. Left Atrium: Left atrial size was normal in size. Right Atrium: Right atrial size was normal in size. Pericardium: There is no evidence of pericardial effusion. Mitral Valve: The mitral valve is grossly normal. No evidence of mitral valve regurgitation. No evidence of mitral valve stenosis. Tricuspid Valve: The tricuspid valve is grossly normal. Tricuspid valve regurgitation is trivial. No evidence of tricuspid stenosis. Aortic Valve: The aortic valve is tricuspid. There is moderate calcification of the aortic valve. Aortic valve regurgitation is not  visualized. Moderate aortic stenosis is present. Aortic valve mean gradient measures 22.0 mmHg. Aortic valve peak gradient  measures 41.7 mmHg. Aortic valve area, by VTI measures 1.15 cm. Pulmonic Valve: The pulmonic valve was not well visualized. Pulmonic valve regurgitation is trivial. No evidence of pulmonic stenosis. Aorta: The aortic root and ascending aorta are structurally normal, with no evidence of dilitation. Venous: The inferior vena cava was not well visualized. IAS/Shunts: The interatrial septum was not well visualized.  LEFT VENTRICLE PLAX 2D LVIDd:         4.70 cm   Diastology LVIDs:         3.10 cm   LV e' medial:    5.11 cm/s LV PW:         1.10 cm   LV E/e' medial:  14.9 LV IVS:         1.10 cm   LV e' lateral:   6.64 cm/s LVOT diam:     1.90 cm   LV E/e' lateral: 11.5 LV SV:         76 LV SV Index:   32 LVOT Area:     2.84 cm  RIGHT VENTRICLE RV S prime:     11.20 cm/s TAPSE (M-mode): 1.6 cm LEFT ATRIUM             Index        RIGHT ATRIUM           Index LA diam:        4.40 cm 1.85 cm/m   RA Area:     20.10 cm LA Vol (A2C):   76.1 ml 31.99 ml/m  RA Volume:   58.30 ml  24.51 ml/m LA Vol (A4C):   45.9 ml 19.30 ml/m LA Biplane Vol: 61.8 ml 25.98 ml/m  AORTIC VALVE AV Area (Vmax):    1.03 cm AV Area (Vmean):   1.03 cm AV Area (VTI):     1.15 cm AV Vmax:           323.00 cm/s AV Vmean:          209.667 cm/s AV VTI:            0.660 m AV Peak Grad:      41.7 mmHg AV Mean Grad:      22.0 mmHg LVOT Vmax:         117.00 cm/s LVOT Vmean:        76.200 cm/s LVOT VTI:          0.267 m LVOT/AV VTI ratio: 0.40  AORTA Ao Root diam: 3.50 cm Ao Asc diam:  3.50 cm MITRAL VALVE               TRICUSPID VALVE MV Area (PHT): 2.50 cm    TR Peak grad:   11.2 mmHg MV Decel Time: 303 msec    TR Vmax:        167.00 cm/s MR Peak grad: 3.6 mmHg MR Vmax:      95.00 cm/s   SHUNTS MV E velocity: 76.30 cm/s  Systemic VTI:  0.27 m MV A velocity: 71.60 cm/s  Systemic Diam: 1.90 cm MV E/A ratio:  1.07 Vishnu Priya Mallipeddi Electronically signed by Winfield Rast Mallipeddi Signature Date/Time: 02/11/2023/11:42:51 AM    Final    MR BRAIN WO CONTRAST  Result Date: 02/11/2023 CLINICAL DATA:  Neuro deficit, acute, stroke suspected. Facial droop. Dysarthria. EXAM: MRI HEAD WITHOUT CONTRAST MRA HEAD WITHOUT CONTRAST MRA NECK WITHOUT AND WITH CONTRAST TECHNIQUE:  Multiplanar, multi-echo pulse sequences of the brain and surrounding structures were acquired without intravenous contrast. Angiographic images of the Circle of Willis were acquired using MRA technique without intravenous contrast. Angiographic images of the neck were acquired using MRA technique without and with intravenous contrast. Carotid stenosis measurements  (when applicable) are obtained utilizing NASCET criteria, using the distal internal carotid diameter as the denominator. CONTRAST:  10mL GADAVIST GADOBUTROL 1 MMOL/ML IV SOLN COMPARISON:  Head CT done yesterday. FINDINGS: MRI HEAD FINDINGS Brain: Diffusion imaging does not show any acute or subacute infarction. There is mild generalized age related volume loss without subjective lobar predominance. No focal abnormality affects the brainstem or cerebellum. Cerebral hemispheres show mild chronic appearing small vessel change of the white matter. No cortical or large vessel territory insult. No mass lesion, hemorrhage, hydrocephalus or extra-axial collection. Vascular: Major vessels at the base of the brain show flow. Skull and upper cervical spine: Negative Sinuses/Orbits: Clear other than a few opacified left ethmoid air cells. Orbits negative. Other: None MRA HEAD FINDINGS Anterior circulation: Both internal carotid arteries are patent through the skull base and siphon regions. The anterior and middle cerebral vessels are patent. There is severe stenosis of the proximal left A1 segment but the vessel is not occluded. Azygous anterior cerebral artery pattern. Mild stenosis of the right middle cerebral artery M2 region, after takeoff of the temporal branch. Distal vessel atherosclerotic irregularity of the MCA branches diffusely. Posterior circulation: Both vertebral arteries widely patent to the basilar artery. No basilar stenosis. Posterior circulation branch vessels patent. Atherosclerotic irregularity of the more distal PCA branches. Anatomic variants: None other significant. MRA NECK FINDINGS Aortic arch: Branching pattern is normal. Right carotid system: Tortuous but widely patent to the bifurcation. Clinical atherosclerotic change at the bifurcation and proximal ICA bulb but no stenosis. Cervical ICA patent beyond that to the skull base. Left carotid system: Common carotid artery tortuous but widely patent to  the bifurcation. Mild atherosclerotic irregularity in the ICA bulb but no stenosis. Cervical ICA patent beyond that. Vertebral arteries: Vertebral artery origins are not well seen because of motion. Beyond the origins, both vertebral arteries are widely patent through the cervical region, through the foramen magnum to the basilar artery. Other: None IMPRESSION: BRAIN MRI: No acute or reversible finding. Mild age related volume loss and mild chronic small-vessel ischemic change of the cerebral hemispheric white matter. INTRACRANIAL MRA: No large vessel occlusion. Severe stenosis of the proximal left A1 segment but the vessel is not occluded. Mild stenosis of the right middle cerebral artery M2 region, after takeoff of the temporal branch. Distal vessel atherosclerotic irregularity of the MCA and PCA branches. NECK MRA: No carotid stenosis. Mild atherosclerotic irregularity of the ICA bulbs bilaterally. Vertebral artery origins not well seen because of motion. Beyond the origins, both vertebral arteries are widely patent through the cervical region, through the foramen magnum to the basilar artery. Electronically Signed   By: Paulina Fusi M.D.   On: 02/11/2023 11:25   MR ANGIO HEAD WO CONTRAST  Result Date: 02/11/2023 CLINICAL DATA:  Neuro deficit, acute, stroke suspected. Facial droop. Dysarthria. EXAM: MRI HEAD WITHOUT CONTRAST MRA HEAD WITHOUT CONTRAST MRA NECK WITHOUT AND WITH CONTRAST TECHNIQUE: Multiplanar, multi-echo pulse sequences of the brain and surrounding structures were acquired without intravenous contrast. Angiographic images of the Circle of Willis were acquired using MRA technique without intravenous contrast. Angiographic images of the neck were acquired using MRA technique without and with intravenous contrast. Carotid stenosis measurements (when applicable)  are obtained utilizing NASCET criteria, using the distal internal carotid diameter as the denominator. CONTRAST:  10mL GADAVIST GADOBUTROL  1 MMOL/ML IV SOLN COMPARISON:  Head CT done yesterday. FINDINGS: MRI HEAD FINDINGS Brain: Diffusion imaging does not show any acute or subacute infarction. There is mild generalized age related volume loss without subjective lobar predominance. No focal abnormality affects the brainstem or cerebellum. Cerebral hemispheres show mild chronic appearing small vessel change of the white matter. No cortical or large vessel territory insult. No mass lesion, hemorrhage, hydrocephalus or extra-axial collection. Vascular: Major vessels at the base of the brain show flow. Skull and upper cervical spine: Negative Sinuses/Orbits: Clear other than a few opacified left ethmoid air cells. Orbits negative. Other: None MRA HEAD FINDINGS Anterior circulation: Both internal carotid arteries are patent through the skull base and siphon regions. The anterior and middle cerebral vessels are patent. There is severe stenosis of the proximal left A1 segment but the vessel is not occluded. Azygous anterior cerebral artery pattern. Mild stenosis of the right middle cerebral artery M2 region, after takeoff of the temporal branch. Distal vessel atherosclerotic irregularity of the MCA branches diffusely. Posterior circulation: Both vertebral arteries widely patent to the basilar artery. No basilar stenosis. Posterior circulation branch vessels patent. Atherosclerotic irregularity of the more distal PCA branches. Anatomic variants: None other significant. MRA NECK FINDINGS Aortic arch: Branching pattern is normal. Right carotid system: Tortuous but widely patent to the bifurcation. Clinical atherosclerotic change at the bifurcation and proximal ICA bulb but no stenosis. Cervical ICA patent beyond that to the skull base. Left carotid system: Common carotid artery tortuous but widely patent to the bifurcation. Mild atherosclerotic irregularity in the ICA bulb but no stenosis. Cervical ICA patent beyond that. Vertebral arteries: Vertebral artery  origins are not well seen because of motion. Beyond the origins, both vertebral arteries are widely patent through the cervical region, through the foramen magnum to the basilar artery. Other: None IMPRESSION: BRAIN MRI: No acute or reversible finding. Mild age related volume loss and mild chronic small-vessel ischemic change of the cerebral hemispheric white matter. INTRACRANIAL MRA: No large vessel occlusion. Severe stenosis of the proximal left A1 segment but the vessel is not occluded. Mild stenosis of the right middle cerebral artery M2 region, after takeoff of the temporal branch. Distal vessel atherosclerotic irregularity of the MCA and PCA branches. NECK MRA: No carotid stenosis. Mild atherosclerotic irregularity of the ICA bulbs bilaterally. Vertebral artery origins not well seen because of motion. Beyond the origins, both vertebral arteries are widely patent through the cervical region, through the foramen magnum to the basilar artery. Electronically Signed   By: Paulina Fusi M.D.   On: 02/11/2023 11:25   MR ANGIO NECK W WO CONTRAST  Result Date: 02/11/2023 CLINICAL DATA:  Neuro deficit, acute, stroke suspected. Facial droop. Dysarthria. EXAM: MRI HEAD WITHOUT CONTRAST MRA HEAD WITHOUT CONTRAST MRA NECK WITHOUT AND WITH CONTRAST TECHNIQUE: Multiplanar, multi-echo pulse sequences of the brain and surrounding structures were acquired without intravenous contrast. Angiographic images of the Circle of Willis were acquired using MRA technique without intravenous contrast. Angiographic images of the neck were acquired using MRA technique without and with intravenous contrast. Carotid stenosis measurements (when applicable) are obtained utilizing NASCET criteria, using the distal internal carotid diameter as the denominator. CONTRAST:  10mL GADAVIST GADOBUTROL 1 MMOL/ML IV SOLN COMPARISON:  Head CT done yesterday. FINDINGS: MRI HEAD FINDINGS Brain: Diffusion imaging does not show any acute or subacute  infarction. There is mild generalized  age related volume loss without subjective lobar predominance. No focal abnormality affects the brainstem or cerebellum. Cerebral hemispheres show mild chronic appearing small vessel change of the white matter. No cortical or large vessel territory insult. No mass lesion, hemorrhage, hydrocephalus or extra-axial collection. Vascular: Major vessels at the base of the brain show flow. Skull and upper cervical spine: Negative Sinuses/Orbits: Clear other than a few opacified left ethmoid air cells. Orbits negative. Other: None MRA HEAD FINDINGS Anterior circulation: Both internal carotid arteries are patent through the skull base and siphon regions. The anterior and middle cerebral vessels are patent. There is severe stenosis of the proximal left A1 segment but the vessel is not occluded. Azygous anterior cerebral artery pattern. Mild stenosis of the right middle cerebral artery M2 region, after takeoff of the temporal branch. Distal vessel atherosclerotic irregularity of the MCA branches diffusely. Posterior circulation: Both vertebral arteries widely patent to the basilar artery. No basilar stenosis. Posterior circulation branch vessels patent. Atherosclerotic irregularity of the more distal PCA branches. Anatomic variants: None other significant. MRA NECK FINDINGS Aortic arch: Branching pattern is normal. Right carotid system: Tortuous but widely patent to the bifurcation. Clinical atherosclerotic change at the bifurcation and proximal ICA bulb but no stenosis. Cervical ICA patent beyond that to the skull base. Left carotid system: Common carotid artery tortuous but widely patent to the bifurcation. Mild atherosclerotic irregularity in the ICA bulb but no stenosis. Cervical ICA patent beyond that. Vertebral arteries: Vertebral artery origins are not well seen because of motion. Beyond the origins, both vertebral arteries are widely patent through the cervical region, through the  foramen magnum to the basilar artery. Other: None IMPRESSION: BRAIN MRI: No acute or reversible finding. Mild age related volume loss and mild chronic small-vessel ischemic change of the cerebral hemispheric white matter. INTRACRANIAL MRA: No large vessel occlusion. Severe stenosis of the proximal left A1 segment but the vessel is not occluded. Mild stenosis of the right middle cerebral artery M2 region, after takeoff of the temporal branch. Distal vessel atherosclerotic irregularity of the MCA and PCA branches. NECK MRA: No carotid stenosis. Mild atherosclerotic irregularity of the ICA bulbs bilaterally. Vertebral artery origins not well seen because of motion. Beyond the origins, both vertebral arteries are widely patent through the cervical region, through the foramen magnum to the basilar artery. Electronically Signed   By: Paulina Fusi M.D.   On: 02/11/2023 11:25   CT HEAD WO CONTRAST  Result Date: 02/10/2023 CLINICAL DATA:  Facial droop, dysarthria EXAM: CT HEAD WITHOUT CONTRAST TECHNIQUE: Contiguous axial images were obtained from the base of the skull through the vertex without intravenous contrast. RADIATION DOSE REDUCTION: This exam was performed according to the departmental dose-optimization program which includes automated exposure control, adjustment of the mA and/or kV according to patient size and/or use of iterative reconstruction technique. COMPARISON:  None Available. FINDINGS: Brain: No evidence of acute infarction, hemorrhage, hydrocephalus, extra-axial collection or mass lesion/mass effect. Vascular: No hyperdense vessel or unexpected calcification. Skull: Normal. Negative for fracture or focal lesion. Sinuses/Orbits: No acute finding. Other: Mastoid air cells and middle ear cavities are clear IMPRESSION: 1. No acute intracranial abnormality. Electronically Signed   By: Helyn Numbers M.D.   On: 02/10/2023 20:34    Today   Subjective    Samuel Chambers today has no new concerns No  chest pains no palpitations -No further neurodeficits       -Wife at bedside, questions answered   Patient has been seen and examined prior  to discharge   Objective   Blood pressure (!) 170/88, pulse 68, temperature 98 F (36.7 C), temperature source Oral, resp. rate 18, height 6' (1.829 m), weight 117.6 kg, SpO2 94%.   Intake/Output Summary (Last 24 hours) at 02/11/2023 1349 Last data filed at 02/11/2023 0900 Gross per 24 hour  Intake 240 ml  Output --  Net 240 ml   Exam Gen:- Awake Alert, no acute distress  HEENT:- .AT, No sclera icterus Neck-Supple Neck,No JVD,.  Lungs-  CTAB , good air movement bilaterally CV- S1, S2 normal, regular Abd-  +ve B.Sounds, Abd Soft, No tenderness,    Extremity/Skin:- No  edema,   good pulses Psych-affect is appropriate, oriented x3 Neuro-no new focal deficits, no tremors    Data Review   CBC w Diff:  Lab Results  Component Value Date   WBC 9.6 02/11/2023   HGB 13.2 02/11/2023   HGB 15.0 12/23/2019   HCT 38.4 (L) 02/11/2023   HCT 45.1 12/23/2019   PLT 205 02/11/2023   PLT 244 12/23/2019   LYMPHOPCT 28 02/10/2023   MONOPCT 9 02/10/2023   EOSPCT 5 02/10/2023   BASOPCT 1 02/10/2023   CMP:  Lab Results  Component Value Date   NA 138 02/11/2023   NA 138 06/20/2021   K 3.7 02/11/2023   CL 103 02/11/2023   CO2 24 02/11/2023   BUN 14 02/11/2023   BUN 24 06/20/2021   CREATININE 0.95 02/11/2023   CREATININE 0.89 09/21/2015   PROT 6.6 02/11/2023   PROT 6.5 06/20/2021   ALBUMIN 3.8 02/11/2023   ALBUMIN 4.2 06/20/2021   BILITOT 0.6 02/11/2023   BILITOT 0.5 06/20/2021   ALKPHOS 45 02/11/2023   AST 27 02/11/2023   ALT 31 02/11/2023  .  Total Discharge time is about 33 minutes  Shon Hale M.D on 02/11/2023 at 1:49 PM  Go to [email or web address] -  for contact info  [facility] - Office  [phone]

## 2023-02-11 NOTE — Discharge Instructions (Signed)
1)Stop Meloxicam/Mobic--- 2)Avoid ibuprofen/Advil/Aleve/Motrin/Goody Powders/Naproxen/BC powders/Meloxicam/Diclofenac/Indomethacin and other Nonsteroidal anti-inflammatory medications as these will make you more likely to bleed and can cause stomach ulcers, can also cause Kidney problems.  3)Please take Aspirin 81 mg daily along with Plavix 75 mg daily for 21 days then after that STOP the Aspirin  and continue ONLY Plavix 75 mg daily indefinitely--for secondary stroke Prevention  4)Check your Blood Pressure about every other day and keep a Blood Pressure Diary/record 5)Low Salt Diet ---Less than 2 gm per day advised 6)Follow up with Neurologist Dr. Janalyn Shy [facility] [address], [place], [place] [address], Phone-[phone]) please talk to your cardiologist about Repatha--- for cholesterol

## 2023-02-11 NOTE — Progress Notes (Signed)
Pt in MRI.

## 2023-02-11 NOTE — Evaluation (Addendum)
Physical Therapy Evaluation Patient Details Name: Samuel Chambers MRN: [id number] DOB: [birth year] Today's Date: 02/11/2023  History of Present Illness  Samuel Chambers is a 72 y.o. male with medical history significant of hypertension, CAD s/p stent placement who presents to the emergency department via EMS due to a transient slurred speech.  Patient was last well-known at 66.  History was obtained from ED physician and ED medical record.  Per report, patient was laid in a Bible study and was standing in front of the Bible study group when wife noted that he sounded thick tongued and was slurring his words, on closer look at patient, he was noted to have right facial droop, due to his smile appears symmetric.  Patient had no weakness of extremities or difficulty in being able to ambulate, did he report any vision changes or any numbness or tingling of extremities.  EMS was activated and by the time EMS team arrived, symptoms already resolved.  Symptoms was reported to have lasted about 5 to 10 minutes.  Blood glucose on arrival of EMS team was 126.   Clinical Impression  Pt tolerated transferring and ambulation in room/hallway very well with no assist from therapist or AD. Pt demonstrated no loss of balance while ambulating.  Patient discharged to care of nursing for ambulation daily as tolerated for length of stay.       Assistance Recommended at Discharge None  If plan is discharge home, recommend the following:  Can travel by private vehicle  Other (comment) (Pt can return home to full independent care)        Equipment Recommendations    Recommendations for Other Services       Functional Status Assessment Patient has had a recent decline in their functional status and demonstrates the ability to make significant improvements in function in a reasonable and predictable amount of time.     Precautions / Restrictions Precautions Precautions: None Restrictions Weight Bearing  Restrictions: No      Mobility  Bed Mobility Overal bed mobility: Independent                  Transfers Overall transfer level: Independent Equipment used: None                    Ambulation/Gait Ambulation/Gait assistance: Independent Gait Distance (Feet): 200 Feet Assistive device: None Gait Pattern/deviations: WFL(Within Functional Limits) Gait velocity: pace WFL     General Gait Details: ambulated without loss of balance or use of AD  Stairs Stairs: Yes Stairs assistance: Independent Stair Management: One rail Right, One rail Left Number of Stairs: 8 General stair comments: Able to ascend and descend stairs [facility]  Wheelchair Mobility     Tilt Bed    Modified Rankin (Stroke Patients Only)       Balance Overall balance assessment: Independent                                           Pertinent Vitals/Pain Pain Assessment Pain Assessment: No/denies pain    Home Living Family/patient expects to be discharged to:: Private residence Living Arrangements: Spouse/significant other Available Help at Discharge: Family Type of Home: House Home Access: Stairs to enter Entrance Stairs-Rails: Can reach both Entrance Stairs-Number of Steps: 4   Home Layout: One level Home Equipment: Cane - single Librarian, academic (2 wheels);Wheelchair - manual  Prior Function Prior Level of Function : Independent/Modified Independent             Mobility Comments: Community ambulator without AD; drives ADLs Comments: Independent     Hand Dominance        Extremity/Trunk Assessment                Communication   Communication: No difficulties  Cognition Arousal/Alertness: Awake/alert Behavior During Therapy: WFL for tasks assessed/performed Overall Cognitive Status: Within Functional Limits for tasks assessed                                          General Comments      Exercises      Assessment/Plan    PT Assessment Patient does not need any further PT services  PT Problem List         PT Treatment Interventions      PT Goals (Current goals can be found in the Care Plan section)  Acute Rehab PT Goals Patient Stated Goal: Return home PT Goal Formulation: With patient Time For Goal Achievement: 02/11/23 Potential to Achieve Goals: Good    Frequency       Co-evaluation PT/OT/SLP Co-Evaluation/Treatment: Yes Reason for Co-Treatment: To address functional/ADL transfers   OT goals addressed during session: ADL's and self-care       AM-PAC PT "6 Clicks" Mobility  Outcome Measure Help needed turning from your back to your side while in a flat bed without using bedrails?: None Help needed moving from lying on your back to sitting on the side of a flat bed without using bedrails?: None Help needed moving to and from a bed to a chair (including a wheelchair)?: None Help needed standing up from a chair using your arms (e.g., wheelchair or bedside chair)?: None Help needed to walk in hospital room?: None Help needed climbing 3-5 steps with a railing? : None 6 Click Score: 24    End of Session   Activity Tolerance: Patient tolerated treatment well Patient left: in chair;with call bell/phone within reach;with family/visitor present Nurse Communication: Mobility status PT Visit Diagnosis: Unsteadiness on feet (R26.81);History of falling (Z91.81);Other abnormalities of gait and mobility (R26.89);Muscle weakness (generalized) (M62.81)    Time: 1610-9604 PT Time Calculation (min) (ACUTE ONLY): 20 min   Charges:   PT Evaluation $PT Eval Moderate Complexity: 1 Mod PT Treatments $Therapeutic Activity: 8-22 mins PT General Charges $$ ACUTE PT VISIT: 1 Visit        Aren Cherne SPT [place], DPT Program

## 2023-02-11 NOTE — Progress Notes (Signed)
   02/11/23 1038  TOC Brief Assessment  Insurance and Status Reviewed  Patient has primary care physician Yes  Home environment has been reviewed with spouse  Prior level of function: independent  Prior/Current Home Services No current home services  Social Determinants of Health Reivew SDOH reviewed no interventions necessary  Readmission risk has been reviewed Yes  Transition of care needs no transition of care needs at this time   Transition of Care Department [facility]) has reviewed patient and no TOC needs have been identified at this time. We will continue to monitor patient advancement through interdisciplinary progression rounds. If new patient transition needs arise, please place a TOC consult.

## 2023-02-11 NOTE — Evaluation (Signed)
Occupational Therapy Evaluation Patient Details Name: Samuel Chambers MRN: [id number] DOB: Apr 25, 1951 Today's Date: 02/11/2023   History of Present Illness Lofton Leon is a 72 y.o. male with medical history significant of hypertension, CAD s/p stent placement who presents to the emergency department via EMS due to a transient slurred speech.  Patient was last well-known at 9.  History was obtained from ED physician and ED medical record.  Per report, patient was laid in a Bible study and was standing in front of the Bible study group when wife noted that he sounded thick tongued and was slurring his words, on closer look at patient, he was noted to have right facial droop, due to his smile appears symmetric.  Patient had no weakness of extremities or difficulty in being able to ambulate, did he report any vision changes or any numbness or tingling of extremities.  EMS was activated and by the time EMS team arrived, symptoms already resolved.  Symptoms was reported to have lasted about 5 to 10 minutes.  Blood glucose on arrival of EMS team was 126. (per DO)   Clinical Impression   Pt agreeable to OT and PT co-evaluation. Pt is at or near baseline function. Pt able to ambulate independently with good B UE functional use. Pt used bathroom without assist and completed bed mobility independently. Pt is not recommended for further acute OT services and will be discharged to care of nursing staff for remaining length of stay.       Recommendations for follow up therapy are one component of a multi-disciplinary discharge planning process, led by the attending physician.  Recommendations may be updated based on patient status, additional functional criteria and insurance authorization.   Assistance Recommended at Discharge None        Functional Status Assessment  Patient has not had a recent decline in their functional status  Equipment Recommendations  None recommended by OT            Precautions / Restrictions Precautions Precautions: None Restrictions Weight Bearing Restrictions: No      Mobility Bed Mobility Overal bed mobility: Independent                  Transfers Overall transfer level: Independent                        Balance Overall balance assessment: Independent                                         ADL either performed or assessed with clinical judgement   ADL Overall ADL's : Independent                                             Vision Baseline Vision/History:  (history of cataracts surgery, lense implants, and vitrectomy L eye with plans for R eye) Ability to See in Adequate Light: 0 Adequate Patient Visual Report: No change from baseline Vision Assessment?: No apparent visual deficits                Pertinent Vitals/Pain Pain Assessment Pain Assessment: No/denies pain     Hand Dominance Right   Extremity/Trunk Assessment Upper Extremity Assessment Upper Extremity Assessment: Overall WFL for tasks assessed (R hand  limited by trigger finger opration recently.)   Lower Extremity Assessment Lower Extremity Assessment: Defer to PT evaluation   Cervical / Trunk Assessment Cervical / Trunk Assessment: Normal   Communication Communication Communication: No difficulties   Cognition Arousal/Alertness: Awake/alert Behavior During Therapy: WFL for tasks assessed/performed Overall Cognitive Status: Within Functional Limits for tasks assessed                                                        Home Living Family/patient expects to be discharged to:: Private residence Living Arrangements: Spouse/significant other Available Help at Discharge: Family Type of Home: House Home Access: Stairs to enter Secretary/administrator of Steps: 4 Entrance Stairs-Rails: Can reach both Home Layout: One level     Bathroom Shower/Tub: Scientist, research (life sciences): Handicapped height Bathroom Accessibility: Yes How Accessible: Accessible via walker;Accessible via wheelchair Home Equipment: Cane - single Librarian, academic (2 wheels);Wheelchair - manual          Prior Functioning/Environment Prior Level of Function : Independent/Modified Independent             Mobility Comments: Tourist information centre manager without AD; drives ADLs Comments: Independent                                Co-evaluation PT/OT/SLP Co-Evaluation/Treatment: Yes Reason for Co-Treatment: To address functional/ADL transfers   OT goals addressed during session: ADL's and self-care      AM-PAC OT "6 Clicks" Daily Activity     Outcome Measure Help from another person eating meals?: None Help from another person taking care of personal grooming?: None Help from another person toileting, which includes using toliet, bedpan, or urinal?: None Help from another person bathing (including washing, rinsing, drying)?: None Help from another person to put on and taking off regular upper body clothing?: None Help from another person to put on and taking off regular lower body clothing?: None 6 Click Score: 24   End of Session    Activity Tolerance: Other (comment) (in bathroom) Patient left:    OT Visit Diagnosis: Other symptoms and signs involving the nervous system (W09.811)                Time: 9147-8295 OT Time Calculation (min): 11 min Charges:  OT General Charges $OT Visit: 1 Visit OT Evaluation $OT Eval Low Complexity: 1 Low  Keeshia Sanderlin OT, MOT  Danie Chandler 02/11/2023, 9:27 AM

## 2023-02-12 ENCOUNTER — Other Ambulatory Visit: Payer: Self-pay | Admitting: Emergency Medicine

## 2023-02-12 ENCOUNTER — Telehealth: Payer: Self-pay

## 2023-02-12 DIAGNOSIS — F411 Generalized anxiety disorder: Secondary | ICD-10-CM

## 2023-02-12 NOTE — Transitions of Care (Post Inpatient/ED Visit) (Signed)
   02/12/2023  Name: Samuel Chambers MRN: [id number] DOB: 1951-05-08  Today's TOC FU Call Status: Today's TOC FU Call Status:: Unsuccessul Call (1st Attempt) Unsuccessful Call (1st Attempt) Date: 02/12/23  Attempted to reach the patient regarding the most recent Inpatient/ED visit.  Follow Up Plan: Additional outreach attempts will be made to reach the patient to complete the Transitions of Care (Post Inpatient/ED visit) call.   Signature   Woodfin Ganja LPN [facility] Nurse Health Advisor Direct Dial [phone]

## 2023-02-13 ENCOUNTER — Ambulatory Visit: Payer: Medicare Other | Admitting: Emergency Medicine

## 2023-02-13 NOTE — Transitions of Care (Post Inpatient/ED Visit) (Signed)
   02/13/2023  Name: Samuel Chambers MRN: [id number] DOB: 08-22-1950  Today's TOC FU Call Status: Today's TOC FU Call Status:: Unsuccessful Call (2nd Attempt) Unsuccessful Call (1st Attempt) Date: 02/12/23 Unsuccessful Call (2nd Attempt) Date: 02/13/23  Attempted to reach the patient regarding the most recent Inpatient/ED visit.  Follow Up Plan: Additional outreach attempts will be made to reach the patient to complete the Transitions of Care (Post Inpatient/ED visit) call.   Signature   Woodfin Ganja LPN [facility] Nurse Health Advisor Direct Dial [phone]

## 2023-02-13 NOTE — Transitions of Care (Post Inpatient/ED Visit) (Signed)
   02/13/2023  Name: Samuel Chambers MRN: [id number] DOB: 13-Apr-1951  Today's TOC FU Call Status: Today's TOC FU Call Status:: Successful TOC FU Call Competed Unsuccessful Call (1st Attempt) Date: 02/12/23 Unsuccessful Call (2nd Attempt) Date: 02/13/23 [facility] FU Call Complete Date: 02/13/23  Transition Care Management Follow-up Telephone Call Date of Discharge: 02/11/23 [facility] Type of Discharge: Inpatient Admission Primary Inpatient Discharge Diagnosis:: TIA How have you been since you were released from the hospital?: Better Any questions or concerns?: Yes Patient Questions/Concerns:: (S) pt states that when he called the office trying to return my calls staff was very rude to him numerous times Patient Questions/Concerns Addressed: (S) Notified Provider of Patient Questions/Concerns  Items Reviewed: Did you receive and understand the discharge instructions provided?: Yes Medications obtained,verified, and reconciled?: Yes (Medications Reviewed) Any new allergies since your discharge?: No Dietary orders reviewed?: Yes Do you have support at home?: Yes  Medications Reviewed Today: Medications Reviewed Today     Reviewed by Merleen Nicely, LPN (Licensed Practical Nurse) on 02/13/23 at 1038  Med List Status: <None>   Medication Order Taking? Sig Documenting Provider Last Dose Status Informant  aspirin EC 81 MG tablet [id number] Yes Take 1 tablet (81 mg total) by mouth daily with breakfast. Swallow whole. Shon Hale, MD Taking Active   clopidogrel (PLAVIX) 75 MG tablet [id number] Yes Take 1 tablet (75 mg total) by mouth daily. Shon Hale, MD Taking Active   DULoxetine (CYMBALTA) 60 MG capsule [id number] Yes TAKE 1 CAPSULE BY MOUTH EVERY DAY Sagardia, Eilleen Kempf, MD Taking Active   losartan (COZAAR) 100 MG tablet [id number] Yes Take 1 tablet (100 mg total) by mouth daily. Shon Hale, MD Taking Active   metoprolol succinate (TOPROL-XL) 50  MG 24 hr tablet [id number] Yes Take 1 tablet (50 mg total) by mouth daily. Shon Hale, MD Taking Active             Home Care and Equipment/Supplies: Were Home Health Services Ordered?: No Any new equipment or medical supplies ordered?: No  Functional Questionnaire: Do you need assistance with bathing/showering or dressing?: No Do you need assistance with meal preparation?: No Do you need assistance with eating?: No Do you have difficulty maintaining continence: No Do you need assistance with getting out of bed/getting out of a chair/moving?: No Do you have difficulty managing or taking your medications?: No  Follow up appointments reviewed: PCP Follow-up appointment confirmed?: Yes Date of PCP follow-up appointment?: 02/17/23 Follow-up Provider: Dr Alvy Bimler [facility] Follow-up appointment confirmed?: Yes Date of Specialist follow-up appointment?: 02/26/23 Follow-Up Specialty Provider:: cardiologist Do you need transportation to your follow-up appointment?: No Do you understand care options if your condition(s) worsen?: Yes-patient verbalized understanding    SIGNATURE  Woodfin Ganja LPN [facility] Nurse Health Advisor Direct Dial [phone]

## 2023-02-17 ENCOUNTER — Ambulatory Visit (INDEPENDENT_AMBULATORY_CARE_PROVIDER_SITE_OTHER): Payer: Medicare Other | Admitting: Emergency Medicine

## 2023-02-17 ENCOUNTER — Encounter: Payer: Self-pay | Admitting: Emergency Medicine

## 2023-02-17 ENCOUNTER — Ambulatory Visit: Payer: Medicare Other | Admitting: Student

## 2023-02-17 VITALS — BP 148/84 | HR 65 | Temp 98.3°F | Ht 72.0 in | Wt 259.1 lb

## 2023-02-17 DIAGNOSIS — I10 Essential (primary) hypertension: Secondary | ICD-10-CM | POA: Diagnosis not present

## 2023-02-17 DIAGNOSIS — G459 Transient cerebral ischemic attack, unspecified: Secondary | ICD-10-CM

## 2023-02-17 DIAGNOSIS — K219 Gastro-esophageal reflux disease without esophagitis: Secondary | ICD-10-CM

## 2023-02-17 DIAGNOSIS — R7303 Prediabetes: Secondary | ICD-10-CM

## 2023-02-17 DIAGNOSIS — Z6835 Body mass index (BMI) 35.0-35.9, adult: Secondary | ICD-10-CM

## 2023-02-17 DIAGNOSIS — M159 Polyosteoarthritis, unspecified: Secondary | ICD-10-CM

## 2023-02-17 DIAGNOSIS — M150 Primary generalized (osteo)arthritis: Secondary | ICD-10-CM

## 2023-02-17 DIAGNOSIS — T466X5A Adverse effect of antihyperlipidemic and antiarteriosclerotic drugs, initial encounter: Secondary | ICD-10-CM

## 2023-02-17 DIAGNOSIS — G720 Drug-induced myopathy: Secondary | ICD-10-CM

## 2023-02-17 DIAGNOSIS — I2510 Atherosclerotic heart disease of native coronary artery without angina pectoris: Secondary | ICD-10-CM

## 2023-02-17 DIAGNOSIS — Z8739 Personal history of other diseases of the musculoskeletal system and connective tissue: Secondary | ICD-10-CM

## 2023-02-17 DIAGNOSIS — Z9861 Coronary angioplasty status: Secondary | ICD-10-CM

## 2023-02-17 DIAGNOSIS — Z09 Encounter for follow-up examination after completed treatment for conditions other than malignant neoplasm: Secondary | ICD-10-CM

## 2023-02-17 DIAGNOSIS — Z8673 Personal history of transient ischemic attack (TIA), and cerebral infarction without residual deficits: Secondary | ICD-10-CM | POA: Diagnosis not present

## 2023-02-17 DIAGNOSIS — E785 Hyperlipidemia, unspecified: Secondary | ICD-10-CM

## 2023-02-17 DIAGNOSIS — Z789 Other specified health status: Secondary | ICD-10-CM

## 2023-02-17 DIAGNOSIS — M791 Myalgia, unspecified site: Secondary | ICD-10-CM

## 2023-02-17 MED ORDER — AMLODIPINE BESYLATE 5 MG PO TABS: 5.0000 mg | ORAL_TABLET | Freq: Every day | ORAL | 3 refills | Status: DC

## 2023-02-17 NOTE — Assessment & Plan Note (Signed)
Stroke prevention measures discussed Importance of blood pressure control discussed Has follow-up appointment with neurologist Continue daily baby aspirin along with Plavix 75 mg daily as instructed during discharge

## 2023-02-17 NOTE — Assessment & Plan Note (Signed)
Diet and nutrition discussed. Cardiovascular risks associated with diabetes discussed 

## 2023-02-17 NOTE — Assessment & Plan Note (Addendum)
Elevated blood pressure readings in the office and at home Intolerant/allergic to hydrochlorothiazide Had a problem with valsartan in the past Recommend to continue losartan 100 mg and start amlodipine 5 mg daily.  Continue metoprolol succinate 50 mg daily Continue monitoring blood pressure readings at home daily for the next several weeks and keep a log.  Advised to contact the office if numbers persistently abnormal Cardiovascular risks associated with uncontrolled hypertension discussed Dietary approaches to stop hypertension discussed

## 2023-02-17 NOTE — Assessment & Plan Note (Signed)
Well-controlled. Pain management discussed. Advised to avoid NSAIDs Use Tylenol for pain as needed

## 2023-02-17 NOTE — Progress Notes (Signed)
Samuel Chambers 72 y.o.   Chief Complaint  Patient presents with   Hospitalization Follow-up    Patient was seen in the hospital follow up Dx TIA, patient has several concerns     HISTORY OF PRESENT ILLNESS: This is a 72 y.o. male here for hospital discharge follow-up Admitted on 02/10/2023 with TIA and discharged on 02/11/2023 Blood pressure readings at home have been elevated Pulse has been in the 60s BP Readings from Last 3 Encounters:  02/17/23 (!) 150/82  02/11/23 (!) 170/88  08/15/22 128/78   Discharge summary as follows: Samuel Chambers, is a 72 y.o. male  DOB [birth year]  MRN [id number].   Admission date:  02/10/2023  Admitting Physician  Frankey Shown, DO   Discharge Date:  02/11/2023    Primary MD  Georgina Quint, MD   Recommendations for primary care physician for things to follow:    1)Stop Meloxicam/Mobic--- 2)Avoid ibuprofen/Advil/Aleve/Motrin/Goody Powders/Naproxen/BC powders/Meloxicam/Diclofenac/Indomethacin and other Nonsteroidal anti-inflammatory medications as these will make you more likely to bleed and can cause stomach ulcers, can also cause Kidney problems.  3)Please take Aspirin 81 mg daily along with Plavix 75 mg daily for 21 days then after that STOP the Aspirin  and continue ONLY Plavix 75 mg daily indefinitely--for secondary stroke Prevention  4)Check your Blood Pressure about every other day and keep a Blood Pressure Diary/record 5)Low Salt Diet ---Less than 2 gm per day advised 6)Follow up with Neurologist Dr. Janalyn [facility] [address], [place], [place] [address], Phone-[phone]) please talk to your cardiologist about Repatha--- for cholesterol   Admission Diagnosis  TIA (transient ischemic attack) [G45.9]   Discharge Diagnosis  TIA (transient ischemic attack) [G45.9]   Principal Problem:   TIA (transient ischemic attack) Active Problems:   Essential  hypertension  HPI   Prior to Admission medications   Medication Sig Start Date End Date Taking? Authorizing Provider  aspirin EC 81 MG tablet Take 1 tablet (81 mg total) by mouth daily with breakfast. Swallow whole. 02/11/23  Yes Emokpae, Courage, MD  clopidogrel (PLAVIX) 75 MG tablet Take 1 tablet (75 mg total) by mouth daily. 02/11/23 02/11/24 Yes Emokpae, Courage, MD  DULoxetine (CYMBALTA) 60 MG capsule TAKE 1 CAPSULE BY MOUTH EVERY DAY 02/12/23  Yes Deuntae Kocsis, Eilleen Kempf, MD  losartan (COZAAR) 100 MG tablet Take 1 tablet (100 mg total) by mouth daily. 02/11/23  Yes Emokpae, Courage, MD  metoprolol succinate (TOPROL-XL) 50 MG 24 hr tablet Take 1 tablet (50 mg total) by mouth daily. 02/11/23  Yes Shon Hale, MD    Allergies  Allergen Reactions   Crestor [Rosuvastatin] Other (See Comments)    myalgias   Valsartan Other (See Comments)    Made patient feel tired and no energy   Warfarin And Related Other (See Comments)    Fast heart beat, went down hill, felt like he was dying    Patient Active Problem List   Diagnosis Date Noted   TIA (transient ischemic attack) 02/10/2023   Prediabetes 08/15/2022   Arthritis of hand 07/27/2022   Acquired trigger finger of left ring finger 07/27/2022   History of gout 02/07/2021   Primary osteoarthritis of right knee 01/24/2020   OA (osteoarthritis) of hip 03/16/2018   Severe obesity (BMI 35.0-35.9 with comorbidity) (HCC) 08/04/2014   Dyslipidemia 04/29/2013   CAD S/P percutaneous coronary angioplasty    S/P laparoscopic cholecystectomy July 2014 02/18/2013   OA (osteoarthritis) of knee 09/04/2012   Anxiety state 02/24/2007  Essential hypertension 02/24/2007   GERD 02/24/2007   Osteoarthritis of multiple joints 02/24/2007    Past Medical History:  Diagnosis Date   Adenomatous colon polyp 03/2005   Anxiety    CAD S/P percutaneous coronary angioplasty 2008   PCI to circumflex with a Promus DES 2.5 mm x 8 mm   Cataract     BILATERAL,REMOVED   Degenerative joint disease    Diabetes mellitus without complication (HCC)    diet controlled,PT.DENIES UPDATED 04/10/22   Diverticulosis    Fatty liver 2016   pt denies   Fibromyalgia    GERD (gastroesophageal reflux disease)    Heart murmur    Phreesia 07/29/2020   Hyperlipidemia    statin intolerant   Hypertension    Internal hemorrhoids    Obesity, Class II, BMI 35-39.9    Pneumonia 2016   PONV (postoperative nausea and vomiting)     Past Surgical History:  Procedure Laterality Date   CHOLECYSTECTOMY N/A 02/18/2013   Procedure: LAPAROSCOPIC CHOLECYSTECTOMY WITH INTRAOPERATIVE CHOLANGIOGRAM;  Surgeon: Valarie Merino, MD;  Location: [facility];  Service: General;  Laterality: N/A;   COLONOSCOPY     COLONOSCOPY  05/27/2022   CORONARY ANGIOPLASTY WITH STENT PLACEMENT  04/30/2007   2.5 mm Promus stent that was to 95% Circ;had 60-70% lesion to RCA   DOPPLER ECHOCARDIOGRAPHY  05/27/2002   [facility]normal EF 55-66%,   FOOT SURGERY Right    x2 fascia   HEEL SPUR SURGERY Right    HEMORRHOID SURGERY     Holter Monitor  04/07/2007   sinus tachy.;   JOINT REPLACEMENT N/A    Phreesia 07/29/2020   KNEE ARTHROSCOPY  03/27/2012   Procedure: ARTHROSCOPY KNEE;  Surgeon: Loanne Drilling, MD;  Location: [facility];  Service: Orthopedics;  Laterality: Left;   NM MYOCAR PERF WALL MOTION  12/19/2011   EXERCISED FOR 8-1/2 MINUTES RECHING 10 METABOLIC EQUIVALENTS. NO EVIDENCE  OF ISCHEMIA OR INFARCTION . EF 71%   POLYPECTOMY     renal dopplers  04/08/2007   relatively normal   TOTAL HIP ARTHROPLASTY Right 03/16/2018   Procedure: RIGHT TOTAL HIP ARTHROPLASTY ANTERIOR APPROACH;  Surgeon: Ollen Gross, MD;  Location: [facility];  Service: Orthopedics;  Laterality: Right;   TOTAL HIP ARTHROPLASTY Left about 10 to 12 years ago   TOTAL KNEE ARTHROPLASTY Left 09/04/2012   Procedure: TOTAL KNEE ARTHROPLASTY;  Surgeon: Loanne Drilling, MD;  Location: [facility];  Service: Orthopedics;   Laterality: Left;   TOTAL KNEE ARTHROPLASTY Right 01/24/2020   Procedure: TOTAL KNEE ARTHROPLASTY;  Surgeon: Ollen Gross, MD;  Location: [facility];  Service: Orthopedics;  Laterality: Right;     Social History   Socioeconomic History   Marital status: Married    Spouse name: Not on file   Number of children: 1   Years of education: Not on file   Highest education level: 12th grade  Occupational History    Employer: [employer]  Tobacco Use   Smoking status: Never    Passive exposure: Past (PARENTS SMOKED)   Smokeless tobacco: Never  Vaping Use   Vaping status: Never Used  Substance and Sexual Activity   Alcohol use: No   Drug use: No   Sexual activity: Not on file  Other Topics Concern   Not on file  Social History Narrative   Very little 0-2 drinks a week   Social Determinants of Health   Financial Resource Strain: Low Risk  (02/13/2023)   Overall Financial Resource  Strain (CARDIA)    Difficulty of Paying Living Expenses: Not very hard  Food Insecurity: No Food Insecurity (02/13/2023)   Hunger Vital Sign    Worried About Running Out of Food in the Last Year: Never true    Ran Out of Food in the Last Year: Never true  Transportation Needs: No Transportation Needs (02/13/2023)   PRAPARE - Administrator, Civil Service (Medical): No    Lack of Transportation (Non-Medical): No  Physical Activity: Inactive (02/13/2023)   Exercise Vital Sign    Days of Exercise per Week: 1 day    Minutes of Exercise per Session: 0 min  Stress: No Stress Concern Present (02/13/2023)   [employer] of Occupational Health - Occupational Stress Questionnaire    Feeling of Stress : Only a little  Social Connections: Socially Integrated (02/13/2023)   Social Connection and Isolation Panel [NHANES]    Frequency of Communication with Friends and Family: More than three times a week    Frequency of Social Gatherings with Friends and Family: More than three times a week     Attends Religious Services: More than 4 times per year    Active Member of [employer] or Organizations: Yes    Attends Engineer, structural: More than 4 times per year    Marital Status: Married  Catering manager Violence: Not At Risk (02/11/2023)   Humiliation, Afraid, Rape, and Kick questionnaire    Fear of Current or Ex-Partner: No    Emotionally Abused: No    Physically Abused: No    Sexually Abused: No    Family History  Problem Relation Age of Onset   Breast cancer Mother    Diabetes Mother    Prostate cancer Father    Colon cancer Neg Hx    Colon polyps Neg Hx    Esophageal cancer Neg Hx    Rectal cancer Neg Hx    Stomach cancer Neg Hx    Ulcerative colitis Neg Hx    Crohn's disease Neg Hx      Review of Systems  Constitutional: Negative.  Negative for chills and fever.  HENT: Negative.  Negative for congestion and sore throat.   Respiratory: Negative.  Negative for cough and shortness of breath.   Cardiovascular: Negative.  Negative for chest pain and palpitations.  Gastrointestinal:  Negative for abdominal pain, diarrhea, nausea and vomiting.  Genitourinary: Negative.  Negative for dysuria and hematuria.  Skin: Negative.  Negative for rash.  Neurological: Negative.  Negative for dizziness and headaches.  All other systems reviewed and are negative.   Vitals:   02/17/23 0940  BP: (!) 150/82  Pulse: 65  Temp: 98.3 F (36.8 C)  SpO2: 97%    Physical Exam Vitals reviewed.  Constitutional:      Appearance: Normal appearance. He is obese.  HENT:     Head: Normocephalic.     Mouth/Throat:     Mouth: Mucous membranes are moist.     Pharynx: Oropharynx is clear.  Eyes:     Extraocular Movements: Extraocular movements intact.     Pupils: Pupils are equal, round, and reactive to light.  Cardiovascular:     Rate and Rhythm: Normal rate and regular rhythm.     Pulses: Normal pulses.     Heart sounds: Normal heart sounds.  Pulmonary:     Effort:  Pulmonary effort is normal.     Breath sounds: Normal breath sounds.  Musculoskeletal:     Cervical back: No tenderness.  Lymphadenopathy:     Cervical: No cervical adenopathy.  Skin:    General: Skin is warm and dry.     Capillary Refill: Capillary refill takes less than 2 seconds.  Neurological:     General: No focal deficit present.     Mental Status: He is alert and oriented to person, place, and time.  Psychiatric:        Mood and Affect: Mood normal.        Behavior: Behavior normal.      ASSESSMENT & PLAN: A total of 47 minutes was spent with the patient and counseling/coordination of care regarding preparing for this visit, review of most recent office visit notes, review of hospital discharge summary, review of multiple chronic medical conditions under management, cardiovascular risks associated with uncontrolled hypertension, review of all medications and changes made, review of most recent blood work results, education on nutrition, prognosis, documentation, and need for follow-up.  Problem List Items Addressed This Visit       Cardiovascular and Mediastinum   Essential hypertension - Primary (Chronic)    Elevated blood pressure readings in the office and at home Intolerant/allergic to hydrochlorothiazide Had a problem with valsartan in the past Recommend to continue losartan 100 mg and start amlodipine 5 mg daily.  Continue metoprolol succinate 50 mg daily Continue monitoring blood pressure readings at home daily for the next several weeks and keep a log.  Advised to contact the office if numbers persistently abnormal Cardiovascular risks associated with uncontrolled hypertension discussed Dietary approaches to stop hypertension discussed      Relevant Medications   amLODipine (NORVASC) 5 MG tablet   CAD S/P percutaneous coronary angioplasty (Chronic)    Continue Plavix and aspirin Continue beta-blocker with metoprolol succinate 50 mg daily      Relevant  Medications   amLODipine (NORVASC) 5 MG tablet   TIA (transient ischemic attack)    Stroke prevention measures discussed Importance of blood pressure control discussed Has follow-up appointment with neurologist Continue daily baby aspirin along with Plavix 75 mg daily as instructed during discharge      Relevant Medications   amLODipine (NORVASC) 5 MG tablet     Digestive   GERD (Chronic)    Well-controlled and asymptomatic        Musculoskeletal and Integument   Osteoarthritis of multiple joints    Well-controlled. Pain management discussed. Advised to avoid NSAIDs Use Tylenol for pain as needed        Other   Dyslipidemia    Intolerant to statins. May benefit from Repatha. Has follow-up appointment with cardiologist next week Will discuss options for lipid management then       Severe obesity (BMI 35.0-35.9 with comorbidity) (HCC)    Diet and nutrition discussed Advised to decrease amount of daily carbohydrate intake and daily calories and increase amount of plant-based protein in his diet May benefit from semaglutide in the near future      History of gout   Prediabetes    Diet and nutrition discussed Cardiovascular risks associated with diabetes discussed      Other Visit Diagnoses     Hospital discharge follow-up       Statin intolerance       Myalgia due to statin          Patient Instructions  Hypertension, Adult High blood pressure (hypertension) is when the force of blood pumping through the arteries is too strong. The arteries are the blood vessels that carry blood from the  heart throughout the body. Hypertension forces the heart to work harder to pump blood and may cause arteries to become narrow or stiff. Untreated or uncontrolled hypertension can lead to a heart attack, heart failure, a stroke, kidney disease, and other problems. A blood pressure reading consists of a higher number over a lower number. Ideally, your blood pressure should be  below 120/80. The first ("top") number is called the systolic pressure. It is a measure of the pressure in your arteries as your heart beats. The second ("bottom") number is called the diastolic pressure. It is a measure of the pressure in your arteries as the heart relaxes. What are the causes? The exact cause of this condition is not known. There are some conditions that result in high blood pressure. What increases the risk? Certain factors may make you more likely to develop high blood pressure. Some of these risk factors are under your control, including: Smoking. Not getting enough exercise or physical activity. Being overweight. Having too much fat, sugar, calories, or salt (sodium) in your diet. Drinking too much alcohol. Other risk factors include: Having a personal history of heart disease, diabetes, high cholesterol, or kidney disease. Stress. Having a family history of high blood pressure and high cholesterol. Having obstructive sleep apnea. Age. The risk increases with age. What are the signs or symptoms? High blood pressure may not cause symptoms. Very high blood pressure (hypertensive crisis) may cause: Headache. Fast or irregular heartbeats (palpitations). Shortness of breath. Nosebleed. Nausea and vomiting. Vision changes. Severe chest pain, dizziness, and seizures. How is this diagnosed? This condition is diagnosed by measuring your blood pressure while you are seated, with your arm resting on a flat surface, your legs uncrossed, and your feet flat on the floor. The cuff of the blood pressure monitor will be placed directly against the skin of your upper arm at the level of your heart. Blood pressure should be measured at least twice using the same arm. Certain conditions can cause a difference in blood pressure between your right and left arms. If you have a high blood pressure reading during one visit or you have normal blood pressure with other risk factors, you may be  asked to: Return on a different day to have your blood pressure checked again. Monitor your blood pressure at home for 1 week or longer. If you are diagnosed with hypertension, you may have other blood or imaging tests to help your health care provider understand your overall risk for other conditions. How is this treated? This condition is treated by making healthy lifestyle changes, such as eating healthy foods, exercising more, and reducing your alcohol intake. You may be referred for counseling on a healthy diet and physical activity. Your health care provider may prescribe medicine if lifestyle changes are not enough to get your blood pressure under control and if: Your systolic blood pressure is above 130. Your diastolic blood pressure is above 80. Your personal target blood pressure may vary depending on your medical conditions, your age, and other factors. Follow these instructions at home: Eating and drinking  Eat a diet that is high in fiber and potassium, and low in sodium, added sugar, and fat. An example of this eating plan is called the DASH diet. DASH stands for Dietary Approaches to Stop Hypertension. To eat this way: Eat plenty of fresh fruits and vegetables. Try to fill one half of your plate at each meal with fruits and vegetables. Eat whole grains, such as whole-wheat pasta, brown  rice, or whole-grain bread. Fill about one fourth of your plate with whole grains. Eat or drink low-fat dairy products, such as skim milk or low-fat yogurt. Avoid fatty cuts of meat, processed or cured meats, and poultry with skin. Fill about one fourth of your plate with lean proteins, such as fish, chicken without skin, beans, eggs, or tofu. Avoid pre-made and processed foods. These tend to be higher in sodium, added sugar, and fat. Reduce your daily sodium intake. Many people with hypertension should eat less than 1,500 mg of sodium a day. Do not drink alcohol if: Your health care provider tells  you not to drink. You are pregnant, may be pregnant, or are planning to become pregnant. If you drink alcohol: Limit how much you have to: 0-1 drink a day for women. 0-2 drinks a day for men. Know how much alcohol is in your drink. In the [place] one drink equals one 12 oz bottle of beer (355 mL), one 5 oz glass of wine (148 mL), or one 1 oz glass of hard liquor (44 mL). Lifestyle  Work with your health care provider to maintain a healthy body weight or to lose weight. Ask what an ideal weight is for you. Get at least 30 minutes of exercise that causes your heart to beat faster (aerobic exercise) most days of the week. Activities may include walking, swimming, or biking. Include exercise to strengthen your muscles (resistance exercise), such as Pilates or lifting weights, as part of your weekly exercise routine. Try to do these types of exercises for 30 minutes at least 3 days a week. Do not use any products that contain nicotine or tobacco. These products include cigarettes, chewing tobacco, and vaping devices, such as e-cigarettes. If you need help quitting, ask your health care provider. Monitor your blood pressure at home as told by your health care provider. Keep all follow-up visits. This is important. Medicines Take over-the-counter and prescription medicines only as told by your health care provider. Follow directions carefully. Blood pressure medicines must be taken as prescribed. Do not skip doses of blood pressure medicine. Doing this puts you at risk for problems and can make the medicine less effective. Ask your health care provider about side effects or reactions to medicines that you should watch for. Contact a health care provider if you: Think you are having a reaction to a medicine you are taking. Have headaches that keep coming back (recurring). Feel dizzy. Have swelling in your ankles. Have trouble with your vision. Get help right away if you: Develop a severe headache or  confusion. Have unusual weakness or numbness. Feel faint. Have severe pain in your chest or abdomen. Vomit repeatedly. Have trouble breathing. These symptoms may be an emergency. Get help right away. Call 911. Do not wait to see if the symptoms will go away. Do not drive yourself to the hospital. Summary Hypertension is when the force of blood pumping through your arteries is too strong. If this condition is not controlled, it may put you at risk for serious complications. Your personal target blood pressure may vary depending on your medical conditions, your age, and other factors. For most people, a normal blood pressure is less than 120/80. Hypertension is treated with lifestyle changes, medicines, or a combination of both. Lifestyle changes include losing weight, eating a healthy, low-sodium diet, exercising more, and limiting alcohol. This information is not intended to replace advice given to you by your health care provider. Make sure you discuss any questions  you have with your health care provider. Document Revised: 05/22/2021 Document Reviewed: 05/22/2021 Elsevier Patient Education  2024 Elsevier Inc.     Edwina Barth, MD [facility]

## 2023-02-17 NOTE — Assessment & Plan Note (Signed)
Well controlled and asymptomatic. 

## 2023-02-17 NOTE — Assessment & Plan Note (Addendum)
Diet and nutrition discussed Advised to decrease amount of daily carbohydrate intake and daily calories and increase amount of plant-based protein in his diet May benefit from semaglutide in the near future

## 2023-02-17 NOTE — Assessment & Plan Note (Signed)
Continue Plavix and aspirin Continue beta-blocker with metoprolol succinate 50 mg daily

## 2023-02-17 NOTE — Patient Instructions (Signed)
Hypertension, Adult High blood pressure (hypertension) is when the force of blood pumping through the arteries is too strong. The arteries are the blood vessels that carry blood from the heart throughout the body. Hypertension forces the heart to work harder to pump blood and may cause arteries to become narrow or stiff. Untreated or uncontrolled hypertension can lead to a heart attack, heart failure, a stroke, kidney disease, and other problems. A blood pressure reading consists of a higher number over a lower number. Ideally, your blood pressure should be below 120/80. The first ("top") number is called the systolic pressure. It is a measure of the pressure in your arteries as your heart beats. The second ("bottom") number is called the diastolic pressure. It is a measure of the pressure in your arteries as the heart relaxes. What are the causes? The exact cause of this condition is not known. There are some conditions that result in high blood pressure. What increases the risk? Certain factors may make you more likely to develop high blood pressure. Some of these risk factors are under your control, including: Smoking. Not getting enough exercise or physical activity. Being overweight. Having too much fat, sugar, calories, or salt (sodium) in your diet. Drinking too much alcohol. Other risk factors include: Having a personal history of heart disease, diabetes, high cholesterol, or kidney disease. Stress. Having a family history of high blood pressure and high cholesterol. Having obstructive sleep apnea. Age. The risk increases with age. What are the signs or symptoms? High blood pressure may not cause symptoms. Very high blood pressure (hypertensive crisis) may cause: Headache. Fast or irregular heartbeats (palpitations). Shortness of breath. Nosebleed. Nausea and vomiting. Vision changes. Severe chest pain, dizziness, and seizures. How is this diagnosed? This condition is diagnosed by  measuring your blood pressure while you are seated, with your arm resting on a flat surface, your legs uncrossed, and your feet flat on the floor. The cuff of the blood pressure monitor will be placed directly against the skin of your upper arm at the level of your heart. Blood pressure should be measured at least twice using the same arm. Certain conditions can cause a difference in blood pressure between your right and left arms. If you have a high blood pressure reading during one visit or you have normal blood pressure with other risk factors, you may be asked to: Return on a different day to have your blood pressure checked again. Monitor your blood pressure at home for 1 week or longer. If you are diagnosed with hypertension, you may have other blood or imaging tests to help your health care provider understand your overall risk for other conditions. How is this treated? This condition is treated by making healthy lifestyle changes, such as eating healthy foods, exercising more, and reducing your alcohol intake. You may be referred for counseling on a healthy diet and physical activity. Your health care provider may prescribe medicine if lifestyle changes are not enough to get your blood pressure under control and if: Your systolic blood pressure is above 130. Your diastolic blood pressure is above 80. Your personal target blood pressure may vary depending on your medical conditions, your age, and other factors. Follow these instructions at home: Eating and drinking  Eat a diet that is high in fiber and potassium, and low in sodium, added sugar, and fat. An example of this eating plan is called the DASH diet. DASH stands for Dietary Approaches to Stop Hypertension. To eat this way: Eat   plenty of fresh fruits and vegetables. Try to fill one half of your plate at each meal with fruits and vegetables. Eat whole grains, such as whole-wheat pasta, brown rice, or whole-grain bread. Fill about one  fourth of your plate with whole grains. Eat or drink low-fat dairy products, such as skim milk or low-fat yogurt. Avoid fatty cuts of meat, processed or cured meats, and poultry with skin. Fill about one fourth of your plate with lean proteins, such as fish, chicken without skin, beans, eggs, or tofu. Avoid pre-made and processed foods. These tend to be higher in sodium, added sugar, and fat. Reduce your daily sodium intake. Many people with hypertension should eat less than 1,500 mg of sodium a day. Do not drink alcohol if: Your health care provider tells you not to drink. You are pregnant, may be pregnant, or are planning to become pregnant. If you drink alcohol: Limit how much you have to: 0-1 drink a day for women. 0-2 drinks a day for men. Know how much alcohol is in your drink. In the [place] one drink equals one 12 oz bottle of beer (355 mL), one 5 oz glass of wine (148 mL), or one 1 oz glass of hard liquor (44 mL). Lifestyle  Work with your health care provider to maintain a healthy body weight or to lose weight. Ask what an ideal weight is for you. Get at least 30 minutes of exercise that causes your heart to beat faster (aerobic exercise) most days of the week. Activities may include walking, swimming, or biking. Include exercise to strengthen your muscles (resistance exercise), such as Pilates or lifting weights, as part of your weekly exercise routine. Try to do these types of exercises for 30 minutes at least 3 days a week. Do not use any products that contain nicotine or tobacco. These products include cigarettes, chewing tobacco, and vaping devices, such as e-cigarettes. If you need help quitting, ask your health care provider. Monitor your blood pressure at home as told by your health care provider. Keep all follow-up visits. This is important. Medicines Take over-the-counter and prescription medicines only as told by your health care provider. Follow directions carefully. Blood  pressure medicines must be taken as prescribed. Do not skip doses of blood pressure medicine. Doing this puts you at risk for problems and can make the medicine less effective. Ask your health care provider about side effects or reactions to medicines that you should watch for. Contact a health care provider if you: Think you are having a reaction to a medicine you are taking. Have headaches that keep coming back (recurring). Feel dizzy. Have swelling in your ankles. Have trouble with your vision. Get help right away if you: Develop a severe headache or confusion. Have unusual weakness or numbness. Feel faint. Have severe pain in your chest or abdomen. Vomit repeatedly. Have trouble breathing. These symptoms may be an emergency. Get help right away. Call 911. Do not wait to see if the symptoms will go away. Do not drive yourself to the hospital. Summary Hypertension is when the force of blood pumping through your arteries is too strong. If this condition is not controlled, it may put you at risk for serious complications. Your personal target blood pressure may vary depending on your medical conditions, your age, and other factors. For most people, a normal blood pressure is less than 120/80. Hypertension is treated with lifestyle changes, medicines, or a combination of both. Lifestyle changes include losing weight, eating a healthy,   low-sodium diet, exercising more, and limiting alcohol. This information is not intended to replace advice given to you by your health care provider. Make sure you discuss any questions you have with your health care provider. Document Revised: 05/22/2021 Document Reviewed: 05/22/2021 Elsevier Patient Education  2024 [employer]vier Inc.  

## 2023-02-17 NOTE — Assessment & Plan Note (Signed)
Intolerant to statins. May benefit from Repatha. Has follow-up appointment with cardiologist next week Will discuss options for lipid management then

## 2023-02-19 ENCOUNTER — Telehealth: Payer: Self-pay | Admitting: Cardiovascular Disease

## 2023-02-19 NOTE — Progress Notes (Unsigned)
Cardiology Office Note:  .   Date:  02/20/2023  ID:  Samuel Chambers, DOB 1951-05-04, MRN [id number] PCP: Georgina Quint, MD  [facility] Providers Cardiologist:  Thurmon Fair, MD {  History of Present Illness: .   Samuel Chambers is a 72 y.o. male with a past medical history of coronary artery disease, diet-controlled diabetes mellitus, hypercholesterolemia, essential hypertension, fibromyalgia.  Here for follow-up appointment.  When he was last seen November 2022 he was making some lifestyle changes.  He was eating healthier and exercising regularly.  He has lost 75 pounds.  Felt like he was 72 years old.  Essentially here his diabetes with a hemoglobin of 5.6, off medications.  Also modest improvement in HDL which was 44.  Unfortunately LDL remained relatively high at 117 (target less than 70).  Had been intolerant to numerous statins even when administered and intermittent dosing, due to myalgias.  Previously underwent right hip replacement and relatively recent right knee replacement June 2021 and has significant lumbar spinal stenosis.   When he was last seen, denied chest pain, dyspnea, orthopnea, paroxysmal nocturnal dyspnea, syncope, palpitations, focal neurologic defects, intermittent claudication, lower extremity edema, unexplained weight gain, cough, hemoptysis or wheezing.  Rare gout attacks, treated with meloxicam as needed.  He was having some speech issues, cotton balls in his mouth. Was working at [employer]. BP 200/109. Symptoms lasted 5 minutes. Went to [facility]. He had a CT head, MRI of the head, and MRA head and neck. All negative for stroke  Today, he tells me that he has statin intolerance.  He thinks he has fibromyalgia and statins make it weight worse.  Recent TIA-like symptoms with visit to [facility]  LDL 105 when checked last week.  We discussed several injectable options to get his LDL down in a tolerable range.  We discussed how this  would help reduce his risk for future heart attack or stroke.  He also has a slightly elevated blood pressure today.  We switched his losartan to valsartan (does say that he has an allergy to it which was fatigue).  Hopefully he will not tolerate since it was a long time ago.  If not, could consider another strong ARB.  Neuro recs: Give Aspirin 81 mg daily along with Plavix 75 mg daily for 21 days then after that STOP the Aspirin  and continue ONLY Plavix 75 mg daily indefinitely--for secondary stroke Prevention (Per The multicenter SAMMPRIS trial)   Reports no shortness of breath nor dyspnea on exertion. Reports no chest pain, pressure, or tightness. No edema, orthopnea, PND. Reports no palpitations.   ROS: Pertinent ROS in HPI  Studies Reviewed: .        Echocardiogram 02/11/2023  1. Left ventricular ejection fraction, by estimation, is 60 to 65%. The  left ventricle has normal function. The left ventricle has no regional  wall motion abnormalities. Left ventricular diastolic parameters are  consistent with Grade I diastolic  dysfunction (impaired relaxation).   2. Right ventricular systolic function is normal. The right ventricular  size is normal.   3. The mitral valve is grossly normal. No evidence of mitral valve  regurgitation. No evidence of mitral stenosis.   4. The aortic valve is tricuspid. There is moderate calcification of the  aortic valve. Aortic valve regurgitation is not visualized. Moderate  aortic valve stenosis. Aortic valve area, by VTI measures 1.15 cm. Aortic  valve mean gradient measures 22.0  mmHg. Aortic valve Vmax measures  3.23 m/s.   Comparison(s): Changes from prior study are noted. Moderate aortic valve  stenosis is new.   FINDINGS   Left Ventricle: Left ventricular ejection fraction, by estimation, is 60  to 65%. The left ventricle has normal function. The left ventricle has no  regional wall motion abnormalities. The left ventricular internal cavity   size was normal in size. There is   no left ventricular hypertrophy. Left ventricular diastolic parameters  are consistent with Grade I diastolic dysfunction (impaired relaxation).   Right Ventricle: The right ventricular size is normal. No increase in  right ventricular wall thickness. Right ventricular systolic function is  normal.   Left Atrium: Left atrial size was normal in size.   Right Atrium: Right atrial size was normal in size.   Pericardium: There is no evidence of pericardial effusion.   Mitral Valve: The mitral valve is grossly normal. No evidence of mitral  valve regurgitation. No evidence of mitral valve stenosis.   Tricuspid Valve: The tricuspid valve is grossly normal. Tricuspid valve  regurgitation is trivial. No evidence of tricuspid stenosis.   Aortic Valve: The aortic valve is tricuspid. There is moderate  calcification of the aortic valve. Aortic valve regurgitation is not  visualized. Moderate aortic stenosis is present. Aortic valve mean  gradient measures 22.0 mmHg. Aortic valve peak gradient   measures 41.7 mmHg. Aortic valve area, by VTI measures 1.15 cm.   Pulmonic Valve: The pulmonic valve was not well visualized. Pulmonic valve  regurgitation is trivial. No evidence of pulmonic stenosis.   Aorta: The aortic root and ascending aorta are structurally normal, with  no evidence of dilitation.   Venous: The inferior vena cava was not well visualized.   IAS/Shunts: The interatrial septum was not well visualized.  ECHO January 2021:  1. Left ventricular ejection fraction, by estimation, is 55 to 60%. Left  ventricular ejection fraction by 3D volume is 55 %. The left ventricle has  normal function. The left ventricle has no regional wall motion  abnormalities. Left ventricular diastolic   parameters are consistent with Grade I diastolic dysfunction (impaired  relaxation).   2. Right ventricular systolic function is normal. The right ventricular   size is normal.   3. The mitral valve is normal in structure. No evidence of mitral valve  regurgitation.   4. The aortic valve is calcified. There is mild thickening of the aortic  valve. Aortic valve regurgitation is not visualized. Mild aortic valve  sclerosis is present, with no evidence of aortic valve stenosis.   5. Aortic dilatation noted. There is mild dilatation of the aortic root,  measuring 41 mm. There is mild dilatation of the ascending aorta,  measuring 38 mm.   6. The inferior vena cava is normal in size with greater than 50%  respiratory variability, suggesting right atrial pressure of 3 mmHg.      Physical Exam:   VS:  BP 136/84   Pulse 85   Ht 6' (1.829 m)   Wt 252 lb (114.3 kg)   SpO2 94%   BMI 34.18 kg/m    Wt Readings from Last 3 Encounters:  02/20/23 252 lb (114.3 kg)  02/17/23 259 lb 2 oz (117.5 kg)  02/10/23 259 lb 3.2 oz (117.6 kg)    GEN: Well nourished, well developed in no acute distress NECK: No JVD; No carotid bruits CARDIAC: RRR, no murmurs, rubs, gallops RESPIRATORY:  Clear to auscultation without rales, wheezing or rhonchi  ABDOMEN: Soft, non-tender, non-distended EXTREMITIES:  No edema; No deformity   ASSESSMENT AND PLAN: .   1.  TIA symptoms -On Plavix and aspirin and then will continue Plavix alone indefinitely -Needs better blood pressure control, switch losartan to valsartan and may need further titration -Statin intolerant with LDL 105, refer to Pharm.D. to discuss Repatha/Praluent or Leqvio  2. Moderate AS -Needs annual echocardiograms -Asymptomatic at this time  3. CAD -No chest pain or shortness of breath -Continue current medication regimen: ASA 81 mg daily, Plavix 75 mg daily, metoprolol succinate 50 mg daily, valsartan 160 mg daily   4.  Hyperlipidemia -Refer to Pharm.D. for discussion of Repatha/Praluent  5.  HTN -Blood pressure slightly elevated today -I asked him to keep track of his blood pressure x 2 weeks now  after many medications and send me those values -Change losartan 100 mg to valsartan 160 mg daily      Dispo: He can follow-up in 3 to 4 months with myself or an APP  Signed, Sharlene Dory, PA-C

## 2023-02-19 NOTE — Telephone Encounter (Signed)
Spoke with patient regarding this mornings BP readings.  167/97 before meds   145/92 30 minutes after BP meds.  States when he went to ED it was high.  He will bring BP readings to appt tomorrow.  This needs to be hospital F/U and will cancel the appt for [facility] for Hospital F/U on the 31st since being seen tomorrow

## 2023-02-19 NOTE — Telephone Encounter (Signed)
Pt c/o BP issue: STAT if pt c/o blurred vision, one-sided weakness or slurred speech  1. What are your last 5 BP readings? 8:30  this morning it was 167//97 pulse 69, 162/100 pulse 67, 164/91   2. Are you having any other symptoms (ex. Dizziness, headache, blurred vision, passed out)? Headache, patient says he is so scared - he just had a TIA on 02-10-23  3. What is your BP issue? Blood pressure been running high, patient wanted to be seen-I made an appointment  for tomorrow with Julian Hy- please call to evaluate

## 2023-02-20 ENCOUNTER — Encounter: Payer: Self-pay | Admitting: Physician Assistant

## 2023-02-20 ENCOUNTER — Ambulatory Visit: Payer: Medicare Other | Attending: Physician Assistant | Admitting: Physician Assistant

## 2023-02-20 VITALS — BP 136/84 | HR 85 | Ht 72.0 in | Wt 252.0 lb

## 2023-02-20 DIAGNOSIS — Z8739 Personal history of other diseases of the musculoskeletal system and connective tissue: Secondary | ICD-10-CM | POA: Diagnosis present

## 2023-02-20 DIAGNOSIS — E663 Overweight: Secondary | ICD-10-CM | POA: Insufficient documentation

## 2023-02-20 DIAGNOSIS — I358 Other nonrheumatic aortic valve disorders: Secondary | ICD-10-CM | POA: Insufficient documentation

## 2023-02-20 DIAGNOSIS — I10 Essential (primary) hypertension: Secondary | ICD-10-CM | POA: Insufficient documentation

## 2023-02-20 DIAGNOSIS — I2510 Atherosclerotic heart disease of native coronary artery without angina pectoris: Secondary | ICD-10-CM | POA: Diagnosis present

## 2023-02-20 DIAGNOSIS — E785 Hyperlipidemia, unspecified: Secondary | ICD-10-CM | POA: Diagnosis present

## 2023-02-20 MED ORDER — VALSARTAN 160 MG PO TABS: 160.0000 mg | ORAL_TABLET | Freq: Every day | ORAL | 3 refills | Status: DC

## 2023-02-20 NOTE — Patient Instructions (Signed)
Medication Instructions:  Stop Losartan  Start Valsartan (DIOVAN) 160 mg daily   *If you need a refill on your cardiac medications before your next appointment, please call your pharmacy*   Lab Work: None ordered   If you have labs (blood work) drawn today and your tests are completely normal, you will receive your results only by: MyChart Message (if you have MyChart) OR A paper copy in the mail If you have any lab test that is abnormal or we need to change your treatment, we will call you to review the results.   Testing/Procedures: Your physician has referred you to the Lipid Clinic   Follow-Up: At [facility], you and your health needs are our priority.  As part of our continuing mission to provide you with exceptional heart care, we have created designated Provider Care Teams.  These Care Teams include your primary Cardiologist (physician) and Advanced Practice Providers (APPs -  Physician Assistants and Nurse Practitioners) who all work together to provide you with the care you need, when you need it.  We recommend signing up for the patient portal called "MyChart".  Sign up information is provided on this After Visit Summary.  MyChart is used to connect with patients for Virtual Visits (Telemedicine).  Patients are able to view lab/test results, encounter notes, upcoming appointments, etc.  Non-urgent messages can be sent to your provider as well.   To learn more about what you can do with MyChart, go to [email or web address].    Your next appointment:   3-4 month(s)  Provider:   Thurmon Fair, MD   or APP  Other Instructions

## 2023-02-25 ENCOUNTER — Telehealth: Payer: Self-pay | Admitting: Cardiovascular Disease

## 2023-02-25 MED ORDER — VALSARTAN 160 MG PO TABS: 320.0000 mg | ORAL_TABLET | Freq: Every day | ORAL | Status: DC

## 2023-02-25 NOTE — Telephone Encounter (Signed)
Pt c/o BP issue: STAT if pt c/o blurred vision, one-sided weakness or slurred speech  1. What are your last 5 BP readings? 143/91, 150/88, 175/85, 160/80   2. Are you having any other symptoms (ex. Dizziness, headache, blurred vision, passed out)? Just feeling weak and headaches   3. What is your BP issue? Pt's wife is worried about his BP because the pt is not sleeping and having headaches. Please advise.

## 2023-02-25 NOTE — Telephone Encounter (Signed)
Spoke with patient's wife Larita Fife (OK per DPR).  Larita Fife reports several blood pressure readings over the last 2 days:  Today ~7:30am---143/91, HR 65 7:40am---150/88, HR 64 9:39am---175/85, HR 69 10:23am---160/80. HR 64  Patient had eaten breakfast around 8-8:30am today.  02/24/23 7:40am---171/83, HR 67 9:59am---154/87, HR 66 11:45am---145/84, HR 76 11:47am---144/83, HR 79 1:54pm---136/75, HR 70 5:15pm---149/76, HR 66 7:51pm---151/79, HR 65 10:25pm---157/83, HR 69  Patient takes his medications around 7:30am each morning.  Larita Fife states patient is very anxious/scared after his ED visit with TIA symptoms. She states he is very worried about having a stroke and believes this anxiety plays a part in his BP readings. Explained that eating, stress, and checking BP readings too close together can cause elevated readings as well.  Larita Fife reports BP on previous medication, losartan, was around 130's/70's. She is concerned the valsartan is not as effective and would like to know what else they can do to better managed patient's blood pressure. Do they need to give this more time? Do they need to try a different medication?  Will forward to Tessa to review and advise.

## 2023-02-25 NOTE — Telephone Encounter (Signed)
Spoke with patient and discussed recommendation from Tessa:  Please increase valsartan to 320 mg daily and have patient keep track of heart rate and blood pressure.    Patient will take 2 tablets of the 160mg  valsartan for now. He will let us know when he needs [place] to send in the 320mg  tablets.  Patient will monitor BP/HR over the next 1-2 weeks and will call us with an update.

## 2023-02-26 ENCOUNTER — Ambulatory Visit: Payer: Medicare Other | Admitting: Physician Assistant

## 2023-03-02 ENCOUNTER — Other Ambulatory Visit: Payer: Self-pay | Admitting: Cardiovascular Disease

## 2023-03-04 ENCOUNTER — Ambulatory Visit (INDEPENDENT_AMBULATORY_CARE_PROVIDER_SITE_OTHER): Payer: Medicare Other | Admitting: Neurology

## 2023-03-04 ENCOUNTER — Encounter: Payer: Self-pay | Admitting: Neurology

## 2023-03-04 VITALS — BP 150/88 | HR 78 | Ht 72.0 in | Wt 255.0 lb

## 2023-03-04 DIAGNOSIS — G459 Transient cerebral ischemic attack, unspecified: Secondary | ICD-10-CM

## 2023-03-04 DIAGNOSIS — E66811 Obesity, class 1: Secondary | ICD-10-CM

## 2023-03-04 DIAGNOSIS — E782 Mixed hyperlipidemia: Secondary | ICD-10-CM

## 2023-03-04 DIAGNOSIS — I679 Cerebrovascular disease, unspecified: Secondary | ICD-10-CM | POA: Diagnosis not present

## 2023-03-04 DIAGNOSIS — R471 Dysarthria and anarthria: Secondary | ICD-10-CM | POA: Diagnosis not present

## 2023-03-04 DIAGNOSIS — E669 Obesity, unspecified: Secondary | ICD-10-CM

## 2023-03-04 NOTE — Patient Instructions (Signed)
I had a long d/w patient about his recent TIA, risk for recurrent stroke/TIAs, personally independently reviewed imaging studies and stroke evaluation results and answered questions.Continue Plavix 75 mg daily  for secondary stroke prevention and maintain strict control of hypertension with blood pressure goal below 130/90, diabetes with hemoglobin A1c goal below 6.5% and lipids with LDL cholesterol goal below 70 mg/dL. I also advised the patient to eat a healthy diet with plenty of whole grains, cereals, fruits and vegetables, exercise regularly and maintain ideal body weight . Continue f/w in lipid clinic for repatha or praluent injectiuons due to his statin intoleranceFollowup in the future with me in 3-4 months or call earlier if needed.  Stroke Prevention Some medical conditions and behaviors can lead to a higher chance of having a stroke. You can help prevent a stroke by eating healthy, exercising, not smoking, and managing any medical conditions you have. Stroke is a leading cause of functional impairment. Primary prevention is particularly important because a majority of strokes are first-time events. Stroke changes the lives of not only those who experience a stroke but also their family and other caregivers. How can this condition affect me? A stroke is a medical emergency and should be treated right away. A stroke can lead to brain damage and can sometimes be life-threatening. If a person gets medical treatment right away, there is a better chance of surviving and recovering from a stroke. What can increase my risk? The following medical conditions may increase your risk of a stroke: Cardiovascular disease. High blood pressure (hypertension). Diabetes. High cholesterol. Sickle cell disease. Blood clotting disorders (hypercoagulable state). Obesity. Sleep disorders (obstructive sleep apnea). Other risk factors include: Being older than age 14. Having a history of blood clots, stroke, or  mini-stroke (transient ischemic attack, TIA). Genetic factors, such as race, ethnicity, or a family history of stroke. Smoking cigarettes or using other tobacco products. Taking birth control pills, especially if you also use tobacco. Heavy use of alcohol or drugs, especially cocaine and methamphetamine. Physical inactivity. What actions can I take to prevent this? Manage your health conditions High cholesterol levels. Eating a healthy diet is important for preventing high cholesterol. If cholesterol cannot be managed through diet alone, you may need to take medicines. Take any prescribed medicines to control your cholesterol as told by your health care provider. Hypertension. To reduce your risk of stroke, try to keep your blood pressure below 130/80. Eating a healthy diet and exercising regularly are important for controlling blood pressure. If these steps are not enough to manage your blood pressure, you may need to take medicines. Take any prescribed medicines to control hypertension as told by your health care provider. Ask your health care provider if you should monitor your blood pressure at home. Have your blood pressure checked every year, even if your blood pressure is normal. Blood pressure increases with age and some medical conditions. Diabetes. Eating a healthy diet and exercising regularly are important parts of managing your blood sugar (glucose). If your blood sugar cannot be managed through diet and exercise, you may need to take medicines. Take any prescribed medicines to control your diabetes as told by your health care provider. Get evaluated for obstructive sleep apnea. Talk to your health care provider about getting a sleep evaluation if you snore a lot or have excessive sleepiness. Make sure that any other medical conditions you have, such as atrial fibrillation or atherosclerosis, are managed. Nutrition Follow instructions from your health care provider about what  to  eat or drink to help manage your health condition. These instructions may include: Reducing your daily calorie intake. Limiting how much salt (sodium) you use to 1,500 milligrams (mg) each day. Using only healthy fats for cooking, such as olive oil, canola oil, or sunflower oil. Eating healthy foods. You can do this by: Choosing foods that are high in fiber, such as whole grains, and fresh fruits and vegetables. Eating at least 5 servings of fruits and vegetables a day. Try to fill one-half of your plate with fruits and vegetables at each meal. Choosing lean protein foods, such as lean cuts of meat, poultry without skin, fish, tofu, beans, and nuts. Eating low-fat dairy products. Avoiding foods that are high in sodium. This can help lower blood pressure. Avoiding foods that have saturated fat, trans fat, and cholesterol. This can help prevent high cholesterol. Avoiding processed and prepared foods. Counting your daily carbohydrate intake.  Lifestyle If you drink alcohol: Limit how much you have to: 0-1 drink a day for women who are not pregnant. 0-2 drinks a day for men. Know how much alcohol is in your drink. In the [place] one drink equals one 12 oz bottle of beer ( ), one 5 oz glass of wine ( ), or one 1 oz glass of hard liquor (44mL). Do not use any products that contain nicotine or tobacco. These products include cigarettes, chewing tobacco, and vaping devices, such as e-cigarettes. If you need help quitting, ask your health care provider. Avoid secondhand smoke. Do not use drugs. Activity  Try to stay at a healthy weight. Get at least 30 minutes of exercise on most days, such as: Fast walking. Biking. Swimming. Medicines Take over-the-counter and prescription medicines only as told by your health care provider. Aspirin or blood thinners (antiplatelets or anticoagulants) may be recommended to reduce your risk of forming blood clots that can lead to stroke. Avoid taking  birth control pills. Talk to your health care provider about the risks of taking birth control pills if: You are over 36 years old. You smoke. You get very bad headaches. You have had a blood clot. Where to find more information American Stroke Association: www.strokeassociation.org Get help right away if: You or a loved one has any symptoms of a stroke. "BE FAST" is an easy way to remember the main warning signs of a stroke: B - Balance. Signs are dizziness, sudden trouble walking, or loss of balance. E - Eyes. Signs are trouble seeing or a sudden change in vision. F - Face. Signs are sudden weakness or numbness of the face, or the face or eyelid drooping on one side. A - Arms. Signs are weakness or numbness in an arm. This happens suddenly and usually on one side of the body. S - Speech. Signs are sudden trouble speaking, slurred speech, or trouble understanding what people say. T - Time. Time to call emergency services. Write down what time symptoms started. You or a loved one has other signs of a stroke, such as: A sudden, severe headache with no known cause. Nausea or vomiting. Seizure. These symptoms may represent a serious problem that is an emergency. Do not wait to see if the symptoms will go away. Get medical help right away. Call your local emergency services (911 in the [place] Do not drive yourself to the hospital. Summary You can help to prevent a stroke by eating healthy, exercising, not smoking, limiting alcohol intake, and managing any medical conditions you may have. Do not use any  products that contain nicotine or tobacco. These include cigarettes, chewing tobacco, and vaping devices, such as e-cigarettes. If you need help quitting, ask your health care provider. Remember "BE FAST" for warning signs of a stroke. Get help right away if you or a loved one has any of these signs. This information is not intended to replace advice given to you by your health care provider. Make  sure you discuss any questions you have with your health care provider. Document Revised: 06/17/2022 Document Reviewed: 06/17/2022 Elsevier Patient Education  2024 [employer].

## 2023-03-04 NOTE — Progress Notes (Signed)
[facility] [address] [place]. [place] [address] [phone]       OFFICE CONSULT NOTE  Samuel Chambers Date of Birth:  23-Sep-1950 Medical Record Number:  [id number]   Referring MD:  Mariea Clonts Courage  Reason for Referral:  TIA  HPI: Samuel Chambers is a 72 year old Caucasian male who is seen today for initial office consultation visit for TIA.  He is accompanied by his wife.  History is obtained from them and review of electronic medical records.  I personally reviewed pertinent available imaging films in PACS.  His past medical history significant for hypertension, coronary artery disease s/p stent, fibromyalgia, hyperlipidemia, gastroesophageal reflux disease and mild obesity.  He presented on 02/10/2023 to the emergency room when his wife noticed that his speech sounded thick with slurring of words and some right facial droop while he and his wife were reading her Bible class.  Patient symptoms lasted only few minutes and by the time EMS arrived he was already improving.  His blood glucose on arrival was found to be 126 mg percent by EMS.  Blood pressure was 154/74.  CT head showed no acute abnormality.  Teleneurology was consulted and MRI scan of the brain was obtained which showed no acute infarct.  MRA of the neck showed no large vessel stenosis or occlusion but MRI of the brain showed moderate stenosis of the left A1 and right M2 branches.  2D echo showed ejection fraction of 60 to 65%.  Urine drug screen was negative.  LDL cholesterol was elevated at 105 mg percent.  Hemoglobin A1c was 6.2.  Patient was started on aspirin and Plavix for 3 weeks followed by Plavix alone and she was already on aspirin prior to admission.  Patient states is done well since then.  He said no recurrent episodes of slurred speech, facial droop or any other TIA or stroke symptoms.  He has stopped aspirin and is now on Plavix alone tolerating well minor bruising.  He does have history of multiple  statin intolerance in the past and has been referred to rapid clinic.  Hopefully he will get started on injectable soon.  He has been eating healthy and has lost 10 pounds.  He wants to be more active and exercise.  He is not willing to undergo polysomnogram to be tested for sleep apnea at the present time. ROS:   14 system review of systems is positive for slurred speech, facial droop and all other systems negative  PMH:  Past Medical History:  Diagnosis Date   Adenomatous colon polyp 03/2005   Anxiety    CAD S/P percutaneous coronary angioplasty 2008   PCI to circumflex with a Promus DES 2.5 mm x 8 mm   Cataract    BILATERAL,REMOVED   Degenerative joint disease    Diabetes mellitus without complication (HCC)    diet controlled,PT.DENIES UPDATED 04/10/22   Diverticulosis    Fatty liver 2016   pt denies   Fibromyalgia    GERD (gastroesophageal reflux disease)    Heart murmur    Phreesia 07/29/2020   Hyperlipidemia    statin intolerant   Hypertension    Internal hemorrhoids    Obesity, Class II, BMI 35-39.9    Pneumonia 2016   PONV (postoperative nausea and vomiting)     Social History:  Social History   Socioeconomic History   Marital status: Married    Spouse name: Not on file   Number of children: 1   Years of education:  Not on file   Highest education level: 12th grade  Occupational History    Employer: [employer]  Tobacco Use   Smoking status: Never    Passive exposure: Past (PARENTS SMOKED)   Smokeless tobacco: Never  Vaping Use   Vaping status: Never Used  Substance and Sexual Activity   Alcohol use: No   Drug use: No   Sexual activity: Not on file  Other Topics Concern   Not on file  Social History Narrative   Very little 0-2 drinks a week   Social Determinants of Health   Financial Resource Strain: Low Risk  (02/13/2023)   Overall Financial Resource Strain (CARDIA)    Difficulty of Paying Living Expenses: Not very hard  Food Insecurity: No  Food Insecurity (02/13/2023)   Hunger Vital Sign    Worried About Running Out of Food in the Last Year: Never true    Ran Out of Food in the Last Year: Never true  Transportation Needs: No Transportation Needs (02/13/2023)   PRAPARE - Administrator, Civil Service (Medical): No    Lack of Transportation (Non-Medical): No  Physical Activity: Inactive (02/13/2023)   Exercise Vital Sign    Days of Exercise per Week: 1 day    Minutes of Exercise per Session: 0 min  Stress: No Stress Concern Present (02/13/2023)   [employer] of Occupational Health - Occupational Stress Questionnaire    Feeling of Stress : Only a little  Social Connections: Socially Integrated (02/13/2023)   Social Connection and Isolation Panel [NHANES]    Frequency of Communication with Friends and Family: More than three times a week    Frequency of Social Gatherings with Friends and Family: More than three times a week    Attends Religious Services: More than 4 times per year    Active Member of [employer] or Organizations: Yes    Attends Engineer, structural: More than 4 times per year    Marital Status: Married  Catering manager Violence: Not At Risk (02/11/2023)   Humiliation, Afraid, Rape, and Kick questionnaire    Fear of Current or Ex-Partner: No    Emotionally Abused: No    Physically Abused: No    Sexually Abused: No    Medications:   Current Outpatient Medications on File Prior to Visit  Medication Sig Dispense Refill   amLODipine (NORVASC) 5 MG tablet Take 1 tablet (5 mg total) by mouth daily. 90 tablet 3   clopidogrel (PLAVIX) 75 MG tablet Take 1 tablet (75 mg total) by mouth daily. 30 tablet 11   DULoxetine (CYMBALTA) 60 MG capsule TAKE 1 CAPSULE BY MOUTH EVERY DAY 90 capsule 1   losartan (COZAAR) 100 MG tablet Take 100 mg by mouth daily.     metoprolol succinate (TOPROL-XL) 50 MG 24 hr tablet Take 1 tablet (50 mg total) by mouth daily. 30 tablet 3   aspirin EC 81 MG tablet Take 1  tablet (81 mg total) by mouth daily with breakfast. Swallow whole. (Patient not taking: Reported on 03/04/2023) 30 tablet 0   valsartan (DIOVAN) 160 MG tablet Take 2 tablets (320 mg total) by mouth daily. (Patient not taking: Reported on 03/04/2023)     No current facility-administered medications on file prior to visit.    Allergies:   Allergies  Allergen Reactions   Crestor [Rosuvastatin] Other (See Comments)    myalgias   Valsartan Other (See Comments)    Made patient feel tired and no energy   Warfarin  And Related Other (See Comments)    Fast heart beat, went down hill, felt like he was dying    Physical Exam General: Mildly obese middle-aged Caucasian male seated, in no evident distress Head: head normocephalic and atraumatic.   Neck: supple with no carotid or supraclavicular bruits Cardiovascular: regular rate and rhythm, no murmurs Musculoskeletal: no deformity Skin:  no rash/petichiae Vascular:  Normal pulses all extremities  Neurologic Exam Mental Status: Awake and fully alert. Oriented to place and time. Recent and remote memory intact. Attention span, concentration and fund of knowledge appropriate. Mood and affect appropriate.  Cranial Nerves: Fundoscopic exam reveals sharp disc margins. Pupils equal, briskly reactive to light. Extraocular movements full without nystagmus. Visual fields full to confrontation. Hearing diminished bilaterally facial sensation intact. Face, tongue, palate moves normally and symmetrically.  Motor: Normal bulk and tone. Normal strength in all tested extremity muscles. Sensory.: intact to touch , pinprick , position and vibratory sensation.  Coordination: Rapid alternating movements normal in all extremities. Finger-to-nose and heel-to-shin performed accurately bilaterally. Gait and Station: Arises from chair without difficulty. Stance is normal. Gait demonstrates normal stride length and balance . Able to heel, toe and tandem walk with moderate  difficulty.  Reflexes: 1+ and symmetric. Toes downgoing.   NIHSS  0 Modified Rankin  0   ASSESSMENT: 72 year old Caucasian male with transient episode of right facial droop and slurred speech likely due to left hemispheric subcortical/brainstem TIA due to small vessel disease.  Vascular risk factors of hypertension and hyperlipidemia and obesity and borderline diabetes     PLAN:I had a long d/w patient about his recent TIA, risk for recurrent stroke/TIAs, personally independently reviewed imaging studies and stroke evaluation results and answered questions.Continue Plavix 75 mg daily  for secondary stroke prevention and maintain strict control of hypertension with blood pressure goal below 130/90, diabetes with hemoglobin A1c goal below 6.5% and lipids with LDL cholesterol goal below 70 mg/dL. I also advised the patient to eat a healthy diet with plenty of whole grains, cereals, fruits and vegetables, exercise regularly and maintain ideal body weight Continue f/w in lipid clinic for repatha or praluent injectiuons due to his statin intolerance.Followup in the future with me in 3-4 months or call earlier if needed. Greater than 50% time during this 45-minute consultation visit was spent on counseling and coordination of care about his TIA and discussion about stroke evaluation, prevention and treatment and answering questions. Delia Heady, MD Note: This document was prepared with digital dictation and possible smart phrase technology. Any transcriptional errors that result from this process are unintentional.

## 2023-03-11 ENCOUNTER — Ambulatory Visit: Payer: Medicare Other | Attending: Interventional Cardiology | Admitting: Pharmacist

## 2023-03-11 VITALS — BP 138/78 | HR 69

## 2023-03-11 DIAGNOSIS — T466X5A Adverse effect of antihyperlipidemic and antiarteriosclerotic drugs, initial encounter: Secondary | ICD-10-CM | POA: Insufficient documentation

## 2023-03-11 DIAGNOSIS — E785 Hyperlipidemia, unspecified: Secondary | ICD-10-CM | POA: Insufficient documentation

## 2023-03-11 DIAGNOSIS — I10 Essential (primary) hypertension: Secondary | ICD-10-CM | POA: Insufficient documentation

## 2023-03-11 DIAGNOSIS — G720 Drug-induced myopathy: Secondary | ICD-10-CM | POA: Diagnosis present

## 2023-03-11 MED ORDER — AMLODIPINE BESYLATE 10 MG PO TABS: 10.0000 mg | ORAL_TABLET | Freq: Every day | ORAL | 3 refills | Status: DC

## 2023-03-11 NOTE — Assessment & Plan Note (Signed)
Assessment: Blood pressure is above goal of <130/80 in clinic and at home He did not tolerate valsartan 320mg . Stopped this medication and resumed losartan 100mg  daily Is working on losing weight  Plan: Increase amlodipine to 10mg  daily Continue to work on diet and weight loss Follow up with me in 1 month Patient to check his BP daily and bring log with him

## 2023-03-11 NOTE — Progress Notes (Unsigned)
Patient ID: Samuel Chambers                 DOB: [birth year]                    MRN: [id number]      HPI: Clenard Shimomura is a 72 y.o. male patient referred to lipid clinic by Jari Favre, PA. PMH is significant for coronary artery disease, diet-controlled diabetes mellitus, hypercholesterolemia, essential hypertension, fibromyalgia and statin intolerance and recent TIA.   Patient recenlty was seen at [facility] for TIA like symptoms. His BP was very elevated. Seen by Julian Hy for follow up. Losartan changed to valsartan 320mg .   Patient presents today for follow up. Reports that valsartan 160mg  did not help his blood pressure and 320mg  made him feel tired, could not do anything. This was added to allergy list. He has stopped valsartan and resumed losartan. Blood pressure at home runs about 134-144/70-80. He recently started working on his diet. Lost 12lb. Not as active recently as before.  His recent TIA has him very concerned. Says he has been told that he is "going to have the big one any day." Assured him that just because he had a TIA does not mean he is going to have a CVA.   Per patient he has tried every statin there is. Hurts all over. Not on any cholesterol medication at this time.   Reviewed options for lowering LDL cholesterol PCSK-9 inhibitors and inclisiran.  Discussed mechanisms of action, dosing, side effects and potential decreases in LDL cholesterol.  Also reviewed cost information.   Current Medications: none Intolerances: rosuvastatin, atorvastatin (leg, back pain), ezetimibe Risk Factors: CAD, TIA, HTN LDL-C goal: <70  Diet: a lot of fruit and vegetables, no sweets Chicken, cut back on red meat Drink: flavored water  Exercise: pickleball  Family History:  Family History  Problem Relation Age of Onset   Breast cancer Mother    Diabetes Mother    Prostate cancer Father    Colon cancer Neg Hx    Colon polyps Neg Hx    Esophageal cancer Neg Hx    Rectal cancer  Neg Hx    Stomach cancer Neg Hx    Ulcerative colitis Neg Hx    Crohn's disease Neg Hx    Social History: no ETOH, no tobacco, no drugs  Labs: Lipid Panel     Component Value Date/Time   CHOL 172 02/11/2023 0420   CHOL 177 06/20/2021 0841   TRIG 173 (H) 02/11/2023 0420   HDL 32 (L) 02/11/2023 0420   HDL 44 06/20/2021 0841   CHOLHDL 5.4 02/11/2023 0420   VLDL 35 02/11/2023 0420   LDLCALC 105 (H) 02/11/2023 0420   LDLCALC 117 (H) 06/20/2021 0841   LABVLDL 16 06/20/2021 0841    Past Medical History:  Diagnosis Date   Adenomatous colon polyp 03/2005   Anxiety    CAD S/P percutaneous coronary angioplasty 2008   PCI to circumflex with a Promus DES 2.5 mm x 8 mm   Cataract    BILATERAL,REMOVED   Degenerative joint disease    Diabetes mellitus without complication (HCC)    diet controlled,PT.DENIES UPDATED 04/10/22   Diverticulosis    Fatty liver 2016   pt denies   Fibromyalgia    GERD (gastroesophageal reflux disease)    Heart murmur    Phreesia 07/29/2020   Hyperlipidemia    statin intolerant   Hypertension    Internal hemorrhoids  Obesity, Class II, BMI 35-39.9    Pneumonia 2016   PONV (postoperative nausea and vomiting)     Current Outpatient Medications on File Prior to Visit  Medication Sig Dispense Refill   clopidogrel (PLAVIX) 75 MG tablet Take 1 tablet (75 mg total) by mouth daily. 30 tablet 11   DULoxetine (CYMBALTA) 60 MG capsule TAKE 1 CAPSULE BY MOUTH EVERY DAY 90 capsule 1   losartan (COZAAR) 100 MG tablet Take 100 mg by mouth daily.     metoprolol succinate (TOPROL-XL) 50 MG 24 hr tablet Take 1 tablet (50 mg total) by mouth daily. 30 tablet 3   No current facility-administered medications on file prior to visit.    Allergies  Allergen Reactions   Crestor [Rosuvastatin] Other (See Comments)    myalgias   Valsartan Other (See Comments)    Made patient feel tired and no energy   Warfarin And Related Other (See Comments)    Fast heart beat, went  down hill, felt like he was dying    Assessment/Plan:  -  Essential hypertension Assessment: Blood pressure is above goal of <130/80 in clinic and at home He did not tolerate valsartan 320mg . Stopped this medication and resumed losartan 100mg  daily Is working on losing weight  Plan: Increase amlodipine to 10mg  daily Continue to work on diet and weight loss Follow up with me in 1 month Patient to check his BP daily and bring log with him   Dyslipidemia Assessment: LDL-C is above goal of <70 Does not tolerate statins Reviewed PCSK9i vs Leqvio Patient not willing to spend ~$50/month on PCSK9i  Plan: Submit start form for [employer] Refer to [employer] center   [employer],  Olene Floss, Pharm.D, BCACP, BCPS, CPP [facility] [address], [place], [place] [address]  Phone: [phone]; Fax: [phone]

## 2023-03-11 NOTE — Assessment & Plan Note (Signed)
Assessment: LDL-C is above goal of <70 Does not tolerate statins Reviewed PCSK9i vs Leqvio Patient not willing to spend ~$50/month on PCSK9i  Plan: Submit start form for [employer] Refer to [employer] infusion center

## 2023-03-11 NOTE — Patient Instructions (Addendum)
Your blood pressure goal is < 130/67mmHg   Please increase amlodipine to 10mg  daily Continue losartan and metoprolol Continue checking blood pressure at home. Please bring your readings with you. I will look into the coverage of Leqvio for you  Please call me at [phone] with any questions  Important lifestyle changes to control high blood pressure  Intervention  Effect on the BP   Weight loss Weight loss is one of the most effective lifestyle changes for controlling blood pressure. If you're overweight or obese, losing even a small amount of weight can help reduce blood pressure.    Blood pressure can decrease by 1 millimeter of mercury (mmHg) with each kilogram (about 2.2 pounds) of weight lost.   Exercise regularly As a general goal, aim for 30 minutes of moderate physical activity every day.    Regular physical activity can lower blood pressure by 5 - 8 mmHg.   Eat a healthy diet Eat a diet rich in whole grains, fruits, vegetables, lean meat, and low-fat dairy products. Limit processed foods, saturated fat, and sweets.    A heart-healthy diet can lower high blood pressure by 10 mmHg.   Reduce salt (sodium) in your diet Aim for 000mg  of sodium each day. Avoid deli meats, canned food, and frozen microwave meals which are high in sodium.     Limiting sodium can reduce blood pressure by 5 mmHg.   Limit alcohol One drink equals 12 ounces of beer, 5 ounces of wine, or 1.5 ounces of 80-proof liquor.    Limiting alcohol to < 1 drink a day for women or < 2 drinks a day for men can help lower blood pressure by about 4 mmHg.   To check your pressure at home you will need to:   Sit up in a chair, with feet flat on the floor and back supported. Do not cross your ankles or legs. Rest your left arm so that the cuff is about heart level. If the cuff goes on your upper arm, then just relax your arm on the table, arm of the chair, or your lap. If you have a wrist cuff, hold your  wrist against your chest at heart level. Place the cuff snugly around your arm, about 1 inch above the crease of your elbow. The cords should be inside the groove of your elbow.  Sit quietly, with the cuff in place, for about 5 minutes. Then press the power button to start a reading. Do not talk or move while the reading is taking place.  Record your readings on a sheet of paper. Although most cuffs have a memory, it is often easier to see a pattern developing when the numbers are all in front of you.  You can repeat the reading after 1-3 minutes if it is recommended.   Make sure your bladder is empty and you have not had caffeine or tobacco within the last 30 minutes   Always bring your blood pressure log with you to your appointments. If you have not brought your monitor in to be double checked for accuracy, please bring it to your next appointment.   You can find a list of validated (accurate) blood pressure cuffs at: validatebp.org

## 2023-03-20 ENCOUNTER — Other Ambulatory Visit: Payer: Self-pay | Admitting: Pharmacist

## 2023-03-20 ENCOUNTER — Telehealth: Payer: Self-pay | Admitting: Pharmacist

## 2023-03-20 NOTE — Telephone Encounter (Signed)
Per [facility] pt supplement plan has been terminated. Looks like we have an old card scanned in. Pt will send new card via mychart or email.

## 2023-03-21 ENCOUNTER — Telehealth: Payer: Self-pay | Admitting: Pharmacy Technician

## 2023-03-21 NOTE — Telephone Encounter (Signed)
Samuel Chambers, Fyi note: Patient will be scheduled as soon as possible.  Auth Submission: NO AUTH NEEDED Site of care: Site of care: [facility] Payer: MEDICARE A/B & [employer] Medication & CPT/J Code(s) submitted: Leqvio (Inclisiran) [phone] Route of submission (phone, fax, portal):  Phone # Fax # Auth type: Buy/Bill PB Units/visits requested: [id number]  [id number] DOSES Reference number:  Approval from: 03/21/23 to 07/29/23   Medicare will cover 80% and Supp will pick-up remaining 20%. Will be 100% covered.

## 2023-03-25 NOTE — Addendum Note (Signed)
Addended by: Malena Peer D on: 03/25/2023 08:42 AM   Modules accepted: Orders

## 2023-03-27 NOTE — Addendum Note (Signed)
Addended by: Malena Peer D on: 03/27/2023 10:25 AM   Modules accepted: Orders

## 2023-03-27 NOTE — Telephone Encounter (Addendum)
119/73 129/82 133/78 131/74 111/70 128/86 139/79 128/75 131/81  Patient states he does not want to take Leqvio. Read it can cause muscle aches He stopped taking amlodipine due to swelling- he had swelling all over per his report Starting taking hydrochlorothiazide and lost 8lb in 2 days. He states he feels much better. Way better than since his TIA incident. He also reports he stopped the plavix and is only taking ASA He will see me 9/13 for BP f/u and labs  Losartan 100mg  Metoprolol  50mg  Hydrochlorothiazide 25mg  ASA 81mg 

## 2023-03-27 NOTE — Addendum Note (Signed)
Addended by: Desma Mcgregor on: 03/27/2023 12:25 PM   Modules accepted: Orders

## 2023-04-11 ENCOUNTER — Ambulatory Visit: Payer: Medicare Other | Attending: Internal Medicine | Admitting: Pharmacist

## 2023-04-11 VITALS — BP 130/78 | Wt 252.0 lb

## 2023-04-11 DIAGNOSIS — I10 Essential (primary) hypertension: Secondary | ICD-10-CM | POA: Diagnosis not present

## 2023-04-11 NOTE — Assessment & Plan Note (Signed)
Assessment: Blood pressure is at goal in clinic today Home readings very however patient is not resting prior to checking and he has several still at goal Patient very sensitive to medications He is active however we did discuss increasing his physical activity He has been losing weight however he feels discouraged.  Reminded him that a healthy weight to lose weight is just a pound or 2 a week.  It is a slow process and needs to be patient Needs BMP today since resuming hydrochlorothiazide  Plan: Check BMP today Continue losartan 100 mg daily metoprolol 50 mg daily and hydrochlorothiazide 25 mg daily Follow-up as needed

## 2023-04-11 NOTE — Progress Notes (Signed)
Patient ID: [name]                 DOB: 02/26/1951                    MRN: [id number]      HPI: Samuel Chambers is a 72 y.o. male patient referred to lipid clinic by Samuel Favre, PA. PMH is significant for coronary artery disease, diet-controlled diabetes mellitus, hypercholesterolemia, essential hypertension, fibromyalgia and statin intolerance and recent TIA.   Patient recenlty was seen at [facility] for TIA like symptoms. His BP was very elevated. Seen by Samuel Chambers for follow up. Losartan changed to valsartan 320mg .   Patient seen 03/11/23 in clinic. Patient reported that valsartan 160mg  did not help his blood pressure and 320mg  made him feel tired, could not do anything. This was added to allergy list. He has stopped valsartan and resumed losartan. Blood pressure at home was running about 134-144/70-80. He had recently started working on his diet. Lost 12lb. I increased his amlodipine to 10mg . I spoke with him on 8/22 and he reported that he stopped amlodipine due to swelling. He started taking his hydrochlorothiazide again. Lost 8lb in 2 days and BP was much better and he felt way better. Adivsed ok for for him to continue and to keep f/u in clinic. He declined Leqvio or any cholesterol treatment.   Patient presents today to clinic accompanied by his wife.  He denies any dizziness or lightheadedness.  Very minor swelling.  He brings in his home blood pressure cuff and readings.  Blood pressure cuff found to be accurate.  Home blood pressure readings vary from the 110s to 130s over 70s to 80s.  However patient is not resting before checking.  He reports that he is feeling much better on his blood pressure regimen.  Home Cuff: 148/86 Home BP cuff: 134/83 Clinic: 130/78 Home BP: 129/79   Current BP Medications:  Losartan 100mg  Metoprolol  50mg  Hydrochlorothiazide 25mg  Intolerances: amlodipine 10mg  (swelling)   Home BP   Diet: a lot of fruit and vegetables, no sweets Chicken,  cut back on red meat Drink: flavored water  Exercise: pickleball  Family History:  Family History  Problem Relation Age of Onset   Breast cancer Mother    Diabetes Mother    Prostate cancer Father    Colon cancer Neg Hx    Colon polyps Neg Hx    Esophageal cancer Neg Hx    Rectal cancer Neg Hx    Stomach cancer Neg Hx    Ulcerative colitis Neg Hx    Crohn's disease Neg Hx    Social History: no ETOH, no tobacco, no drugs  Labs: Lipid Panel     Component Value Date/Time   CHOL 172 02/11/2023 0420   CHOL 177 06/20/2021 0841   TRIG 173 (H) 02/11/2023 0420   HDL 32 (L) 02/11/2023 0420   HDL 44 06/20/2021 0841   CHOLHDL 5.4 02/11/2023 0420   VLDL 35 02/11/2023 0420   LDLCALC 105 (H) 02/11/2023 0420   LDLCALC 117 (H) 06/20/2021 0841   LABVLDL 16 06/20/2021 0841    Past Medical History:  Diagnosis Date   Adenomatous colon polyp 03/2005   Anxiety    CAD S/P percutaneous coronary angioplasty 2008   PCI to circumflex with a Promus DES 2.5 mm x 8 mm   Cataract    BILATERAL,REMOVED   Degenerative joint disease    Diabetes mellitus without complication (HCC)  diet controlled,PT.DENIES UPDATED 04/10/22   Diverticulosis    Fatty liver 2016   pt denies   Fibromyalgia    GERD (gastroesophageal reflux disease)    Heart murmur    Phreesia 07/29/2020   Hyperlipidemia    statin intolerant   Hypertension    Internal hemorrhoids    Obesity, Class II, BMI 35-39.9    Pneumonia 2016   PONV (postoperative nausea and vomiting)     Current Outpatient Medications on File Prior to Visit  Medication Sig Dispense Refill   aspirin EC 81 MG tablet Take 81 mg by mouth daily. Swallow whole.     DULoxetine (CYMBALTA) 60 MG capsule TAKE 1 CAPSULE BY MOUTH EVERY DAY 90 capsule 1   hydrochlorothiazide (HYDRODIURIL) 25 MG tablet Take 25 mg by mouth daily.     losartan (COZAAR) 100 MG tablet Take 100 mg by mouth daily.     metoprolol succinate (TOPROL-XL) 50 MG 24 hr tablet Take 1 tablet  (50 mg total) by mouth daily. 30 tablet 3   clopidogrel (PLAVIX) 75 MG tablet Take 1 tablet (75 mg total) by mouth daily. (Patient not taking: Reported on 03/27/2023) 30 tablet 11   No current facility-administered medications on file prior to visit.    Allergies  Allergen Reactions   Crestor [Rosuvastatin] Other (See Comments)    myalgias   Valsartan Other (See Comments)    Made patient feel tired and no energy   Warfarin And Related Other (See Comments)    Fast heart beat, went down hill, felt like he was dying    Assessment/Plan:  -  Essential hypertension Assessment: Blood pressure is at goal in clinic today Home readings very however patient is not resting prior to checking and he has several still at goal Patient very sensitive to medications He is active however we did discuss increasing his physical activity He has been losing weight however he feels discouraged.  Reminded him that a healthy weight to lose weight is just a pound or 2 a week.  It is a slow process and needs to be patient Needs BMP today since resuming hydrochlorothiazide  Plan: Check BMP today Continue losartan 100 mg daily metoprolol 50 mg daily and hydrochlorothiazide 25 mg daily Follow-up as needed    Thank you,  Samuel Chambers, Pharm.D, BCACP, BCPS, CPP [facility] of [facility] [address], [place], [place] [address]  Phone: [phone]; Fax: [phone]

## 2023-04-11 NOTE — Patient Instructions (Addendum)
Your blood pressure goal is < 130/41mmHg   Continue Losartan 100mg , Metoprolol  50mg , Hydrochlorothiazide 25mg   Important lifestyle changes to control high blood pressure  Intervention  Effect on the BP   Weight loss Weight loss is one of the most effective lifestyle changes for controlling blood pressure. If you're overweight or obese, losing even a small amount of weight can help reduce blood pressure.    Blood pressure can decrease by 1 millimeter of mercury (mmHg) with each kilogram (about 2.2 pounds) of weight lost.   Exercise regularly As a general goal, aim for 30 minutes of moderate physical activity every day.    Regular physical activity can lower blood pressure by 5 - 8 mmHg.   Eat a healthy diet Eat a diet rich in whole grains, fruits, vegetables, lean meat, and low-fat dairy products. Limit processed foods, saturated fat, and sweets.    A heart-healthy diet can lower high blood pressure by 10 mmHg.   Reduce salt (sodium) in your diet Aim for 000mg  of sodium each day. Avoid deli meats, canned food, and frozen microwave meals which are high in sodium.     Limiting sodium can reduce blood pressure by 5 mmHg.   Limit alcohol One drink equals 12 ounces of beer, 5 ounces of wine, or 1.5 ounces of 80-proof liquor.    Limiting alcohol to < 1 drink a day for women or < 2 drinks a day for men can help lower blood pressure by about 4 mmHg.   To check your pressure at home you will need to:   Sit up in a chair, with feet flat on the floor and back supported. Do not cross your ankles or legs. Rest your left arm so that the cuff is about heart level. If the cuff goes on your upper arm, then just relax your arm on the table, arm of the chair, or your lap. If you have a wrist cuff, hold your wrist against your chest at heart level. Place the cuff snugly around your arm, about 1 inch above the crease of your elbow. The cords should be inside the groove of your elbow.  Sit  quietly, with the cuff in place, for about 5 minutes. Then press the power button to start a reading. Do not talk or move while the reading is taking place.  Record your readings on a sheet of paper. Although most cuffs have a memory, it is often easier to see a pattern developing when the numbers are all in front of you.  You can repeat the reading after 1-3 minutes if it is recommended.   Make sure your bladder is empty and you have not had caffeine or tobacco within the last 30 minutes   Always bring your blood pressure log with you to your appointments. If you have not brought your monitor in to be double checked for accuracy, please bring it to your next appointment.   You can find a list of validated (accurate) blood pressure cuffs at: validatebp.org

## 2023-04-12 LAB — BASIC METABOLIC PANEL
BUN/Creatinine Ratio: 13 (ref 10–24)
BUN: 13 mg/dL (ref 8–27)
CO2: 25 mmol/L (ref 20–29)
Calcium: 9.9 mg/dL (ref 8.6–10.2)
Chloride: 100 mmol/L (ref 96–106)
Creatinine, Ser: 1.03 mg/dL (ref 0.76–1.27)
Glucose: 97 mg/dL (ref 70–99)
Potassium: 4 mmol/L (ref 3.5–5.2)
Sodium: 141 mmol/L (ref 134–144)
eGFR: 77 mL/min/{1.73_m2} (ref >59)

## 2023-05-12 ENCOUNTER — Other Ambulatory Visit: Payer: Self-pay | Admitting: Cardiovascular Disease

## 2023-05-15 ENCOUNTER — Ambulatory Visit: Payer: Medicare Other | Admitting: Neurology

## 2023-05-26 ENCOUNTER — Encounter: Payer: Self-pay | Admitting: Emergency Medicine

## 2023-05-26 ENCOUNTER — Ambulatory Visit (INDEPENDENT_AMBULATORY_CARE_PROVIDER_SITE_OTHER): Payer: Medicare Other | Admitting: Emergency Medicine

## 2023-05-26 VITALS — BP 130/86 | HR 78 | Temp 98.0°F | Ht 72.0 in | Wt 259.5 lb

## 2023-05-26 DIAGNOSIS — E785 Hyperlipidemia, unspecified: Secondary | ICD-10-CM

## 2023-05-26 DIAGNOSIS — M150 Primary generalized (osteo)arthritis: Secondary | ICD-10-CM

## 2023-05-26 DIAGNOSIS — Z8673 Personal history of transient ischemic attack (TIA), and cerebral infarction without residual deficits: Secondary | ICD-10-CM

## 2023-05-26 DIAGNOSIS — G720 Drug-induced myopathy: Secondary | ICD-10-CM

## 2023-05-26 DIAGNOSIS — Z6835 Body mass index (BMI) 35.0-35.9, adult: Secondary | ICD-10-CM

## 2023-05-26 DIAGNOSIS — T466X5A Adverse effect of antihyperlipidemic and antiarteriosclerotic drugs, initial encounter: Secondary | ICD-10-CM

## 2023-05-26 DIAGNOSIS — R7303 Prediabetes: Secondary | ICD-10-CM

## 2023-05-26 DIAGNOSIS — F418 Other specified anxiety disorders: Secondary | ICD-10-CM

## 2023-05-26 DIAGNOSIS — I10 Essential (primary) hypertension: Secondary | ICD-10-CM | POA: Diagnosis not present

## 2023-05-26 MED ORDER — LOSARTAN POTASSIUM 100 MG PO TABS
100.0000 mg | ORAL_TABLET | Freq: Every day | ORAL | 3 refills | Status: DC
Start: 2023-05-26 — End: 2024-06-03

## 2023-05-26 MED ORDER — METOPROLOL SUCCINATE ER 50 MG PO TB24: 50.0000 mg | ORAL_TABLET | Freq: Every day | ORAL | 3 refills | Status: DC

## 2023-05-26 MED ORDER — HYDROCHLOROTHIAZIDE 25 MG PO TABS
25.0000 mg | ORAL_TABLET | Freq: Every day | ORAL | 3 refills | Status: AC
Start: 2023-05-26 — End: ?

## 2023-05-26 MED ORDER — BUSPIRONE HCL 7.5 MG PO TABS: 7.5000 mg | ORAL_TABLET | Freq: Two times a day (BID) | ORAL | 2 refills | Status: DC

## 2023-05-26 NOTE — Assessment & Plan Note (Signed)
Diet and nutrition discussed.  Advised to decrease amount of daily carbohydrate intake and daily calories and increase amount of plant based protein in his diet 

## 2023-05-26 NOTE — Assessment & Plan Note (Signed)
Well-controlled hypertension with normal blood pressure readings at home. Continue losartan 100 mg daily, hydrochlorothiazide 25 mg daily and metoprolol succinate 50 mg daily Cardiovascular risks associated with hypertension discussed Dietary approaches to stop hypertension discussed

## 2023-05-26 NOTE — Assessment & Plan Note (Signed)
Stable.  Pain management discussed. Advised to avoid NSAIDs.

## 2023-05-26 NOTE — Patient Instructions (Signed)
Stroke Prevention Some medical conditions and lifestyle choices can lead to a higher risk for a stroke. You can help to prevent a stroke by eating healthy foods and exercising. It also helps to not smoke and to manage any health problems you may have. How can this condition affect me? A stroke is an emergency. It should be treated right away. A stroke can lead to brain damage or threaten your life. There is a better chance of surviving and getting better after a stroke if you get medical help right away. What can increase my risk? The following medical conditions may increase your risk of a stroke: Diseases of the heart and blood vessels (cardiovascular disease). High blood pressure (hypertension). Diabetes. High cholesterol. Sickle cell disease. Problems with blood clotting. Being very overweight. Sleeping problems (obstructivesleep apnea). Other risk factors include: Being older than age 43. A history of blood clots, stroke, or mini-stroke (TIA). Race, ethnic background, or a family history of stroke. Smoking or using tobacco products. Taking birth control pills, especially if you smoke. Heavy alcohol and drug use. Not being active. What actions can I take to prevent this? Manage your health conditions High cholesterol. Eat a healthy diet. If this is not enough to manage your cholesterol, you may need to take medicines. Take medicines as told by your doctor. High blood pressure. Try to keep your blood pressure below 130/80. If your blood pressure cannot be managed through a healthy diet and regular exercise, you may need to take medicines. Take medicines as told by your doctor. Ask your doctor if you should check your blood pressure at home. Have your blood pressure checked every year. Diabetes. Eat a healthy diet and get regular exercise. If your blood sugar (glucose) cannot be managed through diet and exercise, you may need to take medicines. Take medicines as told by your  doctor. Talk to your doctor about getting checked for sleeping problems. Signs of a problem can include: Snoring a lot. Feeling very tired. Make sure that you manage any other conditions you have. Nutrition  Follow instructions from your doctor about what to eat or drink. You may be told to: Eat and drink fewer calories each day. Limit how much salt (sodium) you use to 1,500 milligrams (mg) each day. Use only healthy fats for cooking, such as olive oil, canola oil, and sunflower oil. Eat healthy foods. To do this: Choose foods that are high in fiber. These include whole grains, and fresh fruits and vegetables. Eat at least 5 servings of fruits and vegetables a day. Try to fill one-half of your plate with fruits and vegetables at each meal. Choose low-fat (lean) proteins. These include low-fat cuts of meat, chicken without skin, fish, tofu, beans, and nuts. Eat low-fat dairy products. Avoid foods that: Are high in salt. Have saturated fat. Have trans fat. Have cholesterol. Are processed or pre-made. Count how many carbohydrates you eat and drink each day. Lifestyle If you drink alcohol: Limit how much you have to: 0-1 drink a day for women who are not pregnant. 0-2 drinks a day for men. Know how much alcohol is in your drink. In the [place] one drink equals one 12 oz bottle of beer ( ), one 5 oz glass of wine ( ), or one 1 oz glass of hard liquor (44mL). Do not smoke or use any products that have nicotine or tobacco. If you need help quitting, ask your doctor. Avoid secondhand smoke. Do not use drugs. Activity  Try to stay at a  healthy weight. Get at least 30 minutes of exercise on most days, such as: Fast walking. Biking. Swimming. Medicines Take over-the-counter and prescription medicines only as told by your doctor. Avoid taking birth control pills. Talk to your doctor about the risks of taking birth control pills if: You are over 94 years old. You smoke. You get  very bad headaches. You have had a blood clot. Where to find more information American Stroke Association: www.strokeassociation.org Get help right away if: You or a loved one has any signs of a stroke. "BE FAST" is an easy way to remember the warning signs: B - Balance. Dizziness, sudden trouble walking, or loss of balance. E - Eyes. Trouble seeing or a change in how you see. F - Face. Sudden weakness or loss of feeling of the face. The face or eyelid may droop on one side. A - Arms. Weakness or loss of feeling in an arm. This happens all of a sudden and most often on one side of the body. S - Speech. Sudden trouble speaking, slurred speech, or trouble understanding what people say. T - Time. Time to call emergency services. Write down what time symptoms started. You or a loved one has other signs of a stroke, such as: A sudden, very bad headache with no known cause. Feeling like you may vomit (nausea). Vomiting. A seizure. These symptoms may be an emergency. Get help right away. Call your local emergency services (911 in the [place] Do not wait to see if the symptoms will go away. Do not drive yourself to the hospital. Summary You can help to prevent a stroke by eating healthy, exercising, and not smoking. It also helps to manage any health problems you have. Do not smoke or use any products that contain nicotine or tobacco. Get help right away if you or a loved one has any signs of a stroke. This information is not intended to replace advice given to you by your health care provider. Make sure you discuss any questions you have with your health care provider. Document Revised: 06/17/2022 Document Reviewed: 06/17/2022 Elsevier Patient Education  2024 [employer].

## 2023-05-26 NOTE — Assessment & Plan Note (Signed)
Diet and nutrition discussed Intolerant to statins

## 2023-05-26 NOTE — Assessment & Plan Note (Signed)
Stroke prevention measures discussed Blood pressure well-controlled No diabetes. Cholesterol management discussed Diet and nutrition discussed Benefits of exercise discussed Not taking Plavix.  Continue daily baby aspirin.

## 2023-05-26 NOTE — Progress Notes (Signed)
Samuel Chambers 72 y.o.   Chief Complaint  Patient presents with   Medical Management of Chronic Issues    f/u appt, patient states he has some questions about is medication     HISTORY OF PRESENT ILLNESS: This is a 72 y.o. male here for follow-up of chronic medical conditions including hypertension Valsartan did not agree with him.  Switch to losartan which is working better.  Also taking hydrochlorothiazide on metoprolol succinate Intolerant to statins.  Did follow-up with cardiologist regarding Repatha.  Does not want to take it due to possible side effects. History of TIA.  Was on aspirin and Plavix.  No longer taking Plavix.  Only taking daily baby aspirin. No other complaints or medical concerns today.  HPI   Prior to Admission medications   Medication Sig Start Date End Date Taking? Authorizing Provider  aspirin EC 81 MG tablet Take 81 mg by mouth daily. Swallow whole.   Yes [provider]  busPIRone (BUSPAR) 7.5 MG tablet Take 1 tablet (7.5 mg total) by mouth 2 (two) times daily. 05/26/23  Yes Georgina Quint, MD  DULoxetine (CYMBALTA) 60 MG capsule TAKE 1 CAPSULE BY MOUTH EVERY DAY 02/12/23  Yes Jayvian Escoe, Eilleen Kempf, MD  ofloxacin (OCUFLOX) 0.3 % ophthalmic solution  05/22/23  Yes [provider]  prednisoLONE acetate (PRED FORTE) 1 % ophthalmic suspension SMARTSIG:In Eye(s) 05/22/23  Yes [provider]  hydrochlorothiazide (HYDRODIURIL) 25 MG tablet Take 1 tablet (25 mg total) by mouth daily. 05/26/23   Georgina Quint, MD  losartan (COZAAR) 100 MG tablet Take 1 tablet (100 mg total) by mouth daily. 05/26/23   Georgina Quint, MD  metoprolol succinate (TOPROL-XL) 50 MG 24 hr tablet Take 1 tablet (50 mg total) by mouth daily. 05/26/23   Georgina Quint, MD    Allergies  Allergen Reactions   Amlodipine Swelling    Patient had severe swelling   Crestor [Rosuvastatin] Other (See Comments)    myalgias   Valsartan  Other (See Comments)    Made patient feel tired and no energy   Warfarin And Related Other (See Comments)    Fast heart beat, went down hill, felt like he was dying    Patient Active Problem List   Diagnosis Date Noted   Statin myopathy 03/11/2023   Prediabetes 08/15/2022   Arthritis of hand 07/27/2022   Acquired trigger finger of left ring finger 07/27/2022   History of gout 02/07/2021   Primary osteoarthritis of right knee 01/24/2020   OA (osteoarthritis) of hip 03/16/2018   Severe obesity (BMI 35.0-35.9 with comorbidity) (HCC) 08/04/2014   Dyslipidemia 04/29/2013   CAD S/P percutaneous coronary angioplasty    S/P laparoscopic cholecystectomy July 2014 02/18/2013   OA (osteoarthritis) of knee 09/04/2012   Anxiety state 02/24/2007   Essential hypertension 02/24/2007   GERD 02/24/2007   Osteoarthritis of multiple joints 02/24/2007    Past Medical History:  Diagnosis Date   Adenomatous colon polyp 03/2005   Anxiety    CAD S/P percutaneous coronary angioplasty 2008   PCI to circumflex with a Promus DES 2.5 mm x 8 mm   Cataract    BILATERAL,REMOVED   Degenerative joint disease    Diabetes mellitus without complication (HCC)    diet controlled,PT.DENIES UPDATED 04/10/22   Diverticulosis    Fatty liver 2016   pt denies   Fibromyalgia    GERD (gastroesophageal reflux disease)    Heart murmur    [employer]reesia 07/29/2020   Hyperlipidemia  statin intolerant   Hypertension    Internal hemorrhoids    Obesity, Class II, BMI 35-39.9    Pneumonia 2016   PONV (postoperative nausea and vomiting)     Past Surgical History:  Procedure Laterality Date   CHOLECYSTECTOMY N/A 02/18/2013   Procedure: LAPAROSCOPIC CHOLECYSTECTOMY WITH INTRAOPERATIVE CHOLANGIOGRAM;  Surgeon: Valarie Merino, MD;  Location: [facility];  Service: General;  Laterality: N/A;   COLONOSCOPY     COLONOSCOPY  05/27/2022   CORONARY ANGIOPLASTY WITH STENT PLACEMENT  04/30/2007   2.5 mm Promus stent that was to 95%  Circ;had 60-70% lesion to RCA   DOPPLER ECHOCARDIOGRAPHY  05/27/2002   [facility]normal EF 55-66%,   FOOT SURGERY Right    x2 fascia   HEEL SPUR SURGERY Right    HEMORRHOID SURGERY     Holter Monitor  04/07/2007   sinus tachy.;   JOINT REPLACEMENT N/A    [employer]reesia 07/29/2020   KNEE ARTHROSCOPY  03/27/2012   Procedure: ARTHROSCOPY KNEE;  Surgeon: Loanne Drilling, MD;  Location: [facility];  Service: Orthopedics;  Laterality: Left;   NM MYOCAR PERF WALL MOTION  12/19/2011   EXERCISED FOR 8-1/2 MINUTES RECHING 10 METABOLIC EQUIVALENTS. NO EVIDENCE  OF ISCHEMIA OR INFARCTION . EF 71%   POLYPECTOMY     renal dopplers  04/08/2007   relatively normal   TOTAL HIP ARTHROPLASTY Right 03/16/2018   Procedure: RIGHT TOTAL HIP ARTHROPLASTY ANTERIOR APPROACH;  Surgeon: Ollen Gross, MD;  Location: [facility];  Service: Orthopedics;  Laterality: Right;   TOTAL HIP ARTHROPLASTY Left about 10 to 12 years ago   TOTAL KNEE ARTHROPLASTY Left 09/04/2012   Procedure: TOTAL KNEE ARTHROPLASTY;  Surgeon: Loanne Drilling, MD;  Location: [facility];  Service: Orthopedics;  Laterality: Left;   TOTAL KNEE ARTHROPLASTY Right 01/24/2020   Procedure: TOTAL KNEE ARTHROPLASTY;  Surgeon: Ollen Gross, MD;  Location: [facility];  Service: Orthopedics;  Laterality: Right;     Social History   Socioeconomic History   Marital status: Married    Spouse name: Not on file   Number of children: 1   Years of education: Not on file   Highest education level: 12th grade  Occupational History    Employer: [employer]  Tobacco Use   Smoking status: Never    Passive exposure: Past (PARENTS SMOKED)   Smokeless tobacco: Never  Vaping Use   Vaping status: Never Used  Substance and Sexual Activity   Alcohol use: No   Drug use: No   Sexual activity: Not on file  Other Topics Concern   Not on file  Social History Narrative   Very little 0-2 drinks a week   Social Determinants of Health   Financial Resource Strain:  Patient Declined (05/22/2023)   Overall Financial Resource Strain (CARDIA)    Difficulty of Paying Living Expenses: Patient declined  Food Insecurity: No Food Insecurity (05/22/2023)   Hunger Vital Sign    Worried About Running Out of Food in the Last Year: Never true    Ran Out of Food in the Last Year: Never true  Transportation Needs: No Transportation Needs (05/22/2023)   PRAPARE - Administrator, Civil Service (Medical): No    Lack of Transportation (Non-Medical): No  Physical Activity: Unknown (05/22/2023)   Exercise Vital Sign    Days of Exercise per Week: Patient declined    Minutes of Exercise per Session: 0 min  Stress: No Stress Concern Present (05/22/2023)   [employer] of  Occupational Health - Occupational Stress Questionnaire    Feeling of Stress : Only a little  Social Connections: Socially Integrated (05/22/2023)   Social Connection and Isolation Panel [NHANES]    Frequency of Communication with Friends and Family: More than three times a week    Frequency of Social Gatherings with Friends and Family: More than three times a week    Attends Religious Services: More than 4 times per year    Active Member of [employer] or Organizations: Yes    Attends Engineer, structural: More than 4 times per year    Marital Status: Married  Catering manager Violence: Not At Risk (02/11/2023)   Humiliation, Afraid, Rape, and Kick questionnaire    Fear of Current or Ex-Partner: No    Emotionally Abused: No    Physically Abused: No    Sexually Abused: No    Family History  Problem Relation Age of Onset   Breast cancer Mother    Diabetes Mother    Prostate cancer Father    Colon cancer Neg Hx    Colon polyps Neg Hx    Esophageal cancer Neg Hx    Rectal cancer Neg Hx    Stomach cancer Neg Hx    Ulcerative colitis Neg Hx    Crohn's disease Neg Hx      Review of Systems  Constitutional: Negative.  Negative for chills and fever.  HENT: Negative.   Negative for congestion and sore throat.   Respiratory: Negative.  Negative for cough and shortness of breath.   Cardiovascular: Negative.  Negative for chest pain and palpitations.  Gastrointestinal:  Negative for abdominal pain, diarrhea, nausea and vomiting.  Genitourinary: Negative.  Negative for dysuria and hematuria.  Skin: Negative.  Negative for rash.  Neurological: Negative.  Negative for dizziness and headaches.  All other systems reviewed and are negative.   Vitals:   05/26/23 0930  BP: 130/86  Pulse: 78  Temp: 98 F (36.7 C)  SpO2: 96%    Physical Exam Vitals reviewed.  Constitutional:      Appearance: Normal appearance.  HENT:     Head: Normocephalic.  Eyes:     Extraocular Movements: Extraocular movements intact.  Cardiovascular:     Rate and Rhythm: Normal rate and regular rhythm.     Pulses: Normal pulses.     Heart sounds: Normal heart sounds.  Pulmonary:     Effort: Pulmonary effort is normal.     Breath sounds: Normal breath sounds.  Skin:    General: Skin is warm and dry.  Neurological:     General: No focal deficit present.     Mental Status: He is alert and oriented to person, place, and time.  Psychiatric:        Mood and Affect: Mood normal.        Behavior: Behavior normal.      ASSESSMENT & PLAN: A total of 44 minutes was spent with the patient and counseling/coordination of care regarding preparing for this visit, review of most recent office visit notes, review of multiple chronic medical conditions under management, review of most recent blood work results, review of all medications, cardiovascular risks associated with hypertension and dyslipidemia, stroke prevention measures, prognosis, education on nutrition, documentation and need for follow-up.  Problem List Items Addressed This Visit       Cardiovascular and Mediastinum   Essential hypertension - Primary (Chronic)    Well-controlled hypertension with normal blood pressure  readings at home. Continue losartan 100  mg daily, hydrochlorothiazide 25 mg daily and metoprolol succinate 50 mg daily Cardiovascular risks associated with hypertension discussed Dietary approaches to stop hypertension discussed      Relevant Medications   metoprolol succinate (TOPROL-XL) 50 MG 24 hr tablet   hydrochlorothiazide (HYDRODIURIL) 25 MG tablet   losartan (COZAAR) 100 MG tablet     Musculoskeletal and Integument   Osteoarthritis of multiple joints    Stable.  Pain management discussed. Advised to avoid NSAIDs.      Statin myopathy     Other   Situational anxiety    Known triggers. May benefit from BuSpar 7.5 mg twice a day.      Relevant Medications   busPIRone (BUSPAR) 7.5 MG tablet   Dyslipidemia    Diet and nutrition discussed Intolerant to statins      Severe obesity (BMI 35.0-35.9 with comorbidity) (HCC)    Diet and nutrition discussed. Advised to decrease amount of daily carbohydrate intake and daily calories and increase amount of plant-based protein in his diet      Prediabetes    Stable.  Diet and nutrition discussed      History of TIA (transient ischemic attack)    Stroke prevention measures discussed Blood pressure well-controlled No diabetes. Cholesterol management discussed Diet and nutrition discussed Benefits of exercise discussed Not taking Plavix.  Continue daily baby aspirin.      Patient Instructions  Stroke Prevention Some medical conditions and lifestyle choices can lead to a higher risk for a stroke. You can help to prevent a stroke by eating healthy foods and exercising. It also helps to not smoke and to manage any health problems you may have. How can this condition affect me? A stroke is an emergency. It should be treated right away. A stroke can lead to brain damage or threaten your life. There is a better chance of surviving and getting better after a stroke if you get medical help right away. What can increase my  risk? The following medical conditions may increase your risk of a stroke: Diseases of the heart and blood vessels (cardiovascular disease). High blood pressure (hypertension). Diabetes. High cholesterol. Sickle cell disease. Problems with blood clotting. Being very overweight. Sleeping problems (obstructivesleep apnea). Other risk factors include: Being older than age 61. A history of blood clots, stroke, or mini-stroke (TIA). Race, ethnic background, or a family history of stroke. Smoking or using tobacco products. Taking birth control pills, especially if you smoke. Heavy alcohol and drug use. Not being active. What actions can I take to prevent this? Manage your health conditions High cholesterol. Eat a healthy diet. If this is not enough to manage your cholesterol, you may need to take medicines. Take medicines as told by your doctor. High blood pressure. Try to keep your blood pressure below 130/80. If your blood pressure cannot be managed through a healthy diet and regular exercise, you may need to take medicines. Take medicines as told by your doctor. Ask your doctor if you should check your blood pressure at home. Have your blood pressure checked every year. Diabetes. Eat a healthy diet and get regular exercise. If your blood sugar (glucose) cannot be managed through diet and exercise, you may need to take medicines. Take medicines as told by your doctor. Talk to your doctor about getting checked for sleeping problems. Signs of a problem can include: Snoring a lot. Feeling very tired. Make sure that you manage any other conditions you have. Nutrition  Follow instructions from your doctor about  what to eat or drink. You may be told to: Eat and drink fewer calories each day. Limit how much salt (sodium) you use to 1,500 milligrams (mg) each day. Use only healthy fats for cooking, such as olive oil, canola oil, and sunflower oil. Eat healthy foods. To do this: Choose  foods that are high in fiber. These include whole grains, and fresh fruits and vegetables. Eat at least 5 servings of fruits and vegetables a day. Try to fill one-half of your plate with fruits and vegetables at each meal. Choose low-fat (lean) proteins. These include low-fat cuts of meat, chicken without skin, fish, tofu, beans, and nuts. Eat low-fat dairy products. Avoid foods that: Are high in salt. Have saturated fat. Have trans fat. Have cholesterol. Are processed or pre-made. Count how many carbohydrates you eat and drink each day. Lifestyle If you drink alcohol: Limit how much you have to: 0-1 drink a day for women who are not pregnant. 0-2 drinks a day for men. Know how much alcohol is in your drink. In the [place] one drink equals one 12 oz bottle of beer ( ), one 5 oz glass of wine ( ), or one 1 oz glass of hard liquor (44mL). Do not smoke or use any products that have nicotine or tobacco. If you need help quitting, ask your doctor. Avoid secondhand smoke. Do not use drugs. Activity  Try to stay at a healthy weight. Get at least 30 minutes of exercise on most days, such as: Fast walking. Biking. Swimming. Medicines Take over-the-counter and prescription medicines only as told by your doctor. Avoid taking birth control pills. Talk to your doctor about the risks of taking birth control pills if: You are over 53 years old. You smoke. You get very bad headaches. You have had a blood clot. Where to find more information American Stroke Association: www.strokeassociation.org Get help right away if: You or a loved one has any signs of a stroke. "BE FAST" is an easy way to remember the warning signs: B - Balance. Dizziness, sudden trouble walking, or loss of balance. E - Eyes. Trouble seeing or a change in how you see. F - Face. Sudden weakness or loss of feeling of the face. The face or eyelid may droop on one side. A - Arms. Weakness or loss of feeling in an arm.  This happens all of a sudden and most often on one side of the body. S - Speech. Sudden trouble speaking, slurred speech, or trouble understanding what people say. T - Time. Time to call emergency services. Write down what time symptoms started. You or a loved one has other signs of a stroke, such as: A sudden, very bad headache with no known cause. Feeling like you may vomit (nausea). Vomiting. A seizure. These symptoms may be an emergency. Get help right away. Call your local emergency services (911 in the [place] Do not wait to see if the symptoms will go away. Do not drive yourself to the hospital. Summary You can help to prevent a stroke by eating healthy, exercising, and not smoking. It also helps to manage any health problems you have. Do not smoke or use any products that contain nicotine or tobacco. Get help right away if you or a loved one has any signs of a stroke. This information is not intended to replace advice given to you by your health care provider. Make sure you discuss any questions you have with your health care provider. Document Revised: 06/17/2022 Document Reviewed: 06/17/2022  Elsevier Patient Education  2024 Elsevier Inc.      Edwina Barth, MD [facility]

## 2023-05-26 NOTE — Assessment & Plan Note (Signed)
Known triggers. May benefit from BuSpar 7.5 mg twice a day.

## 2023-05-26 NOTE — Assessment & Plan Note (Signed)
Stable.  Diet and nutrition discussed. 

## 2023-06-02 ENCOUNTER — Ambulatory Visit: Payer: Medicare Other | Admitting: Family Medicine

## 2023-06-02 ENCOUNTER — Encounter: Payer: Self-pay | Admitting: Family Medicine

## 2023-06-02 VITALS — BP 124/68 | HR 70 | Temp 98.6°F | Resp 20 | Ht 72.0 in | Wt 260.0 lb

## 2023-06-02 DIAGNOSIS — J0100 Acute maxillary sinusitis, unspecified: Secondary | ICD-10-CM

## 2023-06-02 DIAGNOSIS — R062 Wheezing: Secondary | ICD-10-CM

## 2023-06-02 LAB — POCT INFLUENZA A/B
Influenza A, POC: NEGATIVE
Influenza B, POC: NEGATIVE

## 2023-06-02 MED ORDER — ALBUTEROL SULFATE HFA 108 (90 BASE) MCG/ACT IN AERS: 2.0000 | INHALATION_SPRAY | Freq: Four times a day (QID) | RESPIRATORY_TRACT | 0 refills | Status: DC | PRN

## 2023-06-02 MED ORDER — AMOXICILLIN-POT CLAVULANATE 875-125 MG PO TABS: 1.0000 | ORAL_TABLET | Freq: Two times a day (BID) | ORAL | 0 refills | Status: AC

## 2023-06-02 MED ORDER — PREDNISONE 10 MG (21) PO TBPK: ORAL_TABLET | ORAL | 0 refills | Status: DC

## 2023-06-02 MED ORDER — PROMETHAZINE-DM 6.25-15 MG/5ML PO SYRP: 5.0000 mL | ORAL_SOLUTION | Freq: Four times a day (QID) | ORAL | 0 refills | Status: DC | PRN

## 2023-06-02 NOTE — Patient Instructions (Addendum)
Leqvio

## 2023-06-02 NOTE — Progress Notes (Signed)
Assessment & Plan:  1. Acute non-recurrent maxillary sinusitis Education provided on sinusitis.  Discussed appropriate use of albuterol inhaler which patient was able to teach back. - POCT Influenza A/B - amoxicillin-clavulanate (AUGMENTIN) 875-125 MG tablet; Take 1 tablet by mouth 2 (two) times daily for 7 days.  Dispense: 14 tablet; Refill: 0 - predniSONE (STERAPRED UNI-PAK 21 TAB) 10 MG (21) TBPK tablet; As directed x 6 days  Dispense: 21 tablet; Refill: 0 - albuterol (VENTOLIN HFA) 108 (90 Base) MCG/ACT inhaler; Inhale 2 puffs into the lungs every 6 (six) hours as needed for wheezing or shortness of breath.  Dispense: 18 g; Refill: 0 - promethazine-dextromethorphan (PROMETHAZINE-DM) 6.25-15 MG/5ML syrup; Take 5 mLs by mouth 4 (four) times daily as needed for cough.  Dispense: 118 mL; Refill: 0  Results for orders placed or performed in visit on 06/02/23  POCT Influenza A/B  Result Value Ref Range   Influenza A, POC Negative Negative   Influenza B, POC Negative Negative    Follow up plan: Return if symptoms worsen or fail to improve.  Deliah Boston, MSN, APRN, FNP-C  Subjective:  HPI: Samuel Chambers is a 72 y.o. male presenting on 06/02/2023 for Cough (Congestion, SOB started Friday (had a ST - better now) )  Patient is accompanied by his wife, who he is okay with being present.  Patient complains of cough, head/chest congestion, sneezing, sore throat, shortness of breath, and wheezing. He denies fever, nausea, vomiting, abdominal pain, and diarrhea. Onset of symptoms was 3 days ago, gradually worsening since that time. He is drinking moderate amounts of fluids. Evaluation to date: none. Treatment to date:  Coricidin HBP . He does not smoke.    ROS: Negative unless specifically indicated above in HPI.   Relevant past medical history reviewed and updated as indicated.   Allergies and medications reviewed and updated.   Current Outpatient Medications:    aspirin EC 81 MG  tablet, Take 81 mg by mouth daily. Swallow whole., Disp: , Rfl:    DULoxetine (CYMBALTA) 60 MG capsule, TAKE 1 CAPSULE BY MOUTH EVERY DAY, Disp: 90 capsule, Rfl: 1   hydrochlorothiazide (HYDRODIURIL) 25 MG tablet, Take 1 tablet (25 mg total) by mouth daily., Disp: 90 tablet, Rfl: 3   losartan (COZAAR) 100 MG tablet, Take 1 tablet (100 mg total) by mouth daily., Disp: 90 tablet, Rfl: 3   metoprolol succinate (TOPROL-XL) 50 MG 24 hr tablet, Take 1 tablet (50 mg total) by mouth daily., Disp: 90 tablet, Rfl: 3   ofloxacin (OCUFLOX) 0.3 % ophthalmic solution, , Disp: , Rfl:    prednisoLONE acetate (PRED FORTE) 1 % ophthalmic suspension, SMARTSIG:In Eye(s), Disp: , Rfl:    busPIRone (BUSPAR) 7.5 MG tablet, Take 1 tablet (7.5 mg total) by mouth 2 (two) times daily. (Patient not taking: Reported on 06/02/2023), Disp: 30 tablet, Rfl: 2  Allergies  Allergen Reactions   Amlodipine Swelling    Patient had severe swelling   Crestor [Rosuvastatin] Other (See Comments)    myalgias   Valsartan Other (See Comments)    Made patient feel tired and no energy   Warfarin And Related Other (See Comments)    Fast heart beat, went down hill, felt like he was dying    Objective:   BP 124/68   Pulse 70   Temp 98.6 F (37 C)   Resp 20   Ht 6' (1.829 m)   Wt 260 lb (117.9 kg)   SpO2 97%   BMI 35.26 kg/m  Physical Exam Vitals reviewed.  Constitutional:      General: He is not in acute distress.    Appearance: Normal appearance. He is not ill-appearing, toxic-appearing or diaphoretic.  HENT:     Head: Normocephalic and atraumatic.     Right Ear: Tympanic membrane, ear canal and external ear normal. There is no impacted cerumen.     Left Ear: Tympanic membrane, ear canal and external ear normal. There is no impacted cerumen.     Nose:     Right Sinus: Maxillary sinus tenderness present. No frontal sinus tenderness.     Left Sinus: Maxillary sinus tenderness present. No frontal sinus tenderness.      Mouth/Throat:     Mouth: Mucous membranes are moist.     Pharynx: Oropharynx is clear. No oropharyngeal exudate or posterior oropharyngeal erythema.     Tonsils: No tonsillar exudate or tonsillar abscesses.  Eyes:     General: No scleral icterus.       Right eye: No discharge.        Left eye: No discharge.     Conjunctiva/sclera: Conjunctivae normal.  Cardiovascular:     Rate and Rhythm: Normal rate.  Pulmonary:     Effort: Pulmonary effort is normal. No respiratory distress.  Musculoskeletal:        General: Normal range of motion.     Cervical back: Normal range of motion.  Lymphadenopathy:     Cervical: No cervical adenopathy.  Skin:    General: Skin is warm and dry.  Neurological:     Mental Status: He is alert and oriented to person, place, and time. Mental status is at baseline.  Psychiatric:        Mood and Affect: Mood normal.        Behavior: Behavior normal.        Thought Content: Thought content normal.        Judgment: Judgment normal.

## 2023-06-04 ENCOUNTER — Other Ambulatory Visit: Payer: Self-pay | Admitting: Emergency Medicine

## 2023-06-04 DIAGNOSIS — F418 Other specified anxiety disorders: Secondary | ICD-10-CM

## 2023-06-12 ENCOUNTER — Telehealth: Payer: Self-pay | Admitting: Emergency Medicine

## 2023-06-12 NOTE — Telephone Encounter (Signed)
Continue over-the-counter Mucinex DM and cough drops.  Thanks.

## 2023-06-12 NOTE — Telephone Encounter (Signed)
Called patient and informed him of provider recommendation.  

## 2023-06-12 NOTE — Telephone Encounter (Signed)
Patient saw Deliah Boston on 06/02/23 for a cough and was prescribed medication to help. He has gotten slightly better, but the cough has not gone away at all.  Patient would like to know what he can do or if he needs to take something else. Best callback is [phone].

## 2023-06-12 NOTE — Telephone Encounter (Signed)
Called patient and informed him of provider recommendation. Patient stated that he was taking all the cough syrup to go to sleep and he states that he still was not better. He stated that multiple rounds of antibiotic in the past helped his cough. I informed patient that the provider stated to take OTC Mucinex and cough drops. Patient was prescribed prednisone, albuterol inhaler, amoxicillin and cough syrup. Patient stated we wanted him to " suffer"  told him no that the provider stated his recommendation. I even offered the patient an appointment tomorrow with a provider and he refused.

## 2023-06-18 ENCOUNTER — Ambulatory Visit: Payer: Medicare Other | Admitting: Emergency Medicine

## 2023-06-18 ENCOUNTER — Encounter: Payer: Self-pay | Admitting: Emergency Medicine

## 2023-06-18 ENCOUNTER — Ambulatory Visit (INDEPENDENT_AMBULATORY_CARE_PROVIDER_SITE_OTHER): Payer: Medicare Other

## 2023-06-18 VITALS — BP 124/72 | HR 84 | Temp 98.1°F | Ht 72.0 in | Wt 264.0 lb

## 2023-06-18 DIAGNOSIS — J22 Unspecified acute lower respiratory infection: Secondary | ICD-10-CM | POA: Insufficient documentation

## 2023-06-18 DIAGNOSIS — R051 Acute cough: Secondary | ICD-10-CM

## 2023-06-18 DIAGNOSIS — E785 Hyperlipidemia, unspecified: Secondary | ICD-10-CM

## 2023-06-18 DIAGNOSIS — I10 Essential (primary) hypertension: Secondary | ICD-10-CM | POA: Diagnosis not present

## 2023-06-18 MED ORDER — HYDROCODONE BIT-HOMATROP MBR 5-1.5 MG/5ML PO SOLN: 5.0000 mL | Freq: Every evening | ORAL | 0 refills | Status: DC | PRN

## 2023-06-18 MED ORDER — PREDNISONE 20 MG PO TABS: 40.0000 mg | ORAL_TABLET | Freq: Every day | ORAL | 0 refills | Status: AC

## 2023-06-18 MED ORDER — AZITHROMYCIN 250 MG PO TABS: ORAL_TABLET | ORAL | 0 refills | Status: DC

## 2023-06-18 MED ORDER — BENZONATATE 200 MG PO CAPS: 200.0000 mg | ORAL_CAPSULE | Freq: Two times a day (BID) | ORAL | 0 refills | Status: DC | PRN

## 2023-06-18 NOTE — Assessment & Plan Note (Addendum)
Cough management discussed Advised to continue over-the-counter Mucinex DM and cough drops Will benefit from prednisone 40 mg daily for 5 days Tessalon 200 mg 3 times a day Hycodan syrup at bedtime Advised to rest and stay well-hydrated

## 2023-06-18 NOTE — Assessment & Plan Note (Signed)
 Well-controlled hypertension with normal blood pressure readings at home. Continue losartan 100 mg daily, hydrochlorothiazide 25 mg daily and metoprolol succinate 50 mg daily Cardiovascular risks associated with hypertension discussed Dietary approaches to stop hypertension discussed

## 2023-06-18 NOTE — Patient Instructions (Signed)
Acute Bronchitis, Adult  Acute bronchitis is when air tubes in the lungs (bronchi) suddenly get swollen. The condition can make it hard for you to breathe. In adults, acute bronchitis usually goes away within 2 weeks. A cough caused by bronchitis may last up to 3 weeks. Smoking, allergies, and asthma can make the condition worse. What are the causes? Germs that cause cold and flu (viruses). The most common cause of this condition is the virus that causes the common cold. Bacteria. Substances that bother (irritate) the lungs, including: Smoke from cigarettes and other types of tobacco. Dust and pollen. Fumes from chemicals, gases, or burned fuel. Indoor or outdoor air pollution. What increases the risk? A weak body's defense system. This is also called the immune system. Any condition that affects your lungs and breathing, such as asthma. What are the signs or symptoms? A cough. Coughing up clear, yellow, or green mucus. Making high-pitched whistling sounds when you breathe, most often when you breathe out (wheezing). Runny or stuffy nose. Having too much mucus in your lungs (chest congestion). Shortness of breath. Body aches. A sore throat. How is this treated? Acute bronchitis may go away over time without treatment. Your doctor may tell you to: Drink more fluids. This will help thin your mucus so it is easier to cough up. Use a device that gets medicine into your lungs (inhaler). Use a vaporizer or a humidifier. These are machines that add water to the air. This helps with coughing and poor breathing. Take a medicine that thins mucus and helps clear it from your lungs. Take a medicine that prevents or stops coughing. It is not common to take an antibiotic medicine for this condition. Follow these instructions at home:  Take over-the-counter and prescription medicines only as told by your doctor. Use an inhaler, vaporizer, or humidifier as told by your doctor. Take two teaspoons  (10 mL) of honey at bedtime. This helps lessen your coughing at night. Drink enough fluid to keep your pee (urine) pale yellow. Do not smoke or use any products that contain nicotine or tobacco. If you need help quitting, ask your doctor. Get a lot of rest. Return to your normal activities when your doctor says that it is safe. Keep all follow-up visits. How is this prevented?  Wash your hands often with soap and water for at least 20 seconds. If you cannot use soap and water, use hand sanitizer. Avoid contact with people who have cold symptoms. Try not to touch your mouth, nose, or eyes with your hands. Avoid breathing in smoke or chemical fumes. Make sure to get the flu shot every year. Contact a doctor if: Your symptoms do not get better in 2 weeks. You have trouble coughing up the mucus. Your cough keeps you awake at night. You have a fever. Get help right away if: You cough up blood. You have chest pain. You have very bad shortness of breath. You faint or keep feeling like you are going to faint. You have a very bad headache. Your fever or chills get worse. These symptoms may be an emergency. Get help right away. Call your local emergency services (911 in the [place] Do not wait to see if the symptoms will go away. Do not drive yourself to the hospital. Summary Acute bronchitis is when air tubes in the lungs (bronchi) suddenly get swollen. In adults, acute bronchitis usually goes away within 2 weeks. Drink more fluids. This will help thin your mucus so it is easier  to cough up. Take over-the-counter and prescription medicines only as told by your doctor. Contact a doctor if your symptoms do not improve after 2 weeks of treatment. This information is not intended to replace advice given to you by your health care provider. Make sure you discuss any questions you have with your health care provider. Document Revised: 11/15/2020 Document Reviewed: 11/15/2020 Elsevier Patient  Education  2024 [employer].

## 2023-06-18 NOTE — Progress Notes (Signed)
Samuel Chambers 72 y.o.   Chief Complaint  Patient presents with   Cough    Patient states sinus congestion, coughing up brown, greenish yellow mucus. Pressure under right eye. Still having same symptoms but feels worse w/SOB    HISTORY OF PRESENT ILLNESS: Acute problem visit today. This is a 72 y.o. male A1A complaining of productive persistent cough for the past 3 weeks Seen on 06/02/2023 and diagnosed with acute sinusitis.  Started on Augmentin, prednisone, albuterol, and Phenergan DM for cough. Not better.  Progressively getting worse. No other complaints or medical concerns today.  Cough Pertinent negatives include no chest pain, chills, fever, headaches, rash, shortness of breath or wheezing.     Prior to Admission medications   Medication Sig Start Date End Date Taking? Authorizing Provider  albuterol (VENTOLIN HFA) 108 (90 Base) MCG/ACT inhaler Inhale 2 puffs into the lungs every 6 (six) hours as needed for wheezing or shortness of breath. 06/02/23  Yes Deliah Boston F, FNP  aspirin EC 81 MG tablet Take 81 mg by mouth daily. Swallow whole.   Yes [provider]  DULoxetine (CYMBALTA) 60 MG capsule TAKE 1 CAPSULE BY MOUTH EVERY DAY 02/12/23  Yes Judeen Geralds, Eilleen Kempf, MD  hydrochlorothiazide (HYDRODIURIL) 25 MG tablet Take 1 tablet (25 mg total) by mouth daily. 05/26/23  Yes Tywon Niday, Eilleen Kempf, MD  losartan (COZAAR) 100 MG tablet Take 1 tablet (100 mg total) by mouth daily. 05/26/23  Yes Kiyanna Biegler, Eilleen Kempf, MD  metoprolol succinate (TOPROL-XL) 50 MG 24 hr tablet Take 1 tablet (50 mg total) by mouth daily. 05/26/23  Yes Analicia Skibinski, Eilleen Kempf, MD  ofloxacin (OCUFLOX) 0.3 % ophthalmic solution  05/22/23  Yes [provider]  prednisoLONE acetate (PRED FORTE) 1 % ophthalmic suspension SMARTSIG:In Eye(s) 05/22/23  Yes [provider]  busPIRone (BUSPAR) 7.5 MG tablet TAKE 1 TABLET BY MOUTH 2 TIMES DAILY. 06/05/23   Georgina Quint, MD   predniSONE (STERAPRED UNI-PAK 21 TAB) 10 MG (21) TBPK tablet As directed x 6 days 06/02/23   Gwenlyn Fudge, FNP  promethazine-dextromethorphan (PROMETHAZINE-DM) 6.25-15 MG/5ML syrup Take 5 mLs by mouth 4 (four) times daily as needed for cough. 06/02/23   Gwenlyn Fudge, FNP    Allergies  Allergen Reactions   Amlodipine Swelling    Patient had severe swelling   Crestor [Rosuvastatin] Other (See Comments)    myalgias   Valsartan Other (See Comments)    Made patient feel tired and no energy   Warfarin And Related Other (See Comments)    Fast heart beat, went down hill, felt like he was dying    Patient Active Problem List   Diagnosis Date Noted   History of TIA (transient ischemic attack) 05/26/2023   Statin myopathy 03/11/2023   Prediabetes 08/15/2022   Arthritis of hand 07/27/2022   Acquired trigger finger of left ring finger 07/27/2022   History of gout 02/07/2021   Primary osteoarthritis of right knee 01/24/2020   OA (osteoarthritis) of hip 03/16/2018   Severe obesity (BMI 35.0-35.9 with comorbidity) (HCC) 08/04/2014   Dyslipidemia 04/29/2013   CAD S/P percutaneous coronary angioplasty    S/P laparoscopic cholecystectomy July 2014 02/18/2013   OA (osteoarthritis) of knee 09/04/2012   Situational anxiety 02/24/2007   Essential hypertension 02/24/2007   GERD 02/24/2007   Osteoarthritis of multiple joints 02/24/2007    Past Medical History:  Diagnosis Date   Adenomatous colon polyp 03/2005   Anxiety    CAD S/P percutaneous coronary angioplasty  2008   PCI to circumflex with a Promus DES 2.5 mm x 8 mm   Cataract    BILATERAL,REMOVED   Degenerative joint disease    Diabetes mellitus without complication (HCC)    diet controlled,PT.DENIES UPDATED 04/10/22   Diverticulosis    Fatty liver 2016   pt denies   Fibromyalgia    GERD (gastroesophageal reflux disease)    Heart murmur    Phreesia 07/29/2020   Hyperlipidemia    statin intolerant   Hypertension    Internal  hemorrhoids    Obesity, Class II, BMI 35-39.9    Pneumonia 2016   PONV (postoperative nausea and vomiting)     Past Surgical History:  Procedure Laterality Date   CHOLECYSTECTOMY N/A 02/18/2013   Procedure: LAPAROSCOPIC CHOLECYSTECTOMY WITH INTRAOPERATIVE CHOLANGIOGRAM;  Surgeon: Valarie Merino, MD;  Location: [facility];  Service: General;  Laterality: N/A;   COLONOSCOPY     COLONOSCOPY  05/27/2022   CORONARY ANGIOPLASTY WITH STENT PLACEMENT  04/30/2007   2.5 mm Promus stent that was to 95% Circ;had 60-70% lesion to RCA   DOPPLER ECHOCARDIOGRAPHY  05/27/2002   [facility]normal EF 55-66%,   FOOT SURGERY Right    x2 fascia   HEEL SPUR SURGERY Right    HEMORRHOID SURGERY     Holter Monitor  04/07/2007   sinus tachy.;   JOINT REPLACEMENT N/A    Phreesia 07/29/2020   KNEE ARTHROSCOPY  03/27/2012   Procedure: ARTHROSCOPY KNEE;  Surgeon: Loanne Drilling, MD;  Location: [facility];  Service: Orthopedics;  Laterality: Left;   NM MYOCAR PERF WALL MOTION  12/19/2011   EXERCISED FOR 8-1/2 MINUTES RECHING 10 METABOLIC EQUIVALENTS. NO EVIDENCE  OF ISCHEMIA OR INFARCTION . EF 71%   POLYPECTOMY     renal dopplers  04/08/2007   relatively normal   TOTAL HIP ARTHROPLASTY Right 03/16/2018   Procedure: RIGHT TOTAL HIP ARTHROPLASTY ANTERIOR APPROACH;  Surgeon: Ollen Gross, MD;  Location: [facility];  Service: Orthopedics;  Laterality: Right;   TOTAL HIP ARTHROPLASTY Left about 10 to 12 years ago   TOTAL KNEE ARTHROPLASTY Left 09/04/2012   Procedure: TOTAL KNEE ARTHROPLASTY;  Surgeon: Loanne Drilling, MD;  Location: [facility];  Service: Orthopedics;  Laterality: Left;   TOTAL KNEE ARTHROPLASTY Right 01/24/2020   Procedure: TOTAL KNEE ARTHROPLASTY;  Surgeon: Ollen Gross, MD;  Location: [facility];  Service: Orthopedics;  Laterality: Right;     Social History   Socioeconomic History   Marital status: Married    Spouse name: Not on file   Number of children: 1   Years of education: Not on file    Highest education level: 12th grade  Occupational History    Employer: [employer]  Tobacco Use   Smoking status: Never    Passive exposure: Past (PARENTS SMOKED)   Smokeless tobacco: Never  Vaping Use   Vaping status: Never Used  Substance and Sexual Activity   Alcohol use: No   Drug use: No   Sexual activity: Not on file  Other Topics Concern   Not on file  Social History Narrative   Very little 0-2 drinks a week   Social Determinants of Health   Financial Resource Strain: Patient Declined (05/22/2023)   Overall Financial Resource Strain (CARDIA)    Difficulty of Paying Living Expenses: Patient declined  Food Insecurity: No Food Insecurity (05/22/2023)   Hunger Vital Sign    Worried About Running Out of Food in the Last Year: Never true  Ran Out of Food in the Last Year: Never true  Transportation Needs: No Transportation Needs (05/22/2023)   PRAPARE - Administrator, Civil Service (Medical): No    Lack of Transportation (Non-Medical): No  Physical Activity: Unknown (05/22/2023)   Exercise Vital Sign    Days of Exercise per Week: Patient declined    Minutes of Exercise per Session: 0 min  Stress: No Stress Concern Present (05/22/2023)   [employer] of Occupational Health - Occupational Stress Questionnaire    Feeling of Stress : Only a little  Social Connections: Socially Integrated (05/22/2023)   Social Connection and Isolation Panel [NHANES]    Frequency of Communication with Friends and Family: More than three times a week    Frequency of Social Gatherings with Friends and Family: More than three times a week    Attends Religious Services: More than 4 times per year    Active Member of [employer] or Organizations: Yes    Attends Engineer, structural: More than 4 times per year    Marital Status: Married  Catering manager Violence: Not At Risk (02/11/2023)   Humiliation, Afraid, Rape, and Kick questionnaire    Fear of Current or  Ex-Partner: No    Emotionally Abused: No    Physically Abused: No    Sexually Abused: No    Family History  Problem Relation Age of Onset   Breast cancer Mother    Diabetes Mother    Prostate cancer Father    Colon cancer Neg Hx    Colon polyps Neg Hx    Esophageal cancer Neg Hx    Rectal cancer Neg Hx    Stomach cancer Neg Hx    Ulcerative colitis Neg Hx    Crohn's disease Neg Hx      Review of Systems  Constitutional: Negative.  Negative for chills and fever.  HENT:  Positive for congestion.   Respiratory:  Positive for cough and sputum production. Negative for shortness of breath and wheezing.   Cardiovascular: Negative.  Negative for chest pain and palpitations.  Gastrointestinal:  Negative for abdominal pain, diarrhea, nausea and vomiting.  Genitourinary: Negative.  Negative for dysuria and hematuria.  Skin: Negative.  Negative for rash.  Neurological: Negative.  Negative for dizziness and headaches.  All other systems reviewed and are negative.   Vitals:   06/18/23 1417  BP: 124/72  Pulse: 84  Temp: 98.1 F (36.7 C)  SpO2: 94%    Physical Exam Vitals reviewed.  Constitutional:      Appearance: Normal appearance.  HENT:     Head: Normocephalic.     Mouth/Throat:     Mouth: Mucous membranes are moist.     Pharynx: Oropharynx is clear.  Eyes:     Extraocular Movements: Extraocular movements intact.     Conjunctiva/sclera: Conjunctivae normal.     Pupils: Pupils are equal, round, and reactive to light.  Cardiovascular:     Rate and Rhythm: Normal rate and regular rhythm.     Pulses: Normal pulses.     Heart sounds: Normal heart sounds.  Pulmonary:     Effort: Pulmonary effort is normal.     Breath sounds: Normal breath sounds.  Musculoskeletal:     Cervical back: No tenderness.  Lymphadenopathy:     Cervical: No cervical adenopathy.  Skin:    General: Skin is warm and dry.  Neurological:     Mental Status: He is alert and oriented to person,  place, and time.  Psychiatric:        Mood and Affect: Mood normal.        Behavior: Behavior normal.      ASSESSMENT & PLAN: A total of 34 minutes was spent with the patient and counseling/coordination of care regarding preparing for this visit, review of most recent office visit notes, review of chronic medical conditions under management, review of all medications, diagnosis of lower respiratory infection and need for antibiotics, review of chest x-ray done today, symptom management, prognosis, documentation and need for follow-up if no better or worse during the next several days.  Problem List Items Addressed This Visit       Cardiovascular and Mediastinum   Essential hypertension (Chronic)    Well-controlled hypertension with normal blood pressure readings at home. Continue losartan 100 mg daily, hydrochlorothiazide 25 mg daily and metoprolol succinate 50 mg daily Cardiovascular risks associated with hypertension discussed Dietary approaches to stop hypertension discussed        Respiratory   Lower respiratory infection - Primary    Unresponsive to Augmentin Clinically stable.  Chest x-ray done today Report reviewed Recommend daily azithromycin for 5 days Advised to rest and stay well-hydrated ED precautions given Advised to contact the office if no better or worse during the next several days      Relevant Medications   predniSONE (DELTASONE) 20 MG tablet   azithromycin (ZITHROMAX) 250 MG tablet   Other Relevant Orders   DG Chest 2 View     Other   Dyslipidemia    Diet and nutrition discussed Intolerant to statins      Acute cough    Cough management discussed Advised to continue over-the-counter Mucinex DM and cough drops Will benefit from prednisone 40 mg daily for 5 days Tessalon 200 mg 3 times a day Hycodan syrup at bedtime Advised to rest and stay well-hydrated      Relevant Medications   predniSONE (DELTASONE) 20 MG tablet   benzonatate (TESSALON)  200 MG capsule   HYDROcodone bit-homatropine (HYCODAN) 5-1.5 MG/5ML syrup   Other Relevant Orders   DG Chest 2 View   Patient Instructions  Acute Bronchitis, Adult  Acute bronchitis is when air tubes in the lungs (bronchi) suddenly get swollen. The condition can make it hard for you to breathe. In adults, acute bronchitis usually goes away within 2 weeks. A cough caused by bronchitis may last up to 3 weeks. Smoking, allergies, and asthma can make the condition worse. What are the causes? Germs that cause cold and flu (viruses). The most common cause of this condition is the virus that causes the common cold. Bacteria. Substances that bother (irritate) the lungs, including: Smoke from cigarettes and other types of tobacco. Dust and pollen. Fumes from chemicals, gases, or burned fuel. Indoor or outdoor air pollution. What increases the risk? A weak body's defense system. This is also called the immune system. Any condition that affects your lungs and breathing, such as asthma. What are the signs or symptoms? A cough. Coughing up clear, yellow, or green mucus. Making high-pitched whistling sounds when you breathe, most often when you breathe out (wheezing). Runny or stuffy nose. Having too much mucus in your lungs (chest congestion). Shortness of breath. Body aches. A sore throat. How is this treated? Acute bronchitis may go away over time without treatment. Your doctor may tell you to: Drink more fluids. This will help thin your mucus so it is easier to cough up. Use a device that gets medicine into your  lungs (inhaler). Use a vaporizer or a humidifier. These are machines that add water to the air. This helps with coughing and poor breathing. Take a medicine that thins mucus and helps clear it from your lungs. Take a medicine that prevents or stops coughing. It is not common to take an antibiotic medicine for this condition. Follow these instructions at home:  Take  over-the-counter and prescription medicines only as told by your doctor. Use an inhaler, vaporizer, or humidifier as told by your doctor. Take two teaspoons (10 mL) of honey at bedtime. This helps lessen your coughing at night. Drink enough fluid to keep your pee (urine) pale yellow. Do not smoke or use any products that contain nicotine or tobacco. If you need help quitting, ask your doctor. Get a lot of rest. Return to your normal activities when your doctor says that it is safe. Keep all follow-up visits. How is this prevented?  Wash your hands often with soap and water for at least 20 seconds. If you cannot use soap and water, use hand sanitizer. Avoid contact with people who have cold symptoms. Try not to touch your mouth, nose, or eyes with your hands. Avoid breathing in smoke or chemical fumes. Make sure to get the flu shot every year. Contact a doctor if: Your symptoms do not get better in 2 weeks. You have trouble coughing up the mucus. Your cough keeps you awake at night. You have a fever. Get help right away if: You cough up blood. You have chest pain. You have very bad shortness of breath. You faint or keep feeling like you are going to faint. You have a very bad headache. Your fever or chills get worse. These symptoms may be an emergency. Get help right away. Call your local emergency services (911 in the [place] Do not wait to see if the symptoms will go away. Do not drive yourself to the hospital. Summary Acute bronchitis is when air tubes in the lungs (bronchi) suddenly get swollen. In adults, acute bronchitis usually goes away within 2 weeks. Drink more fluids. This will help thin your mucus so it is easier to cough up. Take over-the-counter and prescription medicines only as told by your doctor. Contact a doctor if your symptoms do not improve after 2 weeks of treatment. This information is not intended to replace advice given to you by your health care provider.  Make sure you discuss any questions you have with your health care provider. Document Revised: 11/15/2020 Document Reviewed: 11/15/2020 Elsevier Patient Education  2024 [employer]    Edwina Barth, MD [facility]

## 2023-06-18 NOTE — Progress Notes (Deleted)
Cardiology Clinic Note   Patient Name: Samuel Chambers Date of Encounter: 06/18/2023  Primary Care Provider:  Georgina Quint, MD Primary Cardiologist:  Thurmon Fair, MD  Patient Profile    ***  Past Medical History    Past Medical History:  Diagnosis Date   Adenomatous colon polyp 03/2005   Anxiety    CAD S/P percutaneous coronary angioplasty 2008   PCI to circumflex with a Promus DES 2.5 mm x 8 mm   Cataract    BILATERAL,REMOVED   Degenerative joint disease    Diabetes mellitus without complication (HCC)    diet controlled,PT.DENIES UPDATED 04/10/22   Diverticulosis    Fatty liver 2016   pt denies   Fibromyalgia    GERD (gastroesophageal reflux disease)    Heart murmur    Phreesia 07/29/2020   Hyperlipidemia    statin intolerant   Hypertension    Internal hemorrhoids    Obesity, Class II, BMI 35-39.9    Pneumonia 2016   PONV (postoperative nausea and vomiting)    Past Surgical History:  Procedure Laterality Date   CHOLECYSTECTOMY N/A 02/18/2013   Procedure: LAPAROSCOPIC CHOLECYSTECTOMY WITH INTRAOPERATIVE CHOLANGIOGRAM;  Surgeon: Valarie Merino, MD;  Location: [facility];  Service: General;  Laterality: N/A;   COLONOSCOPY     COLONOSCOPY  05/27/2022   CORONARY ANGIOPLASTY WITH STENT PLACEMENT  04/30/2007   2.5 mm Promus stent that was to 95% Circ;had 60-70% lesion to RCA   DOPPLER ECHOCARDIOGRAPHY  05/27/2002   [facility]normal EF 55-66%,   FOOT SURGERY Right    x2 fascia   HEEL SPUR SURGERY Right    HEMORRHOID SURGERY     Holter Monitor  04/07/2007   sinus tachy.;   JOINT REPLACEMENT N/A    Phreesia 07/29/2020   KNEE ARTHROSCOPY  03/27/2012   Procedure: ARTHROSCOPY KNEE;  Surgeon: Loanne Drilling, MD;  Location: [facility];  Service: Orthopedics;  Laterality: Left;   NM MYOCAR PERF WALL MOTION  12/19/2011   EXERCISED FOR 8-1/2 MINUTES RECHING 10 METABOLIC EQUIVALENTS. NO EVIDENCE  OF ISCHEMIA OR INFARCTION . EF 71%   POLYPECTOMY     renal  dopplers  04/08/2007   relatively normal   TOTAL HIP ARTHROPLASTY Right 03/16/2018   Procedure: RIGHT TOTAL HIP ARTHROPLASTY ANTERIOR APPROACH;  Surgeon: Ollen Gross, MD;  Location: [facility];  Service: Orthopedics;  Laterality: Right;   TOTAL HIP ARTHROPLASTY Left about 10 to 12 years ago   TOTAL KNEE ARTHROPLASTY Left 09/04/2012   Procedure: TOTAL KNEE ARTHROPLASTY;  Surgeon: Loanne Drilling, MD;  Location: [facility];  Service: Orthopedics;  Laterality: Left;   TOTAL KNEE ARTHROPLASTY Right 01/24/2020   Procedure: TOTAL KNEE ARTHROPLASTY;  Surgeon: Ollen Gross, MD;  Location: [facility];  Service: Orthopedics;  Laterality: Right;     Allergies  Allergies  Allergen Reactions   Amlodipine Swelling    Patient had severe swelling   Crestor [Rosuvastatin] Other (See Comments)    myalgias   Valsartan Other (See Comments)    Made patient feel tired and no energy   Warfarin And Related Other (See Comments)    Fast heart beat, went down hill, felt like he was dying    History of Present Illness    ***  Home Medications    Prior to Admission medications   Medication Sig Start Date End Date Taking? Authorizing Provider  albuterol (VENTOLIN HFA) 108 (90 Base) MCG/ACT inhaler Inhale 2 puffs into the lungs every 6 (six)  hours as needed for wheezing or shortness of breath. 06/02/23   Gwenlyn Fudge, FNP  aspirin EC 81 MG tablet Take 81 mg by mouth daily. Swallow whole.    [provider]  busPIRone (BUSPAR) 7.5 MG tablet TAKE 1 TABLET BY MOUTH 2 TIMES DAILY. 06/05/23   Georgina Quint, MD  DULoxetine (CYMBALTA) 60 MG capsule TAKE 1 CAPSULE BY MOUTH EVERY DAY 02/12/23   Georgina Quint, MD  hydrochlorothiazide (HYDRODIURIL) 25 MG tablet Take 1 tablet (25 mg total) by mouth daily. 05/26/23   Georgina Quint, MD  losartan (COZAAR) 100 MG tablet Take 1 tablet (100 mg total) by mouth daily. 05/26/23   Georgina Quint, MD  metoprolol succinate (TOPROL-XL) 50 MG  24 hr tablet Take 1 tablet (50 mg total) by mouth daily. 05/26/23   Georgina Quint, MD  ofloxacin (OCUFLOX) 0.3 % ophthalmic solution  05/22/23   [provider]  prednisoLONE acetate (PRED FORTE) 1 % ophthalmic suspension SMARTSIG:In Eye(s) 05/22/23   [provider]  predniSONE (STERAPRED UNI-PAK 21 TAB) 10 MG (21) TBPK tablet As directed x 6 days 06/02/23   Gwenlyn Fudge, FNP  promethazine-dextromethorphan (PROMETHAZINE-DM) 6.25-15 MG/5ML syrup Take 5 mLs by mouth 4 (four) times daily as needed for cough. 06/02/23   Gwenlyn Fudge, FNP    Family History    Family History  Problem Relation Age of Onset   Breast cancer Mother    Diabetes Mother    Prostate cancer Father    Colon cancer Neg Hx    Colon polyps Neg Hx    Esophageal cancer Neg Hx    Rectal cancer Neg Hx    Stomach cancer Neg Hx    Ulcerative colitis Neg Hx    Crohn's disease Neg Hx    He indicated that his mother is deceased. He indicated that his father is alive. He indicated that his maternal grandmother is deceased. He indicated that his maternal grandfather is deceased. He indicated that his paternal grandmother is deceased. He indicated that his paternal grandfather is deceased. He indicated that the status of his neg hx is unknown.  Social History    Social History   Socioeconomic History   Marital status: Married    Spouse name: Not on file   Number of children: 1   Years of education: Not on file   Highest education level: 12th grade  Occupational History    Employer: [employer]  Tobacco Use   Smoking status: Never    Passive exposure: Past (PARENTS SMOKED)   Smokeless tobacco: Never  Vaping Use   Vaping status: Never Used  Substance and Sexual Activity   Alcohol use: No   Drug use: No   Sexual activity: Not on file  Other Topics Concern   Not on file  Social History Narrative   Very little 0-2 drinks a week   Social Determinants of Health   Financial Resource  Strain: Patient Declined (05/22/2023)   Overall Financial Resource Strain (CARDIA)    Difficulty of Paying Living Expenses: Patient declined  Food Insecurity: No Food Insecurity (05/22/2023)   Hunger Vital Sign    Worried About Running Out of Food in the Last Year: Never true    Ran Out of Food in the Last Year: Never true  Transportation Needs: No Transportation Needs (05/22/2023)   PRAPARE - Administrator, Civil Service (Medical): No    Lack of Transportation (Non-Medical): No  Physical Activity: Unknown (  05/22/2023)   Exercise Vital Sign    Days of Exercise per Week: Patient declined    Minutes of Exercise per Session: 0 min  Stress: No Stress Concern Present (05/22/2023)   [employer] of Occupational Health - Occupational Stress Questionnaire    Feeling of Stress : Only a little  Social Connections: Socially Integrated (05/22/2023)   Social Connection and Isolation Panel [NHANES]    Frequency of Communication with Friends and Family: More than three times a week    Frequency of Social Gatherings with Friends and Family: More than three times a week    Attends Religious Services: More than 4 times per year    Active Member of [employer] or Organizations: Yes    Attends Engineer, structural: More than 4 times per year    Marital Status: Married  Catering manager Violence: Not At Risk (02/11/2023)   Humiliation, Afraid, Rape, and Kick questionnaire    Fear of Current or Ex-Partner: No    Emotionally Abused: No    Physically Abused: No    Sexually Abused: No     Review of Systems    General:  No chills, fever, night sweats or weight changes.  Cardiovascular:  No chest pain, dyspnea on exertion, edema, orthopnea, palpitations, paroxysmal nocturnal dyspnea. Dermatological: No rash, lesions/masses Respiratory: No cough, dyspnea Urologic: No hematuria, dysuria Abdominal:   No nausea, vomiting, diarrhea, bright red blood per rectum, melena, or  hematemesis Neurologic:  No visual changes, wkns, changes in mental status. All other systems reviewed and are otherwise negative except as noted above.  Physical Exam    VS:  There were no vitals taken for this visit. , BMI There is no height or weight on file to calculate BMI. GEN: Well nourished, well developed, in no acute distress. HEENT: normal. Neck: Supple, no JVD, carotid bruits, or masses. Cardiac: RRR, no murmurs, rubs, or gallops. No clubbing, cyanosis, edema.  Radials/DP/PT 2+ and equal bilaterally.  Respiratory:  Respirations regular and unlabored, clear to auscultation bilaterally. GI: Soft, nontender, nondistended, BS + x 4. MS: no deformity or atrophy. Skin: warm and dry, no rash. Neuro:  Strength and sensation are intact. Psych: Normal affect.  Accessory Clinical Findings    Recent Labs: 02/11/2023: ALT 31; Hemoglobin 13.2; Platelets 205; TSH 5.120 04/11/2023: BUN 13; Creatinine, Ser 1.03; Potassium 4.0; Sodium 141   Recent Lipid Panel    Component Value Date/Time   CHOL 172 02/11/2023 0420   CHOL 177 06/20/2021 0841   TRIG 173 (H) 02/11/2023 0420   HDL 32 (L) 02/11/2023 0420   HDL 44 06/20/2021 0841   CHOLHDL 5.4 02/11/2023 0420   VLDL 35 02/11/2023 0420   LDLCALC 105 (H) 02/11/2023 0420   LDLCALC 117 (H) 06/20/2021 0841    No BP recorded.  {Refresh Note OR Click here to enter BP  :1}***    ECG personally reviewed by me today- ***          Assessment & Plan   1.  ***   Thomasene Ripple. Leily Capek NP-C     06/18/2023, 8:14 AM [facility] [address] Office [phone] Fax [phone]    I spent***minutes examining this patient, reviewing medications, and using patient centered shared decision making involving her cardiac care.   I spent greater than 20 minutes reviewing her past medical history,  medications, and prior cardiac tests.

## 2023-06-18 NOTE — Assessment & Plan Note (Signed)
Diet and nutrition discussed Intolerant to statins

## 2023-06-18 NOTE — Assessment & Plan Note (Signed)
Unresponsive to Augmentin Clinically stable.  Chest x-ray done today Report reviewed Recommend daily azithromycin for 5 days Advised to rest and stay well-hydrated ED precautions given Advised to contact the office if no better or worse during the next several days

## 2023-06-23 ENCOUNTER — Ambulatory Visit: Payer: Medicare Other | Admitting: General Practice

## 2023-07-03 ENCOUNTER — Telehealth: Payer: Self-pay | Admitting: Emergency Medicine

## 2023-07-03 NOTE — Telephone Encounter (Signed)
Schedule pt for Tuesday @ 2pm. Wife may call back to reschedule

## 2023-07-03 NOTE — Telephone Encounter (Signed)
Patient's wife called and said he has been sick for over a month and has seen both Deliah Boston and Dr. Alvy Bimler. His wife said he is not getting better. They would like to know what Dr. Alvy Bimler would like for him to do or if he could prescribe something different. Best callback for Larita Fife is [phone].

## 2023-07-03 NOTE — Telephone Encounter (Signed)
Last office visit 06/18/2023.  This is behaving like a viral illness which has to continue running its course.  However if he is not getting better or getting worse, he needs to be seen and evaluated again in the office.  He always has the option of going to the emergency department depending on how bad he feels.  Thanks.

## 2023-07-08 ENCOUNTER — Encounter: Payer: Self-pay | Admitting: Emergency Medicine

## 2023-07-08 ENCOUNTER — Ambulatory Visit (INDEPENDENT_AMBULATORY_CARE_PROVIDER_SITE_OTHER): Payer: Medicare Other | Admitting: Emergency Medicine

## 2023-07-08 ENCOUNTER — Other Ambulatory Visit: Payer: Self-pay | Admitting: Emergency Medicine

## 2023-07-08 VITALS — BP 124/74 | HR 90 | Temp 98.5°F | Ht 72.0 in | Wt 269.6 lb

## 2023-07-08 DIAGNOSIS — R062 Wheezing: Secondary | ICD-10-CM | POA: Diagnosis not present

## 2023-07-08 DIAGNOSIS — R053 Chronic cough: Secondary | ICD-10-CM | POA: Insufficient documentation

## 2023-07-08 DIAGNOSIS — J22 Unspecified acute lower respiratory infection: Secondary | ICD-10-CM

## 2023-07-08 LAB — COMPREHENSIVE METABOLIC PANEL
ALT: 32 U/L (ref 0–53)
AST: 28 U/L (ref 0–37)
Albumin: 4.1 g/dL (ref 3.5–5.2)
Alkaline Phosphatase: 49 U/L (ref 39–117)
BUN: 13 mg/dL (ref 6–23)
CO2: 31 meq/L (ref 19–32)
Calcium: 9.2 mg/dL (ref 8.4–10.5)
Chloride: 101 meq/L (ref 96–112)
Creatinine, Ser: 0.88 mg/dL (ref 0.40–1.50)
GFR: 86 mL/min (ref >60.00)
Glucose, Bld: 147 mg/dL — ABNORMAL HIGH (ref 70–99)
Potassium: 3.7 meq/L (ref 3.5–5.1)
Sodium: 139 meq/L (ref 135–145)
Total Bilirubin: 0.5 mg/dL (ref 0.2–1.2)
Total Protein: 6.8 g/dL (ref 6.0–8.3)

## 2023-07-08 LAB — CBC WITH DIFFERENTIAL/PLATELET
Basophils Absolute: 0.1 10*3/uL (ref 0.0–0.1)
Basophils Relative: 0.7 % (ref 0.0–3.0)
Eosinophils Absolute: 0.3 10*3/uL (ref 0.0–0.7)
Eosinophils Relative: 3.9 % (ref 0.0–5.0)
HCT: 41.6 % (ref 39.0–52.0)
Hemoglobin: 14.1 g/dL (ref 13.0–17.0)
Lymphocytes Relative: 28.9 % (ref 12.0–46.0)
Lymphs Abs: 2.4 10*3/uL (ref 0.7–4.0)
MCHC: 33.8 g/dL (ref 30.0–36.0)
MCV: 86.4 fL (ref 78.0–100.0)
Monocytes Absolute: 0.7 10*3/uL (ref 0.1–1.0)
Monocytes Relative: 8.8 % (ref 3.0–12.0)
Neutro Abs: 4.9 10*3/uL (ref 1.4–7.7)
Neutrophils Relative %: 57.7 % (ref 43.0–77.0)
Platelets: 241 10*3/uL (ref 150.0–400.0)
RBC: 4.81 Mil/uL (ref 4.22–5.81)
RDW: 15.1 % (ref 11.5–15.5)
WBC: 8.5 10*3/uL (ref 4.0–10.5)

## 2023-07-08 MED ORDER — LEVOFLOXACIN 750 MG PO TABS: 750.0000 mg | ORAL_TABLET | Freq: Every day | ORAL | 0 refills | Status: AC

## 2023-07-08 MED ORDER — BUDESONIDE-FORMOTEROL FUMARATE 160-4.5 MCG/ACT IN AERO: 2.0000 | INHALATION_SPRAY | Freq: Two times a day (BID) | RESPIRATORY_TRACT | 3 refills | Status: DC

## 2023-07-08 MED ORDER — HYDROCODONE BIT-HOMATROP MBR 5-1.5 MG/5ML PO SOLN: 5.0000 mL | Freq: Every evening | ORAL | 0 refills | Status: DC | PRN

## 2023-07-08 MED ORDER — BENZONATATE 200 MG PO CAPS
200.0000 mg | ORAL_CAPSULE | Freq: Two times a day (BID) | ORAL | 0 refills | Status: DC | PRN
Start: 2023-07-08 — End: 2023-12-18

## 2023-07-08 MED ORDER — METHYLPREDNISOLONE 4 MG PO TBPK: ORAL_TABLET | ORAL | 1 refills | Status: DC

## 2023-07-08 NOTE — Assessment & Plan Note (Signed)
Intermittent bronchospasm present. Recommend new course of systemic corticosteroids with Medrol Dosepak Also recommend daily use of Symbicort twice a day

## 2023-07-08 NOTE — Progress Notes (Addendum)
Samuel Chambers 72 y.o.   Chief Complaint  Patient presents with  . Cough    Patient states sinus congestion, coughing up some brown, greenish yellow mucus. Pressure under right eye. Still having same symptoms but feels worse w/SOB. Patient states its gets a little better but once the meds are out he get worse again. Has been taking otc meds to help but.       HISTORY OF PRESENT ILLNESS: This is a 72 y.o. male complaining of persistent cough for the past 7 to 8 weeks Intermittent wheezing. Seen by me on 06/18/2023. Has failed both Augmentin and azithromycin Got a little better with Tessalon and Hycodan Symptoms coming back.  Still has productive cough and nasal discharge No new symptoms just persistent ones. No other complaints or medical concerns today.  Cough Associated symptoms include wheezing. Pertinent negatives include no chest pain, headaches, hemoptysis, rash or shortness of breath.     Prior to Admission medications   Medication Sig Start Date End Date Taking? Authorizing Provider  albuterol (VENTOLIN HFA) 108 (90 Base) MCG/ACT inhaler Inhale 2 puffs into the lungs every 6 (six) hours as needed for wheezing or shortness of breath. 06/02/23  Yes Deliah Boston F, FNP  aspirin EC 81 MG tablet Take 81 mg by mouth daily. Swallow whole.   Yes [provider]  benzonatate (TESSALON) 200 MG capsule Take 1 capsule (200 mg total) by mouth 2 (two) times daily as needed for cough. 07/08/23  Yes Cordera Stineman, Eilleen Kempf, MD  budesonide-formoterol [facility]) 160-4.5 MCG/ACT inhaler Inhale 2 puffs into the lungs 2 (two) times daily. 07/08/23  Yes Georgina Quint, MD  DULoxetine (CYMBALTA) 60 MG capsule TAKE 1 CAPSULE BY MOUTH EVERY DAY 02/12/23  Yes Jennefer Kopp, Eilleen Kempf, MD  hydrochlorothiazide (HYDRODIURIL) 25 MG tablet Take 1 tablet (25 mg total) by mouth daily. 05/26/23  Yes Nathan Stallworth, Eilleen Kempf, MD  HYDROcodone bit-homatropine [facility]) 5-1.5 MG/5ML syrup Take 5 mLs  by mouth at bedtime as needed for cough. 07/08/23  Yes Rishon Thilges, Eilleen Kempf, MD  levofloxacin (LEVAQUIN) 750 MG tablet Take 1 tablet (750 mg total) by mouth daily for 7 days. 07/08/23 07/15/23 Yes Pang Robers, Eilleen Kempf, MD  losartan (COZAAR) 100 MG tablet Take 1 tablet (100 mg total) by mouth daily. 05/26/23  Yes Georgina Quint, MD  methylPREDNISolone (MEDROL DOSEPAK) 4 MG TBPK tablet Sig as indicated 07/08/23  Yes Sophie Quiles, Eilleen Kempf, MD  metoprolol succinate (TOPROL-XL) 50 MG 24 hr tablet Take 1 tablet (50 mg total) by mouth daily. 05/26/23  Yes Berkleigh Beckles, Eilleen Kempf, MD  ofloxacin (OCUFLOX) 0.3 % ophthalmic solution  05/22/23  Yes [provider]  prednisoLONE acetate (PRED FORTE) 1 % ophthalmic suspension SMARTSIG:In Eye(s) 05/22/23  Yes [provider]  azithromycin (ZITHROMAX) 250 MG tablet Sig as indicated Patient not taking: Reported on 07/08/2023 06/18/23   Georgina Quint, MD  benzonatate (TESSALON) 200 MG capsule Take 1 capsule (200 mg total) by mouth 2 (two) times daily as needed for cough. Patient not taking: Reported on 07/08/2023 06/18/23   Georgina Quint, MD  HYDROcodone bit-homatropine [facility]) 5-1.5 MG/5ML syrup Take 5 mLs by mouth at bedtime as needed for cough. Patient not taking: Reported on 07/08/2023 06/18/23   Georgina Quint, MD    Allergies  Allergen Reactions  . Amlodipine Swelling    Patient had severe swelling  . Crestor [Rosuvastatin] Other (See Comments)    myalgias  . Valsartan Other (See Comments)    Made patient  feel tired and no energy  . Warfarin And Related Other (See Comments)    Fast heart beat, went down hill, felt like he was dying    Patient Active Problem List   Diagnosis Date Noted  . Wheezing 07/08/2023  . Persistent cough 07/08/2023  . Lower respiratory infection 06/18/2023  . History of TIA (transient ischemic attack) 05/26/2023  . Statin myopathy 03/11/2023  . Prediabetes 08/15/2022  .  Arthritis of hand 07/27/2022  . Acquired trigger finger of left ring finger 07/27/2022  . Acute cough 04/18/2022  . History of gout 02/07/2021  . Primary osteoarthritis of right knee 01/24/2020  . OA (osteoarthritis) of hip 03/16/2018  . Severe obesity (BMI 35.0-35.9 with comorbidity) (HCC) 08/04/2014  . Dyslipidemia 04/29/2013  . CAD S/P percutaneous coronary angioplasty   . S/P laparoscopic cholecystectomy July 2014 02/18/2013  . OA (osteoarthritis) of knee 09/04/2012  . Situational anxiety 02/24/2007  . Essential hypertension 02/24/2007  . GERD 02/24/2007  . Osteoarthritis of multiple joints 02/24/2007    Past Medical History:  Diagnosis Date  . Adenomatous colon polyp 03/2005  . Anxiety   . CAD S/P percutaneous coronary angioplasty 2008   PCI to circumflex with a Promus DES 2.5 mm x 8 mm  . Cataract    BILATERAL,REMOVED  . Degenerative joint disease   . Diabetes mellitus without complication (HCC)    diet controlled,PT.DENIES UPDATED 04/10/22  . Diverticulosis   . Fatty liver 2016   pt denies  . Fibromyalgia   . GERD (gastroesophageal reflux disease)   . Heart murmur    Phreesia 07/29/2020  . Hyperlipidemia    statin intolerant  . Hypertension   . Internal hemorrhoids   . Obesity, Class II, BMI 35-39.9   . Pneumonia 2016  . PONV (postoperative nausea and vomiting)     Past Surgical History:  Procedure Laterality Date  . CHOLECYSTECTOMY N/A 02/18/2013   Procedure: LAPAROSCOPIC CHOLECYSTECTOMY WITH INTRAOPERATIVE CHOLANGIOGRAM;  Surgeon: Valarie Merino, MD;  Location: [facility];  Service: General;  Laterality: N/A;  . COLONOSCOPY    . COLONOSCOPY  05/27/2022  . CORONARY ANGIOPLASTY WITH STENT PLACEMENT  04/30/2007   2.5 mm Promus stent that was to 95% Circ;had 60-70% lesion to RCA  . DOPPLER ECHOCARDIOGRAPHY  05/27/2002   [facility]normal EF 55-66%,  . FOOT SURGERY Right    x2 fascia  . HEEL SPUR SURGERY Right   . HEMORRHOID SURGERY    . Holter Monitor   04/07/2007   sinus tachy.;  . JOINT REPLACEMENT N/A    Phreesia 07/29/2020  . KNEE ARTHROSCOPY  03/27/2012   Procedure: ARTHROSCOPY KNEE;  Surgeon: Loanne Drilling, MD;  Location: [facility];  Service: Orthopedics;  Laterality: Left;  . NM MYOCAR PERF WALL MOTION  12/19/2011   EXERCISED FOR 8-1/2 MINUTES RECHING 10 METABOLIC EQUIVALENTS. NO EVIDENCE  OF ISCHEMIA OR INFARCTION . EF 71%  . POLYPECTOMY    . renal dopplers  04/08/2007   relatively normal  . TOTAL HIP ARTHROPLASTY Right 03/16/2018   Procedure: RIGHT TOTAL HIP ARTHROPLASTY ANTERIOR APPROACH;  Surgeon: Ollen Gross, MD;  Location: [facility];  Service: Orthopedics;  Laterality: Right;  . TOTAL HIP ARTHROPLASTY Left about 10 to 12 years ago  . TOTAL KNEE ARTHROPLASTY Left 09/04/2012   Procedure: TOTAL KNEE ARTHROPLASTY;  Surgeon: Loanne Drilling, MD;  Location: [facility];  Service: Orthopedics;  Laterality: Left;  . TOTAL KNEE ARTHROPLASTY Right 01/24/2020   Procedure: TOTAL KNEE ARTHROPLASTY;  Surgeon: Ollen Gross,  MD;  Location: [facility];  Service: Orthopedics;  Laterality: Right;     Social History   Socioeconomic History  . Marital status: Married    Spouse name: Not on file  . Number of children: 1  . Years of education: Not on file  . Highest education level: 12th grade  Occupational History    Employer: [employer]  Tobacco Use  . Smoking status: Never    Passive exposure: Past (PARENTS SMOKED)  . Smokeless tobacco: Never  Vaping Use  . Vaping status: Never Used  Substance and Sexual Activity  . Alcohol use: No  . Drug use: No  . Sexual activity: Not on file  Other Topics Concern  . Not on file  Social History Narrative   Very little 0-2 drinks a week   Social Determinants of Health   Financial Resource Strain: Patient Declined (05/22/2023)   Overall Financial Resource Strain (CARDIA)   . Difficulty of Paying Living Expenses: Patient declined  Food Insecurity: No Food Insecurity (05/22/2023)    Hunger Vital Sign   . Worried About Programme researcher, broadcasting/film/video in the Last Year: Never true   . Ran Out of Food in the Last Year: Never true  Transportation Needs: No Transportation Needs (05/22/2023)   PRAPARE - Transportation   . Lack of Transportation (Medical): No   . Lack of Transportation (Non-Medical): No  Physical Activity: Unknown (05/22/2023)   Exercise Vital Sign   . Days of Exercise per Week: Patient declined   . Minutes of Exercise per Session: 0 min  Stress: No Stress Concern Present (05/22/2023)   [employer] of Occupational Health - Occupational Stress Questionnaire   . Feeling of Stress : Only a little  Social Connections: Socially Integrated (05/22/2023)   Social Connection and Isolation Panel [NHANES]   . Frequency of Communication with Friends and Family: More than three times a week   . Frequency of Social Gatherings with Friends and Family: More than three times a week   . Attends Religious Services: More than 4 times per year   . Active Member of Clubs or Organizations: Yes   . Attends Banker Meetings: More than 4 times per year   . Marital Status: Married  Catering manager Violence: Not At Risk (02/11/2023)   Humiliation, Afraid, Rape, and Kick questionnaire   . Fear of Current or Ex-Partner: No   . Emotionally Abused: No   . Physically Abused: No   . Sexually Abused: No    Family History  Problem Relation Age of Onset  . Breast cancer Mother   . Diabetes Mother   . Prostate cancer Father   . Colon cancer Neg Hx   . Colon polyps Neg Hx   . Esophageal cancer Neg Hx   . Rectal cancer Neg Hx   . Stomach cancer Neg Hx   . Ulcerative colitis Neg Hx   . Crohn's disease Neg Hx      Review of Systems  Constitutional: Negative.   HENT:  Positive for congestion.   Respiratory:  Positive for cough, sputum production and wheezing. Negative for hemoptysis and shortness of breath.   Cardiovascular: Negative.  Negative for chest pain and  palpitations.  Gastrointestinal:  Negative for abdominal pain, diarrhea, nausea and vomiting.  Genitourinary: Negative.  Negative for dysuria and hematuria.  Skin: Negative.  Negative for rash.  Neurological: Negative.  Negative for dizziness and headaches.  All other systems reviewed and are negative.   Vitals:  07/08/23 1349  BP: 124/74  Pulse: 90  Temp: 98.5 F (36.9 C)  SpO2: 96%    Physical Exam Vitals reviewed.  Constitutional:      Appearance: Normal appearance.  HENT:     Head: Normocephalic.     Mouth/Throat:     Mouth: Mucous membranes are moist.     Pharynx: Oropharynx is clear.  Eyes:     Extraocular Movements: Extraocular movements intact.     Pupils: Pupils are equal, round, and reactive to light.  Cardiovascular:     Rate and Rhythm: Normal rate and regular rhythm.     Pulses: Normal pulses.     Heart sounds: Murmur heard.  Pulmonary:     Effort: Pulmonary effort is normal.     Breath sounds: Normal breath sounds. No rhonchi or rales.  Musculoskeletal:     Cervical back: No tenderness.  Lymphadenopathy:     Cervical: No cervical adenopathy.  Skin:    General: Skin is warm and dry.     Capillary Refill: Capillary refill takes less than 2 seconds.  Neurological:     General: No focal deficit present.     Mental Status: He is alert and oriented to person, place, and time.  Psychiatric:        Mood and Affect: Mood normal.        Behavior: Behavior normal.     ASSESSMENT & PLAN: A total of 42 minutes was spent with the patient and counseling/coordination of care regarding preparing for this visit, review of most recent office visit notes, review of multiple chronic medical conditions and their management, review of all medications, review of most recent bloodwork results, review of most recent chest x-ray report, diagnosis of lower respiratory infection and management, cough management, ED precautions, prognosis, documentation, and need for follow  up.   Problem List Items Addressed This Visit       Respiratory   Lower respiratory infection - Primary    Persistent infection despite 2 courses of Augmentin and azithromycin. No signs of pneumonia today Chest x-ray from 2 weeks ago reviewed.  No pneumonia or fluid collections. Non-smoker. Still having productive cough.  Acute bronchitis. Most likely viral but may still have secondary bacterial infection.  Recommend Levaquin 750 mg daily for 7 days. ED precautions given.      Relevant Medications   levofloxacin (LEVAQUIN) 750 MG tablet   Other Relevant Orders   CBC with Differential/Platelet   Comprehensive metabolic panel     Other   Wheezing    Intermittent bronchospasm present. Recommend new course of systemic corticosteroids with Medrol Dosepak Also recommend daily use of Symbicort twice a day      Relevant Medications   methylPREDNISolone (MEDROL DOSEPAK) 4 MG TBPK tablet   budesonide-formoterol (SYMBICORT) 160-4.5 MCG/ACT inhaler   Persistent cough    Cough management discussed. Recommend to continue over-the-counter Mucinex DM and cough drops Restart Tessalon 200 mg 3 times a day and Hycodan syrup at bedtime as needed Recommend to stay well-hydrated and rest      Relevant Medications   benzonatate (TESSALON) 200 MG capsule   HYDROcodone bit-homatropine (HYCODAN) 5-1.5 MG/5ML syrup     Patient Instructions  Acute Bronchitis, Adult  Acute bronchitis is when air tubes in the lungs (bronchi) suddenly get swollen. The condition can make it hard for you to breathe. In adults, acute bronchitis usually goes away within 2 weeks. A cough caused by bronchitis may last up to 3 weeks. Smoking, allergies, and  asthma can make the condition worse. What are the causes? Germs that cause cold and flu (viruses). The most common cause of this condition is the virus that causes the common cold. Bacteria. Substances that bother (irritate) the lungs, including: Smoke from  cigarettes and other types of tobacco. Dust and pollen. Fumes from chemicals, gases, or burned fuel. Indoor or outdoor air pollution. What increases the risk? A weak body's defense system. This is also called the immune system. Any condition that affects your lungs and breathing, such as asthma. What are the signs or symptoms? A cough. Coughing up clear, yellow, or green mucus. Making high-pitched whistling sounds when you breathe, most often when you breathe out (wheezing). Runny or stuffy nose. Having too much mucus in your lungs (chest congestion). Shortness of breath. Body aches. A sore throat. How is this treated? Acute bronchitis may go away over time without treatment. Your doctor may tell you to: Drink more fluids. This will help thin your mucus so it is easier to cough up. Use a device that gets medicine into your lungs (inhaler). Use a vaporizer or a humidifier. These are machines that add water to the air. This helps with coughing and poor breathing. Take a medicine that thins mucus and helps clear it from your lungs. Take a medicine that prevents or stops coughing. It is not common to take an antibiotic medicine for this condition. Follow these instructions at home:  Take over-the-counter and prescription medicines only as told by your doctor. Use an inhaler, vaporizer, or humidifier as told by your doctor. Take two teaspoons (10 mL) of honey at bedtime. This helps lessen your coughing at night. Drink enough fluid to keep your pee (urine) pale yellow. Do not smoke or use any products that contain nicotine or tobacco. If you need help quitting, ask your doctor. Get a lot of rest. Return to your normal activities when your doctor says that it is safe. Keep all follow-up visits. How is this prevented?  Wash your hands often with soap and water for at least 20 seconds. If you cannot use soap and water, use hand sanitizer. Avoid contact with people who have cold  symptoms. Try not to touch your mouth, nose, or eyes with your hands. Avoid breathing in smoke or chemical fumes. Make sure to get the flu shot every year. Contact a doctor if: Your symptoms do not get better in 2 weeks. You have trouble coughing up the mucus. Your cough keeps you awake at night. You have a fever. Get help right away if: You cough up blood. You have chest pain. You have very bad shortness of breath. You faint or keep feeling like you are going to faint. You have a very bad headache. Your fever or chills get worse. These symptoms may be an emergency. Get help right away. Call your local emergency services (911 in the [place] Do not wait to see if the symptoms will go away. Do not drive yourself to the hospital. Summary Acute bronchitis is when air tubes in the lungs (bronchi) suddenly get swollen. In adults, acute bronchitis usually goes away within 2 weeks. Drink more fluids. This will help thin your mucus so it is easier to cough up. Take over-the-counter and prescription medicines only as told by your doctor. Contact a doctor if your symptoms do not improve after 2 weeks of treatment. This information is not intended to replace advice given to you by your health care provider. Make sure you discuss any questions  you have with your health care provider. Document Revised: 11/15/2020 Document Reviewed: 11/15/2020 Elsevier Patient Education  2024 Elsevier Inc.      Edwina Barth, MD [facility]

## 2023-07-08 NOTE — Assessment & Plan Note (Signed)
Cough management discussed. Recommend to continue over-the-counter Mucinex DM and cough drops Restart Tessalon 200 mg 3 times a day and Hycodan syrup at bedtime as needed Recommend to stay well-hydrated and rest

## 2023-07-08 NOTE — Patient Instructions (Signed)
Acute Bronchitis, Adult  Acute bronchitis is when air tubes in the lungs (bronchi) suddenly get swollen. The condition can make it hard for you to breathe. In adults, acute bronchitis usually goes away within 2 weeks. A cough caused by bronchitis may last up to 3 weeks. Smoking, allergies, and asthma can make the condition worse. What are the causes? Germs that cause cold and flu (viruses). The most common cause of this condition is the virus that causes the common cold. Bacteria. Substances that bother (irritate) the lungs, including: Smoke from cigarettes and other types of tobacco. Dust and pollen. Fumes from chemicals, gases, or burned fuel. Indoor or outdoor air pollution. What increases the risk? A weak body's defense system. This is also called the immune system. Any condition that affects your lungs and breathing, such as asthma. What are the signs or symptoms? A cough. Coughing up clear, yellow, or green mucus. Making high-pitched whistling sounds when you breathe, most often when you breathe out (wheezing). Runny or stuffy nose. Having too much mucus in your lungs (chest congestion). Shortness of breath. Body aches. A sore throat. How is this treated? Acute bronchitis may go away over time without treatment. Your doctor may tell you to: Drink more fluids. This will help thin your mucus so it is easier to cough up. Use a device that gets medicine into your lungs (inhaler). Use a vaporizer or a humidifier. These are machines that add water to the air. This helps with coughing and poor breathing. Take a medicine that thins mucus and helps clear it from your lungs. Take a medicine that prevents or stops coughing. It is not common to take an antibiotic medicine for this condition. Follow these instructions at home:  Take over-the-counter and prescription medicines only as told by your doctor. Use an inhaler, vaporizer, or humidifier as told by your doctor. Take two teaspoons  (10 mL) of honey at bedtime. This helps lessen your coughing at night. Drink enough fluid to keep your pee (urine) pale yellow. Do not smoke or use any products that contain nicotine or tobacco. If you need help quitting, ask your doctor. Get a lot of rest. Return to your normal activities when your doctor says that it is safe. Keep all follow-up visits. How is this prevented?  Wash your hands often with soap and water for at least 20 seconds. If you cannot use soap and water, use hand sanitizer. Avoid contact with people who have cold symptoms. Try not to touch your mouth, nose, or eyes with your hands. Avoid breathing in smoke or chemical fumes. Make sure to get the flu shot every year. Contact a doctor if: Your symptoms do not get better in 2 weeks. You have trouble coughing up the mucus. Your cough keeps you awake at night. You have a fever. Get help right away if: You cough up blood. You have chest pain. You have very bad shortness of breath. You faint or keep feeling like you are going to faint. You have a very bad headache. Your fever or chills get worse. These symptoms may be an emergency. Get help right away. Call your local emergency services (911 in the [place] Do not wait to see if the symptoms will go away. Do not drive yourself to the hospital. Summary Acute bronchitis is when air tubes in the lungs (bronchi) suddenly get swollen. In adults, acute bronchitis usually goes away within 2 weeks. Drink more fluids. This will help thin your mucus so it is easier  to cough up. Take over-the-counter and prescription medicines only as told by your doctor. Contact a doctor if your symptoms do not improve after 2 weeks of treatment. This information is not intended to replace advice given to you by your health care provider. Make sure you discuss any questions you have with your health care provider. Document Revised: 11/15/2020 Document Reviewed: 11/15/2020 Elsevier Patient  Education  2024 [employer].

## 2023-07-08 NOTE — Assessment & Plan Note (Signed)
Persistent infection despite 2 courses of Augmentin and azithromycin. No signs of pneumonia today Chest x-ray from 2 weeks ago reviewed.  No pneumonia or fluid collections. Non-smoker. Still having productive cough.  Acute bronchitis. Most likely viral but may still have secondary bacterial infection.  Recommend Levaquin 750 mg daily for 7 days. ED precautions given.

## 2023-08-09 ENCOUNTER — Other Ambulatory Visit: Payer: Self-pay | Admitting: Emergency Medicine

## 2023-08-09 DIAGNOSIS — F411 Generalized anxiety disorder: Secondary | ICD-10-CM

## 2023-09-15 ENCOUNTER — Ambulatory Visit (INDEPENDENT_AMBULATORY_CARE_PROVIDER_SITE_OTHER): Payer: Medicare Other | Admitting: Neurology

## 2023-09-15 ENCOUNTER — Encounter: Payer: Self-pay | Admitting: Neurology

## 2023-09-15 VITALS — BP 121/78 | HR 82 | Ht 72.0 in | Wt 262.0 lb

## 2023-09-15 DIAGNOSIS — Z8673 Personal history of transient ischemic attack (TIA), and cerebral infarction without residual deficits: Secondary | ICD-10-CM | POA: Diagnosis not present

## 2023-09-15 NOTE — Progress Notes (Signed)
[facility] [address] [place]. [place] [address] [phone]       OFFICE FOLLOW-UP VISIT NOTE  [name] Date of Birth:  18-Feb-1951 Medical Record Number:  [id number]   Referring MD:  Samuel Chambers  Reason for Referral:  TIA  HPI: Initial visit 03/04/2023 [name] is a 73 year old Caucasian male who is seen today for initial office consultation visit for TIA.  He is accompanied by his wife.  History is obtained from them and review of electronic medical records.  I personally reviewed pertinent available imaging films in PACS.  His past medical history significant for hypertension, coronary artery disease s/p stent, fibromyalgia, hyperlipidemia, gastroesophageal reflux disease and mild obesity.  He presented on 02/10/2023 to the emergency room when his wife noticed that his speech sounded thick with slurring of words and some right facial droop while he and his wife were reading her Bible class.  Patient symptoms lasted only few minutes and by the time EMS arrived he was already improving.  His blood glucose on arrival was found to be 126 mg percent by EMS.  Blood pressure was 154/74.  CT head showed no acute abnormality.  Teleneurology was consulted and MRI scan of the brain was obtained which showed no acute infarct.  MRA of the neck showed no large vessel stenosis or occlusion but MRI of the brain showed moderate stenosis of the left A1 and right M2 branches.  2D echo showed ejection fraction of 60 to 65%.  Urine drug screen was negative.  LDL cholesterol was elevated at 105 mg percent.  Hemoglobin A1c was 6.2.  Patient was started on aspirin and Plavix for 3 weeks followed by Plavix alone and she was already on aspirin prior to admission.  Patient states is done well since then.  He said no recurrent episodes of slurred speech, facial droop or any other TIA or stroke symptoms.  He has stopped aspirin and is now on Plavix alone tolerating well minor bruising.   He does have history of multiple statin intolerance in the past and has been referred to rapid clinic.  Hopefully he will get started on injectable soon.  He has been eating healthy and has lost 10 pounds.  He wants to be more active and exercise.  He is not willing to undergo polysomnogram to be tested for sleep apnea at the present time. Update 09/15/2023 : He returns for follow-up after last visit 6 months ago.  He is accompanied by his wife.  Patient states he is doing well.  He has had no recurrent TIA or stroke symptoms.  He remains on aspirin she is tolerating well without bruising or bleeding.  Patient was seen at the lipid clinic but decided against using Repatha or Praluent injections.  He did have myalgias on statins and feels strongly that he is very sensitive to any new medication and does not even want to try injections even though I explained to him that the side effect profile of injection is quite different from that of statins.  He states his blood pressure under good control.  He has no new complaints.  I also expressed concern that he may have obstructive sleep apnea but he and his wife refusing sleep study as patient states he will never be able to tolerate a mask and sleep ROS:   14 system review of systems is positive for slurred speech, facial droop and all other systems negative  PMH:  Past Medical History:  Diagnosis  Date   Adenomatous colon polyp 03/2005   Anxiety    CAD S/P percutaneous coronary angioplasty 2008   PCI to circumflex with a Promus DES 2.5 mm x 8 mm   Cataract    BILATERAL,REMOVED   Degenerative joint disease    Diabetes mellitus without complication (HCC)    diet controlled,PT.DENIES UPDATED 04/10/22   Diverticulosis    Fatty liver 2016   pt denies   Fibromyalgia    GERD (gastroesophageal reflux disease)    Heart murmur    Phreesia 07/29/2020   Hyperlipidemia    statin intolerant   Hypertension    Internal hemorrhoids    Obesity, Class II, BMI  35-39.9    Pneumonia 2016   PONV (postoperative nausea and vomiting)     Social History:  Social History   Socioeconomic History   Marital status: Married    Spouse name: Not on file   Number of children: 1   Years of education: Not on file   Highest education level: 12th grade  Occupational History    Employer: [employer]  Tobacco Use   Smoking status: Never    Passive exposure: Past (PARENTS SMOKED)   Smokeless tobacco: Never  Vaping Use   Vaping status: Never Used  Substance and Sexual Activity   Alcohol use: No   Drug use: No   Sexual activity: Not on file  Other Topics Concern   Not on file  Social History Narrative   Very little 0-2 drinks a week   Social Drivers of Health   Financial Resource Strain: Patient Declined (05/22/2023)   Overall Financial Resource Strain (CARDIA)    Difficulty of Paying Living Expenses: Patient declined  Food Insecurity: No Food Insecurity (05/22/2023)   Hunger Vital Sign    Worried About Running Out of Food in the Last Year: Never true    Ran Out of Food in the Last Year: Never true  Transportation Needs: No Transportation Needs (05/22/2023)   PRAPARE - Administrator, Civil Service (Medical): No    Lack of Transportation (Non-Medical): No  Physical Activity: Unknown (05/22/2023)   Exercise Vital Sign    Days of Exercise per Week: Patient declined    Minutes of Exercise per Session: 0 min  Stress: No Stress Concern Present (05/22/2023)   [employer] of Occupational Health - Occupational Stress Questionnaire    Feeling of Stress : Only a little  Social Connections: Socially Integrated (05/22/2023)   Social Connection and Isolation Panel [NHANES]    Frequency of Communication with Friends and Family: More than three times a week    Frequency of Social Gatherings with Friends and Family: More than three times a week    Attends Religious Services: More than 4 times per year    Active Member of [employer] or  Organizations: Yes    Attends Engineer, structural: More than 4 times per year    Marital Status: Married  Catering manager Violence: Not At Risk (02/11/2023)   Humiliation, Afraid, Rape, and Kick questionnaire    Fear of Current or Ex-Partner: No    Emotionally Abused: No    Physically Abused: No    Sexually Abused: No    Medications:   Current Outpatient Medications on File Prior to Visit  Medication Sig Dispense Refill   aspirin EC 81 MG tablet Take 81 mg by mouth daily. Swallow whole.     DULoxetine (CYMBALTA) 60 MG capsule TAKE 1 CAPSULE BY MOUTH EVERY DAY 90  capsule 1   hydrochlorothiazide (HYDRODIURIL) 25 MG tablet Take 1 tablet (25 mg total) by mouth daily. 90 tablet 3   losartan (COZAAR) 100 MG tablet Take 1 tablet (100 mg total) by mouth daily. 90 tablet 3   metoprolol succinate (TOPROL-XL) 50 MG 24 hr tablet Take 1 tablet (50 mg total) by mouth daily. 90 tablet 3   albuterol (VENTOLIN HFA) 108 (90 Base) MCG/ACT inhaler Inhale 2 puffs into the lungs every 6 (six) hours as needed for wheezing or shortness of breath. 18 g 0   azithromycin (ZITHROMAX) 250 MG tablet Sig as indicated (Patient not taking: Reported on 07/08/2023) 6 tablet 0   benzonatate (TESSALON) 200 MG capsule Take 1 capsule (200 mg total) by mouth 2 (two) times daily as needed for cough. (Patient not taking: Reported on 07/08/2023) 20 capsule 0   benzonatate (TESSALON) 200 MG capsule Take 1 capsule (200 mg total) by mouth 2 (two) times daily as needed for cough. 20 capsule 0   fluticasone-salmeterol (ADVAIR HFA) 115-21 MCG/ACT inhaler Inhale 2 puffs into the lungs 2 (two) times daily. 1 each 12   HYDROcodone bit-homatropine (HYCODAN) 5-1.5 MG/5ML syrup Take 5 mLs by mouth at bedtime as needed for cough. (Patient not taking: Reported on 07/08/2023) 120 mL 0   HYDROcodone bit-homatropine (HYCODAN) 5-1.5 MG/5ML syrup Take 5 mLs by mouth at bedtime as needed for cough. 180 mL 0   methylPREDNISolone (MEDROL  DOSEPAK) 4 MG TBPK tablet Sig as indicated 21 tablet 1   ofloxacin (OCUFLOX) 0.3 % ophthalmic solution      prednisoLONE acetate (PRED FORTE) 1 % ophthalmic suspension SMARTSIG:In Eye(s)     No current facility-administered medications on file prior to visit.    Allergies:   Allergies  Allergen Reactions   Amlodipine Swelling    Patient had severe swelling   Crestor [Rosuvastatin] Other (See Comments)    myalgias   Valsartan Other (See Comments)    Made patient feel tired and no energy   Warfarin And Related Other (See Comments)    Fast heart beat, went down hill, felt like he was dying    Physical Exam General: Mildly obese middle-aged Caucasian male seated, in no evident distress Head: head normocephalic and atraumatic.   Neck: supple with no carotid or supraclavicular bruits Cardiovascular: regular rate and rhythm, no murmurs Musculoskeletal: no deformity Skin:  no rash/petichiae Vascular:  Normal pulses all extremities  Neurologic Exam Mental Status: Awake and fully alert. Oriented to place and time. Recent and remote memory intact. Attention span, concentration and fund of knowledge appropriate. Mood and affect appropriate.  Cranial Nerves: Fundoscopic exam reveals sharp disc margins. Pupils equal, briskly reactive to light. Extraocular movements full without nystagmus. Visual fields full to confrontation. Hearing diminished bilaterally facial sensation intact. Face, tongue, palate moves normally and symmetrically.  Motor: Normal bulk and tone. Normal strength in all tested extremity muscles. Sensory.: intact to touch , pinprick , position and vibratory sensation.  Coordination: Rapid alternating movements normal in all extremities. Finger-to-nose and heel-to-shin performed accurately bilaterally. Gait and Station: Arises from chair without difficulty. Stance is normal. Gait demonstrates normal stride length and balance . Able to heel, toe and tandem walk with moderate  difficulty.  Reflexes: 1+ and symmetric. Toes downgoing.   NIHSS  0 Modified Rankin  0   ASSESSMENT: 73 year old Caucasian male with transient episode of right facial droop and slurred speech likely due to left hemispheric subcortical/brainstem TIA due to small vessel disease.  Vascular risk factors  of hypertension and hyperlipidemia and obesity and borderline diabetes     PLAN:I had a long d/w patient  and his wife about his recent TIA, risk for recurrent stroke/TIAs, personally independently reviewed imaging studies and stroke evaluation results and answered questions.Continue aspirin 81 mg daily  for secondary stroke prevention and maintain strict control of hypertension with blood pressure goal below 130/90, diabetes with hemoglobin A1c goal below 6.5% and lipids with LDL cholesterol goal below 70 mg/dL. I also advised the patient to eat a healthy diet with plenty of whole grains, cereals, fruits and vegetables, exercise regularly and maintain ideal body weight Patient has had side effects with statins in the past and he and his wife have very strong feelings about trying injectable medications either and hence a declining Repatha/Praluent or Leqvio at this time.  He will return for follow-up in the future only as needed.patient also appears to be at risk for sleep apnea he is refusing to be referred for polysomnogram as he feels he will never be able to tolerate mask and sleep greater than 50% time during this 35-minute   visit was spent on counseling and coordination of care about his TIA and discussion about stroke evaluation, prevention and treatment and answering questions. Delia Heady, MD Note: This document was prepared with digital dictation and possible smart phrase technology. Any transcriptional errors that result from this process are unintentional.

## 2023-09-15 NOTE — Patient Instructions (Signed)
I had a long d/w patient  and his wife about his recent TIA, risk for recurrent stroke/TIAs, personally independently reviewed imaging studies and stroke evaluation results and answered questions.Continue aspirin 81 mg daily  for secondary stroke prevention and maintain strict control of hypertension with blood pressure goal below 130/90, diabetes with hemoglobin A1c goal below 6.5% and lipids with LDL cholesterol goal below 70 mg/dL. I also advised the patient to eat a healthy diet with plenty of whole grains, cereals, fruits and vegetables, exercise regularly and maintain ideal body weight .  Patient has had side effects with statins in the past and he and his wife have very strong feelings about trying injectable medications either and hence a declining Repatha/Praluent or Leqvio at this time.  He will return for follow-up in the future only as needed.

## 2023-11-19 ENCOUNTER — Ambulatory Visit (INDEPENDENT_AMBULATORY_CARE_PROVIDER_SITE_OTHER)

## 2023-11-19 VITALS — Ht 72.0 in | Wt 262.0 lb

## 2023-11-19 DIAGNOSIS — Z0000 Encounter for general adult medical examination without abnormal findings: Secondary | ICD-10-CM

## 2023-11-19 NOTE — Progress Notes (Signed)
 Subjective:   Samuel Chambers is a 73 y.o. who presents for a Medicare Wellness preventive visit.  Visit Complete: Virtual I connected with  Samuel Chambers on 11/19/23 by a video and audio enabled telemedicine application and verified that I am speaking with the correct person using two identifiers.  Patient Location: Home  Provider Location: Home Office  I discussed the limitations of evaluation and management by telemedicine. The patient expressed understanding and agreed to proceed.  Vital Signs: Because this visit was a virtual/telehealth visit, some criteria may be missing or patient reported. Any vitals not documented were not able to be obtained and vitals that have been documented are patient reported.   Persons Participating in Visit: Patient.  AWV Questionnaire: Yes: Patient Medicare AWV questionnaire was completed by the patient on 11/15/2023; I have confirmed that all information answered by patient is correct and no changes since this date.  Cardiac Risk Factors include: advanced age (>65men, >109 women);hypertension;dyslipidemia;male gender;obesity (BMI >30kg/m2)     Objective:    Today's Vitals   11/19/23 0956  Weight: 262 lb (118.8 kg)  Height: 6' (1.829 m)   Body mass index is 35.53 kg/m.     11/19/2023   10:24 AM 02/10/2023    8:04 PM 11/18/2022    8:14 AM 11/12/2021    8:19 AM 08/01/2020   11:05 AM 06/05/2020    8:06 AM 01/24/2020    2:40 PM  Advanced Directives  Does Patient Have a Medical Advance Directive? No No No No No No No  Would patient like information on creating a medical advance directive?  No - Patient declined No - Patient declined No - Patient declined No - Patient declined No - Patient declined No - Patient declined    Current Medications (verified) Outpatient Encounter Medications as of 11/19/2023  Medication Sig   aspirin  EC 81 MG tablet Take 81 mg by mouth daily. Swallow whole.   DULoxetine  (CYMBALTA ) 60 MG capsule TAKE 1 CAPSULE  BY MOUTH EVERY DAY   hydrochlorothiazide  (HYDRODIURIL ) 25 MG tablet Take 1 tablet (25 mg total) by mouth daily.   losartan  (COZAAR ) 100 MG tablet Take 1 tablet (100 mg total) by mouth daily.   metoprolol  succinate (TOPROL -XL) 50 MG 24 hr tablet Take 1 tablet (50 mg total) by mouth daily.   albuterol  (VENTOLIN  HFA) 108 (90 Base) MCG/ACT inhaler Inhale 2 puffs into the lungs every 6 (six) hours as needed for wheezing or shortness of breath.   azithromycin  (ZITHROMAX ) 250 MG tablet Sig as indicated (Patient not taking: Reported on 07/08/2023)   benzonatate  (TESSALON ) 200 MG capsule Take 1 capsule (200 mg total) by mouth 2 (two) times daily as needed for cough. (Patient not taking: Reported on 07/08/2023)   benzonatate  (TESSALON ) 200 MG capsule Take 1 capsule (200 mg total) by mouth 2 (two) times daily as needed for cough.   fluticasone -salmeterol (ADVAIR HFA) 115-21 MCG/ACT inhaler Inhale 2 puffs into the lungs 2 (two) times daily.   HYDROcodone  bit-homatropine (HYCODAN) 5-1.5 MG/5ML syrup Take 5 mLs by mouth at bedtime as needed for cough. (Patient not taking: Reported on 07/08/2023)   HYDROcodone  bit-homatropine (HYCODAN) 5-1.5 MG/5ML syrup Take 5 mLs by mouth at bedtime as needed for cough.   methylPREDNISolone  (MEDROL  DOSEPAK) 4 MG TBPK tablet Sig as indicated   ofloxacin (OCUFLOX) 0.3 % ophthalmic solution    prednisoLONE acetate (PRED FORTE) 1 % ophthalmic suspension SMARTSIG:In Eye(s)   No facility-administered encounter medications on file as of 11/19/2023.  Allergies (verified) Amlodipine , Crestor [rosuvastatin], Valsartan , and Warfarin and related   History: Past Medical History:  Diagnosis Date   Adenomatous colon polyp 03/2005   Anxiety    CAD S/P percutaneous coronary angioplasty 2008   PCI to circumflex with a Promus DES 2.5 mm x 8 mm   Cataract    BILATERAL,REMOVED   Degenerative joint disease    Diabetes mellitus without complication (HCC)    diet controlled,PT.DENIES  UPDATED 04/10/22   Diverticulosis    Fatty liver 2016   pt denies   Fibromyalgia    GERD (gastroesophageal reflux disease)    Heart murmur    [facility]reesia 07/29/2020   Hyperlipidemia    statin intolerant   Hypertension    Internal hemorrhoids    Obesity, Class II, BMI 35-39.9    Pneumonia 2016   PONV (postoperative nausea and vomiting)    Past Surgical History:  Procedure Laterality Date   CHOLECYSTECTOMY N/A 02/18/2013   Procedure: LAPAROSCOPIC CHOLECYSTECTOMY WITH INTRAOPERATIVE CHOLANGIOGRAM;  Surgeon: Azucena Bollard, MD;  Location: [facility];  Service: General;  Laterality: N/A;   COLONOSCOPY     COLONOSCOPY  05/27/2022   CORONARY ANGIOPLASTY WITH STENT PLACEMENT  04/30/2007   2.5 mm Promus stent that was to 95% Circ;had 60-70% lesion to RCA   DOPPLER ECHOCARDIOGRAPHY  05/27/2002   [facility]normal EF 55-66%,   FOOT SURGERY Right    x2 fascia   HEEL SPUR SURGERY Right    HEMORRHOID SURGERY     Holter Monitor  04/07/2007   sinus tachy.;   JOINT REPLACEMENT N/A    [employer] 07/29/2020   KNEE ARTHROSCOPY  03/27/2012   Procedure: ARTHROSCOPY KNEE;  Surgeon: Aurther Blue, MD;  Location: [facility];  Service: Orthopedics;  Laterality: Left;   NM MYOCAR PERF WALL MOTION  12/19/2011   EXERCISED FOR 8-1/2 MINUTES RECHING 10 METABOLIC EQUIVALENTS. NO EVIDENCE  OF ISCHEMIA OR INFARCTION . EF 71%   POLYPECTOMY     renal dopplers  04/08/2007   relatively normal   TOTAL HIP ARTHROPLASTY Right 03/16/2018   Procedure: RIGHT TOTAL HIP ARTHROPLASTY ANTERIOR APPROACH;  Surgeon: Liliane Rei, MD;  Location: [facility];  Service: Orthopedics;  Laterality: Right;   TOTAL HIP ARTHROPLASTY Left about 10 to 12 years ago   TOTAL KNEE ARTHROPLASTY Left 09/04/2012   Procedure: TOTAL KNEE ARTHROPLASTY;  Surgeon: Aurther Blue, MD;  Location: [facility];  Service: Orthopedics;  Laterality: Left;   TOTAL KNEE ARTHROPLASTY Right 01/24/2020   Procedure: TOTAL KNEE ARTHROPLASTY;  Surgeon: Liliane Rei, MD;   Location: [facility];  Service: Orthopedics;  Laterality: Right;    Family History  Problem Relation Age of Onset   Breast cancer Mother    Diabetes Mother    Prostate cancer Father    Colon cancer Neg Hx    Colon polyps Neg Hx    Esophageal cancer Neg Hx    Rectal cancer Neg Hx    Stomach cancer Neg Hx    Ulcerative colitis Neg Hx    Crohn's disease Neg Hx    Social History   Socioeconomic History   Marital status: Married    Spouse name: Tanis Fan   Number of children: 1   Years of education: Not on file   Highest education level: 12th grade  Occupational History    Employer: [employer]   Occupation: RETIRED  Tobacco Use   Smoking status: Never    Passive exposure: Past (PARENTS SMOKED)   Smokeless tobacco: Never  Vaping Use   Vaping status: Never Used  Substance and Sexual Activity   Alcohol use: No   Drug use: No   Sexual activity: Not on file  Other Topics Concern   Not on file  Social History Narrative   Very little 0-2 drinks a week      Lives with wife and 2 dogs   Social Drivers of Corporate investment banker Strain: Low Risk  (11/15/2023)   Overall Financial Resource Strain (CARDIA)    Difficulty of Paying Living Expenses: Not hard at all  Food Insecurity: No Food Insecurity (11/15/2023)   Hunger Vital Sign    Worried About Running Out of Food in the Last Year: Never true    Ran Out of Food in the Last Year: Never true  Transportation Needs: No Transportation Needs (11/15/2023)   PRAPARE - Administrator, Civil Service (Medical): No    Lack of Transportation (Non-Medical): No  Physical Activity: Unknown (11/15/2023)   Exercise Vital Sign    Days of Exercise per Week: Patient declined    Minutes of Exercise per Session: 0 min  Stress: No Stress Concern Present (11/15/2023)   [employer] of Occupational Health - Occupational Stress Questionnaire    Feeling of Stress : Not at all  Social Connections: Socially Integrated (11/15/2023)    Social Connection and Isolation Panel [NHANES]    Frequency of Communication with Friends and Family: More than three times a week    Frequency of Social Gatherings with Friends and Family: More than three times a week    Attends Religious Services: More than 4 times per year    Active Member of [employer] or Organizations: Yes    Attends Banker Meetings: Never    Marital Status: Married    Tobacco Counseling Counseling given: Not Answered    Clinical Intake:  Pre-visit preparation completed: Yes  Pain : No/denies pain     BMI - recorded: 35.53 Nutritional Status: BMI > 30  Obese Nutritional Risks: None Diabetes: No  Lab Results  Component Value Date   HGBA1C 6.2 (H) 02/10/2023   HGBA1C 6.5 08/15/2022   HGBA1C 5.6 06/20/2021     How often do you need to have someone help you when you read instructions, pamphlets, or other written materials from your doctor or pharmacy?: 1 - Never  Interpreter Needed?: No  Information entered by :: Malicia Blasdel, RMA   Activities of Daily Living     11/19/2023    9:57 AM 02/11/2023    2:00 AM  In your present state of health, do you have any difficulty performing the following activities:  Hearing? 1 0  Comment has hearing aides   Vision? 0 0  Difficulty concentrating or making decisions? 0 0  Walking or climbing stairs? 0 0  Dressing or bathing? 0 0  Doing errands, shopping? 0 0  Preparing Food and eating ? N   Using the Toilet? N   In the past six months, have you accidently leaked urine? Y   Do you have problems with loss of bowel control? N   Managing your Medications? N   Managing your Finances? N   Housekeeping or managing your Housekeeping? N     Patient Care Team: Elvira Hammersmith, MD as PCP - General (Internal Medicine) Luana Rumple, MD as PCP - Cardiology (Cardiology) Asencion Blacksmith, MD (Inactive) as Consulting Physician (Gastroenterology) Luana Rumple, MD as Consulting Physician  (Cardiology) Alyce Jubilee, MD  as Referring Physician (Ophthalmology) Ronni Colace, MD as Consulting Physician (Ophthalmology)  Indicate any recent Medical Services you may have received from other than [facility] providers in the past year (date may be approximate).     Assessment:   This is a routine wellness examination for Samuel Chambers.  Hearing/Vision screen Hearing Screening - Comments:: Hearing aides Vision Screening - Comments:: Cataract surgery   Goals Addressed             This Visit's Progress    Patient Stated   On track    Patient stated he is currently working on losing weight, has cut out sweets and stopped soft drinks.        Depression Screen    11/19/2023   10:26 AM 07/08/2023    1:55 PM 06/18/2023    2:21 PM 05/26/2023    9:34 AM 02/17/2023    9:41 AM 11/18/2022    8:13 AM 04/18/2022    8:50 AM  PHQ 2/9 Scores  PHQ - 2 Score 0 1 1 0 0 0 0  PHQ- 9 Score 1          Fall Risk     11/19/2023   10:24 AM 07/08/2023    1:55 PM 06/18/2023    2:21 PM 05/26/2023    9:34 AM 02/17/2023    9:41 AM  Fall Risk   Falls in the past year? 0 0 0 0 0  Number falls in past yr: 0 0 0 0 0  Injury with Fall? 0 0 0 0 0  Risk for fall due to : No Fall Risks No Fall Risks No Fall Risks No Fall Risks No Fall Risks  Follow up Falls prevention discussed;Falls evaluation completed Falls evaluation completed Falls evaluation completed Falls evaluation completed Falls evaluation completed    MEDICARE RISK AT HOME:  Medicare Risk at Home Any stairs in or around the home?: No Home free of loose throw rugs in walkways, pet beds, electrical cords, etc?: Yes Adequate lighting in your home to reduce risk of falls?: Yes Life alert?: No Use of a cane, walker or w/c?: No Grab Chambers in the bathroom?: Yes Shower chair or bench in shower?: No Elevated toilet seat or a handicapped toilet?: Yes  TIMED UP AND GO:  Was the test performed?  No  Cognitive Function: Declined: Patient declined  cognitive screening, but was able to answer questions in an accurate and timely manner. No cognitive impairments observed.        11/18/2022    8:15 AM 08/01/2020   11:02 AM 05/10/2019    8:09 AM 10/14/2017    2:24 PM  6CIT Screen  What Year? 0 points 0 points 0 points 0 points  What month? 0 points 0 points 0 points 0 points  What time? 0 points 0 points 0 points 0 points  Count back from 20 0 points 0 points 0 points 0 points  Months in reverse 0 points 0 points 0 points 0 points  Repeat phrase 0 points 0 points 0 points 6 points  Total Score 0 points 0 points 0 points 6 points    Immunizations Immunization History  Administered Date(s) Administered   Influenza, High Dose Seasonal PF 05/14/2018   Influenza,inj,Quad PF,6+ Mos 09/02/2016   Pneumococcal Conjugate-13 09/02/2016   Tdap 02/13/2014    Screening Tests Health Maintenance  Topic Date Due   Zoster Vaccines- Shingrix (1 of 2) Never done   COVID-19 Vaccine (1 - 2024-25 season) Never done  Pneumonia Vaccine 15+ Years old (2 of 2 - PPSV23) 05/25/2024 (Originally 10/28/2016)   DTaP/Tdap/Td (2 - Td or Tdap) 02/14/2024   INFLUENZA VACCINE  02/27/2024   Medicare Annual Wellness (AWV)  11/18/2024   Colonoscopy  05/27/2025   Hepatitis C Screening  Completed   HPV VACCINES  Aged Out   Meningococcal B Vaccine  Aged Out    Health Maintenance  Health Maintenance Due  Topic Date Due   Zoster Vaccines- Shingrix (1 of 2) Never done   COVID-19 Vaccine (1 - 2024-25 season) Never done   Health Maintenance Items Addressed: See Nurse Notes  Additional Screening:  Vision Screening: Recommended annual ophthalmology exams for early detection of glaucoma and other disorders of the eye.  Dental Screening: Recommended annual dental exams for proper oral hygiene  Community Resource Referral / Chronic Care Management: CRR required this visit?  No   CCM required this visit?  No     Plan:     I have personally reviewed and  noted the following in the patient's chart:   Medical and social history Use of alcohol, tobacco or illicit drugs  Current medications and supplements including opioid prescriptions. Patient is not currently taking opioid prescriptions. Functional ability and status Nutritional status Physical activity Advanced directives List of other physicians Hospitalizations, surgeries, and ER visits in previous 12 months Vitals Screenings to include cognitive, depression, and falls Referrals and appointments  In addition, I have reviewed and discussed with patient certain preventive protocols, quality metrics, and best practice recommendations. A written personalized care plan for preventive services as well as general preventive health recommendations were provided to patient.     Hendy Brindle L Chantelle Verdi, CMA   11/19/2023   After Visit Summary: (MyChart) Due to this being a telephonic visit, the after visit summary with patients personalized plan was offered to patient via MyChart   Notes: Nothing significant to report at this time.

## 2023-11-19 NOTE — Patient Instructions (Signed)
 [name] , Thank you for taking time to come for your Medicare Wellness Visit. I appreciate your ongoing commitment to your health goals. Please review the following plan we discussed and let me know if I can assist you in the future.   Referrals/Orders/Follow-Ups/Clinician Recommendations: It was nice talking with you this morning.  Keep up the good work.  This is a list of the screening recommended for you and due dates:  Health Maintenance  Topic Date Due   Zoster (Shingles) Vaccine (1 of 2) Never done   COVID-19 Vaccine (1 - 2024-25 season) Never done   Pneumonia Vaccine (2 of 2 - PPSV23) 05/25/2024*   DTaP/Tdap/Td vaccine (2 - Td or Tdap) 02/14/2024   Flu Shot  02/27/2024   Medicare Annual Wellness Visit  11/18/2024   Colon Cancer Screening  05/27/2025   Hepatitis C Screening  Completed   HPV Vaccine  Aged Out   Meningitis B Vaccine  Aged Out  *Topic was postponed. The date shown is not the original due date.    Advanced directives: (Declined) Advance directive discussed with you today. Even though you declined this today, please call our office should you change your mind, and we can give you the proper paperwork for you to fill out.  Next Medicare Annual Wellness Visit scheduled for next year: Yes

## 2023-12-15 ENCOUNTER — Telehealth: Payer: Self-pay | Admitting: Cardiovascular Disease

## 2023-12-15 NOTE — Telephone Encounter (Signed)
 Pt called to report that he has been having exertional dyspnea when doing yardwork... it has been happening and worsening the past few weeks... he has also been having "numbness" all over his body but mainly in his left arm. He has not had chest pain but c/o reflux.   I urged him to go to the ED if his symptoms returns and/ or worsen.. he is not having nay symptoms currently... he had a stent 2018.   He agrees but for now I made him a DOD appt this week.

## 2023-12-15 NOTE — Telephone Encounter (Signed)
 Pt c/o Shortness Of Breath: STAT if SOB developed within the last 24 hours or pt is noticeably SOB on the phone  1. Are you currently SOB (can you hear that pt is SOB on the phone)?  No   2. How long have you been experiencing SOB?  Patient says it has been going on for a while, unable to pinpoint exactly how long  3. Are you SOB when sitting or when up moving around?  When up and moving around or exerting himself, more SOB doing yard work etc.  4. Are you currently experiencing any other symptoms?  Left arm numbness, on and off for about a month or so

## 2023-12-18 ENCOUNTER — Encounter: Payer: Self-pay | Admitting: Cardiovascular Disease

## 2023-12-18 ENCOUNTER — Ambulatory Visit: Attending: Cardiovascular Disease | Admitting: Cardiovascular Disease

## 2023-12-18 VITALS — BP 128/90 | HR 81 | Ht 72.0 in | Wt 264.4 lb

## 2023-12-18 DIAGNOSIS — I10 Essential (primary) hypertension: Secondary | ICD-10-CM | POA: Insufficient documentation

## 2023-12-18 DIAGNOSIS — I25118 Atherosclerotic heart disease of native coronary artery with other forms of angina pectoris: Secondary | ICD-10-CM | POA: Diagnosis present

## 2023-12-18 DIAGNOSIS — I350 Nonrheumatic aortic (valve) stenosis: Secondary | ICD-10-CM | POA: Diagnosis present

## 2023-12-18 NOTE — Patient Instructions (Signed)
 Medication Instructions:  No changes *If you need a refill on your cardiac medications before your next appointment, please call your pharmacy*  Lab Work: none   Testing/Procedures: NM Cardiac PET CT - see instructions below  Your physician has requested that you have an echocardiogram. Echocardiography is a painless test that uses sound waves to create images of your heart. It provides your doctor with information about the size and shape of your heart and how well your heart's chambers and valves are working. This procedure takes approximately one hour. There are no restrictions for this procedure. Please do NOT wear cologne, perfume, aftershave, or lotions (deodorant is allowed). Please arrive 15 minutes prior to your appointment time.  Please note: We ask at that you not bring children with you during ultrasound (echo/ vascular) testing. Due to room size and safety concerns, children are not allowed in the ultrasound rooms during exams. Our front office staff cannot provide observation of children in our lobby area while testing is being conducted. An adult accompanying a patient to their appointment will only be allowed in the ultrasound room at the discretion of the ultrasound technician under special circumstances. We apologize for any inconvenience.   Follow-Up: At [facility], you and your health needs are our priority.  As part of our continuing mission to provide you with exceptional heart care, our providers are all part of one team.  This team includes your primary Cardiologist (physician) and Advanced Practice Providers or APPs (Physician Assistants and Nurse Practitioners) who all work together to provide you with the care you need, when you need it.  Your next appointment:   4-6 week(s)  Provider:   Luana Rumple, MD or an Advanced Practice Provider (NP or PA-C)       Please report to Radiology at the [facility] Main Entrance 30 minutes early for  your test.  [address] [place], [place] [address]   How to Prepare for Your Cardiac PET/CT Stress Test:  Nothing to eat or drink, except water , 3 hours prior to arrival time.  NO caffeine/decaffeinated products, or chocolate 12 hours prior to arrival. (Please note decaffeinated beverages (teas/coffees) still contain caffeine).  If you have caffeine within 12 hours prior, the test will need to be rescheduled.  Medication instructions: Do not take erectile dysfunction medications for 72 hours prior to test (sildenafil , tadalafil) Do not take nitrates (isosorbide mononitrate, Ranexa) the day before or day of test  Diabetic Preparation: If able to eat breakfast prior to 3 hour fasting, you may take all medications, including your insulin. Do not worry if you miss your breakfast dose of insulin - start at your next meal. If you do not eat prior to 3 hour fast-Hold all diabetes (oral and insulin) medications. Patients who wear a continuous glucose monitor MUST remove the device prior to scanning.  You may take your remaining medications with water .  NO perfume, cologne or lotion on chest or abdomen area.  Total time is 1 to 2 hours; you may want to bring reading material for the waiting time.  In preparation for your appointment, medication and supplies will be purchased.  Appointment availability is limited, so if you need to cancel or reschedule, please call the Radiology Department Scheduler at [phone] 24 hours in advance to avoid a cancellation fee of $100.00  What to Expect When you Arrive:  Once you arrive and check in for your appointment, you will be taken to a preparation room within  the Radiology Department.  A technologist or Nurse will obtain your medical history, verify that you are correctly prepped for the exam, and explain the procedure.  Afterwards, an IV will be started in your arm and electrodes will be placed on your skin for EKG monitoring during the stress  portion of the exam. Then you will be escorted to the PET/CT scanner.  There, staff will get you positioned on the scanner and obtain a blood pressure and EKG.  During the exam, you will continue to be connected to the EKG and blood pressure machines.  A small, safe amount of a radioactive tracer will be injected in your IV to obtain a series of pictures of your heart along with an injection of a stress agent.    After your Exam:  It is recommended that you eat a meal and drink a caffeinated beverage to counter act any effects of the stress agent.  Drink plenty of fluids for the remainder of the day and urinate frequently for the first couple of hours after the exam.  Your doctor will inform you of your test results within 7-10 business days.  For more information and frequently asked questions, please visit our website: [email or web address]  For questions about your test or how to prepare for your test, please call: Cardiac Imaging Nurse Navigators Office: [phone]

## 2023-12-18 NOTE — Progress Notes (Signed)
 Chief Complaint  Patient presents with   Follow-up    CAD   History of Present Illness: 73 yo male with history of CAD, DM, HTN, HLD, fibromyalgia and GERD who is here today for follow up. He is followed in our office by Dr. Alvis Ba. He is added onto my DOD schedule today with c/o dyspnea with exertion. He had a drug eluting stent placed in his Circumflex in 2008. Echo July 2024 with LVEF=60-65%. Moderate aortic stenosis. He is statin intolerant. He has been seen in our PharmD clinic and has refused to consider non statin therapies. He tells me today that he has dyspnea with exertion. He has mild chest pressure at rest and with exertion. He has ongoing issues with acid reflux. His energy level is down. He says that he has fibromyalgia and has diffuse body aches and numbness at baseline. No recent change.   Primary Care Physician: Elvira Hammersmith, MD  [facility] Cardiology: [place]  Past Medical History:  Diagnosis Date   Adenomatous colon polyp 03/2005   Anxiety    CAD S/P percutaneous coronary angioplasty 2008   PCI to circumflex with a Promus DES 2.5 mm x 8 mm   Cataract    BILATERAL,REMOVED   Degenerative joint disease    Diabetes mellitus without complication (HCC)    diet controlled,PT.DENIES UPDATED 04/10/22   Diverticulosis    Fatty liver 2016   pt denies   Fibromyalgia    GERD (gastroesophageal reflux disease)    Heart murmur    Phreesia 07/29/2020   Hyperlipidemia    statin intolerant   Hypertension    Internal hemorrhoids    Obesity, Class II, BMI 35-39.9    Pneumonia 2016   PONV (postoperative nausea and vomiting)     Past Surgical History:  Procedure Laterality Date   CHOLECYSTECTOMY N/A 02/18/2013   Procedure: LAPAROSCOPIC CHOLECYSTECTOMY WITH INTRAOPERATIVE CHOLANGIOGRAM;  Surgeon: Azucena Bollard, MD;  Location: [facility];  Service: General;  Laterality: N/A;   COLONOSCOPY     COLONOSCOPY  05/27/2022   CORONARY ANGIOPLASTY WITH STENT PLACEMENT   04/30/2007   2.5 mm Promus stent that was to 95% Circ;had 60-70% lesion to RCA   DOPPLER ECHOCARDIOGRAPHY  05/27/2002   [facility]normal EF 55-66%,   FOOT SURGERY Right    x2 fascia   HEEL SPUR SURGERY Right    HEMORRHOID SURGERY     Holter Monitor  04/07/2007   sinus tachy.;   JOINT REPLACEMENT N/A    Phreesia 07/29/2020   KNEE ARTHROSCOPY  03/27/2012   Procedure: ARTHROSCOPY KNEE;  Surgeon: Aurther Blue, MD;  [facility];  Service: Orthopedics;  Laterality: Left;   NM MYOCAR PERF WALL MOTION  12/19/2011   EXERCISED FOR 8-1/2 MINUTES RECHING 10 METABOLIC EQUIVALENTS. NO EVIDENCE  OF ISCHEMIA OR INFARCTION . EF 71%   POLYPECTOMY     renal dopplers  04/08/2007   relatively normal   TOTAL HIP ARTHROPLASTY Right 03/16/2018   Procedure: RIGHT TOTAL HIP ARTHROPLASTY ANTERIOR APPROACH;  Surgeon: Liliane Rei, MD;  [facility];  Service: Orthopedics;  Laterality: Right;   TOTAL HIP ARTHROPLASTY Left about 10 to 12 years ago   TOTAL KNEE ARTHROPLASTY Left 09/04/2012   Procedure: TOTAL KNEE ARTHROPLASTY;  Surgeon: Aurther Blue, MD;  [facility];  Service: Orthopedics;  Laterality: Left;   TOTAL KNEE ARTHROPLASTY Right 01/24/2020   Procedure: TOTAL KNEE ARTHROPLASTY;  Surgeon: Liliane Rei, MD;  [facility];  Service: Orthopedics;  Laterality: Right;     Current Outpatient Medications  Medication Sig Dispense Refill   aspirin  EC 81 MG tablet Take 81 mg by mouth daily. Swallow whole.     DULoxetine  (CYMBALTA ) 60 MG capsule TAKE 1 CAPSULE BY MOUTH EVERY DAY 90 capsule 1   hydrochlorothiazide  (HYDRODIURIL ) 25 MG tablet Take 1 tablet (25 mg total) by mouth daily. 90 tablet 3   losartan  (COZAAR ) 100 MG tablet Take 1 tablet (100 mg total) by mouth daily. 90 tablet 3   metoprolol  succinate (TOPROL -XL) 50 MG 24 hr tablet Take 1 tablet (50 mg total) by mouth daily. 90 tablet 3   No current facility-administered medications for this visit.    Allergies   Allergen Reactions   Amlodipine  Swelling    Patient had severe swelling   Crestor [Rosuvastatin] Other (See Comments)    myalgias   Valsartan  Other (See Comments)    Made patient feel tired and no energy   Warfarin And Related Other (See Comments)    Fast heart beat, went down hill, felt like he was dying    Social History   Socioeconomic History   Marital status: Married    Spouse name: Tanis Fan   Number of children: 1   Years of education: Not on file   Highest education level: 12th grade  Occupational History    Employer: [employer]   Occupation: RETIRED  Tobacco Use   Smoking status: Never    Passive exposure: Past (PARENTS SMOKED)   Smokeless tobacco: Never  Vaping Use   Vaping status: Never Used  Substance and Sexual Activity   Alcohol use: No   Drug use: No   Sexual activity: Not on file  Other Topics Concern   Not on file  Social History Narrative   Very little 0-2 drinks a week      Lives with wife and 2 dogs   Social Drivers of Corporate investment banker Strain: Low Risk  (11/15/2023)   Overall Financial Resource Strain (CARDIA)    Difficulty of Paying Living Expenses: Not hard at all  Food Insecurity: No Food Insecurity (11/15/2023)   Hunger Vital Sign    Worried About Running Out of Food in the Last Year: Never true    Ran Out of Food in the Last Year: Never true  Transportation Needs: No Transportation Needs (11/15/2023)   PRAPARE - Administrator, Civil Service (Medical): No    Lack of Transportation (Non-Medical): No  Physical Activity: Unknown (11/15/2023)   Exercise Vital Sign    Days of Exercise per Week: Patient declined    Minutes of Exercise per Session: 0 min  Stress: No Stress Concern Present (11/15/2023)   [employer] of Occupational Health - Occupational Stress Questionnaire    Feeling of Stress : Not at all  Social Connections: Socially Integrated (11/15/2023)   Social Connection and Isolation Panel [NHANES]     Frequency of Communication with Friends and Family: More than three times a week    Frequency of Social Gatherings with Friends and Family: More than three times a week    Attends Religious Services: More than 4 times per year    Active Member of [employer] or Organizations: Yes    Attends Banker Meetings: Never    Marital Status: Married  Catering manager Violence: Not At Risk (11/19/2023)   Humiliation, Afraid, Rape, and Kick questionnaire    Fear of Current or Ex-Partner: No    Emotionally Abused: No  Physically Abused: No    Sexually Abused: No    Family History  Problem Relation Age of Onset   Breast cancer Mother    Diabetes Mother    Prostate cancer Father    Colon cancer Neg Hx    Colon polyps Neg Hx    Esophageal cancer Neg Hx    Rectal cancer Neg Hx    Stomach cancer Neg Hx    Ulcerative colitis Neg Hx    Crohn's disease Neg Hx     Review of Systems:  As stated in the HPI and otherwise negative.   BP (!) 128/90   Pulse 81   Ht 6' (1.829 m)   Wt 264 lb 6.4 oz (119.9 kg)   SpO2 96%   BMI 35.86 kg/m   Physical Examination: General: Well developed, well nourished, NAD  HEENT: OP clear, mucus membranes moist  SKIN: warm, dry. No rashes. Neuro: No focal deficits  Musculoskeletal: Muscle strength 5/5 all ext  Psychiatric: Mood and affect normal  Neck: No JVD, no carotid bruits, no thyromegaly, no lymphadenopathy.  Lungs:Clear bilaterally, no wheezes, rhonci, crackles Cardiovascular: Regular rate and rhythm. Systolic murmur.  Abdomen:Soft. Bowel sounds present. Non-tender.  Extremities: No lower extremity edema. Pulses are 2 + in the bilateral DP/PT.  EKG:  EKG is ordered today. The ekg ordered today demonstrates  EKG Interpretation Date/Time:  Thursday Dec 18 2023 14:27:17 EDT Ventricular Rate:  81 PR Interval:  210 QRS Duration:  80 QT Interval:  362 QTC Calculation: 420 R Axis:   -40  Text Interpretation: Sinus rhythm with 1st degree A-V  block Left axis deviation Poor R wave progression Confirmed by Antoinette Batman [phone]) on 12/18/2023 2:35:01 PM    Recent Labs: 02/11/2023: TSH 5.120 07/08/2023: ALT 32; BUN 13; Creatinine, Ser 0.88; Hemoglobin 14.1; Platelets 241.0; Potassium 3.7; Sodium 139   Lipid Panel    Component Value Date/Time   CHOL 172 02/11/2023 0420   CHOL 177 06/20/2021 0841   TRIG 173 (H) 02/11/2023 0420   HDL 32 (L) 02/11/2023 0420   HDL 44 06/20/2021 0841   CHOLHDL 5.4 02/11/2023 0420   VLDL 35 02/11/2023 0420   LDLCALC 105 (H) 02/11/2023 0420   LDLCALC 117 (H) 06/20/2021 0841     Wt Readings from Last 3 Encounters:  12/18/23 264 lb 6.4 oz (119.9 kg)  11/19/23 262 lb (118.8 kg)  09/15/23 262 lb (118.8 kg)    Assessment and Plan:   1. CAD with angina: He is having chest pain at rest and with exertion. This could represent angina. Will arrange a PET CT stress to exclude ischemia. Continue ASA and Toprol .   2. HTN: BP is well controlled. Continue Toprol , Losartan  and hydrochlorothiazide .   3. HLD: He is statin intolerant. He has been seen in the PharmD clinic and did not wish to start Repatha  or Praluent.   4. Aortic stenosis: Moderate AS by echo in July 2024. Repeat echo now.   Labs/ tests ordered today include:   Orders Placed This Encounter  Procedures   NM PET CT CARDIAC PERFUSION MULTI W/ABSOLUTE BLOODFLOW   EKG 12-Lead   ECHOCARDIOGRAM COMPLETE   Disposition:   F/U with Dr. Alvis Ba in 4-6 weeks    Signed, Antoinette Batman, MD, [facility] 12/18/2023 3:08 PM    [facility] [address] [place], [place], [place]  [address] Phone: [phone]; Fax: [phone]

## 2023-12-28 ENCOUNTER — Encounter (HOSPITAL_COMMUNITY): Payer: Self-pay

## 2023-12-29 ENCOUNTER — Telehealth (HOSPITAL_COMMUNITY): Payer: Self-pay | Admitting: *Deleted

## 2023-12-29 NOTE — Telephone Encounter (Signed)
 Reaching out to patient to offer assistance regarding upcoming cardiac imaging study; pt verbalizes understanding of appt date/time, parking situation and where to check in, pre-test NPO status and verified current allergies; name and call back number provided for further questions should they arise  Samuel Brick RN Navigator Cardiac Imaging [facility] [phone] office [phone] cell  Patient aware to avoid caffeine 12 hours prior to his cardiac PET scan.

## 2023-12-30 ENCOUNTER — Ambulatory Visit (HOSPITAL_COMMUNITY)
Admission: RE | Admit: 2023-12-30 | Discharge: 2023-12-30 | Disposition: A | Discharge status: Home / Self Care | Source: Ambulatory Visit | Attending: Cardiovascular Disease | Admitting: Cardiovascular Disease

## 2023-12-30 DIAGNOSIS — I25118 Atherosclerotic heart disease of native coronary artery with other forms of angina pectoris: Secondary | ICD-10-CM | POA: Diagnosis not present

## 2023-12-30 LAB — NM PET CT CARDIAC PERFUSION MULTI W/ABSOLUTE BLOODFLOW
MBFR: 1.9
Nuc Rest EF: 69 %
Nuc Stress EF: 73 %
Rest MBF: 0.96 ml/g/min
Rest Nuclear Isotope Dose: 30 mCi
ST Depression (mm): 0 mm
Stress MBF: 1.82 ml/g/min
Stress Nuclear Isotope Dose: 30 mCi
TID: 1.18

## 2023-12-30 MED ORDER — RUBIDIUM RB82 GENERATOR (RUBYFILL)
29.9600 | PACK | Freq: Once | INTRAVENOUS | Status: AC
  Administered 2023-12-30: 29.96 via INTRAVENOUS

## 2023-12-30 MED ORDER — REGADENOSON 0.4 MG/5ML IV SOLN
0.4000 mg | Freq: Once | INTRAVENOUS | Status: AC
  Administered 2023-12-30: 0.4 mg via INTRAVENOUS

## 2023-12-30 MED ORDER — REGADENOSON 0.4 MG/5ML IV SOLN
INTRAVENOUS | Status: AC
Start: 2023-12-30 — End: ?
  Filled 2023-12-30: qty 5

## 2023-12-30 MED ORDER — RUBIDIUM RB82 GENERATOR (RUBYFILL)
30.0100 | PACK | Freq: Once | INTRAVENOUS | Status: AC
Start: 2023-12-30 — End: 2023-12-30
  Administered 2023-12-30: 30.01 via INTRAVENOUS

## 2023-12-30 NOTE — Progress Notes (Signed)
 Pt. Tolerated lexi scan well.

## 2023-12-31 ENCOUNTER — Telehealth: Payer: Self-pay | Admitting: Cardiovascular Disease

## 2023-12-31 ENCOUNTER — Ambulatory Visit: Payer: Self-pay | Admitting: Cardiovascular Disease

## 2023-12-31 NOTE — Telephone Encounter (Signed)
 Pt is returning call.

## 2023-12-31 NOTE — Telephone Encounter (Signed)
 Called patient.  Reviewed recommendations.  He will come in tomorrow morning 10:00 am with Dr. Abel Hoe.

## 2023-12-31 NOTE — Telephone Encounter (Signed)
Left the pt a message to call the office back. 

## 2023-12-31 NOTE — Telephone Encounter (Signed)
 Patient's wife is calling because the patient saw the results to PET CT on MyChart and the patient is now very concerned. Patient's wife is requesting for the nurse to speak with the patient for reassurance. Please advise.

## 2024-01-01 ENCOUNTER — Ambulatory Visit: Attending: Cardiovascular Disease | Admitting: Cardiovascular Disease

## 2024-01-01 ENCOUNTER — Encounter: Payer: Self-pay | Admitting: Cardiovascular Disease

## 2024-01-01 VITALS — BP 142/78 | HR 70 | Ht 72.0 in | Wt 266.8 lb

## 2024-01-01 DIAGNOSIS — I350 Nonrheumatic aortic (valve) stenosis: Secondary | ICD-10-CM | POA: Insufficient documentation

## 2024-01-01 DIAGNOSIS — I10 Essential (primary) hypertension: Secondary | ICD-10-CM | POA: Insufficient documentation

## 2024-01-01 DIAGNOSIS — Z01812 Encounter for preprocedural laboratory examination: Secondary | ICD-10-CM | POA: Diagnosis present

## 2024-01-01 DIAGNOSIS — I25110 Atherosclerotic heart disease of native coronary artery with unstable angina pectoris: Secondary | ICD-10-CM | POA: Insufficient documentation

## 2024-01-01 NOTE — Patient Instructions (Signed)
 Medication Instructions:  No changes *If you need a refill on your cardiac medications before your next appointment, please call your pharmacy*  Lab Work: Today: cbc, bmet   Testing/Procedures:       Cardiac/Peripheral Catheterization   You are scheduled for a cardiac catheterization on Tues June 10 with Dr. Randene Bustard.    1. Please arrive at the [facility] (Main Entrance A) at [facility]: [address] [place], [place] [address] at 5:30 am (This time is TWO hour(s) before your procedure to ensure your preparation).   Free valet parking service is available. You will check in at ADMITTING. The support person will be asked to wait in the waiting room.  It is OK to have someone drop you off and come back when you are ready to be discharged.        Special note: Every effort is made to have your procedure done on time. Please understand that emergencies sometimes delay scheduled procedures.  2. Diet: Do not eat solid foods after midnight.  You may have clear liquids until 5 AM the day of the procedure.  3. Labs: You will need to have blood drawn today.   You do not need to be fasting.  4. Medication instructions in preparation for your procedure:   Contrast Allergy: No  Do not take hydrochlorothiazide  on day of procedure.   On the morning of your procedure, take Aspirin  81 mg and any morning medicines NOT listed above.  You may use sips of water .  5. Plan to go home the same day, you will only stay overnight if medically necessary. 6. You MUST have a responsible adult to drive you home. 7. An adult MUST be with you the first 24 hours after you arrive home. 8. Bring a current list of your medications, and the last time and date medication taken. 9. Bring ID and current insurance cards. 10.Please wear clothes that are easy to get on and off and wear slip-on shoes.  Thank you for allowing us  to care for you!   -- [facility]  [facility]   Follow-Up: At [facility], you and your health needs are our priority.  As part of our continuing mission to provide you with exceptional heart care, our providers are all part of one team.  This team includes your primary Cardiologist (physician) and Advanced Practice Providers or APPs (Physician Assistants and Nurse Practitioners) who all work together to provide you with the care you need, when you need it.  Your next appointment:   3 week(s)  Provider:   Luana Rumple, MD or Advanced Practice Practitioner

## 2024-01-01 NOTE — H&P (View-Only) (Signed)
 Chief Complaint  Patient presents with   Follow-up    CAD, unstable angina   History of Present Illness: 73 yo male with history of CAD, DM, HTN, HLD, fibromyalgia and GERD who is here today for follow up. He is followed in our office by Dr. Alvis Ba. I saw him on 12/18/23 for an add in visit with c/o dyspnea and chest pressure, body aches from his fibromyalgia and fatigue. He had a drug eluting stent placed in his Circumflex in 2008. Echo July 2024 with LVEF=60-65%. Moderate aortic stenosis. He is statin intolerant. He has been seen in our PharmD clinic and has refused to consider non statin therapies.  PET CT stress test 12/30/23 with inferior wall perfusion defect c/w ischemia.   He is here today for follow up. He has ongoing dyspnea with exertion and chest pressure. He has chronic diffuse body aches without recent changes. He denies palpitations, lower extremity edema, orthopnea, PND, dizziness, near syncope or syncope.   Primary Care Physician: Elvira Hammersmith, MD  [facility] Cardiology: [place]  Past Medical History:  Diagnosis Date   Adenomatous colon polyp 03/2005   Anxiety    CAD S/P percutaneous coronary angioplasty 2008   PCI to circumflex with a Promus DES 2.5 mm x 8 mm   Cataract    BILATERAL,REMOVED   Degenerative joint disease    Diabetes mellitus without complication (HCC)    diet controlled,PT.DENIES UPDATED 04/10/22   Diverticulosis    Fatty liver 2016   pt denies   Fibromyalgia    GERD (gastroesophageal reflux disease)    Heart murmur    Phreesia 07/29/2020   Hyperlipidemia    statin intolerant   Hypertension    Internal hemorrhoids    Obesity, Class II, BMI 35-39.9    Pneumonia 2016   PONV (postoperative nausea and vomiting)     Past Surgical History:  Procedure Laterality Date   CHOLECYSTECTOMY N/A 02/18/2013   Procedure: LAPAROSCOPIC CHOLECYSTECTOMY WITH INTRAOPERATIVE CHOLANGIOGRAM;  Surgeon: Azucena Bollard, MD;  [facility];  Service:  General;  Laterality: N/A;   COLONOSCOPY     COLONOSCOPY  05/27/2022   CORONARY ANGIOPLASTY WITH STENT PLACEMENT  04/30/2007   2.5 mm Promus stent that was to 95% Circ;had 60-70% lesion to RCA   DOPPLER ECHOCARDIOGRAPHY  05/27/2002   [facility].-normal EF 55-66%,   FOOT SURGERY Right    x2 fascia   HEEL SPUR SURGERY Right    HEMORRHOID SURGERY     Holter Monitor  04/07/2007   sinus tachy.;   JOINT REPLACEMENT N/A    Phreesia 07/29/2020   KNEE ARTHROSCOPY  03/27/2012   Procedure: ARTHROSCOPY KNEE;  Surgeon: Aurther Blue, MD;  [facility];  Service: Orthopedics;  Laterality: Left;   NM MYOCAR PERF WALL MOTION  12/19/2011   EXERCISED FOR 8-1/2 MINUTES RECHING 10 METABOLIC EQUIVALENTS. NO EVIDENCE  OF ISCHEMIA OR INFARCTION . EF 71%   POLYPECTOMY     renal dopplers  04/08/2007   relatively normal   TOTAL HIP ARTHROPLASTY Right 03/16/2018   Procedure: RIGHT TOTAL HIP ARTHROPLASTY ANTERIOR APPROACH;  Surgeon: Liliane Rei, MD;  [facility];  Service: Orthopedics;  Laterality: Right;   TOTAL HIP ARTHROPLASTY Left about 10 to 12 years ago   TOTAL KNEE ARTHROPLASTY Left 09/04/2012   Procedure: TOTAL KNEE ARTHROPLASTY;  Surgeon: Aurther Blue, MD;  [facility];  Service: Orthopedics;  Laterality: Left;   TOTAL KNEE ARTHROPLASTY Right 01/24/2020   Procedure: TOTAL  KNEE ARTHROPLASTY;  Surgeon: Liliane Rei, MD;  [facility];  Service: Orthopedics;  Laterality: Right;     Current Outpatient Medications  Medication Sig Dispense Refill   aspirin  EC 81 MG tablet Take 81 mg by mouth daily. Swallow whole.     DULoxetine  (CYMBALTA ) 60 MG capsule TAKE 1 CAPSULE BY MOUTH EVERY DAY 90 capsule 1   hydrochlorothiazide  (HYDRODIURIL ) 25 MG tablet Take 1 tablet (25 mg total) by mouth daily. 90 tablet 3   losartan  (COZAAR ) 100 MG tablet Take 1 tablet (100 mg total) by mouth daily. 90 tablet 3   metoprolol  succinate (TOPROL -XL) 50 MG 24 hr tablet Take 1 tablet (50 mg total)  by mouth daily. 90 tablet 3   acetaminophen  (TYLENOL ) 500 MG tablet Take 1,000 mg by mouth every 6 (six) hours as needed for moderate pain (pain score 4-6).     No current facility-administered medications for this visit.    Allergies  Allergen Reactions   Amlodipine  Swelling    Patient had severe swelling   Crestor [Rosuvastatin] Other (See Comments)    myalgias   Silodosin Palpitations   Valsartan  Other (See Comments)    Made patient feel tired and no energy   Warfarin And Related Palpitations    Fast heart beat, went down hill, felt like he was dying    Social History   Socioeconomic History   Marital status: Married    Spouse name: Tanis Fan   Number of children: 1   Years of education: Not on file   Highest education level: 12th grade  Occupational History    Employer: [employer]   Occupation: RETIRED  Tobacco Use   Smoking status: Never    Passive exposure: Past (PARENTS SMOKED)   Smokeless tobacco: Never  Vaping Use   Vaping status: Never Used  Substance and Sexual Activity   Alcohol use: No   Drug use: No   Sexual activity: Not on file  Other Topics Concern   Not on file  Social History Narrative   Very little 0-2 drinks a week      Lives with wife and 2 dogs   Social Drivers of Corporate investment banker Strain: Low Risk  (11/15/2023)   Overall Financial Resource Strain (CARDIA)    Difficulty of Paying Living Expenses: Not hard at all  Food Insecurity: No Food Insecurity (11/15/2023)   Hunger Vital Sign    Worried About Running Out of Food in the Last Year: Never true    Ran Out of Food in the Last Year: Never true  Transportation Needs: No Transportation Needs (11/15/2023)   PRAPARE - Administrator, Civil Service (Medical): No    Lack of Transportation (Non-Medical): No  Physical Activity: Unknown (11/15/2023)   Exercise Vital Sign    Days of Exercise per Week: Patient declined    Minutes of Exercise per Session: 0 min  Stress: No  Stress Concern Present (11/15/2023)   [employer] of Occupational Health - Occupational Stress Questionnaire    Feeling of Stress : Not at all  Social Connections: Socially Integrated (11/15/2023)   Social Connection and Isolation Panel [NHANES]    Frequency of Communication with Friends and Family: More than three times a week    Frequency of Social Gatherings with Friends and Family: More than three times a week    Attends Religious Services: More than 4 times per year    Active Member of [employer] or Organizations: Yes  Attends Banker Meetings: Never    Marital Status: Married  Catering manager Violence: Not At Risk (11/19/2023)   Humiliation, Afraid, Rape, and Kick questionnaire    Fear of Current or Ex-Partner: No    Emotionally Abused: No    Physically Abused: No    Sexually Abused: No    Family History  Problem Relation Age of Onset   Breast cancer Mother    Diabetes Mother    Prostate cancer Father    Colon cancer Neg Hx    Colon polyps Neg Hx    Esophageal cancer Neg Hx    Rectal cancer Neg Hx    Stomach cancer Neg Hx    Ulcerative colitis Neg Hx    Crohn's disease Neg Hx     Review of Systems:  As stated in the HPI and otherwise negative.   BP (!) 142/78   Pulse 70   Ht 6' (1.829 m)   Wt 266 lb 12.8 oz (121 kg)   SpO2 95%   BMI 36.18 kg/m   Physical Examination:  General: Well developed, well nourished, NAD  HEENT: OP clear, mucus membranes moist  SKIN: warm, dry. No rashes. Neuro: No focal deficits  Musculoskeletal: Muscle strength 5/5 all ext  Psychiatric: Mood and affect normal  Neck: No JVD, no carotid bruits, no thyromegaly, no lymphadenopathy.  Lungs:Clear bilaterally, no wheezes, rhonci, crackles Cardiovascular: Regular rate and rhythm. No murmurs, gallops or rubs. Abdomen:Soft. Bowel sounds present. Non-tender.  Extremities: No lower extremity edema. Pulses are 2 + in the bilateral DP/PT.  EKG:  EKG is ordered today. The ekg  ordered today demonstrates EKG Interpretation Date/Time:  Thursday January 01 2024 10:01:02 EDT Ventricular Rate:  70 PR Interval:  212 QRS Duration:  88 QT Interval:  374 QTC Calculation: 403 R Axis:   -33  Text Interpretation: Sinus rhythm with 1st degree A-V block Left axis deviation When compared with ECG of 18-Dec-2023 14:27, No significant change was found Confirmed by Antoinette Batman [phone]) on 01/01/2024 10:22:00 AM   Recent Labs: 02/11/2023: TSH 5.120 07/08/2023: ALT 32; BUN 13; Creatinine, Ser 0.88; Hemoglobin 14.1; Platelets 241.0; Potassium 3.7; Sodium 139   Lipid Panel    Component Value Date/Time   CHOL 172 02/11/2023 0420   CHOL 177 06/20/2021 0841   TRIG 173 (H) 02/11/2023 0420   HDL 32 (L) 02/11/2023 0420   HDL 44 06/20/2021 0841   CHOLHDL 5.4 02/11/2023 0420   VLDL 35 02/11/2023 0420   LDLCALC 105 (H) 02/11/2023 0420   LDLCALC 117 (H) 06/20/2021 0841     Wt Readings from Last 3 Encounters:  01/01/24 266 lb 12.8 oz (121 kg)  12/18/23 264 lb 6.4 oz (119.9 kg)  11/19/23 262 lb (118.8 kg)    Assessment and Plan:   1. CAD with unstable angina: Ongoing dyspnea with exertion and exertional chest pressure concerning for unstable angina. PET CT stress test with possible inferior wall ischemia. Cardiac cath is indicated. Will arrange right and left heart cath given dyspnea, CAD and aortic stenosis.  -I have reviewed the risks, indications, and alternatives to cardiac catheterization, possible angioplasty, and stenting with the patient. Risks include but are not limited to bleeding, infection, vascular injury, stroke, myocardial infection, arrhythmia, kidney injury, radiation-related injury in the case of prolonged fluoroscopy use, emergency cardiac surgery, and death. The patient understands the risks of serious complication is 1-2 in 1000 with diagnostic cardiac cath and 1-2% or less with angioplasty/stenting. -Cardiac cath at [facility] on  01/06/24 with Dr.  Addie Holstein -Continue ASA and Toprol .  -He is statin intolerant.    2. HTN: BP is controlled. Continue Toprol , Losartan  and hydrochlorothiazide   3. HLD: He is statin intolerant. He has been seen in the PharmD clinic and did not wish to start Repatha  or Praluent.   4. Aortic stenosis: Moderate AS by echo in July 2024. Repeat echo is pending in July.   Labs/ tests ordered today include:   Orders Placed This Encounter  Procedures   CBC   Basic metabolic panel with GFR   EKG [id number]   Disposition:   F/U with Dr. Alvis Ba in 4-6 weeks    Signed, Antoinette Batman, MD, [facility] 01/01/2024 12:53 PM    [facility] [address] [place], [place], [place]  [address] Phone: [phone]; Fax: [phone]

## 2024-01-01 NOTE — Progress Notes (Signed)
 Chief Complaint  Patient presents with   Follow-up    CAD, unstable angina   History of Present Illness: 73 yo male with history of CAD, DM, HTN, HLD, fibromyalgia and GERD who is here today for follow up. He is followed in our office by Dr. Alvis Ba. I saw him on 12/18/23 for an add in visit with c/o dyspnea and chest pressure, body aches from his fibromyalgia and fatigue. He had a drug eluting stent placed in his Circumflex in 2008. Echo July 2024 with LVEF=60-65%. Moderate aortic stenosis. He is statin intolerant. He has been seen in our PharmD clinic and has refused to consider non statin therapies.  PET CT stress test 12/30/23 with inferior wall perfusion defect c/w ischemia.   He is here today for follow up. He has ongoing dyspnea with exertion and chest pressure. He has chronic diffuse body aches without recent changes. He denies palpitations, lower extremity edema, orthopnea, PND, dizziness, near syncope or syncope.   Primary Care Physician: Elvira Hammersmith, MD  [facility]: [place]  Past Medical History:  Diagnosis Date   Adenomatous colon polyp 03/2005   Anxiety    CAD S/P percutaneous coronary angioplasty 2008   PCI to circumflex with a Promus DES 2.5 mm x 8 mm   Cataract    BILATERAL,REMOVED   Degenerative joint disease    Diabetes mellitus without complication (HCC)    diet controlled,PT.DENIES UPDATED 04/10/22   Diverticulosis    Fatty liver 2016   pt denies   Fibromyalgia    GERD (gastroesophageal reflux disease)    Heart murmur    Phreesia 07/29/2020   Hyperlipidemia    statin intolerant   Hypertension    Internal hemorrhoids    Obesity, Class II, BMI 35-39.9    Pneumonia 2016   PONV (postoperative nausea and vomiting)     Past Surgical History:  Procedure Laterality Date   CHOLECYSTECTOMY N/A 02/18/2013   Procedure: LAPAROSCOPIC CHOLECYSTECTOMY WITH INTRAOPERATIVE CHOLANGIOGRAM;  Surgeon: Azucena Bollard, MD;  Location: [facility];  Service:  General;  Laterality: N/A;   COLONOSCOPY     COLONOSCOPY  05/27/2022   CORONARY ANGIOPLASTY WITH STENT PLACEMENT  04/30/2007   2.5 mm Promus stent that was to 95% Circ;had 60-70% lesion to RCA   DOPPLER ECHOCARDIOGRAPHY  05/27/2002   [facility]normal EF 55-66%,   FOOT SURGERY Right    x2 fascia   HEEL SPUR SURGERY Right    HEMORRHOID SURGERY     Holter Monitor  04/07/2007   sinus tachy.;   JOINT REPLACEMENT N/A    Phreesia 07/29/2020   KNEE ARTHROSCOPY  03/27/2012   Procedure: ARTHROSCOPY KNEE;  Surgeon: Aurther Blue, MD;  Location: [facility];  Service: Orthopedics;  Laterality: Left;   NM MYOCAR PERF WALL MOTION  12/19/2011   EXERCISED FOR 8-1/2 MINUTES RECHING 10 METABOLIC EQUIVALENTS. NO EVIDENCE  OF ISCHEMIA OR INFARCTION . EF 71%   POLYPECTOMY     renal dopplers  04/08/2007   relatively normal   TOTAL HIP ARTHROPLASTY Right 03/16/2018   Procedure: RIGHT TOTAL HIP ARTHROPLASTY ANTERIOR APPROACH;  Surgeon: Liliane Rei, MD;  Location: [facility];  Service: Orthopedics;  Laterality: Right;   TOTAL HIP ARTHROPLASTY Left about 10 to 12 years ago   TOTAL KNEE ARTHROPLASTY Left 09/04/2012   Procedure: TOTAL KNEE ARTHROPLASTY;  Surgeon: Aurther Blue, MD;  Location: [facility];  Service: Orthopedics;  Laterality: Left;   TOTAL KNEE ARTHROPLASTY Right 01/24/2020   Procedure: TOTAL  KNEE ARTHROPLASTY;  Surgeon: Liliane Rei, MD;  Location: [facility];  Service: Orthopedics;  Laterality: Right;     Current Outpatient Medications  Medication Sig Dispense Refill   aspirin  EC 81 MG tablet Take 81 mg by mouth daily. Swallow whole.     DULoxetine  (CYMBALTA ) 60 MG capsule TAKE 1 CAPSULE BY MOUTH EVERY DAY 90 capsule 1   hydrochlorothiazide  (HYDRODIURIL ) 25 MG tablet Take 1 tablet (25 mg total) by mouth daily. 90 tablet 3   losartan  (COZAAR ) 100 MG tablet Take 1 tablet (100 mg total) by mouth daily. 90 tablet 3   metoprolol  succinate (TOPROL -XL) 50 MG 24 hr tablet Take 1 tablet (50 mg total)  by mouth daily. 90 tablet 3   acetaminophen  (TYLENOL ) 500 MG tablet Take 1,000 mg by mouth every 6 (six) hours as needed for moderate pain (pain score 4-6).     No current facility-administered medications for this visit.    Allergies  Allergen Reactions   Amlodipine  Swelling    Patient had severe swelling   Crestor [Rosuvastatin] Other (See Comments)    myalgias   Silodosin Palpitations   Valsartan  Other (See Comments)    Made patient feel tired and no energy   Warfarin And Related Palpitations    Fast heart beat, went down hill, felt like he was dying    Social History   Socioeconomic History   Marital status: Married    Spouse name: Tanis Fan   Number of children: 1   Years of education: Not on file   Highest education level: 12th grade  Occupational History    Employer: [employer]   Occupation: RETIRED  Tobacco Use   Smoking status: Never    Passive exposure: Past (PARENTS SMOKED)   Smokeless tobacco: Never  Vaping Use   Vaping status: Never Used  Substance and Sexual Activity   Alcohol use: No   Drug use: No   Sexual activity: Not on file  Other Topics Concern   Not on file  Social History Narrative   Very little 0-2 drinks a week      Lives with wife and 2 dogs   Social Drivers of Corporate investment banker Strain: Low Risk  (11/15/2023)   Overall Financial Resource Strain (CARDIA)    Difficulty of Paying Living Expenses: Not hard at all  Food Insecurity: No Food Insecurity (11/15/2023)   Hunger Vital Sign    Worried About Running Out of Food in the Last Year: Never true    Ran Out of Food in the Last Year: Never true  Transportation Needs: No Transportation Needs (11/15/2023)   PRAPARE - Administrator, Civil Service (Medical): No    Lack of Transportation (Non-Medical): No  Physical Activity: Unknown (11/15/2023)   Exercise Vital Sign    Days of Exercise per Week: Patient declined    Minutes of Exercise per Session: 0 min  Stress: No  Stress Concern Present (11/15/2023)   [employer] of Occupational Health - Occupational Stress Questionnaire    Feeling of Stress : Not at all  Social Connections: Socially Integrated (11/15/2023)   Social Connection and Isolation Panel [NHANES]    Frequency of Communication with Friends and Family: More than three times a week    Frequency of Social Gatherings with Friends and Family: More than three times a week    Attends Religious Services: More than 4 times per year    Active Member of [employer] or Organizations: Yes  Attends Banker Meetings: Never    Marital Status: Married  Catering manager Violence: Not At Risk (11/19/2023)   Humiliation, Afraid, Rape, and Kick questionnaire    Fear of Current or Ex-Partner: No    Emotionally Abused: No    Physically Abused: No    Sexually Abused: No    Family History  Problem Relation Age of Onset   Breast cancer Mother    Diabetes Mother    Prostate cancer Father    Colon cancer Neg Hx    Colon polyps Neg Hx    Esophageal cancer Neg Hx    Rectal cancer Neg Hx    Stomach cancer Neg Hx    Ulcerative colitis Neg Hx    Crohn's disease Neg Hx     Review of Systems:  As stated in the HPI and otherwise negative.   BP (!) 142/78   Pulse 70   Ht 6' (1.829 m)   Wt 266 lb 12.8 oz (121 kg)   SpO2 95%   BMI 36.18 kg/m   Physical Examination:  General: Well developed, well nourished, NAD  HEENT: OP clear, mucus membranes moist  SKIN: warm, dry. No rashes. Neuro: No focal deficits  Musculoskeletal: Muscle strength 5/5 all ext  Psychiatric: Mood and affect normal  Neck: No JVD, no carotid bruits, no thyromegaly, no lymphadenopathy.  Lungs:Clear bilaterally, no wheezes, rhonci, crackles Cardiovascular: Regular rate and rhythm. No murmurs, gallops or rubs. Abdomen:Soft. Bowel sounds present. Non-tender.  Extremities: No lower extremity edema. Pulses are 2 + in the bilateral DP/PT.  EKG:  EKG is ordered today. The ekg  ordered today demonstrates EKG Interpretation Date/Time:  Thursday January 01 2024 10:01:02 EDT Ventricular Rate:  70 PR Interval:  212 QRS Duration:  88 QT Interval:  374 QTC Calculation: 403 R Axis:   -33  Text Interpretation: Sinus rhythm with 1st degree A-V block Left axis deviation When compared with ECG of 18-Dec-2023 14:27, No significant change was found Confirmed by Antoinette Batman [phone]) on 01/01/2024 10:22:00 AM   Recent Labs: 02/11/2023: TSH 5.120 07/08/2023: ALT 32; BUN 13; Creatinine, Ser 0.88; Hemoglobin 14.1; Platelets 241.0; Potassium 3.7; Sodium 139   Lipid Panel    Component Value Date/Time   CHOL 172 02/11/2023 0420   CHOL 177 06/20/2021 0841   TRIG 173 (H) 02/11/2023 0420   HDL 32 (L) 02/11/2023 0420   HDL 44 06/20/2021 0841   CHOLHDL 5.4 02/11/2023 0420   VLDL 35 02/11/2023 0420   LDLCALC 105 (H) 02/11/2023 0420   LDLCALC 117 (H) 06/20/2021 0841     Wt Readings from Last 3 Encounters:  01/01/24 266 lb 12.8 oz (121 kg)  12/18/23 264 lb 6.4 oz (119.9 kg)  11/19/23 262 lb (118.8 kg)    Assessment and Plan:   1. CAD with unstable angina: Ongoing dyspnea with exertion and exertional chest pressure concerning for unstable angina. PET CT stress test with possible inferior wall ischemia. Cardiac cath is indicated. Will arrange right and left heart cath given dyspnea, CAD and aortic stenosis.  -I have reviewed the risks, indications, and alternatives to cardiac catheterization, possible angioplasty, and stenting with the patient. Risks include but are not limited to bleeding, infection, vascular injury, stroke, myocardial infection, arrhythmia, kidney injury, radiation-related injury in the case of prolonged fluoroscopy use, emergency cardiac surgery, and death. The patient understands the risks of serious complication is 1-2 in 1000 with diagnostic cardiac cath and 1-2% or less with angioplasty/stenting. -Cardiac cath at [facility] on  01/06/24 with Dr.  Addie Holstein -Continue ASA and Toprol .  -He is statin intolerant.    2. HTN: BP is controlled. Continue Toprol , Losartan  and hydrochlorothiazide   3. HLD: He is statin intolerant. He has been seen in the PharmD clinic and did not wish to start Repatha  or Praluent.   4. Aortic stenosis: Moderate AS by echo in July 2024. Repeat echo is pending in July.   Labs/ tests ordered today include:   Orders Placed This Encounter  Procedures   CBC   Basic metabolic panel with GFR   EKG [id number]   Disposition:   F/U with Dr. Alvis Ba in 4-6 weeks    Signed, Antoinette Batman, MD, [facility] 01/01/2024 12:53 PM    [facility] [address] [place], [place], [place]  [address] Phone: [phone]; Fax: [phone]

## 2024-01-03 ENCOUNTER — Encounter (HOSPITAL_COMMUNITY): Payer: Self-pay | Admitting: Cardiology

## 2024-01-05 ENCOUNTER — Telehealth: Payer: Self-pay | Admitting: *Deleted

## 2024-01-05 NOTE — Telephone Encounter (Addendum)
 Cardiac Catheterization scheduled at [facility] for: Tuesday January 06, 2024 7:30 AM Arrival time [facility] Main Entrance A at: 5:30 AM  Nothing to eat after midnight prior to procedure, clear liquids until 5 AM day of procedure.  Medication instructions: -Hold:  Hydrochlorothiazide -AM of procedure -Other usual morning medications can be taken with sips of water  including aspirin  81 mg.  Plan to go home the same day, you will only stay overnight if medically necessary.  You must have responsible adult to drive you home.  Someone must be with you the first 24 hours after you arrive home.  Reviewed procedure instructions with patient.  01/01/24 BMP not resulted yet-per [facility]-delay in processing, I have been told by [facility] Terrea Ferrier) that they be processed priority.

## 2024-01-06 ENCOUNTER — Inpatient Hospital Stay (HOSPITAL_COMMUNITY)
Admission: RE | Admit: 2024-01-06 | Discharge: 2024-01-14 | DRG: 234 | Disposition: A | Discharge status: Home / Self Care | Attending: Thoracic Surgery (Cardiothoracic Vascular Surgery) | Admitting: Thoracic Surgery (Cardiothoracic Vascular Surgery)

## 2024-01-06 ENCOUNTER — Other Ambulatory Visit: Payer: Self-pay

## 2024-01-06 ENCOUNTER — Encounter (HOSPITAL_COMMUNITY)
Admission: RE | Disposition: A | Discharge status: Home / Self Care | Payer: Self-pay | Source: Home / Self Care | Attending: Thoracic Surgery (Cardiothoracic Vascular Surgery)

## 2024-01-06 DIAGNOSIS — Z6835 Body mass index (BMI) 35.0-35.9, adult: Secondary | ICD-10-CM

## 2024-01-06 DIAGNOSIS — I10 Essential (primary) hypertension: Secondary | ICD-10-CM | POA: Diagnosis not present

## 2024-01-06 DIAGNOSIS — Z955 Presence of coronary angioplasty implant and graft: Secondary | ICD-10-CM

## 2024-01-06 DIAGNOSIS — I97190 Other postprocedural cardiac functional disturbances following cardiac surgery: Secondary | ICD-10-CM | POA: Diagnosis not present

## 2024-01-06 DIAGNOSIS — Z8249 Family history of ischemic heart disease and other diseases of the circulatory system: Secondary | ICD-10-CM | POA: Diagnosis not present

## 2024-01-06 DIAGNOSIS — Z803 Family history of malignant neoplasm of breast: Secondary | ICD-10-CM

## 2024-01-06 DIAGNOSIS — I350 Nonrheumatic aortic (valve) stenosis: Secondary | ICD-10-CM | POA: Diagnosis present

## 2024-01-06 DIAGNOSIS — E785 Hyperlipidemia, unspecified: Secondary | ICD-10-CM | POA: Diagnosis present

## 2024-01-06 DIAGNOSIS — I110 Hypertensive heart disease with heart failure: Secondary | ICD-10-CM | POA: Diagnosis present

## 2024-01-06 DIAGNOSIS — Z9049 Acquired absence of other specified parts of digestive tract: Secondary | ICD-10-CM

## 2024-01-06 DIAGNOSIS — Z8673 Personal history of transient ischemic attack (TIA), and cerebral infarction without residual deficits: Secondary | ICD-10-CM

## 2024-01-06 DIAGNOSIS — R110 Nausea: Secondary | ICD-10-CM | POA: Diagnosis not present

## 2024-01-06 DIAGNOSIS — Z888 Allergy status to other drugs, medicaments and biological substances status: Secondary | ICD-10-CM | POA: Diagnosis not present

## 2024-01-06 DIAGNOSIS — E66812 Obesity, class 2: Secondary | ICD-10-CM | POA: Diagnosis present

## 2024-01-06 DIAGNOSIS — Y831 Surgical operation with implant of artificial internal device as the cause of abnormal reaction of the patient, or of later complication, without mention of misadventure at the time of the procedure: Secondary | ICD-10-CM | POA: Diagnosis present

## 2024-01-06 DIAGNOSIS — J9811 Atelectasis: Secondary | ICD-10-CM | POA: Diagnosis present

## 2024-01-06 DIAGNOSIS — F419 Anxiety disorder, unspecified: Secondary | ICD-10-CM | POA: Diagnosis present

## 2024-01-06 DIAGNOSIS — Z01810 Encounter for preprocedural cardiovascular examination: Secondary | ICD-10-CM | POA: Diagnosis not present

## 2024-01-06 DIAGNOSIS — Z79899 Other long term (current) drug therapy: Secondary | ICD-10-CM

## 2024-01-06 DIAGNOSIS — Z96643 Presence of artificial hip joint, bilateral: Secondary | ICD-10-CM | POA: Diagnosis present

## 2024-01-06 DIAGNOSIS — I200 Unstable angina: Principal | ICD-10-CM

## 2024-01-06 DIAGNOSIS — I272 Pulmonary hypertension, unspecified: Secondary | ICD-10-CM | POA: Diagnosis present

## 2024-01-06 DIAGNOSIS — T82855A Stenosis of coronary artery stent, initial encounter: Secondary | ICD-10-CM | POA: Diagnosis present

## 2024-01-06 DIAGNOSIS — D72829 Elevated white blood cell count, unspecified: Secondary | ICD-10-CM | POA: Diagnosis not present

## 2024-01-06 DIAGNOSIS — I25110 Atherosclerotic heart disease of native coronary artery with unstable angina pectoris: Principal | ICD-10-CM | POA: Diagnosis present

## 2024-01-06 DIAGNOSIS — I2510 Atherosclerotic heart disease of native coronary artery without angina pectoris: Secondary | ICD-10-CM

## 2024-01-06 DIAGNOSIS — Z96653 Presence of artificial knee joint, bilateral: Secondary | ICD-10-CM | POA: Diagnosis present

## 2024-01-06 DIAGNOSIS — Z8701 Personal history of pneumonia (recurrent): Secondary | ICD-10-CM

## 2024-01-06 DIAGNOSIS — Z833 Family history of diabetes mellitus: Secondary | ICD-10-CM | POA: Diagnosis not present

## 2024-01-06 DIAGNOSIS — I25119 Atherosclerotic heart disease of native coronary artery with unspecified angina pectoris: Secondary | ICD-10-CM | POA: Diagnosis not present

## 2024-01-06 DIAGNOSIS — I5032 Chronic diastolic (congestive) heart failure: Secondary | ICD-10-CM | POA: Diagnosis present

## 2024-01-06 DIAGNOSIS — Z8042 Family history of malignant neoplasm of prostate: Secondary | ICD-10-CM

## 2024-01-06 DIAGNOSIS — Z7982 Long term (current) use of aspirin: Secondary | ICD-10-CM

## 2024-01-06 DIAGNOSIS — M797 Fibromyalgia: Secondary | ICD-10-CM | POA: Diagnosis present

## 2024-01-06 DIAGNOSIS — K219 Gastro-esophageal reflux disease without esophagitis: Secondary | ICD-10-CM | POA: Diagnosis present

## 2024-01-06 DIAGNOSIS — K089 Disorder of teeth and supporting structures, unspecified: Secondary | ICD-10-CM | POA: Diagnosis present

## 2024-01-06 DIAGNOSIS — E119 Type 2 diabetes mellitus without complications: Secondary | ICD-10-CM | POA: Diagnosis present

## 2024-01-06 DIAGNOSIS — I4891 Unspecified atrial fibrillation: Secondary | ICD-10-CM | POA: Diagnosis not present

## 2024-01-06 DIAGNOSIS — Z951 Presence of aortocoronary bypass graft: Secondary | ICD-10-CM

## 2024-01-06 DIAGNOSIS — Z860101 Personal history of adenomatous and serrated colon polyps: Secondary | ICD-10-CM

## 2024-01-06 LAB — POCT I-STAT EG7
Acid-base deficit: 3 mmol/L — ABNORMAL HIGH (ref 0.0–2.0)
Acid-base deficit: 3 mmol/L — ABNORMAL HIGH (ref 0.0–2.0)
Bicarbonate: 23.6 mmol/L (ref 20.0–28.0)
Bicarbonate: 23.6 mmol/L (ref 20.0–28.0)
Calcium, Ion: 1.22 mmol/L (ref 1.15–1.40)
Calcium, Ion: 1.23 mmol/L (ref 1.15–1.40)
HCT: 35 % — ABNORMAL LOW (ref 39.0–52.0)
HCT: 36 % — ABNORMAL LOW (ref 39.0–52.0)
Hemoglobin: 11.9 g/dL — ABNORMAL LOW (ref 13.0–17.0)
Hemoglobin: 12.2 g/dL — ABNORMAL LOW (ref 13.0–17.0)
O2 Saturation: 67 %
O2 Saturation: 69 %
Potassium: 4 mmol/L (ref 3.5–5.1)
Potassium: 4 mmol/L (ref 3.5–5.1)
Sodium: 140 mmol/L (ref 135–145)
Sodium: 141 mmol/L (ref 135–145)
TCO2: 25 mmol/L (ref 22–32)
TCO2: 25 mmol/L (ref 22–32)
pCO2, Ven: 45.5 mmHg (ref 44–60)
pCO2, Ven: 45.7 mmHg (ref 44–60)
pH, Ven: 7.322 (ref 7.25–7.43)
pH, Ven: 7.323 (ref 7.25–7.43)
pO2, Ven: 38 mmHg (ref 32–45)
pO2, Ven: 39 mmHg (ref 32–45)

## 2024-01-06 LAB — BASIC METABOLIC PANEL WITH GFR
Anion gap: 8 (ref 5–15)
BUN: 12 mg/dL (ref 8–23)
CO2: 23 mmol/L (ref 22–32)
Calcium: 8.5 mg/dL — ABNORMAL LOW (ref 8.9–10.3)
Chloride: 106 mmol/L (ref 98–111)
Creatinine, Ser: 0.89 mg/dL (ref 0.61–1.24)
GFR, Estimated: >60 mL/min (ref >60)
Glucose, Bld: 105 mg/dL — ABNORMAL HIGH (ref 70–99)
Potassium: 3.9 mmol/L (ref 3.5–5.1)
Sodium: 137 mmol/L (ref 135–145)

## 2024-01-06 LAB — POCT I-STAT 7, (LYTES, BLD GAS, ICA,H+H)
Acid-base deficit: 4 mmol/L — ABNORMAL HIGH (ref 0.0–2.0)
Bicarbonate: 20.9 mmol/L (ref 20.0–28.0)
Calcium, Ion: 1.22 mmol/L (ref 1.15–1.40)
HCT: 35 % — ABNORMAL LOW (ref 39.0–52.0)
Hemoglobin: 11.9 g/dL — ABNORMAL LOW (ref 13.0–17.0)
O2 Saturation: 96 %
Potassium: 4.1 mmol/L (ref 3.5–5.1)
Sodium: 140 mmol/L (ref 135–145)
TCO2: 22 mmol/L (ref 22–32)
pCO2 arterial: 37.2 mmHg (ref 32–48)
pH, Arterial: 7.358 (ref 7.35–7.45)
pO2, Arterial: 84 mmHg (ref 83–108)

## 2024-01-06 LAB — HEPARIN LEVEL (UNFRACTIONATED): Heparin Unfractionated: 0.22 [IU]/mL — ABNORMAL LOW (ref 0.30–0.70)

## 2024-01-06 LAB — GLUCOSE, CAPILLARY: Glucose-Capillary: 101 mg/dL — ABNORMAL HIGH (ref 70–99)

## 2024-01-06 MED ORDER — LABETALOL HCL 5 MG/ML IV SOLN: 10.0000 mg | INTRAVENOUS | Status: AC | PRN

## 2024-01-06 MED ORDER — LIDOCAINE HCL (PF) 1 % IJ SOLN
INTRAMUSCULAR | Status: AC
  Filled 2024-01-06: qty 30

## 2024-01-06 MED ORDER — SODIUM CHLORIDE 0.9 % IV SOLN: INTRAVENOUS | Status: AC

## 2024-01-06 MED ORDER — HEPARIN (PORCINE) IN NACL 1000-0.9 UT/500ML-% IV SOLN
INTRAVENOUS | Status: DC | PRN
  Administered 2024-01-06 (×2): 500 mL

## 2024-01-06 MED ORDER — IOHEXOL 350 MG/ML SOLN
INTRAVENOUS | Status: DC | PRN
  Administered 2024-01-06: 90 mL

## 2024-01-06 MED ORDER — LOSARTAN POTASSIUM 50 MG PO TABS
100.0000 mg | ORAL_TABLET | Freq: Every day | ORAL | Status: DC
  Administered 2024-01-07: 100 mg via ORAL
  Filled 2024-01-06: qty 2

## 2024-01-06 MED ORDER — METOPROLOL SUCCINATE ER 50 MG PO TB24
50.0000 mg | ORAL_TABLET | Freq: Every day | ORAL | Status: DC
  Administered 2024-01-07 – 2024-01-08 (×2): 50 mg via ORAL
  Filled 2024-01-06 (×2): qty 1

## 2024-01-06 MED ORDER — ACETAMINOPHEN 325 MG PO TABS
650.0000 mg | ORAL_TABLET | ORAL | Status: AC | PRN
Start: 2024-01-06 — End: ?
  Filled 2024-01-06: qty 2

## 2024-01-06 MED ORDER — LIDOCAINE HCL (PF) 1 % IJ SOLN
INTRAMUSCULAR | Status: DC | PRN
Start: 2024-01-06 — End: 2024-01-06
  Administered 2024-01-06 (×2): 2 mL

## 2024-01-06 MED ORDER — ASPIRIN 81 MG PO CHEW: 81.0000 mg | CHEWABLE_TABLET | ORAL | Status: DC

## 2024-01-06 MED ORDER — MIDAZOLAM HCL 2 MG/2ML IJ SOLN
INTRAMUSCULAR | Status: DC | PRN
  Administered 2024-01-06: 1 mg via INTRAVENOUS

## 2024-01-06 MED ORDER — DULOXETINE HCL 60 MG PO CPEP
60.0000 mg | ORAL_CAPSULE | Freq: Every day | ORAL | Status: DC
  Administered 2024-01-07 – 2024-01-14 (×7): 60 mg via ORAL
  Filled 2024-01-06 (×7): qty 1

## 2024-01-06 MED ORDER — ASPIRIN 81 MG PO TBEC
81.0000 mg | DELAYED_RELEASE_TABLET | Freq: Every day | ORAL | Status: DC
  Administered 2024-01-07 – 2024-01-08 (×2): 81 mg via ORAL
  Filled 2024-01-06 (×2): qty 1

## 2024-01-06 MED ORDER — SODIUM CHLORIDE 0.9 % IV SOLN: 250.0000 mL | INTRAVENOUS | Status: AC | PRN

## 2024-01-06 MED ORDER — ONDANSETRON HCL 4 MG/2ML IJ SOLN: 4.0000 mg | Freq: Four times a day (QID) | INTRAMUSCULAR | Status: DC | PRN

## 2024-01-06 MED ORDER — HEPARIN SODIUM (PORCINE) 1000 UNIT/ML IJ SOLN
INTRAMUSCULAR | Status: DC | PRN
  Administered 2024-01-06: 6000 [IU] via INTRAVENOUS

## 2024-01-06 MED ORDER — SODIUM CHLORIDE 0.9 % WEIGHT BASED INFUSION: 1.0000 mL/kg/h | INTRAVENOUS | Status: DC

## 2024-01-06 MED ORDER — SODIUM CHLORIDE 0.9 % WEIGHT BASED INFUSION: 3.0000 mL/kg/h | INTRAVENOUS | Status: DC

## 2024-01-06 MED ORDER — FENTANYL CITRATE (PF) 100 MCG/2ML IJ SOLN
INTRAMUSCULAR | Status: AC
  Filled 2024-01-06: qty 2

## 2024-01-06 MED ORDER — SODIUM CHLORIDE 0.9% FLUSH: 3.0000 mL | INTRAVENOUS | Status: DC | PRN

## 2024-01-06 MED ORDER — FUROSEMIDE 40 MG PO TABS
40.0000 mg | ORAL_TABLET | Freq: Every day | ORAL | Status: DC
  Administered 2024-01-08: 40 mg via ORAL
  Filled 2024-01-06 (×2): qty 1

## 2024-01-06 MED ORDER — HEPARIN SODIUM (PORCINE) 1000 UNIT/ML IJ SOLN
INTRAMUSCULAR | Status: AC
  Filled 2024-01-06: qty 10

## 2024-01-06 MED ORDER — VERAPAMIL HCL 2.5 MG/ML IV SOLN
INTRAVENOUS | Status: AC
  Filled 2024-01-06: qty 2

## 2024-01-06 MED ORDER — ACETAMINOPHEN 500 MG PO TABS: 1000.0000 mg | ORAL_TABLET | Freq: Four times a day (QID) | ORAL | Status: DC | PRN

## 2024-01-06 MED ORDER — FENTANYL CITRATE (PF) 100 MCG/2ML IJ SOLN
INTRAMUSCULAR | Status: DC | PRN
  Administered 2024-01-06: 25 ug via INTRAVENOUS

## 2024-01-06 MED ORDER — VERAPAMIL HCL 2.5 MG/ML IV SOLN
INTRAVENOUS | Status: DC | PRN
  Administered 2024-01-06: 10 mL via INTRA_ARTERIAL

## 2024-01-06 MED ORDER — HYDRALAZINE HCL 20 MG/ML IJ SOLN: 10.0000 mg | INTRAMUSCULAR | Status: AC | PRN

## 2024-01-06 MED ORDER — MIDAZOLAM HCL 2 MG/2ML IJ SOLN
INTRAMUSCULAR | Status: AC
  Filled 2024-01-06: qty 2

## 2024-01-06 MED ORDER — HEPARIN (PORCINE) 25000 UT/250ML-% IV SOLN
1700.0000 [IU]/h | INTRAVENOUS | Status: DC
  Administered 2024-01-06: 1450 [IU]/h via INTRAVENOUS
  Administered 2024-01-07: 1650 [IU]/h via INTRAVENOUS
  Administered 2024-01-08 (×2): 1700 [IU]/h via INTRAVENOUS
  Filled 2024-01-06 (×4): qty 250

## 2024-01-06 MED ORDER — SODIUM CHLORIDE 0.9% FLUSH
3.0000 mL | Freq: Two times a day (BID) | INTRAVENOUS | Status: DC
  Administered 2024-01-06 – 2024-01-08 (×3): 3 mL via INTRAVENOUS

## 2024-01-06 MED ORDER — HEPARIN (PORCINE) IN NACL 2000-0.9 UNIT/L-% IV SOLN
INTRAVENOUS | Status: DC | PRN
  Administered 2024-01-06: 1000 mL

## 2024-01-06 NOTE — Progress Notes (Signed)
 PHARMACY - ANTICOAGULATION CONSULT NOTE  Pharmacy Consult for Heparin  Indication: chest pain/ACS  Allergies  Allergen Reactions   Amlodipine  Swelling    Patient had severe swelling   Crestor [Rosuvastatin] Other (See Comments)    myalgias   Silodosin Palpitations   Valsartan  Other (See Comments)    Made patient feel tired and no energy   Warfarin And Related Palpitations    Fast heart beat, went down hill, felt like he was dying    Patient Measurements: Height: 6\' 4"  (193 cm) Weight: 120.8 kg (266 lb 5.1 oz) IBW/kg (Calculated) : 86.8 HEPARIN  DW (KG): 112.2  Vital Signs: Temp: 98.5 F (36.9 C) (06/10 1302) Temp Source: Oral (06/10 1302) BP: 136/75 (06/10 1302) Pulse Rate: 63 (06/10 1302)  Labs: Recent Labs    01/06/24 0603  CREATININE 0.89    Estimated Creatinine Clearance: 106.5 mL/min (by C-G formula based on SCr of 0.89 mg/dL).   Medical History: Past Medical History:  Diagnosis Date   Adenomatous colon polyp 03/2005   Anxiety    CAD S/P percutaneous coronary angioplasty 2008   PCI to AVG Cx with a Promus DES 2.5 mm x 8 mm   Cataract    BILATERAL,REMOVED   Degenerative joint disease    Diabetes mellitus without complication (HCC)    diet controlled,PT.DENIES UPDATED 04/10/22   Diverticulosis    Fatty liver 2016   pt denies   Fibromyalgia    GERD (gastroesophageal reflux disease)    Heart murmur    Phreesia 07/29/2020   Hyperlipidemia    statin intolerant   Hypertension    Internal hemorrhoids    Obesity, Class II, BMI 35-39.9    Pneumonia 2016   PONV (postoperative nausea and vomiting)     Medications:  Scheduled:   [START ON 01/07/2024] aspirin  EC  81 mg Oral Daily   [START ON 01/07/2024] DULoxetine   60 mg Oral Daily   furosemide   40 mg Oral Daily   [START ON 01/07/2024] losartan   100 mg Oral Daily   [START ON 01/07/2024] metoprolol  succinate  50 mg Oral Daily   sodium chloride  flush  3 mL Intravenous Q12H   Infusions:   sodium chloride       heparin      PRN: sodium chloride , acetaminophen , acetaminophen , hydrALAZINE, labetalol , ondansetron  (ZOFRAN ) IV, sodium chloride  flush  Assessment: 73 yo male s/p cath with disease in LAD, Diagonal, and proximal RCA. Pharmacy consulted to dose IV heparin  while undergoing TCTS consult. Not on anticoagulants PTA. CBC wnl.  Goal of Therapy:  Heparin  level 0.3-0.7 units/ml Monitor platelets by anticoagulation protocol: Yes   Plan:  At 15:00, begin heparin  1450 units/hr (no bolus) Check heparin  level in 8hrs at 23:00 Check daily heparin  level and CBC Monitor for signs/symptoms of bleeding  Armanda Bern, PharmD, BCPS 01/06/2024,2:24 PM  Please check AMION for all [facility] phone numbers After 10:00 PM, call Main Pharmacy [phone]

## 2024-01-06 NOTE — Interval H&P Note (Signed)
 History and Physical Interval Note:  01/06/2024 7:39 AM  Samuel Chambers  has presented today for surgery, with the diagnosis of unstable angina.  The various methods of treatment have been discussed with the patient and family. After consideration of risks, benefits and other options for treatment, the patient has consented to  Procedure(s): RIGHT/LEFT HEART CATH AND CORONARY ANGIOGRAPHY (N/A)  PERCUTANEOUS CORONARY INTERVENTION   as a surgical intervention.  The patient's history has been reviewed, patient examined, no change in status, stable for surgery.  I have reviewed the patient's chart and labs.  Questions were answered to the patient's satisfaction.    Cath Lab Visit (complete for each Cath Lab visit)  Clinical Evaluation Leading to the Procedure:   ACS: No.  Non-ACS:    Anginal Classification: CCS II  Anti-ischemic medical therapy: Minimal Therapy (1 class of medications)  Non-Invasive Test Results: Intermediate-risk stress test findings: cardiac mortality 1-3%/year  Prior CABG: No previous CABG    Randene Bustard

## 2024-01-06 NOTE — H&P (View-Only) (Signed)
 [address]       [place] [address]             [phone]        [facility] Medical Record #[id number] Date of Birth: [birth year]  Referring: Dr. Addie Holstein, MD Primary Care: Elvira Hammersmith, MD Primary Cardiologist:Mihai Croitoru, MD  Chief Complaint:   Numbness in left arm;dyspnea, mostly exertional but occasionally at rest Reason for consultation: Coronary artery disease, mild aortic stenosis   History of Present Illness:     This is a 73 year old male with a past medical history of diet controlled diabetes mellitus, CAD (DES stent to Circumflex 2008), hypertension, hyperlipidemia (statin intolerant), obesity, GERD, possible TIA (last year, per wife), and fibromyalgia who Saw Dr. Abel Hoe on 12/18/2023 because of complaints of dyspnea with exertion. He had a drug eluting stent placed in his Circumflex in 2008. Echo July 2024 with LVEF=60-65%, mild aortic stenosis, aortic valve peak gradient  measures 41.7 mmHg, and aortic valve area, by VTI measures 1.15 cm. He had a CT cardiac perfusion scan done 12/30/2023 which showed inferior wall perfusion defect c/w ischemia. He presented today for a cardiac catheterization. Results showed mid LAD with a 70% stenosis, mid LAD to distal LAD with a 90% stenosis, first Diagonal with a 90% stenosis, proximal RCA 2 with a 95% stenosis (discrete eccentric/ulcerated), distal RCA lesion is 80% stenosed, and mild pulmonary hypertension. Echocardiogram has been ordered but not taken yet. Cardiothoracic consultation was requested. At the time of my exam, patient denies shortness of breath or chest pain. He does complaint of body hurting all over, but has had for awhile and has a history of fibromyalgia.  Patient states he is active. He mows lawn, uses weed eater, goes fishing,etc. He is married. His wife and daughter were at the bedside during the consultation and questions were answered accordingly.   Current  Activity/ Functional Status: Patient is independent with mobility/ambulation, transfers, ADL's, IADL's.   Zubrod Score: At the time of surgery this patient's most appropriate activity status/level should be described as: []     0    Normal activity, no symptoms [x]     1    Restricted in physical strenuous activity but ambulatory, able to do out light work []     2    Ambulatory and capable of self care, unable to do work activities, up and about more than 50% of the time                            []     3    Only limited self care, in bed greater than 50% of waking hours []     4    Completely disabled, no self care, confined to bed or chair []     5    Moribund  Past Medical History:  Diagnosis Date   Adenomatous colon polyp 03/2005   Anxiety    CAD S/P percutaneous coronary angioplasty 2008   PCI to AVG Cx with a Promus DES 2.5 mm x 8 mm   Cataract    BILATERAL,REMOVED   Degenerative joint disease    Diabetes mellitus without complication (HCC)    diet controlled,PT.DENIES UPDATED 04/10/22   Diverticulosis    Fatty liver 2016   pt denies   Fibromyalgia    GERD (gastroesophageal reflux disease)    Heart murmur    [employer]reesia 07/29/2020  Hyperlipidemia    statin intolerant   Hypertension    Internal hemorrhoids    Obesity, Class II, BMI 35-39.9    Pneumonia 2016   PONV (postoperative nausea and vomiting)     Past Surgical History:  Procedure Laterality Date   CHOLECYSTECTOMY N/A 02/18/2013   Procedure: LAPAROSCOPIC CHOLECYSTECTOMY WITH INTRAOPERATIVE CHOLANGIOGRAM;  Surgeon: Azucena Bollard, MD;  Location: [facility];  Service: General;  Laterality: N/A;   COLONOSCOPY     COLONOSCOPY  05/27/2022   CORONARY ANGIOPLASTY WITH STENT PLACEMENT  04/30/2007   AVG Cx 95% -> DES PCI with Promus DES 2.5 mm x 8 mm;had 60-70% lesion to RCA   DOPPLER ECHOCARDIOGRAPHY  05/27/2002   [facility]normal EF 55-66%,   FOOT SURGERY Right    x2 fascia   HEEL SPUR SURGERY Right    HEMORRHOID  SURGERY     Holter Monitor  04/07/2007   sinus tachy.;   JOINT REPLACEMENT N/A    [employer]reesia 07/29/2020   KNEE ARTHROSCOPY  03/27/2012   Procedure: ARTHROSCOPY KNEE;  Surgeon: Aurther Blue, MD;  Location: [facility];  Service: Orthopedics;  Laterality: Left;   NM MYOCAR PERF WALL MOTION  12/19/2011   EXERCISED FOR 8-1/2 MINUTES RECHING 10 METABOLIC EQUIVALENTS. NO EVIDENCE  OF ISCHEMIA OR INFARCTION . EF 71%   POLYPECTOMY     renal dopplers  04/08/2007   relatively normal   TOTAL HIP ARTHROPLASTY Right 03/16/2018   Procedure: RIGHT TOTAL HIP ARTHROPLASTY ANTERIOR APPROACH;  Surgeon: Liliane Rei, MD;  Location: [facility];  Service: Orthopedics;  Laterality: Right;   TOTAL HIP ARTHROPLASTY Left about 10 to 12 years ago   TOTAL KNEE ARTHROPLASTY Left 09/04/2012   Procedure: TOTAL KNEE ARTHROPLASTY;  Surgeon: Aurther Blue, MD;  Location: [facility];  Service: Orthopedics;  Laterality: Left;   TOTAL KNEE ARTHROPLASTY Right 01/24/2020   Procedure: TOTAL KNEE ARTHROPLASTY;  Surgeon: Liliane Rei, MD;  Location: [facility];  Service: Orthopedics;  Laterality: Right;   Vitrectomy Bilateral cataract surgery  Social History   Tobacco Use  Smoking Status Never   Passive exposure: Past (PARENTS SMOKED)  Smokeless Tobacco Never    Social History   Substance and Sexual Activity  Alcohol Use No   Family History: Both mother and father are deceased and were in their 37's Brother deceased and had DM, heart issues  Allergies  Allergen Reactions   Amlodipine  Swelling    Patient had severe swelling   Crestor [Rosuvastatin] Other (See Comments)    myalgias   Silodosin Palpitations   Valsartan  Other (See Comments)    Made patient feel tired and no energy   Warfarin And Related Palpitations    Fast heart beat, went down hill, felt like he was dying    Current Facility-Administered Medications  Medication Dose Route Frequency Provider Last Rate Last Admin   0.9 %  sodium chloride   infusion   Intravenous Continuous Arleen Lacer, MD       0.9 %  sodium chloride  infusion  250 mL Intravenous PRN Arleen Lacer, MD       acetaminophen  (TYLENOL ) tablet 1,000 mg  1,000 mg Oral Q6H PRN Arleen Lacer, MD       acetaminophen  (TYLENOL ) tablet 650 mg  650 mg Oral Q4H PRN Arleen Lacer, MD       [START ON 01/07/2024] aspirin  EC tablet 81 mg  81 mg Oral Daily Arleen Lacer, MD       [START ON  01/07/2024] DULoxetine  (CYMBALTA ) DR capsule 60 mg  60 mg Oral Daily Arleen Lacer, MD       furosemide  (LASIX ) tablet 40 mg  40 mg Oral Daily Arleen Lacer, MD       hydrALAZINE (APRESOLINE) injection 10 mg  10 mg Intravenous Q20 Min PRN Arleen Lacer, MD       labetalol  (NORMODYNE ) injection 10 mg  10 mg Intravenous Q10 min PRN Arleen Lacer, MD       [START ON 01/07/2024] losartan  (COZAAR ) tablet 100 mg  100 mg Oral Daily Arleen Lacer, MD       [START ON 01/07/2024] metoprolol  succinate (TOPROL -XL) 24 hr tablet 50 mg  50 mg Oral Daily Arleen Lacer, MD       ondansetron  (ZOFRAN ) injection 4 mg  4 mg Intravenous Q6H PRN Arleen Lacer, MD       sodium chloride  flush (NS) 0.9 % injection 3 mL  3 mL Intravenous Q12H Arleen Lacer, MD       sodium chloride  flush (NS) 0.9 % injection 3 mL  3 mL Intravenous PRN Arleen Lacer, MD        Medications Prior to Admission  Medication Sig Dispense Refill Last Dose/Taking   acetaminophen  (TYLENOL ) 500 MG tablet Take 1,000 mg by mouth every 6 (six) hours as needed for moderate pain (pain score 4-6).   Taking As Needed   aspirin  EC 81 MG tablet Take 81 mg by mouth daily. Swallow whole.   01/06/2024 at  4:00 AM   DULoxetine  (CYMBALTA ) 60 MG capsule TAKE 1 CAPSULE BY MOUTH EVERY DAY 90 capsule 1 01/06/2024 at  4:15 AM   hydrochlorothiazide  (HYDRODIURIL ) 25 MG tablet Take 1 tablet (25 mg total) by mouth daily. 90 tablet 3 Past Week   losartan  (COZAAR ) 100 MG tablet Take 1 tablet (100 mg total) by mouth daily. 90 tablet 3  01/06/2024 at  4:00 AM   metoprolol  succinate (TOPROL -XL) 50 MG 24 hr tablet Take 1 tablet (50 mg total) by mouth daily. 90 tablet 3 01/06/2024 at  4:00 AM    Family History  Problem Relation Age of Onset   Breast cancer Mother    Diabetes Mother    Prostate cancer Father    Colon cancer Neg Hx    Colon polyps Neg Hx    Esophageal cancer Neg Hx    Rectal cancer Neg Hx    Stomach cancer Neg Hx    Ulcerative colitis Neg Hx    Crohn's disease Neg Hx    Review of Systems:    Cardiac Review of Systems: Y or  [    ]= no  Chest Pain [  N  ]  Resting SOB [  Sometimes ] Exertional SOB  [  Y]  Pedal Edema [ N  ]    Syncope  [ N ]     General Review of Systems: [Y] = yes [ N ]=no Constitional: fatigue [ Y ]; nausea [ N ]; fever [  N]; or chills [ N ]                                                               Dental: Last Dentist visit: A long time ago  Eye :  diplopia [  N ];  Resp: cough Discordia.Diesel  ];  wheezing[ N ];  hemoptysis[ N ];  GI:   vomiting[ N ];  melena[ N ];  hematochezia [ N ];  GU: hematuria[ N ];                Skin: rash, swelling[ N ];,  peripheral edema[ N ];   Musculosketetal: myalgias[ With taking Crestor ];  generalized body aches [Y]  Heme/Lymph: anemia[N];  Neuro: TIA[ possible in past ];  stroke[ N ]; difficulty walking[  N];  Endocrine: diabetes[  Y];                 Physical Exam: BP (!) 140/84   Pulse (!) 0   Temp 98 F (36.7 C) (Oral)   Resp 17   Ht 6' (1.829 m)   Wt 117.5 kg   SpO2 98%   BMI 35.13 kg/m    General appearance: alert, cooperative, and no distress Head: Normocephalic, without obvious abnormality, atraumatic Neck: no carotid bruit, no JVD, and supple, symmetrical, trachea midline Resp: clear to auscultation bilaterally Cardio: RRR, no murmur GI: Soft, obese, non tender, bowel sounds present Extremities: No LE edema. Palpable DP/PT bilaterally Neurologic: Grossly normal  Diagnostic Studies & Laboratory data:     Recent Radiology  Findings:   CARDIAC CATHETERIZATION Result Date: 01/06/2024 Images from the original result were not included.   1st Diag lesion is 90% stenosed.   Prox LAD to Mid LAD lesion is 60% stenosed.  Mid LAD lesion is 70% stenosed. Mid LAD to Dist LAD lesion is 90% stenosed.  (Long segment of combined tandem lesions)   LPAV lesion is 80% stenosed.   Prox RCA-1 lesion is 20% stenosed.   Prox RCA-2 lesion is 95% stenosed (discrete eccentric/ulcerated).  Dist RCA lesion is 80% stenosed.   Hemodynamic findings consistent with mild pulmonary hypertension (mostly WHO Class III related to DHF)). Dominance: Right. POSTOP CATH FINDINGS Severe multivessel CAD: Notably proximal RCA 95% ulcerated stenosis as culprit for stress test followed by 80% distal; long segment in the LAD after 1st Diag with 60%, 70% and 90% stenoses Prox 1st Diag with 90% stenosis, previously placed AV groove circumflex stent has 80% ISR (not target for bypass) By catheterization, the aortic valve gradient is not significant. Moderately elevated LVEDP and PCWP of 23-24 mmHg suggestive of Diastolic Heart Failure RECOMMENDATIONS Based on the patient having progressive angina class III-IV with some resting symptoms and now documented severe three-vessel disease, recommend admission to inpatient for inpatient CVTS consultation.   Anticipated discharge date to be determined. Will place on IV heparin  based on unstable presentation and unstable plaque in the RCA. Will check 2D echocardiogram, especially in light of his previously documented moderate AS He has been statin intolerant, will need to address lipid management on discharge.   Will home BP meds, but will replace HCTZ with p.o. Lasix   Randene Bustard, MD    Dominance: Right.  I have independently reviewed the above radiologic studies and discussed with the patient   Recent Lab Findings: Lab Results  Component Value Date   WBC 9.6 01/01/2024   HGB 14.3 01/01/2024   HCT 41.9 01/01/2024   PLT 261  01/01/2024   GLUCOSE 105 (H) 01/06/2024   CHOL 172 02/11/2023   TRIG 173 (H) 02/11/2023   HDL 32 (L) 02/11/2023   LDLCALC 105 (H) 02/11/2023   ALT 32 07/08/2023   AST 28 07/08/2023   NA 137 01/06/2024  K 3.9 01/06/2024   CL 106 01/06/2024   CREATININE 0.89 01/06/2024   BUN 12 01/06/2024   CO2 23 01/06/2024   TSH 5.120 (H) 02/11/2023   INR 1.0 02/10/2023   HGBA1C 6.2 (H) 02/10/2023   Assessment / Plan:   Coronary artery disease-Heparin  to be started. Per catheterization, has disease in the LAD, Diagonal, and proximal RCA. Will order duplex carotid US  as part of pre op work up Moderate aortic stenosis-Aortic valve mean gradient measures 22.0 based on echocardiogram done in July 2024. Repeat echocardiogram ordered;await result. By catheterization, the aortic valve gradient is not significant  Dr. Luna Salinas to evaluate films and determine if surgery (CBAG, +/- AVR) is his best option History of diabetes mellitus-Diet controlled and HGA1C 6.2 History of hypertension-On Losartan  100 mg daily and Toprol  XL 50 mg daily History of hyperlipidemia-has allergy to Crestor (myalgias) and refused to consider non statin therapies  History of GERD History of obesity-BMI 35.13.  I  spent 60 minutes counseling the patient face to face.   Garrie Elenes PA-C 01/06/2024 9:39 AM  Patient seen and examined, records and cath and echo images reviewed, agree with above.  73 yo man with multiple CRF presents with new onset angina and has severe 3 vessel CAD.  CABG indicated for survival benefit and relief of symptoms.  Has moderate AS by echo, but no significant gradient at cath.  Given poor dentition, I think probably best to not replace aortic valve at this time, unless intra-op issues indicate otherwise.  Can have TAVR when valve becomes tight enough to warrant intervention.  I discussed the general nature of the procedure, including the need for general anesthesia, the incisions to be used,  the use of cardiopulmonary bypass, and the use of temporary pacemaker wires and drainage tubes postoperatively with Mr. Trickel and his wife.  We discussed the expected hospital stay, overall recovery and short and long term outcomes. I informed them of the indications, risks, benefits and alternatives.   He understands the risks include, but are not limited to death, stroke, MI, DVT/PE, bleeding, possible need for transfusion, infections, cardiac arrhythmias, as well as other organ system dysfunction including respiratory, renal, or GI complications.    If AVR necessary will use a tissue valve to avoid need for life-long anticoagulation.  Plan CABG, possible AVR on Friday 6/13

## 2024-01-06 NOTE — Consult Note (Cosign Needed)
 [address]       [place] [address]             [phone]        [facility] Medical Record #[id number] Date of Birth: [birth year]  Referring: Dr. Addie Holstein, MD Primary Care: Elvira Hammersmith, MD Primary Cardiologist:Mihai Croitoru, MD  Chief Complaint:   Numbness in left arm;dyspnea, mostly exertional but occasionally at rest Reason for consultation: Coronary artery disease, mild aortic stenosis   History of Present Illness:     This is a 73 year old male with a past medical history of diet controlled diabetes mellitus, CAD (DES stent to Circumflex 2008), hypertension, hyperlipidemia (statin intolerant), obesity, GERD, possible TIA (last year, per wife), and fibromyalgia who Saw Dr. Abel Hoe on 12/18/2023 because of complaints of dyspnea with exertion. He had a drug eluting stent placed in his Circumflex in 2008. Echo July 2024 with LVEF=60-65%, mild aortic stenosis, aortic valve peak gradient  measures 41.7 mmHg, and aortic valve area, by VTI measures 1.15 cm. He had a CT cardiac perfusion scan done 12/30/2023 which showed inferior wall perfusion defect c/w ischemia. He presented today for a cardiac catheterization. Results showed mid LAD with a 70% stenosis, mid LAD to distal LAD with a 90% stenosis, first Diagonal with a 90% stenosis, proximal RCA 2 with a 95% stenosis (discrete eccentric/ulcerated), distal RCA lesion is 80% stenosed, and mild pulmonary hypertension. Echocardiogram has been ordered but not taken yet. Cardiothoracic consultation was requested. At the time of my exam, patient denies shortness of breath or chest pain. He does complaint of body hurting all over, but has had for awhile and has a history of fibromyalgia.  Patient states he is active. He mows lawn, uses weed eater, goes fishing,etc. He is married. His wife and daughter were at the bedside during the consultation and questions were answered accordingly.   Current  Activity/ Functional Status: Patient is independent with mobility/ambulation, transfers, ADL's, IADL's.   Zubrod Score: At the time of surgery this patient's most appropriate activity status/level should be described as: []     0    Normal activity, no symptoms [x]     1    Restricted in physical strenuous activity but ambulatory, able to do out light work []     2    Ambulatory and capable of self care, unable to do work activities, up and about more than 50% of the time                            []     3    Only limited self care, in bed greater than 50% of waking hours []     4    Completely disabled, no self care, confined to bed or chair []     5    Moribund  Past Medical History:  Diagnosis Date   Adenomatous colon polyp 03/2005   Anxiety    CAD S/P percutaneous coronary angioplasty 2008   PCI to AVG Cx with a Promus DES 2.5 mm x 8 mm   Cataract    BILATERAL,REMOVED   Degenerative joint disease    Diabetes mellitus without complication (HCC)    diet controlled,PT.DENIES UPDATED 04/10/22   Diverticulosis    Fatty liver 2016   pt denies   Fibromyalgia    GERD (gastroesophageal reflux disease)    Heart murmur    Phreesia 07/29/2020  Hyperlipidemia    statin intolerant   Hypertension    Internal hemorrhoids    Obesity, Class II, BMI 35-39.9    Pneumonia 2016   PONV (postoperative nausea and vomiting)     Past Surgical History:  Procedure Laterality Date   CHOLECYSTECTOMY N/A 02/18/2013   Procedure: LAPAROSCOPIC CHOLECYSTECTOMY WITH INTRAOPERATIVE CHOLANGIOGRAM;  Surgeon: Azucena Bollard, MD;  Location: [facility];  Service: General;  Laterality: N/A;   COLONOSCOPY     COLONOSCOPY  05/27/2022   CORONARY ANGIOPLASTY WITH STENT PLACEMENT  04/30/2007   AVG Cx 95% -> DES PCI with Promus DES 2.5 mm x 8 mm;had 60-70% lesion to RCA   DOPPLER ECHOCARDIOGRAPHY  05/27/2002   [facility]normal EF 55-66%,   FOOT SURGERY Right    x2 fascia   HEEL SPUR SURGERY Right    HEMORRHOID  SURGERY     Holter Monitor  04/07/2007   sinus tachy.;   JOINT REPLACEMENT N/A    [employer]reesia 07/29/2020   KNEE ARTHROSCOPY  03/27/2012   Procedure: ARTHROSCOPY KNEE;  Surgeon: Aurther Blue, MD;  Location: [facility];  Service: Orthopedics;  Laterality: Left;   NM MYOCAR PERF WALL MOTION  12/19/2011   EXERCISED FOR 8-1/2 MINUTES RECHING 10 METABOLIC EQUIVALENTS. NO EVIDENCE  OF ISCHEMIA OR INFARCTION . EF 71%   POLYPECTOMY     renal dopplers  04/08/2007   relatively normal   TOTAL HIP ARTHROPLASTY Right 03/16/2018   Procedure: RIGHT TOTAL HIP ARTHROPLASTY ANTERIOR APPROACH;  Surgeon: Liliane Rei, MD;  Location: [facility];  Service: Orthopedics;  Laterality: Right;   TOTAL HIP ARTHROPLASTY Left about 10 to 12 years ago   TOTAL KNEE ARTHROPLASTY Left 09/04/2012   Procedure: TOTAL KNEE ARTHROPLASTY;  Surgeon: Aurther Blue, MD;  Location: [facility];  Service: Orthopedics;  Laterality: Left;   TOTAL KNEE ARTHROPLASTY Right 01/24/2020   Procedure: TOTAL KNEE ARTHROPLASTY;  Surgeon: Liliane Rei, MD;  Location: [facility];  Service: Orthopedics;  Laterality: Right;   Vitrectomy Bilateral cataract surgery  Social History   Tobacco Use  Smoking Status Never   Passive exposure: Past (PARENTS SMOKED)  Smokeless Tobacco Never    Social History   Substance and Sexual Activity  Alcohol Use No   Family History: Both mother and father are deceased and were in their 37's Brother deceased and had DM, heart issues  Allergies  Allergen Reactions   Amlodipine  Swelling    Patient had severe swelling   Crestor [Rosuvastatin] Other (See Comments)    myalgias   Silodosin Palpitations   Valsartan  Other (See Comments)    Made patient feel tired and no energy   Warfarin And Related Palpitations    Fast heart beat, went down hill, felt like he was dying    Current Facility-Administered Medications  Medication Dose Route Frequency Provider Last Rate Last Admin   0.9 %  sodium chloride   infusion   Intravenous Continuous Arleen Lacer, MD       0.9 %  sodium chloride  infusion  250 mL Intravenous PRN Arleen Lacer, MD       acetaminophen  (TYLENOL ) tablet 1,000 mg  1,000 mg Oral Q6H PRN Arleen Lacer, MD       acetaminophen  (TYLENOL ) tablet 650 mg  650 mg Oral Q4H PRN Arleen Lacer, MD       [START ON 01/07/2024] aspirin  EC tablet 81 mg  81 mg Oral Daily Arleen Lacer, MD       [START ON  01/07/2024] DULoxetine  (CYMBALTA ) DR capsule 60 mg  60 mg Oral Daily Arleen Lacer, MD       furosemide  (LASIX ) tablet 40 mg  40 mg Oral Daily Arleen Lacer, MD       hydrALAZINE (APRESOLINE) injection 10 mg  10 mg Intravenous Q20 Min PRN Arleen Lacer, MD       labetalol  (NORMODYNE ) injection 10 mg  10 mg Intravenous Q10 min PRN Arleen Lacer, MD       [START ON 01/07/2024] losartan  (COZAAR ) tablet 100 mg  100 mg Oral Daily Arleen Lacer, MD       [START ON 01/07/2024] metoprolol  succinate (TOPROL -XL) 24 hr tablet 50 mg  50 mg Oral Daily Arleen Lacer, MD       ondansetron  (ZOFRAN ) injection 4 mg  4 mg Intravenous Q6H PRN Arleen Lacer, MD       sodium chloride  flush (NS) 0.9 % injection 3 mL  3 mL Intravenous Q12H Arleen Lacer, MD       sodium chloride  flush (NS) 0.9 % injection 3 mL  3 mL Intravenous PRN Arleen Lacer, MD        Medications Prior to Admission  Medication Sig Dispense Refill Last Dose/Taking   acetaminophen  (TYLENOL ) 500 MG tablet Take 1,000 mg by mouth every 6 (six) hours as needed for moderate pain (pain score 4-6).   Taking As Needed   aspirin  EC 81 MG tablet Take 81 mg by mouth daily. Swallow whole.   01/06/2024 at  4:00 AM   DULoxetine  (CYMBALTA ) 60 MG capsule TAKE 1 CAPSULE BY MOUTH EVERY DAY 90 capsule 1 01/06/2024 at  4:15 AM   hydrochlorothiazide  (HYDRODIURIL ) 25 MG tablet Take 1 tablet (25 mg total) by mouth daily. 90 tablet 3 Past Week   losartan  (COZAAR ) 100 MG tablet Take 1 tablet (100 mg total) by mouth daily. 90 tablet 3  01/06/2024 at  4:00 AM   metoprolol  succinate (TOPROL -XL) 50 MG 24 hr tablet Take 1 tablet (50 mg total) by mouth daily. 90 tablet 3 01/06/2024 at  4:00 AM    Family History  Problem Relation Age of Onset   Breast cancer Mother    Diabetes Mother    Prostate cancer Father    Colon cancer Neg Hx    Colon polyps Neg Hx    Esophageal cancer Neg Hx    Rectal cancer Neg Hx    Stomach cancer Neg Hx    Ulcerative colitis Neg Hx    Crohn's disease Neg Hx    Review of Systems:    Cardiac Review of Systems: Y or  [    ]= no  Chest Pain [  N  ]  Resting SOB [  Sometimes ] Exertional SOB  [  Y]  Pedal Edema [ N  ]    Syncope  [ N ]     General Review of Systems: [Y] = yes [ N ]=no Constitional: fatigue [ Y ]; nausea [ N ]; fever [  N]; or chills [ N ]                                                               Dental: Last Dentist visit: A long time ago  Eye :  diplopia [  N ];  Resp: cough Discordia.Diesel  ];  wheezing[ N ];  hemoptysis[ N ];  GI:   vomiting[ N ];  melena[ N ];  hematochezia [ N ];  GU: hematuria[ N ];                Skin: rash, swelling[ N ];,  peripheral edema[ N ];   Musculosketetal: myalgias[ With taking Crestor ];  generalized body aches [Y]  Heme/Lymph: anemia[N];  Neuro: TIA[ possible in past ];  stroke[ N ]; difficulty walking[  N];  Endocrine: diabetes[  Y];                 Physical Exam: BP (!) 140/84   Pulse (!) 0   Temp 98 F (36.7 C) (Oral)   Resp 17   Ht 6' (1.829 m)   Wt 117.5 kg   SpO2 98%   BMI 35.13 kg/m    General appearance: alert, cooperative, and no distress Head: Normocephalic, without obvious abnormality, atraumatic Neck: no carotid bruit, no JVD, and supple, symmetrical, trachea midline Resp: clear to auscultation bilaterally Cardio: RRR, no murmur GI: Soft, obese, non tender, bowel sounds present Extremities: No LE edema. Palpable DP/PT bilaterally Neurologic: Grossly normal  Diagnostic Studies & Laboratory data:     Recent Radiology  Findings:   CARDIAC CATHETERIZATION Result Date: 01/06/2024 Images from the original result were not included.   1st Diag lesion is 90% stenosed.   Prox LAD to Mid LAD lesion is 60% stenosed.  Mid LAD lesion is 70% stenosed. Mid LAD to Dist LAD lesion is 90% stenosed.  (Long segment of combined tandem lesions)   LPAV lesion is 80% stenosed.   Prox RCA-1 lesion is 20% stenosed.   Prox RCA-2 lesion is 95% stenosed (discrete eccentric/ulcerated).  Dist RCA lesion is 80% stenosed.   Hemodynamic findings consistent with mild pulmonary hypertension (mostly WHO Class III related to DHF)). Dominance: Right. POSTOP CATH FINDINGS Severe multivessel CAD: Notably proximal RCA 95% ulcerated stenosis as culprit for stress test followed by 80% distal; long segment in the LAD after 1st Diag with 60%, 70% and 90% stenoses Prox 1st Diag with 90% stenosis, previously placed AV groove circumflex stent has 80% ISR (not target for bypass) By catheterization, the aortic valve gradient is not significant. Moderately elevated LVEDP and PCWP of 23-24 mmHg suggestive of Diastolic Heart Failure RECOMMENDATIONS Based on the patient having progressive angina class III-IV with some resting symptoms and now documented severe three-vessel disease, recommend admission to inpatient for inpatient CVTS consultation.   Anticipated discharge date to be determined. Will place on IV heparin  based on unstable presentation and unstable plaque in the RCA. Will check 2D echocardiogram, especially in light of his previously documented moderate AS He has been statin intolerant, will need to address lipid management on discharge.   Will home BP meds, but will replace HCTZ with p.o. Lasix   Randene Bustard, MD    Dominance: Right.  I have independently reviewed the above radiologic studies and discussed with the patient   Recent Lab Findings: Lab Results  Component Value Date   WBC 9.6 01/01/2024   HGB 14.3 01/01/2024   HCT 41.9 01/01/2024   PLT 261  01/01/2024   GLUCOSE 105 (H) 01/06/2024   CHOL 172 02/11/2023   TRIG 173 (H) 02/11/2023   HDL 32 (L) 02/11/2023   LDLCALC 105 (H) 02/11/2023   ALT 32 07/08/2023   AST 28 07/08/2023   NA 137 01/06/2024  K 3.9 01/06/2024   CL 106 01/06/2024   CREATININE 0.89 01/06/2024   BUN 12 01/06/2024   CO2 23 01/06/2024   TSH 5.120 (H) 02/11/2023   INR 1.0 02/10/2023   HGBA1C 6.2 (H) 02/10/2023   Assessment / Plan:   Coronary artery disease-Heparin  to be started. Per catheterization, has disease in the LAD, Diagonal, and proximal RCA. Will order duplex carotid US  as part of pre op work up Moderate aortic stenosis-Aortic valve mean gradient measures 22.0 based on echocardiogram done in July 2024. Repeat echocardiogram ordered;await result. By catheterization, the aortic valve gradient is not significant  Dr. Luna Salinas to evaluate films and determine if surgery (CBAG, +/- AVR) is his best option History of diabetes mellitus-Diet controlled and HGA1C 6.2 History of hypertension-On Losartan  100 mg daily and Toprol  XL 50 mg daily History of hyperlipidemia-has allergy to Crestor (myalgias) and refused to consider non statin therapies  History of GERD History of obesity-BMI 35.13.  I  spent 60 minutes counseling the patient face to face.   Garrie Elenes PA-C 01/06/2024 9:39 AM  Patient seen and examined, records and cath and echo images reviewed, agree with above.  73 yo man with multiple CRF presents with new onset angina and has severe 3 vessel CAD.  CABG indicated for survival benefit and relief of symptoms.  Has moderate AS by echo, but no significant gradient at cath.  Given poor dentition, I think probably best to not replace aortic valve at this time, unless intra-op issues indicate otherwise.  Can have TAVR when valve becomes tight enough to warrant intervention.  I discussed the general nature of the procedure, including the need for general anesthesia, the incisions to be used,  the use of cardiopulmonary bypass, and the use of temporary pacemaker wires and drainage tubes postoperatively with Mr. Trickel and his wife.  We discussed the expected hospital stay, overall recovery and short and long term outcomes. I informed them of the indications, risks, benefits and alternatives.   He understands the risks include, but are not limited to death, stroke, MI, DVT/PE, bleeding, possible need for transfusion, infections, cardiac arrhythmias, as well as other organ system dysfunction including respiratory, renal, or GI complications.    If AVR necessary will use a tissue valve to avoid need for life-long anticoagulation.  Plan CABG, possible AVR on Friday 6/13

## 2024-01-06 NOTE — Discharge Summary (Deleted)
 [address]       [place] [address]             [phone]        [facility] Medical Record #[id number] Date of Birth: 1950/12/09  Referring: Dr. Addie Holstein, MD Primary Care: Elvira Hammersmith, MD Primary Cardiologist:Mihai Croitoru, MD  Chief Complaint:   Numbness in left arm, exertional dyspnea Reason for consultation: Coronary artery disease, mild aortic stenosis   History of Present Illness:     This is a 73 year old male with a past medical history of diet controlled diabetes mellitus, CAD (DES stent to Circumflex 2008), hypertension, hyperlipidemia (statin intolerant), obesity, GERD, possible TIA (last year, per wife), and fibromyalgia who Saw Dr. Abel Hoe on 12/18/2023 because of complaints of dyspnea with exertion. He had a drug eluting stent placed in his Circumflex in 2008. Echo July 2024 with LVEF=60-65%, mild aortic stenosis, aortic valve peak gradient  measures 41.7 mmHg, and aortic valve area, by VTI measures 1.15 cm. He had a CT cardiac perfusion scan done 12/30/2023 which showed inferior wall perfusion defect c/w ischemia. He presented today for a cardiac catheterization. Results showed mid LAD with a 70% stenosis, mid LAD to distal LAD with a 90% stenosis, first Diagonal with a 90% stenosis, proximal RCA 2 with a 95% stenosis (discrete eccentric/ulcerated), distal RCA lesion is 80% stenosed, and mild pulmonary hypertension. Echocardiogram has been ordered but not taken yet. Cardiothoracic consultation was requested. At the time of my exam, patient denies shortness of breath or chest pain. He does complaint of body hurting all over, but has had for awhile and has a history of fibromyalgia.  Patient states he is active. He mows lawn, uses weed eater, goes fishing,etc. He is married. His wife and daughter were at the bedside during the consultation and questions were answered accordingly.   Current Activity/ Functional  Status: Patient is independent with mobility/ambulation, transfers, ADL's, IADL's.   Zubrod Score: At the time of surgery this patient's most appropriate activity status/level should be described as: []     0    Normal activity, no symptoms [x]     1    Restricted in physical strenuous activity but ambulatory, able to do out light work []     2    Ambulatory and capable of self care, unable to do work activities, up and about more than 50% of the time                            []     3    Only limited self care, in bed greater than 50% of waking hours []     4    Completely disabled, no self care, confined to bed or chair []     5    Moribund  Past Medical History:  Diagnosis Date   Adenomatous colon polyp 03/2005   Anxiety    CAD S/P percutaneous coronary angioplasty 2008   PCI to AVG Cx with a Promus DES 2.5 mm x 8 mm   Cataract    BILATERAL,REMOVED   Degenerative joint disease    Diabetes mellitus without complication (HCC)    diet controlled,PT.DENIES UPDATED 04/10/22   Diverticulosis    Fatty liver 2016   pt denies   Fibromyalgia    GERD (gastroesophageal reflux disease)    Heart murmur    Phreesia 07/29/2020   Hyperlipidemia  statin intolerant   Hypertension    Internal hemorrhoids    Obesity, Class II, BMI 35-39.9    Pneumonia 2016   PONV (postoperative nausea and vomiting)     Past Surgical History:  Procedure Laterality Date   CHOLECYSTECTOMY N/A 02/18/2013   Procedure: LAPAROSCOPIC CHOLECYSTECTOMY WITH INTRAOPERATIVE CHOLANGIOGRAM;  Surgeon: Azucena Bollard, MD;  Location: [facility];  Service: General;  Laterality: N/A;   COLONOSCOPY     COLONOSCOPY  05/27/2022   CORONARY ANGIOPLASTY WITH STENT PLACEMENT  04/30/2007   AVG Cx 95% -> DES PCI with Promus DES 2.5 mm x 8 mm;had 60-70% lesion to RCA   DOPPLER ECHOCARDIOGRAPHY  05/27/2002   [facility]normal EF 55-66%,   FOOT SURGERY Right    x2 fascia   HEEL SPUR SURGERY Right    HEMORRHOID SURGERY     Holter  Monitor  04/07/2007   sinus tachy.;   JOINT REPLACEMENT N/A    Phreesia 07/29/2020   KNEE ARTHROSCOPY  03/27/2012   Procedure: ARTHROSCOPY KNEE;  Surgeon: Aurther Blue, MD;  Location: [facility];  Service: Orthopedics;  Laterality: Left;   NM MYOCAR PERF WALL MOTION  12/19/2011   EXERCISED FOR 8-1/2 MINUTES RECHING 10 METABOLIC EQUIVALENTS. NO EVIDENCE  OF ISCHEMIA OR INFARCTION . EF 71%   POLYPECTOMY     renal dopplers  04/08/2007   relatively normal   TOTAL HIP ARTHROPLASTY Right 03/16/2018   Procedure: RIGHT TOTAL HIP ARTHROPLASTY ANTERIOR APPROACH;  Surgeon: Liliane Rei, MD;  Location: [facility];  Service: Orthopedics;  Laterality: Right;   TOTAL HIP ARTHROPLASTY Left about 10 to 12 years ago   TOTAL KNEE ARTHROPLASTY Left 09/04/2012   Procedure: TOTAL KNEE ARTHROPLASTY;  Surgeon: Aurther Blue, MD;  Location: [facility];  Service: Orthopedics;  Laterality: Left;   TOTAL KNEE ARTHROPLASTY Right 01/24/2020   Procedure: TOTAL KNEE ARTHROPLASTY;  Surgeon: Liliane Rei, MD;  Location: [facility];  Service: Orthopedics;  Laterality: Right;   Vitrectomy Bilateral cataract surgery  Social History   Tobacco Use  Smoking Status Never   Passive exposure: Past (PARENTS SMOKED)  Smokeless Tobacco Never    Social History   Substance and Sexual Activity  Alcohol Use No   Family History: Both mother and father are deceased and were in their 28's Brother deceased and had DM, "heart issues"  Allergies  Allergen Reactions   Amlodipine  Swelling    Patient had severe swelling   Crestor [Rosuvastatin] Other (See Comments)    myalgias   Silodosin Palpitations   Valsartan  Other (See Comments)    Made patient feel tired and no energy   Warfarin And Related Palpitations    Fast heart beat, went down hill, felt like he was dying    Current Facility-Administered Medications  Medication Dose Route Frequency Provider Last Rate Last Admin   0.9 %  sodium chloride  infusion   Intravenous  Continuous Arleen Lacer, MD       0.9 %  sodium chloride  infusion  250 mL Intravenous PRN Arleen Lacer, MD       acetaminophen  (TYLENOL ) tablet 1,000 mg  1,000 mg Oral Q6H PRN Arleen Lacer, MD       acetaminophen  (TYLENOL ) tablet 650 mg  650 mg Oral Q4H PRN Arleen Lacer, MD       [START ON 01/07/2024] aspirin  EC tablet 81 mg  81 mg Oral Daily Arleen Lacer, MD       [START ON 01/07/2024] DULoxetine  (CYMBALTA ) DR  capsule 60 mg  60 mg Oral Daily Arleen Lacer, MD       furosemide  (LASIX ) tablet 40 mg  40 mg Oral Daily Arleen Lacer, MD       hydrALAZINE (APRESOLINE) injection 10 mg  10 mg Intravenous Q20 Min PRN Arleen Lacer, MD       labetalol  (NORMODYNE ) injection 10 mg  10 mg Intravenous Q10 min PRN Arleen Lacer, MD       [START ON 01/07/2024] losartan  (COZAAR ) tablet 100 mg  100 mg Oral Daily Arleen Lacer, MD       [START ON 01/07/2024] metoprolol  succinate (TOPROL -XL) 24 hr tablet 50 mg  50 mg Oral Daily Arleen Lacer, MD       ondansetron  (ZOFRAN ) injection 4 mg  4 mg Intravenous Q6H PRN Arleen Lacer, MD       sodium chloride  flush (NS) 0.9 % injection 3 mL  3 mL Intravenous Q12H Arleen Lacer, MD       sodium chloride  flush (NS) 0.9 % injection 3 mL  3 mL Intravenous PRN Arleen Lacer, MD        Medications Prior to Admission  Medication Sig Dispense Refill Last Dose/Taking   acetaminophen  (TYLENOL ) 500 MG tablet Take 1,000 mg by mouth every 6 (six) hours as needed for moderate pain (pain score 4-6).   Taking As Needed   aspirin  EC 81 MG tablet Take 81 mg by mouth daily. Swallow whole.   01/06/2024 at  4:00 AM   DULoxetine  (CYMBALTA ) 60 MG capsule TAKE 1 CAPSULE BY MOUTH EVERY DAY 90 capsule 1 01/06/2024 at  4:15 AM   hydrochlorothiazide  (HYDRODIURIL ) 25 MG tablet Take 1 tablet (25 mg total) by mouth daily. 90 tablet 3 Past Week   losartan  (COZAAR ) 100 MG tablet Take 1 tablet (100 mg total) by mouth daily. 90 tablet 3 01/06/2024 at  4:00 AM    metoprolol  succinate (TOPROL -XL) 50 MG 24 hr tablet Take 1 tablet (50 mg total) by mouth daily. 90 tablet 3 01/06/2024 at  4:00 AM    Family History  Problem Relation Age of Onset   Breast cancer Mother    Diabetes Mother    Prostate cancer Father    Colon cancer Neg Hx    Colon polyps Neg Hx    Esophageal cancer Neg Hx    Rectal cancer Neg Hx    Stomach cancer Neg Hx    Ulcerative colitis Neg Hx    Crohn's disease Neg Hx    Review of Systems:    Cardiac Review of Systems: Y or  [    ]= no  Chest Pain [  N  ]  Resting SOB [  Sometimes ] Exertional SOB  [  Y]  Pedal Edema [ N  ]    Syncope  [ N ]     General Review of Systems: [Y] = yes [ N ]=no Constitional: fatigue [ Y ]; nausea [ N ]; fever [  N]; or chills [ N ]                                                               Dental: Last Dentist visit: "A long time ago"  Eye : diplopia [  N ];  Resp: cough Discordia.Diesel  ];  wheezing[ N ];  hemoptysis[ N ];  GI:   vomiting[ N ];  melena[ N ];  hematochezia [ N ];  GU: hematuria[ N ];                Skin: rash, swelling[ N ];,  peripheral edema[ N ];   Musculosketetal: myalgias[ With taking Crestor ];  generalized body aches [Y]  Heme/Lymph: anemia[N];  Neuro: TIA[ possible in past ];  stroke[ N ]; difficulty walking[  N];  Endocrine: diabetes[  Y];                 Physical Exam: BP (!) 140/84   Pulse (!) 0   Temp 98 F (36.7 C) (Oral)   Resp 17   Ht 6' (1.829 m)   Wt 117.5 kg   SpO2 98%   BMI 35.13 kg/m    General appearance: alert, cooperative, and no distress Head: Normocephalic, without obvious abnormality, atraumatic Neck: no carotid bruit, no JVD, and supple, symmetrical, trachea midline Resp: clear to auscultation bilaterally Cardio: RRR, no murmur GI: Soft, obese, non tender, bowel sounds present Extremities: No LE edema. Palpable DP/PT bilaterally Neurologic: Grossly normal  Diagnostic Studies & Laboratory data:     Recent Radiology Findings:   CARDIAC  CATHETERIZATION Result Date: 01/06/2024 Images from the original result were not included.   1st Diag lesion is 90% stenosed.   Prox LAD to Mid LAD lesion is 60% stenosed.  Mid LAD lesion is 70% stenosed. Mid LAD to Dist LAD lesion is 90% stenosed.  (Long segment of combined tandem lesions)   LPAV lesion is 80% stenosed.   Prox RCA-1 lesion is 20% stenosed.   Prox RCA-2 lesion is 95% stenosed (discrete eccentric/ulcerated).  Dist RCA lesion is 80% stenosed.   Hemodynamic findings consistent with mild pulmonary hypertension (mostly WHO Class III related to DHF)). Dominance: Right. POSTOP CATH FINDINGS Severe multivessel CAD: Notably proximal RCA 95% ulcerated stenosis as culprit for stress test followed by 80% distal; long segment in the LAD after 1st Diag with 60%, 70% and 90% stenoses Prox 1st Diag with 90% stenosis, previously placed AV groove circumflex stent has 80% ISR (not target for bypass) By catheterization, the aortic valve gradient is not significant. Moderately elevated LVEDP and PCWP of 23-24 mmHg suggestive of Diastolic Heart Failure RECOMMENDATIONS Based on the patient having progressive angina class III-IV with some resting symptoms and now documented severe three-vessel disease, recommend admission to inpatient for inpatient CVTS consultation.   Anticipated discharge date to be determined. Will place on IV heparin  based on unstable presentation and unstable plaque in the RCA. Will check 2D echocardiogram, especially in light of his previously documented moderate AS He has been statin intolerant, will need to address lipid management on discharge.   Will home BP meds, but will replace HCTZ with p.o. Lasix   Randene Bustard, MD    Dominance: Right.  I have independently reviewed the above radiologic studies and discussed with the patient   Recent Lab Findings: Lab Results  Component Value Date   WBC 9.6 01/01/2024   HGB 14.3 01/01/2024   HCT 41.9 01/01/2024   PLT 261 01/01/2024   GLUCOSE  105 (H) 01/06/2024   CHOL 172 02/11/2023   TRIG 173 (H) 02/11/2023   HDL 32 (L) 02/11/2023   LDLCALC 105 (H) 02/11/2023   ALT 32 07/08/2023   AST 28 07/08/2023   NA 137 01/06/2024   K 3.9  01/06/2024   CL 106 01/06/2024   CREATININE 0.89 01/06/2024   BUN 12 01/06/2024   CO2 23 01/06/2024   TSH 5.120 (H) 02/11/2023   INR 1.0 02/10/2023   HGBA1C 6.2 (H) 02/10/2023   Assessment / Plan:   Coronary artery disease-Heparin  to be started. Per catheterization, has disease in the LAD, Diagonal, and proximal RCA Moderate aortic stenosis-Aortic valve mean gradient measures 22.0 based on echocardiogram done in July 2024. Repeat echocardiogram ordered;await result. By catheterization, the aortic valve gradient is not significant  Dr. Luna Salinas to evaluate films and determine if surgery (CBAG, +/- AVR) is his best option History of diabetes mellitus-Diet controlled and HGA1C 6.2 History of hypertension-On Losartan  100 mg daily and Toprol  XL 50 mg daily History of hyperlipidemia-has allergy to Crestor (myalgias) and refused to consider non statin therapies  History of GERD History of obesity-BMI 35.13.  I  spent 60 minutes counseling the patient face to face.   Rece Zechman PA-C 01/06/2024 9:39 AM

## 2024-01-06 NOTE — Plan of Care (Signed)

## 2024-01-07 ENCOUNTER — Encounter (HOSPITAL_COMMUNITY): Payer: Self-pay | Admitting: Cardiology

## 2024-01-07 ENCOUNTER — Inpatient Hospital Stay (HOSPITAL_COMMUNITY)

## 2024-01-07 DIAGNOSIS — I350 Nonrheumatic aortic (valve) stenosis: Secondary | ICD-10-CM

## 2024-01-07 DIAGNOSIS — I25110 Atherosclerotic heart disease of native coronary artery with unstable angina pectoris: Secondary | ICD-10-CM | POA: Diagnosis not present

## 2024-01-07 DIAGNOSIS — Z01810 Encounter for preprocedural cardiovascular examination: Secondary | ICD-10-CM | POA: Diagnosis not present

## 2024-01-07 LAB — CBC
HCT: 37.3 % — ABNORMAL LOW (ref 39.0–52.0)
Hematocrit: 41.9 % (ref 37.5–51.0)
Hemoglobin: 14.3 g/dL (ref 13.0–17.7)
Hemoglobin: 12.7 g/dL — ABNORMAL LOW (ref 13.0–17.0)
MCH: 30.1 pg (ref 26.6–33.0)
MCH: 29.7 pg (ref 26.0–34.0)
MCHC: 34.1 g/dL (ref 31.5–35.7)
MCHC: 34 g/dL (ref 30.0–36.0)
MCV: 88 fL (ref 79–97)
MCV: 87.4 fL (ref 80.0–100.0)
Platelets: 261 10*3/uL (ref 150–450)
Platelets: 190 10*3/uL (ref 150–400)
RBC: 4.75 x10E6/uL (ref 4.14–5.80)
RBC: 4.27 MIL/uL (ref 4.22–5.81)
RDW: 13.8 % (ref 11.6–15.4)
RDW: 13.9 % (ref 11.5–15.5)
WBC: 9.6 10*3/uL (ref 3.4–10.8)
WBC: 8.3 10*3/uL (ref 4.0–10.5)
nRBC: 0 % (ref 0.0–0.2)

## 2024-01-07 LAB — ECHOCARDIOGRAM COMPLETE
AR max vel: 1.53 cm2
AV Area VTI: 1.64 cm2
AV Area mean vel: 1.42 cm2
AV Mean grad: 21 mmHg
AV Peak grad: 42.5 mmHg
Ao pk vel: 3.26 m/s
Area-P 1/2: 3.13 cm2
Calc EF: 72.8 %
Height: 76 in
S' Lateral: 2.9 cm
Single Plane A2C EF: 72.8 %
Single Plane A4C EF: 73.5 %
Weight: 4261.05 [oz_av]

## 2024-01-07 LAB — PROTIME-INR
INR: 1 (ref 0.8–1.2)
Prothrombin Time: 13.7 s (ref 11.4–15.2)

## 2024-01-07 LAB — HEMOGLOBIN A1C
Hgb A1c MFr Bld: 5.9 % — ABNORMAL HIGH (ref 4.8–5.6)
Mean Plasma Glucose: 122.63 mg/dL

## 2024-01-07 LAB — URINALYSIS, ROUTINE W REFLEX MICROSCOPIC
Bilirubin Urine: NEGATIVE
Glucose, UA: NEGATIVE mg/dL
Hgb urine dipstick: NEGATIVE
Ketones, ur: NEGATIVE mg/dL
Leukocytes,Ua: NEGATIVE
Nitrite: NEGATIVE
Protein, ur: NEGATIVE mg/dL
Specific Gravity, Urine: 1.015 (ref 1.005–1.030)
pH: 7 (ref 5.0–8.0)

## 2024-01-07 LAB — SURGICAL PCR SCREEN
MRSA, PCR: NEGATIVE
Staphylococcus aureus: NEGATIVE

## 2024-01-07 LAB — HEPARIN LEVEL (UNFRACTIONATED)
Heparin Unfractionated: 0.35 [IU]/mL (ref 0.30–0.70)
Heparin Unfractionated: 0.25 [IU]/mL — ABNORMAL LOW (ref 0.30–0.70)

## 2024-01-07 LAB — BASIC METABOLIC PANEL WITH GFR
BUN/Creatinine Ratio: 17 (ref 10–24)
BUN: 14 mg/dL (ref 8–27)
CO2: 21 mmol/L (ref 20–29)
Calcium: 10.1 mg/dL (ref 8.6–10.2)
Chloride: 99 mmol/L (ref 96–106)
Creatinine, Ser: 0.83 mg/dL (ref 0.76–1.27)
Glucose: 111 mg/dL — ABNORMAL HIGH (ref 70–99)
Potassium: 3.6 mmol/L (ref 3.5–5.2)
Sodium: 139 mmol/L (ref 134–144)
eGFR: 93 mL/min/{1.73_m2} (ref >59)

## 2024-01-07 LAB — TYPE AND SCREEN
ABO/RH(D): B POS
Antibody Screen: NEGATIVE

## 2024-01-07 LAB — APTT: aPTT: 54 s — ABNORMAL HIGH (ref 24–36)

## 2024-01-07 MED ORDER — CHLORHEXIDINE GLUCONATE CLOTH 2 % EX PADS
6.0000 | MEDICATED_PAD | Freq: Once | CUTANEOUS | Status: AC
  Administered 2024-01-09: 6 via TOPICAL

## 2024-01-07 MED ORDER — CHLORHEXIDINE GLUCONATE 0.12 % MT SOLN
15.0000 mL | Freq: Once | OROMUCOSAL | Status: AC
Start: 2024-01-08 — End: ?
  Administered 2024-01-09: 15 mL via OROMUCOSAL
  Filled 2024-01-07: qty 15

## 2024-01-07 MED ORDER — CHLORHEXIDINE GLUCONATE CLOTH 2 % EX PADS
6.0000 | MEDICATED_PAD | Freq: Once | CUTANEOUS | Status: AC
  Administered 2024-01-08: 6 via TOPICAL

## 2024-01-07 MED ORDER — METOPROLOL TARTRATE 12.5 MG HALF TABLET
12.5000 mg | ORAL_TABLET | Freq: Once | ORAL | Status: AC
  Administered 2024-01-09: 12.5 mg via ORAL
  Filled 2024-01-07: qty 1

## 2024-01-07 MED ORDER — PERFLUTREN LIPID MICROSPHERE
1.0000 mL | INTRAVENOUS | Status: AC | PRN
  Administered 2024-01-07: 2 mL via INTRAVENOUS

## 2024-01-07 MED ORDER — BISACODYL 5 MG PO TBEC
5.0000 mg | DELAYED_RELEASE_TABLET | Freq: Once | ORAL | Status: AC
  Administered 2024-01-07: 5 mg via ORAL
  Filled 2024-01-07: qty 1

## 2024-01-07 MED ORDER — HYDRALAZINE HCL 25 MG PO TABS
25.0000 mg | ORAL_TABLET | Freq: Three times a day (TID) | ORAL | Status: DC
  Administered 2024-01-07 – 2024-01-08 (×4): 25 mg via ORAL
  Filled 2024-01-07 (×5): qty 1

## 2024-01-07 NOTE — Progress Notes (Signed)
 CARDIAC REHAB PHASE I      Pre-op OHS education including OHS booklet, OHS handout, IS use, mobility importance, home needs at discharge and sternal precautions/move in the tube reviewed. All questions and concerns addressed. Will continue to follow.   Ronny Colas, RN BSN 01/07/2024 12:04 PM

## 2024-01-07 NOTE — Progress Notes (Signed)
 Pre-CABG vascular exams have been completed.   Results can be found under chart review under CV PROC. 01/07/2024 2:26 PM Rodrecus Belsky RVT, RDMS

## 2024-01-07 NOTE — Progress Notes (Signed)
 PHARMACY - ANTICOAGULATION CONSULT NOTE  Pharmacy Consult for Heparin  Indication: chest pain/ACS  Allergies  Allergen Reactions   Amlodipine  Swelling    Patient had severe swelling   Crestor [Rosuvastatin] Other (See Comments)    myalgias   Silodosin Palpitations   Valsartan  Other (See Comments)    Made patient feel tired and no energy   Warfarin And Related Palpitations    Fast heart beat, went down hill, felt like he was dying    Patient Measurements: Height: 6' 4 (193 cm) Weight: 120.8 kg (266 lb 5.1 oz) IBW/kg (Calculated) : 86.8 HEPARIN  DW (KG): 112.2  Vital Signs: Temp: 98.6 F (37 C) (06/11 0803) Temp Source: Oral (06/11 0803) BP: 159/76 (06/11 0803) Pulse Rate: 71 (06/11 0803)  Labs: Recent Labs    01/06/24 0603 01/06/24 1610 01/06/24 0808 01/06/24 0815 01/06/24 2323 01/07/24 0735 01/07/24 1431  HGB  --  11.9*   < > 12.2*  11.9*  --  12.7*  --   HCT  --  35.0*  --  36.0*  35.0*  --  37.3*  --   PLT  --   --   --   --   --  190  --   HEPARINUNFRC  --   --   --   --  0.22* 0.35 0.25*  CREATININE 0.89  --   --   --   --   --   --    < > = values in this interval not displayed.    Estimated Creatinine Clearance: 106.5 mL/min (by C-G formula based on SCr of 0.89 mg/dL).  Assessment: 73 yo male s/p cath with disease in LAD, Diagonal, and proximal RCA. Pharmacy consulted to dose IV heparin  while undergoing TCTS consult. Not on anticoagulants PTA.   -Confirmatory heparin  level 0.25 is below goal on 1650 units/hr.  -Plans noted for tentative CABG +/- AVR on Friday 6/13  Goal of Therapy:  Heparin  level 0.3-0.7 units/ml Monitor platelets by anticoagulation protocol: Yes   Plan:  Increase heparin  IV to 1700 units/hr 8 hour heparin  level Check daily heparin  level and CBC Monitor for signs/symptoms of bleeding  Cecillia Cogan, PharmD Clinical Pharmacist 01/07/2024  4:05 PM

## 2024-01-07 NOTE — Progress Notes (Addendum)
 Rounding Note   Patient Name: Samuel Chambers Date of Encounter: 01/07/2024  [facility] Cardiologist: Luana Rumple, MD   Subjective He still feels the same.  Mild chest heaviness.  Reports his shortness of breath is only with exertion states: he is able to pressure wash his house with few breaks and walk up hills without stopping.  He did not take his Lasix  this morning because he did not want to have to pee.  Scheduled Meds:  aspirin  EC  81 mg Oral Daily   DULoxetine   60 mg Oral Daily   furosemide   40 mg Oral Daily   losartan   100 mg Oral Daily   metoprolol  succinate  50 mg Oral Daily   sodium chloride  flush  3 mL Intravenous Q12H   Continuous Infusions:  sodium chloride      heparin  1,650 Units/hr (01/07/24 0805)   PRN Meds: sodium chloride , acetaminophen , acetaminophen , ondansetron  (ZOFRAN ) IV, sodium chloride  flush   Vital Signs  Vitals:   01/06/24 1950 01/06/24 2327 01/07/24 0437 01/07/24 0803  BP: 139/62 136/70 (!) 151/79 (!) 159/76  Pulse: 68 66 74 71  Resp: 18 18 18 17   Temp: 98.3 F (36.8 C) 97.8 F (36.6 C) 97.8 F (36.6 C) 98.6 F (37 C)  TempSrc: Oral Oral Oral Oral  SpO2: 99% 99% 96% 95%  Weight:      Height:        Intake/Output Summary (Last 24 hours) at 01/07/2024 0942 Last data filed at 01/06/2024 1313 Gross per 24 hour  Intake 240 ml  Output --  Net 240 ml      01/06/2024    1:02 PM 01/06/2024    5:44 AM 01/01/2024    9:53 AM  Last 3 Weights  Weight (lbs) 266 lb 5.1 oz 259 lb 266 lb 12.8 oz  Weight (kg) 120.8 kg 117.482 kg 121.02 kg      Telemetry NSR with occasional PVC HR 56 - Personally Reviewed  Physical Exam GEN: No acute distress, sitting up in bed.   Neck: No JVD Cardiac: RRR, systolic cresendo murmur LUSB 2/6 no rubs or gallops.  Respiratory: Clear to auscultation bilaterally. GI: Soft, nontender, non-distended  MS: No edema; No deformity. RT radial site: dressing clean and dry radial pulse 2+ Neuro:  Nonfocal   Psych: Normal affect   Labs High Sensitivity Troponin:  No results for input(s): TROPONINIHS in the last 720 hours.   Chemistry Recent Labs  Lab 01/01/24 1106 01/06/24 0603 01/06/24 0808 01/06/24 0815  NA 139 137 140 140  141  K 3.6 3.9 4.1 4.0  4.0  CL 99 106  --   --   CO2 21 23  --   --   GLUCOSE 111* 105*  --   --   BUN 14 12  --   --   CREATININE 0.83 0.89  --   --   CALCIUM 10.1 8.5*  --   --   GFRNONAA  --  >60  --   --   ANIONGAP  --  8  --   --     Lipids No results for input(s): CHOL, TRIG, HDL, LABVLDL, LDLCALC, CHOLHDL in the last 168 hours.  Hematology Recent Labs  Lab 01/01/24 1106 01/06/24 0808 01/06/24 0815 01/07/24 0735  WBC 9.6  --   --  8.3  RBC 4.75  --   --  4.27  HGB 14.3 11.9* 12.2*  11.9* 12.7*  HCT 41.9 35.0* 36.0*  35.0* 37.3*  MCV 88  --   --  87.4  MCH 30.1  --   --  29.7  MCHC 34.1  --   --  34.0  RDW 13.8  --   --  13.9  PLT 261  --   --  190   Thyroid  No results for input(s): TSH, FREET4 in the last 168 hours.  BNPNo results for input(s): BNP, PROBNP in the last 168 hours.  DDimer No results for input(s): DDIMER in the last 168 hours.   Radiology  CARDIAC CATHETERIZATION Result Date: 01/06/2024 Images from the original result were not included.   1st Diag lesion is 90% stenosed.   Prox LAD to Mid LAD lesion is 60% stenosed.  Mid LAD lesion is 70% stenosed. Mid LAD to Dist LAD lesion is 90% stenosed.  (Long segment of combined tandem lesions)   LPAV lesion is 80% stenosed.   Prox RCA-1 lesion is 20% stenosed.   Prox RCA-2 lesion is 95% stenosed (discrete eccentric/ulcerated).  Dist RCA lesion is 80% stenosed.   Hemodynamic findings consistent with mild pulmonary hypertension (mostly WHO Class III related to DHF)). Dominance: Right. POSTOP CATH FINDINGS Severe multivessel CAD: Notably proximal RCA 95% ulcerated stenosis as culprit for stress test followed by 80% distal; long segment in the LAD after 1st Diag with  60%, 70% and 90% stenoses Prox 1st Diag with 90% stenosis, previously placed AV groove circumflex stent has 80% ISR (not target for bypass) By catheterization, the aortic valve gradient is not significant. Moderately elevated LVEDP and PCWP of 23-24 mmHg suggestive of Diastolic Heart Failure RECOMMENDATIONS Based on the patient having progressive angina class III-IV with some resting symptoms and now documented severe three-vessel disease, recommend admission to inpatient for inpatient CVTS consultation.   Anticipated discharge date to be determined. Will place on IV heparin  based on unstable presentation and unstable plaque in the RCA. Will check 2D echocardiogram, especially in light of his previously documented moderate AS He has been statin intolerant, will need to address lipid management on discharge.   Will home BP meds, but will replace HCTZ with p.o. Lasix   Randene Bustard, MD   Patient Profile   73 y.o. male with history of CAD, T2DM, HTN, HLD, fibromyalgia, and GERD who was seen as an outpatient on 01/06/24 by Dr. Abel Hoe and was sent for urgent cath for unstable angina.   Assessment & Plan  CAD s/p DES to LCA in 2008 Unstable angina Presented to outpatient follow up with Dr. Abel Hoe on 01/06/24 reporting worsening dyspnea on exertion and angina. Evidence of ischemia seen on PET CT stress test (12/29/33) with possible inferior wall ischemia. He was sent to [facility] for urgent cardiac catheterization.  RHC/LHC on 01/06/24 showed severe multivessel CAD ( proximal RCA 95% ulcerated stenosis with distal RCA 80% stenosis, long segment in LAD after D1 with 60%, 70%, and 90% stenosis, and proximal D1 with 90% stenosis and previously placed AV groove circumflex stent with 80% ISR.   CT surgery has been consulted for coronary bypass graft given disease extent and patient's symptom burden.   -continue heparin  drip -continue ASA 81mg  -hold off starting P2Y12i at this time due to CABG  -continue Toprol  XL  50mg  -see hyperlipidemia below in regards to lipid management.   Hypertension Last blood pressure (159/76) -continue losartan  100 mg, will need to hold prior to surgery when date is set -continue Toprol  XL 50 mg -continue lasix  40 mg, please encourage patient to take medication to optimize for upcoming  surgery -hold PTA hydrochlorothiazide   Chronic diastolic heart failure-new diagnosis Evidence of diastolic heart failure seen during RHC/LHC (elevated LVEDP and PCWP (23-24 mmhg)). Patient does endorse shortness of breath with exertion which could be multifactorial (AS and unstable angina). He reports that is it minimal, he is able to perform house chores without difficulty.  Echo in July 2024 showed EF 60-65% with grade I diastolic dysfunction : Repeat Echo pending.  Denies orthopnea, PND, and peripheral edema  On exam looks euvolemic on exam, though wedge pressures from yesterday are elevated and weight is up (259 -> 266lbs) -continue lasix  40 mg, please encourage patient to take medication to optimize for surgery -continue losartan  100 mg -continue Toprol  XL 50 mg -patient to have CABG, will consider SGLT2i after intervention.   Aortic Stenosis Moderate on last echo in July 2024. Echo pending. Patient undergoing CABG during admission, possible AVR at that time. Per CT surgery.    Hyperlipidemia Statin intolerant. He has been seen in the lipid clinic, he declined [id number].       For questions or updates, please contact [facility] Please consult [email or web address] for contact info under     Signed, Mabel Savage, PA-C  01/07/2024, 9:42 AM     ATTENDING ATTESTATION:  After conducting a review of all available clinical information with the care team, interviewing the patient, and performing a physical exam, I agree with the findings and plan described in this note.   GEN: No acute distress.   HEENT:  MMM, no JVD, no scleral icterus Cardiac: RRR, 2/6 SEM.   Respiratory: Clear to auscultation bilaterally. GI: Soft, nontender, non-distended  MS: No edema; No deformity. Neuro:  Nonfocal  Vasc:  +2 radial pulses  Patient remains chest pain free.  I discussed his elevated filling pressures and that lasix  would be beneficial.  He is willing to now take lasix .  Otherwise continue medical therapy as above.  CABG +/- AVR on Friday.  Appreciate CTS recs.  Alyssa Backbone, MD Pager [phone]

## 2024-01-07 NOTE — Progress Notes (Signed)
 PHARMACY - ANTICOAGULATION CONSULT NOTE  Pharmacy Consult for Heparin  Indication: chest pain/ACS  Allergies  Allergen Reactions   Amlodipine  Swelling    Patient had severe swelling   Crestor [Rosuvastatin] Other (See Comments)    myalgias   Silodosin Palpitations   Valsartan  Other (See Comments)    Made patient feel tired and no energy   Warfarin And Related Palpitations    Fast heart beat, went down hill, felt like he was dying    Patient Measurements: Height: 6' 4 (193 cm) Weight: 120.8 kg (266 lb 5.1 oz) IBW/kg (Calculated) : 86.8 HEPARIN  DW (KG): 112.2  Vital Signs: Temp: 98.6 F (37 C) (06/11 0803) Temp Source: Oral (06/11 0803) BP: 159/76 (06/11 0803) Pulse Rate: 71 (06/11 0803)  Labs: Recent Labs    01/06/24 0603 01/06/24 0808 01/06/24 0808 01/06/24 0815 01/06/24 2323 01/07/24 0735  HGB  --  11.9*   < > 12.2*  11.9*  --  12.7*  HCT  --  35.0*  --  36.0*  35.0*  --  37.3*  PLT  --   --   --   --   --  190  HEPARINUNFRC  --   --   --   --  0.22* 0.35  CREATININE 0.89  --   --   --   --   --    < > = values in this interval not displayed.    Estimated Creatinine Clearance: 106.5 mL/min (by C-G formula based on SCr of 0.89 mg/dL).  Assessment: 73 yo male s/p cath with disease in LAD, Diagonal, and proximal RCA. Pharmacy consulted to dose IV heparin  while undergoing TCTS consult. Not on anticoagulants PTA.   Heparin  level is therapeutic at 0.35 on UFH IV infusion at 1650 units/hour. CBC is stable, no signs of bleeding, no issues with infusion per RN.  Goal of Therapy:  Heparin  level 0.3-0.7 units/ml Monitor platelets by anticoagulation protocol: Yes   Plan:  Continue heparin  1650 units/hr Check confirmatory heparin  level in 6 hrs Check daily heparin  level and CBC Monitor for signs/symptoms of bleeding  Albino Alu, PharmD PGY2 Cardiology Pharmacy Resident 01/07/2024 8:50 AM

## 2024-01-07 NOTE — Progress Notes (Signed)
 PHARMACY - ANTICOAGULATION CONSULT NOTE  Pharmacy Consult for Heparin  Indication: chest pain/ACS  Allergies  Allergen Reactions   Amlodipine  Swelling    Patient had severe swelling   Crestor [Rosuvastatin] Other (See Comments)    myalgias   Silodosin Palpitations   Valsartan  Other (See Comments)    Made patient feel tired and no energy   Warfarin And Related Palpitations    Fast heart beat, went down hill, felt like he was dying    Patient Measurements: Height: 6' 4 (193 cm) Weight: 120.8 kg (266 lb 5.1 oz) IBW/kg (Calculated) : 86.8 HEPARIN  DW (KG): 112.2  Vital Signs: Temp: 97.8 F (36.6 C) (06/10 2327) Temp Source: Oral (06/10 2327) BP: 136/70 (06/10 2327) Pulse Rate: 66 (06/10 2327)  Labs: Recent Labs    01/06/24 0603 01/06/24 0808 01/06/24 0815 01/06/24 2323  HGB  --  11.9* 12.2*  11.9*  --   HCT  --  35.0* 36.0*  35.0*  --   HEPARINUNFRC  --   --   --  0.22*  CREATININE 0.89  --   --   --     Estimated Creatinine Clearance: 106.5 mL/min (by C-G formula based on SCr of 0.89 mg/dL).  Assessment: 73 yo male s/p cath with disease in LAD, Diagonal, and proximal RCA. Pharmacy consulted to dose IV heparin  while undergoing TCTS consult. Not on anticoagulants PTA. CBC wnl.  AM: heparin  level subtherapeutic on 1450 units/hr. Per RN, no signs/symptoms of bleeding or issues with the heparin  gtt running continuously.  Goal of Therapy:  Heparin  level 0.3-0.7 units/ml Monitor platelets by anticoagulation protocol: Yes   Plan:  Increase heparin  to 1650 units/hr (no bolus) Check heparin  level in 8hrs at  Check daily heparin  level and CBC Monitor for signs/symptoms of bleeding  Young Hensen, PharmD, BCPS Clinical Pharmacist 01/07/2024 12:15 AM

## 2024-01-07 NOTE — Progress Notes (Signed)
*  PRELIMINARY RESULTS* Echocardiogram 2D Echocardiogram has been performed.  Samuel Chambers 01/07/2024, 10:39 AM

## 2024-01-08 ENCOUNTER — Other Ambulatory Visit (HOSPITAL_COMMUNITY)

## 2024-01-08 DIAGNOSIS — I25110 Atherosclerotic heart disease of native coronary artery with unstable angina pectoris: Secondary | ICD-10-CM | POA: Diagnosis not present

## 2024-01-08 DIAGNOSIS — I2510 Atherosclerotic heart disease of native coronary artery without angina pectoris: Secondary | ICD-10-CM | POA: Diagnosis not present

## 2024-01-08 LAB — HEPARIN LEVEL (UNFRACTIONATED)
Heparin Unfractionated: 0.41 [IU]/mL (ref 0.30–0.70)
Heparin Unfractionated: 0.33 [IU]/mL (ref 0.30–0.70)
Heparin Unfractionated: 0.39 [IU]/mL (ref 0.30–0.70)

## 2024-01-08 LAB — BASIC METABOLIC PANEL WITH GFR
Anion gap: 11 (ref 5–15)
BUN: 12 mg/dL (ref 8–23)
CO2: 23 mmol/L (ref 22–32)
Calcium: 8.8 mg/dL — ABNORMAL LOW (ref 8.9–10.3)
Chloride: 106 mmol/L (ref 98–111)
Creatinine, Ser: 0.95 mg/dL (ref 0.61–1.24)
GFR, Estimated: >60 mL/min (ref >60)
Glucose, Bld: 119 mg/dL — ABNORMAL HIGH (ref 70–99)
Potassium: 3.8 mmol/L (ref 3.5–5.1)
Sodium: 140 mmol/L (ref 135–145)

## 2024-01-08 LAB — CBC
HCT: 36.8 % — ABNORMAL LOW (ref 39.0–52.0)
Hemoglobin: 12.8 g/dL — ABNORMAL LOW (ref 13.0–17.0)
MCH: 30.1 pg (ref 26.0–34.0)
MCHC: 34.8 g/dL (ref 30.0–36.0)
MCV: 86.6 fL (ref 80.0–100.0)
Platelets: 192 10*3/uL (ref 150–400)
RBC: 4.25 MIL/uL (ref 4.22–5.81)
RDW: 13.9 % (ref 11.5–15.5)
WBC: 8.9 10*3/uL (ref 4.0–10.5)
nRBC: 0 % (ref 0.0–0.2)

## 2024-01-08 LAB — LIPOPROTEIN A (LPA): Lipoprotein (a): 28.7 nmol/L (ref <75.0)

## 2024-01-08 MED ORDER — TRANEXAMIC ACID 1000 MG/10ML IV SOLN
1.5000 mg/kg/h | INTRAVENOUS | Status: AC
  Administered 2024-01-09: 1.5 mg/kg/h via INTRAVENOUS
  Filled 2024-01-08 (×2): qty 25

## 2024-01-08 MED ORDER — NITROGLYCERIN IN D5W 200-5 MCG/ML-% IV SOLN
2.0000 ug/min | INTRAVENOUS | Status: DC
  Filled 2024-01-08: qty 250

## 2024-01-08 MED ORDER — MILRINONE LACTATE IN DEXTROSE 20-5 MG/100ML-% IV SOLN
0.3000 ug/kg/min | INTRAVENOUS | Status: DC
  Filled 2024-01-08: qty 100

## 2024-01-08 MED ORDER — PHENYLEPHRINE HCL-NACL 20-0.9 MG/250ML-% IV SOLN
30.0000 ug/min | INTRAVENOUS | Status: AC
  Administered 2024-01-09: 25 ug/min via INTRAVENOUS
  Filled 2024-01-08: qty 250

## 2024-01-08 MED ORDER — NOREPINEPHRINE 4 MG/250ML-% IV SOLN
0.0000 ug/min | INTRAVENOUS | Status: DC
  Filled 2024-01-08: qty 250

## 2024-01-08 MED ORDER — MAGNESIUM SULFATE 50 % IJ SOLN
40.0000 meq | INTRAMUSCULAR | Status: DC
  Filled 2024-01-08: qty 9.85

## 2024-01-08 MED ORDER — CEFAZOLIN SODIUM-DEXTROSE 3-4 GM/150ML-% IV SOLN
3.0000 g | INTRAVENOUS | Status: AC
  Administered 2024-01-09 (×2): 3 g via INTRAVENOUS
  Filled 2024-01-08: qty 150

## 2024-01-08 MED ORDER — BISACODYL 5 MG PO TBEC
5.0000 mg | DELAYED_RELEASE_TABLET | Freq: Every day | ORAL | Status: DC | PRN
  Administered 2024-01-08: 5 mg via ORAL
  Filled 2024-01-08: qty 1

## 2024-01-08 MED ORDER — TRANEXAMIC ACID (OHS) PUMP PRIME SOLUTION
2.0000 mg/kg | INTRAVENOUS | Status: DC
  Filled 2024-01-08: qty 2.42

## 2024-01-08 MED ORDER — DEXMEDETOMIDINE HCL IN NACL 400 MCG/100ML IV SOLN
0.1000 ug/kg/h | INTRAVENOUS | Status: AC
  Administered 2024-01-09: .4 ug/kg/h via INTRAVENOUS
  Filled 2024-01-08: qty 100

## 2024-01-08 MED ORDER — EPINEPHRINE HCL 5 MG/250ML IV SOLN IN NS
0.0000 ug/min | INTRAVENOUS | Status: DC
  Filled 2024-01-08: qty 250

## 2024-01-08 MED ORDER — MELATONIN 5 MG PO TABS
5.0000 mg | ORAL_TABLET | Freq: Every evening | ORAL | Status: DC | PRN
  Administered 2024-01-08: 5 mg via ORAL
  Filled 2024-01-08: qty 1

## 2024-01-08 MED ORDER — TRANEXAMIC ACID (OHS) BOLUS VIA INFUSION
15.0000 mg/kg | INTRAVENOUS | Status: AC
  Administered 2024-01-09: 1812 mg via INTRAVENOUS
  Filled 2024-01-08: qty 1812

## 2024-01-08 MED ORDER — PLASMA-LYTE A IV SOLN
INTRAVENOUS | Status: DC
  Filled 2024-01-08: qty 2.5

## 2024-01-08 MED ORDER — VANCOMYCIN HCL 1.5 G IV SOLR
1500.0000 mg | INTRAVENOUS | Status: AC
  Administered 2024-01-09: 1500 mg via INTRAVENOUS
  Filled 2024-01-08: qty 30

## 2024-01-08 MED ORDER — INSULIN REGULAR(HUMAN) IN NACL 100-0.9 UT/100ML-% IV SOLN
INTRAVENOUS | Status: AC
  Administered 2024-01-09: 3.4 [IU]/h via INTRAVENOUS
  Filled 2024-01-08: qty 100

## 2024-01-08 MED ORDER — CEFAZOLIN SODIUM-DEXTROSE 2-4 GM/100ML-% IV SOLN
2.0000 g | INTRAVENOUS | Status: DC
  Filled 2024-01-08: qty 100

## 2024-01-08 MED ORDER — POTASSIUM CHLORIDE 2 MEQ/ML IV SOLN
80.0000 meq | INTRAVENOUS | Status: DC
  Filled 2024-01-08: qty 40

## 2024-01-08 MED ORDER — HEPARIN 30,000 UNITS/1000 ML (OHS) CELLSAVER SOLUTION
Status: DC
  Filled 2024-01-08: qty 1000

## 2024-01-08 NOTE — Progress Notes (Signed)
 PHARMACY - ANTICOAGULATION CONSULT NOTE  Pharmacy Consult for Heparin  Indication: chest pain/ACS  Allergies  Allergen Reactions   Amlodipine  Swelling    Patient had severe swelling   Crestor [Rosuvastatin] Other (See Comments)    myalgias   Silodosin Palpitations   Valsartan  Other (See Comments)    Made patient feel tired and no energy   Warfarin And Related Palpitations    Fast heart beat, went down hill, felt like he was dying    Patient Measurements: Height: 6' 4 (193 cm) Weight: 120.8 kg (266 lb 5.1 oz) IBW/kg (Calculated) : 86.8 HEPARIN  DW (KG): 112.2  Vital Signs: Temp: 97.7 F (36.5 C) (06/12 0751) Temp Source: Oral (06/12 0751) BP: 137/83 (06/12 0751) Pulse Rate: 70 (06/12 0751)  Labs: Recent Labs    01/06/24 0603 01/06/24 5409 01/06/24 0815 01/06/24 2323 01/07/24 0735 01/07/24 1431 01/08/24 0106 01/08/24 0435 01/08/24 0840  HGB  --    < > 12.2*  11.9*  --  12.7*  --   --  12.8*  --   HCT  --    < > 36.0*  35.0*  --  37.3*  --   --  36.8*  --   PLT  --   --   --   --  190  --   --  192  --   APTT  --   --   --   --   --  54*  --   --   --   LABPROT  --   --   --   --   --  13.7  --   --   --   INR  --   --   --   --   --  1.0  --   --   --   HEPARINUNFRC  --   --   --    < > 0.35 0.25* 0.41 0.33 0.39  CREATININE 0.89  --   --   --   --   --   --  0.95  --    < > = values in this interval not displayed.    Estimated Creatinine Clearance: 99.8 mL/min (by C-G formula based on SCr of 0.95 mg/dL).  Assessment: 73 yo male s/p cath with disease in LAD, Diagonal, and proximal RCA. Pharmacy consulted to dose IV heparin  while undergoing TCTS consult. Not on anticoagulants PTA.   Confirmatory heparin  level is therapeutic at 0.39 on UFH IV infusion at 1700 units/hr. CBC stable, no signs of bleeding.  Goal of Therapy:  Heparin  level 0.3-0.7 units/ml Monitor platelets by anticoagulation protocol: Yes   Plan:  Continue heparin  at 1700 units/hr Check  daily heparin  level and CBC Monitor for signs/symptoms of bleeding  Albino Alu, PharmD PGY2 Cardiology Pharmacy Resident 01/08/2024  9:47 AM

## 2024-01-08 NOTE — Plan of Care (Signed)

## 2024-01-08 NOTE — Plan of Care (Signed)
   Problem: Education: Goal: Knowledge of General Education information will improve Description Including pain rating scale, medication(s)/side effects and non-pharmacologic comfort measures Outcome: Progressing

## 2024-01-08 NOTE — Anesthesia Preprocedure Evaluation (Addendum)
 Anesthesia Evaluation  Patient identified by MRN, date of birth, ID band Patient awake    Reviewed: Allergy & Precautions, NPO status , Patient's Chart, lab work & pertinent test results, reviewed documented beta blocker date and time   History of Anesthesia Complications (+) PONV and history of anesthetic complications  Airway Mallampati: III  TM Distance: >3 FB Neck ROM: Full    Dental  (+) Dental Advisory Given   Pulmonary neg pulmonary ROS   Pulmonary exam normal        Cardiovascular hypertension, Pt. on home beta blockers and Pt. on medications + CAD and + Cardiac Stents  Normal cardiovascular exam+ Valvular Problems/Murmurs AS    '25 TTE - EF 60 to 65%. There is mild concentric left ventricular hypertrophy. Trivial MR. Mild AI. Moderate AS. Aortic valve mean gradient measures 21.0 mmHg. Aortic valve Vmax measures 3.26 m/s. Cannot exclude a small PFO.   '25 Cath -   1st Diag lesion is 90% stenosed.   Prox LAD to Mid LAD lesion is 60% stenosed.  Mid LAD lesion is 70% stenosed. Mid LAD to Dist LAD lesion is 90% stenosed.  (Long segment of combined tandem lesions)   LPAV lesion is 80% stenosed.   Prox RCA-1 lesion is 20% stenosed.   Prox RCA-2 lesion is 95% stenosed (discrete eccentric/ulcerated).  Dist RCA lesion is 80% stenosed.   Hemodynamic findings consistent with mild pulmonary hypertension (mostly WHO Class III related to DHF)).    Neuro/Psych  PSYCHIATRIC DISORDERS Anxiety     TIA   GI/Hepatic Neg liver ROS,GERD  Controlled,,  Endo/Other  diabetes, Well Controlled, Type 2   Obesity   Renal/GU negative Renal ROS     Musculoskeletal  (+) Arthritis ,  Fibromyalgia -  Abdominal  (+) + obese  Peds  Hematology  (+) Blood dyscrasia, anemia   Anesthesia Other Findings   Reproductive/Obstetrics                             Anesthesia Physical Anesthesia Plan  ASA:  4  Anesthesia Plan: General   Post-op Pain Management:    Induction: Intravenous  PONV Risk Score and Plan: 3 and Treatment may vary due to age or medical condition and Ondansetron   Airway Management Planned: Oral ETT  Additional Equipment: Arterial line, CVP, PA Cath, TEE and Ultrasound Guidance Line Placement  Intra-op Plan:   Post-operative Plan: Post-operative intubation/ventilation  Informed Consent: I have reviewed the patients History and Physical, chart, labs and discussed the procedure including the risks, benefits and alternatives for the proposed anesthesia with the patient or authorized representative who has indicated his/her understanding and acceptance.     Dental advisory given  Plan Discussed with: CRNA and Anesthesiologist  Anesthesia Plan Comments:        Anesthesia Quick Evaluation

## 2024-01-08 NOTE — TOC Initial Note (Signed)
 Transition of Care [facility]) - Initial/Assessment Note    Patient Details  Name: Samuel Chambers MRN: [id number] Date of Birth: 27-Oct-1950  Transition of Care [facility]) CM/SW Contact:    Cosimo Diones, RN Phone Number: 01/08/2024, 4:01 PM  Clinical Narrative: Patient presented for unstable angina-CAD. Plan for CABG on 01-09-24. PTA patient was independent from home with spouse. Patient has PCP and gets to appointments without any issues. Case Manager will continue to follow for transition of care needs as the patient progresses.                  Expected Discharge Plan: Home w Home Health Services Barriers to Discharge: Continued Medical Work up   Expected Discharge Plan and Services   Discharge Planning Services: CM Consult Post Acute Care Choice: Home Health Living arrangements for the past 2 months: Single Family Home    Prior Living Arrangements/Services Living arrangements for the past 2 months: Single Family Home Lives with:: Spouse Patient language and need for interpreter reviewed:: Yes Do you feel safe going back to the place where you live?: Yes      Need for Family Participation in Patient Care: Yes (Comment) Care giver support system in place?: Yes (comment)   Criminal Activity/Legal Involvement Pertinent to Current Situation/Hospitalization: No - Comment as needed  Activities of Daily Living   ADL Screening (condition at time of admission) Independently performs ADLs?: Yes (appropriate for developmental age) Is the patient deaf or have difficulty hearing?: No Does the patient have difficulty seeing, even when wearing glasses/contacts?: No Does the patient have difficulty concentrating, remembering, or making decisions?: No  Permission Sought/Granted Permission sought to share information with : Family Supports, Case Manager   Emotional Assessment Appearance:: Appears stated age Attitude/Demeanor/Rapport: Engaged Affect (typically observed):  Appropriate Orientation: : Oriented to Self, Oriented to Place, Oriented to  Time, Oriented to Situation Alcohol / Substance Use: Not Applicable Psych Involvement: No (comment)  Admission diagnosis:  Coronary artery disease involving native coronary artery of native heart with unstable angina pectoris (HCC) [I25.110] Patient Active Problem List   Diagnosis Date Noted   Coronary artery disease involving native coronary artery of native heart with unstable angina pectoris (HCC) 01/06/2024   Wheezing 07/08/2023   Persistent cough 07/08/2023   Lower respiratory infection 06/18/2023   History of TIA (transient ischemic attack) 05/26/2023   Statin myopathy 03/11/2023   Prediabetes 08/15/2022   Arthritis of hand 07/27/2022   Acquired trigger finger of left ring finger 07/27/2022   Acute cough 04/18/2022   History of gout 02/07/2021   Primary osteoarthritis of right knee 01/24/2020   OA (osteoarthritis) of hip 03/16/2018   Severe obesity (BMI 35.0-35.9 with comorbidity) (HCC) 08/04/2014   Dyslipidemia 04/29/2013   CAD S/P percutaneous coronary angioplasty    S/P laparoscopic cholecystectomy July 2014 02/18/2013   OA (osteoarthritis) of knee 09/04/2012   Situational anxiety 02/24/2007   Essential hypertension 02/24/2007   GERD 02/24/2007   Osteoarthritis of multiple joints 02/24/2007   PCP:  Elvira Hammersmith, MD Pharmacy:   [id number] - [place], [place] - [address] [place] [address] Phone: [phone] Fax: [phone]  [id number] - [address] ([place]), [place] - [address] [place] [address] Phone: [phone] Fax: [phone]     Social Drivers of Health (SDOH) Social History: SDOH Screenings   Food Insecurity: No Food Insecurity (01/06/2024)  Housing: Low Risk  (  01/06/2024)  Transportation Needs: No Transportation Needs (01/06/2024)  Utilities: Not At  Risk (01/06/2024)  Alcohol Screen: Low Risk  (11/18/2022)  Depression (PHQ2-9): Low Risk  (11/19/2023)  Financial Resource Strain: Low Risk  (11/15/2023)  Physical Activity: Unknown (11/15/2023)  Social Connections: Socially Integrated (01/06/2024)  Stress: No Stress Concern Present (11/15/2023)  Tobacco Use: Low Risk  (01/07/2024)  Health Literacy: Adequate Health Literacy (11/19/2023)   SDOH Interventions:     Readmission Risk Interventions     No data to display

## 2024-01-08 NOTE — Progress Notes (Addendum)
 Rounding Note   Patient Name: Jahvon Gosline Date of Encounter: 01/08/2024  [facility] Cardiologist: Luana Rumple, MD   Subjective Patient is doing well today denies chest pain shortness of breath orthopnea PND lightheadedness dizziness palpitations and peripheral edema.   Scheduled Meds:  aspirin  EC  81 mg Oral Daily   chlorhexidine   15 mL Mouth/Throat Once   Chlorhexidine  Gluconate Cloth  6 each Topical Once   And   Chlorhexidine  Gluconate Cloth  6 each Topical Once   DULoxetine   60 mg Oral Daily   furosemide   40 mg Oral Daily   hydrALAZINE  25 mg Oral Q8H   metoprolol  succinate  50 mg Oral Daily   metoprolol  tartrate  12.5 mg Oral Once   sodium chloride  flush  3 mL Intravenous Q12H   Continuous Infusions:  heparin  1,700 Units/hr (01/08/24 0600)   PRN Meds: acetaminophen , acetaminophen , ondansetron  (ZOFRAN ) IV, sodium chloride  flush   Vital Signs  Vitals:   01/08/24 0451 01/08/24 0605 01/08/24 0750 01/08/24 0751  BP: 132/72 (!) 144/63  137/83  Pulse: 70   70  Resp: 18     Temp: 97.8 F (36.6 C)   97.7 F (36.5 C)  TempSrc: Oral  Oral Oral  SpO2: 95%     Weight:      Height:        Intake/Output Summary (Last 24 hours) at 01/08/2024 0940 Last data filed at 01/08/2024 0600 Gross per 24 hour  Intake 1095.25 ml  Output 400 ml  Net 695.25 ml      01/06/2024    1:02 PM 01/06/2024    5:44 AM 01/01/2024    9:53 AM  Last 3 Weights  Weight (lbs) 266 lb 5.1 oz 259 lb 266 lb 12.8 oz  Weight (kg) 120.8 kg 117.482 kg 121.02 kg      Telemetry NSR with ectopy (PVC) HR 70s - Personally Reviewed  Physical Exam GEN: No acute distress.   Neck: No JVD, no bruits Cardiac: RRR, systolic crescendo murmur (2/6), rubs, or gallops.  Respiratory: Clear to auscultation bilaterally. GI: Soft, nontender, non-distended  MS: No edema; No deformity. Neuro:  Nonfocal  Psych: Normal affect   Labs High Sensitivity Troponin:  No results for input(s):  TROPONINIHS in the last 720 hours.   Chemistry Recent Labs  Lab 01/01/24 1106 01/01/24 1106 01/06/24 0603 01/06/24 0808 01/06/24 0815 01/08/24 0435  NA 139   < > 137 140 140  141 140  K 3.6  --  3.9 4.1 4.0  4.0 3.8  CL 99  --  106  --   --  106  CO2 21  --  23  --   --  23  GLUCOSE 111*  --  105*  --   --  119*  BUN 14  --  12  --   --  12  CREATININE 0.83  --  0.89  --   --  0.95  CALCIUM 10.1  --  8.5*  --   --  8.8*  GFRNONAA  --   --  >60  --   --  >60  ANIONGAP  --   --  8  --   --  11   < > = values in this interval not displayed.    Lipids No results for input(s): CHOL, TRIG, HDL, LABVLDL, LDLCALC, CHOLHDL in the last 168 hours.  Hematology Recent Labs  Lab 01/01/24 1106 01/06/24 0808 01/06/24 0815 01/07/24 0735 01/08/24 0435  WBC  9.6  --   --  8.3 8.9  RBC 4.75  --   --  4.27 4.25  HGB 14.3   < > 12.2*  11.9* 12.7* 12.8*  HCT 41.9   < > 36.0*  35.0* 37.3* 36.8*  MCV 88  --   --  87.4 86.6  MCH 30.1  --   --  29.7 30.1  MCHC 34.1  --   --  34.0 34.8  RDW 13.8  --   --  13.9 13.9  PLT 261  --   --  190 192   < > = values in this interval not displayed.   Thyroid  No results for input(s): TSH, FREET4 in the last 168 hours.  BNPNo results for input(s): BNP, PROBNP in the last 168 hours.  DDimer No results for input(s): DDIMER in the last 168 hours.   Radiology  VAS US  DOPPLER PRE CABG Result Date: 01/07/2024 PREOPERATIVE VASCULAR EVALUATION Patient Name:  ATHANASIOS HELDMAN  Date of Exam:   01/07/2024 Medical Rec #: [id number]           Accession #:    [id number] Date of Birth: 02-25-1951            Patient Gender: M Patient Age:   20 years Exam Location:  [facility] Procedure:      PRE CABG Referring Phys: Landon Pinion HENDRICKSON --------------------------------------------------------------------------------  Indications:      Pre-CABG. Risk Factors:     Hypertension, hyperlipidemia, no history of smoking, coronary                    artery disease. Other Factors:    TIA, History of cardaic stent placement and angioplasty, DM                   (diet controlled). Limitations:      poor ultrasound/tissue interface (carotid) Comparison Study: No previous exams Performing Technologist: Hill, Jody RVT, RDMS  Examination Guidelines: A complete evaluation includes B-mode imaging, spectral Doppler, color Doppler, and power Doppler as needed of all accessible portions of each vessel. Bilateral testing is considered an integral part of a complete examination. Limited examinations for reoccurring indications may be performed as noted.  Right Carotid Findings: +----------+--------+--------+--------+--------+--------+           PSV cm/sEDV cm/sStenosisDescribeComments +----------+--------+--------+--------+--------+--------+ CCA Prox  86      11                               +----------+--------+--------+--------+--------+--------+ CCA Distal103     16                               +----------+--------+--------+--------+--------+--------+ ICA Prox  63      16                               +----------+--------+--------+--------+--------+--------+ ICA Distal56      19                               +----------+--------+--------+--------+--------+--------+ ECA       76      0                                +----------+--------+--------+--------+--------+--------+ +----------+--------+-------+----------------+------------+  PSV cm/sEDV cmsDescribe        Arm Pressure +----------+--------+-------+----------------+------------+ Subclavian103            Multiphasic, [id number]          +----------+--------+-------+----------------+------------+ +---------+--------+--+--------+--+---------+ VertebralPSV cm/s39EDV cm/s12Antegrade +---------+--------+--+--------+--+---------+ Left Carotid Findings: +----------+--------+--------+--------+-----------------------+--------+           PSV cm/sEDV  cm/sStenosisDescribe               Comments +----------+--------+--------+--------+-----------------------+--------+ CCA Prox  98      11                                              +----------+--------+--------+--------+-----------------------+--------+ CCA Distal97      15              heterogenous and smooth         +----------+--------+--------+--------+-----------------------+--------+ ICA Prox  54      16                                              +----------+--------+--------+--------+-----------------------+--------+ ICA Distal58      16                                              +----------+--------+--------+--------+-----------------------+--------+ ECA       86      12                                              +----------+--------+--------+--------+-----------------------+--------+ +----------+--------+--------+----------------+------------+ SubclavianPSV cm/sEDV cm/sDescribe        Arm Pressure +----------+--------+--------+----------------+------------+           130             Multiphasic, [id number]          +----------+--------+--------+----------------+------------+ +---------+--------+--+--------+--+---------+ VertebralPSV cm/s40EDV cm/s11Antegrade +---------+--------+--+--------+--+---------+  ABI Findings: +------------------+-----+---------+ Rt Pressure (mmHg)IndexWaveform  +------------------+-----+---------+ 152                    triphasic +------------------+-----+---------+ 180               1.18 triphasic +------------------+-----+---------+ 168               1.11 biphasic  +------------------+-----+---------+ 116               0.76 Normal    +------------------+-----+---------+ +------------------+-----+---------+ Lt Pressure (mmHg)IndexWaveform  +------------------+-----+---------+ 143                    triphasic +------------------+-----+---------+ 193               1.27 triphasic  +------------------+-----+---------+ 171               1.12 triphasic +------------------+-----+---------+ 97                0.64 Normal    +------------------+-----+---------+  Right Doppler Findings: +--------+--------+---------+ Site    PressureDoppler   +--------+--------+---------+ [id number]     triphasic +--------+--------+---------+ Radial          triphasic +--------+--------+---------+ Ulnar  triphasic +--------+--------+---------+  Left Doppler Findings: +--------+--------+---------+ Site    PressureDoppler   +--------+--------+---------+ [id number]     triphasic +--------+--------+---------+ Radial          triphasic +--------+--------+---------+ Ulnar           triphasic +--------+--------+---------+   Summary: Right Carotid: The extracranial vessels were near-normal with only minimal wall                thickening or plaque. Left Carotid: The extracranial vessels were near-normal with only minimal wall               thickening or plaque. Vertebrals:  Bilateral vertebral arteries demonstrate antegrade flow. Subclavians: Normal flow hemodynamics were seen in bilateral subclavian              arteries. Right ABI: Resting right ankle-brachial index is within normal range. The right toe-brachial index is normal. Left ABI: Resting left ankle-brachial index is within normal range. The left toe-brachial index is mildly abnormal. Bilateral Extremity: Doppler waveforms remain within normal limits with compression bilaterally for the radial arteries. Doppler waveforms remain within normal limits with compression bilaterally for the ulnar arteries.  Electronically signed by Genny Kid MD on 01/07/2024 at 8:59:57 PM.    Final    DG Chest 2 View Result Date: 01/07/2024 CLINICAL DATA:  Preop EXAM: CHEST - 2 VIEW COMPARISON:  X-ray 06/18/2023 and older FINDINGS: Under penetrated and underinflated radiographs. No consolidation, pneumothorax or effusion. No edema.  Tortuous aorta. Normal cardiopericardial silhouette. Motion is seen on lateral view. Scattered degenerative changes. Overlapping cardiac leads. IMPRESSION: Limited x-rays.  Grossly no acute cardiopulmonary disease. Electronically Signed   By: Adrianna Horde M.D.   On: 01/07/2024 18:20   ECHOCARDIOGRAM COMPLETE Result Date: 01/07/2024    ECHOCARDIOGRAM REPORT   Patient Name:   Delmont Prosch Date of Exam: 01/07/2024 Medical Rec #:  [id number]          Height:       76.0 in Accession #:    [id number]         Weight:       266.3 lb Date of Birth:  October 19, 1950           BSA:          2.502 m Patient Age:    72 years           BP:           159/76 mmHg Patient Gender: M                  HR:           79 bpm. Exam Location:  Inpatient Procedure: 2D Echo, Cardiac Doppler and Color Doppler (Both Spectral and Color            Flow Doppler were utilized during procedure). Indications:    I35.0 Nonrheumatic aortic (valve) stenosis  History:        Patient has prior history of Echocardiogram examinations, most                 recent 02/11/2023.  Sonographer:    Andrena Bang Referring Phys: CALLIE E GOODRICH IMPRESSIONS  1. Left ventricular ejection fraction, by estimation, is 60 to 65%. The left ventricle has normal function. The left ventricle has no regional wall motion abnormalities. There is mild concentric left ventricular hypertrophy. Left ventricular diastolic parameters are indeterminate.  2. Right ventricular systolic function is normal. The right ventricular size is normal.  3. The mitral valve is normal in structure. Trivial mitral valve regurgitation. No evidence of mitral stenosis.  4. The aortic valve is calcified. There is moderate calcification of the aortic valve. There is moderate thickening of the aortic valve. Aortic valve regurgitation is mild. Moderate aortic valve stenosis. Aortic valve mean gradient measures 21.0 mmHg. Aortic valve Vmax measures 3.26 m/s.  5. Cannot exclude a small PFO. Comparison(s): No  significant change from prior study. FINDINGS  Left Ventricle: Left ventricular ejection fraction, by estimation, is 60 to 65%. The left ventricle has normal function. The left ventricle has no regional wall motion abnormalities. Definity contrast agent was given IV to delineate the left ventricular  endocardial borders. The left ventricular internal cavity size was normal in size. There is mild concentric left ventricular hypertrophy. Left ventricular diastolic parameters are indeterminate. Right Ventricle: The right ventricular size is normal. No increase in right ventricular wall thickness. Right ventricular systolic function is normal. Left Atrium: Left atrial size was normal in size. Right Atrium: Right atrial size was normal in size. Pericardium: There is no evidence of pericardial effusion. Mitral Valve: The mitral valve is normal in structure. Trivial mitral valve regurgitation. No evidence of mitral valve stenosis. Tricuspid Valve: The tricuspid valve is grossly normal. Tricuspid valve regurgitation is trivial. No evidence of tricuspid stenosis. Aortic Valve: The aortic valve is calcified. There is moderate calcification of the aortic valve. There is moderate thickening of the aortic valve. Aortic valve regurgitation is mild. Moderate aortic stenosis is present. Aortic valve mean gradient measures 21.0 mmHg. Aortic valve peak gradient measures 42.5 mmHg. Aortic valve area, by VTI measures 1.64 cm. Pulmonic Valve: The pulmonic valve was not well visualized. Pulmonic valve regurgitation is mild. No evidence of pulmonic stenosis. Aorta: The aortic root and ascending aorta are structurally normal, with no evidence of dilitation. Ascending aorta measurements are within normal limits for age when indexed to body surface area. Venous: The inferior vena cava was not well visualized. IAS/Shunts: Cannot exclude a small PFO.  LEFT VENTRICLE PLAX 2D LVIDd:         4.60 cm      Diastology LVIDs:         2.90 cm      LV  e' medial:    5.77 cm/s LV PW:         1.50 cm      LV E/e' medial:  16.2 LV IVS:        1.20 cm      LV e' lateral:   9.68 cm/s LVOT diam:     2.20 cm      LV E/e' lateral: 9.6 LV SV:         116 LV SV Index:   46 LVOT Area:     3.80 cm  LV Volumes (MOD) LV vol d, MOD A2C: 103.0 ml LV vol d, MOD A4C: 127.0 ml LV vol s, MOD A2C: 28.0 ml LV vol s, MOD A4C: 33.7 ml LV SV MOD A2C:     75.0 ml LV SV MOD A4C:     127.0 ml LV SV MOD BP:      85.9 ml RIGHT VENTRICLE RV S prime:     18.00 cm/s TAPSE (M-mode): 2.2 cm LEFT ATRIUM             Index LA diam:        4.80 cm 1.92 cm/m LA Vol (A2C):   52.7 ml 21.06 ml/m LA Vol (A4C):  41.1 ml 16.43 ml/m LA Biplane Vol: 48.3 ml 19.31 ml/m  AORTIC VALVE AV Area (Vmax):    1.53 cm AV Area (Vmean):   1.42 cm AV Area (VTI):     1.64 cm AV Vmax:           326.00 cm/s AV Vmean:          213.000 cm/s AV VTI:            0.708 m AV Peak Grad:      42.5 mmHg AV Mean Grad:      21.0 mmHg LVOT Vmax:         131.00 cm/s LVOT Vmean:        79.800 cm/s LVOT VTI:          0.305 m LVOT/AV VTI ratio: 0.43  AORTA Ao Asc diam: 3.80 cm MITRAL VALVE MV Area (PHT): 3.13 cm    SHUNTS MV Decel Time: 242 msec    Systemic VTI:  0.30 m MV E velocity: 93.30 cm/s  Systemic Diam: 2.20 cm MV A velocity: 82.30 cm/s MV E/A ratio:  1.13 Sheryle Donning MD Electronically signed by Sheryle Donning MD Signature Date/Time: 01/07/2024/1:55:49 PM    Final     Patient Profile   73 y.o. male with history of CAD, T2DM, HTN, HLD, fibromyalgia, and GERD who was seen as an outpatient on 01/06/24 by Dr. Abel Hoe and was sent for urgent cath for unstable angina   Assessment & Plan  CAD s/p DES to LCA in 2008 Unstable angina Presented to outpatient follow up with Dr. Abel Hoe on 01/06/24 reporting worsening dyspnea on exertion and angina. Evidence of ischemia seen on PET CT stress test (12/29/33) with possible inferior wall ischemia. He was sent to [facility] for urgent cardiac catheterization.  RHC/LHC on  01/06/24 showed severe multivessel CAD (proximal RCA 95% ulcerated stenosis with distal RCA 80% stenosis, long segment in LAD after D1 with 60%, 70%, and 90% stenosis, and proximal D1 with 90% stenosis and previously placed AV groove circumflex stent with 80% ISR.    CT surgery was consulted for coronary bypass graft given disease extent and patient's symptom burden. CABG is scheduled for tomorrow (01/09/24). See AS below. Appreciate CT surgery recommendations.  Denies chest pain or anginal equivalent.   -continue heparin  drip per CT surgery -continue ASA 81mg  -hold off starting P2Y12i at this time due to CABG  -continue Toprol  XL 50mg  -see hyperlipidemia below in regards to lipid management.    Hypertension Last blood pressure (137/83) -continue hydralazine 25 mg TID -continue Toprol  XL 50 mg -continue lasix  40 mg daily -hold losartan  100 mg for CABG -hold PTA hydrochlorothiazide    Chronic diastolic heart failure-new diagnosis Evidence of diastolic heart failure seen during RHC/LHC (elevated LVEDP and PCWP (23-24 mmhg)). Patient does endorse shortness of breath with exertion which could be multifactorial (AS and unstable angina). He reports that is it minimal, he is able to perform house chores without difficulty.  Echo on 01/07/24 is similar in appearance to previous echo: EF 60-65% with no regional wall motion abnormalities with mild LVH.  Denies lightheadedness, dizziness, palpitations, SOB at rest, orthopnea, PND, and peripheral edema  On exam looks euvolemic, will diuresis as missed dose yesterday and would like to optimize for surgery.  -continue lasix  40 mg -hold losartan  100 mg for upcoming CABG -continue Toprol  XL 50 mg -patient to have CABG, will consider SGLT2i after intervention.    Aortic Stenosis Echo 01/07/24 showed stable moderate AS with calcification  Per CT surgery planning to not replace valve during CABG given poor dentition and severity of AS, however may replace if  intra op issues occur. If not replaced during CABG, recommend TAVR when medically indicated. Appreciate recommendations.    Hyperlipidemia Statin intolerant. He has been seen in the lipid clinic, he declined PCSK9i.       For questions or updates, please contact [facility] Please consult [email or web address] for contact info under     Signed, Mabel Savage, PA-C  01/08/2024, 9:40 AM     ATTENDING ATTESTATION:  After conducting a review of all available clinical information with the care team, interviewing the patient, and performing a physical exam, I agree with the findings and plan described in this note.   GEN: No acute distress.   HEENT:  MMM, no JVD, no scleral icterus Cardiac: RRR, 2/6 SEM Respiratory: Clear to auscultation bilaterally. GI: Soft, nontender, non-distended  MS: No edema; No deformity. Neuro:  Nonfocal  Vasc:  +2 radial pulses  Patient remains stable without chest pain or dyspnea.  Agreeable to lasix  today.  OR tomorrow for CABG +/- AVR.     Alyssa Backbone, MD Pager [phone]

## 2024-01-08 NOTE — Progress Notes (Signed)
 PHARMACY - ANTICOAGULATION  Pharmacy Consult for Heparin  Indication: chest pain/ACS Brief A/P: Heparin  level within goal range Continue Heparin  at current rate   Allergies  Allergen Reactions   Amlodipine  Swelling    Patient had severe swelling   Crestor [Rosuvastatin] Other (See Comments)    myalgias   Silodosin Palpitations   Valsartan  Other (See Comments)    Made patient feel tired and no energy   Warfarin And Related Palpitations    Fast heart beat, went down hill, felt like he was dying    Patient Measurements: Height: 6' 4 (193 cm) Weight: 120.8 kg (266 lb 5.1 oz) IBW/kg (Calculated) : 86.8 HEPARIN  DW (KG): 112.2  Vital Signs: Temp: 98.8 F (37.1 C) (06/11 2325) Temp Source: Oral (06/11 2325) BP: 135/68 (06/11 2325) Pulse Rate: 70 (06/11 2325)  Labs: Recent Labs    01/06/24 0603 01/06/24 1610 01/06/24 9604 01/06/24 0815 01/06/24 2323 01/07/24 0735 01/07/24 1431 01/08/24 0106  HGB  --  11.9*   < > 12.2*  11.9*  --  12.7*  --   --   HCT  --  35.0*  --  36.0*  35.0*  --  37.3*  --   --   PLT  --   --   --   --   --  190  --   --   APTT  --   --   --   --   --   --  54*  --   LABPROT  --   --   --   --   --   --  13.7  --   INR  --   --   --   --   --   --  1.0  --   HEPARINUNFRC  --   --   --   --    < > 0.35 0.25* 0.41  CREATININE 0.89  --   --   --   --   --   --   --    < > = values in this interval not displayed.    Estimated Creatinine Clearance: 106.5 mL/min (by C-G formula based on SCr of 0.89 mg/dL).  Assessment: 73 y.o. male with CAD awaiting CABG for heparin   Goal of Therapy:  Heparin  level 0.3-0.7 units/ml Monitor platelets by anticoagulation protocol: Yes   Plan:  No change to heparin    Claudine Cullens, PharmD, BCPS  01/08/2024  2:16 AM

## 2024-01-08 NOTE — Progress Notes (Signed)
 2 Days Post-Op Procedure(s) (LRB): RIGHT/LEFT HEART CATH AND CORONARY ANGIOGRAPHY (N/A) Subjective: No complaints this Am Nervous about surgery  Objective: Vital signs in last 24 hours: Temp:  [97.7 F (36.5 C)-99.2 F (37.3 C)] 97.7 F (36.5 C) (06/12 0751) Pulse Rate:  [70-95] 95 (06/12 1056) Cardiac Rhythm: Normal sinus rhythm (06/12 0800) Resp:  [18-20] 18 (06/12 0451) BP: (132-157)/(63-83) 157/79 (06/12 1056) SpO2:  [95 %] 95 % (06/12 0451)  Hemodynamic parameters for last 24 hours:    Intake/Output from previous day: 06/11 0701 - 06/12 0700 In: 1095.3 [P.O.:477; I.V.:618.3] Out: 400 [Urine:400] Intake/Output this shift: No intake/output data recorded.  General appearance: alert, cooperative, and no distress Neurologic: intact Heart: regular rate and rhythm + murmur  Lab Results: Recent Labs    01/07/24 0735 01/08/24 0435  WBC 8.3 8.9  HGB 12.7* 12.8*  HCT 37.3* 36.8*  PLT 190 192   BMET:  Recent Labs    01/06/24 0603 01/06/24 0808 01/06/24 0815 01/08/24 0435  NA 137   < > 140  141 140  K 3.9   < > 4.0  4.0 3.8  CL 106  --   --  106  CO2 23  --   --  23  GLUCOSE 105*  --   --  119*  BUN 12  --   --  12  CREATININE 0.89  --   --  0.95  CALCIUM 8.5*  --   --  8.8*   < > = values in this interval not displayed.    PT/INR:  Recent Labs    01/07/24 1431  LABPROT 13.7  INR 1.0   ABG    Component Value Date/Time   PHART 7.358 01/06/2024 0808   HCO3 23.6 01/06/2024 0815   HCO3 23.6 01/06/2024 0815   TCO2 25 01/06/2024 0815   TCO2 25 01/06/2024 0815   ACIDBASEDEF 3.0 (H) 01/06/2024 0815   ACIDBASEDEF 3.0 (H) 01/06/2024 0815   O2SAT 67 01/06/2024 0815   O2SAT 69 01/06/2024 0815   CBG (last 3)  Recent Labs    01/06/24 0547  GLUCAP 101*    Assessment/Plan: S/P Procedure(s) (LRB): RIGHT/LEFT HEART CATH AND CORONARY ANGIOGRAPHY (N/A) 3 vessel CAD, moderate AS  For CABG, possible AVR tomorrow Plan CABG only unless AVR is  necessary All questions answered   LOS: 2 days    Samuel Chambers 01/08/2024

## 2024-01-09 ENCOUNTER — Inpatient Hospital Stay (HOSPITAL_COMMUNITY): Payer: Self-pay | Admitting: Anesthesiology

## 2024-01-09 ENCOUNTER — Other Ambulatory Visit: Payer: Self-pay

## 2024-01-09 ENCOUNTER — Encounter (HOSPITAL_COMMUNITY): Payer: Self-pay | Admitting: Cardiology

## 2024-01-09 ENCOUNTER — Inpatient Hospital Stay (HOSPITAL_COMMUNITY)

## 2024-01-09 ENCOUNTER — Inpatient Hospital Stay (HOSPITAL_COMMUNITY)
Admission: RE | Disposition: A | Discharge status: Home / Self Care | Payer: Self-pay | Source: Home / Self Care | Attending: Thoracic Surgery (Cardiothoracic Vascular Surgery)

## 2024-01-09 DIAGNOSIS — I350 Nonrheumatic aortic (valve) stenosis: Secondary | ICD-10-CM | POA: Diagnosis not present

## 2024-01-09 DIAGNOSIS — E119 Type 2 diabetes mellitus without complications: Secondary | ICD-10-CM

## 2024-01-09 DIAGNOSIS — I2510 Atherosclerotic heart disease of native coronary artery without angina pectoris: Secondary | ICD-10-CM | POA: Diagnosis not present

## 2024-01-09 DIAGNOSIS — I25119 Atherosclerotic heart disease of native coronary artery with unspecified angina pectoris: Secondary | ICD-10-CM | POA: Diagnosis not present

## 2024-01-09 DIAGNOSIS — Z951 Presence of aortocoronary bypass graft: Secondary | ICD-10-CM

## 2024-01-09 DIAGNOSIS — I10 Essential (primary) hypertension: Secondary | ICD-10-CM | POA: Diagnosis not present

## 2024-01-09 LAB — CBC
HCT: 37.3 % — ABNORMAL LOW (ref 39.0–52.0)
HCT: 29.5 % — ABNORMAL LOW (ref 39.0–52.0)
HCT: 31.6 % — ABNORMAL LOW (ref 39.0–52.0)
Hemoglobin: 13.1 g/dL (ref 13.0–17.0)
Hemoglobin: 10.1 g/dL — ABNORMAL LOW (ref 13.0–17.0)
Hemoglobin: 10.7 g/dL — ABNORMAL LOW (ref 13.0–17.0)
MCH: 30.6 pg (ref 26.0–34.0)
MCH: 30.1 pg (ref 26.0–34.0)
MCH: 29.8 pg (ref 26.0–34.0)
MCHC: 35.1 g/dL (ref 30.0–36.0)
MCHC: 34.2 g/dL (ref 30.0–36.0)
MCHC: 33.9 g/dL (ref 30.0–36.0)
MCV: 87.1 fL (ref 80.0–100.0)
MCV: 88.1 fL (ref 80.0–100.0)
MCV: 88 fL (ref 80.0–100.0)
Platelets: 199 10*3/uL (ref 150–400)
Platelets: 142 10*3/uL — ABNORMAL LOW (ref 150–400)
Platelets: 155 10*3/uL (ref 150–400)
RBC: 4.28 MIL/uL (ref 4.22–5.81)
RBC: 3.35 MIL/uL — ABNORMAL LOW (ref 4.22–5.81)
RBC: 3.59 MIL/uL — ABNORMAL LOW (ref 4.22–5.81)
RDW: 13.9 % (ref 11.5–15.5)
RDW: 13.8 % (ref 11.5–15.5)
RDW: 13.9 % (ref 11.5–15.5)
WBC: 9.7 10*3/uL (ref 4.0–10.5)
WBC: 13.9 10*3/uL — ABNORMAL HIGH (ref 4.0–10.5)
WBC: 11.8 10*3/uL — ABNORMAL HIGH (ref 4.0–10.5)
nRBC: 0 % (ref 0.0–0.2)
nRBC: 0 % (ref 0.0–0.2)
nRBC: 0 % (ref 0.0–0.2)

## 2024-01-09 LAB — POCT I-STAT 7, (LYTES, BLD GAS, ICA,H+H)
Acid-base deficit: 4 mmol/L — ABNORMAL HIGH (ref 0.0–2.0)
Acid-base deficit: 1 mmol/L (ref 0.0–2.0)
Acid-base deficit: 3 mmol/L — ABNORMAL HIGH (ref 0.0–2.0)
Acid-base deficit: 3 mmol/L — ABNORMAL HIGH (ref 0.0–2.0)
Acid-base deficit: 5 mmol/L — ABNORMAL HIGH (ref 0.0–2.0)
Bicarbonate: 21.7 mmol/L (ref 20.0–28.0)
Bicarbonate: 24.6 mmol/L (ref 20.0–28.0)
Bicarbonate: 21.8 mmol/L (ref 20.0–28.0)
Bicarbonate: 22.5 mmol/L (ref 20.0–28.0)
Bicarbonate: 21.5 mmol/L (ref 20.0–28.0)
Calcium, Ion: 1.02 mmol/L — ABNORMAL LOW (ref 1.15–1.40)
Calcium, Ion: 1.07 mmol/L — ABNORMAL LOW (ref 1.15–1.40)
Calcium, Ion: 1.07 mmol/L — ABNORMAL LOW (ref 1.15–1.40)
Calcium, Ion: 1.12 mmol/L — ABNORMAL LOW (ref 1.15–1.40)
Calcium, Ion: 1.15 mmol/L (ref 1.15–1.40)
HCT: 26 % — ABNORMAL LOW (ref 39.0–52.0)
HCT: 27 % — ABNORMAL LOW (ref 39.0–52.0)
HCT: 29 % — ABNORMAL LOW (ref 39.0–52.0)
HCT: 28 % — ABNORMAL LOW (ref 39.0–52.0)
HCT: 30 % — ABNORMAL LOW (ref 39.0–52.0)
Hemoglobin: 8.8 g/dL — ABNORMAL LOW (ref 13.0–17.0)
Hemoglobin: 9.2 g/dL — ABNORMAL LOW (ref 13.0–17.0)
Hemoglobin: 9.9 g/dL — ABNORMAL LOW (ref 13.0–17.0)
Hemoglobin: 9.5 g/dL — ABNORMAL LOW (ref 13.0–17.0)
Hemoglobin: 10.2 g/dL — ABNORMAL LOW (ref 13.0–17.0)
O2 Saturation: 100 %
O2 Saturation: 100 %
O2 Saturation: 95 %
O2 Saturation: 96 %
O2 Saturation: 94 %
Patient temperature: 36
Patient temperature: 36.4
Patient temperature: 36.6
Potassium: 4.5 mmol/L (ref 3.5–5.1)
Potassium: 4.2 mmol/L (ref 3.5–5.1)
Potassium: 3.8 mmol/L (ref 3.5–5.1)
Potassium: 4.2 mmol/L (ref 3.5–5.1)
Potassium: 4.3 mmol/L (ref 3.5–5.1)
Sodium: 138 mmol/L (ref 135–145)
Sodium: 138 mmol/L (ref 135–145)
Sodium: 140 mmol/L (ref 135–145)
Sodium: 139 mmol/L (ref 135–145)
Sodium: 139 mmol/L (ref 135–145)
TCO2: 23 mmol/L (ref 22–32)
TCO2: 26 mmol/L (ref 22–32)
TCO2: 23 mmol/L (ref 22–32)
TCO2: 24 mmol/L (ref 22–32)
TCO2: 23 mmol/L (ref 22–32)
pCO2 arterial: 41.1 mmHg (ref 32–48)
pCO2 arterial: 44.6 mmHg (ref 32–48)
pCO2 arterial: 36.6 mmHg (ref 32–48)
pCO2 arterial: 39.7 mmHg (ref 32–48)
pCO2 arterial: 43 mmHg (ref 32–48)
pH, Arterial: 7.331 — ABNORMAL LOW (ref 7.35–7.45)
pH, Arterial: 7.351 (ref 7.35–7.45)
pH, Arterial: 7.379 (ref 7.35–7.45)
pH, Arterial: 7.358 (ref 7.35–7.45)
pH, Arterial: 7.305 — ABNORMAL LOW (ref 7.35–7.45)
pO2, Arterial: 348 mmHg — ABNORMAL HIGH (ref 83–108)
pO2, Arterial: 262 mmHg — ABNORMAL HIGH (ref 83–108)
pO2, Arterial: 71 mmHg — ABNORMAL LOW (ref 83–108)
pO2, Arterial: 80 mmHg — ABNORMAL LOW (ref 83–108)
pO2, Arterial: 79 mmHg — ABNORMAL LOW (ref 83–108)

## 2024-01-09 LAB — BASIC METABOLIC PANEL WITH GFR
Anion gap: 9 (ref 5–15)
BUN: 10 mg/dL (ref 8–23)
CO2: 22 mmol/L (ref 22–32)
Calcium: 7.9 mg/dL — ABNORMAL LOW (ref 8.9–10.3)
Chloride: 107 mmol/L (ref 98–111)
Creatinine, Ser: 0.92 mg/dL (ref 0.61–1.24)
GFR, Estimated: >60 mL/min (ref >60)
Glucose, Bld: 123 mg/dL — ABNORMAL HIGH (ref 70–99)
Potassium: 4.2 mmol/L (ref 3.5–5.1)
Sodium: 138 mmol/L (ref 135–145)

## 2024-01-09 LAB — POCT I-STAT, CHEM 8
BUN: 13 mg/dL (ref 8–23)
BUN: 14 mg/dL (ref 8–23)
BUN: 12 mg/dL (ref 8–23)
BUN: 11 mg/dL (ref 8–23)
Calcium, Ion: 1.21 mmol/L (ref 1.15–1.40)
Calcium, Ion: 1.22 mmol/L (ref 1.15–1.40)
Calcium, Ion: 1.03 mmol/L — ABNORMAL LOW (ref 1.15–1.40)
Calcium, Ion: 1.07 mmol/L — ABNORMAL LOW (ref 1.15–1.40)
Chloride: 103 mmol/L (ref 98–111)
Chloride: 103 mmol/L (ref 98–111)
Chloride: 102 mmol/L (ref 98–111)
Chloride: 103 mmol/L (ref 98–111)
Creatinine, Ser: 0.9 mg/dL (ref 0.61–1.24)
Creatinine, Ser: 1 mg/dL (ref 0.61–1.24)
Creatinine, Ser: 0.9 mg/dL (ref 0.61–1.24)
Creatinine, Ser: 0.9 mg/dL (ref 0.61–1.24)
Glucose, Bld: 108 mg/dL — ABNORMAL HIGH (ref 70–99)
Glucose, Bld: 136 mg/dL — ABNORMAL HIGH (ref 70–99)
Glucose, Bld: 137 mg/dL — ABNORMAL HIGH (ref 70–99)
Glucose, Bld: 141 mg/dL — ABNORMAL HIGH (ref 70–99)
HCT: 35 % — ABNORMAL LOW (ref 39.0–52.0)
HCT: 32 % — ABNORMAL LOW (ref 39.0–52.0)
HCT: 24 % — ABNORMAL LOW (ref 39.0–52.0)
HCT: 29 % — ABNORMAL LOW (ref 39.0–52.0)
Hemoglobin: 11.9 g/dL — ABNORMAL LOW (ref 13.0–17.0)
Hemoglobin: 10.9 g/dL — ABNORMAL LOW (ref 13.0–17.0)
Hemoglobin: 8.2 g/dL — ABNORMAL LOW (ref 13.0–17.0)
Hemoglobin: 9.9 g/dL — ABNORMAL LOW (ref 13.0–17.0)
Potassium: 3.6 mmol/L (ref 3.5–5.1)
Potassium: 4.4 mmol/L (ref 3.5–5.1)
Potassium: 4.8 mmol/L (ref 3.5–5.1)
Potassium: 4.3 mmol/L (ref 3.5–5.1)
Sodium: 141 mmol/L (ref 135–145)
Sodium: 137 mmol/L (ref 135–145)
Sodium: 137 mmol/L (ref 135–145)
Sodium: 138 mmol/L (ref 135–145)
TCO2: 24 mmol/L (ref 22–32)
TCO2: 24 mmol/L (ref 22–32)
TCO2: 28 mmol/L (ref 22–32)
TCO2: 25 mmol/L (ref 22–32)

## 2024-01-09 LAB — PROTIME-INR
INR: 1.3 — ABNORMAL HIGH (ref 0.8–1.2)
Prothrombin Time: 16.5 s — ABNORMAL HIGH (ref 11.4–15.2)

## 2024-01-09 LAB — MAGNESIUM: Magnesium: 2.9 mg/dL — ABNORMAL HIGH (ref 1.7–2.4)

## 2024-01-09 LAB — HEMOGLOBIN AND HEMATOCRIT, BLOOD
HCT: 24.5 % — ABNORMAL LOW (ref 39.0–52.0)
Hemoglobin: 8.4 g/dL — ABNORMAL LOW (ref 13.0–17.0)

## 2024-01-09 LAB — POCT I-STAT EG7
Acid-base deficit: 2 mmol/L (ref 0.0–2.0)
Bicarbonate: 24.5 mmol/L (ref 20.0–28.0)
Calcium, Ion: 1.08 mmol/L — ABNORMAL LOW (ref 1.15–1.40)
HCT: 26 % — ABNORMAL LOW (ref 39.0–52.0)
Hemoglobin: 8.8 g/dL — ABNORMAL LOW (ref 13.0–17.0)
O2 Saturation: 77 %
Potassium: 4.3 mmol/L (ref 3.5–5.1)
Sodium: 138 mmol/L (ref 135–145)
TCO2: 26 mmol/L (ref 22–32)
pCO2, Ven: 49.7 mmHg (ref 44–60)
pH, Ven: 7.301 (ref 7.25–7.43)
pO2, Ven: 46 mmHg — ABNORMAL HIGH (ref 32–45)

## 2024-01-09 LAB — GLUCOSE, CAPILLARY
Glucose-Capillary: 134 mg/dL — ABNORMAL HIGH (ref 70–99)
Glucose-Capillary: 132 mg/dL — ABNORMAL HIGH (ref 70–99)
Glucose-Capillary: 137 mg/dL — ABNORMAL HIGH (ref 70–99)
Glucose-Capillary: 150 mg/dL — ABNORMAL HIGH (ref 70–99)
Glucose-Capillary: 159 mg/dL — ABNORMAL HIGH (ref 70–99)

## 2024-01-09 LAB — APTT: aPTT: 35 s (ref 24–36)

## 2024-01-09 LAB — PLATELET COUNT: Platelets: 153 10*3/uL (ref 150–400)

## 2024-01-09 LAB — HEPARIN LEVEL (UNFRACTIONATED): Heparin Unfractionated: 0.22 [IU]/mL — ABNORMAL LOW (ref 0.30–0.70)

## 2024-01-09 SURGERY — CORONARY ARTERY BYPASS GRAFTING (CABG)
Anesthesia: General | Site: Chest

## 2024-01-09 MED ORDER — FENTANYL CITRATE (PF) 250 MCG/5ML IJ SOLN
INTRAMUSCULAR | Status: AC
  Filled 2024-01-09: qty 5

## 2024-01-09 MED ORDER — LACTATED RINGERS IV SOLN: INTRAVENOUS | Status: DC

## 2024-01-09 MED ORDER — PROPOFOL 10 MG/ML IV BOLUS
INTRAVENOUS | Status: DC | PRN
  Administered 2024-01-09 (×5): 50 mg via INTRAVENOUS

## 2024-01-09 MED ORDER — CHLORHEXIDINE GLUCONATE 0.12 % MT SOLN
15.0000 mL | OROMUCOSAL | Status: AC
  Administered 2024-01-09: 15 mL via OROMUCOSAL
  Filled 2024-01-09: qty 15

## 2024-01-09 MED ORDER — SODIUM CHLORIDE 0.9% FLUSH
3.0000 mL | Freq: Two times a day (BID) | INTRAVENOUS | Status: DC
  Administered 2024-01-10: 10 mL via INTRAVENOUS
  Administered 2024-01-10 – 2024-01-11 (×3): 3 mL via INTRAVENOUS

## 2024-01-09 MED ORDER — MAGNESIUM SULFATE 4 GM/100ML IV SOLN
4.0000 g | Freq: Once | INTRAVENOUS | Status: AC
  Administered 2024-01-09: 4 g via INTRAVENOUS
  Filled 2024-01-09: qty 100

## 2024-01-09 MED ORDER — ORAL CARE MOUTH RINSE: 15.0000 mL | OROMUCOSAL | Status: DC | PRN

## 2024-01-09 MED ORDER — DEXMEDETOMIDINE HCL IN NACL 400 MCG/100ML IV SOLN
INTRAVENOUS | Status: AC
Start: 2024-01-09 — End: 2024-01-09
  Filled 2024-01-09: qty 100

## 2024-01-09 MED ORDER — DOCUSATE SODIUM 100 MG PO CAPS
200.0000 mg | ORAL_CAPSULE | Freq: Every day | ORAL | Status: DC
  Administered 2024-01-10 – 2024-01-14 (×4): 200 mg via ORAL
  Filled 2024-01-09 (×5): qty 2

## 2024-01-09 MED ORDER — LACTATED RINGERS IV SOLN: INTRAVENOUS | Status: DC | PRN

## 2024-01-09 MED ORDER — ALBUMIN HUMAN 5 % IV SOLN
250.0000 mL | INTRAVENOUS | Status: DC | PRN
  Administered 2024-01-09: 12.5 g via INTRAVENOUS

## 2024-01-09 MED ORDER — METOPROLOL TARTRATE 5 MG/5ML IV SOLN
2.5000 mg | INTRAVENOUS | Status: DC | PRN
  Administered 2024-01-10: 5 mg via INTRAVENOUS
  Filled 2024-01-09: qty 5

## 2024-01-09 MED ORDER — POTASSIUM CHLORIDE 10 MEQ/50ML IV SOLN
10.0000 meq | INTRAVENOUS | Status: AC
  Administered 2024-01-09 (×3): 10 meq via INTRAVENOUS

## 2024-01-09 MED ORDER — PANTOPRAZOLE SODIUM 40 MG PO TBEC
40.0000 mg | DELAYED_RELEASE_TABLET | Freq: Every day | ORAL | Status: DC
  Administered 2024-01-11 – 2024-01-14 (×4): 40 mg via ORAL
  Filled 2024-01-09 (×4): qty 1

## 2024-01-09 MED ORDER — PROTAMINE SULFATE 10 MG/ML IV SOLN
INTRAVENOUS | Status: DC | PRN
  Administered 2024-01-09: 340 mg via INTRAVENOUS
  Administered 2024-01-09: 10 mg via INTRAVENOUS

## 2024-01-09 MED ORDER — PROPOFOL 10 MG/ML IV BOLUS
INTRAVENOUS | Status: AC
  Filled 2024-01-09: qty 20

## 2024-01-09 MED ORDER — SODIUM CHLORIDE 0.9% FLUSH: 3.0000 mL | INTRAVENOUS | Status: DC | PRN

## 2024-01-09 MED ORDER — ROCURONIUM BROMIDE 10 MG/ML (PF) SYRINGE
PREFILLED_SYRINGE | INTRAVENOUS | Status: AC
  Filled 2024-01-09: qty 10

## 2024-01-09 MED ORDER — SODIUM CHLORIDE 0.9 % IV SOLN: INTRAVENOUS | Status: DC

## 2024-01-09 MED ORDER — INSULIN REGULAR(HUMAN) IN NACL 100-0.9 UT/100ML-% IV SOLN: INTRAVENOUS | Status: DC

## 2024-01-09 MED ORDER — ACETAMINOPHEN 160 MG/5ML PO SOLN
650.0000 mg | Freq: Once | ORAL | Status: AC
  Administered 2024-01-09: 650 mg
  Filled 2024-01-09: qty 20.3

## 2024-01-09 MED ORDER — ACETAMINOPHEN 500 MG PO TABS
1000.0000 mg | ORAL_TABLET | Freq: Four times a day (QID) | ORAL | Status: DC
  Administered 2024-01-10 – 2024-01-14 (×16): 1000 mg via ORAL
  Filled 2024-01-09 (×17): qty 2

## 2024-01-09 MED ORDER — HEPARIN SODIUM (PORCINE) 1000 UNIT/ML IJ SOLN
INTRAMUSCULAR | Status: DC | PRN
  Administered 2024-01-09: 38000 [IU] via INTRAVENOUS
  Administered 2024-01-09: 2000 [IU] via INTRAVENOUS

## 2024-01-09 MED ORDER — DEXMEDETOMIDINE HCL IN NACL 400 MCG/100ML IV SOLN
0.0000 ug/kg/h | INTRAVENOUS | Status: DC
  Administered 2024-01-09: 0.4 ug/kg/h via INTRAVENOUS
  Filled 2024-01-09: qty 100

## 2024-01-09 MED ORDER — 0.9 % SODIUM CHLORIDE (POUR BTL) OPTIME
TOPICAL | Status: DC | PRN
  Administered 2024-01-09: 5000 mL

## 2024-01-09 MED ORDER — ORAL CARE MOUTH RINSE
15.0000 mL | OROMUCOSAL | Status: DC
  Administered 2024-01-09: 15 mL via OROMUCOSAL

## 2024-01-09 MED ORDER — FENTANYL CITRATE (PF) 250 MCG/5ML IJ SOLN
INTRAMUSCULAR | Status: AC
Start: 2024-01-09 — End: 2024-01-09
  Filled 2024-01-09: qty 5

## 2024-01-09 MED ORDER — SODIUM CHLORIDE 0.9% FLUSH: 10.0000 mL | INTRAVENOUS | Status: DC | PRN

## 2024-01-09 MED ORDER — HEMOSTATIC AGENTS (NO CHARGE) OPTIME
TOPICAL | Status: DC | PRN
  Administered 2024-01-09: 1 via TOPICAL

## 2024-01-09 MED ORDER — ALBUMIN HUMAN 5 % IV SOLN: INTRAVENOUS | Status: DC | PRN

## 2024-01-09 MED ORDER — SODIUM CHLORIDE (PF) 0.9 % IJ SOLN: OROMUCOSAL | Status: DC | PRN

## 2024-01-09 MED ORDER — SODIUM CHLORIDE 0.9 % IV SOLN: INTRAVENOUS | Status: DC | PRN

## 2024-01-09 MED ORDER — SODIUM CHLORIDE 0.45 % IV SOLN: INTRAVENOUS | Status: AC | PRN

## 2024-01-09 MED ORDER — OXYCODONE HCL 5 MG PO TABS
5.0000 mg | ORAL_TABLET | ORAL | Status: DC | PRN
  Administered 2024-01-09 – 2024-01-10 (×5): 10 mg via ORAL
  Filled 2024-01-09 (×5): qty 2

## 2024-01-09 MED ORDER — MIDAZOLAM HCL 2 MG/2ML IJ SOLN: 2.0000 mg | INTRAMUSCULAR | Status: DC | PRN

## 2024-01-09 MED ORDER — MORPHINE SULFATE (PF) 2 MG/ML IV SOLN
1.0000 mg | INTRAVENOUS | Status: DC | PRN
  Administered 2024-01-09 (×2): 2 mg via INTRAVENOUS
  Administered 2024-01-09: 4 mg via INTRAVENOUS
  Administered 2024-01-09: 2 mg via INTRAVENOUS
  Administered 2024-01-10 (×2): 4 mg via INTRAVENOUS
  Filled 2024-01-09: qty 2
  Filled 2024-01-09 (×2): qty 1
  Filled 2024-01-09: qty 2
  Filled 2024-01-09: qty 1
  Filled 2024-01-09: qty 2

## 2024-01-09 MED ORDER — METOPROLOL TARTRATE 12.5 MG HALF TABLET
12.5000 mg | ORAL_TABLET | Freq: Two times a day (BID) | ORAL | Status: DC
  Administered 2024-01-09 – 2024-01-10 (×3): 12.5 mg via ORAL
  Filled 2024-01-09 (×2): qty 1

## 2024-01-09 MED ORDER — NITROGLYCERIN IN D5W 200-5 MCG/ML-% IV SOLN
0.0000 ug/min | INTRAVENOUS | Status: DC
  Administered 2024-01-09: 5 ug/min via INTRAVENOUS

## 2024-01-09 MED ORDER — SODIUM CHLORIDE 0.9 % IV SOLN: 250.0000 mL | INTRAVENOUS | Status: DC

## 2024-01-09 MED ORDER — BISACODYL 10 MG RE SUPP: 10.0000 mg | Freq: Every day | RECTAL | Status: DC

## 2024-01-09 MED ORDER — ROCURONIUM BROMIDE 10 MG/ML (PF) SYRINGE
PREFILLED_SYRINGE | INTRAVENOUS | Status: DC | PRN
  Administered 2024-01-09: 20 mg via INTRAVENOUS
  Administered 2024-01-09: 50 mg via INTRAVENOUS
  Administered 2024-01-09: 30 mg via INTRAVENOUS
  Administered 2024-01-09: 100 mg via INTRAVENOUS
  Administered 2024-01-09: 20 mg via INTRAVENOUS

## 2024-01-09 MED ORDER — SODIUM CHLORIDE 0.9% FLUSH
10.0000 mL | Freq: Two times a day (BID) | INTRAVENOUS | Status: DC
  Administered 2024-01-09: 20 mL
  Administered 2024-01-09: 10 mL
  Administered 2024-01-10: 40 mL
  Administered 2024-01-10 – 2024-01-11 (×3): 10 mL

## 2024-01-09 MED ORDER — PROPOFOL 10 MG/ML IV BOLUS
INTRAVENOUS | Status: AC
Start: 2024-01-09 — End: 2024-01-09
  Filled 2024-01-09: qty 20

## 2024-01-09 MED ORDER — MIDAZOLAM HCL (PF) 10 MG/2ML IJ SOLN
INTRAMUSCULAR | Status: AC
  Filled 2024-01-09: qty 2

## 2024-01-09 MED ORDER — METOPROLOL TARTRATE 25 MG/10 ML ORAL SUSPENSION: 12.5000 mg | Freq: Two times a day (BID) | ORAL | Status: DC

## 2024-01-09 MED ORDER — ASPIRIN 81 MG PO CHEW
324.0000 mg | CHEWABLE_TABLET | Freq: Once | ORAL | Status: AC
  Administered 2024-01-09: 324 mg via ORAL
  Filled 2024-01-09: qty 4

## 2024-01-09 MED ORDER — NITROGLYCERIN 0.2 MG/ML ON CALL CATH LAB
INTRAVENOUS | Status: DC | PRN
  Administered 2024-01-09: 10 ug via INTRAVENOUS
  Administered 2024-01-09: 20 ug via INTRAVENOUS

## 2024-01-09 MED ORDER — CEFAZOLIN SODIUM-DEXTROSE 2-4 GM/100ML-% IV SOLN
2.0000 g | Freq: Three times a day (TID) | INTRAVENOUS | Status: AC
  Administered 2024-01-09 – 2024-01-11 (×6): 2 g via INTRAVENOUS
  Filled 2024-01-09 (×6): qty 100

## 2024-01-09 MED ORDER — ASPIRIN 81 MG PO CHEW
324.0000 mg | CHEWABLE_TABLET | Freq: Every day | ORAL | Status: DC
  Filled 2024-01-09 (×2): qty 4

## 2024-01-09 MED ORDER — TRAMADOL HCL 50 MG PO TABS
50.0000 mg | ORAL_TABLET | ORAL | Status: DC | PRN
  Administered 2024-01-10: 50 mg via ORAL
  Administered 2024-01-10: 100 mg via ORAL
  Administered 2024-01-11: 50 mg via ORAL
  Filled 2024-01-09: qty 1
  Filled 2024-01-09: qty 2
  Filled 2024-01-09: qty 1

## 2024-01-09 MED ORDER — PHENYLEPHRINE 80 MCG/ML (10ML) SYRINGE FOR IV PUSH (FOR BLOOD PRESSURE SUPPORT)
PREFILLED_SYRINGE | INTRAVENOUS | Status: DC | PRN
  Administered 2024-01-09 (×3): 80 ug via INTRAVENOUS

## 2024-01-09 MED ORDER — DEXTROSE 50 % IV SOLN: 0.0000 mL | INTRAVENOUS | Status: DC | PRN

## 2024-01-09 MED ORDER — ACETAMINOPHEN 160 MG/5ML PO SOLN: 1000.0000 mg | Freq: Four times a day (QID) | ORAL | Status: DC

## 2024-01-09 MED ORDER — VANCOMYCIN HCL IN DEXTROSE 1-5 GM/200ML-% IV SOLN
1000.0000 mg | Freq: Once | INTRAVENOUS | Status: AC
  Administered 2024-01-09: 1000 mg via INTRAVENOUS
  Filled 2024-01-09: qty 200

## 2024-01-09 MED ORDER — METOCLOPRAMIDE HCL 5 MG/ML IJ SOLN
10.0000 mg | Freq: Four times a day (QID) | INTRAMUSCULAR | Status: AC
  Administered 2024-01-09 – 2024-01-10 (×6): 10 mg via INTRAVENOUS
  Filled 2024-01-09 (×6): qty 2

## 2024-01-09 MED ORDER — PHENYLEPHRINE HCL-NACL 20-0.9 MG/250ML-% IV SOLN: 0.0000 ug/min | INTRAVENOUS | Status: DC

## 2024-01-09 MED ORDER — BISACODYL 5 MG PO TBEC
10.0000 mg | DELAYED_RELEASE_TABLET | Freq: Every day | ORAL | Status: DC
  Administered 2024-01-10 – 2024-01-14 (×3): 10 mg via ORAL
  Filled 2024-01-09 (×5): qty 2

## 2024-01-09 MED ORDER — MIDAZOLAM HCL (PF) 5 MG/ML IJ SOLN
INTRAMUSCULAR | Status: DC | PRN
  Administered 2024-01-09: 2 mg via INTRAVENOUS
  Administered 2024-01-09: 1 mg via INTRAVENOUS

## 2024-01-09 MED ORDER — PANTOPRAZOLE SODIUM 40 MG IV SOLR
40.0000 mg | Freq: Every day | INTRAVENOUS | Status: AC
Start: 2024-01-09 — End: 2024-01-11
  Administered 2024-01-09 – 2024-01-10 (×2): 40 mg via INTRAVENOUS
  Filled 2024-01-09 (×2): qty 10

## 2024-01-09 MED ORDER — ONDANSETRON HCL 4 MG/2ML IJ SOLN
4.0000 mg | Freq: Four times a day (QID) | INTRAMUSCULAR | Status: DC | PRN
  Administered 2024-01-10 – 2024-01-11 (×4): 4 mg via INTRAVENOUS
  Filled 2024-01-09 (×4): qty 2

## 2024-01-09 MED ORDER — PLASMA-LYTE A IV SOLN: INTRAVENOUS | Status: DC | PRN

## 2024-01-09 MED ORDER — ASPIRIN 325 MG PO TBEC
325.0000 mg | DELAYED_RELEASE_TABLET | Freq: Every day | ORAL | Status: DC
  Administered 2024-01-10 – 2024-01-14 (×5): 325 mg via ORAL
  Filled 2024-01-09 (×5): qty 1

## 2024-01-09 MED ORDER — FENTANYL CITRATE (PF) 250 MCG/5ML IJ SOLN
INTRAMUSCULAR | Status: DC | PRN
  Administered 2024-01-09: 50 ug via INTRAVENOUS
  Administered 2024-01-09: 250 ug via INTRAVENOUS
  Administered 2024-01-09: 50 ug via INTRAVENOUS
  Administered 2024-01-09 (×2): 100 ug via INTRAVENOUS
  Administered 2024-01-09: 200 ug via INTRAVENOUS
  Administered 2024-01-09: 150 ug via INTRAVENOUS
  Administered 2024-01-09: 50 ug via INTRAVENOUS
  Administered 2024-01-09: 200 ug via INTRAVENOUS
  Administered 2024-01-09 (×2): 50 ug via INTRAVENOUS

## 2024-01-09 MED ORDER — LACTATED RINGERS IV SOLN: INTRAVENOUS | Status: AC

## 2024-01-09 MED ORDER — CHLORHEXIDINE GLUCONATE CLOTH 2 % EX PADS
6.0000 | MEDICATED_PAD | Freq: Every day | CUTANEOUS | Status: DC
  Administered 2024-01-09 – 2024-01-11 (×3): 6 via TOPICAL

## 2024-01-09 SURGICAL SUPPLY — 73 items
ADAPTER CARDIO PERF ANTE/RETRO (ADAPTER) ×2 IMPLANT
ADAPTER MULTI PERFUSION 15 (ADAPTER) ×2 IMPLANT
BAG DECANTER FOR FLEXI CONT (MISCELLANEOUS) ×2 IMPLANT
BLADE CLIPPER SURG (BLADE) ×2 IMPLANT
BLADE STERNUM SYSTEM 6 (BLADE) ×2 IMPLANT
BLADE SURG 11 STRL SS (BLADE) IMPLANT
BLADE SURG 15 STRL LF DISP TIS (BLADE) ×2 IMPLANT
BNDG ELASTIC 4INX 5YD STR LF (GAUZE/BANDAGES/DRESSINGS) IMPLANT
BNDG ELASTIC 6INX 5YD STR LF (GAUZE/BANDAGES/DRESSINGS) ×2 IMPLANT
BNDG GAUZE DERMACEA FLUFF 4 (GAUZE/BANDAGES/DRESSINGS) ×2 IMPLANT
CANISTER SUCTION 3000ML PPV (SUCTIONS) ×2 IMPLANT
CANNULA AORTIC ROOT 9FR (CANNULA) ×2 IMPLANT
CANNULA EZ GLIDE 8.0 24FR (CANNULA) IMPLANT
CANNULA MC2 2 STG 36/46 NON-V (CANNULA) IMPLANT
CANNULA VESSEL 3MM BLUNT TIP (CANNULA) ×6 IMPLANT
CATH ROBINSON RED A/P 18FR (CATHETERS) ×4 IMPLANT
CATH THORACIC 36FR (CATHETERS) ×2 IMPLANT
CATH THORACIC 36FR RT ANG (CATHETERS) ×4 IMPLANT
CLIP TI WIDE RED SMALL 24 (CLIP) IMPLANT
CONTAINER PROTECT SURGISLUSH (MISCELLANEOUS) ×4 IMPLANT
DERMABOND ADVANCED .7 DNX12 (GAUZE/BANDAGES/DRESSINGS) IMPLANT
DRAPE SRG 135X102X78XABS (DRAPES) ×2 IMPLANT
DRAPE WARM FLUID 44X44 (DRAPES) ×2 IMPLANT
DRSG COVADERM 4X14 (GAUZE/BANDAGES/DRESSINGS) ×2 IMPLANT
ELECTRODE REM PT RTRN 9FT ADLT (ELECTROSURGICAL) ×4 IMPLANT
FELT TEFLON 1X6 (MISCELLANEOUS) ×4 IMPLANT
GAUZE SPONGE 4X4 12PLY STRL (GAUZE/BANDAGES/DRESSINGS) ×4 IMPLANT
GLOVE SS BIOGEL STRL SZ 7.5 (GLOVE) ×2 IMPLANT
GOWN STRL REUS W/ TWL LRG LVL3 (GOWN DISPOSABLE) ×8 IMPLANT
GOWN STRL REUS W/ TWL XL LVL3 (GOWN DISPOSABLE) ×4 IMPLANT
HEMOSTAT POWDER SURGIFOAM 1G (HEMOSTASIS) ×6 IMPLANT
HEMOSTAT SURGICEL 2X14 (HEMOSTASIS) ×2 IMPLANT
KIT BASIN OR (CUSTOM PROCEDURE TRAY) ×2 IMPLANT
KIT SUCTION CATH 14FR (SUCTIONS) ×4 IMPLANT
KIT TURNOVER KIT B (KITS) ×2 IMPLANT
KIT VASOVIEW HEMOPRO 2 VH 4000 (KITS) ×2 IMPLANT
LINE VENT (MISCELLANEOUS) IMPLANT
MARKER GRAFT CORONARY BYPASS (MISCELLANEOUS) ×6 IMPLANT
NS IRRIG 1000ML POUR BTL (IV SOLUTION) ×10 IMPLANT
PACK E OPEN HEART (SUTURE) ×2 IMPLANT
PACK OPEN HEART (CUSTOM PROCEDURE TRAY) ×2 IMPLANT
PAD ARMBOARD POSITIONER FOAM (MISCELLANEOUS) ×4 IMPLANT
PAD ELECT DEFIB RADIOL ZOLL (MISCELLANEOUS) ×2 IMPLANT
PENCIL BUTTON HOLSTER BLD 10FT (ELECTRODE) ×2 IMPLANT
POSITIONER HEAD DONUT 9IN (MISCELLANEOUS) ×2 IMPLANT
PUNCH AORTIC ROTATE 4.5MM 8IN (MISCELLANEOUS) IMPLANT
PUNCH AORTIC ROTATE 5MM 8IN (MISCELLANEOUS) IMPLANT
SET MPS 3-ND DEL (MISCELLANEOUS) IMPLANT
SOLUTION ANTFG W/FOAM PAD STRL (MISCELLANEOUS) IMPLANT
SPONGE T-LAP 18X18 ~~LOC~~+RFID (SPONGE) IMPLANT
STOPCOCK 4 WAY LG BORE MALE ST (IV SETS) IMPLANT
SUPPORT HEART JANKE-BARRON (MISCELLANEOUS) ×2 IMPLANT
SUT BONE WAX W31G (SUTURE) ×2 IMPLANT
SUT MNCRL AB 4-0 PS2 18 (SUTURE) IMPLANT
SUT PROLENE 3 0 SH DA (SUTURE) ×2 IMPLANT
SUT PROLENE 4 0 SH DA (SUTURE) IMPLANT
SUT PROLENE 4-0 RB1 .5 CRCL 36 (SUTURE) ×4 IMPLANT
SUT PROLENE 6 0 C 1 30 (SUTURE) ×4 IMPLANT
SUT PROLENE 7 0 BV1 MDA (SUTURE) ×2 IMPLANT
SUT STEEL 6MS V (SUTURE) ×2 IMPLANT
SUT STEEL SZ 6 DBL 3X14 BALL (SUTURE) ×2 IMPLANT
SUT VIC AB 1 CTX36XBRD ANBCTR (SUTURE) ×4 IMPLANT
SUT VIC AB 2-0 CT1 TAPERPNT 27 (SUTURE) IMPLANT
SYSTEM SAHARA CHEST DRAIN ATS (WOUND CARE) ×2 IMPLANT
TAPE CLOTH SURG 4X10 WHT LF (GAUZE/BANDAGES/DRESSINGS) IMPLANT
TAPE PAPER 2X10 WHT MICROPORE (GAUZE/BANDAGES/DRESSINGS) IMPLANT
TOWEL GREEN STERILE (TOWEL DISPOSABLE) ×2 IMPLANT
TOWEL GREEN STERILE FF (TOWEL DISPOSABLE) ×2 IMPLANT
TRAY FOLEY SLVR 16FR TEMP STAT (SET/KITS/TRAYS/PACK) ×2 IMPLANT
TUBE SUCT INTRACARD DLP 20F (MISCELLANEOUS) ×2 IMPLANT
TUBING LAP HI FLOW INSUFFLATIO (TUBING) ×2 IMPLANT
UNDERPAD 30X36 HEAVY ABSORB (UNDERPADS AND DIAPERS) ×2 IMPLANT
WATER STERILE IRR 1000ML POUR (IV SOLUTION) ×4 IMPLANT

## 2024-01-09 NOTE — Anesthesia Postprocedure Evaluation (Signed)
 Anesthesia Post Note  Patient: Christoffer Currier  Procedure(s) Performed: CORONARY ARTERY BYPASS GRAFTING (CABG) TIMES THREE UTILIZING LEFT INTERNAL MAMMARY ARTERY, ENDOSCOPIC VEIN HARVEST RIGHT GREATER SAPHENOUS VEIN (Chest) ECHOCARDIOGRAM, TRANSESOPHAGEAL     Patient location during evaluation: ICU Anesthesia Type: General Level of consciousness: sedated and patient remains intubated per anesthesia plan Pain management: pain level controlled Vital Signs Assessment: post-procedure vital signs reviewed and stable Respiratory status: patient remains intubated per anesthesia plan Cardiovascular status: stable Postop Assessment: no apparent nausea or vomiting Anesthetic complications: no  No notable events documented.  Last Vitals:  Vitals:   01/09/24 0730 01/09/24 1312  BP:  125/63  Pulse: 66 80  Resp:  16  Temp:    SpO2: 95% 94%    Last Pain:  Vitals:   01/09/24 0654  TempSrc:   PainSc: 0-No pain                 Juventino Oppenheim

## 2024-01-09 NOTE — Procedures (Signed)
 Extubation Procedure Note  Patient Details:   Name: Samuel Chambers DOB: 1950/12/09 MRN: [id number]   Airway Documentation:    Vent end date: 01/09/24 Vent end time: 1725   Evaluation  O2 sats: stable throughout Complications: No apparent complications Patient did tolerate procedure well. Bilateral Breath Sounds: Clear   Yes  Patient was extubated to a 4L  without any complications, dyspnea or stridor noted. NIF: -20, VC: 900, positive cuff leak prior to extubation. Patient was instructed on IS by RN, highest goal reached was .  [name], [name] 01/09/2024, 5:25 PM

## 2024-01-09 NOTE — Hospital Course (Addendum)
 History of Present Illness:     This is a 73 year old male with a past medical history of diet controlled diabetes mellitus, CAD (DES stent to Circumflex 2008), hypertension, hyperlipidemia (statin intolerant), obesity, GERD, possible TIA (last year, per wife), and fibromyalgia who Saw Dr. Abel Hoe on 12/18/2023 because of complaints of dyspnea with exertion. He had a drug eluting stent placed in his Circumflex in 2008. Echo July 2024 with LVEF=60-65%, mild aortic stenosis, aortic valve peak gradient  measures 41.7 mmHg, and aortic valve area, by VTI measures 1.15 cm. He had a CT cardiac perfusion scan done 12/30/2023 which showed inferior wall perfusion defect c/w ischemia. He presented today for a cardiac catheterization. Results showed mid LAD with a 70% stenosis, mid LAD to distal LAD with a 90% stenosis, first Diagonal with a 90% stenosis, proximal RCA 2 with a 95% stenosis (discrete eccentric/ulcerated), distal RCA lesion is 80% stenosed, and mild pulmonary hypertension. Echocardiogram has been ordered but not taken yet. Cardiothoracic consultation was requested. At the time of my exam, patient denies shortness of breath or chest pain. He does complaint of body hurting all over, but has had for awhile and has a history of fibromyalgia.   Patient states he is active. He mows lawn, uses weed eater, goes fishing,etc. He is married. His wife and daughter were at the bedside during the consultation and questions were answered accordingly.  Dr. Luna Salinas reviewed the patient's diagnostic studies and determined he would benefit from surgical intervention. He reviewed the patient's treatment options as well as the risks and benefits of surgery with the patient. Samuel Chambers was agreeable to proceed with surgery.  Hospital Course: Samuel Chambers was admitted to [facility] and remained in stable condition. He was brought to the operating room on 01/09/24 and underwent CABG x 3 utilizing LIMA to LAD, SVG to  PDA, and SVG to Diagonal as well as endoscopic harvest of the right greater saphenous vein. After intraoperative TEE it was not felt the aortic valve required replacement. He tolerated the procedure well and was transferred to the SICU in stable condition.

## 2024-01-09 NOTE — Anesthesia Procedure Notes (Signed)
 Central Venous Catheter Insertion Performed by: Juventino Oppenheim, MD, anesthesiologist Start/End6/13/2025 8:15 AM, 01/09/2024 8:17 AM Patient location: Pre-op. Preanesthetic checklist: patient identified, IV checked, risks and benefits discussed, surgical consent, monitors and equipment checked, pre-op evaluation, timeout performed and anesthesia consent Position: Trendelenburg Hand hygiene performed  and maximum sterile barriers used  Total catheter length 10. PA cath was placed.Swan type:thermodilution PA Cath depth:50 Procedure performed without using ultrasound guided technique. Attempts: 1 Patient tolerated the procedure well with no immediate complications.

## 2024-01-09 NOTE — Anesthesia Procedure Notes (Signed)
 Arterial Line Insertion Start/End6/13/2025 7:00 AM, 01/09/2024 7:05 AM Performed by: Juventino Oppenheim, MD, Alisia Apple, CRNA, CRNA  Preanesthetic checklist: patient identified, IV checked, site marked, risks and benefits discussed, surgical consent, monitors and equipment checked, pre-op evaluation, timeout performed and anesthesia consent Lidocaine  1% used for infiltration Left, radial was placed Catheter size: 20 G Hand hygiene performed , maximum sterile barriers used  and Seldinger technique used Allen's test indicative of satisfactory collateral circulation Attempts: 1 Procedure performed using ultrasound guided technique. Ultrasound Notes:anatomy identified, needle tip was noted to be adjacent to the nerve/plexus identified and no ultrasound evidence of intravascular and/or intraneural injection Following insertion, Biopatch. Post procedure assessment: normal  Patient tolerated the procedure well with no immediate complications.

## 2024-01-09 NOTE — Anesthesia Procedure Notes (Signed)
 Central Venous Catheter Insertion Performed by: Juventino Oppenheim, MD, anesthesiologist Start/End6/13/2025 8:09 AM, 01/09/2024 8:17 AM Patient location: OR. Preanesthetic checklist: patient identified, IV checked, risks and benefits discussed, surgical consent, monitors and equipment checked, pre-op evaluation, timeout performed and anesthesia consent Position: Trendelenburg Patient sedated Hand hygiene performed , maximum sterile barriers used  and Seldinger technique used Catheter size: 8.5 Fr Central line was placed.Sheath introducer Procedure performed using ultrasound guided technique. Ultrasound Notes:anatomy identified, needle tip was noted to be adjacent to the nerve/plexus identified, no ultrasound evidence of intravascular and/or intraneural injection and image(s) printed for medical record Attempts: 1 Following insertion, line sutured, dressing applied and Biopatch. Post procedure assessment: blood return through all ports, free fluid flow and no air  Patient tolerated the procedure well with no immediate complications.

## 2024-01-09 NOTE — Brief Op Note (Signed)
 01/06/2024 - 01/09/2024  12:53 PM  PATIENT:  Samuel Chambers  73 y.o. male  PRE-OPERATIVE DIAGNOSIS:  CORONARY ARTERY DISEASE  POST-OPERATIVE DIAGNOSIS:  CORONARY ARTERY DISEASE  PROCEDURE:   CORONARY ARTERY BYPASS GRAFTING (CABG) TIMES THREE UTILIZING LEFT INTERNAL MAMMARY ARTERY, ENDOSCOPIC VEIN HARVEST RIGHT GREATER SAPHENOUS VEIN  ECHOCARDIOGRAM, TRANSESOPHAGEAL  Vein harvest time: Vein prep time: -LIMA to LAD -SVG to PDA -SVG to Diagonal  SURGEON:  Surgeons and Role:    Zelphia Higashi, MD - Primary  PHYSICIAN ASSISTANT: Levora Reas PA-C  ASSISTANTS: Levorn Reason RNFA   ANESTHESIA:   general  EBL:  800 mL   BLOOD ADMINISTERED:none  DRAINS: Mediastinal drains   LOCAL MEDICATIONS USED:  NONE  SPECIMEN:  No Specimen  DISPOSITION OF SPECIMEN:  N/A  COUNTS:  YES  DICTATION: .Dragon Dictation  PLAN OF CARE: Admit to inpatient   PATIENT DISPOSITION:  ICU - intubated and hemodynamically stable.   Delay start of Pharmacological VTE agent (>24hrs) due to surgical blood loss or risk of bleeding: yes

## 2024-01-09 NOTE — Anesthesia Procedure Notes (Signed)
 Procedure Name: Intubation Date/Time: 01/09/2024 7:56 AM  Performed by: Andee Bamberger, CRNAPre-anesthesia Checklist: Patient identified, Emergency Drugs available, Suction available and Patient being monitored Patient Re-evaluated:Patient Re-evaluated prior to induction Oxygen Delivery Method: Circle system utilized Preoxygenation: Pre-oxygenation with 100% oxygen Induction Type: IV induction Ventilation: Mask ventilation without difficulty Laryngoscope Size: Mac and 4 Grade View: Grade I Tube type: Oral Tube size: 8.0 mm Number of attempts: 1 Airway Equipment and Method: Stylet and Oral airway Placement Confirmation: ETT inserted through vocal cords under direct vision, positive ETCO2 and breath sounds checked- equal and bilateral Secured at: 23 cm Tube secured with: Tape Dental Injury: Teeth and Oropharynx as per pre-operative assessment

## 2024-01-09 NOTE — Transfer of Care (Signed)
 Immediate Anesthesia Transfer of Care Note  Patient: Samuel Chambers  Procedure(s) Performed: CORONARY ARTERY BYPASS GRAFTING (CABG) TIMES THREE UTILIZING LEFT INTERNAL MAMMARY ARTERY, ENDOSCOPIC VEIN HARVEST RIGHT GREATER SAPHENOUS VEIN (Chest) ECHOCARDIOGRAM, TRANSESOPHAGEAL  Patient Location: SICU  Anesthesia Type:General  Level of Consciousness: sedated and Patient remains intubated per anesthesia plan  Airway & Oxygen Therapy: Patient remains intubated per anesthesia plan and Patient placed on Ventilator (see vital sign flow sheet for setting)  Post-op Assessment: Report given to RN and Post -op Vital signs reviewed and stable  Post vital signs: Reviewed and stable  Last Vitals:  Vitals Value Taken Time  BP    Temp    Pulse 80 01/09/24 13:14  Resp 16 01/09/24 13:14  SpO2 94 % 01/09/24 13:14  Vitals shown include unfiled device data.  Last Pain:  Vitals:   01/09/24 0654  TempSrc:   PainSc: 0-No pain      Patients Stated Pain Goal: 0 (01/07/24 2100)  Complications: No notable events documented.

## 2024-01-09 NOTE — Progress Notes (Signed)
  Echocardiogram Echocardiogram Transesophageal has been performed.  Dione Franks 01/09/2024, 9:49 AM

## 2024-01-09 NOTE — Progress Notes (Signed)
 Patient ID: Samuel Chambers, male   DOB: 1950/10/01, 73 y.o.   MRN: [id number]   TCTS Evening Rounds:   Hemodynamically stable  CI = 2.16  Has started to wake up on vent. Weaning   Urine output good  CT output low  CBC    Component Value Date/Time   WBC 13.9 (H) 01/09/2024 1322   RBC 3.35 (L) 01/09/2024 1322   HGB 10.1 (L) 01/09/2024 1322   HGB 14.3 01/01/2024 1106   HCT 29.5 (L) 01/09/2024 1322   HCT 41.9 01/01/2024 1106   PLT 142 (L) 01/09/2024 1322   PLT 261 01/01/2024 1106   MCV 88.1 01/09/2024 1322   MCV 88 01/01/2024 1106   MCH 30.1 01/09/2024 1322   MCHC 34.2 01/09/2024 1322   RDW 13.8 01/09/2024 1322   RDW 13.8 01/01/2024 1106   LYMPHSABS 2.4 07/08/2023 1420   LYMPHSABS 2.5 12/23/2019 0952   MONOABS 0.7 07/08/2023 1420   EOSABS 0.3 07/08/2023 1420   EOSABS 0.1 12/23/2019 0952   BASOSABS 0.1 07/08/2023 1420   BASOSABS 0.1 12/23/2019 0952     BMET    Component Value Date/Time   NA 138 01/09/2024 1204   NA 139 01/01/2024 1106   K 4.3 01/09/2024 1204   CL 103 01/09/2024 1204   CO2 23 01/08/2024 0435   GLUCOSE 141 (H) 01/09/2024 1204   BUN 11 01/09/2024 1204   BUN 14 01/01/2024 1106   CREATININE 0.90 01/09/2024 1204   CREATININE 0.89 09/21/2015 1410   CALCIUM 8.8 (L) 01/08/2024 0435   EGFR 93 01/01/2024 1106   GFRNONAA >60 01/08/2024 0435     A/P:  Stable postop course. Continue current plans

## 2024-01-09 NOTE — Discharge Summary (Addendum)
 [address]       [place] [address]             [phone]    Physician Discharge Summary  Patient ID: Samuel Chambers MRN: [id number] DOB/AGE: 12/23/1950 73 y.o.  Admit date: 01/06/2024 Discharge date: 01/14/2024  Admission Diagnoses:  Patient Active Problem List   Diagnosis Date Noted   Coronary artery disease involving native coronary artery of native heart with unstable angina pectoris (HCC) 01/06/2024   Wheezing 07/08/2023   Persistent cough 07/08/2023   Lower respiratory infection 06/18/2023   History of TIA (transient ischemic attack) 05/26/2023   Statin myopathy 03/11/2023   Prediabetes 08/15/2022   Arthritis of hand 07/27/2022   Acquired trigger finger of left ring finger 07/27/2022   Acute cough 04/18/2022   History of gout 02/07/2021   Primary osteoarthritis of right knee 01/24/2020   OA (osteoarthritis) of hip 03/16/2018   Severe obesity (BMI 35.0-35.9 with comorbidity) (HCC) 08/04/2014   Dyslipidemia 04/29/2013   CAD S/P percutaneous coronary angioplasty    S/P laparoscopic cholecystectomy July 2014 02/18/2013   OA (osteoarthritis) of knee 09/04/2012   Situational anxiety 02/24/2007   Essential hypertension 02/24/2007   GERD 02/24/2007   Osteoarthritis of multiple joints 02/24/2007     Discharge Diagnoses:  Patient Active Problem List   Diagnosis Date Noted   S/P CABG x 3 01/09/2024   Coronary artery disease involving native coronary artery of native heart with unstable angina pectoris (HCC) 01/06/2024   Wheezing 07/08/2023   Persistent cough 07/08/2023   Lower respiratory infection 06/18/2023   History of TIA (transient ischemic attack) 05/26/2023   Statin myopathy 03/11/2023   Prediabetes 08/15/2022   Arthritis of hand 07/27/2022   Acquired trigger finger of left ring finger 07/27/2022   Acute cough 04/18/2022   History of gout 02/07/2021   Primary osteoarthritis of right knee 01/24/2020   OA (osteoarthritis) of hip  03/16/2018   Severe obesity (BMI 35.0-35.9 with comorbidity) (HCC) 08/04/2014   Dyslipidemia 04/29/2013   CAD S/P percutaneous coronary angioplasty    S/P laparoscopic cholecystectomy July 2014 02/18/2013   OA (osteoarthritis) of knee 09/04/2012   Situational anxiety 02/24/2007   Essential hypertension 02/24/2007   GERD 02/24/2007   Osteoarthritis of multiple joints 02/24/2007  Post-operative atrial fibrillation Expected acute blood loss anemia   Discharged Condition: stable  History of Present Illness:     This is a 73 year old male with a past medical history of diet controlled diabetes mellitus, CAD (DES stent to Circumflex 2008), hypertension, hyperlipidemia (statin intolerant), obesity, GERD, possible TIA (last year, per wife), and fibromyalgia who Saw Dr. Abel Hoe on 12/18/2023 because of complaints of dyspnea with exertion. He had a drug eluting stent placed in his Circumflex in 2008. Echo July 2024 with LVEF=60-65%, mild aortic stenosis, aortic valve peak gradient  measures 41.7 mmHg, and aortic valve area, by VTI measures 1.15 cm. He had a CT cardiac perfusion scan done 12/30/2023 which showed inferior wall perfusion defect c/w ischemia. He presented today for a cardiac catheterization. Results showed mid LAD with a 70% stenosis, mid LAD to distal LAD with a 90% stenosis, first Diagonal with a 90% stenosis, proximal RCA 2 with a 95% stenosis (discrete eccentric/ulcerated), distal RCA lesion is 80% stenosed, and mild pulmonary hypertension. Echocardiogram has been ordered but not taken yet. Cardiothoracic consultation was requested. At the time of my exam, patient denies shortness of breath or chest pain. He does complaint of  body hurting all over, but has had for awhile and has a history of fibromyalgia.   Patient states he is active. He mows lawn, uses weed eater, goes fishing,etc. He is married. His wife and daughter were at the bedside during the consultation and questions were  answered accordingly.  Dr. Luna Salinas reviewed the patient's diagnostic studies and determined he would benefit from surgical intervention. He reviewed the patient's treatment options as well as the risks and benefits of surgery with the patient. Samuel Chambers was agreeable to proceed with surgery.  Hospital Course: Samuel Chambers was admitted to [facility] and remained in stable condition. He was brought to the operating room on 01/09/24 and underwent CABG x 3 utilizing LIMA to LAD, SVG to PDA, and SVG to Diagonal as well as endoscopic harvest of the right greater saphenous vein. After intraoperative TEE it was not felt the aortic valve required replacement. He tolerated the procedure well and was transferred to the SICU in stable condition.  He did not require pressor support.  He was maintaining NSR and started on low dose Lopressor .  His chest tubes, arterial lines and swan ganz catheter were removed without difficulty.  He was started on lasix  to help facilitate diuresis.  He had increased pain and Toradol was added with relief of symptoms.  He developed Atrial Fibrillation with RVR.  He was treated with IV Amiodarone drip protocol.  He did not convert but his rate improved, he then developed intermittent atrial fibrillation. IV Amiodarone was transitioned to PO Amiodarone. He was hypertensive, Metoprolol  was titrated and he was restarted on low dose Cozaar . He developed nausea that improved with Reglan , Phenergan  PRN, and Zofran  PRN. His bowels were moving appropriately. He was felt stable for transfer to the progressive unit on 06/16. He was coughing up green sputum but he was afebrile and there were no signs of pneumonia on CXR, mucinex  was started. He was ambulating well on room air and incisions were healing well without sign of infection. He has remained in stable SR for the past 24 hours.   Consults: None  Significant Diagnostic Studies:  RIGHT/LEFT HEART CATH AND CORONARY ANGIOGRAPHY      1st Diag lesion is 90% stenosed.   Prox LAD to Mid LAD lesion is 60% stenosed.  Mid LAD lesion is 70% stenosed. Mid LAD to Dist LAD lesion is 90% stenosed.  (Long segment of combined tandem lesions)   LPAV lesion is 80% stenosed.   Prox RCA-1 lesion is 20% stenosed.   Prox RCA-2 lesion is 95% stenosed (discrete eccentric/ulcerated).  Dist RCA lesion is 80% stenosed.   Hemodynamic findings consistent with mild pulmonary hypertension (mostly WHO Class III related to DHF)).   Dominance: Right.  POSTOP CATH FINDINGS Severe multivessel CAD:  Notably proximal RCA 95% ulcerated stenosis as culprit for stress test followed by 80% distal;  long segment in the LAD after 1st Diag with 60%, 70% and 90% stenoses  Prox 1st Diag with 90% stenosis, previously placed AV groove circumflex stent has 80% ISR (not target for bypass) By catheterization, the aortic valve gradient is not significant. Moderately elevated LVEDP and PCWP of 23-24 mmHg suggestive of Diastolic Heart Failure    ECHOCARDIOGRAM REPORT       Patient Name:   Samuel Chambers Date of Exam: 01/07/2024  Medical Rec #:  [id number]          Height:       76.0 in  Accession #:    [id number]  Weight:       266.3 lb  Date of Birth:  1951/05/17           BSA:          2.502 m  Patient Age:    72 years           BP:           159/76 mmHg  Patient Gender: M                  HR:           79 bpm.  Exam Location:  Inpatient   Procedure: 2D Echo, Cardiac Doppler and Color Doppler (Both Spectral and  Color            Flow Doppler were utilized during procedure).   Indications:    I35.0 Nonrheumatic aortic (valve) stenosis    History:        Patient has prior history of Echocardiogram examinations,  most                 recent 02/11/2023.    Sonographer:    Andrena Bang  Referring Phys: CALLIE E GOODRICH   IMPRESSIONS     1. Left ventricular ejection fraction, by estimation, is 60 to 65%. The  left ventricle has normal function. The  left ventricle has no regional  wall motion abnormalities. There is mild concentric left ventricular  hypertrophy. Left ventricular diastolic  parameters are indeterminate.   2. Right ventricular systolic function is normal. The right ventricular  size is normal.   3. The mitral valve is normal in structure. Trivial mitral valve  regurgitation. No evidence of mitral stenosis.   4. The aortic valve is calcified. There is moderate calcification of the  aortic valve. There is moderate thickening of the aortic valve. Aortic  valve regurgitation is mild. Moderate aortic valve stenosis. Aortic valve  mean gradient measures 21.0 mmHg.  Aortic valve Vmax measures 3.26 m/s.   5. Cannot exclude a small PFO.   Comparison(s): No significant change from prior study.   FINDINGS   Left Ventricle: Left ventricular ejection fraction, by estimation, is 60  to 65%. The left ventricle has normal function. The left ventricle has no  regional wall motion abnormalities. Definity  contrast agent was given IV  to delineate the left ventricular   endocardial borders. The left ventricular internal cavity size was normal  in size. There is mild concentric left ventricular hypertrophy. Left  ventricular diastolic parameters are indeterminate.   Right Ventricle: The right ventricular size is normal. No increase in  right ventricular wall thickness. Right ventricular systolic function is  normal.   Left Atrium: Left atrial size was normal in size.   Right Atrium: Right atrial size was normal in size.   Pericardium: There is no evidence of pericardial effusion.   Mitral Valve: The mitral valve is normal in structure. Trivial mitral  valve regurgitation. No evidence of mitral valve stenosis.   Tricuspid Valve: The tricuspid valve is grossly normal. Tricuspid valve  regurgitation is trivial. No evidence of tricuspid stenosis.   Aortic Valve: The aortic valve is calcified. There is moderate  calcification of  the aortic valve. There is moderate thickening of the  aortic valve. Aortic valve regurgitation is mild. Moderate aortic stenosis  is present. Aortic valve mean gradient  measures 21.0 mmHg. Aortic valve peak gradient measures 42.5 mmHg. Aortic  valve area, by VTI measures 1.64 cm.   Pulmonic Valve: The  pulmonic valve was not well visualized. Pulmonic valve  regurgitation is mild. No evidence of pulmonic stenosis.   Aorta: The aortic root and ascending aorta are structurally normal, with  no evidence of dilitation. Ascending aorta measurements are within normal  limits for age when indexed to body surface area.   Venous: The inferior vena cava was not well visualized.   IAS/Shunts: Cannot exclude a small PFO.     LEFT VENTRICLE  PLAX 2D  LVIDd:         4.60 cm      Diastology  LVIDs:         2.90 cm      LV e' medial:    5.77 cm/s  LV PW:         1.50 cm      LV E/e' medial:  16.2  LV IVS:        1.20 cm      LV e' lateral:   9.68 cm/s  LVOT diam:     2.20 cm      LV E/e' lateral: 9.6  LV SV:         116  LV SV Index:   46  LVOT Area:     3.80 cm    LV Volumes (MOD)  LV vol d, MOD A2C: 103.0 ml  LV vol d, MOD A4C: 127.0 ml  LV vol s, MOD A2C: 28.0 ml  LV vol s, MOD A4C: 33.7 ml  LV SV MOD A2C:     75.0 ml  LV SV MOD A4C:     127.0 ml  LV SV MOD BP:      85.9 ml   RIGHT VENTRICLE  RV S prime:     18.00 cm/s  TAPSE (M-mode): 2.2 cm   LEFT ATRIUM             Index  LA diam:        4.80 cm 1.92 cm/m  LA Vol (A2C):   52.7 ml 21.06 ml/m  LA Vol (A4C):   41.1 ml 16.43 ml/m  LA Biplane Vol: 48.3 ml 19.31 ml/m   AORTIC VALVE  AV Area (Vmax):    1.53 cm  AV Area (Vmean):   1.42 cm  AV Area (VTI):     1.64 cm  AV Vmax:           326.00 cm/s  AV Vmean:          213.000 cm/s  AV VTI:            0.708 m  AV Peak Grad:      42.5 mmHg  AV Mean Grad:      21.0 mmHg  LVOT Vmax:         131.00 cm/s  LVOT Vmean:        79.800 cm/s  LVOT VTI:          0.305 m  LVOT/AV  VTI ratio: 0.43    AORTA  Ao Asc diam: 3.80 cm   MITRAL VALVE  MV Area (PHT): 3.13 cm    SHUNTS  MV Decel Time: 242 msec    Systemic VTI:  0.30 m  MV E velocity: 93.30 cm/s  Systemic Diam: 2.20 cm  MV A velocity: 82.30 cm/s  MV E/A ratio:  1.13   Sheryle Donning MD  Electronically signed by Sheryle Donning MD  Signature Date/Time: 01/07/2024/1:55:49 PM        Final     Treatments: Surgery  DATE OF PROCEDURE: 01/09/2024   PREOPERATIVE DIAGNOSIS: Three-vessel coronary artery disease with moderate aortic stenosis.   POSTOPERATIVE DIAGNOSIS: Three-vessel coronary artery disease with moderate aortic stenosis.   PROCEDURE: Median sternotomy, extracorporeal circulation, coronary artery bypass grafting x3 (left internal mammary artery to LAD, saphenous vein graft to first diagonal, saphenous vein graft to posterior descending). Endoscopic vein harvest right leg.   SURGEON: Milon Aloe. Luna Salinas, MD.   ASSISTANT: Valaria Garland, PA.   ANESTHESIA: General.   FINDINGS: Transesophageal echocardiography revealed moderate aortic stenosis with preserved left ventricular systolic function, gradient of 22 mmHg. Function unchanged post bypass. Good quality conduits. Good quality targets. No graftable vessel in the  distal circumflex distribution.   Discharge Exam: Blood pressure 134/67, pulse 64, temperature 97.8 F (36.6 C), temperature source Oral, resp. rate 18, height 6' 4 (1.93 m), weight 123.4 kg, SpO2 99%. General appearance: alert, cooperative, and no distress Neurologic: intact Heart: regular rate and rhythm, no a-fib past 24 hours. Lungs: clear breath sounds. Mild left basilar ATX on CXR yesterday.  Abdomen: soft, non-tender Extremities: edema 1+ BLE Wound: the sternotomy incision and RLE EVH incisions are clean and dry without sign of infection.  Sutures are in place at the CT insertions sites without erythema or drainage. (Leaving the sutures in place for  now.)   Discharge Medications:  The patient has been discharged on:   1.Beta Blocker:  Yes [  X ]                              No   [   ]                              If No, reason:  2.Ace Inhibitor/ARB: Yes [ x  ]                                     No  [    ]                                     If No, reason:  3.Statin:   Yes [   ]                  No  [  X ]                  If No, reason: intolerance  4.Libby ReeValri Gee  [  X ]                  No   [   ]                  If No, reason:  Patient had ACS upon admission: No  Plavix /P2Y12 inhibitor: Yes [   ]                                      No  [ x ]     Discharge Instructions     AMB Referral to [facility]   Complete by: As directed    Post-ACS, urgent   Reason For Referral: Lipids  Amb Referral to Cardiac Rehabilitation   Complete by: As directed    Diagnosis: CABG   CABG X ___: 3   After initial evaluation and assessments completed: Virtual Based Care may be provided alone or in conjunction with Phase 2 Cardiac Rehab based on patient barriers.: Yes   Intensive Cardiac Rehabilitation (ICR) [facility] location only OR Traditional Cardiac Rehabilitation (TCR) *If criteria for ICR are not met will enroll in TCR [facility] only): Yes      Allergies as of 01/14/2024       Reactions   Amlodipine  Swelling   Patient had severe swelling   Crestor [rosuvastatin] Other (See Comments)   myalgias   Silodosin Palpitations   Valsartan  Other (See Comments)   Made patient feel tired and no energy   Warfarin And Related Palpitations   Fast heart beat, went down hill, felt like he was dying        Medication List     TAKE these medications    acetaminophen  500 MG tablet Commonly known as: TYLENOL  Take 1,000 mg by mouth every 6 (six) hours as needed for moderate pain (pain score 4-6).   amiodarone 200 MG tablet Commonly known as: PACERONE Take 2 tablets (400 mg total) by mouth 2 (two) times daily. For 5 days the  decrease the dose to 1 tablet (200mg ) twice daily for 2 weeks  then decrease the dose to 1 tablet (200mg ) once daily.   aspirin  EC 325 MG tablet Take 1 tablet (325 mg total) by mouth daily. What changed:  medication strength how much to take additional instructions   DULoxetine  60 MG capsule Commonly known as: CYMBALTA  TAKE 1 CAPSULE BY MOUTH EVERY DAY   guaiFENesin  600 MG 12 hr tablet Commonly known as: MUCINEX  Take 1 tablet (600 mg total) by mouth 2 (two) times daily.   hydrochlorothiazide  25 MG tablet Commonly known as: HYDRODIURIL  Take 1 tablet (25 mg total) by mouth daily.   losartan  100 MG tablet Commonly known as: COZAAR  Take 1 tablet (100 mg total) by mouth daily.   metoprolol  succinate 50 MG 24 hr tablet Commonly known as: TOPROL -XL Take 1 tablet (50 mg total) by mouth daily.   oxyCODONE  5 MG immediate release tablet Commonly known as: Roxicodone  Take 1 tablet (5 mg total) by mouth every 6 (six) hours as needed for up to 5 days for severe pain (pain score 7-10).        Follow-up Information     Zelphia Higashi, MD Follow up on 02/10/2024.   Specialty: Cardiothoracic Surgery Why: Follow up appointment is at 9:30AM, please get a chest xray at 8:30AM on the 2nd floor of our building. Contact information: [address] [place] [address] [phone]         West, Katlyn D, NP Follow up on 01/28/2024.   Specialty: Cardiology Why: Cardiology appointment is at 3:35PM Contact information: [address] [place] [address] [phone]         [facility] [employer]. [employer]. Go to.   Specialty: Cardiothoracic Surgery Why: Our office will contact you regarding appointment for suture removal in about 1 week. Contact information: [address] [place]  [address] [phone]                Signed:  Leata Providence, PA-C  01/14/2024, 8:29  AM

## 2024-01-09 NOTE — Anesthesia Procedure Notes (Signed)
 Central Venous Catheter Insertion Performed by: Juventino Oppenheim, MD, anesthesiologist Start/End6/13/2025 7:05 AM, 01/09/2024 7:25 AM Patient location: Pre-op. Preanesthetic checklist: patient identified, IV checked, risks and benefits discussed, surgical consent, monitors and equipment checked, pre-op evaluation, timeout performed and anesthesia consent Position: Trendelenburg Lidocaine  1% used for infiltration and patient sedated Hand hygiene performed , maximum sterile barriers used  and Seldinger technique used Catheter size: 8.5 Fr Central line was placed.MAC introducer Procedure performed using ultrasound guided technique. Ultrasound Notes:anatomy identified, needle tip was noted to be adjacent to the nerve/plexus identified, no ultrasound evidence of intravascular and/or intraneural injection and image(s) printed for medical record Attempts: 2 Following insertion, line sutured, dressing applied and Biopatch. Post procedure assessment: blood return through all ports, no air and free fluid flow  Patient tolerated the procedure well with no immediate complications.

## 2024-01-09 NOTE — Discharge Instructions (Signed)

## 2024-01-09 NOTE — Interval H&P Note (Signed)
 History and Physical Interval Note:  01/09/2024 7:13 AM  Samuel Chambers  has presented today for surgery, with the diagnosis of CAD, AS.  The various methods of treatment have been discussed with the patient and family. After consideration of risks, benefits and other options for treatment, the patient has consented to  Procedure(s) with comments: CORONARY ARTERY BYPASS GRAFTING (CABG) (N/A) REPLACEMENT, AORTIC VALVE, OPEN (N/A) - possible AVR ECHOCARDIOGRAM, TRANSESOPHAGEAL (N/A) as a surgical intervention.  The patient's history has been reviewed, patient examined, no change in status, stable for surgery.  I have reviewed the patient's chart and labs.  Questions were answered to the patient's satisfaction.     Zelphia Higashi

## 2024-01-10 ENCOUNTER — Inpatient Hospital Stay (HOSPITAL_COMMUNITY)

## 2024-01-10 LAB — GLUCOSE, CAPILLARY
Glucose-Capillary: 116 mg/dL — ABNORMAL HIGH (ref 70–99)
Glucose-Capillary: 117 mg/dL — ABNORMAL HIGH (ref 70–99)
Glucose-Capillary: 122 mg/dL — ABNORMAL HIGH (ref 70–99)
Glucose-Capillary: 125 mg/dL — ABNORMAL HIGH (ref 70–99)
Glucose-Capillary: 131 mg/dL — ABNORMAL HIGH (ref 70–99)
Glucose-Capillary: 133 mg/dL — ABNORMAL HIGH (ref 70–99)
Glucose-Capillary: 134 mg/dL — ABNORMAL HIGH (ref 70–99)
Glucose-Capillary: 143 mg/dL — ABNORMAL HIGH (ref 70–99)
Glucose-Capillary: 145 mg/dL — ABNORMAL HIGH (ref 70–99)
Glucose-Capillary: 150 mg/dL — ABNORMAL HIGH (ref 70–99)
Glucose-Capillary: 152 mg/dL — ABNORMAL HIGH (ref 70–99)
Glucose-Capillary: 159 mg/dL — ABNORMAL HIGH (ref 70–99)

## 2024-01-10 LAB — BASIC METABOLIC PANEL WITH GFR
Anion gap: 10 (ref 5–15)
Anion gap: 8 (ref 5–15)
BUN: 10 mg/dL (ref 8–23)
BUN: 11 mg/dL (ref 8–23)
CO2: 21 mmol/L — ABNORMAL LOW (ref 22–32)
CO2: 24 mmol/L (ref 22–32)
Calcium: 8 mg/dL — ABNORMAL LOW (ref 8.9–10.3)
Calcium: 8 mg/dL — ABNORMAL LOW (ref 8.9–10.3)
Chloride: 107 mmol/L (ref 98–111)
Chloride: 105 mmol/L (ref 98–111)
Creatinine, Ser: 0.98 mg/dL (ref 0.61–1.24)
Creatinine, Ser: 1.07 mg/dL (ref 0.61–1.24)
GFR, Estimated: >60 mL/min (ref >60)
GFR, Estimated: >60 mL/min (ref >60)
Glucose, Bld: 118 mg/dL — ABNORMAL HIGH (ref 70–99)
Glucose, Bld: 140 mg/dL — ABNORMAL HIGH (ref 70–99)
Potassium: 4.6 mmol/L (ref 3.5–5.1)
Potassium: 4.1 mmol/L (ref 3.5–5.1)
Sodium: 138 mmol/L (ref 135–145)
Sodium: 137 mmol/L (ref 135–145)

## 2024-01-10 LAB — CBC
HCT: 33.7 % — ABNORMAL LOW (ref 39.0–52.0)
HCT: 34 % — ABNORMAL LOW (ref 39.0–52.0)
Hemoglobin: 11.2 g/dL — ABNORMAL LOW (ref 13.0–17.0)
Hemoglobin: 11.1 g/dL — ABNORMAL LOW (ref 13.0–17.0)
MCH: 29.9 pg (ref 26.0–34.0)
MCH: 30 pg (ref 26.0–34.0)
MCHC: 33.2 g/dL (ref 30.0–36.0)
MCHC: 32.6 g/dL (ref 30.0–36.0)
MCV: 90.1 fL (ref 80.0–100.0)
MCV: 91.9 fL (ref 80.0–100.0)
Platelets: 172 10*3/uL (ref 150–400)
Platelets: 157 10*3/uL (ref 150–400)
RBC: 3.74 MIL/uL — ABNORMAL LOW (ref 4.22–5.81)
RBC: 3.7 MIL/uL — ABNORMAL LOW (ref 4.22–5.81)
RDW: 14.2 % (ref 11.5–15.5)
RDW: 14.6 % (ref 11.5–15.5)
WBC: 13.6 10*3/uL — ABNORMAL HIGH (ref 4.0–10.5)
WBC: 13.7 10*3/uL — ABNORMAL HIGH (ref 4.0–10.5)
nRBC: 0 % (ref 0.0–0.2)
nRBC: 0 % (ref 0.0–0.2)

## 2024-01-10 LAB — MAGNESIUM
Magnesium: 2.7 mg/dL — ABNORMAL HIGH (ref 1.7–2.4)
Magnesium: 2.5 mg/dL — ABNORMAL HIGH (ref 1.7–2.4)

## 2024-01-10 MED ORDER — FUROSEMIDE 10 MG/ML IJ SOLN
40.0000 mg | Freq: Two times a day (BID) | INTRAMUSCULAR | Status: AC
  Administered 2024-01-10 (×2): 40 mg via INTRAVENOUS
  Filled 2024-01-10 (×2): qty 4

## 2024-01-10 MED ORDER — ENOXAPARIN SODIUM 40 MG/0.4ML IJ SOSY
40.0000 mg | PREFILLED_SYRINGE | Freq: Every day | INTRAMUSCULAR | Status: DC
  Administered 2024-01-10: 40 mg via SUBCUTANEOUS
  Filled 2024-01-10: qty 0.4

## 2024-01-10 MED ORDER — AMIODARONE HCL IN DEXTROSE 360-4.14 MG/200ML-% IV SOLN
60.0000 mg/h | INTRAVENOUS | Status: AC
  Administered 2024-01-10: 60 mg/h via INTRAVENOUS

## 2024-01-10 MED ORDER — AMIODARONE HCL 200 MG PO TABS
400.0000 mg | ORAL_TABLET | Freq: Two times a day (BID) | ORAL | Status: DC
  Administered 2024-01-10: 400 mg via ORAL
  Filled 2024-01-10: qty 2

## 2024-01-10 MED ORDER — INSULIN ASPART 100 UNIT/ML IJ SOLN: 0.0000 [IU] | INTRAMUSCULAR | Status: DC

## 2024-01-10 MED ORDER — AMIODARONE LOAD VIA INFUSION: 150.0000 mg | Freq: Once | INTRAVENOUS | Status: AC

## 2024-01-10 MED ORDER — AMIODARONE HCL IN DEXTROSE 360-4.14 MG/200ML-% IV SOLN
30.0000 mg/h | INTRAVENOUS | Status: AC
  Administered 2024-01-11 – 2024-01-12 (×3): 30 mg/h via INTRAVENOUS
  Filled 2024-01-10 (×4): qty 200

## 2024-01-10 MED ORDER — AMIODARONE HCL IN DEXTROSE 360-4.14 MG/200ML-% IV SOLN
INTRAVENOUS | Status: AC
  Administered 2024-01-10: 150 mg via INTRAVENOUS
  Filled 2024-01-10: qty 200

## 2024-01-10 MED ORDER — KETOROLAC TROMETHAMINE 15 MG/ML IJ SOLN
15.0000 mg | Freq: Four times a day (QID) | INTRAMUSCULAR | Status: DC | PRN
  Administered 2024-01-10 (×2): 15 mg via INTRAVENOUS
  Filled 2024-01-10 (×2): qty 1

## 2024-01-10 MED ORDER — INSULIN ASPART 100 UNIT/ML IJ SOLN
0.0000 [IU] | INTRAMUSCULAR | Status: DC
  Administered 2024-01-10 – 2024-01-11 (×6): 2 [IU] via SUBCUTANEOUS

## 2024-01-10 NOTE — Op Note (Signed)
 NAME: Samuel Chambers, Samuel Chambers MEDICAL RECORD NO: [id number] ACCOUNT NO: [id number] DATE OF BIRTH: 1951/03/03 FACILITY: [facility] LOCATION: [facility] PHYSICIAN: Elspeth BROCKS. Kerrin, MD  Operative Report   DATE OF PROCEDURE: 01/09/2024  PREOPERATIVE DIAGNOSIS: Three-vessel coronary artery disease with moderate aortic stenosis.  POSTOPERATIVE DIAGNOSIS: Three-vessel coronary artery disease with moderate aortic stenosis.  PROCEDURE:  Median sternotomy, extracorporeal circulation,  Coronary artery bypass grafting x3  Left internal mammary artery to LAD,  Saphenous vein graft to first diagonal,  Saphenous vein graft to posterior descending  Endoscopic vein harvest right leg.  SURGEON: Elspeth BROCKS. Kerrin, MD.  ASSISTANT: Con Helm, PA.  Experienced assistance was necessary for this case due to surgical complexity. Con Standard independently harvested the saphenous vein endoscopically and closed the leg incision. She then assisted with exposure, retraction of delicate tissues, suctioning, and suture management during the anastomoses.   ANESTHESIA: General.  FINDINGS: Transesophageal echocardiography revealed moderate aortic stenosis with preserved left ventricular systolic function, gradient of 22 mmHg. Function unchanged post bypass. Good quality conduits. Good quality targets. No graftable vessel in the distal circumflex distribution.  OPERATIVE NOTE: Samuel Chambers was brought to the operating room on 01/09/2024. He had induction of general anesthesia. Swan-Ganz catheter was placed by Dr. Lucious of Anesthesiology. A Foley catheter was placed. Intravenous antibiotics were administered. The chest, abdomen, and legs were prepped and draped in the usual sterile fashion. Dr. Lucious performed transesophageal echocardiography. Please see his separately dictated note for full details of that procedure.  A timeout was performed. An incision was made in the medial aspect of the right leg at the level of the  knee. The greater saphenous vein was identified. It was harvested endoscopically by Con Helm, who then closed the leg incisions. Simultaneously, a median sternotomy was performed and the left internal mammary artery was harvested using standard technique. The mammary artery was a good quality vessel with excellent flow when divided distally. The vein was also of good quality. 2000 units of heparin  were administered during the vessel harvest. The remainder of the full heparin  dose was given prior to opening the pericardium. The decision had been made based on the intraoperative transesophageal echo not to proceed with aortic valve replacement in the setting of poor dentition with only moderate stenosis and option for TAVR as a treatment later on.  Sternal retractor was placed and was gradually opened over time. The pericardium was opened. The ascending aorta was inspected. There was no evidence of atherosclerotic disease. The remainder of the full heparin  dose was given. After confirming adequate anticoagulation with ACT measurement, the aorta was cannulated via concentric 2-0 Ethibond pledgeted pursestring sutures. A dual-stage venous cannula was placed via a pursestring suture in the right atrial appendage. Cardiopulmonary bypass was initiated.  Flows were maintained per protocol. The patient was cooled to 32 degrees Celsius. The coronary arteries were inspected and anastomotic sites were chosen. The conduits were inspected and cut to length. A foam pad was placed in the pericardium to insulate the heart. A temperature probe was placed in the myocardial septum and a cardioplegia cannula was placed in the ascending aorta.  The aorta was cross-clamped. The left ventricle was emptied via the aortic root vent. Cardiac arrest then was achieved with a combination of cold antegrade blood cardioplegia and topical ice saline. One liter of cardioplegia was administered. There was rapid diastolic arrest and there  was septal cooling to 11 degrees Celsius.  A reversed saphenous vein graft was placed end to side of the posterior  descending branch of the right coronary. This was a 1.5 mm vessel. There was plaque proximal to the anastomosis, but it was good quality distally. The vein was anastomosed end-to-side with a running 7-0 Prolene suture. All anastomoses were probed proximally and distally at their completion to ensure patency. Cardioplegia was administered down the graft and there was good flow and good hemostasis.  A reversed saphenous vein graft was placed end-to-side to the first diagonal branch of the LAD. This was a 1.5 mm vessel. It was more diffusely diseased, but was free of disease distal to the anastomosis. The vein was anastomosed end-to-side with a running 7-0 Prolene suture. A probe passed easily proximally and distally. Cardioplegia was administered and there was good flow and good hemostasis.  Additional cardioplegia was administered down the aortic root. The left internal mammary artery was brought through a window in the pericardium. The distal end was beveled. It was a 2 mm good quality conduit. It was anastomosed end-to-side to the distal LAD. The LAD was a 2 mm good quality target. The mammary to LAD anastomosis was performed with a running 8-0 Prolene suture. At completion of the anastomosis, the bulldog clamp was briefly removed to inspect for hemostasis. Rapid septal rewarming was  noted. The bulldog clamp was replaced. The mammary pedicle was tacked to the epicardial surface of the heart with 6-0 Prolene sutures.  The vein grafts were cut to length. The cardioplegia cannula was removed from the ascending aorta. The proximal vein graft anastomoses were performed to 4.5 mm punch aortotomies with running 6-0 Prolene sutures. At the completion of the final proximal anastomosis, the patient was placed in Trendelenburg position. Lidocaine  was administered. The bulldog clamp was again removed  from the left mammary artery. The aortic root was de-aired and the aortic cross-clamp was removed. The total cross-clamp time  was 52 minutes. The patient spontaneously resumed sinus rhythm. He did not require defibrillation.  While rewarming was completed, all proximal and distal anastomoses were inspected for hemostasis. Epicardial pacing wires were placed on the right ventricle and right atrium. The patient was in sinus bradycardia and he was DDD paced at 80 beats per minute. He weaned from bypass on the first attempt. Total bypass time was 81 minutes. The initial cardiac index was greater than 2 L/min/m2 and he remained hemodynamically stable throughout the post-bypass period.  A test dose of Protamine  was administered and was well tolerated. The arterial and aortic cannulae were removed. The remainder of the protamine  was administered without incident. The chest was irrigated with warm saline. Hemostasis was achieved. Left pleural and mediastinal chest tubes were placed through separate subcostal incisions. The sternum was closed with a combination of single and double heavy-gauge stainless steel wires. The pectoralis fascia and subcutaneous tissue and skin were closed in standard fashion. All sponge, needle, and instrument counts were correct at the end of the procedure. The patient was transported from the operating room to the surgical intensive care unit, intubated and in good condition.    SUJ D: 01/09/2024 3:21:39 pm T: 01/10/2024 12:41:00 am  JOB: [id number]/ [id number]

## 2024-01-10 NOTE — Plan of Care (Signed)
  Problem: Education: Goal: Understanding of CV disease, CV risk reduction, and recovery process will improve Outcome: Progressing Goal: Individualized Educational Video(s) Outcome: Progressing   Problem: Activity: Goal: Ability to return to baseline activity level will improve Outcome: Progressing   Problem: Education: Goal: Knowledge of General Education information will improve Description: Including pain rating scale, medication(s)/side effects and non-pharmacologic comfort measures Outcome: Progressing   Problem: Health Behavior/Discharge Planning: Goal: Ability to manage health-related needs will improve Outcome: Progressing

## 2024-01-10 NOTE — Progress Notes (Signed)
 1 Day Post-Op Procedure(s) (LRB): CORONARY ARTERY BYPASS GRAFTING (CABG) TIMES THREE UTILIZING LEFT INTERNAL MAMMARY ARTERY, ENDOSCOPIC VEIN HARVEST RIGHT GREATER SAPHENOUS VEIN (N/A) ECHOCARDIOGRAM, TRANSESOPHAGEAL (N/A) Subjective:  Complains of incisional pain.  Objective: Vital signs in last 24 hours: Temp:  [96.6 F (35.9 C)-99.7 F (37.6 C)] 99.1 F (37.3 C) (06/14 0700) Pulse Rate:  [64-94] 79 (06/14 0715) Cardiac Rhythm: Normal sinus rhythm (06/13 2100) Resp:  [12-30] 14 (06/14 0715) BP: (95-133)/(55-75) 124/73 (06/14 0400) SpO2:  [91 %-99 %] 94 % (06/14 0715) Arterial Line BP: (84-169)/(35-77) 150/65 (06/14 0715) FiO2 (%):  [40 %-50 %] 40 % (06/13 1655) Weight:  [125.6 kg] 125.6 kg (06/14 0509)  Hemodynamic parameters for last 24 hours: PAP: (24-62)/(2-39) 44/23 CO:  [5.1 L/min-7.1 L/min] 5.7 L/min CI:  [2.1 L/min/m2-2.87 L/min/m2] 2.28 L/min/m2  Intake/Output from previous day: 06/13 0701 - 06/14 0700 In: 6097.3 [P.O.:150; I.V.:3286; Blood:465; IV Piggyback:2196.3] Out: 3985 [Urine:2695; Blood:800; Chest Tube:490] Intake/Output this shift: No intake/output data recorded.  General appearance: alert and cooperative Neurologic: intact Heart: regular rate and rhythm Lungs: clear to auscultation bilaterally Extremities: edema mild Wound: dressings dry  Lab Results: Recent Labs    01/09/24 1900 01/10/24 0507  WBC 11.8* 13.6*  HGB 10.7* 11.2*  HCT 31.6* 33.7*  PLT 155 172   BMET:  Recent Labs    01/09/24 1900 01/10/24 0507  NA 138 138  K 4.2 4.6  CL 107 107  CO2 22 21*  GLUCOSE 123* 118*  BUN 10 10  CREATININE 0.92 0.98  CALCIUM 7.9* 8.0*    PT/INR:  Recent Labs    01/09/24 1322  LABPROT 16.5*  INR 1.3*   ABG    Component Value Date/Time   PHART 7.305 (L) 01/09/2024 1816   HCO3 21.5 01/09/2024 1816   TCO2 23 01/09/2024 1816   ACIDBASEDEF 5.0 (H) 01/09/2024 1816   O2SAT 94 01/09/2024 1816   CBG (last 3)  Recent Labs     01/10/24 0305 01/10/24 0507 01/10/24 0703  GLUCAP 125* 116* 122*   CXR: low lung volume, atelectasis in lower lobes.  ECG: NSR, no acute changes.  Assessment/Plan: S/P Procedure(s) (LRB): CORONARY ARTERY BYPASS GRAFTING (CABG) TIMES THREE UTILIZING LEFT INTERNAL MAMMARY ARTERY, ENDOSCOPIC VEIN HARVEST RIGHT GREATER SAPHENOUS VEIN (N/A) ECHOCARDIOGRAM, TRANSESOPHAGEAL (N/A)  POD 1 Hemodynamically stable in sinus rhythm. Continue low dose Lopressor . DC swan, arterial line.  Start diuresis.  Glucose under good control. Transition to SSI. Preop Hgb A1c 5.9 on no meds.  Will keep chest tubes this morning and may be able to remove later today.  IS, OOB, mobilize.  Add Toradol for pain.   LOS: 4 days    Samuel Chambers 01/10/2024

## 2024-01-10 NOTE — Plan of Care (Signed)
  Problem: Education: Goal: Understanding of CV disease, CV risk reduction, and recovery process will improve 01/10/2024 0624 by Alric Jensen, RN Outcome: Progressing 01/10/2024 0624 by Alric Jensen, RN Outcome: Progressing Goal: Individualized Educational Video(s) 01/10/2024 0624 by Alric Jensen, RN Outcome: Progressing 01/10/2024 0624 by Alric Jensen, RN Outcome: Progressing   Problem: Activity: Goal: Ability to return to baseline activity level will improve Outcome: Progressing   Problem: Cardiovascular: Goal: Ability to achieve and maintain adequate cardiovascular perfusion will improve Outcome: Progressing Goal: Vascular access site(s) Level 0-1 will be maintained Outcome: Progressing   Problem: Education: Goal: Knowledge of General Education information will improve Description: Including pain rating scale, medication(s)/side effects and non-pharmacologic comfort measures Outcome: Progressing   Problem: Health Behavior/Discharge Planning: Goal: Ability to manage health-related needs will improve Outcome: Progressing

## 2024-01-11 ENCOUNTER — Inpatient Hospital Stay (HOSPITAL_COMMUNITY)

## 2024-01-11 LAB — CBC
HCT: 33.3 % — ABNORMAL LOW (ref 39.0–52.0)
Hemoglobin: 10.9 g/dL — ABNORMAL LOW (ref 13.0–17.0)
MCH: 30.1 pg (ref 26.0–34.0)
MCHC: 32.7 g/dL (ref 30.0–36.0)
MCV: 92 fL (ref 80.0–100.0)
Platelets: 167 10*3/uL (ref 150–400)
RBC: 3.62 MIL/uL — ABNORMAL LOW (ref 4.22–5.81)
RDW: 14.6 % (ref 11.5–15.5)
WBC: 16.1 10*3/uL — ABNORMAL HIGH (ref 4.0–10.5)
nRBC: 0.2 % (ref 0.0–0.2)

## 2024-01-11 LAB — BASIC METABOLIC PANEL WITH GFR
Anion gap: 9 (ref 5–15)
BUN: 13 mg/dL (ref 8–23)
CO2: 25 mmol/L (ref 22–32)
Calcium: 8 mg/dL — ABNORMAL LOW (ref 8.9–10.3)
Chloride: 102 mmol/L (ref 98–111)
Creatinine, Ser: 1.13 mg/dL (ref 0.61–1.24)
GFR, Estimated: >60 mL/min (ref >60)
Glucose, Bld: 142 mg/dL — ABNORMAL HIGH (ref 70–99)
Potassium: 4 mmol/L (ref 3.5–5.1)
Sodium: 136 mmol/L (ref 135–145)

## 2024-01-11 LAB — GLUCOSE, CAPILLARY
Glucose-Capillary: 142 mg/dL — ABNORMAL HIGH (ref 70–99)
Glucose-Capillary: 133 mg/dL — ABNORMAL HIGH (ref 70–99)
Glucose-Capillary: 148 mg/dL — ABNORMAL HIGH (ref 70–99)

## 2024-01-11 MED ORDER — FUROSEMIDE 10 MG/ML IJ SOLN
40.0000 mg | Freq: Two times a day (BID) | INTRAMUSCULAR | Status: DC
  Administered 2024-01-11: 40 mg via INTRAVENOUS
  Filled 2024-01-11: qty 4

## 2024-01-11 MED ORDER — SODIUM CHLORIDE 0.9 % IV SOLN
12.5000 mg | Freq: Four times a day (QID) | INTRAVENOUS | Status: DC | PRN
  Administered 2024-01-12: 12.5 mg via INTRAVENOUS
  Filled 2024-01-11: qty 0.5

## 2024-01-11 MED ORDER — METOPROLOL TARTRATE 25 MG/10 ML ORAL SUSPENSION
25.0000 mg | Freq: Two times a day (BID) | ORAL | Status: DC
  Filled 2024-01-11: qty 10

## 2024-01-11 MED ORDER — METOPROLOL TARTRATE 25 MG PO TABS
25.0000 mg | ORAL_TABLET | Freq: Two times a day (BID) | ORAL | Status: DC
  Administered 2024-01-11 (×2): 25 mg via ORAL
  Filled 2024-01-11 (×2): qty 1

## 2024-01-11 MED ORDER — METOCLOPRAMIDE HCL 5 MG/ML IJ SOLN
10.0000 mg | Freq: Four times a day (QID) | INTRAMUSCULAR | Status: AC
  Administered 2024-01-11 – 2024-01-12 (×4): 10 mg via INTRAVENOUS
  Filled 2024-01-11 (×4): qty 2

## 2024-01-11 MED ORDER — POTASSIUM CHLORIDE CRYS ER 20 MEQ PO TBCR
20.0000 meq | EXTENDED_RELEASE_TABLET | Freq: Three times a day (TID) | ORAL | Status: DC
  Administered 2024-01-11 (×2): 20 meq via ORAL
  Filled 2024-01-11 (×2): qty 1

## 2024-01-11 MED ORDER — INSULIN ASPART 100 UNIT/ML IJ SOLN
0.0000 [IU] | Freq: Three times a day (TID) | INTRAMUSCULAR | Status: DC
  Administered 2024-01-11 – 2024-01-13 (×5): 2 [IU] via SUBCUTANEOUS

## 2024-01-11 NOTE — Progress Notes (Signed)
 2 Days Post-Op Procedure(s) (LRB): CORONARY ARTERY BYPASS GRAFTING (CABG) TIMES THREE UTILIZING LEFT INTERNAL MAMMARY ARTERY, ENDOSCOPIC VEIN HARVEST RIGHT GREATER SAPHENOUS VEIN (N/A) ECHOCARDIOGRAM, TRANSESOPHAGEAL (N/A) Subjective: Feels better today. Able to eat a banana. Some cough and hiccups at times.  Went into atrial fib yesterday afternoon and started on amiodarone. Still AF 90's on IV amio.   Objective: Vital signs in last 24 hours: Temp:  [98.1 F (36.7 C)-99.7 F (37.6 C)] 98.1 F (36.7 C) (06/15 0730) Pulse Rate:  [55-132] 100 (06/15 0800) Cardiac Rhythm: Normal sinus rhythm (06/14 2100) Resp:  [11-26] 16 (06/15 0800) BP: (111-171)/(54-102) 129/90 (06/15 0800) SpO2:  [82 %-97 %] 96 % (06/15 0800) Weight:  [124.7 kg] 124.7 kg (06/15 0500)  Hemodynamic parameters for last 24 hours:    Intake/Output from previous day: 06/14 0701 - 06/15 0700 In: 778.3 [I.V.:478.3; IV Piggyback:300] Out: 1800 [Urine:1640; Chest Tube:160] Intake/Output this shift: Total I/O In: 16.7 [I.V.:16.7] Out: 100 [Urine:100]  General appearance: alert and cooperative Neurologic: intact Heart: irregularly irregular rhythm Lungs: clear to auscultation bilaterally Extremities: edema mild Wound: incision ok  Lab Results: Recent Labs    01/10/24 1635 01/11/24 0404  WBC 13.7* 16.1*  HGB 11.1* 10.9*  HCT 34.0* 33.3*  PLT 157 167   BMET:  Recent Labs    01/10/24 1635 01/11/24 0404  NA 137 136  K 4.1 4.0  CL 105 102  CO2 24 25  GLUCOSE 140* 142*  BUN 11 13  CREATININE 1.07 1.13  CALCIUM 8.0* 8.0*    PT/INR:  Recent Labs    01/09/24 1322  LABPROT 16.5*  INR 1.3*   ABG    Component Value Date/Time   PHART 7.305 (L) 01/09/2024 1816   HCO3 21.5 01/09/2024 1816   TCO2 23 01/09/2024 1816   ACIDBASEDEF 5.0 (H) 01/09/2024 1816   O2SAT 94 01/09/2024 1816   CBG (last 3)  Recent Labs    01/10/24 1937 01/10/24 2340 01/11/24 0358  GLUCAP 159* 152* 142*   CXR: low lung  volumes. May be some pulmonary vascular congestion.  Assessment/Plan: S/P Procedure(s) (LRB): CORONARY ARTERY BYPASS GRAFTING (CABG) TIMES THREE UTILIZING LEFT INTERNAL MAMMARY ARTERY, ENDOSCOPIC VEIN HARVEST RIGHT GREATER SAPHENOUS VEIN (N/A) ECHOCARDIOGRAM, TRANSESOPHAGEAL (N/A)  POD 2 Hemodynamically stable in rate controlled postop atrial fib. Will continue IV amio. Increase Lopressor  to 25 bid.  Wt is still about 13 lbs over preop. Continue lasix  and Kdur.  Glucose 140's-150's with preop Hgb A1c of 5.9 on no meds. Will continue SSI today. Needs attention to diet, exercise and wt loss at home.  Continue IS, ambulation.  DC foley. Will keep sleeve for IV amio today.  Discussed plans with wife at bedside.   LOS: 5 days    Samuel Chambers 01/11/2024

## 2024-01-11 NOTE — Plan of Care (Signed)
  Problem: Education: Goal: Understanding of CV disease, CV risk reduction, and recovery process will improve Outcome: Progressing Goal: Individualized Educational Video(s) Outcome: Progressing   Problem: Activity: Goal: Ability to return to baseline activity level will improve Outcome: Progressing   Problem: Cardiovascular: Goal: Ability to achieve and maintain adequate cardiovascular perfusion will improve Outcome: Progressing   Problem: Cardiovascular: Goal: Ability to achieve and maintain adequate cardiovascular perfusion will improve Outcome: Progressing Goal: Vascular access site(s) Level 0-1 will be maintained Outcome: Progressing   Problem: Health Behavior/Discharge Planning: Goal: Ability to safely manage health-related needs after discharge will improve Outcome: Progressing   Problem: Education: Goal: Knowledge of General Education information will improve Description: Including pain rating scale, medication(s)/side effects and non-pharmacologic comfort measures Outcome: Progressing   Problem: Health Behavior/Discharge Planning: Goal: Ability to manage health-related needs will improve Outcome: Progressing   Problem: Clinical Measurements: Goal: Ability to maintain clinical measurements within normal limits will improve Outcome: Progressing Goal: Will remain free from infection Outcome: Progressing Goal: Diagnostic test results will improve Outcome: Progressing Goal: Respiratory complications will improve Outcome: Progressing Goal: Cardiovascular complication will be avoided Outcome: Progressing   Problem: Activity: Goal: Risk for activity intolerance will decrease Outcome: Progressing   Problem: Nutrition: Goal: Adequate nutrition will be maintained Outcome: Progressing   Problem: Coping: Goal: Level of anxiety will decrease Outcome: Progressing   Problem: Elimination: Goal: Will not experience complications related to bowel motility Outcome:  Progressing Goal: Will not experience complications related to urinary retention Outcome: Progressing   Problem: Pain Managment: Goal: General experience of comfort will improve and/or be controlled Outcome: Progressing   Problem: Safety: Goal: Ability to remain free from injury will improve Outcome: Progressing   Problem: Skin Integrity: Goal: Risk for impaired skin integrity will decrease Outcome: Progressing   Problem: Education: Goal: Will demonstrate proper wound care and an understanding of methods to prevent future damage Outcome: Progressing Goal: Knowledge of disease or condition will improve Outcome: Progressing Goal: Knowledge of the prescribed therapeutic regimen will improve Outcome: Progressing Goal: Individualized Educational Video(s) Outcome: Progressing   Problem: Activity: Goal: Risk for activity intolerance will decrease Outcome: Progressing   Problem: Cardiac: Goal: Will achieve and/or maintain hemodynamic stability Outcome: Progressing   Problem: Clinical Measurements: Goal: Postoperative complications will be avoided or minimized Outcome: Progressing   Problem: Respiratory: Goal: Respiratory status will improve Outcome: Progressing   Problem: Skin Integrity: Goal: Wound healing without signs and symptoms of infection Outcome: Progressing Goal: Risk for impaired skin integrity will decrease Outcome: Progressing   Problem: Urinary Elimination: Goal: Ability to achieve and maintain adequate renal perfusion and functioning will improve Outcome: Progressing

## 2024-01-11 NOTE — Progress Notes (Signed)
 Patient ID: Samuel Chambers, male   DOB: [birth year], 73 y.o.   MRN: [id number]  TCTS Evening Rounds:  Hemodynamically stable in rate controlled AF this pm. Still on IV amio and lopressor .  Has had persistent nausea today not relieved with Zofran . Will resume Reglan  and use Phenergan  as needed. He is on IV amio which could be contributing.  Diuresed well today.  Ambulated.

## 2024-01-12 ENCOUNTER — Encounter (HOSPITAL_COMMUNITY): Payer: Self-pay | Admitting: Thoracic Surgery (Cardiothoracic Vascular Surgery)

## 2024-01-12 LAB — CBC
HCT: 29.3 % — ABNORMAL LOW (ref 39.0–52.0)
Hemoglobin: 9.8 g/dL — ABNORMAL LOW (ref 13.0–17.0)
MCH: 29.9 pg (ref 26.0–34.0)
MCHC: 33.4 g/dL (ref 30.0–36.0)
MCV: 89.3 fL (ref 80.0–100.0)
Platelets: 166 10*3/uL (ref 150–400)
RBC: 3.28 MIL/uL — ABNORMAL LOW (ref 4.22–5.81)
RDW: 14.5 % (ref 11.5–15.5)
WBC: 11 10*3/uL — ABNORMAL HIGH (ref 4.0–10.5)
nRBC: 0 % (ref 0.0–0.2)

## 2024-01-12 LAB — BASIC METABOLIC PANEL WITH GFR
Anion gap: 7 (ref 5–15)
BUN: 11 mg/dL (ref 8–23)
CO2: 26 mmol/L (ref 22–32)
Calcium: 7.6 mg/dL — ABNORMAL LOW (ref 8.9–10.3)
Chloride: 101 mmol/L (ref 98–111)
Creatinine, Ser: 0.92 mg/dL (ref 0.61–1.24)
GFR, Estimated: >60 mL/min (ref >60)
Glucose, Bld: 139 mg/dL — ABNORMAL HIGH (ref 70–99)
Potassium: 3.5 mmol/L (ref 3.5–5.1)
Sodium: 134 mmol/L — ABNORMAL LOW (ref 135–145)

## 2024-01-12 LAB — GLUCOSE, CAPILLARY
Glucose-Capillary: 101 mg/dL — ABNORMAL HIGH (ref 70–99)
Glucose-Capillary: 124 mg/dL — ABNORMAL HIGH (ref 70–99)
Glucose-Capillary: 121 mg/dL — ABNORMAL HIGH (ref 70–99)
Glucose-Capillary: 123 mg/dL — ABNORMAL HIGH (ref 70–99)

## 2024-01-12 MED ORDER — POTASSIUM CHLORIDE 10 MEQ/50ML IV SOLN
10.0000 meq | INTRAVENOUS | Status: AC
  Administered 2024-01-12 (×3): 10 meq via INTRAVENOUS
  Filled 2024-01-12 (×3): qty 50

## 2024-01-12 MED ORDER — METOPROLOL SUCCINATE ER 50 MG PO TB24
75.0000 mg | ORAL_TABLET | Freq: Every day | ORAL | Status: DC
  Administered 2024-01-12 – 2024-01-14 (×3): 75 mg via ORAL
  Filled 2024-01-12 (×3): qty 1

## 2024-01-12 MED ORDER — SODIUM CHLORIDE 0.9% FLUSH: 3.0000 mL | INTRAVENOUS | Status: DC | PRN

## 2024-01-12 MED ORDER — ~~LOC~~ CARDIAC SURGERY, PATIENT & FAMILY EDUCATION
Freq: Once | Status: AC
  Administered 2024-01-12: 1

## 2024-01-12 MED ORDER — ALUM & MAG HYDROXIDE-SIMETH 200-200-20 MG/5ML PO SUSP: 15.0000 mL | Freq: Four times a day (QID) | ORAL | Status: DC | PRN

## 2024-01-12 MED ORDER — MAGNESIUM HYDROXIDE 400 MG/5ML PO SUSP: 15.0000 mL | Freq: Every day | ORAL | Status: DC | PRN

## 2024-01-12 MED ORDER — SODIUM CHLORIDE 0.9% FLUSH
3.0000 mL | Freq: Two times a day (BID) | INTRAVENOUS | Status: DC
  Administered 2024-01-12 – 2024-01-13 (×4): 3 mL via INTRAVENOUS

## 2024-01-12 MED ORDER — AMIODARONE HCL 200 MG PO TABS
400.0000 mg | ORAL_TABLET | Freq: Two times a day (BID) | ORAL | Status: DC
  Administered 2024-01-12 – 2024-01-14 (×5): 400 mg via ORAL
  Filled 2024-01-12 (×5): qty 2

## 2024-01-12 MED ORDER — SODIUM CHLORIDE 0.9 % IV SOLN: 250.0000 mL | INTRAVENOUS | Status: AC | PRN

## 2024-01-12 MED ORDER — POTASSIUM CHLORIDE CRYS ER 10 MEQ PO TBCR
20.0000 meq | EXTENDED_RELEASE_TABLET | Freq: Every day | ORAL | Status: DC
  Administered 2024-01-12 – 2024-01-14 (×3): 20 meq via ORAL
  Filled 2024-01-12 (×4): qty 2

## 2024-01-12 MED ORDER — FUROSEMIDE 40 MG PO TABS
40.0000 mg | ORAL_TABLET | Freq: Every day | ORAL | Status: DC
  Administered 2024-01-12 – 2024-01-14 (×3): 40 mg via ORAL
  Filled 2024-01-12 (×3): qty 1

## 2024-01-12 MED ORDER — ENOXAPARIN SODIUM 40 MG/0.4ML IJ SOSY
40.0000 mg | PREFILLED_SYRINGE | INTRAMUSCULAR | Status: DC
  Administered 2024-01-12 – 2024-01-14 (×3): 40 mg via SUBCUTANEOUS
  Filled 2024-01-12 (×3): qty 0.4

## 2024-01-12 MED FILL — Heparin Sodium (Porcine) Inj 1000 Unit/ML: Qty: 1000 | Status: AC

## 2024-01-12 MED FILL — Potassium Chloride Inj 2 mEq/ML: INTRAVENOUS | Qty: 40 | Status: AC

## 2024-01-12 MED FILL — Magnesium Sulfate Inj 50%: INTRAMUSCULAR | Qty: 10 | Status: AC

## 2024-01-12 NOTE — Progress Notes (Signed)
 Patient arrived at the unit from 2H,upon arrival pt is alert and oriented X4,CHG bath given,vitals taken,CCMD notified,pt oriented to the unit,call bell in reach

## 2024-01-12 NOTE — Plan of Care (Signed)
  Problem: Education: Goal: Understanding of CV disease, CV risk reduction, and recovery process will improve Outcome: Progressing Goal: Individualized Educational Video(s) Outcome: Progressing   Problem: Activity: Goal: Ability to return to baseline activity level will improve Outcome: Progressing   Problem: Cardiovascular: Goal: Ability to achieve and maintain adequate cardiovascular perfusion will improve Outcome: Progressing Goal: Vascular access site(s) Level 0-1 will be maintained Outcome: Progressing   Problem: Health Behavior/Discharge Planning: Goal: Ability to safely manage health-related needs after discharge will improve Outcome: Progressing   Problem: Education: Goal: Knowledge of General Education information will improve Description: Including pain rating scale, medication(s)/side effects and non-pharmacologic comfort measures Outcome: Progressing   Problem: Health Behavior/Discharge Planning: Goal: Ability to manage health-related needs will improve Outcome: Progressing   Problem: Clinical Measurements: Goal: Diagnostic test results will improve Outcome: Progressing Goal: Respiratory complications will improve Outcome: Progressing Goal: Cardiovascular complication will be avoided Outcome: Progressing

## 2024-01-12 NOTE — Progress Notes (Signed)
 3 Days Post-Op Procedure(s) (LRB): CORONARY ARTERY BYPASS GRAFTING (CABG) TIMES THREE UTILIZING LEFT INTERNAL MAMMARY ARTERY, ENDOSCOPIC VEIN HARVEST RIGHT GREATER SAPHENOUS VEIN (N/A) ECHOCARDIOGRAM, TRANSESOPHAGEAL (N/A) Subjective: Nausea better, still with incisional pain  Objective: Vital signs in last 24 hours: Temp:  [97.5 F (36.4 C)-98.4 F (36.9 C)] 98.4 F (36.9 C) (06/15 2350) Pulse Rate:  [61-102] 68 (06/16 0145) Cardiac Rhythm: Atrial fibrillation (06/15 2000) Resp:  [12-23] 20 (06/16 0145) BP: (108-159)/(64-99) 118/66 (06/16 0100) SpO2:  [83 %-99 %] 98 % (06/16 0145) Weight:  [123.5 kg] 123.5 kg (06/16 0707)  Hemodynamic parameters for last 24 hours:    Intake/Output from previous day: 06/15 0701 - 06/16 0700 In: 319.9 [P.O.:120; I.V.:199.9] Out: 2205 [Urine:2205] Intake/Output this shift: No intake/output data recorded.  General appearance: alert, cooperative, and no distress Neurologic: intact Heart: regular rate and rhythm Lungs: diminished breath sounds bibasilar Abdomen: normal findings: soft, non-tender and + BS Wound: clean and intact  Lab Results: Recent Labs    01/11/24 0404 01/12/24 0426  WBC 16.1* 11.0*  HGB 10.9* 9.8*  HCT 33.3* 29.3*  PLT 167 166   BMET:  Recent Labs    01/11/24 0404 01/12/24 0426  NA 136 134*  K 4.0 3.5  CL 102 101  CO2 25 26  GLUCOSE 142* 139*  BUN 13 11  CREATININE 1.13 0.92  CALCIUM 8.0* 7.6*    PT/INR:  Recent Labs    01/09/24 1322  LABPROT 16.5*  INR 1.3*   ABG    Component Value Date/Time   PHART 7.305 (L) 01/09/2024 1816   HCO3 21.5 01/09/2024 1816   TCO2 23 01/09/2024 1816   ACIDBASEDEF 5.0 (H) 01/09/2024 1816   O2SAT 94 01/09/2024 1816   CBG (last 3)  Recent Labs    01/11/24 1148 01/11/24 1613 01/12/24 0619  GLUCAP 133* 148* 101*    Assessment/Plan: S/P Procedure(s) (LRB): CORONARY ARTERY BYPASS GRAFTING (CABG) TIMES THREE UTILIZING LEFT INTERNAL MAMMARY ARTERY, ENDOSCOPIC  VEIN HARVEST RIGHT GREATER SAPHENOUS VEIN (N/A) ECHOCARDIOGRAM, TRANSESOPHAGEAL (N/A) POD # 3 NEURO- intact CV- in SR, intermittent A fib overnight  Transition to PO amiodarone  Increase metoprolol  to 75 mg daily  ASA  Statin RESP- continue IS, pulmonary hygiene RENAL- Creatinine normal  Supplement K  PO Lasix  ENDO- CBG mildly elevated GI- nausea improved  Diet as tolerated SCD + enoxaparin Cardiac rehab   LOS: 6 days    Samuel Chambers 01/12/2024

## 2024-01-13 ENCOUNTER — Inpatient Hospital Stay (HOSPITAL_COMMUNITY)

## 2024-01-13 LAB — CBC
HCT: 33.8 % — ABNORMAL LOW (ref 39.0–52.0)
Hemoglobin: 11.1 g/dL — ABNORMAL LOW (ref 13.0–17.0)
MCH: 29.4 pg (ref 26.0–34.0)
MCHC: 32.8 g/dL (ref 30.0–36.0)
MCV: 89.4 fL (ref 80.0–100.0)
Platelets: 254 10*3/uL (ref 150–400)
RBC: 3.78 MIL/uL — ABNORMAL LOW (ref 4.22–5.81)
RDW: 14.4 % (ref 11.5–15.5)
WBC: 10.6 10*3/uL — ABNORMAL HIGH (ref 4.0–10.5)
nRBC: 0.2 % (ref 0.0–0.2)

## 2024-01-13 LAB — BASIC METABOLIC PANEL WITH GFR
Anion gap: 10 (ref 5–15)
BUN: 14 mg/dL (ref 8–23)
CO2: 27 mmol/L (ref 22–32)
Calcium: 8.8 mg/dL — ABNORMAL LOW (ref 8.9–10.3)
Chloride: 103 mmol/L (ref 98–111)
Creatinine, Ser: 0.85 mg/dL (ref 0.61–1.24)
GFR, Estimated: >60 mL/min (ref >60)
Glucose, Bld: 117 mg/dL — ABNORMAL HIGH (ref 70–99)
Potassium: 4 mmol/L (ref 3.5–5.1)
Sodium: 140 mmol/L (ref 135–145)

## 2024-01-13 LAB — GLUCOSE, CAPILLARY
Glucose-Capillary: 100 mg/dL — ABNORMAL HIGH (ref 70–99)
Glucose-Capillary: 115 mg/dL — ABNORMAL HIGH (ref 70–99)
Glucose-Capillary: 127 mg/dL — ABNORMAL HIGH (ref 70–99)

## 2024-01-13 MED ORDER — GUAIFENESIN ER 600 MG PO TB12
600.0000 mg | ORAL_TABLET | Freq: Two times a day (BID) | ORAL | Status: DC
  Administered 2024-01-13 – 2024-01-14 (×3): 600 mg via ORAL
  Filled 2024-01-13 (×3): qty 1

## 2024-01-13 MED ORDER — MELATONIN 3 MG PO TABS
3.0000 mg | ORAL_TABLET | Freq: Every day | ORAL | Status: DC
  Administered 2024-01-13: 3 mg via ORAL
  Filled 2024-01-13: qty 1

## 2024-01-13 MED ORDER — LOSARTAN POTASSIUM 25 MG PO TABS
25.0000 mg | ORAL_TABLET | Freq: Every day | ORAL | Status: DC
  Administered 2024-01-13 – 2024-01-14 (×2): 25 mg via ORAL
  Filled 2024-01-13 (×2): qty 1

## 2024-01-13 MED FILL — Electrolyte-R (PH 7.4) Solution: INTRAVENOUS | Qty: 3000 | Status: AC

## 2024-01-13 MED FILL — Sodium Chloride IV Soln 0.9%: INTRAVENOUS | Qty: 1000 | Status: AC

## 2024-01-13 MED FILL — Lidocaine HCl Local Soln Prefilled Syringe 100 MG/5ML (2%): INTRAMUSCULAR | Qty: 5 | Status: AC

## 2024-01-13 MED FILL — Heparin Sodium (Porcine) Inj 1000 Unit/ML: INTRAMUSCULAR | Qty: 10 | Status: AC

## 2024-01-13 MED FILL — Albumin, Human Inj 5%: INTRAVENOUS | Qty: 250 | Status: AC

## 2024-01-13 MED FILL — Sodium Bicarbonate IV Soln 8.4%: INTRAVENOUS | Qty: 100 | Status: AC

## 2024-01-13 MED FILL — Mannitol IV Soln 20%: INTRAVENOUS | Qty: 500 | Status: AC

## 2024-01-13 MED FILL — Heparin Sodium (Porcine) Inj 1000 Unit/ML: INTRAMUSCULAR | Qty: 30 | Status: AC

## 2024-01-13 NOTE — Progress Notes (Addendum)
 [address]       [place] [address]             [phone]      4 Days Post-Op Procedure(s) (LRB): CORONARY ARTERY BYPASS GRAFTING (CABG) TIMES THREE UTILIZING LEFT INTERNAL MAMMARY ARTERY, ENDOSCOPIC VEIN HARVEST RIGHT GREATER SAPHENOUS VEIN (N/A) ECHOCARDIOGRAM, TRANSESOPHAGEAL (N/A) Subjective: Patient states he does not have any pain except for when he coughs. He had been coughing up a bunch of green sputum.   Objective: Vital signs in last 24 hours: Temp:  [97.7 F (36.5 C)-98.3 F (36.8 C)] 98.2 F (36.8 C) (06/17 0809) Pulse Rate:  [72-80] 75 (06/17 0809) Cardiac Rhythm: Normal sinus rhythm (06/16 2100) Resp:  [15-23] 23 (06/17 0809) BP: (117-151)/(60-80) 148/80 (06/17 0809) SpO2:  [92 %-100 %] 100 % (06/17 0809) Weight:  [123.1 kg] 123.1 kg (06/17 0615)  Hemodynamic parameters for last 24 hours:    Intake/Output from previous day: 06/16 0701 - 06/17 0700 In: 964.7 [P.O.:600; I.V.:237.8; IV Piggyback:126.9] Out: 2250 [Urine:2250] Intake/Output this shift: No intake/output data recorded.  General appearance: alert, cooperative, and no distress Neurologic: intact Heart: regular rate and rhythm, S1, S2 normal, no murmur, click, rub or gallop Lungs: diminished bibasilar breath sounds Abdomen: soft, non-tender; bowel sounds normal; no masses,  no organomegaly Extremities: edema 1+ BLE Wound: Clean and dry without sign of infection  Lab Results: Recent Labs    01/11/24 0404 01/12/24 0426  WBC 16.1* 11.0*  HGB 10.9* 9.8*  HCT 33.3* 29.3*  PLT 167 166   BMET:  Recent Labs    01/11/24 0404 01/12/24 0426  NA 136 134*  K 4.0 3.5  CL 102 101  CO2 25 26  GLUCOSE 142* 139*  BUN 13 11  CREATININE 1.13 0.92  CALCIUM 8.0* 7.6*    PT/INR: No results for input(s): LABPROT, INR in the last 72 hours. ABG    Component Value Date/Time   PHART 7.305 (L) 01/09/2024 1816   HCO3 21.5 01/09/2024 1816   TCO2 23 01/09/2024 1816    ACIDBASEDEF 5.0 (H) 01/09/2024 1816   O2SAT 94 01/09/2024 1816   CBG (last 3)  Recent Labs    01/12/24 1613 01/12/24 2104 01/13/24 0610  GLUCAP 121* 123* 100*    Assessment/Plan: S/P Procedure(s) (LRB): CORONARY ARTERY BYPASS GRAFTING (CABG) TIMES THREE UTILIZING LEFT INTERNAL MAMMARY ARTERY, ENDOSCOPIC VEIN HARVEST RIGHT GREATER SAPHENOUS VEIN (N/A) ECHOCARDIOGRAM, TRANSESOPHAGEAL (N/A)  Neuro: On Toradol, pain controlled. Will add Melatonin for sleep.   CV: Hx of postop Afib. About 3 hours of controlled rate afib last night. On PO Amiodarone 400mg  BID. HTN, SBP mostly 140s-150s on Toprol  XL 75mg  daily. Will restart low dose Cozaar  25mg  daily, was on Cozaar  100mg  daily at home.   Pulm: Saturating well on RA. Coughing up green sputum. CXR with bibasilar atelectasis worse on the left. Will add mucinex . Encourage IS and ambulation.   GI: Nausea improved this AM. Tolerating a diet. +BM yesterday.  Endo: Likely prediabetes, A1C 5.9. Not on home meds. CBGs controlled on SSI.   Renal: Cr 0.92, stable. UO 2250cc/24hrs. On Lasix  40mg  daily. About +9lbs from preop weight.   ID: Likely reactive leukocytosis, afebrile. Clinically monitor.   Expected postop ABLA: H/H 9.8/29.3, trending down. No signs of bleeding. Will clinically monitor. Not clinically significant at this time.   DVT Prophylaxis: Lovenox  Dispo: Hopefully can d/c home next 24-48 hours   LOS: 7 days    Angela Barban,  PA-C 01/13/2024 Patient seen and examined, looks good Plan as outlined above Likely home tomorrow if continues to progress  Landon Pinion C. Luna Salinas, MD [facility] [phone]

## 2024-01-13 NOTE — Progress Notes (Signed)
 CARDIAC REHAB PHASE I   PRE:  Rate/Rhythm: 72 NSR  BP:  Sitting: 149/69      SpO2: 100 RA  MODE:  Ambulation: 180 ft    POST:  Rate/Rhythm: 96 NSR  BP:  Sitting: 149/80      SpO2: 100 RA  Pt ambulated with supervision assistance in the hallway w/o AD support. Pt walked well w/o signs of discomfort, SOB, and pain. Pt returned to room and educated with wife present.   Pt received OHS book and education on restrictions, heart healthy diet, ex guidelines, Move in the Tube sheet, incentive spirometer use when d/c and CRPII. Pt denies questions and was encouraged to look in the book for additional information. Referral placed to [facility].   Pt is interested in CR at [facility].   Barkley Li  MS, ACSM-CEP 11:29 AM 01/13/2024    Service time is from 1100 to 1129.

## 2024-01-13 NOTE — Evaluation (Signed)
 Physical Therapy Evaluation Patient Details Name: Samuel Chambers MRN: [id number] DOB: 10-Dec-1950 Today's Date: 01/13/2024  History of Present Illness  Pt is a 73 y/o male admitted for surgical intervention for CAD, unstable angina, AS.  6/13 s/p CABGx3.  Course complicated by nausea and dry heaves.  PMHx:  bilateral hip and knee replacements, Rt knee Jun 2021,  DM, fibromyalgia, DJD, HTN, hyperlipidemia  Clinical Impression  Pt is close to baseline functioning and should be safe at home PRN assist of his wife. There are no further acute PT needs.  Will sign off at this time.         If plan is discharge home, recommend the following: A little help with bathing/dressing/bathroom   Can travel by private vehicle        Equipment Recommendations None recommended by PT  Recommendations for Other Services       Functional Status Assessment Patient has had a recent decline in their functional status and demonstrates the ability to make significant improvements in function in a reasonable and predictable amount of time.     Precautions / Restrictions Precautions Precautions: Sternal Precaution Booklet Issued: No Recall of Precautions/Restrictions: Intact      Mobility  Bed Mobility               General bed mobility comments: not witnessed.  pt/wife agreed pt completed up OOB from flat bed by throwing legs off and sitting up without UE assist of any kind.    Transfers Overall transfer level: Needs assistance   Transfers: Sit to/from Stand Sit to Stand: Supervision           General transfer comment: pt verbalized no use of UE's to boost from chair or bed.  Understand tube reference/  Stood without assist.    Ambulation/Gait Ambulation/Gait assistance: Supervision Gait Distance (Feet): 500 Feet Assistive device: None Gait Pattern/deviations: Step-through pattern   Gait velocity interpretation: >2.62 ft/sec, indicative of community ambulatory   General Gait  Details: steady, gait speed age appropriate.  Pt able to turn abruptly, step over obstacles, back up and scan.  pt able to change speed significantly.  He did relate fatigue.  Stairs Stairs: Yes Stairs assistance: Supervision Stair Management: One rail Right, Two rails, Alternating pattern, Step to pattern, Forwards Number of Stairs: 6 General stair comments: safe with rails  Wheelchair Mobility     Tilt Bed    Modified Rankin (Stroke Patients Only)       Balance Overall balance assessment: No apparent balance deficits (not formally assessed), Needs assistance   Sitting balance-Leahy Scale: Normal       Standing balance-Leahy Scale: Good                               Pertinent Vitals/Pain Pain Assessment Pain Assessment: Faces (4/10 with cough) Faces Pain Scale: No hurt Pain Intervention(s): Monitored during session    Home Living Family/patient expects to be discharged to:: Private residence Living Arrangements: Spouse/significant other Available Help at Discharge: Family Type of Home: House Home Access: Stairs to enter Entrance Stairs-Rails: Can reach both Entrance Stairs-Number of Steps: 4   Home Layout: One level Home Equipment: Cane - single Librarian, academic (2 wheels);Wheelchair - manual      Prior Function Prior Level of Function : Independent/Modified Independent             Mobility Comments: Tourist information centre manager without AD; drives ADLs Comments: Independent  Extremity/Trunk Assessment   Upper Extremity Assessment Upper Extremity Assessment:  (pt following sternal precautions well)    Lower Extremity Assessment Lower Extremity Assessment: Overall WFL for tasks assessed (mildly weak and deconditioned)    Cervical / Trunk Assessment Cervical / Trunk Assessment: Normal  Communication   Communication Communication: No apparent difficulties    Cognition Arousal: Alert Behavior During Therapy: WFL for tasks  assessed/performed   PT - Cognitive impairments: No apparent impairments                         Following commands: Intact       Cueing Cueing Techniques: Verbal cues     General Comments General comments (skin integrity, edema, etc.): Completed CABG education with pt/wift.    Exercises     Assessment/Plan    PT Assessment Patient does not need any further PT services  PT Problem List         PT Treatment Interventions      PT Goals (Current goals can be found in the Care Plan section)  Acute Rehab PT Goals Patient Stated Goal: home tomorrow and back to fishing etc. PT Goal Formulation: All assessment and education complete, DC therapy    Frequency       Co-evaluation               AM-PAC PT 6 Clicks Mobility  Outcome Measure Help needed turning from your back to your side while in a flat bed without using bedrails?: None Help needed moving from lying on your back to sitting on the side of a flat bed without using bedrails?: None Help needed moving to and from a bed to a chair (including a wheelchair)?: A Little Help needed standing up from a chair using your arms (e.g., wheelchair or bedside chair)?: None Help needed to walk in hospital room?: None Help needed climbing 3-5 steps with a railing? : A Little 6 Click Score: 22    End of Session   Activity Tolerance: Patient tolerated treatment well Patient left: in chair;with call bell/phone within reach;with family/visitor present Nurse Communication: Mobility status PT Visit Diagnosis: Other abnormalities of gait and mobility (R26.89);Pain Pain - part of body:  (sternal with cough)    Time: 1610-9604 PT Time Calculation (min) (ACUTE ONLY): 35 min   Charges:   PT Evaluation $PT Eval Moderate Complexity: 1 Mod PT Treatments $Gait Training: 8-22 mins PT General Charges $$ ACUTE PT VISIT: 1 Visit         01/13/2024  Nohemi Batters., PT Acute Rehabilitation Services [phone]   (office)  Durell Gilding Deadrian Toya 01/13/2024, 4:34 PM

## 2024-01-13 NOTE — Care Management Important Message (Signed)
 Important Message  Patient Details  Name: Samuel Chambers MRN: [id number] Date of Birth: 12/25/1950   Important Message Given:  Yes - Medicare IM     [name] 01/13/2024, 10:44 AM

## 2024-01-14 ENCOUNTER — Other Ambulatory Visit (HOSPITAL_COMMUNITY): Payer: Self-pay

## 2024-01-14 LAB — BASIC METABOLIC PANEL WITH GFR
Anion gap: 10 (ref 5–15)
BUN: 16 mg/dL (ref 8–23)
CO2: 24 mmol/L (ref 22–32)
Calcium: 8.3 mg/dL — ABNORMAL LOW (ref 8.9–10.3)
Chloride: 104 mmol/L (ref 98–111)
Creatinine, Ser: 0.91 mg/dL (ref 0.61–1.24)
GFR, Estimated: >60 mL/min (ref >60)
Glucose, Bld: 121 mg/dL — ABNORMAL HIGH (ref 70–99)
Potassium: 3.5 mmol/L (ref 3.5–5.1)
Sodium: 138 mmol/L (ref 135–145)

## 2024-01-14 LAB — CBC
HCT: 28.4 % — ABNORMAL LOW (ref 39.0–52.0)
Hemoglobin: 9.5 g/dL — ABNORMAL LOW (ref 13.0–17.0)
MCH: 29.6 pg (ref 26.0–34.0)
MCHC: 33.5 g/dL (ref 30.0–36.0)
MCV: 88.5 fL (ref 80.0–100.0)
Platelets: 211 10*3/uL (ref 150–400)
RBC: 3.21 MIL/uL — ABNORMAL LOW (ref 4.22–5.81)
RDW: 14 % (ref 11.5–15.5)
WBC: 8.7 10*3/uL (ref 4.0–10.5)
nRBC: 0.2 % (ref 0.0–0.2)

## 2024-01-14 LAB — GLUCOSE, CAPILLARY: Glucose-Capillary: 104 mg/dL — ABNORMAL HIGH (ref 70–99)

## 2024-01-14 MED ORDER — AMIODARONE HCL 200 MG PO TABS
400.0000 mg | ORAL_TABLET | Freq: Two times a day (BID) | ORAL | 1 refills | Status: DC
  Filled 2024-01-14: qty 60, 31d supply, fill #0

## 2024-01-14 MED ORDER — ASPIRIN 325 MG PO TBEC: 325.0000 mg | DELAYED_RELEASE_TABLET | Freq: Every day | ORAL | Status: DC

## 2024-01-14 MED ORDER — OXYCODONE HCL 5 MG PO TABS
5.0000 mg | ORAL_TABLET | Freq: Four times a day (QID) | ORAL | 0 refills | Status: DC | PRN
  Filled 2024-01-14: qty 20, 5d supply, fill #0

## 2024-01-14 MED ORDER — GUAIFENESIN ER 600 MG PO TB12: 600.0000 mg | ORAL_TABLET | Freq: Two times a day (BID) | ORAL | Status: DC

## 2024-01-14 NOTE — Plan of Care (Signed)
  Problem: Education: Goal: Understanding of CV disease, CV risk reduction, and recovery process will improve Outcome: Adequate for Discharge Goal: Individualized Educational Video(s) Outcome: Adequate for Discharge   Problem: Activity: Goal: Ability to return to baseline activity level will improve Outcome: Adequate for Discharge   Problem: Cardiovascular: Goal: Ability to achieve and maintain adequate cardiovascular perfusion will improve Outcome: Adequate for Discharge Goal: Vascular access site(s) Level 0-1 will be maintained Outcome: Adequate for Discharge   Problem: Health Behavior/Discharge Planning: Goal: Ability to safely manage health-related needs after discharge will improve Outcome: Adequate for Discharge   Problem: Education: Goal: Knowledge of General Education information will improve Description: Including pain rating scale, medication(s)/side effects and non-pharmacologic comfort measures Outcome: Adequate for Discharge   Problem: Health Behavior/Discharge Planning: Goal: Ability to manage health-related needs will improve Outcome: Adequate for Discharge   Problem: Clinical Measurements: Goal: Ability to maintain clinical measurements within normal limits will improve Outcome: Adequate for Discharge Goal: Will remain free from infection Outcome: Adequate for Discharge Goal: Diagnostic test results will improve Outcome: Adequate for Discharge Goal: Respiratory complications will improve Outcome: Adequate for Discharge Goal: Cardiovascular complication will be avoided Outcome: Adequate for Discharge   Problem: Activity: Goal: Risk for activity intolerance will decrease Outcome: Adequate for Discharge   Problem: Nutrition: Goal: Adequate nutrition will be maintained Outcome: Adequate for Discharge   Problem: Coping: Goal: Level of anxiety will decrease Outcome: Adequate for Discharge   Problem: Elimination: Goal: Will not experience complications  related to bowel motility Outcome: Adequate for Discharge Goal: Will not experience complications related to urinary retention Outcome: Adequate for Discharge   Problem: Pain Managment: Goal: General experience of comfort will improve and/or be controlled Outcome: Adequate for Discharge   Problem: Safety: Goal: Ability to remain free from injury will improve Outcome: Adequate for Discharge   Problem: Skin Integrity: Goal: Risk for impaired skin integrity will decrease Outcome: Adequate for Discharge   Problem: Education: Goal: Will demonstrate proper wound care and an understanding of methods to prevent future damage Outcome: Adequate for Discharge Goal: Knowledge of disease or condition will improve Outcome: Adequate for Discharge Goal: Knowledge of the prescribed therapeutic regimen will improve Outcome: Adequate for Discharge Goal: Individualized Educational Video(s) Outcome: Adequate for Discharge   Problem: Activity: Goal: Risk for activity intolerance will decrease Outcome: Adequate for Discharge   Problem: Cardiac: Goal: Will achieve and/or maintain hemodynamic stability Outcome: Adequate for Discharge   Problem: Clinical Measurements: Goal: Postoperative complications will be avoided or minimized Outcome: Adequate for Discharge   Problem: Respiratory: Goal: Respiratory status will improve Outcome: Adequate for Discharge   Problem: Skin Integrity: Goal: Wound healing without signs and symptoms of infection Outcome: Adequate for Discharge Goal: Risk for impaired skin integrity will decrease Outcome: Adequate for Discharge   Problem: Urinary Elimination: Goal: Ability to achieve and maintain adequate renal perfusion and functioning will improve Outcome: Adequate for Discharge

## 2024-01-14 NOTE — Evaluation (Signed)
 Occupational Therapy Evaluation Patient Details Name: Samuel Chambers MRN: [id number] DOB: March 09, 1951 Today's Date: 01/14/2024   History of Present Illness   Pt is a 73 y/o male admitted for surgical intervention for CAD, unstable angina, AS.  6/13 s/p CABGx3.  Course complicated by nausea and dry heaves.  PMHx:  bilateral hip and knee replacements, Rt knee Jun 2021,  DM, fibromyalgia, DJD, HTN, hyperlipidemia     Clinical Impressions Pt admitted based on above, and was seen based on problem list below. PTA pt was living with his wife and independent with ADLs and IADLs including driving. Today pt is requiring supervision to min assist for  ADLs. Functional transfers are  s for safety. Pt able to recall all sternal precautions without verbal cues and maintain them functionally. Educated on use of compensatory strategies to assist in ADLs. Wife present for educational session and able to assist at d/c. No follow up OT or DME needs. No further acute OT needs, OT is signing off on this pt.     If plan is discharge home, recommend the following:   A little help with walking and/or transfers;A little help with bathing/dressing/bathroom     Functional Status Assessment   Patient has had a recent decline in their functional status and demonstrates the ability to make significant improvements in function in a reasonable and predictable amount of time.     Equipment Recommendations   None recommended by OT     Recommendations for Other Services         Precautions/Restrictions   Precautions Precautions: Sternal Recall of Precautions/Restrictions: Intact Restrictions Weight Bearing Restrictions Per Provider Order: No     Mobility Bed Mobility               General bed mobility comments: Pt received in recliner    Transfers Overall transfer level: Needs assistance Equipment used: None Transfers: Sit to/from Stand, Bed to chair/wheelchair/BSC Sit to Stand:  Supervision     Step pivot transfers: Supervision     General transfer comment: S for safety      Balance Overall balance assessment: Mild deficits observed, not formally tested       ADL either performed or assessed with clinical judgement   ADL Overall ADL's : Needs assistance/impaired Eating/Feeding: Set up;Sitting   Grooming: Supervision/safety;Standing           Upper Body Dressing : Sitting;Minimal assistance Upper Body Dressing Details (indicate cue type and reason): Min assist to pull down in back Lower Body Dressing: Sit to/from stand;Minimal assistance Lower Body Dressing Details (indicate cue type and reason): min assist for pulling above bottom Toilet Transfer: Supervision/safety;Ambulation Toilet Transfer Details (indicate cue type and reason): No AD, simulated in room Toileting- Clothing Manipulation and Hygiene: Supervision/safety;Sit to/from stand   Tub/ Shower Transfer: Supervision/safety;Ambulation     General ADL Comments: S for safety for maintaining sternal precautions     Vision Baseline Vision/History: 0 No visual deficits Vision Assessment?: No apparent visual deficits            Pertinent Vitals/Pain Pain Assessment Pain Assessment: No/denies pain Pain Intervention(s): Monitored during session     Extremity/Trunk Assessment Upper Extremity Assessment Upper Extremity Assessment: Overall WFL for tasks assessed   Lower Extremity Assessment Lower Extremity Assessment: Defer to PT evaluation   Cervical / Trunk Assessment Cervical / Trunk Assessment: Normal   Communication Communication Communication: No apparent difficulties   Cognition Arousal: Alert Behavior During Therapy: WFL for tasks assessed/performed Cognition: No apparent  impairments   Following commands: Intact       Cueing  General Comments   Cueing Techniques: Verbal cues  Wife present for session and supported VSS on RA           Home Living  Family/patient expects to be discharged to:: Private residence Living Arrangements: Spouse/significant other Available Help at Discharge: Family;Available 24 hours/day Type of Home: House Home Access: Stairs to enter [employer] of Steps: 4 Entrance Stairs-Rails: Can reach both Home Layout: One level     Bathroom Shower/Tub: Chief Strategy Officer: Handicapped height Bathroom Accessibility: Yes How Accessible: Accessible via walker;Accessible via wheelchair Home Equipment: Cane - single Librarian, academic (2 wheels);Wheelchair - manual;Grab bars - tub/shower          Prior Functioning/Environment Prior Level of Function : Independent/Modified Independent     Mobility Comments: Tourist information centre manager without AD; drives ADLs Comments: Independent    OT Problem List: Decreased activity tolerance;Cardiopulmonary status limiting activity   OT Treatment/Interventions:        OT Goals(Current goals can be found in the care plan section)   Acute Rehab OT Goals Patient Stated Goal: To go home OT Goal Formulation: With patient Time For Goal Achievement: 01/28/24 Potential to Achieve Goals: Good   OT Frequency:       Co-evaluation              AM-PAC OT 6 Clicks Daily Activity     Outcome Measure Help from another person eating meals?: None Help from another person taking care of personal grooming?: A Little Help from another person toileting, which includes using toliet, bedpan, or urinal?: A Little Help from another person bathing (including washing, rinsing, drying)?: A Little Help from another person to put on and taking off regular upper body clothing?: A Little Help from another person to put on and taking off regular lower body clothing?: A Little 6 Click Score: 19   End of Session Nurse Communication: Mobility status  Activity Tolerance: Patient tolerated treatment well Patient left: in chair;with call bell/phone within reach;with  family/visitor present  OT Visit Diagnosis: Muscle weakness (generalized) (M62.81)                Time: 2130-8657 OT Time Calculation (min): 31 min Charges:  OT General Charges $OT Visit: 1 Visit OT Evaluation $OT Eval Moderate Complexity: 1 Mod OT Treatments $Self Care/Home Management : 8-22 mins  Delmer Ferraris, OT  Acute Rehabilitation Services Office [phone] Secure chat preferred   Mickael Alamo 01/14/2024, 8:24 AM

## 2024-01-14 NOTE — Progress Notes (Signed)
 Discharge instructions (including medications) discussed with and copy provided to patient. Patient and family given the opportunity to ask questions. Questions clarified.

## 2024-01-14 NOTE — TOC Transition Note (Signed)
 Transition of Care (TOC) - Discharge Note Sherin Dingwall RN, BSN Transitions of Care Unit 4E- RN Case Manager See Treatment Team for direct phone #   Patient Details  Name: Samuel Chambers MRN: [id number] Date of Birth: 20-Oct-1950  Transition of Care [facility]) CM/SW Contact:  Rox Cope, RN Phone Number: 01/14/2024, 9:45 AM   Clinical Narrative:    Pt stable for transition home today, no HH or DME needs noted. Spouse to transport home.    Final next level of care: Home/Self Care Barriers to Discharge: Barriers Resolved   Patient Goals and CMS Choice Patient states their goals for this hospitalization and ongoing recovery are:: return home   Choice offered to / list presented to : NA      Discharge Placement               Home        Discharge Plan and Services Additional resources added to the After Visit Summary for     Discharge Planning Services: CM Consult Post Acute Care Choice: Home Health          DME Arranged: N/A DME Agency: NA       HH Arranged: NA HH Agency: NA        Social Drivers of Health (SDOH) Interventions SDOH Screenings   Food Insecurity: No Food Insecurity (01/06/2024)  Housing: Low Risk  (01/06/2024)  Transportation Needs: No Transportation Needs (01/06/2024)  Utilities: Not At Risk (01/06/2024)  Alcohol Screen: Low Risk  (11/18/2022)  Depression (PHQ2-9): Low Risk  (11/19/2023)  Financial Resource Strain: Low Risk  (11/15/2023)  Physical Activity: Unknown (11/15/2023)  Social Connections: Socially Integrated (01/06/2024)  Stress: No Stress Concern Present (11/15/2023)  Tobacco Use: Low Risk  (01/09/2024)  Health Literacy: Adequate Health Literacy (11/19/2023)     Readmission Risk Interventions    01/14/2024    9:44 AM  Readmission Risk Prevention Plan  Post Dischage Appt Complete  Medication Screening Complete  Transportation Screening Complete

## 2024-01-14 NOTE — Plan of Care (Signed)

## 2024-01-14 NOTE — Progress Notes (Addendum)
      [address]       [place] [address]             [phone]      5 Days Post-Op Procedure(s) (LRB): CORONARY ARTERY BYPASS GRAFTING (CABG) TIMES THREE UTILIZING LEFT INTERNAL MAMMARY ARTERY, ENDOSCOPIC VEIN HARVEST RIGHT GREATER SAPHENOUS VEIN (N/A) ECHOCARDIOGRAM, TRANSESOPHAGEAL (N/A) Subjective: Sitting up in the chair, eager to go home today.  No concerns, walking independently. Tolerating PO's. Maintaining O2 sats on RA.   Objective: Vital signs in last 24 hours: Temp:  [97.6 F (36.4 C)-98.7 F (37.1 C)] 97.8 F (36.6 C) (06/18 0718) Pulse Rate:  [64-75] 64 (06/18 0718) Cardiac Rhythm: Normal sinus rhythm (06/17 1908) Resp:  [18-23] 18 (06/18 0718) BP: (119-161)/(63-80) 134/67 (06/18 0718) SpO2:  [97 %-100 %] 99 % (06/18 0718) Weight:  [123.4 kg] 123.4 kg (06/18 0617)      Intake/Output from previous day: 06/17 0701 - 06/18 0700 In: -  Out: 1100 [Urine:1100] Intake/Output this shift: No intake/output data recorded.  General appearance: alert, cooperative, and no distress Neurologic: intact Heart: regular rate and rhythm, no a-fib past 24 hours. Lungs: clear breath sounds. Mild left basilar ATX on CXR yesterday.  Abdomen: soft, non-tender Extremities: edema 1+ BLE Wound: Clean and dry without sign of infection  Lab Results: Recent Labs    01/13/24 0906 01/14/24 0301  WBC 10.6* 8.7  HGB 11.1* 9.5*  HCT 33.8* 28.4*  PLT 254 211   BMET:  Recent Labs    01/13/24 0906 01/14/24 0301  NA 140 138  K 4.0 3.5  CL 103 104  CO2 27 24  GLUCOSE 117* 121*  BUN 14 16  CREATININE 0.85 0.91  CALCIUM 8.8* 8.3*    PT/INR: No results for input(s): LABPROT, INR in the last 72 hours. ABG    Component Value Date/Time   PHART 7.305 (L) 01/09/2024 1816   HCO3 21.5 01/09/2024 1816   TCO2 23 01/09/2024 1816   ACIDBASEDEF 5.0 (H) 01/09/2024 1816   O2SAT 94 01/09/2024 1816   CBG (last 3)  Recent Labs    01/13/24 1150  01/13/24 1615 01/14/24 0616  GLUCAP 115* 127* 104*    Assessment/Plan: S/P Procedure(s) (LRB): CORONARY ARTERY BYPASS GRAFTING (CABG) TIMES THREE UTILIZING LEFT INTERNAL MAMMARY ARTERY, ENDOSCOPIC VEIN HARVEST RIGHT GREATER SAPHENOUS VEIN (N/A) ECHOCARDIOGRAM, TRANSESOPHAGEAL (N/A)  Neuro:  pain controlled with acetaminophen .   CV: Maintaining SR on oral amiodarone after Afib early post-op. Continue PO Amiodarone 400mg  BID for 5 days then taper. HTN, MAP in the high 80's on Toprol  XL 75mg  daily and Cozaar  25mg  daily, was on Cozaar  100mg  daily at home. Will increase at discharge.   Pulm: Saturating well on RA.  Encourage IS and ambulation.   GI: Nausea resolved. Tolerating a diet and having appropriate bowel function.  Endo: Likely prediabetes, A1C 5.9. Not on home meds. CBGs controlled on SSI.   HEME- expected acute blood loss anemia, tolerating well. Leukocytosis resolved.   Renal: Cr 0.92, stable. Wt stll about 4kg+, will resume his hydrochlorothiazide  at discharge.   DVT Prophylaxis: Lovenox  Dispo: discharge to home today. Instructions given and follow up arranged.    LOS: 8 days    Samuel Persons G. Alexandrina Fiorini, PA-C 01/14/2024

## 2024-01-15 ENCOUNTER — Telehealth: Payer: Self-pay | Admitting: *Deleted

## 2024-01-15 LAB — GLUCOSE, CAPILLARY
Glucose-Capillary: 132 mg/dL — ABNORMAL HIGH (ref 70–99)
Glucose-Capillary: 130 mg/dL — ABNORMAL HIGH (ref 70–99)

## 2024-01-15 NOTE — Transitions of Care (Post Inpatient/ED Visit) (Signed)
 01/15/2024  Name: Samuel Chambers MRN: [id number] DOB: [birth year]  Today's TOC FU Call Status: Today's TOC FU Call Status:: Successful TOC FU Call Completed TOC FU Call Complete Date: 01/15/24 Patient's Name and Date of Birth confirmed.  Transition Care Management Follow-up Telephone Call Date of Discharge: 01/14/24 Discharge Facility: [facility]) Type of Discharge: Inpatient Admission Primary Inpatient Discharge Diagnosis:: scheduled cardiac catherization with post-cath surgical CABG x 3 How have you been since you were released from the hospital?: Better (I am doing fine, I have my wife here helping me with whatever I need, but I am able to do everything I need to pretty much on my own.  Pain is under control, I haven't even needed to take any pain medicine; walking around fine and using the IS) Any questions or concerns?: No  Items Reviewed: Did you receive and understand the discharge instructions provided?: Yes (thoroughly reviewed with patient who verbalizes good understanding of same) Medications obtained,verified, and reconciled?: Yes (Medications Reviewed) (Full medication reconciliation/ review completed; no concerns or discrepancies identified; confirmed patient obtained/ is taking all newly Rx'd medications as instructed; self-manages medications and denies questions/ concerns around medications today) Any new allergies since your discharge?: No Dietary orders reviewed?: Yes Type of Diet Ordered:: Trying to follow low salt heart healthy diet Do you have support at home?: Yes People in Home [RPT]: spouse Name of Support/Comfort Primary Source: Reports independent in self-care activities; supportive spouse assists as/ if needed/ indicated  Medications Reviewed Today: Medications Reviewed Today     Reviewed by Cimone Fahey M, RN (Registered Nurse) on 01/15/24 at 1253  Med List Status: <None>   Medication Order Taking? Sig Documenting Provider Last Dose Status  Informant  acetaminophen  (TYLENOL ) 500 MG tablet [id number] Yes Take 1,000 mg by mouth every 6 (six) hours as needed for moderate pain (pain score 4-6). [provider]  Active Self  amiodarone (PACERONE) 200 MG tablet [id number] Yes Take 2 tablets (400 mg total) by mouth 2 (two) times daily for 5 days then decrease the dose to 1 tablet (200mg ) twice daily for 2 weeks,  then decrease the dose to 1 tablet (200mg ) once daily. Roddenberry, Myron G, PA-C  Active   aspirin  EC 325 MG tablet [id number] Yes Take 1 tablet (325 mg total) by mouth daily. Roddenberry, Myron G, PA-C  Active   DULoxetine  (CYMBALTA ) 60 MG capsule [id number] Yes TAKE 1 CAPSULE BY MOUTH EVERY DAY Sagardia, Isidro Margo, MD  Active Self  guaiFENesin  (MUCINEX ) 600 MG 12 hr tablet [id number] Yes Take 1 tablet (600 mg total) by mouth 2 (two) times daily. Roddenberry, Myron G, PA-C  Active   hydrochlorothiazide  (HYDRODIURIL ) 25 MG tablet [id number] Yes Take 1 tablet (25 mg total) by mouth daily. Sagardia, Miguel Jose, MD  Active Self  losartan  (COZAAR ) 100 MG tablet [id number] Yes Take 1 tablet (100 mg total) by mouth daily. Sagardia, Miguel Jose, MD  Active Self  metoprolol  succinate (TOPROL -XL) 50 MG 24 hr tablet [id number] Yes Take 1 tablet (50 mg total) by mouth daily. Elvira Hammersmith, MD  Active Self  oxyCODONE  (ROXICODONE ) 5 MG immediate release tablet [id number] Yes Take 1 tablet (5 mg total) by mouth every 6 (six) hours as needed for up to 5 days for severe pain (pain score 7-10). Roddenberry, Myron G, PA-C  Active            Home Care and Equipment/Supplies: Were Home Health Services Ordered?: No Any new equipment or medical supplies ordered?: No  Functional Questionnaire: Do you need assistance with bathing/showering or dressing?: No Do you need assistance with meal preparation?: No Do you need assistance with eating?: No Do you have difficulty maintaining continence: No Do you need assistance with getting out of  bed/getting out of a chair/moving?: No Do you have difficulty managing or taking your medications?: No  Follow up appointments reviewed: PCP Follow-up appointment confirmed?: NA (No PCP Hospital follow up recommended: patient declines scheduling with PCP, states they didn't tell me to see him- and I have an appointment with the surgeon already scheduled) Specialist Hospital Follow-up appointment confirmed?: Yes Date of Specialist follow-up appointment?: 01/21/24 Follow-Up Specialty Provider:: Vascular surgeon Do you need transportation to your follow-up appointment?: No Do you understand care options if your condition(s) worsen?: Yes-patient verbalized understanding  SDOH Interventions Today    Flowsheet Row Most Recent Value  SDOH Interventions   Food Insecurity Interventions Intervention Not Indicated  Housing Interventions Intervention Not Indicated  Transportation Interventions Intervention Not Indicated  [drives self at baseline,  spouse assisting post- recent surgery]  Utilities Interventions Intervention Not Indicated   Patient declines need for ongoing/ further care management outreach; declines enrollment in 30-day TOC program; provided my direct contact information should questions/ concerns/ needs arise post-TOC call   See TOC assessment tabs for additional assessment/ TOC intervention information  Pls call/ message for questions,  Sophira Rumler Mckinney Satara Virella, RN, BSN, Media planner  Transitions of Care  [facility]  [place] [phone]: direct office

## 2024-01-19 ENCOUNTER — Encounter: Payer: Self-pay | Admitting: *Deleted

## 2024-01-19 ENCOUNTER — Telehealth: Payer: Self-pay

## 2024-01-19 NOTE — Transitions of Care (Post Inpatient/ED Visit) (Signed)
 01/19/2024  Patient ID: Lynwood Ubaldo Blush, male   DOB: [birth year], 73 y.o.   MRN: [id number]  Received off hour voice mail message, requesting call back- returned call to patient successfully  HIPAA identifiers x 2 verified  Patient reports he and his spouse left message as they were wondering if it would be okay to use compression stockings post- recent CABG: however, he tells me now that he has changed his mind and has decided not to wear them after all  Advised patient to discuss this with his surgical team when he attends 01/21/24 surgical provider office visit; confirmed he has small incision on lower extremity; he reports incision looks fine, and I am still doing great, not having any problems at all; he denies additional concerns and again declines TOC 30-day program enrollment  Pls call/ message for questions,  Beatris Blinda Lawrence, RN, BSN, CCRN Alumnus RN Care Manager  Transitions of Care  [facility] [phone]: direct office

## 2024-01-19 NOTE — Telephone Encounter (Signed)
-----   Message from Katlyn D [place] sent at 01/19/2024 12:05 PM EDT ----- Please let Samuel Chambers know that he should keep follow up with general cardiology as we will be managing his CAD after surgery team signs off.   Thank you,  Katlyn West, NP ----- Message ----- From: Janet Olam NOVAK Sent: 01/19/2024   9:59 AM EDT To: Katlyn D West, NP  Good Morning!   I just spoke with patient this am and we cancelled echo per patient had in the hospital and already had CABG. Patient wants to know if he needs the follow up with you in July since seeing the surgeon and already having CABG. Please reach out to patient for future instructions.

## 2024-01-19 NOTE — Telephone Encounter (Signed)
 Called patient advised of below Told patient the the echo appointment for that day was canceled, and that he will still need appointment due to reasons below

## 2024-01-20 ENCOUNTER — Ambulatory Visit: Payer: Self-pay

## 2024-01-20 NOTE — Telephone Encounter (Signed)
 Called [facility] to inform them of patient's condition and request for medication

## 2024-01-20 NOTE — Telephone Encounter (Signed)
 FYI Only or Action Required?: Action required by provider: medication refill request.  Patient was last seen in primary care on 07/08/2023 by Purcell Emil Schanz, MD. Called Nurse Triage reporting Cough. Symptoms began 01/09/2024 after open heart surgery per patient. Interventions attempted: OTC medications: cough syrup and Prescription medications: Benzonatate . Symptoms are: gradually worsening.  Triage Disposition: See Physician Within 24 Hours  Patient/caregiver understands and will follow disposition?: No, wishes to speak with PCP             Open Heart surgery 01/09/2024       Copied from CRM #[id number]. Topic: Clinical - Red Word Triage >> Jan 20, 2024  8:55 AM [place] A wrote: [facility] that prompted transfer to Nurse Triage: Patient had open heart surgery about week or two ago-has constant cough that hurts when he coughs Reason for Disposition  [1] Continuous (nonstop) coughing interferes with work or school AND [2] no improvement using cough treatment per Care Advice  Answer Assessment - Initial Assessment Questions 1. ONSET: When did the cough begin?      After open heart surgery after surgery 2. SEVERITY: How bad is the cough today?      ----- 3. SPUTUM: Describe the color of your sputum (none, dry cough; clear, white, yellow, green)     N/A 4. HEMOPTYSIS: Are you coughing up any blood? If so ask: How much? (flecks, streaks, tablespoons, etc.)     No 5. DIFFICULTY BREATHING: Are you having difficulty breathing? If Yes, ask: How bad is it? (e.g., mild, moderate, severe)    - MILD: No SOB at rest, mild SOB with walking, speaks normally in sentences, can lie down, no retractions, pulse < 100.    - MODERATE: SOB at rest, SOB with minimal exertion and prefers to sit, cannot lie down flat, speaks in phrases, mild retractions, audible wheezing, pulse 100-120.    - SEVERE: Very SOB at rest, speaks in single words, struggling to breathe, sitting hunched  forward, retractions, pulse > 120      No 6. FEVER: Do you have a fever? If Yes, ask: What is your temperature, how was it measured, and when did it start?     No 7. CARDIAC HISTORY: Do you have any history of heart disease? (e.g., heart attack, congestive heart failure)      Open heart surgery 01/09/2024 8. LUNG HISTORY: Do you have any history of lung disease?  (e.g., pulmonary embolus, asthma, emphysema)     No 9. PE RISK FACTORS: Do you have a history of blood clots? (or: recent major surgery, recent prolonged travel, bedridden)     No 10. OTHER SYMPTOMS: Do you have any other symptoms? (e.g., runny nose, wheezing, chest pain)       cough    Open heart surgery 01/09/2024    hard to lay down to sleep especially at night  Answer Assessment - Initial Assessment Questions 1. ONSET: When did the cough begin?      01/09/2024 after open heart surgery 2. SEVERITY: How bad is the cough today?      Nagging 3. SPUTUM: Describe the color of your sputum (none, dry cough; clear, white, yellow, green)     Clear if any 4. HEMOPTYSIS: Are you coughing up any blood? If so ask: How much? (flecks, streaks, tablespoons, etc.)     No 5. DIFFICULTY BREATHING: Are you having difficulty breathing? If Yes, ask: How bad is it? (e.g., mild, moderate, severe)    - MILD: No SOB  at rest, mild SOB with walking, speaks normally in sentences, can lie down, no retractions, pulse < 100.    - MODERATE: SOB at rest, SOB with minimal exertion and prefers to sit, cannot lie down flat, speaks in phrases, mild retractions, audible wheezing, pulse 100-120.    - SEVERE: Very SOB at rest, speaks in single words, struggling to breathe, sitting hunched forward, retractions, pulse > 120      No 6. FEVER: Do you have a fever? If Yes, ask: What is your temperature, how was it measured, and when did it start?     No 7. CARDIAC HISTORY: Do you have any history of heart disease? (e.g., heart attack,  congestive heart failure)      Open heart surgery 01/09/2024 8. LUNG HISTORY: Do you have any history of lung disease?  (e.g., pulmonary embolus, asthma, emphysema)     No 9. PE RISK FACTORS: Do you have a history of blood clots? (or: recent major surgery, recent prolonged travel, bedridden)     Np 10. OTHER SYMPTOMS: Do you have any other symptoms? (e.g., runny nose, wheezing, chest pain)       Patient denies     Patient was advised that he would have a cough after his surgery and has been doing breathing exercises as instructed. He states that his PCP prescribes Benzonatate  in the past for a persistent cough and his last prescription was in December. He states that this medication is the only thing that helps his cough and he would like a prescription refill if possible so he can get some rest. He denies any other symptoms and states he is going tomorrow to have his stitches taken out. Patient is offered an appointment at his PCP office this morning but states he cannot drive.  He is then offered a video visit and declined this as well.  Patient just wanted to see about getting the medication refilled without being seen. Patient was advised that if anything gets worse to go to the Emergency Room. He verbalized understanding.  Protocols used: Cough - Acute Productive-A-AH, Cough - Acute Non-Productive-A-AH

## 2024-01-21 ENCOUNTER — Ambulatory Visit: Payer: Self-pay | Attending: Thoracic Surgery (Cardiothoracic Vascular Surgery) | Admitting: *Deleted

## 2024-01-21 DIAGNOSIS — Z4802 Encounter for removal of sutures: Secondary | ICD-10-CM

## 2024-01-21 NOTE — Telephone Encounter (Signed)
Needs office visit with any available provider.

## 2024-01-21 NOTE — Progress Notes (Signed)
 Patient arrived for nurse visit to remove sutures post-CABG 6/13.  Two sutures removed with no signs or symptoms of infection noted.  Patient tolerated suture removal well.  Patient and family instructed to keep the incision site clean and dry. Patient and family acknowledged instructions given. Reviewed upcoming appts with patient and answered all questions.

## 2024-01-23 ENCOUNTER — Telehealth (HOSPITAL_COMMUNITY): Payer: Self-pay

## 2024-01-23 NOTE — Telephone Encounter (Signed)
 Pt is not interested in the cardiac rehab program at this time. Closed referral.

## 2024-01-26 NOTE — Progress Notes (Unsigned)
 Cardiology Office Note    Date:  01/28/2024  ID:  Samuel Chambers, Samuel Chambers Samuel Chambers, MRN [id number] PCP:  Purcell Emil Schanz, MD  Cardiologist:  Jerel Balding, MD  Electrophysiologist:  None   Chief Complaint: Follow up for CAD   History of Present Illness: .   Samuel Chambers is a 73 y.o. male with visit-pertinent history of CAD, DM, hypertension, hyperlipidemia, fibromyalgia and GERD.  Patient with history of DES placed in his circumflex in 2008.  Echocardiogram in July 2024 with LVEF 60 to 65%, moderate aortic stenosis.  Patient has previously been followed by Dr. Balding, patient was seen by Dr. Verlin on 12/18/2023 for an add-on visit with complaints of dyspnea and chest pressure.  PET/CT stress test on 12/30/2023 indicated inferior wall perfusion defect consistent with ischemia.  He was seen by Dr. Verlin on 01/01/2024 for follow-up regarding stress test he reported ongoing dyspnea with exertion and chest pressure.  Patient reported chronic diffuse body aches without recent changes.  Given ongoing dyspnea with exertion and exertional chest pressure concerning for unstable angina a right and left cardiac catheterization was arranged.  Cardiac catheterization showed severe multivessel CAD with proximal RCA 95% ulcerated stenosis is culprit for stress test followed by 80% distal, long segment in the LAD after first diagonal 60%, 70% and 90% stenosis, proximal first tag with 90% stenosis, previously placed AV groove circumflex stent had 80% ISR, by catheterization the aortic valve gradient was not significant.  He had moderately elevated LVEDP and PCWP of 23 to 24 mmHg suggestive of diastolic heart failure.  It was recommended that patient have admission to inpatient CVTS consultation.  Echocardiogram on 01/07/2024 indicated LVEF 60 to 65%, no RWMA, mild concentric LVH, diastolic parameters were indeterminate, RV systolic function and size was normal, mitral valve with trivial regurgitation  and no evidence of stenosis, aortic valve had moderate calcification with moderate aortic valve stenosis and mild regurgitation.  On 01/09/2024 patient underwent CABG x 3 utilizing LIMA to LAD, SVG to PDA and SVG to diagonal as well as endoscopic harvest of the right greater saphenous vein.  After intraoperative TEE was not felt the aortic valve required replacement.  Patient developed postoperative atrial fibrillation with RVR, he was treated with IV amiodarone  drip, he did not initially convert but rate improved and then developed intermittent atrial fibrillation.  IV amiodarone  was transitioned to p.o. amiodarone .  Patient was hypertensive, metoprolol  was titrated and he was restarted on low-dose Cozaar .  Patient was discharged on 01/14/2024 in stable condition.  Today he presents for follow-up.  He reports that he is overall doing well.  Patient reports concerns regarding use of amiodarone .  Patient and his wife report that in the 2 to 3 hours after taking his amiodarone  he will have significant shortness of breath and increased coughing resulting in pain at his surgical site.  Patient reports significant discomfort after taking amiodarone , reports that this typically resolves after 3 hours and then he feels he is back to his normal.  Patient reports during these times frames he does very well and does not have any shortness of breath.  Both he and his wife feel that this is related to amiodarone  use.  Patient denies any palpitations or feeling of irregular heartbeats, although of note he reports that he was not aware of being in atrial fibrillation when in the hospital.  Patient reports increased musculoskeletal pain related to his surgery, typically finds relief with Tylenol , notes this does worsen following taking  amiodarone  with increased coughing.  Patient denies any increased lower extremity edema, orthopnea or PND.  He denies any presyncope or syncope.  They also note concern regarding his chest tube  sites, reports that they have been leaking and are not fully healed.   ROS: .   Today he denies lower extremity edema, fatigue, palpitations, melena, hematuria, hemoptysis, diaphoresis, weakness, presyncope, syncope, orthopnea, and PND.  All other systems are reviewed and otherwise negative. Studies Reviewed: SABRA   EKG:  EKG is ordered today, personally reviewed, demonstrating  EKG Interpretation Date/Time:  Wednesday January 28 2024 16:43:08 EDT Ventricular Rate:  67 PR Interval:  240 QRS Duration:  86 QT Interval:  430 QTC Calculation: 454 R Axis:   -28  Text Interpretation: Sinus rhythm with 1st degree A-V block When compared with ECG of 10-Jan-2024 07:09, PR interval has increased Nonspecific T wave abnormality no longer evident in Inferior leads T wave inversion now evident in Anterior leads Confirmed by Callan Yontz [phone]) on 01/28/2024 7:46:31 PM   CV Studies: Cardiac studies reviewed are outlined and summarized above. Otherwise please see EMR for full report. Cardiac Studies & Procedures   ______________________________________________________________________________________________ CARDIAC CATHETERIZATION  CARDIAC CATHETERIZATION 01/06/2024  Conclusion Images from the original result were not included.    1st Diag lesion is 90% stenosed.   Prox LAD to Mid LAD lesion is 60% stenosed.  Mid LAD lesion is 70% stenosed. Mid LAD to Dist LAD lesion is 90% stenosed.  (Long segment of combined tandem lesions)   LPAV lesion is 80% stenosed.   Prox RCA-1 lesion is 20% stenosed.   Prox RCA-2 lesion is 95% stenosed (discrete eccentric/ulcerated).  Dist RCA lesion is 80% stenosed.   Hemodynamic findings consistent with mild pulmonary hypertension (mostly WHO Class III related to DHF)).  Dominance: Right.  POSTOP CATH FINDINGS Severe multivessel CAD: Notably proximal RCA 95% ulcerated stenosis as culprit for stress test followed by 80% distal; long segment in the LAD after 1st Diag with 60%,  70% and 90% stenoses Prox 1st Diag with 90% stenosis, previously placed AV groove circumflex stent has 80% ISR (not target for bypass) By catheterization, the aortic valve gradient is not significant. Moderately elevated LVEDP and PCWP of 23-24 mmHg suggestive of Diastolic Heart Failure  RECOMMENDATIONS Based on the patient having progressive angina class III-IV with some resting symptoms and now documented severe three-vessel disease, recommend admission to inpatient for inpatient CVTS consultation.   Anticipated discharge date to be determined. Will place on IV heparin  based on unstable presentation and unstable plaque in the RCA. Will check 2D echocardiogram, especially in light of his previously documented moderate AS He has been statin intolerant, will need to address lipid management on discharge. Will home BP meds, but will replace HCTZ with p.o. Lasix     Alm Clay, MD  Findings Coronary Findings Diagnostic  Dominance: Right  Left Main Vessel was injected. Vessel is normal in caliber.  Left Anterior Descending Prox LAD to Mid LAD lesion is 60% stenosed. Mid LAD lesion is 70% stenosed. Mid LAD to Dist LAD lesion is 90% stenosed.  First Diagonal Branch There is mild disease in the vessel. 1st Diag lesion is 90% stenosed.  Second Diagonal Branch Vessel is small in size.  Left Circumflex Vessel is moderate in size.  Left Posterior Atrioventricular Artery Vessel is small in size. LPAV lesion is 80% stenosed. The lesion is distal to major branch and focal. The lesion was previously treated using a drug eluting stent over 2  years ago. Previously placed stent displays restenosis.  Right Coronary Artery Vessel was injected. Vessel is normal in caliber. There is severe focal disease in the vessel. The vessel is tortuous. Prox RCA-1 lesion is 20% stenosed. Prox RCA-2 lesion is 95% stenosed. The lesion is type B2, located at the bend, discrete and ulcerative. The lesion is  calcified. Dist RCA lesion is 80% stenosed.  Right Ventricular Branch Vessel is small in size.  Right Posterior Descending Artery Vessel is small in size.  First Right Posterolateral Branch Vessel is small in size.  Intervention  No interventions have been documented.   STRESS TESTS  NM PET CT CARDIAC PERFUSION MULTI W/ABSOLUTE BLOODFLOW 12/30/2023  Narrative   Basal to mid inferior reversible perfusion defect consistent wtih ischemia.  Myocardial blood flow reserve is mildly reduced (1.9), though more significantly reduced in area of perfusion defect (1.4).  TID is present (1.18), which is a high risk finding.  No drop in EF with stress.  Overall, findings suggest inferior ischemia and study is intermediate risk   LV perfusion is abnormal. There is evidence of ischemia. Defect 1: There is a small defect with mild reduction in uptake present in the mid to basal inferior location(s) that is reversible. There is normal wall motion in the defect area. Consistent with ischemia. The defect is consistent with abnormal perfusion in the RCA territory.   Rest left ventricular function is normal. Rest EF: 69%. Stress left ventricular function is normal. Stress EF: 73%. End diastolic cavity size is normal. End systolic cavity size is normal.   Myocardial blood flow was computed to be 0.41ml/g/min at rest and 1.82ml/g/min at stress. Global myocardial blood flow reserve was 1.90 and was mildly abnormal.   Coronary calcium assessment not performed due to prior revascularization.   Findings are consistent with ischemia. The study is intermediate risk.   Electronically signed by Lonni Nanas, MD  CLINICAL DATA:  This over-read does not include interpretation of cardiac or coronary anatomy or pathology. No interpretation the PET data set. The cardiac PET-CT interpretation by the cardiologist is attached.  COMPARISON:  None Available.  FINDINGS: Limited view of the lung parenchyma  demonstrates no suspicious nodularity. Airways are normal.  Limited view of the mediastinum demonstrates no adenopathy. Esophagus normal.  Limited view of the upper abdomen demonstrates low-attenuation in the liver. Postcholecystectomy.  Limited view of the skeleton and chest wall is unremarkable.  IMPRESSION: 1. No acute pulmonary findings. 2. Hepatic steatosis.   Electronically Signed By: Jackquline Boxer M.D. On: 12/30/2023 14:14   ECHOCARDIOGRAM  ECHOCARDIOGRAM COMPLETE 01/07/2024  Narrative ECHOCARDIOGRAM REPORT    Patient Name:   Jamorris Ndiaye Date of Exam: 01/07/2024 Medical Rec #:  [id number]          Height:       76.0 in Accession #:    [id number]         Weight:       266.3 lb Date of Birth:  [birth year]           BSA:          2.502 m Patient Age:    72 years           BP:           159/76 mmHg Patient Gender: M                  HR:           79 bpm. Exam Location:  Inpatient  Procedure: 2D Echo, Cardiac Doppler and Color Doppler (Both Spectral and Color Flow Doppler were utilized during procedure).  Indications:    I35.0 Nonrheumatic aortic (valve) stenosis  History:        Patient has prior history of Echocardiogram examinations, most recent 02/11/2023.  Sonographer:    Eva Lash Referring Phys: CALLIE E GOODRICH  IMPRESSIONS   1. Left ventricular ejection fraction, by estimation, is 60 to 65%. The left ventricle has normal function. The left ventricle has no regional wall motion abnormalities. There is mild concentric left ventricular hypertrophy. Left ventricular diastolic parameters are indeterminate. 2. Right ventricular systolic function is normal. The right ventricular size is normal. 3. The mitral valve is normal in structure. Trivial mitral valve regurgitation. No evidence of mitral stenosis. 4. The aortic valve is calcified. There is moderate calcification of the aortic valve. There is moderate thickening of the aortic valve. Aortic valve  regurgitation is mild. Moderate aortic valve stenosis. Aortic valve mean gradient measures 21.0 mmHg. Aortic valve Vmax measures 3.26 m/s. 5. Cannot exclude a small PFO.  Comparison(s): No significant change from prior study.  FINDINGS Left Ventricle: Left ventricular ejection fraction, by estimation, is 60 to 65%. The left ventricle has normal function. The left ventricle has no regional wall motion abnormalities. Definity  contrast agent was given IV to delineate the left ventricular endocardial borders. The left ventricular internal cavity size was normal in size. There is mild concentric left ventricular hypertrophy. Left ventricular diastolic parameters are indeterminate.  Right Ventricle: The right ventricular size is normal. No increase in right ventricular wall thickness. Right ventricular systolic function is normal.  Left Atrium: Left atrial size was normal in size.  Right Atrium: Right atrial size was normal in size.  Pericardium: There is no evidence of pericardial effusion.  Mitral Valve: The mitral valve is normal in structure. Trivial mitral valve regurgitation. No evidence of mitral valve stenosis.  Tricuspid Valve: The tricuspid valve is grossly normal. Tricuspid valve regurgitation is trivial. No evidence of tricuspid stenosis.  Aortic Valve: The aortic valve is calcified. There is moderate calcification of the aortic valve. There is moderate thickening of the aortic valve. Aortic valve regurgitation is mild. Moderate aortic stenosis is present. Aortic valve mean gradient measures 21.0 mmHg. Aortic valve peak gradient measures 42.5 mmHg. Aortic valve area, by VTI measures 1.64 cm.  Pulmonic Valve: The pulmonic valve was not well visualized. Pulmonic valve regurgitation is mild. No evidence of pulmonic stenosis.  Aorta: The aortic root and ascending aorta are structurally normal, with no evidence of dilitation. Ascending aorta measurements are within normal limits for age  when indexed to body surface area.  Venous: The inferior vena cava was not well visualized.  IAS/Shunts: Cannot exclude a small PFO.   LEFT VENTRICLE PLAX 2D LVIDd:         4.60 cm      Diastology LVIDs:         2.90 cm      LV e' medial:    5.77 cm/s LV PW:         1.50 cm      LV E/e' medial:  16.2 LV IVS:        1.20 cm      LV e' lateral:   9.68 cm/s LVOT diam:     2.20 cm      LV E/e' lateral: 9.6 LV SV:         116 LV SV Index:   46 LVOT  Area:     3.80 cm  LV Volumes (MOD) LV vol d, MOD A2C: 103.0 ml LV vol d, MOD A4C: 127.0 ml LV vol s, MOD A2C: 28.0 ml LV vol s, MOD A4C: 33.7 ml LV SV MOD A2C:     75.0 ml LV SV MOD A4C:     127.0 ml LV SV MOD BP:      85.9 ml  RIGHT VENTRICLE RV S prime:     18.00 cm/s TAPSE (M-mode): 2.2 cm  LEFT ATRIUM             Index LA diam:        4.80 cm 1.92 cm/m LA Vol (A2C):   52.7 ml 21.06 ml/m LA Vol (A4C):   41.1 ml 16.43 ml/m LA Biplane Vol: 48.3 ml 19.31 ml/m AORTIC VALVE AV Area (Vmax):    1.53 cm AV Area (Vmean):   1.42 cm AV Area (VTI):     1.64 cm AV Vmax:           326.00 cm/s AV Vmean:          213.000 cm/s AV VTI:            0.708 m AV Peak Grad:      42.5 mmHg AV Mean Grad:      21.0 mmHg LVOT Vmax:         131.00 cm/s LVOT Vmean:        79.800 cm/s LVOT VTI:          0.305 m LVOT/AV VTI ratio: 0.43  AORTA Ao Asc diam: 3.80 cm  MITRAL VALVE MV Area (PHT): 3.13 cm    SHUNTS MV Decel Time: 242 msec    Systemic VTI:  0.30 m MV E velocity: 93.30 cm/s  Systemic Diam: 2.20 cm MV A velocity: 82.30 cm/s MV E/A ratio:  1.13  Shelda Bruckner MD Electronically signed by Shelda Bruckner MD Signature Date/Time: 01/07/2024/1:55:49 PM    Final   TEE  ECHO INTRAOPERATIVE TEE 01/09/2024  Narrative *INTRAOPERATIVE TRANSESOPHAGEAL REPORT *    Patient Name:   LYNWOOD UBALDO BLUSH Date of Exam: 01/09/2024 Medical Rec #:  [id number]          Height:       76.0 in Accession #:    [id number]          Weight:       262.0 lb Date of Birth:  [birth year]           BSA:          2.48 m Patient Age:    72 years           BP:           140/78 mmHg Patient Gender: M                  HR:           58 bpm. Exam Location:  Inpatient  Transesophogeal exam was perform intraoperatively during surgical procedure. Patient was closely monitored under general anesthesia during the entirety of examination.  Indications:     coronary artery disease. Sonographer:     Tinnie Barefoot RDCS Performing Phys: Debby Like MD Diagnosing Phys: Debby Like MD  Complications: No known complications during this procedure. POST-OP IMPRESSIONS _ Left Ventricle: The left ventricle is unchanged from pre-bypass. _ Right Ventricle: The right ventricle appears unchanged from pre-bypass. _ Aorta: The aorta appears unchanged from pre-bypass. _ Left Atrium: The left atrium appears  unchanged from pre-bypass. _ Left Atrial Appendage: The left atrial appendage appears unchanged from pre-bypass. _ Aortic Valve: The aortic valve appears unchanged from pre-bypass. _ Mitral Valve: The mitral valve appears unchanged from pre-bypass. _ Tricuspid Valve: The tricuspid valve appears unchanged from pre-bypass. _ Pulmonic Valve: The pulmonic valve appears unchanged from pre-bypass. _ Interatrial Septum: The interatrial septum appears unchanged from pre-bypass. _ Interventricular Septum: The interventricular septum appears unchanged from pre-bypass. _ Pericardium: The pericardium appears unchanged from pre-bypass.  PRE-OP FINDINGS Left Ventricle: The left ventricle has normal systolic function, with an ejection fraction of 60-65%. The cavity size was normal. There is mild concentric left ventricular hypertrophy.   Right Ventricle: The right ventricle has normal systolic function. The cavity was normal. There is no increase in right ventricular wall thickness.  Left Atrium: Left atrial size was normal in size. No left atrial/left  atrial appendage thrombus was detected. The left atrial appendage is well visualized and there is no evidence of thrombus present.  Right Atrium: Right atrial size was normal in size.  Interatrial Septum: No atrial level shunt detected by color flow Doppler.  Pericardium: There is no evidence of pericardial effusion.  Mitral Valve: The mitral valve is normal in structure. Mitral valve regurgitation is trivial by color flow Doppler. There is No evidence of mitral stenosis.  Tricuspid Valve: The tricuspid valve was normal in structure. Tricuspid valve regurgitation was not visualized by color flow Doppler. No evidence of tricuspid stenosis is present.  Aortic Valve: The aortic valve is tricuspid Aortic valve regurgitation is trivial by color flow Doppler. There is moderate stenosis of the aortic valve. There is moderate aortic annular calcification noted.   Pulmonic Valve: The pulmonic valve was not assessed. Pulmonic valve regurgitation was not assessed by color flow Doppler.   Aorta: The aortic root, ascending aorta and aortic arch are normal in size and structure.   Debby Like MD Electronically signed by Debby Like MD Signature Date/Time: 01/09/2024/1:55:20 PM    Final        ______________________________________________________________________________________________       Current Reported Medications:.    Current Meds  Medication Sig   acetaminophen  (TYLENOL ) 500 MG tablet Take 1,000 mg by mouth every 6 (six) hours as needed for moderate pain (pain score 4-6).   amiodarone  (PACERONE ) 200 MG tablet Take 200 mg by mouth daily.   aspirin  EC 325 MG tablet Take 1 tablet (325 mg total) by mouth daily.   DULoxetine  (CYMBALTA ) 60 MG capsule TAKE 1 CAPSULE BY MOUTH EVERY DAY   guaiFENesin  (MUCINEX ) 600 MG 12 hr tablet Take 1 tablet (600 mg total) by mouth 2 (two) times daily. (Patient taking differently: Take 600 mg by mouth as needed.)   hydrochlorothiazide  (HYDRODIURIL )  25 MG tablet Take 1 tablet (25 mg total) by mouth daily.   losartan  (COZAAR ) 100 MG tablet Take 1 tablet (100 mg total) by mouth daily.   metoprolol  succinate (TOPROL -XL) 50 MG 24 hr tablet Take 1 tablet (50 mg total) by mouth daily.   Physical Exam:    VS:  BP 130/70   Pulse 72   Ht 6' (1.829 m)   Wt 257 lb 3.2 oz (116.7 kg)   SpO2 98%   BMI 34.88 kg/m    Wt Readings from Last 3 Encounters:  01/28/24 257 lb 3.2 oz (116.7 kg)  01/15/24 270 lb (122.5 kg)  01/14/24 272 lb (123.4 kg)    GEN: Well nourished, well developed in no acute distress NECK: No  JVD; No carotid bruits CARDIAC: RRR, no murmurs, rubs, gallops, MSI clean and intact without evidence of infection, chest tube sites partially scabbed, left with slight serous drainage, no erythema  RESPIRATORY:  Clear to auscultation without rales, wheezing or rhonchi  ABDOMEN: Soft, non-tender, non-distended EXTREMITIES:  No edema; No acute deformity     Asessement and Plan:.    CAD: s/p CABG x 3 utilizing LIMA to LAD, SVG to PDA and SVG to diagonal as well as endoscopic harvest of the right greater saphenous vein on 01/09/24.  Patient reports that he is overall doing well, reports musculoskeletal pain related to midsternal incision, notes increased pain with coughing felt to be related to amiodarone  noted below.  Patient reports that aside from feeling side effects from amiodarone  he has overall done well.  He denies any increased lower extremity edema, orthopnea or PND.  Patient's midsternal incision site has healed well, his chest tube sites have not yet fully healed, sites have partially scabbed over, left chest tube site with some clear drainage, no evidence of pus or erythema.  Patient plans to call T CTS for further recommendations.  Will check CBC, c-Met.   Post op atrial fibrillation/shortness of breath: Patient with postoperative atrial fibrillation, was loaded with amiodarone  while in hospital.  He is currently taking amiodarone   200 mg twice daily, patient reports that in the 2 to 3 hours after taking amiodarone  he has significant shortness of breath and increased coughing resulting in increased chest discomfort.  Patient reports that in the hours between doses he overall feels very well.  Patient denies any lower extremity edema, orthopnea or PND.  He and his wife both feel it is related to amiodarone  given timing.  EKG today indicates sinus rhythm.  Patient denies any palpitations or feeling of irregular rhythm however reports that he was not aware he was in atrial fibrillation while inpatient.  Given significant shortness of breath and coughing question amiodarone  toxicity.  Will discontinue amiodarone  given intolerance, patient agreeable to wearing a 30-day cardiac monitor to assess for further atrial fibrillation.  Continue metoprolol .  Will also check chest x-ray given increased shortness of breath, lung sounds are clear.  Reviewed ED precautions with patient.  HTN: Blood pressure today 130/70.  Continue current antihypertensive regimen.  HLD: Patient to see Pharm.D. next week for consideration of Leqvio.  Check fasting lipid profile, repeat in 6 weeks.  Aortic valve stenosis: Echo on 01/07/2024 indicated aortic valve had moderate calcification with moderate aortic valve stenosis and mild regurgitation.  Not felt to be significant enough for AVR during procedure.   Disposition: F/u with Geri Hepler, NP in 6 weeks.   Signed, Hasini Peachey D Efrain Clauson, NP

## 2024-01-28 ENCOUNTER — Other Ambulatory Visit (HOSPITAL_COMMUNITY)

## 2024-01-28 ENCOUNTER — Ambulatory Visit: Attending: Cardiology | Admitting: Cardiology

## 2024-01-28 ENCOUNTER — Encounter: Payer: Self-pay | Admitting: Cardiology

## 2024-01-28 VITALS — BP 130/70 | HR 72 | Ht 72.0 in | Wt 257.2 lb

## 2024-01-28 DIAGNOSIS — I350 Nonrheumatic aortic (valve) stenosis: Secondary | ICD-10-CM | POA: Insufficient documentation

## 2024-01-28 DIAGNOSIS — I2510 Atherosclerotic heart disease of native coronary artery without angina pectoris: Secondary | ICD-10-CM | POA: Diagnosis not present

## 2024-01-28 DIAGNOSIS — E782 Mixed hyperlipidemia: Secondary | ICD-10-CM | POA: Insufficient documentation

## 2024-01-28 DIAGNOSIS — R059 Cough, unspecified: Secondary | ICD-10-CM | POA: Insufficient documentation

## 2024-01-28 DIAGNOSIS — I480 Paroxysmal atrial fibrillation: Secondary | ICD-10-CM | POA: Diagnosis not present

## 2024-01-28 DIAGNOSIS — I10 Essential (primary) hypertension: Secondary | ICD-10-CM | POA: Insufficient documentation

## 2024-01-28 DIAGNOSIS — R0602 Shortness of breath: Secondary | ICD-10-CM | POA: Insufficient documentation

## 2024-01-28 NOTE — Patient Instructions (Addendum)
 Medication Instructions:  Stop Amiodarone  *If you need a refill on your cardiac medications before your next appointment, please call your pharmacy*  Lab Work: Tomorrow we are going to get CBC, and Cmet Fasting lipid in 6 weeks If you have labs (blood work) drawn today and your tests are completely normal, you will receive your results only by: MyChart Message (if you have MyChart) OR A paper copy in the mail If you have any lab test that is abnormal or we need to change your treatment, we will call you to review the results.  Testing/Procedures: We are going to get you scheduled chest x-ray  Follow-Up: At [facility], you and your health needs are our priority.  As part of our continuing mission to provide you with exceptional heart care, our providers are all part of one team.  This team includes your primary Cardiologist (physician) and Advanced Practice Providers or APPs (Physician Assistants and Nurse Practitioners) who all work together to provide you with the care you need, when you need it.  Your next appointment:   6 week(s)  Provider:   Katlyn West, NP    We recommend signing up for the patient portal called MyChart.  Sign up information is provided on this After Visit Summary.  MyChart is used to connect with patients for Virtual Visits (Telemedicine).  Patients are able to view lab/test results, encounter notes, upcoming appointments, etc.  Non-urgent messages can be sent to your provider as well.   To learn more about what you can do with MyChart, go to [email or web address].   Other Instructions Please take your blood pressure daily for 2 weeks and send in a MyChart message. Please include heart rates. (One message at the end of the 2 weeks).   HOW TO TAKE YOUR BLOOD PRESSURE: Rest 5 minutes before taking your blood pressure. Don't smoke or drink caffeinated beverages for at least 30 minutes before. Take your blood pressure before (not after) you eat. Sit  comfortably with your back supported and both feet on the floor (don't cross your legs). Elevate your arm to heart level on a table or a desk. Use the proper sized cuff. It should fit smoothly and snugly around your bare upper arm. There should be enough room to slip a fingertip under the cuff. The bottom edge of the cuff should be 1 inch above the crease of the elbow. Ideally, take 3 measurements at one sitting and record the average.

## 2024-01-29 ENCOUNTER — Ambulatory Visit (HOSPITAL_COMMUNITY)
Admission: RE | Admit: 2024-01-29 | Discharge: 2024-01-29 | Disposition: A | Discharge status: Home / Self Care | Source: Ambulatory Visit | Attending: Cardiology | Admitting: Cardiology

## 2024-01-29 DIAGNOSIS — R0602 Shortness of breath: Secondary | ICD-10-CM | POA: Insufficient documentation

## 2024-01-29 DIAGNOSIS — R059 Cough, unspecified: Secondary | ICD-10-CM | POA: Diagnosis present

## 2024-01-30 LAB — CBC
Hematocrit: 40.9 % (ref 37.5–51.0)
Hemoglobin: 12.8 g/dL — ABNORMAL LOW (ref 13.0–17.7)
MCH: 28.3 pg (ref 26.6–33.0)
MCHC: 31.3 g/dL — ABNORMAL LOW (ref 31.5–35.7)
MCV: 90 fL (ref 79–97)
Platelets: 453 x10E3/uL — ABNORMAL HIGH (ref 150–450)
RBC: 4.53 x10E6/uL (ref 4.14–5.80)
RDW: 14 % (ref 11.6–15.4)
WBC: 8.4 x10E3/uL (ref 3.4–10.8)

## 2024-01-30 LAB — COMPREHENSIVE METABOLIC PANEL WITH GFR
ALT: 22 IU/L (ref 0–44)
AST: 26 IU/L (ref 0–40)
Albumin: 4.5 g/dL (ref 3.8–4.8)
Alkaline Phosphatase: 83 IU/L (ref 44–121)
BUN/Creatinine Ratio: 15 (ref 10–24)
BUN: 17 mg/dL (ref 8–27)
Bilirubin Total: 0.4 mg/dL (ref 0.0–1.2)
CO2: 20 mmol/L (ref 20–29)
Calcium: 9.7 mg/dL (ref 8.6–10.2)
Chloride: 98 mmol/L (ref 96–106)
Creatinine, Ser: 1.16 mg/dL (ref 0.76–1.27)
Globulin, Total: 2.8 g/dL (ref 1.5–4.5)
Glucose: 104 mg/dL — ABNORMAL HIGH (ref 70–99)
Potassium: 4.2 mmol/L (ref 3.5–5.2)
Sodium: 138 mmol/L (ref 134–144)
Total Protein: 7.3 g/dL (ref 6.0–8.5)
eGFR: 67 mL/min/1.73 (ref >59)

## 2024-02-02 ENCOUNTER — Ambulatory Visit: Payer: Self-pay | Admitting: Cardiology

## 2024-02-02 ENCOUNTER — Ambulatory Visit: Attending: Cardiology | Admitting: Pharmacist

## 2024-02-02 VITALS — BP 140/90 | HR 85

## 2024-02-02 DIAGNOSIS — E785 Hyperlipidemia, unspecified: Secondary | ICD-10-CM | POA: Diagnosis present

## 2024-02-02 DIAGNOSIS — I10 Essential (primary) hypertension: Secondary | ICD-10-CM | POA: Diagnosis present

## 2024-02-02 MED ORDER — HYDRALAZINE HCL 25 MG PO TABS: 12.5000 mg | ORAL_TABLET | Freq: Three times a day (TID) | ORAL | 3 refills | Status: DC

## 2024-02-02 NOTE — Patient Instructions (Addendum)
 Please start taking hydralazine  12.5mg  (1/2 tablet) three times a day Continue losartan  100mg  daily, hydrochlorothiazide  25mg  daily and metoprolol  succinate 50mg  daily

## 2024-02-02 NOTE — Progress Notes (Signed)
 Patient ID: Samuel Chambers                 DOB: Mar 20, 1951                    MRN: [id number]      HPI: Samuel Chambers is a 73 y.o. male patient referred to lipid clinic by Dr. Francyne. PMH is significant for drug eluting stent placed in his Circumflex in 2008, s/p CABG x 3 utilizing LIMA to LAD, SVG to PDA and SVG to diagonal as well as endoscopic harvest of the right greater saphenous vein on 01/09/24, HTN, post op afib, TIA  Patient presents today accompanied by his wife.  His biggest frustration is that his blood pressure has been high ever since his CABG.  He states that he was told that his home blood pressure machine was inaccurate.  He is getting readings in the 150s and occasionally 140s at home.  Had his blood pressure checked twice here, once when compared to his home blood pressure cuff and was told his blood pressure was 130 something over 70 something.  Today in clinic I got a blood pressure of 160/90 and then 140/90 on repeat.  Very frustrated that we told him that his blood pressure was fine and that his home machine was an accurate.  Also frustrated that his blood pressure is elevated.  Feels like he feels worse after his surgery and that he should have had it.  He is also frustrated with a cough he has had that will not seem to go away.  He feels much better off of amiodarone  but cough is still persistent.  Was taking old Tessalon  Perles he had at home but has since run out.  Now taking a mixture of honey lemon juice and rye to help his cough.  We talked about the effects that alcohol could have on his blood pressure.  He does state that his blood pressure was high even prior to him doing this.  Things he is drinking about 1-1/2 tablespoons of rye.  He has a lot of sensitivities to medications.  Often experiences the very rare side effects.  Statins make him hurt significantly.  I have previously seen him in lipid clinic where he had declined both PCSK9 and Leqvio.  We talked  about both medications including cost and potential side effects.  He has [employer] supplement which would prefer Praluent.  This could be dosed at lower dose of 75 mg.  Would need healthwell grant if he qualified. Leqvio would be free with his Medicare and supplement.  Theoretically only in bloodstream for 24 to 48 hours.  Current Medications: None Intolerances: rosuvastatin, atorvastatin (leg, back pain), ezetimibe ,  Risk Factors: CABG, questionable TIA, hypertension, age LDL-C goal: Less than 70  Exercise: Just had CABG, prior was very active  Family History:  Family History  Problem Relation Age of Onset   Breast cancer Mother    Diabetes Mother    Prostate cancer Father    Colon cancer Neg Hx    Colon polyps Neg Hx    Esophageal cancer Neg Hx    Rectal cancer Neg Hx    Stomach cancer Neg Hx    Ulcerative colitis Neg Hx    Crohn's disease Neg Hx     Social History:  Social History   Socioeconomic History   Marital status: Married    Spouse name: Macario   Number of children: 1   Years of  education: Not on file   Highest education level: 12th grade  Occupational History    Employer: [employer]   Occupation: RETIRED  Tobacco Use   Smoking status: Never    Passive exposure: Past (PARENTS SMOKED)   Smokeless tobacco: Never  Vaping Use   Vaping status: Never Used  Substance and Sexual Activity   Alcohol use: No   Drug use: No   Sexual activity: Not on file  Other Topics Concern   Not on file  Social History Narrative   Very little 0-2 drinks a week      Lives with wife and 2 dogs   Social Drivers of Corporate investment banker Strain: Low Risk  (11/15/2023)   Overall Financial Resource Strain (CARDIA)    Difficulty of Paying Living Expenses: Not hard at all  Food Insecurity: No Food Insecurity (01/15/2024)   Hunger Vital Sign    Worried About Running Out of Food in the Last Year: Never true    Ran Out of Food in the Last Year: Never true  Transportation  Needs: No Transportation Needs (01/15/2024)   PRAPARE - Administrator, Civil Service (Medical): No    Lack of Transportation (Non-Medical): No  Physical Activity: Unknown (11/15/2023)   Exercise Vital Sign    Days of Exercise per Week: Patient declined    Minutes of Exercise per Session: 0 min  Stress: No Stress Concern Present (11/15/2023)   [employer] of Occupational Health - Occupational Stress Questionnaire    Feeling of Stress : Not at all  Social Connections: Socially Integrated (01/06/2024)   Social Connection and Isolation Panel    Frequency of Communication with Friends and Family: More than three times a week    Frequency of Social Gatherings with Friends and Family: More than three times a week    Attends Religious Services: More than 4 times per year    Active Member of Clubs or Organizations: Yes    Attends Banker Meetings: 1 to 4 times per year    Marital Status: Married  Catering manager Violence: Not At Risk (01/15/2024)   Humiliation, Afraid, Rape, and Kick questionnaire    Fear of Current or Ex-Partner: No    Emotionally Abused: No    Physically Abused: No    Sexually Abused: No     Labs: Lipid Panel     Component Value Date/Time   CHOL 172 02/11/2023 0420   CHOL 177 06/20/2021 0841   TRIG 173 (H) 02/11/2023 0420   HDL 32 (L) 02/11/2023 0420   HDL 44 06/20/2021 0841   CHOLHDL 5.4 02/11/2023 0420   VLDL 35 02/11/2023 0420   LDLCALC 105 (H) 02/11/2023 0420   LDLCALC 117 (H) 06/20/2021 0841   LABVLDL 16 06/20/2021 0841    Past Medical History:  Diagnosis Date   Adenomatous colon polyp 03/2005   Anxiety    CAD S/P percutaneous coronary angioplasty 2008   PCI to AVG Cx with a Promus DES 2.5 mm x 8 mm   Cataract    BILATERAL,REMOVED   Degenerative joint disease    Diabetes mellitus without complication (HCC)    diet controlled,PT.DENIES UPDATED 04/10/22   Diverticulosis    Fatty liver 2016   pt denies   Fibromyalgia     GERD (gastroesophageal reflux disease)    Heart murmur    Phreesia 07/29/2020   Hyperlipidemia    statin intolerant   Hypertension    Internal hemorrhoids  Obesity, Class II, BMI 35-39.9    Pneumonia 2016   PONV (postoperative nausea and vomiting)     Current Outpatient Medications on File Prior to Visit  Medication Sig Dispense Refill   aspirin  EC 325 MG tablet Take 1 tablet (325 mg total) by mouth daily.     DULoxetine  (CYMBALTA ) 60 MG capsule TAKE 1 CAPSULE BY MOUTH EVERY DAY 90 capsule 1   hydrochlorothiazide  (HYDRODIURIL ) 25 MG tablet Take 1 tablet (25 mg total) by mouth daily. 90 tablet 3   losartan  (COZAAR ) 100 MG tablet Take 1 tablet (100 mg total) by mouth daily. 90 tablet 3   metoprolol  succinate (TOPROL -XL) 50 MG 24 hr tablet Take 1 tablet (50 mg total) by mouth daily. 90 tablet 3   acetaminophen  (TYLENOL ) 500 MG tablet Take 1,000 mg by mouth every 6 (six) hours as needed for moderate pain (pain score 4-6).     No current facility-administered medications on file prior to visit.    Allergies  Allergen Reactions   Amlodipine  Swelling    Patient had severe swelling   Crestor [Rosuvastatin] Other (See Comments)    myalgias   Silodosin Palpitations   Valsartan  Other (See Comments)    Made patient feel tired and no energy   Warfarin And Related Palpitations    Fast heart beat, went down hill, felt like he was dying    Assessment/Plan:  1. Hyperlipidemia -  Essential hypertension Assessment: Rightfully so, patient very frustrated with the elevations in his blood pressure postsurgery.  Wanting an explanation for why his blood pressure is higher which I do not have a good reasoning for other than potentially lack of activity postsurgery He did not tolerate valsartan  or amlodipine  He was given hydralazine  inpatient for his blood pressure Patient very concerned about his blood pressure and frustrated that 2 different CMA's previously told him his blood pressure was in  the 130s and that his home blood pressure cuff was inaccurate.  He feels as though they did not accurately check his blood pressure  Plan: Start hydralazine  12.5 mg 3 times a day Continue HCTZ 25 mg daily, losartan  100 mg daily and metoprolol  succinate 50 mg daily Follow-up with Katyln in about a month  Dyslipidemia Assessment: Patient with significant side effects to medications. We did discuss the potential benefits of treating his cholesterol and the potential side effects Patient very hesitant to try either PCSK9 or Leqvio Patient would like more time to think about it  Plan: Patient will contact me if he decides he like to proceed.  Appears he is leaning more towards Leqvio.    Thank you,  Sarinity Dicicco D Fauna Neuner, Pharm.JONETTA SARAN, CPP [facility] [address], [place], [place] [address]  Phone: [phone]; Fax: [phone]

## 2024-02-02 NOTE — Assessment & Plan Note (Signed)
 Assessment: Patient with significant side effects to medications. We did discuss the potential benefits of treating his cholesterol and the potential side effects Patient very hesitant to try either PCSK9 or Leqvio Patient would like more time to think about it  Plan: Patient will contact me if he decides he like to proceed.  Appears he is leaning more towards Leqvio.

## 2024-02-02 NOTE — Assessment & Plan Note (Signed)
 Assessment: Rightfully so, patient very frustrated with the elevations in his blood pressure postsurgery.  Wanting an explanation for why his blood pressure is higher which I do not have a good reasoning for other than potentially lack of activity postsurgery He did not tolerate valsartan  or amlodipine  He was given hydralazine  inpatient for his blood pressure Patient very concerned about his blood pressure and frustrated that 2 different CMA's previously told him his blood pressure was in the 130s and that his home blood pressure cuff was inaccurate.  He feels as though they did not accurately check his blood pressure  Plan: Start hydralazine  12.5 mg 3 times a day Continue HCTZ 25 mg daily, losartan  100 mg daily and metoprolol  succinate 50 mg daily Follow-up with Katyln in about a month

## 2024-02-05 NOTE — Telephone Encounter (Signed)
 Called patient advised of below they verbalized understanding.

## 2024-02-05 NOTE — Telephone Encounter (Signed)
-----   Message from Katlyn D [place] sent at 02/02/2024  5:38 PM EDT ----- Please let Samuel Chambers that his CBC shows improving hemoglobin and hematocrit postsurgery, no evidence of infection. His kidney function is normal and his electrolytes are normal.  His liver function  is normal.  His chest x-ray is stable with clear lungs and no evidence of fluid.  Overall good results, discontinue amiodarone  as discussed and follow-up as planned. ----- Message ----- From: Rebecka Memos Lab Results In Sent: 01/30/2024   1:35 AM EDT To: Katlyn D West, NP

## 2024-02-09 ENCOUNTER — Other Ambulatory Visit: Payer: Self-pay | Admitting: Thoracic Surgery (Cardiothoracic Vascular Surgery)

## 2024-02-09 DIAGNOSIS — Z951 Presence of aortocoronary bypass graft: Secondary | ICD-10-CM

## 2024-02-10 ENCOUNTER — Ambulatory Visit: Admitting: Thoracic Surgery (Cardiothoracic Vascular Surgery)

## 2024-02-10 NOTE — Progress Notes (Unsigned)
 [address] [place] [address]             [phone]       HPI: [name] is a 73 year old male with medical history HTN, CAD, GERD and arthritis presents for post-operative follow up.  Patient returns for routine postoperative follow-up having undergone CABG x 3 utilizing LIMA to LAD, SVG to PDA, and SVG to Diagonal as well as endoscopic harvest of the right greater saphenous vein on 01/09/24 with Dr. Kerrin.  The patient's early postoperative recovery while in the hospital was notable for developing atrial fibrillation which was treated with amiodarone . His blood pressure was elevated and he was restarted on cozaar  and metoprolol  was increased. He also started to have a productive cough with green sputum which was treated with mucinex .  Since discharge he has seen cardiology who stopped amiodarone .  They also obtained a chest xray on 01/29/2024 due to patient still complaining of cough.  Chest xray showed no active cardiopulmonary disease.   Patient reports that overall he is doing well.  He states that his pain is well controlled and he only has been using tylenol  sporadically.  He still is experiencing a dry cough.  The cough is worse at night and in the mornings.  He denies chest pain, chest tightness and fever.  He does have some shortness of breath with exertion.  He has been using mucinex , delsym and rye whiskey which provides some relief.  Patient does have a history of GERD and has not been taking any medications for this at this time.  He reports that he has not experienced any symptoms of acid reflux since his surgery.   Current Outpatient Medications  Medication Sig Dispense Refill   acetaminophen  (TYLENOL ) 500 MG tablet Take 1,000 mg by mouth every 6 (six) hours as needed for moderate pain (pain score 4-6).     aspirin  EC 325 MG tablet Take 1 tablet (325 mg total) by mouth daily.     DULoxetine  (CYMBALTA ) 60 MG capsule TAKE 1 CAPSULE BY  MOUTH EVERY DAY 90 capsule 1   hydrochlorothiazide  (HYDRODIURIL ) 25 MG tablet Take 1 tablet (25 mg total) by mouth daily. 90 tablet 3   losartan  (COZAAR ) 100 MG tablet Take 1 tablet (100 mg total) by mouth daily. 90 tablet 3   metoprolol  succinate (TOPROL -XL) 50 MG 24 hr tablet Take 1 tablet (50 mg total) by mouth daily. 90 tablet 3   hydrALAZINE  (APRESOLINE ) 25 MG tablet Take 0.5 tablets (12.5 mg total) by mouth 3 (three) times daily. (Patient not taking: Reported on 02/12/2024) 90 tablet 3   No current facility-administered medications for this visit.   Today's Vitals   02/12/24 0959 02/12/24 1042  BP: (!) 144/75 132/72  Pulse: 80   Resp: 20   SpO2: 97%   Weight: 269 lb (122 kg)   Height: 6' (1.829 m)    Body mass index is 36.48 kg/m.   Physical Exam: Physical Exam Constitutional:      Appearance: Normal appearance.  HENT:     Head: Normocephalic and atraumatic.  Cardiovascular:     Rate and Rhythm: Normal rate and regular rhythm.     Heart sounds: Normal heart sounds, S1 normal and S2 normal.  Pulmonary:     Effort: Pulmonary effort is normal.     Breath sounds: Normal breath sounds.  Musculoskeletal:     Right ankle: No swelling.  Left ankle: No swelling.  Skin:    General: Skin is warm and dry.     Comments: Incisional sites on chest and leg healing well; no erythema or dehiscence   Neurological:     General: No focal deficit present.     Mental Status: He is alert and oriented to person, place, and time.      Diagnostic Tests: CLINICAL DATA:  Status post coronary artery bypass graft.   EXAM: CHEST - 2 VIEW   COMPARISON:  January 29, 2024.   FINDINGS: The heart size and mediastinal contours are within normal limits. Status post coronary artery bypass graft. Both lungs are clear. The visualized skeletal structures are unremarkable.   IMPRESSION: No active cardiopulmonary disease.     Electronically Signed   By: Lynwood Landy Raddle M.D.   On: 02/12/2024  09:59   Plan: We reviewed today's chest x ray. Chest x-ray today shows no active cardiopulmonary disease.  Discussed that his cough may be due to his GERD.  Recommended that patient restart Pepcid  for acid reflux daily.  He should discontinue use of rye whiskey for coughing.  If cough is not resolved than patient should contact his PCP provider for further work up.  We discussed driving and since patient is not longer taking narcotic pain medications he is able to start.  First time should be short distance and in the daytime and he can increase slowly.  Patient is interested in participating in cardiac rehab and is cleared to do so.  Order was placed for patient at this time.  Patient is to continue sternal precautions until 03/05/2024 which is 8 weeks from surgery.  He has seen his cardiologist since discharge and amiodarone  was discontinued.  He is currently wearing a heart monitor for atrial fibrillation.  He is checking his blood pressure at home and should continue to do so.  He should bring the readings to his next cardiology appointment.  He is to continue follow up with cardiology. Patient is to follow up with [facility] as needed.    [name], PA-C [facility] [phone]

## 2024-02-12 ENCOUNTER — Ambulatory Visit: Payer: Self-pay

## 2024-02-12 ENCOUNTER — Ambulatory Visit (HOSPITAL_COMMUNITY)
Admission: RE | Admit: 2024-02-12 | Discharge: 2024-02-12 | Disposition: A | Discharge status: Home / Self Care | Source: Ambulatory Visit | Attending: Cardiology | Admitting: Cardiology

## 2024-02-12 ENCOUNTER — Other Ambulatory Visit: Payer: Self-pay

## 2024-02-12 VITALS — BP 132/72 | HR 80 | Resp 20 | Ht 72.0 in | Wt 269.0 lb

## 2024-02-12 DIAGNOSIS — Z951 Presence of aortocoronary bypass graft: Secondary | ICD-10-CM

## 2024-02-12 NOTE — Progress Notes (Signed)
 Patient seen in office today and referral placed for outpatient Cardiac Rehab per Dr. Chrystal request. Patient does express interest now for Cardiac Rehab.

## 2024-02-12 NOTE — Patient Instructions (Signed)
-  Continue follow up with cardiology appointment -If cough does not improve over the next week, need to follow up with PCP

## 2024-02-13 ENCOUNTER — Other Ambulatory Visit: Payer: Self-pay | Admitting: Emergency Medicine

## 2024-02-13 DIAGNOSIS — F411 Generalized anxiety disorder: Secondary | ICD-10-CM

## 2024-02-18 ENCOUNTER — Encounter: Payer: Self-pay | Admitting: Emergency Medicine

## 2024-02-18 ENCOUNTER — Ambulatory Visit (INDEPENDENT_AMBULATORY_CARE_PROVIDER_SITE_OTHER): Admitting: Emergency Medicine

## 2024-02-18 VITALS — BP 128/70 | HR 96 | Temp 98.2°F | Ht 72.0 in | Wt 265.0 lb

## 2024-02-18 DIAGNOSIS — J22 Unspecified acute lower respiratory infection: Secondary | ICD-10-CM

## 2024-02-18 DIAGNOSIS — R053 Chronic cough: Secondary | ICD-10-CM | POA: Diagnosis not present

## 2024-02-18 MED ORDER — BENZONATATE 200 MG PO CAPS: 200.0000 mg | ORAL_CAPSULE | Freq: Two times a day (BID) | ORAL | 0 refills | Status: DC | PRN

## 2024-02-18 MED ORDER — AMOXICILLIN-POT CLAVULANATE 875-125 MG PO TABS
1.0000 | ORAL_TABLET | Freq: Two times a day (BID) | ORAL | 0 refills | Status: AC
Start: 2024-02-18 — End: 2024-02-25

## 2024-02-18 MED ORDER — HYDROCODONE BIT-HOMATROP MBR 5-1.5 MG/5ML PO SOLN: 5.0000 mL | Freq: Four times a day (QID) | ORAL | 0 refills | Status: DC | PRN

## 2024-02-18 NOTE — Patient Instructions (Signed)
 Acute Bronchitis, Adult  Acute bronchitis is when air tubes in the lungs (bronchi) suddenly get swollen. The condition can make it hard for you to breathe. In adults, acute bronchitis usually goes away within 2 weeks. A cough caused by bronchitis may last up to 3 weeks. Smoking, allergies, and asthma can make the condition worse. What are the causes? Germs that cause cold and flu (viruses). The most common cause of this condition is the virus that causes the common cold. Bacteria. Substances that bother (irritate) the lungs, including: Smoke from cigarettes and other types of tobacco. Dust and pollen. Fumes from chemicals, gases, or burned fuel. Indoor or outdoor air pollution. What increases the risk? A weak body's defense system. This is also called the immune system. Any condition that affects your lungs and breathing, such as asthma. What are the signs or symptoms? A cough. Coughing up clear, yellow, or green mucus. Making high-pitched whistling sounds when you breathe, most often when you breathe out (wheezing). Runny or stuffy nose. Having too much mucus in your lungs (chest congestion). Shortness of breath. Body aches. A sore throat. How is this treated? Acute bronchitis may go away over time without treatment. Your doctor may tell you to: Drink more fluids. This will help thin your mucus so it is easier to cough up. Use a device that gets medicine into your lungs (inhaler). Use a vaporizer or a humidifier. These are machines that add water to the air. This helps with coughing and poor breathing. Take a medicine that thins mucus and helps clear it from your lungs. Take a medicine that prevents or stops coughing. It is not common to take an antibiotic medicine for this condition. Follow these instructions at home:  Take over-the-counter and prescription medicines only as told by your doctor. Use an inhaler, vaporizer, or humidifier as told by your doctor. Take two teaspoons  (10 mL) of honey at bedtime. This helps lessen your coughing at night. Drink enough fluid to keep your pee (urine) pale yellow. Do not smoke or use any products that contain nicotine or tobacco. If you need help quitting, ask your doctor. Get a lot of rest. Return to your normal activities when your doctor says that it is safe. Keep all follow-up visits. How is this prevented?  Wash your hands often with soap and water for at least 20 seconds. If you cannot use soap and water, use hand sanitizer. Avoid contact with people who have cold symptoms. Try not to touch your mouth, nose, or eyes with your hands. Avoid breathing in smoke or chemical fumes. Make sure to get the flu shot every year. Contact a doctor if: Your symptoms do not get better in 2 weeks. You have trouble coughing up the mucus. Your cough keeps you awake at night. You have a fever. Get help right away if: You cough up blood. You have chest pain. You have very bad shortness of breath. You faint or keep feeling like you are going to faint. You have a very bad headache. Your fever or chills get worse. These symptoms may be an emergency. Get help right away. Call your local emergency services (911 in the [place] Do not wait to see if the symptoms will go away. Do not drive yourself to the hospital. Summary Acute bronchitis is when air tubes in the lungs (bronchi) suddenly get swollen. In adults, acute bronchitis usually goes away within 2 weeks. Drink more fluids. This will help thin your mucus so it is easier  to cough up. Take over-the-counter and prescription medicines only as told by your doctor. Contact a doctor if your symptoms do not improve after 2 weeks of treatment. This information is not intended to replace advice given to you by your health care provider. Make sure you discuss any questions you have with your health care provider. Document Revised: 11/15/2020 Document Reviewed: 11/15/2020 Elsevier Patient  Education  2024 [employer].

## 2024-02-18 NOTE — Progress Notes (Signed)
 Samuel Chambers 73 y.o.   Chief Complaint  Patient presents with   Cough    Patient here for cough that has been going on for 5 weeks. Pt had open heart surgery 6 week ago the cough started during, he did an xray last Thursday and it was clear.  Pt has tried OTC medication and has not helped. Did have some per tessalon  perle left and did use that and seemed to help.     HISTORY OF PRESENT ILLNESS: This is a 73 y.o. male complaining of persistent cough for the past 5 to 6 weeks since undergoing CABG surgery At times productive cough.  Most recent chest x-ray from 02/12/2024 does not show pneumonia or pleural effusions. No history of COPD or asthma.  Non-smoker. Tried medication for GERD just in case but not helping.  Last cardiothoracic office assessment and plan as follows: Plan: We reviewed today's chest x ray. Chest x-ray today shows no active cardiopulmonary disease.  Discussed that his cough may be due to his GERD.  Recommended that patient restart Pepcid  for acid reflux daily.  He should discontinue use of rye whiskey for coughing.  If cough is not resolved than patient should contact his PCP provider for further work up.  We discussed driving and since patient is not longer taking narcotic pain medications he is able to start.  First time should be short distance and in the daytime and he can increase slowly.  Patient is interested in participating in cardiac rehab and is cleared to do so.  Order was placed for patient at this time.  Patient is to continue sternal precautions until 03/05/2024 which is 8 weeks from surgery.   He has seen his cardiologist since discharge and amiodarone  was discontinued.  He is currently wearing a heart monitor for atrial fibrillation.  He is checking his blood pressure at home and should continue to do so.  He should bring the readings to his next cardiology appointment.  He is to continue follow up with cardiology. Patient is to follow up with [facility] as  needed.      [name], PA-C [facility] [phone]  Cough Pertinent negatives include no chest pain, chills, fever, headaches, rash, sore throat or shortness of breath.     Prior to Admission medications   Medication Sig Start Date End Date Taking? Authorizing Provider  acetaminophen  (TYLENOL ) 500 MG tablet Take 1,000 mg by mouth every 6 (six) hours as needed for moderate pain (pain score 4-6).   Yes [provider]  amoxicillin -clavulanate (AUGMENTIN ) 875-125 MG tablet Take 1 tablet by mouth 2 (two) times daily for 7 days. 02/18/24 02/25/24 Yes Alvera Tourigny, Emil Schanz, MD  aspirin  EC 325 MG tablet Take 1 tablet (325 mg total) by mouth daily. 01/14/24  Yes Roddenberry, Myron G, PA-C  benzonatate  (TESSALON ) 200 MG capsule Take 1 capsule (200 mg total) by mouth 2 (two) times daily as needed for cough. 02/18/24  Yes Jonerik Sliker, Emil Schanz, MD  DULoxetine  (CYMBALTA ) 60 MG capsule TAKE 1 CAPSULE BY MOUTH EVERY DAY 02/13/24  Yes Estefana Taylor, Emil Schanz, MD  hydrochlorothiazide  (HYDRODIURIL ) 25 MG tablet Take 1 tablet (25 mg total) by mouth daily. 05/26/23  Yes Jezelle Gullick, Emil Schanz, MD  HYDROcodone  bit-homatropine [facility]) 5-1.5 MG/5ML syrup Take 5 mLs by mouth every 6 (six) hours as needed for cough. 02/18/24  Yes Reubin Bushnell, Emil Schanz, MD  losartan  (COZAAR ) 100 MG tablet Take 1 tablet (100 mg total) by mouth daily.  05/26/23  Yes Erminie Foulks, Emil Schanz, MD  metoprolol  succinate (TOPROL -XL) 50 MG 24 hr tablet Take 1 tablet (50 mg total) by mouth daily. 05/26/23  Yes Edwyn Inclan, Emil Schanz, MD    Allergies  Allergen Reactions   Amlodipine  Swelling    Patient had severe swelling   Crestor [Rosuvastatin] Other (See Comments)    myalgias   Silodosin Palpitations   Valsartan  Other (See Comments)    Made patient feel tired and no energy   Warfarin And Related Palpitations    Fast heart beat, went down hill, felt like he was dying    Patient Active Problem  List   Diagnosis Date Noted   S/P CABG x 3 01/09/2024   Coronary artery disease involving native coronary artery of native heart with unstable angina pectoris (HCC) 01/06/2024   Wheezing 07/08/2023   Persistent cough 07/08/2023   Lower respiratory infection 06/18/2023   History of TIA (transient ischemic attack) 05/26/2023   Statin myopathy 03/11/2023   Prediabetes 08/15/2022   Arthritis of hand 07/27/2022   Acquired trigger finger of left ring finger 07/27/2022   Acute cough 04/18/2022   History of gout 02/07/2021   Primary osteoarthritis of right knee 01/24/2020   OA (osteoarthritis) of hip 03/16/2018   Severe obesity (BMI 35.0-35.9 with comorbidity) (HCC) 08/04/2014   Dyslipidemia 04/29/2013   CAD S/P percutaneous coronary angioplasty    S/P laparoscopic cholecystectomy July 2014 02/18/2013   OA (osteoarthritis) of knee 09/04/2012   Situational anxiety 02/24/2007   Essential hypertension 02/24/2007   GERD 02/24/2007   Osteoarthritis of multiple joints 02/24/2007    Past Medical History:  Diagnosis Date   Adenomatous colon polyp 03/2005   Anxiety    CAD S/P percutaneous coronary angioplasty 2008   PCI to AVG Cx with a Promus DES 2.5 mm x 8 mm   Cataract    BILATERAL,REMOVED   Degenerative joint disease    Diabetes mellitus without complication (HCC)    diet controlled,PT.DENIES UPDATED 04/10/22   Diverticulosis    Fatty liver 2016   pt denies   Fibromyalgia    GERD (gastroesophageal reflux disease)    Heart murmur    Phreesia 07/29/2020   Hyperlipidemia    statin intolerant   Hypertension    Internal hemorrhoids    Obesity, Class II, BMI 35-39.9    Pneumonia 2016   PONV (postoperative nausea and vomiting)     Past Surgical History:  Procedure Laterality Date   CHOLECYSTECTOMY N/A 02/18/2013   Procedure: LAPAROSCOPIC CHOLECYSTECTOMY WITH INTRAOPERATIVE CHOLANGIOGRAM;  Surgeon: Donnice KATHEE Lunger, MD;  Location: [facility];  Service: General;  Laterality: N/A;    COLONOSCOPY     COLONOSCOPY  05/27/2022   CORONARY ANGIOPLASTY WITH STENT PLACEMENT  04/30/2007   AVG Cx 95% -> DES PCI with Promus DES 2.5 mm x 8 mm;had 60-70% lesion to RCA   CORONARY ARTERY BYPASS GRAFT N/A 01/09/2024   Procedure: CORONARY ARTERY BYPASS GRAFTING (CABG) TIMES THREE UTILIZING LEFT INTERNAL MAMMARY ARTERY, ENDOSCOPIC VEIN HARVEST RIGHT GREATER SAPHENOUS VEIN;  Surgeon: Kerrin Elspeth BROCKS, MD;  Location: [facility] OR;  Service: Open Heart Surgery;  Laterality: N/A;   DOPPLER ECHOCARDIOGRAPHY  05/27/2002   [facility]normal EF 55-66%,   FOOT SURGERY Right    x2 fascia   HEEL SPUR SURGERY Right    HEMORRHOID SURGERY     Holter Monitor  04/07/2007   sinus tachy.;   JOINT REPLACEMENT N/A    Phreesia 07/29/2020   KNEE ARTHROSCOPY  03/27/2012  Procedure: ARTHROSCOPY KNEE;  Surgeon: Dempsey LULLA Moan, MD;  Location: [facility];  Service: Orthopedics;  Laterality: Left;   NM MYOCAR PERF WALL MOTION  12/19/2011   EXERCISED FOR 8-1/2 MINUTES RECHING 10 METABOLIC EQUIVALENTS. NO EVIDENCE  OF ISCHEMIA OR INFARCTION . EF 71%   POLYPECTOMY     renal dopplers  04/08/2007   relatively normal   RIGHT/LEFT HEART CATH AND CORONARY ANGIOGRAPHY N/A 01/06/2024   Procedure: RIGHT/LEFT HEART CATH AND CORONARY ANGIOGRAPHY;  Surgeon: Anner Alm ORN, MD;  Location: [facility] INVASIVE CV LAB;  Service: Cardiovascular;  Laterality: N/A;   TEE WITHOUT CARDIOVERSION N/A 01/09/2024   Procedure: ECHOCARDIOGRAM, TRANSESOPHAGEAL;  Surgeon: Kerrin Elspeth BROCKS, MD;  Location: [facility] OR;  Service: Open Heart Surgery;  Laterality: N/A;   TOTAL HIP ARTHROPLASTY Right 03/16/2018   Procedure: RIGHT TOTAL HIP ARTHROPLASTY ANTERIOR APPROACH;  Surgeon: Moan Dempsey, MD;  Location: [facility];  Service: Orthopedics;  Laterality: Right;   TOTAL HIP ARTHROPLASTY Left about 10 to 12 years ago   TOTAL KNEE ARTHROPLASTY Left 09/04/2012   Procedure: TOTAL KNEE ARTHROPLASTY;  Surgeon: Dempsey LULLA Moan, MD;  Location: [facility];  Service:  Orthopedics;  Laterality: Left;   TOTAL KNEE ARTHROPLASTY Right 01/24/2020   Procedure: TOTAL KNEE ARTHROPLASTY;  Surgeon: Moan Dempsey, MD;  Location: [facility];  Service: Orthopedics;  Laterality: Right;     Social History   Socioeconomic History   Marital status: Married    Spouse name: Macario   Number of children: 1   Years of education: Not on file   Highest education level: 12th grade  Occupational History    Employer: [employer]   Occupation: RETIRED  Tobacco Use   Smoking status: Never    Passive exposure: Past (PARENTS SMOKED)   Smokeless tobacco: Never  Vaping Use   Vaping status: Never Used  Substance and Sexual Activity   Alcohol use: No   Drug use: No   Sexual activity: Not on file  Other Topics Concern   Not on file  Social History Narrative   Very little 0-2 drinks a week      Lives with wife and 2 dogs   Social Drivers of Corporate investment banker Strain: Low Risk  (02/17/2024)   Overall Financial Resource Strain (CARDIA)    Difficulty of Paying Living Expenses: Not hard at all  Food Insecurity: No Food Insecurity (02/17/2024)   Hunger Vital Sign    Worried About Running Out of Food in the Last Year: Never true    Ran Out of Food in the Last Year: Never true  Transportation Needs: No Transportation Needs (02/17/2024)   PRAPARE - Administrator, Civil Service (Medical): No    Lack of Transportation (Non-Medical): No  Physical Activity: Inactive (02/17/2024)   Exercise Vital Sign    Days of Exercise per Week: 0 days    Minutes of Exercise per Session: Not on file  Stress: No Stress Concern Present (02/17/2024)   [employer] of Occupational Health - Occupational Stress Questionnaire    Feeling of Stress: Only a little  Social Connections: Socially Integrated (02/17/2024)   Social Connection and Isolation Panel    Frequency of Communication with Friends and Family: More than three times a week    Frequency of Social Gatherings  with Friends and Family: More than three times a week    Attends Religious Services: More than 4 times per year    Active Member of [employer] or Organizations: Yes  Attends Banker Meetings: More than 4 times per year    Marital Status: Married  Catering manager Violence: Not At Risk (01/15/2024)   Humiliation, Afraid, Rape, and Kick questionnaire    Fear of Current or Ex-Partner: No    Emotionally Abused: No    Physically Abused: No    Sexually Abused: No    Family History  Problem Relation Age of Onset   Breast cancer Mother    Diabetes Mother    Prostate cancer Father    Colon cancer Neg Hx    Colon polyps Neg Hx    Esophageal cancer Neg Hx    Rectal cancer Neg Hx    Stomach cancer Neg Hx    Ulcerative colitis Neg Hx    Crohn's disease Neg Hx      Review of Systems  Constitutional: Negative.  Negative for chills and fever.  HENT: Negative.  Negative for congestion and sore throat.   Respiratory:  Positive for cough and sputum production. Negative for shortness of breath.   Cardiovascular: Negative.  Negative for chest pain and palpitations.  Gastrointestinal:  Negative for abdominal pain, diarrhea, nausea and vomiting.  Genitourinary: Negative.  Negative for dysuria and hematuria.  Skin: Negative.  Negative for rash.  Neurological: Negative.  Negative for dizziness and headaches.  All other systems reviewed and are negative.   Vitals:   02/18/24 1506  BP: 128/70  Pulse: 96  Temp: 98.2 F (36.8 C)  SpO2: 97%    Physical Exam Vitals reviewed.  Constitutional:      Appearance: Normal appearance.  HENT:     Head: Normocephalic.     Mouth/Throat:     Mouth: Mucous membranes are moist.     Pharynx: Oropharynx is clear.  Eyes:     Extraocular Movements: Extraocular movements intact.     Pupils: Pupils are equal, round, and reactive to light.  Cardiovascular:     Rate and Rhythm: Normal rate and regular rhythm.     Pulses: Normal pulses.     Heart  sounds: Normal heart sounds.  Pulmonary:     Effort: Pulmonary effort is normal.     Breath sounds: Normal breath sounds.  Musculoskeletal:     Cervical back: No tenderness.  Lymphadenopathy:     Cervical: No cervical adenopathy.  Skin:    General: Skin is warm and dry.     Capillary Refill: Capillary refill takes less than 2 seconds.  Neurological:     General: No focal deficit present.     Mental Status: He is alert and oriented to person, place, and time.  Psychiatric:        Mood and Affect: Mood normal.        Behavior: Behavior normal.      ASSESSMENT & PLAN: A total of 35 minutes was spent with the patient and counseling/coordination of care regarding preparing for this visit, review of most recent office visit notes, review of most recent cardiothoracic office visit notes, review of multiple chronic medical conditions under management, review of all medications, diagnosis of lower respiratory infection and need for antibiotics, symptom and cough management, prognosis, ED precautions, documentation and need for follow-up.  Problem List Items Addressed This Visit       Respiratory   Lower respiratory infection - Primary   Clinically stable.  No red flag signs or symptoms. Persistent cough. No signs of pneumonia today Chest x-ray from 02/12/2024 reviewed.  No pneumonia or fluid collections. Non-smoker. Differential diagnosis  discussed.  Acute bronchitis most likely. Most likely viral but may still have secondary bacterial infection.  Recommend Augmentin  875 mg twice a day for 7 days ED precautions given.      Relevant Medications   amoxicillin -clavulanate (AUGMENTIN ) 875-125 MG tablet     Other   Persistent cough   Cough management discussed. Recommend to continue over-the-counter Mucinex  DM and cough drops Restart Tessalon  200 mg 3 times a day and Hycodan syrup at bedtime as needed Recommend to stay well-hydrated and rest      Relevant Medications   benzonatate   (TESSALON ) 200 MG capsule   HYDROcodone  bit-homatropine (HYCODAN) 5-1.5 MG/5ML syrup   Patient Instructions  Acute Bronchitis, Adult  Acute bronchitis is when air tubes in the lungs (bronchi) suddenly get swollen. The condition can make it hard for you to breathe. In adults, acute bronchitis usually goes away within 2 weeks. A cough caused by bronchitis may last up to 3 weeks. Smoking, allergies, and asthma can make the condition worse. What are the causes? Germs that cause cold and flu (viruses). The most common cause of this condition is the virus that causes the common cold. Bacteria. Substances that bother (irritate) the lungs, including: Smoke from cigarettes and other types of tobacco. Dust and pollen. Fumes from chemicals, gases, or burned fuel. Indoor or outdoor air pollution. What increases the risk? A weak body's defense system. This is also called the immune system. Any condition that affects your lungs and breathing, such as asthma. What are the signs or symptoms? A cough. Coughing up clear, yellow, or green mucus. Making high-pitched whistling sounds when you breathe, most often when you breathe out (wheezing). Runny or stuffy nose. Having too much mucus in your lungs (chest congestion). Shortness of breath. Body aches. A sore throat. How is this treated? Acute bronchitis may go away over time without treatment. Your doctor may tell you to: Drink more fluids. This will help thin your mucus so it is easier to cough up. Use a device that gets medicine into your lungs (inhaler). Use a vaporizer or a humidifier. These are machines that add water  to the air. This helps with coughing and poor breathing. Take a medicine that thins mucus and helps clear it from your lungs. Take a medicine that prevents or stops coughing. It is not common to take an antibiotic medicine for this condition. Follow these instructions at home:  Take over-the-counter and prescription medicines  only as told by your doctor. Use an inhaler, vaporizer, or humidifier as told by your doctor. Take two teaspoons (10 mL) of honey at bedtime. This helps lessen your coughing at night. Drink enough fluid to keep your pee (urine) pale yellow. Do not smoke or use any products that contain nicotine or tobacco. If you need help quitting, ask your doctor. Get a lot of rest. Return to your normal activities when your doctor says that it is safe. Keep all follow-up visits. How is this prevented?  Wash your hands often with soap and water  for at least 20 seconds. If you cannot use soap and water , use hand sanitizer. Avoid contact with people who have cold symptoms. Try not to touch your mouth, nose, or eyes with your hands. Avoid breathing in smoke or chemical fumes. Make sure to get the flu shot every year. Contact a doctor if: Your symptoms do not get better in 2 weeks. You have trouble coughing up the mucus. Your cough keeps you awake at night. You have a fever. Get  help right away if: You cough up blood. You have chest pain. You have very bad shortness of breath. You faint or keep feeling like you are going to faint. You have a very bad headache. Your fever or chills get worse. These symptoms may be an emergency. Get help right away. Call your local emergency services (911 in the [place] Do not wait to see if the symptoms will go away. Do not drive yourself to the hospital. Summary Acute bronchitis is when air tubes in the lungs (bronchi) suddenly get swollen. In adults, acute bronchitis usually goes away within 2 weeks. Drink more fluids. This will help thin your mucus so it is easier to cough up. Take over-the-counter and prescription medicines only as told by your doctor. Contact a doctor if your symptoms do not improve after 2 weeks of treatment. This information is not intended to replace advice given to you by your health care provider. Make sure you discuss any questions you have  with your health care provider. Document Revised: 11/15/2020 Document Reviewed: 11/15/2020 Elsevier Patient Education  2024 Elsevier Inc.     Emil Schaumann, MD [facility]

## 2024-02-18 NOTE — Assessment & Plan Note (Signed)
 Clinically stable.  No red flag signs or symptoms. Persistent cough. No signs of pneumonia today Chest x-ray from 02/12/2024 reviewed.  No pneumonia or fluid collections. Non-smoker. Differential diagnosis discussed.  Acute bronchitis most likely. Most likely viral but may still have secondary bacterial infection.  Recommend Augmentin  875 mg twice a day for 7 days ED precautions given.

## 2024-02-18 NOTE — Assessment & Plan Note (Signed)
 Cough management discussed. Recommend to continue over-the-counter Mucinex DM and cough drops Restart Tessalon 200 mg 3 times a day and Hycodan syrup at bedtime as needed Recommend to stay well-hydrated and rest

## 2024-02-19 ENCOUNTER — Telehealth (HOSPITAL_COMMUNITY): Payer: Self-pay

## 2024-02-19 NOTE — Telephone Encounter (Signed)
 Pt insurance is active and benefits verified through Medicare B. Co-pay $0, DED $257/$257 met, out of pocket $0/$0 met, co-insurance 20%. No pre-authorization required. Passport, 02/19/2024 @ 12:02pm, REF# [id number].  How many CR sessions are covered? (36 visits for TCR, 72 visits for ICR)72 ICR Is this a lifetime maximum or an annual maximum? Lifetime Has the member used any of these services to date? No Is there a time limit (weeks/months) on start of program and/or program completion? No   2ndary insurance is active and benefits verified through [facility]. Co-pay $0, DED $0/$0 met, out of pocket $0/$0 met, co-insurance 0%. No pre-authorization required.

## 2024-02-20 ENCOUNTER — Telehealth (HOSPITAL_COMMUNITY): Payer: Self-pay

## 2024-02-20 NOTE — Telephone Encounter (Signed)
 Called patient to see if he was interested in participating in the Cardiac Rehab Program. Patient will come in for orientation on 8/14 and will attend the 10:15 exercise class.  Sent MyChart message.

## 2024-03-09 ENCOUNTER — Ambulatory Visit: Attending: Cardiology

## 2024-03-09 ENCOUNTER — Telehealth (HOSPITAL_COMMUNITY): Payer: Self-pay

## 2024-03-09 DIAGNOSIS — R059 Cough, unspecified: Secondary | ICD-10-CM

## 2024-03-09 DIAGNOSIS — R0602 Shortness of breath: Secondary | ICD-10-CM | POA: Diagnosis not present

## 2024-03-09 DIAGNOSIS — I480 Paroxysmal atrial fibrillation: Secondary | ICD-10-CM

## 2024-03-09 NOTE — Telephone Encounter (Signed)
 Patient called back stating he has a dermatology appt on 8/20 and will need to start his 1st day of 10:15 CR class on 8/22.

## 2024-03-09 NOTE — Telephone Encounter (Signed)
 Patient left message calling out for class on 8/18, will have his 1st day of 10:15 CR class on 8/20.

## 2024-03-09 NOTE — Telephone Encounter (Signed)
 Thanks for the update

## 2024-03-11 ENCOUNTER — Encounter (HOSPITAL_COMMUNITY)
Admission: RE | Admit: 2024-03-11 | Discharge: 2024-03-11 | Disposition: A | Discharge status: Home / Self Care | Source: Ambulatory Visit | Attending: Cardiovascular Disease | Admitting: Cardiovascular Disease

## 2024-03-11 VITALS — BP 160/88 | HR 76 | Ht 71.5 in | Wt 268.5 lb

## 2024-03-11 DIAGNOSIS — Z951 Presence of aortocoronary bypass graft: Secondary | ICD-10-CM | POA: Insufficient documentation

## 2024-03-11 NOTE — Progress Notes (Signed)
 Cardiac Rehab Medication Review by a Nurse   Does the patient  feel that his/her medications are working for him/her?  yes   Has the patient been experiencing any side effects to the medications prescribed?  no   Does the patient measure his/her own blood pressure or blood glucose at home?  yes    Does the patient have any problems obtaining medications due to transportation or finances?   no   Understanding of regimen: excellent Understanding of indications: excellent Potential of compliance: excellent     Nurse comments: Pt stated I do not have any medication questions at this time.

## 2024-03-11 NOTE — Progress Notes (Signed)
 Cardiac Individual Treatment Plan  Patient Details  Name: Samuel Chambers MRN: [id number] Date of Birth: 1951-07-05 Referring Provider:   Flowsheet Row INTENSIVE CARDIAC REHAB ORIENT from 03/11/2024 in [facility]  Referring Provider Jerel Balding, MD    Initial Encounter Date:  Flowsheet Row INTENSIVE CARDIAC REHAB ORIENT from 03/11/2024 in [facility]  Date 03/11/24    Visit Diagnosis: 01/09/24 CABG x 3  Patient's Home Medications on Admission:  Current Outpatient Medications:    aspirin  EC 325 MG tablet, Take 1 tablet (325 mg total) by mouth daily., Disp: , Rfl:    benzonatate  (TESSALON ) 200 MG capsule, Take 1 capsule (200 mg total) by mouth 2 (two) times daily as needed for cough., Disp: 20 capsule, Rfl: 0   DULoxetine  (CYMBALTA ) 60 MG capsule, TAKE 1 CAPSULE BY MOUTH EVERY DAY, Disp: 90 capsule, Rfl: 1   hydrochlorothiazide  (HYDRODIURIL ) 25 MG tablet, Take 1 tablet (25 mg total) by mouth daily., Disp: 90 tablet, Rfl: 3   losartan  (COZAAR ) 100 MG tablet, Take 1 tablet (100 mg total) by mouth daily., Disp: 90 tablet, Rfl: 3   metoprolol  succinate (TOPROL -XL) 50 MG 24 hr tablet, Take 1 tablet (50 mg total) by mouth daily., Disp: 90 tablet, Rfl: 3   acetaminophen  (TYLENOL ) 500 MG tablet, Take 1,000 mg by mouth every 6 (six) hours as needed for moderate pain (pain score 4-6)., Disp: , Rfl:    HYDROcodone  bit-homatropine (HYCODAN) 5-1.5 MG/5ML syrup, Take 5 mLs by mouth every 6 (six) hours as needed for cough. (Patient not taking: Reported on 03/11/2024), Disp: 240 mL, Rfl: 0  Past Medical History: Past Medical History:  Diagnosis Date   Adenomatous colon polyp 03/2005   Anxiety    CAD S/P percutaneous coronary angioplasty 2008   PCI to AVG Cx with a Promus DES 2.5 mm x 8 mm   Cataract    BILATERAL,REMOVED   Degenerative joint disease    Diabetes mellitus without  complication (HCC)    diet controlled,PT.DENIES UPDATED 04/10/22   Diverticulosis    Fatty liver 2016   pt denies   Fibromyalgia    GERD (gastroesophageal reflux disease)    Heart murmur    [employer]rees[employer] 07/29/2020   Hyperlipidemia    statin intolerant   Hypertension    Internal hemorrhoids    Obesity, Class II, BMI 35-39.9    Pneumonia 2016   PONV (postoperative nausea and vomiting)     Tobacco Use: Social History   Tobacco Use  Smoking Status Never   Passive exposure: Past (PARENTS SMOKED)  Smokeless Tobacco Never    Labs: Review Flowsheet  More data exists      Latest Ref Rng & Units 02/10/2023 02/11/2023 01/06/2024 01/07/2024 01/09/2024  Labs for ITP Cardiac and Pulmonary Rehab  Cholestrol 0 - 200 mg/dL - 827  - - -  LDL (calc) 0 - 99 mg/dL - 894  - - -  HDL-C >59 mg/dL - 32  - - -  Trlycerides <150 mg/dL - 826  - - -  Hemoglobin A1c 4.8 - 5.6 % 6.2  - - 5.9  -  PH, Arterial 7.35 - 7.45 - - 7.358  - 7.305  7.358  7.379  7.351  7.331   PCO2 arterial 32 - 48 mmHg - - 37.2  - 43.0  39.7  36.6  44.6  41.1   Bicarbonate 20.0 - 28.0 mmol/L - -  23.6  23.6  20.9  - 21.5  22.5  21.8  24.6  24.5  21.7   TCO2 22 - 32 mmol/L - - 25  25  22   - 23  24  23  25  26  28  26  23  24  24    Acid-base deficit 0.0 - 2.0 mmol/L - - 3.0  3.0  4.0  - 5.0  3.0  3.0  1.0  2.0  4.0   O2 Saturation % - - 67  69  96  - 94  96  95  100  77  100     Details       Multiple values from one day are sorted in reverse-chronological order         Capillary Blood Glucose: Lab Results  Component Value Date   GLUCAP 104 (H) 01/14/2024   GLUCAP 127 (H) 01/13/2024   GLUCAP 115 (H) 01/13/2024   GLUCAP 100 (H) 01/13/2024   GLUCAP 123 (H) 01/12/2024     Exercise Target Goals: Exercise Program Goal: Individual exercise prescription set using results from initial 6 min walk test and THRR while considering  patient's activity barriers and safety.   Exercise Prescription Goal: Initial exercise  prescription builds to 30-45 minutes a day of aerobic activity, 2-3 days per week.  Home exercise guidelines will be given to patient during program as part of exercise prescription that the participant will acknowledge.  Activity Barriers & Risk Stratification:  Activity Barriers & Cardiac Risk Stratification - 03/11/24 1325       Activity Barriers & Cardiac Risk Stratification   Activity Barriers Arthritis;Fibromyalgia;Left Hip Replacement;Right Hip Replacement;Left Knee Replacement;Right Knee Replacement;Joint Problems;Deconditioning;Balance Concerns    Cardiac Risk Stratification High   <5 METs on         6 Minute Walk:  6 Minute Walk     Row Name 03/11/24 1324         6 Minute Walk   Phase Initial     Distance 1440 feet     Walk Time 6 minutes     # of Rest Breaks 0     MPH 2.73     METS 3.03     RPE 13     Perceived Dyspnea  0     VO2 Peak 10.6     Symptoms Yes (comment)     Comments 7/10 bilateral feet pain-chronic     Resting HR 76 bpm     Resting BP 160/88     Resting Oxygen Saturation  98 %     Exercise Oxygen Saturation  during 6 min walk 99 %     Max Ex. HR 120 bpm     Max Ex. BP 176/78     2 Minute Post BP 140/76        Oxygen Initial Assessment:   Oxygen Re-Evaluation:   Oxygen Discharge (Final Oxygen Re-Evaluation):   Initial Exercise Prescription:  Initial Exercise Prescription - 03/11/24 1300       Date of Initial Exercise RX and Referring Provider   Date 03/11/24    Referring Provider Jerel Balding, MD    Expected Discharge Date 06/02/24      Recumbant Bike   Level 1    RPM 50    Watts 20    Minutes 15    METs 2.2      NuStep   Level 1    SPM 70    Minutes 15  METs 2      Prescription Details   Frequency (times per week) 3    Duration Progress to 30 minutes of continuous aerobic without signs/symptoms of physical distress      Intensity   THRR 40-80% of Max Heartrate 59-118    Ratings of Perceived Exertion 11-13     Perceived Dyspnea 0-4      Progression   Progression Continue progressive overload as per policy without signs/symptoms or physical distress.      Resistance Training   Training Prescription Yes    Weight 2    Reps 10-15          Perform Capillary Blood Glucose checks as needed.  Exercise Prescription Changes:   Exercise Comments:   Exercise Goals and Review:   Exercise Goals     Row Name 03/11/24 1335             Exercise Goals   Increase Physical Activity Yes       Intervention Provide advice, education, support and counseling about physical activity/exercise needs.;Develop an individualized exercise prescription for aerobic and resistive training based on initial evaluation findings, risk stratification, comorbidities and participant's personal goals.       Expected Outcomes Short Term: Attend rehab on a regular basis to increase amount of physical activity.;Long Term: Exercising regularly at least 3-5 days a week.;Long Term: Add in home exercise to make exercise part of routine and to increase amount of physical activity.       Increase Strength and Stamina Yes       Intervention Provide advice, education, support and counseling about physical activity/exercise needs.;Develop an individualized exercise prescription for aerobic and resistive training based on initial evaluation findings, risk stratification, comorbidities and participant's personal goals.       Expected Outcomes Short Term: Increase workloads from initial exercise prescription for resistance, speed, and METs.;Short Term: Perform resistance training exercises routinely during rehab and add in resistance training at home;Long Term: Improve cardiorespiratory fitness, muscular endurance and strength as measured by increased METs and functional capacity ( )       Able to understand and use rate of perceived exertion (RPE) scale Yes       Intervention Provide education and explanation on how to use RPE  scale       Expected Outcomes Short Term: Able to use RPE daily in rehab to express subjective intensity level;Long Term:  Able to use RPE to guide intensity level when exercising independently       Knowledge and understanding of Target Heart Rate Range (THRR) Yes       Intervention Provide education and explanation of THRR including how the numbers were predicted and where they are located for reference       Expected Outcomes Long Term: Able to use THRR to govern intensity when exercising independently;Short Term: Able to state/look up THRR;Short Term: Able to use daily as guideline for intensity in rehab       Understanding of Exercise Prescription Yes       Intervention Provide education, explanation, and written materials on patient's individual exercise prescription       Expected Outcomes Short Term: Able to explain program exercise prescription;Long Term: Able to explain home exercise prescription to exercise independently          Exercise Goals Re-Evaluation :   Discharge Exercise Prescription (Final Exercise Prescription Changes):   Nutrition:  Target Goals: Understanding of nutrition guidelines, daily intake of sodium 1500mg , cholesterol 200mg , calories 30%  from fat and 7% or less from saturated fats, daily to have 5 or more servings of fruits and vegetables.  Biometrics:  Pre Biometrics - 03/11/24 1322       Pre Biometrics   Waist Circumference 50 inches    Hip Circumference 44 inches    Waist to Hip Ratio 1.14 %    Triceps Skinfold 14 mm    % Body Fat 35.1 %    Grip Strength 26 kg    Flexibility --   hip replacements   Single Leg Stand 15 seconds           Nutrition Therapy Plan and Nutrition Goals:   Nutrition Assessments:  MEDIFICTS Score Key: >=70 Need to make dietary changes  40-70 Heart Healthy Diet <= 40 Therapeutic Level Cholesterol Diet    Picture Your Plate Scores: <59 Unhealthy dietary pattern with much room for improvement. 41-50 Dietary  pattern unlikely to meet recommendations for good health and room for improvement. 51-60 More healthful dietary pattern, with some room for improvement.  >60 Healthy dietary pattern, although there may be some specific behaviors that could be improved.    Nutrition Goals Re-Evaluation:   Nutrition Goals Re-Evaluation:   Nutrition Goals Discharge (Final Nutrition Goals Re-Evaluation):   Psychosocial: Target Goals: Acknowledge presence or absence of significant depression and/or stress, maximize coping skills, provide positive support system. Participant is able to verbalize types and ability to use techniques and skills needed for reducing stress and depression.  Initial Review & Psychosocial Screening:  Initial Psych Review & Screening - 03/11/24 1336       Initial Review   Current issues with Current Depression;Current Anxiety/Panic;Current Stress Concerns    Source of Stress Concerns Chronic Illness    Comments Ubaldo shared that he has some low levels of depression, high levels of anxiety, and medium levels of stress. All related to his current health. He is taking Cymbalta .      Family Dynamics   Good Support System? Yes   wife     Barriers   Psychosocial barriers to participate in program The patient should benefit from training in stress management and relaxation.      Screening Interventions   Interventions Provide feedback about the scores to participant;To provide support and resources with identified psychosocial needs;Encouraged to exercise    Expected Outcomes Long Term Goal: Stressors or current issues are controlled or eliminated.;Short Term goal: Identification and review with participant of any Quality of Life or Depression concerns found by scoring the questionnaire.;Long Term goal: The participant improves quality of Life and PHQ9 Scores as seen by post scores and/or verbalization of changes          Quality of Life Scores:  Quality of Life - 03/11/24 1337        Quality of Life   Select Quality of Life      Quality of Life Scores   Health/Function Pre 24.8 %    Socioeconomic Pre 30 %    Psych/Spiritual Pre 30 %    Family Pre 30 %    GLOBAL Pre 27.56 %         Scores of 19 and below usually indicate a poorer quality of life in these areas.  A difference of  2-3 points is a clinically meaningful difference.  A difference of 2-3 points in the total score of the Quality of Life Index has been associated with significant improvement in overall quality of life, self-image, physical symptoms, and general health in  studies assessing change in quality of life.  PHQ-9: Review Flowsheet  More data exists      03/11/2024 02/18/2024 01/15/2024 11/19/2023 07/08/2023  Depression screen PHQ 2/9  Decreased Interest 0 0 0 0 0  Down, Depressed, Hopeless 1 0 0 0 1  PHQ - 2 Score 1 0 0 0 1  Altered sleeping 0 0 - 0 -  Tired, decreased energy 1 0 - 0 -  Change in appetite 1 0 - 1 -  Feeling bad or failure about yourself  1 0 - 0 -  Trouble concentrating 0 0 - 0 -  Moving slowly or fidgety/restless 0 0 - 0 -  Suicidal thoughts 0 0 - 0 -  PHQ-9 Score 4 0 - 1 -  Difficult doing work/chores Not difficult at all Not difficult at all - Not difficult at all -   Interpretation of Total Score  Total Score Depression Severity:  1-4 = Minimal depression, 5-9 = Mild depression, 10-14 = Moderate depression, 15-19 = Moderately severe depression, 20-27 = Severe depression   Psychosocial Evaluation and Intervention:   Psychosocial Re-Evaluation:   Psychosocial Discharge (Final Psychosocial Re-Evaluation):   Vocational Rehabilitation: Provide vocational rehab assistance to qualifying candidates.   Vocational Rehab Evaluation & Intervention:  Vocational Rehab - 03/11/24 1338       Initial Vocational Rehab Evaluation & Intervention   Assessment shows need for Vocational Rehabilitation No   retired         Education: Education Goals: Education  classes will be provided on a weekly basis, covering required topics. Participant will state understanding/return demonstration of topics presented.     Core Videos: Exercise    Move It!  Clinical staff conducted group or individual video education with verbal and written material and guidebook.  Patient learns the recommended Pritikin exercise program. Exercise with the goal of living a long, healthy life. Some of the health benefits of exercise include controlled diabetes, healthier blood pressure levels, improved cholesterol levels, improved heart and lung capacity, improved sleep, and better body composition. Everyone should speak with their doctor before starting or changing an exercise routine.  Biomechanical Limitations Clinical staff conducted group or individual video education with verbal and written material and guidebook.  Patient learns how biomechanical limitations can impact exercise and how we can mitigate and possibly overcome limitations to have an impactful and balanced exercise routine.  Body Composition Clinical staff conducted group or individual video education with verbal and written material and guidebook.  Patient learns that body composition (ratio of muscle mass to fat mass) is a key component to assessing overall fitness, rather than body weight alone. Increased fat mass, especially visceral belly fat, can put us  at increased risk for metabolic syndrome, type 2 diabetes, heart disease, and even death. It is recommended to combine diet and exercise (cardiovascular and resistance training) to improve your body composition. Seek guidance from your physician and exercise physiologist before implementing an exercise routine.  Exercise Action Plan Clinical staff conducted group or individual video education with verbal and written material and guidebook.  Patient learns the recommended strategies to achieve and enjoy long-term exercise adherence, including variety,  self-motivation, self-efficacy, and positive decision making. Benefits of exercise include fitness, good health, weight management, more energy, better sleep, less stress, and overall well-being.  Medical   Heart Disease Risk Reduction Clinical staff conducted group or individual video education with verbal and written material and guidebook.  Patient learns our heart is our most vital organ as  it circulates oxygen, nutrients, white blood cells, and hormones throughout the entire body, and carries waste away. Data supports a plant-based eating plan like the Pritikin Program for its effectiveness in slowing progression of and reversing heart disease. The video provides a number of recommendations to address heart disease.   Metabolic Syndrome and Belly Fat  Clinical staff conducted group or individual video education with verbal and written material and guidebook.  Patient learns what metabolic syndrome is, how it leads to heart disease, and how one can reverse it and keep it from coming back. You have metabolic syndrome if you have 3 of the following 5 criteria: abdominal obesity, high blood pressure, high triglycerides, low HDL cholesterol, and high blood sugar.  Hypertension and Heart Disease Clinical staff conducted group or individual video education with verbal and written material and guidebook.  Patient learns that high blood pressure, or hypertension, is very common in the [place] . Hypertension is largely due to excessive salt intake, but other important risk factors include being overweight, physical inactivity, drinking too much alcohol, smoking, and not eating enough potassium from fruits and vegetables. High blood pressure is a leading risk factor for heart attack, stroke, congestive heart failure, dementia, kidney failure, and premature death. Long-term effects of excessive salt intake include stiffening of the arteries and thickening of heart muscle and organ damage. Recommendations  include ways to reduce hypertension and the risk of heart disease.  Diseases of Our Time - Focusing on Diabetes Clinical staff conducted group or individual video education with verbal and written material and guidebook.  Patient learns why the best way to stop diseases of our time is prevention, through food and other lifestyle changes. Medicine (such as prescription pills and surgeries) is often only a Band-Aid on the problem, not a long-term solution. Most common diseases of our time include obesity, type 2 diabetes, hypertension, heart disease, and cancer. The Pritikin Program is recommended and has been proven to help reduce, reverse, and/or prevent the damaging effects of metabolic syndrome.  Nutrition   Overview of the Pritikin Eating Plan  Clinical staff conducted group or individual video education with verbal and written material and guidebook.  Patient learns about the Pritikin Eating Plan for disease risk reduction. The Pritikin Eating Plan emphasizes a wide variety of unrefined, minimally-processed carbohydrates, like fruits, vegetables, whole grains, and legumes. Go, Caution, and Stop food choices are explained. Plant-based and lean animal proteins are emphasized. Rationale provided for low sodium intake for blood pressure control, low added sugars for blood sugar stabilization, and low added fats and oils for coronary artery disease risk reduction and weight management.  Calorie Density  Clinical staff conducted group or individual video education with verbal and written material and guidebook.  Patient learns about calorie density and how it impacts the Pritikin Eating Plan. Knowing the characteristics of the food you choose will help you decide whether those foods will lead to weight gain or weight loss, and whether you want to consume more or less of them. Weight loss is usually a side effect of the Pritikin Eating Plan because of its focus on low calorie-dense foods.  Label Reading   Clinical staff conducted group or individual video education with verbal and written material and guidebook.  Patient learns about the Pritikin recommended label reading guidelines and corresponding recommendations regarding calorie density, added sugars, sodium content, and whole grains.  Dining Out - Part 1  Clinical staff conducted group or individual video education with verbal and written material  and guidebook.  Patient learns that restaurant meals can be sabotaging because they can be so high in calories, fat, sodium, and/or sugar. Patient learns recommended strategies on how to positively address this and avoid unhealthy pitfalls.  Facts on Fats  Clinical staff conducted group or individual video education with verbal and written material and guidebook.  Patient learns that lifestyle modifications can be just as effective, if not more so, as many medications for lowering your risk of heart disease. A Pritikin lifestyle can help to reduce your risk of inflammation and atherosclerosis (cholesterol build-up, or plaque, in the artery walls). Lifestyle interventions such as dietary choices and physical activity address the cause of atherosclerosis. A review of the types of fats and their impact on blood cholesterol levels, along with dietary recommendations to reduce fat intake is also included.  Nutrition Action Plan  Clinical staff conducted group or individual video education with verbal and written material and guidebook.  Patient learns how to incorporate Pritikin recommendations into their lifestyle. Recommendations include planning and keeping personal health goals in mind as an important part of their success.  Healthy Mind-Set    Healthy Minds, Bodies, Hearts  Clinical staff conducted group or individual video education with verbal and written material and guidebook.  Patient learns how to identify when they are stressed. Video will discuss the impact of that stress, as well as the  many benefits of stress management. Patient will also be introduced to stress management techniques. The way we think, act, and feel has an impact on our hearts.  How Our Thoughts Can Heal Our Hearts  Clinical staff conducted group or individual video education with verbal and written material and guidebook.  Patient learns that negative thoughts can cause depression and anxiety. This can result in negative lifestyle behavior and serious health problems. Cognitive behavioral therapy is an effective method to help control our thoughts in order to change and improve our emotional outlook.  Additional Videos:  Exercise    Improving Performance  Clinical staff conducted group or individual video education with verbal and written material and guidebook.  Patient learns to use a non-linear approach by alternating intensity levels and lengths of time spent exercising to help burn more calories and lose more body fat. Cardiovascular exercise helps improve heart health, metabolism, hormonal balance, blood sugar control, and recovery from fatigue. Resistance training improves strength, endurance, balance, coordination, reaction time, metabolism, and muscle mass. Flexibility exercise improves circulation, posture, and balance. Seek guidance from your physician and exercise physiologist before implementing an exercise routine and learn your capabilities and proper form for all exercise.  Introduction to Yoga  Clinical staff conducted group or individual video education with verbal and written material and guidebook.  Patient learns about yoga, a discipline of the coming together of mind, breath, and body. The benefits of yoga include improved flexibility, improved range of motion, better posture and core strength, increased lung function, weight loss, and positive self-image. Yoga's heart health benefits include lowered blood pressure, healthier heart rate, decreased cholesterol and triglyceride levels, improved  immune function, and reduced stress. Seek guidance from your physician and exercise physiologist before implementing an exercise routine and learn your capabilities and proper form for all exercise.  Medical   Aging: Enhancing Your Quality of Life  Clinical staff conducted group or individual video education with verbal and written material and guidebook.  Patient learns key strategies and recommendations to stay in good physical health and enhance quality of life, such as prevention strategies,  having an advocate, securing a Health Care Proxy and Power of Attorney, and keeping a list of medications and system for tracking them. It also discusses how to avoid risk for bone loss.  Biology of Weight Control  Clinical staff conducted group or individual video education with verbal and written material and guidebook.  Patient learns that weight gain occurs because we consume more calories than we burn (eating more, moving less). Even if your body weight is normal, you may have higher ratios of fat compared to muscle mass. Too much body fat puts you at increased risk for cardiovascular disease, heart attack, stroke, type 2 diabetes, and obesity-related cancers. In addition to exercise, following the Pritikin Eating Plan can help reduce your risk.  Decoding Lab Results  Clinical staff conducted group or individual video education with verbal and written material and guidebook.  Patient learns that lab test reflects one measurement whose values change over time and are influenced by many factors, including medication, stress, sleep, exercise, food, hydration, pre-existing medical conditions, and more. It is recommended to use the knowledge from this video to become more involved with your lab results and evaluate your numbers to speak with your doctor.   Diseases of Our Time - Overview  Clinical staff conducted group or individual video education with verbal and written material and guidebook.  Patient  learns that according to the CDC, 50% to 70% of chronic diseases (such as obesity, type 2 diabetes, elevated lipids, hypertension, and heart disease) are avoidable through lifestyle improvements including healthier food choices, listening to satiety cues, and increased physical activity.  Sleep Disorders Clinical staff conducted group or individual video education with verbal and written material and guidebook.  Patient learns how good quality and duration of sleep are important to overall health and well-being. Patient also learns about sleep disorders and how they impact health along with recommendations to address them, including discussing with a physician.  Nutrition  Dining Out - Part 2 Clinical staff conducted group or individual video education with verbal and written material and guidebook.  Patient learns how to plan ahead and communicate in order to maximize their dining experience in a healthy and nutritious manner. Included are recommended food choices based on the type of restaurant the patient is visiting.   Fueling a Banker conducted group or individual video education with verbal and written material and guidebook.  There is a strong connection between our food choices and our health. Diseases like obesity and type 2 diabetes are very prevalent and are in large-part due to lifestyle choices. The Pritikin Eating Plan provides plenty of food and hunger-curbing satisfaction. It is easy to follow, affordable, and helps reduce health risks.  Menu Workshop  Clinical staff conducted group or individual video education with verbal and written material and guidebook.  Patient learns that restaurant meals can sabotage health goals because they are often packed with calories, fat, sodium, and sugar. Recommendations include strategies to plan ahead and to communicate with the manager, chef, or server to help order a healthier meal.  Planning Your Eating Strategy   Clinical staff conducted group or individual video education with verbal and written material and guidebook.  Patient learns about the Pritikin Eating Plan and its benefit of reducing the risk of disease. The Pritikin Eating Plan does not focus on calories. Instead, it emphasizes high-quality, nutrient-rich foods. By knowing the characteristics of the foods, we choose, we can determine their calorie density and make informed decisions.  Targeting Your Nutrition Priorities  Clinical staff conducted group or individual video education with verbal and written material and guidebook.  Patient learns that lifestyle habits have a tremendous impact on disease risk and progression. This video provides eating and physical activity recommendations based on your personal health goals, such as reducing LDL cholesterol, losing weight, preventing or controlling type 2 diabetes, and reducing high blood pressure.  Vitamins and Minerals  Clinical staff conducted group or individual video education with verbal and written material and guidebook.  Patient learns different ways to obtain key vitamins and minerals, including through a recommended healthy diet. It is important to discuss all supplements you take with your doctor.   Healthy Mind-Set    Smoking Cessation  Clinical staff conducted group or individual video education with verbal and written material and guidebook.  Patient learns that cigarette smoking and tobacco addiction pose a serious health risk which affects millions of people. Stopping smoking will significantly reduce the risk of heart disease, lung disease, and many forms of cancer. Recommended strategies for quitting are covered, including working with your doctor to develop a successful plan.  Culinary   Becoming a Set designer conducted group or individual video education with verbal and written material and guidebook.  Patient learns that cooking at home can be healthy,  cost-effective, quick, and puts them in control. Keys to cooking healthy recipes will include looking at your recipe, assessing your equipment needs, planning ahead, making it simple, choosing cost-effective seasonal ingredients, and limiting the use of added fats, salts, and sugars.  Cooking - Breakfast and Snacks  Clinical staff conducted group or individual video education with verbal and written material and guidebook.  Patient learns how important breakfast is to satiety and nutrition through the entire day. Recommendations include key foods to eat during breakfast to help stabilize blood sugar levels and to prevent overeating at meals later in the day. Planning ahead is also a key component.  Cooking - Educational psychologist conducted group or individual video education with verbal and written material and guidebook.  Patient learns eating strategies to improve overall health, including an approach to cook more at home. Recommendations include thinking of animal protein as a side on your plate rather than center stage and focusing instead on lower calorie dense options like vegetables, fruits, whole grains, and plant-based proteins, such as beans. Making sauces in large quantities to freeze for later and leaving the skin on your vegetables are also recommended to maximize your experience.  Cooking - Healthy Salads and Dressing Clinical staff conducted group or individual video education with verbal and written material and guidebook.  Patient learns that vegetables, fruits, whole grains, and legumes are the foundations of the Pritikin Eating Plan. Recommendations include how to incorporate each of these in flavorful and healthy salads, and how to create homemade salad dressings. Proper handling of ingredients is also covered. Cooking - Soups and [employer] - Soups and Desserts Clinical staff conducted group or individual video education with verbal and written material and  guidebook.  Patient learns that Pritikin soups and desserts make for easy, nutritious, and delicious snacks and meal components that are low in sodium, fat, sugar, and calorie density, while high in vitamins, minerals, and filling fiber. Recommendations include simple and healthy ideas for soups and desserts.   Overview     The Pritikin Solution Program Overview Clinical staff conducted group or individual video education with verbal and written material and  guidebook.  Patient learns that the results of the Pritikin Program have been documented in more than 100 articles published in peer-reviewed journals, and the benefits include reducing risk factors for (and, in some cases, even reversing) high cholesterol, high blood pressure, type 2 diabetes, obesity, and more! An overview of the three key pillars of the Pritikin Program will be covered: eating well, doing regular exercise, and having a healthy mind-set.  WORKSHOPS  Exercise: Exercise Basics: Building Your Action Plan Clinical staff led group instruction and group discussion with PowerPoint presentation and patient guidebook. To enhance the learning environment the use of posters, models and videos may be added. At the conclusion of this workshop, patients will comprehend the difference between physical activity and exercise, as well as the benefits of incorporating both, into their routine. Patients will understand the FITT (Frequency, Intensity, Time, and Type) principle and how to use it to build an exercise action plan. In addition, safety concerns and other considerations for exercise and cardiac rehab will be addressed by the presenter. The purpose of this lesson is to promote a comprehensive and effective weekly exercise routine in order to improve patients' overall level of fitness.   Managing Heart Disease: Your Path to a Healthier Heart Clinical staff led group instruction and group discussion with PowerPoint presentation and  patient guidebook. To enhance the learning environment the use of posters, models and videos may be added.At the conclusion of this workshop, patients will understand the anatomy and physiology of the heart. Additionally, they will understand how Pritikin's three pillars impact the risk factors, the progression, and the management of heart disease.  The purpose of this lesson is to provide a high-level overview of the heart, heart disease, and how the Pritikin lifestyle positively impacts risk factors.  Exercise Biomechanics Clinical staff led group instruction and group discussion with PowerPoint presentation and patient guidebook. To enhance the learning environment the use of posters, models and videos may be added. Patients will learn how the structural parts of their bodies function and how these functions impact their daily activities, movement, and exercise. Patients will learn how to promote a neutral spine, learn how to manage pain, and identify ways to improve their physical movement in order to promote healthy living. The purpose of this lesson is to expose patients to common physical limitations that impact physical activity. Participants will learn practical ways to adapt and manage aches and pains, and to minimize their effect on regular exercise. Patients will learn how to maintain good posture while sitting, walking, and lifting.  Balance Training and Fall Prevention  Clinical staff led group instruction and group discussion with PowerPoint presentation and patient guidebook. To enhance the learning environment the use of posters, models and videos may be added. At the conclusion of this workshop, patients will understand the importance of their sensorimotor skills (vision, proprioception, and the vestibular system) in maintaining their ability to balance as they age. Patients will apply a variety of balancing exercises that are appropriate for their current level of function.  Patients will understand the common causes for poor balance, possible solutions to these problems, and ways to modify their physical environment in order to minimize their fall risk. The purpose of this lesson is to teach patients about the importance of maintaining balance as they age and ways to minimize their risk of falling.  WORKSHOPS   Nutrition:  Fueling a Ship broker led group instruction and group discussion with PowerPoint presentation and patient guidebook. To enhance  the learning environment the use of posters, models and videos may be added. Patients will review the foundational principles of the Pritikin Eating Plan and understand what constitutes a serving size in each of the food groups. Patients will also learn Pritikin-friendly foods that are better choices when away from home and review make-ahead meal and snack options. Calorie density will be reviewed and applied to three nutrition priorities: weight maintenance, weight loss, and weight gain. The purpose of this lesson is to reinforce (in a group setting) the key concepts around what patients are recommended to eat and how to apply these guidelines when away from home by planning and selecting Pritikin-friendly options. Patients will understand how calorie density may be adjusted for different weight management goals.  Mindful Eating  Clinical staff led group instruction and group discussion with PowerPoint presentation and patient guidebook. To enhance the learning environment the use of posters, models and videos may be added. Patients will briefly review the concepts of the Pritikin Eating Plan and the importance of low-calorie dense foods. The concept of mindful eating will be introduced as well as the importance of paying attention to internal hunger signals. Triggers for non-hunger eating and techniques for dealing with triggers will be explored. The purpose of this lesson is to provide patients with the  opportunity to review the basic principles of the Pritikin Eating Plan, discuss the value of eating mindfully and how to measure internal cues of hunger and fullness using the Hunger Scale. Patients will also discuss reasons for non-hunger eating and learn strategies to use for controlling emotional eating.  Targeting Your Nutrition Priorities Clinical staff led group instruction and group discussion with PowerPoint presentation and patient guidebook. To enhance the learning environment the use of posters, models and videos may be added. Patients will learn how to determine their genetic susceptibility to disease by reviewing their family history. Patients will gain insight into the importance of diet as part of an overall healthy lifestyle in mitigating the impact of genetics and other environmental insults. The purpose of this lesson is to provide patients with the opportunity to assess their personal nutrition priorities by looking at their family history, their own health history and current risk factors. Patients will also be able to discuss ways of prioritizing and modifying the Pritikin Eating Plan for their highest risk areas  Menu  Clinical staff led group instruction and group discussion with PowerPoint presentation and patient guidebook. To enhance the learning environment the use of posters, models and videos may be added. Using menus brought in from [employer], or printed from [employer], patients will apply the Pritikin dining out guidelines that were presented in the [employer] video. Patients will also be able to practice these guidelines in a variety of provided scenarios. The purpose of this lesson is to provide patients with the opportunity to practice hands-on learning of the Pritikin Dining Out guidelines with actual menus and practice scenarios.  Label Reading Clinical staff led group instruction and group discussion with PowerPoint presentation and  patient guidebook. To enhance the learning environment the use of posters, models and videos may be added. Patients will review and discuss the Pritikin label reading guidelines presented in Pritikin's Label Reading Educational series video. Using fool labels brought in from local grocery stores and markets, patients will apply the label reading guidelines and determine if the packaged food meet the Pritikin guidelines. The purpose of this lesson is to provide patients with the opportunity to review, discuss,  and practice hands-on learning of the Pritikin Label Reading guidelines with actual packaged food labels. Cooking School  Pritikin's [employer] are designed to teach patients ways to prepare quick, simple, and affordable recipes at home. The importance of nutrition's role in chronic disease risk reduction is reflected in its emphasis in the overall Pritikin program. By learning how to prepare essential core Pritikin Eating Plan recipes, patients will increase control over what they eat; be able to customize the flavor of foods without the use of added salt, sugar, or fat; and improve the quality of the food they consume. By learning a set of core recipes which are easily assembled, quickly prepared, and affordable, patients are more likely to prepare more healthy foods at home. These workshops focus on convenient breakfasts, simple entres, side dishes, and desserts which can be prepared with minimal effort and are consistent with nutrition recommendations for cardiovascular risk reduction. Cooking [employer] are taught by a Armed forces logistics/support/administrative officer (RD) who has been trained by the [employer]. The chef or RD has a clear understanding of the importance of minimizing - if not completely eliminating - added fat, sugar, and sodium in recipes. Throughout the series of Cooking School Workshop sessions, patients will learn about healthy ingredients and efficient methods of  cooking to build confidence in their capability to prepare    Cooking School weekly topics:  Adding Flavor- Sodium-Free  Fast and Healthy Breakfasts  Powerhouse Plant-Based Proteins  Satisfying Salads and Dressings  Simple Sides and Sauces  International Cuisine-Spotlight on the [employer] Zones  Delicious Desserts  Savory Soups  [employer] - Meals in a Astronomer Appetizers and Snacks  Comforting Weekend Breakfasts  One-Pot Wonders   Fast Evening Meals  Landscape architect Your Pritikin Plate  WORKSHOPS   Healthy Mindset (Psychosocial):  Focused Goals, Sustainable Changes Clinical staff led group instruction and group discussion with PowerPoint presentation and patient guidebook. To enhance the learning environment the use of posters, models and videos may be added. Patients will be able to apply effective goal setting strategies to establish at least one personal goal, and then take consistent, meaningful action toward that goal. They will learn to identify common barriers to achieving personal goals and develop strategies to overcome them. Patients will also gain an understanding of how our mind-set can impact our ability to achieve goals and the importance of cultivating a positive and growth-oriented mind-set. The purpose of this lesson is to provide patients with a deeper understanding of how to set and achieve personal goals, as well as the tools and strategies needed to overcome common obstacles which may arise along the way.  From Head to Heart: The Power of a Healthy Outlook  Clinical staff led group instruction and group discussion with PowerPoint presentation and patient guidebook. To enhance the learning environment the use of posters, models and videos may be added. Patients will be able to recognize and describe the impact of emotions and mood on physical health. They will discover the importance of self-care and explore self-care practices which may work  for them. Patients will also learn how to utilize the 4 C's to cultivate a healthier outlook and better manage stress and challenges. The purpose of this lesson is to demonstrate to patients how a healthy outlook is an essential part of maintaining good health, especially as they continue their cardiac rehab journey.  Healthy Sleep for a Healthy Heart Clinical staff led group instruction and group discussion  with PowerPoint presentation and patient guidebook. To enhance the learning environment the use of posters, models and videos may be added. At the conclusion of this workshop, patients will be able to demonstrate knowledge of the importance of sleep to overall health, well-being, and quality of life. They will understand the symptoms of, and treatments for, common sleep disorders. Patients will also be able to identify daytime and nighttime behaviors which impact sleep, and they will be able to apply these tools to help manage sleep-related challenges. The purpose of this lesson is to provide patients with a general overview of sleep and outline the importance of quality sleep. Patients will learn about a few of the most common sleep disorders. Patients will also be introduced to the concept of "sleep hygiene," and discover ways to self-manage certain sleeping problems through simple daily behavior changes. Finally, the workshop will motivate patients by clarifying the links between quality sleep and their goals of heart-healthy living.   Recognizing and Reducing Stress Clinical staff led group instruction and group discussion with PowerPoint presentation and patient guidebook. To enhance the learning environment the use of posters, models and videos may be added. At the conclusion of this workshop, patients will be able to understand the types of stress reactions, differentiate between acute and chronic stress, and recognize the impact that chronic stress has on their health. They will also be able to  apply different coping mechanisms, such as reframing negative self-talk. Patients will have the opportunity to practice a variety of stress management techniques, such as deep abdominal breathing, progressive muscle relaxation, and/or guided imagery.  The purpose of this lesson is to educate patients on the role of stress in their lives and to provide healthy techniques for coping with it.  Learning Barriers/Preferences:  Learning Barriers/Preferences - 03/11/24 1338       Learning Barriers/Preferences   Learning Barriers Hearing    Learning Preferences Computer/Internet;Group Instruction;Individual Instruction;Skilled Demonstration;Verbal Instruction;Video          Education Topics:  Knowledge Questionnaire Score:  Knowledge Questionnaire Score - 03/11/24 1338       Knowledge Questionnaire Score   Pre Score 24/24          Core Components/Risk Factors/Patient Goals at Admission:  Personal Goals and Risk Factors at Admission - 03/11/24 1338       Core Components/Risk Factors/Patient Goals on Admission    Weight Management Yes;Obesity;Weight Loss    Intervention Weight Management: Develop a combined nutrition and exercise program designed to reach desired caloric intake, while maintaining appropriate intake of nutrient and fiber, sodium and fats, and appropriate energy expenditure required for the weight goal.;Weight Management: Provide education and appropriate resources to help participant work on and attain dietary goals.;Weight Management/Obesity: Establish reasonable short term and long term weight goals.;Obesity: Provide education and appropriate resources to help participant work on and attain dietary goals.    Admit Weight 268 lb 8.3 oz (121.8 kg)    Goal Weight: Long Term 200 lb (90.7 kg)   pt goal   Expected Outcomes Short Term: Continue to assess and modify interventions until short term weight is achieved;Long Term: Adherence to nutrition and physical activity/exercise  program aimed toward attainment of established weight goal;Weight Loss: Understanding of general recommendations for a balanced deficit meal plan, which promotes 1-2 lb weight loss per week and includes a negative energy balance of [phone] kcal/d;Understanding recommendations for meals to include 15-35% energy as protein, 25-35% energy from fat, 35-60% energy from carbohydrates, less than 200mg  of  dietary cholesterol, 20-35 gm of total fiber daily;Understanding of distribution of calorie intake throughout the day with the consumption of 4-5 meals/snacks    Diabetes --    Intervention --    Expected Outcomes --    Hypertension Yes    Intervention Provide education on lifestyle modifcations including regular physical activity/exercise, weight management, moderate sodium restriction and increased consumption of fresh fruit, vegetables, and low fat dairy, alcohol moderation, and smoking cessation.;Monitor prescription use compliance.    Expected Outcomes Long Term: Maintenance of blood pressure at goal levels.;Short Term: Continued assessment and intervention until BP is < 140/34mm HG in hypertensive participants. < 130/46mm HG in hypertensive participants with diabetes, heart failure or chronic kidney disease.    Lipids Yes    Intervention Provide education and support for participant on nutrition & aerobic/resistive exercise along with prescribed medications to achieve LDL 70mg , HDL >40mg .    Expected Outcomes Short Term: Participant states understanding of desired cholesterol values and is compliant with medications prescribed. Participant is following exercise prescription and nutrition guidelines.;Long Term: Cholesterol controlled with medications as prescribed, with individualized exercise RX and with personalized nutrition plan. Value goals: LDL < 70mg , HDL > 40 mg.    Stress Yes    Intervention Refer participants experiencing significant psychosocial distress to appropriate mental health specialists  for further evaluation and treatment. When possible, include family members and significant others in education/counseling sessions.;Offer individual and/or small group education and counseling on adjustment to heart disease, stress management and health-related lifestyle change. Teach and support self-help strategies.    Expected Outcomes Long Term: Emotional wellbeing is indicated by absence of clinically significant psychosocial distress or social isolation.;Short Term: Participant demonstrates changes in health-related behavior, relaxation and other stress management skills, ability to obtain effective social support, and compliance with psychotropic medications if prescribed.          Core Components/Risk Factors/Patient Goals Review:    Core Components/Risk Factors/Patient Goals at Discharge (Final Review):    ITP Comments:  ITP Comments     Row Name 03/11/24 1321           ITP Comments Dr. Wilbert Bihari medical director. Introduction to [facility] education/intensive cardiac rehab. Initial orientation packet reviewed with patient.          Comments: Participant attended orientation for the cardiac rehabilitation program on  03/11/2024  to perform initial intake and exercise walk test. Patient introduced to the [facility] education and orientation packet was reviewed. Completed 6-minute walk test, measurements, initial ITP, and exercise prescription. Vital signs stable. Telemetry-normal sinus rhythm, asymptomatic. Pt did not take meds this morning, was instructed to eat and take medications prior to exercise sessions and voiced understanding.   Service time was from 0920 to 1111.  Con KATHEE Pereyra, MS, ACSM-CEP 03/11/2024 1:42 PM

## 2024-03-15 ENCOUNTER — Ambulatory Visit: Admitting: Cardiology

## 2024-03-15 ENCOUNTER — Ambulatory Visit (HOSPITAL_COMMUNITY)

## 2024-03-17 ENCOUNTER — Ambulatory Visit (HOSPITAL_COMMUNITY)

## 2024-03-19 ENCOUNTER — Encounter (HOSPITAL_COMMUNITY)
Admission: RE | Admit: 2024-03-19 | Discharge: 2024-03-19 | Disposition: A | Discharge status: Home / Self Care | Source: Ambulatory Visit | Attending: Cardiovascular Disease | Admitting: Cardiovascular Disease

## 2024-03-19 DIAGNOSIS — Z951 Presence of aortocoronary bypass graft: Secondary | ICD-10-CM

## 2024-03-19 NOTE — Progress Notes (Signed)
 Daily Session Note  Patient Details  Name: Samuel Chambers MRN: [id number] Date of Birth: 02-11-1951 Referring Provider:   Flowsheet Row INTENSIVE CARDIAC REHAB ORIENT from 03/11/2024 in [facility]  Referring Provider Jerel Balding, MD    Encounter Date: 03/19/2024  Check In:  Session Check In - 03/19/24 1018       Check-In   Supervising physician immediately available to respond to emergencies [facility] MD immediately available    Physician(s) Rosaline Bane, NP    Location [facility]    Staff Present Con Pereyra, MS, Exercise Physiologist;Joseph Lennon, RN, BSN;Johnny Fayette, MS, Exercise Physiologist;Jetta Vannie BS, ACSM-CEP, Exercise Physiologist;David Janann, MS, ACSM-CEP, CCRP, Exercise Physiologist;Claudene Gatliff Valere, MS, ACSM-CEP, Exercise Physiologist    Virtual Visit No    Medication changes reported     No    Fall or balance concerns reported    No    Tobacco Cessation No Change    Warm-up and Cool-down Performed as group-led instruction    Resistance Training Performed Yes    VAD Patient? No    PAD/SET Patient? No      Pain Assessment   Currently in Pain? No/denies    Pain Score 0-No pain    Multiple Pain Sites No          Capillary Blood Glucose: No results found for this or any previous visit (from the past 24 hours).   Exercise Prescription Changes - 03/19/24 1027       Response to Exercise   Blood Pressure (Admit) 132/70    Blood Pressure (Exercise) 152/72    Blood Pressure (Exit) 114/66    Heart Rate (Admit) 78 bpm    Heart Rate (Exercise) 113 bpm    Heart Rate (Exit) 87 bpm    Rating of Perceived Exertion (Exercise) 10    Symptoms None    Comments Off to a very good start with exercise. Increased workload on recumbent stepper from 3 to 4 the last 3 minutes.    Duration Continue with 30 min of aerobic exercise without signs/symptoms of physical distress.    Intensity  THRR unchanged      Progression   Progression Continue to progress workloads to maintain intensity without signs/symptoms of physical distress.    Average METs 2.5      Resistance Training   Training Prescription Yes    Weight 4 lbs    Reps 10-15    Time 5 Minutes      Interval Training   Interval Training No      Recumbant Bike   Level 1    RPM 81    Watts 22    Minutes 15    METs 2.2      NuStep   Level 4    SPM 101    Minutes 15    METs 2.8          Social History   Tobacco Use  Smoking Status Never   Passive exposure: Past (PARENTS SMOKED)  Smokeless Tobacco Never    Goals Met:  Exercise tolerated well No report of concerns or symptoms today Strength training completed today  Goals Unmet:  Not Applicable  Comments: Samuel Chambers started exercise in the cardiac rehab program today. He tolerated exercise well without symptoms and was eager to progress workloads. Blood pressure and heart rate within normal limits at rest and with exercise. Telemetry was normal sinus rhythm, and no symptoms reported.  Dr. Wilbert Bihari is Medical Director for Cardiac Rehab at [facility].

## 2024-03-22 ENCOUNTER — Encounter (HOSPITAL_COMMUNITY)
Admission: RE | Admit: 2024-03-22 | Discharge: 2024-03-22 | Disposition: A | Discharge status: Home / Self Care | Source: Ambulatory Visit | Attending: Cardiovascular Disease | Admitting: Cardiovascular Disease

## 2024-03-22 DIAGNOSIS — Z951 Presence of aortocoronary bypass graft: Secondary | ICD-10-CM

## 2024-03-23 ENCOUNTER — Telehealth (HOSPITAL_COMMUNITY): Payer: Self-pay

## 2024-03-23 NOTE — Telephone Encounter (Signed)
 Patient c/o for 10:15 CR class on 8/27, no reason given.

## 2024-03-23 NOTE — Progress Notes (Unsigned)
 Cardiology Office Note    Date:  03/25/2024  ID:  Samuel Chambers, Samuel Chambers October 25, 1950, MRN [id number] PCP:  Purcell Emil Schanz, MD  Cardiologist:  Jerel Balding, MD  Electrophysiologist:  None   Chief Complaint: Follow up for CAD   History of Present Illness: .    Samuel Chambers is a 73 y.o. male with visit-pertinent history of CAD, DM, hypertension, hyperlipidemia, fibromyalgia and GERD.  Patient with history of DES placed in his circumflex in 2008.  Echocardiogram in July 2024 with LVEF 60 to 65%, moderate aortic stenosis.  Patient has previously been followed by Dr. Balding, patient was seen by Dr. Verlin on 12/18/2023 for an add-on visit with complaints of dyspnea and chest pressure.  PET/CT stress test on 12/30/2023 indicated inferior wall perfusion defect consistent with ischemia.  He was seen by Dr. Verlin on 01/01/2024 for follow-up regarding stress test he reported ongoing dyspnea with exertion and chest pressure.  Patient reported chronic diffuse body aches without recent changes.  Given ongoing dyspnea with exertion and exertional chest pressure concerning for unstable angina a right and left cardiac catheterization was arranged.  Cardiac catheterization showed severe multivessel CAD with proximal RCA 95% ulcerated stenosis is culprit for stress test followed by 80% distal, long segment in the LAD after first diagonal 60%, 70% and 90% stenosis, proximal first tag with 90% stenosis, previously placed AV groove circumflex stent had 80% ISR, by catheterization the aortic valve gradient was not significant.  He had moderately elevated LVEDP and PCWP of 23 to 24 mmHg suggestive of diastolic heart failure.  It was recommended that patient have admission to inpatient CVTS consultation.  Echocardiogram on 01/07/2024 indicated LVEF 60 to 65%, no RWMA, mild concentric LVH, diastolic parameters were indeterminate, RV systolic function and size was normal, mitral valve with trivial regurgitation  and no evidence of stenosis, aortic valve had moderate calcification with moderate aortic valve stenosis and mild regurgitation.  On 01/09/2024 patient underwent CABG x 3 utilizing LIMA to LAD, SVG to PDA and SVG to diagonal as well as endoscopic harvest of the right greater saphenous vein.  After intraoperative TEE was not felt the aortic valve required replacement.  Patient developed postoperative atrial fibrillation with RVR, he was treated with IV amiodarone  drip, he did not initially convert but rate improved and then developed intermittent atrial fibrillation.  IV amiodarone  was transitioned to p.o. amiodarone .  Patient was hypertensive, metoprolol  was titrated and he was restarted on low-dose Cozaar .  Patient was discharged on 01/14/2024 in stable condition.  Patient was seen in clinic on 01/28/2024 for follow-up.  He reported he been doing well overall.  Patient reported concerns regarding use of amiodarone , noted that in the 2 to 3 hours after taking his amiodarone  he would have significant shortness of breath and increased coughing resulting in pain in his surgical sites.  He noted that symptoms typically resolved after 3 hours and then he would feel back to his normal.  Patient denied any palpitations or feeling of irregular heartbeats.  Amiodarone  was discontinued and 30-day cardiac monitor was ordered, this showed no evidence of atrial fibrillation.   Today he presents for follow-up.  He reports that he is doing well. He notes he is limited by his fibromyalgia on days he attends cardiac rehab, notes that he is able to exercise however following exercise he has significant body aches and pains related to his fibromyalgia. He reports he has returned to his normal activity and does not feel as  though cardiac rehab is providing him any improvement, does not plan to continue with cardiac rehab at this time.  Patient notes that he has chronic bilateral shoulder pain, hip pain and knee pain, denies any  significant chest pain, notes that with ongoing exertion he does become slightly short of breath however does feel that this has been improving compared to prior to his surgery.  He denies any palpitations, lower extremity edema, orthopnea or PND. ROS: .   Today he denies chest pain, shortness of breath, lower extremity edema, fatigue, palpitations, melena, hematuria, hemoptysis, diaphoresis, weakness, presyncope, syncope, orthopnea, and PND.  All other systems are reviewed and otherwise negative. Studies Reviewed: SABRA   EKG:  EKG is not ordered today.  CV Studies: Cardiac studies reviewed are outlined and summarized above. Otherwise please see EMR for full report. Cardiac Studies & Procedures   ______________________________________________________________________________________________ CARDIAC CATHETERIZATION  CARDIAC CATHETERIZATION 01/06/2024  Conclusion Images from the original result were not included.    1st Diag lesion is 90% stenosed.   Prox LAD to Mid LAD lesion is 60% stenosed.  Mid LAD lesion is 70% stenosed. Mid LAD to Dist LAD lesion is 90% stenosed.  (Long segment of combined tandem lesions)   LPAV lesion is 80% stenosed.   Prox RCA-1 lesion is 20% stenosed.   Prox RCA-2 lesion is 95% stenosed (discrete eccentric/ulcerated).  Dist RCA lesion is 80% stenosed.   Hemodynamic findings consistent with mild pulmonary hypertension (mostly WHO Class III related to DHF)).  Dominance: Right.  POSTOP CATH FINDINGS Severe multivessel CAD: Notably proximal RCA 95% ulcerated stenosis as culprit for stress test followed by 80% distal; long segment in the LAD after 1st Diag with 60%, 70% and 90% stenoses Prox 1st Diag with 90% stenosis, previously placed AV groove circumflex stent has 80% ISR (not target for bypass) By catheterization, the aortic valve gradient is not significant. Moderately elevated LVEDP and PCWP of 23-24 mmHg suggestive of Diastolic Heart  Failure  RECOMMENDATIONS Based on the patient having progressive angina class III-IV with some resting symptoms and now documented severe three-vessel disease, recommend admission to inpatient for inpatient CVTS consultation.   Anticipated discharge date to be determined. Will place on IV heparin  based on unstable presentation and unstable plaque in the RCA. Will check 2D echocardiogram, especially in light of his previously documented moderate AS He has been statin intolerant, will need to address lipid management on discharge. Will home BP meds, but will replace HCTZ with p.o. Lasix     Alm Clay, MD  Findings Coronary Findings Diagnostic  Dominance: Right  Left Main Vessel was injected. Vessel is normal in caliber.  Left Anterior Descending Prox LAD to Mid LAD lesion is 60% stenosed. Mid LAD lesion is 70% stenosed. Mid LAD to Dist LAD lesion is 90% stenosed.  First Diagonal Branch There is mild disease in the vessel. 1st Diag lesion is 90% stenosed.  Second Diagonal Branch Vessel is small in size.  Left Circumflex Vessel is moderate in size.  Left Posterior Atrioventricular Artery Vessel is small in size. LPAV lesion is 80% stenosed. The lesion is distal to major branch and focal. The lesion was previously treated using a drug eluting stent over 2 years ago. Previously placed stent displays restenosis.  Right Coronary Artery Vessel was injected. Vessel is normal in caliber. There is severe focal disease in the vessel. The vessel is tortuous. Prox RCA-1 lesion is 20% stenosed. Prox RCA-2 lesion is 95% stenosed. The lesion is type B2, located at  the bend, discrete and ulcerative. The lesion is calcified. Dist RCA lesion is 80% stenosed.  Right Ventricular Branch Vessel is small in size.  Right Posterior Descending Artery Vessel is small in size.  First Right Posterolateral Branch Vessel is small in size.  Intervention  No interventions have been  documented.   STRESS TESTS  NM PET CT CARDIAC PERFUSION MULTI W/ABSOLUTE BLOODFLOW 12/30/2023  Narrative   Basal to mid inferior reversible perfusion defect consistent wtih ischemia.  Myocardial blood flow reserve is mildly reduced (1.9), though more significantly reduced in area of perfusion defect (1.4).  TID is present (1.18), which is a high risk finding.  No drop in EF with stress.  Overall, findings suggest inferior ischemia and study is intermediate risk   LV perfusion is abnormal. There is evidence of ischemia. Defect 1: There is a small defect with mild reduction in uptake present in the mid to basal inferior location(s) that is reversible. There is normal wall motion in the defect area. Consistent with ischemia. The defect is consistent with abnormal perfusion in the RCA territory.   Rest left ventricular function is normal. Rest EF: 69%. Stress left ventricular function is normal. Stress EF: 73%. End diastolic cavity size is normal. End systolic cavity size is normal.   Myocardial blood flow was computed to be 0.75ml/g/min at rest and 1.82ml/g/min at stress. Global myocardial blood flow reserve was 1.90 and was mildly abnormal.   Coronary calcium assessment not performed due to prior revascularization.   Findings are consistent with ischemia. The study is intermediate risk.   Electronically signed by Lonni Nanas, MD  CLINICAL DATA:  This over-read does not include interpretation of cardiac or coronary anatomy or pathology. No interpretation the PET data set. The cardiac PET-CT interpretation by the cardiologist is attached.  COMPARISON:  None Available.  FINDINGS: Limited view of the lung parenchyma demonstrates no suspicious nodularity. Airways are normal.  Limited view of the mediastinum demonstrates no adenopathy. Esophagus normal.  Limited view of the upper abdomen demonstrates low-attenuation in the liver. Postcholecystectomy.  Limited view of the skeleton and  chest wall is unremarkable.  IMPRESSION: 1. No acute pulmonary findings. 2. Hepatic steatosis.   Electronically Signed By: Jackquline Boxer M.D. On: 12/30/2023 14:14   ECHOCARDIOGRAM  ECHOCARDIOGRAM COMPLETE 01/07/2024  Narrative ECHOCARDIOGRAM REPORT    Patient Name:   Samuel Chambers Date of Exam: 01/07/2024 Medical Rec #:  [id number]          Height:       76.0 in Accession #:    [id number]         Weight:       266.3 lb Date of Birth:  11-16-1950           BSA:          2.502 m Patient Age:    72 years           BP:           159/76 mmHg Patient Gender: M                  HR:           79 bpm. Exam Location:  Inpatient  Procedure: 2D Echo, Cardiac Doppler and Color Doppler (Both Spectral and Color Flow Doppler were utilized during procedure).  Indications:    I35.0 Nonrheumatic aortic (valve) stenosis  History:        Patient has prior history of Echocardiogram examinations, most recent 02/11/2023.  Sonographer:    Eva Lash Referring Phys: CALLIE E GOODRICH  IMPRESSIONS   1. Left ventricular ejection fraction, by estimation, is 60 to 65%. The left ventricle has normal function. The left ventricle has no regional wall motion abnormalities. There is mild concentric left ventricular hypertrophy. Left ventricular diastolic parameters are indeterminate. 2. Right ventricular systolic function is normal. The right ventricular size is normal. 3. The mitral valve is normal in structure. Trivial mitral valve regurgitation. No evidence of mitral stenosis. 4. The aortic valve is calcified. There is moderate calcification of the aortic valve. There is moderate thickening of the aortic valve. Aortic valve regurgitation is mild. Moderate aortic valve stenosis. Aortic valve mean gradient measures 21.0 mmHg. Aortic valve Vmax measures 3.26 m/s. 5. Cannot exclude a small PFO.  Comparison(s): No significant change from prior study.  FINDINGS Left Ventricle: Left ventricular  ejection fraction, by estimation, is 60 to 65%. The left ventricle has normal function. The left ventricle has no regional wall motion abnormalities. Definity  contrast agent was given IV to delineate the left ventricular endocardial borders. The left ventricular internal cavity size was normal in size. There is mild concentric left ventricular hypertrophy. Left ventricular diastolic parameters are indeterminate.  Right Ventricle: The right ventricular size is normal. No increase in right ventricular wall thickness. Right ventricular systolic function is normal.  Left Atrium: Left atrial size was normal in size.  Right Atrium: Right atrial size was normal in size.  Pericardium: There is no evidence of pericardial effusion.  Mitral Valve: The mitral valve is normal in structure. Trivial mitral valve regurgitation. No evidence of mitral valve stenosis.  Tricuspid Valve: The tricuspid valve is grossly normal. Tricuspid valve regurgitation is trivial. No evidence of tricuspid stenosis.  Aortic Valve: The aortic valve is calcified. There is moderate calcification of the aortic valve. There is moderate thickening of the aortic valve. Aortic valve regurgitation is mild. Moderate aortic stenosis is present. Aortic valve mean gradient measures 21.0 mmHg. Aortic valve peak gradient measures 42.5 mmHg. Aortic valve area, by VTI measures 1.64 cm.  Pulmonic Valve: The pulmonic valve was not well visualized. Pulmonic valve regurgitation is mild. No evidence of pulmonic stenosis.  Aorta: The aortic root and ascending aorta are structurally normal, with no evidence of dilitation. Ascending aorta measurements are within normal limits for age when indexed to body surface area.  Venous: The inferior vena cava was not well visualized.  IAS/Shunts: Cannot exclude a small PFO.   LEFT VENTRICLE PLAX 2D LVIDd:         4.60 cm      Diastology LVIDs:         2.90 cm      LV e' medial:    5.77 cm/s LV PW:          1.50 cm      LV E/e' medial:  16.2 LV IVS:        1.20 cm      LV e' lateral:   9.68 cm/s LVOT diam:     2.20 cm      LV E/e' lateral: 9.6 LV SV:         116 LV SV Index:   46 LVOT Area:     3.80 cm  LV Volumes (MOD) LV vol d, MOD A2C: 103.0 ml LV vol d, MOD A4C: 127.0 ml LV vol s, MOD A2C: 28.0 ml LV vol s, MOD A4C: 33.7 ml LV SV MOD A2C:     75.0 ml LV  SV MOD A4C:     127.0 ml LV SV MOD BP:      85.9 ml  RIGHT VENTRICLE RV S prime:     18.00 cm/s TAPSE (M-mode): 2.2 cm  LEFT ATRIUM             Index LA diam:        4.80 cm 1.92 cm/m LA Vol (A2C):   52.7 ml 21.06 ml/m LA Vol (A4C):   41.1 ml 16.43 ml/m LA Biplane Vol: 48.3 ml 19.31 ml/m AORTIC VALVE AV Area (Vmax):    1.53 cm AV Area (Vmean):   1.42 cm AV Area (VTI):     1.64 cm AV Vmax:           326.00 cm/s AV Vmean:          213.000 cm/s AV VTI:            0.708 m AV Peak Grad:      42.5 mmHg AV Mean Grad:      21.0 mmHg LVOT Vmax:         131.00 cm/s LVOT Vmean:        79.800 cm/s LVOT VTI:          0.305 m LVOT/AV VTI ratio: 0.43  AORTA Ao Asc diam: 3.80 cm  MITRAL VALVE MV Area (PHT): 3.13 cm    SHUNTS MV Decel Time: 242 msec    Systemic VTI:  0.30 m MV E velocity: 93.30 cm/s  Systemic Diam: 2.20 cm MV A velocity: 82.30 cm/s MV E/A ratio:  1.13  Shelda Bruckner MD Electronically signed by Shelda Bruckner MD Signature Date/Time: 01/07/2024/1:55:49 PM    Final   TEE  ECHO INTRAOPERATIVE TEE 01/09/2024  Narrative *INTRAOPERATIVE TRANSESOPHAGEAL REPORT *    Patient Name:   Samuel Chambers Date of Exam: 01/09/2024 Medical Rec #:  [id number]          Height:       76.0 in Accession #:    [id number]         Weight:       262.0 lb Date of Birth:  1951-07-28           BSA:          2.48 m Patient Age:    72 years           BP:           140/78 mmHg Patient Gender: M                  HR:           58 bpm. Exam Location:  Inpatient  Transesophogeal exam was perform  intraoperatively during surgical procedure. Patient was closely monitored under general anesthesia during the entirety of examination.  Indications:     coronary artery disease. Sonographer:     Tinnie Barefoot RDCS Performing Phys: Debby Like MD Diagnosing Phys: Debby Like MD  Complications: No known complications during this procedure. POST-OP IMPRESSIONS _ Left Ventricle: The left ventricle is unchanged from pre-bypass. _ Right Ventricle: The right ventricle appears unchanged from pre-bypass. _ Aorta: The aorta appears unchanged from pre-bypass. _ Left Atrium: The left atrium appears unchanged from pre-bypass. _ Left Atrial Appendage: The left atrial appendage appears unchanged from pre-bypass. _ Aortic Valve: The aortic valve appears unchanged from pre-bypass. _ Mitral Valve: The mitral valve appears unchanged from pre-bypass. _ Tricuspid Valve: The tricuspid valve appears unchanged from pre-bypass. _ Pulmonic Valve: The pulmonic  valve appears unchanged from pre-bypass. _ Interatrial Septum: The interatrial septum appears unchanged from pre-bypass. _ Interventricular Septum: The interventricular septum appears unchanged from pre-bypass. _ Pericardium: The pericardium appears unchanged from pre-bypass.  PRE-OP FINDINGS Left Ventricle: The left ventricle has normal systolic function, with an ejection fraction of 60-65%. The cavity size was normal. There is mild concentric left ventricular hypertrophy.   Right Ventricle: The right ventricle has normal systolic function. The cavity was normal. There is no increase in right ventricular wall thickness.  Left Atrium: Left atrial size was normal in size. No left atrial/left atrial appendage thrombus was detected. The left atrial appendage is well visualized and there is no evidence of thrombus present.  Right Atrium: Right atrial size was normal in size.  Interatrial Septum: No atrial level shunt detected by color flow  Doppler.  Pericardium: There is no evidence of pericardial effusion.  Mitral Valve: The mitral valve is normal in structure. Mitral valve regurgitation is trivial by color flow Doppler. There is No evidence of mitral stenosis.  Tricuspid Valve: The tricuspid valve was normal in structure. Tricuspid valve regurgitation was not visualized by color flow Doppler. No evidence of tricuspid stenosis is present.  Aortic Valve: The aortic valve is tricuspid Aortic valve regurgitation is trivial by color flow Doppler. There is moderate stenosis of the aortic valve. There is moderate aortic annular calcification noted.   Pulmonic Valve: The pulmonic valve was not assessed. Pulmonic valve regurgitation was not assessed by color flow Doppler.   Aorta: The aortic root, ascending aorta and aortic arch are normal in size and structure.   Debby Like MD Electronically signed by Debby Like MD Signature Date/Time: 01/09/2024/1:55:20 PM    Final  MONITORS  CARDIAC EVENT MONITOR 03/09/2024  Narrative   The dominant rhythm is normal sinus rhythm, occasionally with first degree AV block.   There are rare isolated premature atrial and ventricular contractions (each under 1%).   There is no atrial fibrillation, ventricular tachycardia or other complex tachyarrhythmia.   There is no severe bradycardia, higher grade AV block or pauses.   During symptom triggered recordings, no rhythm abnormality is present.  Normal arrhythmia monitor. During symptom-triggered recordings, the rhythm is normal. No atrial fibrillation is seen.       ______________________________________________________________________________________________       Current Reported Medications:.    Current Meds  Medication Sig   acetaminophen  (TYLENOL ) 500 MG tablet Take 1,000 mg by mouth every 6 (six) hours as needed for moderate pain (pain score 4-6).   aspirin  EC 325 MG tablet Take 1 tablet (325 mg total) by mouth daily.    DULoxetine  (CYMBALTA ) 60 MG capsule TAKE 1 CAPSULE BY MOUTH EVERY DAY   hydrochlorothiazide  (HYDRODIURIL ) 25 MG tablet Take 1 tablet (25 mg total) by mouth daily.   HYDROcodone  bit-homatropine (HYCODAN) 5-1.5 MG/5ML syrup Take 5 mLs by mouth every 6 (six) hours as needed for cough.   losartan  (COZAAR ) 100 MG tablet Take 1 tablet (100 mg total) by mouth daily.   metoprolol  succinate (TOPROL -XL) 50 MG 24 hr tablet Take 1 tablet (50 mg total) by mouth daily.   Physical Exam:    VS:  BP 134/78   Pulse 76   Ht 5' 11.5 (1.816 m)   Wt 271 lb 6.4 oz (123.1 kg)   SpO2 96%   BMI 37.33 kg/m    Wt Readings from Last 3 Encounters:  03/25/24 271 lb 6.4 oz (123.1 kg)  03/11/24 268 lb 8.3 oz (121.8 kg)  02/18/24 265 lb (120.2 kg)    GEN: Well nourished, well developed in no acute distress NECK: No JVD; No carotid bruits CARDIAC: RRR, no murmurs, rubs, gallops RESPIRATORY:  Clear to auscultation without rales, wheezing or rhonchi  ABDOMEN: Soft, non-tender, non-distended EXTREMITIES:  No edema; No acute deformity     Asessement and Plan:.    CAD: s/p CABG x 3 utilizing LIMA to LAD, SVG to PDA and SVG to diagonal as well as endoscopic harvest of the right greater saphenous vein on 01/09/24.  That from a cardiac standpoint he is doing well, notes that he continues to have significant pain and body aches related to history of fibromyalgia.  He reports that attending cardiac rehab and doing intentional exercise has made his body aches significantly works, he does not plan to continue with cardiac rehab at this time. Stable with no anginal symptoms. No indication for ischemic evaluation.  Heart healthy diet and regular cardiovascular exercise encouraged.  Discussed the importance of good cholesterol control, patient reports that he is aware however he is not interested on starting on any medications out of concern for worsening his body pains, he verbalizes understanding of risk associated with uncontrolled  cholesterol levels.  Reviewed ED precautions.  Continue aspirin , hydrochlorothiazide  25 mg daily, losartan  100 mg daily and metoprolol  succinate 50 mg daily.  Post op atrial fibrillation/shortness of breath: Patient with postoperative atrial fibrillation, was loaded with amiodarone  while in hospital.  At follow-up patient reported concerns reporting amiodarone  noting it caused significant shortness of breath and increased coughing with chest discomfort in the 2 to 3 hours after taking the medication.  Amiodarone  was discontinued. Cardiac monitor indicated dominant rhythm was normal sinus rhythm, rare isolated PACs and PVCs, burden under 1%, no atrial fibrillation, ventricular tachycardia or other complex tachyarrhythmia was noted.  Today he denies any palpitations or feeling of irregular heartbeats. Continue metoprolol  succinate 50 mg daily.  Hypertension: Blood pressure today 134/78.   He reports that his blood pressures while attending cardiac rehab have been very well-controlled. Continue HCTZ 25 mg daily, losartan  100 mg daily and metoprolol  succinate 50 mg daily  Hyperlipidemia: Patient seen by Melissa Maccia, PharmD on 02/02/2024 regarding hypertension and dyslipidemia, patient deferred starting on PCSK9 or Leqvio at that time. Patient has declined cholesterol medications, as noted above under CAD. Check fasting lipid profile today.   Aortic valve stenosis: Echo on 01/07/2024 indicated aortic valve had moderate calcification with moderate aortic valve stenosis and mild regurgitation.  Not felt to be significant enough for AVR during procedure.  Plan for repeat echocardiogram next year.   Disposition: F/u with Dr. Francyne in 5-6 months per patient request.   Signed, Aizik Reh D Rockwell Zentz, NP

## 2024-03-24 ENCOUNTER — Encounter (HOSPITAL_COMMUNITY)

## 2024-03-25 ENCOUNTER — Telehealth (HOSPITAL_COMMUNITY): Payer: Self-pay

## 2024-03-25 ENCOUNTER — Ambulatory Visit: Attending: Cardiology | Admitting: Cardiology

## 2024-03-25 ENCOUNTER — Encounter: Payer: Self-pay | Admitting: Cardiology

## 2024-03-25 VITALS — BP 134/78 | HR 76 | Ht 71.5 in | Wt 271.4 lb

## 2024-03-25 DIAGNOSIS — E785 Hyperlipidemia, unspecified: Secondary | ICD-10-CM | POA: Insufficient documentation

## 2024-03-25 DIAGNOSIS — I350 Nonrheumatic aortic (valve) stenosis: Secondary | ICD-10-CM | POA: Diagnosis present

## 2024-03-25 DIAGNOSIS — I480 Paroxysmal atrial fibrillation: Secondary | ICD-10-CM | POA: Insufficient documentation

## 2024-03-25 DIAGNOSIS — I10 Essential (primary) hypertension: Secondary | ICD-10-CM | POA: Insufficient documentation

## 2024-03-25 DIAGNOSIS — I2510 Atherosclerotic heart disease of native coronary artery without angina pectoris: Secondary | ICD-10-CM | POA: Diagnosis present

## 2024-03-25 NOTE — Patient Instructions (Signed)
 Medication Instructions:  No changes *If you need a refill on your cardiac medications before your next appointment, please call your pharmacy*  Lab work Today we are going to draw a lipid profile If you have labs (blood work) drawn today and your tests are completely normal, you will receive your results only by: MyChart Message (if you have MyChart) OR A paper copy in the mail If you have any lab test that is abnormal or we need to change your treatment, we will call you to review the results.  Testing/Procedures: No testing  Follow-Up: At [facility], you and your health needs are our priority.  As part of our continuing mission to provide you with exceptional heart care, our providers are all part of one team.  This team includes your primary Cardiologist (physician) and Advanced Practice Providers or APPs (Physician Assistants and Nurse Practitioners) who all work together to provide you with the care you need, when you need it.  Your next appointment:   5 month(s)  Provider:   Jerel Balding, MD

## 2024-03-25 NOTE — Telephone Encounter (Signed)
 Patient called stating he would like to cancel his cardiac rehab appts. He states he has fibromyalgia and at night after he attends rehab he is in consistent pain. He says he has enjoyed the program but the pain is too much to handle.  Cancelled remaining cardiac rehab appts.

## 2024-03-26 ENCOUNTER — Ambulatory Visit: Payer: Self-pay | Admitting: Cardiology

## 2024-03-26 ENCOUNTER — Encounter (HOSPITAL_COMMUNITY)

## 2024-03-26 LAB — LIPID PANEL
Chol/HDL Ratio: 6.1 ratio — ABNORMAL HIGH (ref 0.0–5.0)
Cholesterol, Total: 184 mg/dL (ref 100–199)
HDL: 30 mg/dL — ABNORMAL LOW (ref >39)
LDL Chol Calc (NIH): 110 mg/dL — ABNORMAL HIGH (ref 0–99)
Triglycerides: 251 mg/dL — ABNORMAL HIGH (ref 0–149)
VLDL Cholesterol Cal: 44 mg/dL — ABNORMAL HIGH (ref 5–40)

## 2024-03-31 ENCOUNTER — Encounter (HOSPITAL_COMMUNITY)

## 2024-04-02 ENCOUNTER — Encounter (HOSPITAL_COMMUNITY)

## 2024-04-05 ENCOUNTER — Encounter (HOSPITAL_COMMUNITY)

## 2024-04-07 ENCOUNTER — Encounter (HOSPITAL_COMMUNITY)

## 2024-04-09 ENCOUNTER — Encounter (HOSPITAL_COMMUNITY)

## 2024-04-12 ENCOUNTER — Encounter (HOSPITAL_COMMUNITY)

## 2024-04-14 ENCOUNTER — Encounter (HOSPITAL_COMMUNITY)

## 2024-04-16 ENCOUNTER — Encounter (HOSPITAL_COMMUNITY)

## 2024-04-19 ENCOUNTER — Encounter (HOSPITAL_COMMUNITY)

## 2024-04-21 ENCOUNTER — Encounter (HOSPITAL_COMMUNITY)

## 2024-04-23 ENCOUNTER — Encounter (HOSPITAL_COMMUNITY)

## 2024-04-23 ENCOUNTER — Ambulatory Visit: Payer: Self-pay

## 2024-04-23 NOTE — Telephone Encounter (Signed)
 FYI Only or Action Required?: Action required by provider: Wants to know if he is a diabetic and wants lab work drawn  HbA1C.  Patient was last seen in primary care on 02/18/2024 by Purcell Emil Schanz, MD.  Called Nurse Triage reporting wants blood work.  Symptoms began today.  Interventions attempted: Nothing.  Symptoms are: stable.  Triage Disposition: No disposition on file.  Patient/caregiver understands and will follow disposition?:      Copied from CRM [id number]. Topic: Clinical - Red Word Triage >> Apr 23, 2024  1:10 PM Samuel Chambers ORN wrote: Red Word that prompted transfer to Nurse Triage: pt called stating he wants bloodwork done to see if he is diabetic. He recently had heart surgery and previous symptoms have gotten worse. He is hot /sweaty all the time, always extremely hungry and pain everywhere, shortness of breath

## 2024-04-23 NOTE — Addendum Note (Signed)
 Encounter addended by: Valere Arnoldo HERO on: 04/23/2024 7:45 AM  Actions taken: Flowsheet data copied forward, Flowsheet accepted

## 2024-04-26 ENCOUNTER — Encounter (HOSPITAL_COMMUNITY)

## 2024-04-28 ENCOUNTER — Encounter (HOSPITAL_COMMUNITY)

## 2024-04-30 ENCOUNTER — Encounter (HOSPITAL_COMMUNITY)

## 2024-05-03 ENCOUNTER — Encounter (HOSPITAL_COMMUNITY): Payer: Self-pay | Admitting: *Deleted

## 2024-05-03 ENCOUNTER — Encounter (HOSPITAL_COMMUNITY)

## 2024-05-03 DIAGNOSIS — Z951 Presence of aortocoronary bypass graft: Secondary | ICD-10-CM

## 2024-05-03 NOTE — Progress Notes (Signed)
 Ubaldo attended 3 exercise and 1 education class between 03/11/24- 03/19/24. Ubaldo stopped participation in cardiac rehab due to chronic pain.Hadassah Elpidio Quan RN BSN

## 2024-05-05 ENCOUNTER — Encounter (HOSPITAL_COMMUNITY)

## 2024-05-07 ENCOUNTER — Encounter (HOSPITAL_COMMUNITY)

## 2024-05-10 ENCOUNTER — Encounter (HOSPITAL_COMMUNITY)

## 2024-05-12 ENCOUNTER — Encounter (HOSPITAL_COMMUNITY)

## 2024-05-14 ENCOUNTER — Other Ambulatory Visit: Payer: Self-pay | Admitting: Emergency Medicine

## 2024-05-14 ENCOUNTER — Encounter (HOSPITAL_COMMUNITY)

## 2024-05-14 DIAGNOSIS — I10 Essential (primary) hypertension: Secondary | ICD-10-CM

## 2024-05-17 ENCOUNTER — Encounter (HOSPITAL_COMMUNITY)

## 2024-05-19 ENCOUNTER — Encounter (HOSPITAL_COMMUNITY)

## 2024-05-21 ENCOUNTER — Encounter (HOSPITAL_COMMUNITY)

## 2024-05-24 ENCOUNTER — Encounter (HOSPITAL_COMMUNITY)

## 2024-05-26 ENCOUNTER — Encounter (HOSPITAL_COMMUNITY)

## 2024-05-28 ENCOUNTER — Encounter (HOSPITAL_COMMUNITY)

## 2024-05-31 ENCOUNTER — Encounter (HOSPITAL_COMMUNITY)

## 2024-06-02 ENCOUNTER — Encounter (HOSPITAL_COMMUNITY)

## 2024-06-03 ENCOUNTER — Other Ambulatory Visit: Payer: Self-pay | Admitting: Emergency Medicine

## 2024-06-03 DIAGNOSIS — I10 Essential (primary) hypertension: Secondary | ICD-10-CM

## 2024-06-16 ENCOUNTER — Other Ambulatory Visit: Payer: Self-pay | Admitting: Urology

## 2024-06-16 DIAGNOSIS — R972 Elevated prostate specific antigen [PSA]: Secondary | ICD-10-CM

## 2024-06-21 ENCOUNTER — Encounter: Payer: Self-pay | Admitting: Emergency Medicine

## 2024-06-21 ENCOUNTER — Ambulatory Visit: Admitting: Emergency Medicine

## 2024-06-21 ENCOUNTER — Ambulatory Visit: Payer: Self-pay | Admitting: Emergency Medicine

## 2024-06-21 ENCOUNTER — Other Ambulatory Visit: Payer: Self-pay | Admitting: Emergency Medicine

## 2024-06-21 VITALS — BP 144/100 | HR 64 | Temp 97.9°F | Ht 71.5 in | Wt 276.0 lb

## 2024-06-21 DIAGNOSIS — Z9189 Other specified personal risk factors, not elsewhere classified: Secondary | ICD-10-CM

## 2024-06-21 DIAGNOSIS — I10 Essential (primary) hypertension: Secondary | ICD-10-CM

## 2024-06-21 DIAGNOSIS — T466X5A Adverse effect of antihyperlipidemic and antiarteriosclerotic drugs, initial encounter: Secondary | ICD-10-CM

## 2024-06-21 DIAGNOSIS — I25110 Atherosclerotic heart disease of native coronary artery with unstable angina pectoris: Secondary | ICD-10-CM

## 2024-06-21 DIAGNOSIS — E785 Hyperlipidemia, unspecified: Secondary | ICD-10-CM | POA: Diagnosis not present

## 2024-06-21 DIAGNOSIS — G720 Drug-induced myopathy: Secondary | ICD-10-CM

## 2024-06-21 DIAGNOSIS — Z6835 Body mass index (BMI) 35.0-35.9, adult: Secondary | ICD-10-CM | POA: Diagnosis not present

## 2024-06-21 DIAGNOSIS — R7303 Prediabetes: Secondary | ICD-10-CM

## 2024-06-21 LAB — COMPREHENSIVE METABOLIC PANEL WITH GFR
ALT: 34 U/L (ref 0–53)
AST: 33 U/L (ref 0–37)
Albumin: 4.3 g/dL (ref 3.5–5.2)
Alkaline Phosphatase: 49 U/L (ref 39–117)
BUN: 17 mg/dL (ref 6–23)
CO2: 29 meq/L (ref 19–32)
Calcium: 9.6 mg/dL (ref 8.4–10.5)
Chloride: 100 meq/L (ref 96–112)
Creatinine, Ser: 1.02 mg/dL (ref 0.40–1.50)
GFR: 73.02 mL/min (ref >60.00)
Glucose, Bld: 100 mg/dL — ABNORMAL HIGH (ref 70–99)
Potassium: 3.8 meq/L (ref 3.5–5.1)
Sodium: 138 meq/L (ref 135–145)
Total Bilirubin: 0.7 mg/dL (ref 0.2–1.2)
Total Protein: 6.9 g/dL (ref 6.0–8.3)

## 2024-06-21 LAB — LIPID PANEL
Cholesterol: 180 mg/dL (ref 0–200)
HDL: 34.1 mg/dL — ABNORMAL LOW (ref >39.00)
LDL Cholesterol: 97 mg/dL (ref 0–99)
NonHDL: 145.57
Total CHOL/HDL Ratio: 5
Triglycerides: 244 mg/dL — ABNORMAL HIGH (ref 0.0–149.0)
VLDL: 48.8 mg/dL — ABNORMAL HIGH (ref 0.0–40.0)

## 2024-06-21 LAB — CBC WITH DIFFERENTIAL/PLATELET
Basophils Absolute: 0.1 K/uL (ref 0.0–0.1)
Basophils Relative: 1 % (ref 0.0–3.0)
Eosinophils Absolute: 0.5 K/uL (ref 0.0–0.7)
Eosinophils Relative: 5 % (ref 0.0–5.0)
HCT: 41.2 % (ref 39.0–52.0)
Hemoglobin: 14.1 g/dL (ref 13.0–17.0)
Lymphocytes Relative: 28.4 % (ref 12.0–46.0)
Lymphs Abs: 2.6 K/uL (ref 0.7–4.0)
MCHC: 34.2 g/dL (ref 30.0–36.0)
MCV: 83.9 fl (ref 78.0–100.0)
Monocytes Absolute: 0.6 K/uL (ref 0.1–1.0)
Monocytes Relative: 7 % (ref 3.0–12.0)
Neutro Abs: 5.3 K/uL (ref 1.4–7.7)
Neutrophils Relative %: 58.6 % (ref 43.0–77.0)
Platelets: 240 K/uL (ref 150.0–400.0)
RBC: 4.91 Mil/uL (ref 4.22–5.81)
RDW: 16.6 % — ABNORMAL HIGH (ref 11.5–15.5)
WBC: 9.1 K/uL (ref 4.0–10.5)

## 2024-06-21 LAB — HEMOGLOBIN A1C: Hgb A1c MFr Bld: 6.5 % (ref 4.6–6.5)

## 2024-06-21 LAB — MICROALBUMIN / CREATININE URINE RATIO
Creatinine,U: 154.8 mg/dL
Microalb Creat Ratio: UNDETERMINED mg/g (ref 0.0–30.0)
Microalb, Ur: <0.7 mg/dL

## 2024-06-21 MED ORDER — WEGOVY 0.25 MG/0.5ML ~~LOC~~ SOAJ: 0.2500 mg | SUBCUTANEOUS | 3 refills | Status: DC

## 2024-06-21 NOTE — Assessment & Plan Note (Signed)
 Clinically stable.  No recent anginal episodes Status post CABG last June Scheduled to see cardiologist next month

## 2024-06-21 NOTE — Assessment & Plan Note (Signed)
 Diet and nutrition discussed History of statin myopathy

## 2024-06-21 NOTE — Telephone Encounter (Signed)
 Find out about that.  For Ozempic you need to be diabetic.  Call pharmacy.

## 2024-06-21 NOTE — Assessment & Plan Note (Signed)
 BP Readings from Last 3 Encounters:  06/21/24 (!) 144/100  03/25/24 134/78  03/11/24 (!) 160/88  Elevated blood pressure reading in the office Advised to monitor blood pressure readings at home daily for the next couple of weeks and contact the office if numbers persistently abnormal Continue hydrochlorothiazide  25 mg daily, losartan  100 mg daily, metoprolol  succinate 50 mg daily.

## 2024-06-21 NOTE — Progress Notes (Signed)
 Samuel Chambers 73 y.o.   Chief Complaint  Patient presents with   Follow-up    Pt wants to discuss medication for is prediabetes and weigh loss and patient is also having a prostate biopsy coming up ands pt PSA WAS 8.6    HISTORY OF PRESENT ILLNESS: This is a 73 y.o. male here for follow-up of chronic medical conditions #1 history of prediabetes.  Interested in GLP-1 agonist.  Wants hemoglobin A1c tested #2 obesity.  Interested in GLP-1 agonists #3 elevated PSA.  Scheduled for prostate biopsy next January #4 history of coronary artery disease status post CABG last June No other complaints or medical concerns today.  HPI   Prior to Admission medications   Medication Sig Start Date End Date Taking? Authorizing Provider  semaglutide -weight management (WEGOVY ) 0.25 MG/0.5ML SOAJ SQ injection Inject 0.25 mg into the skin once a week. Increase dose to 0.5 mg weekly after 3 to 4 weeks if side effects tolerated 06/21/24  Yes Nanda Bittick, Emil Schanz, MD  acetaminophen  (TYLENOL ) 500 MG tablet Take 1,000 mg by mouth every 6 (six) hours as needed for moderate pain (pain score 4-6).    [provider]  aspirin  EC 325 MG tablet Take 1 tablet (325 mg total) by mouth daily. 01/14/24   Roddenberry, Myron G, PA-C  benzonatate  (TESSALON ) 200 MG capsule Take 1 capsule (200 mg total) by mouth 2 (two) times daily as needed for cough. Patient not taking: Reported on 03/25/2024 02/18/24   Purcell Emil Schanz, MD  DULoxetine  (CYMBALTA ) 60 MG capsule TAKE 1 CAPSULE BY MOUTH EVERY DAY 02/13/24   Makaley Storts Jose, MD  hydrochlorothiazide  (HYDRODIURIL ) 25 MG tablet TAKE 1 TABLET (25 MG TOTAL) BY MOUTH DAILY. 05/14/24   Purcell Emil Schanz, MD  HYDROcodone  bit-homatropine [facility]) 5-1.5 MG/5ML syrup Take 5 mLs by mouth every 6 (six) hours as needed for cough. 02/18/24   Arpi Diebold Jose, MD  losartan  (COZAAR ) 100 MG tablet TAKE 1 TABLET BY MOUTH EVERY DAY 06/03/24   Ashey Tramontana Jose, MD   metoprolol  succinate (TOPROL -XL) 50 MG 24 hr tablet TAKE 1 TABLET BY MOUTH EVERY DAY 05/14/24   Purcell Emil Schanz, MD    Allergies  Allergen Reactions   Amlodipine  Swelling    Patient had severe swelling   Crestor [Rosuvastatin] Other (See Comments)    myalgias   Silodosin Palpitations   Valsartan  Other (See Comments)    Made patient feel tired and no energy   Warfarin And Related Palpitations    Fast heart beat, went down hill, felt like he was dying    Patient Active Problem List   Diagnosis Date Noted   S/P CABG x 3 01/09/2024   Coronary artery disease involving native coronary artery of native heart with unstable angina pectoris (HCC) 01/06/2024   Wheezing 07/08/2023   History of TIA (transient ischemic attack) 05/26/2023   Statin myopathy 03/11/2023   Prediabetes 08/15/2022   Arthritis of hand 07/27/2022   Acquired trigger finger of left ring finger 07/27/2022   History of gout 02/07/2021   Primary osteoarthritis of right knee 01/24/2020   OA (osteoarthritis) of hip 03/16/2018   Severe obesity (BMI 35.0-35.9 with comorbidity) (HCC) 08/04/2014   Dyslipidemia 04/29/2013   CAD S/P percutaneous coronary angioplasty    S/P laparoscopic cholecystectomy July 2014 02/18/2013   OA (osteoarthritis) of knee 09/04/2012   Situational anxiety 02/24/2007   Essential hypertension 02/24/2007   GERD 02/24/2007   Osteoarthritis of multiple joints 02/24/2007    Past Medical  History:  Diagnosis Date   Adenomatous colon polyp 03/2005   Anxiety    CAD S/P percutaneous coronary angioplasty 2008   PCI to AVG Cx with a Promus DES 2.5 mm x 8 mm   Cataract    BILATERAL,REMOVED   Degenerative joint disease    Diabetes mellitus without complication (HCC)    diet controlled,PT.DENIES UPDATED 04/10/22   Diverticulosis    Fatty liver 2016   pt denies   Fibromyalgia    GERD (gastroesophageal reflux disease)    Heart murmur    Phreesia 07/29/2020   Hyperlipidemia    statin  intolerant   Hypertension    Internal hemorrhoids    Obesity, Class II, BMI 35-39.9    Pneumonia 2016   PONV (postoperative nausea and vomiting)     Past Surgical History:  Procedure Laterality Date   CHOLECYSTECTOMY N/A 02/18/2013   Procedure: LAPAROSCOPIC CHOLECYSTECTOMY WITH INTRAOPERATIVE CHOLANGIOGRAM;  Surgeon: Donnice KATHEE Lunger, MD;  Location: [facility];  Service: General;  Laterality: N/A;   COLONOSCOPY     COLONOSCOPY  05/27/2022   CORONARY ANGIOPLASTY WITH STENT PLACEMENT  04/30/2007   AVG Cx 95% -> DES PCI with Promus DES 2.5 mm x 8 mm;had 60-70% lesion to RCA   CORONARY ARTERY BYPASS GRAFT N/A 01/09/2024   Procedure: CORONARY ARTERY BYPASS GRAFTING (CABG) TIMES THREE UTILIZING LEFT INTERNAL MAMMARY ARTERY, ENDOSCOPIC VEIN HARVEST RIGHT GREATER SAPHENOUS VEIN;  Surgeon: Kerrin Elspeth BROCKS, MD;  Location: [facility] OR;  Service: Open Heart Surgery;  Laterality: N/A;   DOPPLER ECHOCARDIOGRAPHY  05/27/2002   [facility].-normal EF 55-66%,   FOOT SURGERY Right    x2 fascia   HEEL SPUR SURGERY Right    HEMORRHOID SURGERY     Holter Monitor  04/07/2007   sinus tachy.;   JOINT REPLACEMENT N/A    Phreesia 07/29/2020   KNEE ARTHROSCOPY  03/27/2012   Procedure: ARTHROSCOPY KNEE;  Surgeon: Dempsey LULLA Moan, MD;  Location: [facility];  Service: Orthopedics;  Laterality: Left;   NM MYOCAR PERF WALL MOTION  12/19/2011   EXERCISED FOR 8-1/2 MINUTES RECHING 10 METABOLIC EQUIVALENTS. NO EVIDENCE  OF ISCHEMIA OR INFARCTION . EF 71%   POLYPECTOMY     renal dopplers  04/08/2007   relatively normal   RIGHT/LEFT HEART CATH AND CORONARY ANGIOGRAPHY N/A 01/06/2024   Procedure: RIGHT/LEFT HEART CATH AND CORONARY ANGIOGRAPHY;  Surgeon: Anner Alm ORN, MD;  Location: [facility] INVASIVE CV LAB;  Service: Cardiovascular;  Laterality: N/A;   TEE WITHOUT CARDIOVERSION N/A 01/09/2024   Procedure: ECHOCARDIOGRAM, TRANSESOPHAGEAL;  Surgeon: Kerrin Elspeth BROCKS, MD;  Location: [facility] OR;  Service: Open Heart Surgery;   Laterality: N/A;   TOTAL HIP ARTHROPLASTY Right 03/16/2018   Procedure: RIGHT TOTAL HIP ARTHROPLASTY ANTERIOR APPROACH;  Surgeon: Moan Dempsey, MD;  Location: [facility];  Service: Orthopedics;  Laterality: Right;   TOTAL HIP ARTHROPLASTY Left about 10 to 12 years ago   TOTAL KNEE ARTHROPLASTY Left 09/04/2012   Procedure: TOTAL KNEE ARTHROPLASTY;  Surgeon: Dempsey LULLA Moan, MD;  Location: [facility];  Service: Orthopedics;  Laterality: Left;   TOTAL KNEE ARTHROPLASTY Right 01/24/2020   Procedure: TOTAL KNEE ARTHROPLASTY;  Surgeon: Moan Dempsey, MD;  Location: [facility];  Service: Orthopedics;  Laterality: Right;     Social History   Socioeconomic History   Marital status: Married    Spouse name: Macario   Number of children: 1   Years of education: Not on file   Highest education level: 12th grade  Occupational  History    Employer: [employer]   Occupation: RETIRED  Tobacco Use   Smoking status: Never    Passive exposure: Past (PARENTS SMOKED)   Smokeless tobacco: Never  Vaping Use   Vaping status: Never Used  Substance and Sexual Activity   Alcohol use: No   Drug use: No   Sexual activity: Not on file  Other Topics Concern   Not on file  Social History Narrative   Very little 0-2 drinks a week      Lives with wife and 2 dogs   Social Drivers of Corporate Investment Banker Strain: Low Risk  (06/15/2024)   Overall Financial Resource Strain (CARDIA)    Difficulty of Paying Living Expenses: Not hard at all  Food Insecurity: No Food Insecurity (06/15/2024)   Hunger Vital Sign    Worried About Running Out of Food in the Last Year: Never true    Ran Out of Food in the Last Year: Never true  Transportation Needs: No Transportation Needs (06/15/2024)   PRAPARE - Administrator, Civil Service (Medical): No    Lack of Transportation (Non-Medical): No  Physical Activity: Insufficiently Active (06/15/2024)   Exercise Vital Sign    Days of Exercise per Week: 2 days     Minutes of Exercise per Session: 10 min  Stress: No Stress Concern Present (06/15/2024)   [employer] of Occupational Health - Occupational Stress Questionnaire    Feeling of Stress: Only a little  Social Connections: Socially Integrated (06/15/2024)   Social Connection and Isolation Panel    Frequency of Communication with Friends and Family: More than three times a week    Frequency of Social Gatherings with Friends and Family: More than three times a week    Attends Religious Services: More than 4 times per year    Active Member of [employer] or Organizations: Yes    Attends Engineer, Structural: More than 4 times per year    Marital Status: Married  Catering Manager Violence: Not At Risk (01/15/2024)   Humiliation, Afraid, Rape, and Kick questionnaire    Fear of Current or Ex-Partner: No    Emotionally Abused: No    Physically Abused: No    Sexually Abused: No    Family History  Problem Relation Age of Onset   Breast cancer Mother    Diabetes Mother    Prostate cancer Father    Colon cancer Neg Hx    Colon polyps Neg Hx    Esophageal cancer Neg Hx    Rectal cancer Neg Hx    Stomach cancer Neg Hx    Ulcerative colitis Neg Hx    Crohn's disease Neg Hx      Review of Systems  Constitutional: Negative.  Negative for chills and fever.  HENT: Negative.  Negative for congestion and sore throat.   Respiratory: Negative.  Negative for cough and shortness of breath.   Cardiovascular: Negative.  Negative for chest pain and palpitations.  Gastrointestinal:  Negative for abdominal pain, diarrhea, nausea and vomiting.  Genitourinary: Negative.  Negative for dysuria and hematuria.  Skin: Negative.  Negative for rash.  Neurological: Negative.  Negative for dizziness and headaches.  All other systems reviewed and are negative.   Vitals:   06/21/24 0935  BP: (!) 144/100  Pulse: 64  Temp: 97.9 F (36.6 C)  SpO2: 98%    Physical Exam Vitals reviewed.   Constitutional:      Appearance: Normal appearance. He is  obese.  HENT:     Head: Normocephalic.     Mouth/Throat:     Mouth: Mucous membranes are moist.     Pharynx: Oropharynx is clear.  Eyes:     Extraocular Movements: Extraocular movements intact.     Pupils: Pupils are equal, round, and reactive to light.  Cardiovascular:     Rate and Rhythm: Normal rate and regular rhythm.     Pulses: Normal pulses.     Heart sounds: Normal heart sounds.  Pulmonary:     Effort: Pulmonary effort is normal.     Breath sounds: Normal breath sounds.  Abdominal:     Palpations: Abdomen is soft.     Tenderness: There is no abdominal tenderness.  Musculoskeletal:     Cervical back: No tenderness.  Lymphadenopathy:     Cervical: No cervical adenopathy.  Skin:    General: Skin is warm and dry.     Capillary Refill: Capillary refill takes less than 2 seconds.  Neurological:     General: No focal deficit present.     Mental Status: He is alert and oriented to person, place, and time.  Psychiatric:        Mood and Affect: Mood normal.        Behavior: Behavior normal.      ASSESSMENT & PLAN: A total of 40 minutes was spent with the patient and counseling/coordination of care regarding preparing for this visit, review of most recent office visit notes, review of multiple chronic medical conditions and their management, review of all medications and changes made, review of most recent bloodwork results, review of health maintenance items, education on nutrition, prognosis, documentation, and need for follow up.   Problem List Items Addressed This Visit       Cardiovascular and Mediastinum   Essential hypertension - Primary (Chronic)   BP Readings from Last 3 Encounters:  06/21/24 (!) 144/100  03/25/24 134/78  03/11/24 (!) 160/88  Elevated blood pressure reading in the office Advised to monitor blood pressure readings at home daily for the next couple of weeks and contact the office if  numbers persistently abnormal Continue hydrochlorothiazide  25 mg daily, losartan  100 mg daily, metoprolol  succinate 50 mg daily.       Relevant Orders   CBC with Differential/Platelet   Comprehensive metabolic panel with GFR   Coronary artery disease involving native coronary artery of native heart with unstable angina pectoris (HCC)   Clinically stable.  No recent anginal episodes Status post CABG last June Scheduled to see cardiologist next month        Musculoskeletal and Integument   Statin myopathy     Other   Dyslipidemia   Diet and nutrition discussed History of statin myopathy      Relevant Orders   Comprehensive metabolic panel with GFR   Lipid panel   Severe obesity (BMI 35.0-35.9 with comorbidity) (HCC)   Diet and nutrition discussed Advised to decrease amount of daily carbohydrate intake and daily calories and increase amount of plant-based protein in his diet Benefits of exercise discussed Will benefit from GLP-1 agonist Wegovy  prescription sent today      Relevant Medications   semaglutide -weight management (WEGOVY ) 0.25 MG/0.5ML SOAJ SQ injection   Other Relevant Orders   CBC with Differential/Platelet   Comprehensive metabolic panel with GFR   Hemoglobin A1c   Lipid panel   Microalbumin / creatinine urine ratio   Prediabetes   Diet and nutrition discussed Hemoglobin A1c repeated today Cardiovascular risks associated  with diabetes discussed Will benefit from GLP-1 agonist      Relevant Orders   Hemoglobin A1c   Microalbumin / creatinine urine ratio   At risk for cardiovascular event   Relevant Medications   semaglutide -weight management (WEGOVY ) 0.25 MG/0.5ML SOAJ SQ injection   Patient Instructions  Health Maintenance After Age 8 After age 57, you are at a higher risk for certain long-term diseases and infections as well as injuries from falls. Falls are a major cause of broken bones and head injuries in people who are older than age 35.  Getting regular preventive care can help to keep you healthy and well. Preventive care includes getting regular testing and making lifestyle changes as recommended by your health care provider. Talk with your health care provider about: Which screenings and tests you should have. A screening is a test that checks for a disease when you have no symptoms. A diet and exercise plan that is right for you. What should I know about screenings and tests to prevent falls? Screening and testing are the best ways to find a health problem early. Early diagnosis and treatment give you the best chance of managing medical conditions that are common after age 27. Certain conditions and lifestyle choices may make you more likely to have a fall. Your health care provider may recommend: Regular vision checks. Poor vision and conditions such as cataracts can make you more likely to have a fall. If you wear glasses, make sure to get your prescription updated if your vision changes. Medicine review. Work with your health care provider to regularly review all of the medicines you are taking, including over-the-counter medicines. Ask your health care provider about any side effects that may make you more likely to have a fall. Tell your health care provider if any medicines that you take make you feel dizzy or sleepy. Strength and balance checks. Your health care provider may recommend certain tests to check your strength and balance while standing, walking, or changing positions. Foot health exam. Foot pain and numbness, as well as not wearing proper footwear, can make you more likely to have a fall. Screenings, including: Osteoporosis screening. Osteoporosis is a condition that causes the bones to get weaker and break more easily. Blood pressure screening. Blood pressure changes and medicines to control blood pressure can make you feel dizzy. Depression screening. You may be more likely to have a fall if you have a fear of  falling, feel depressed, or feel unable to do activities that you used to do. Alcohol use screening. Using too much alcohol can affect your balance and may make you more likely to have a fall. Follow these instructions at home: Lifestyle Do not drink alcohol if: Your health care provider tells you not to drink. If you drink alcohol: Limit how much you have to: 0-1 drink a day for women. 0-2 drinks a day for men. Know how much alcohol is in your drink. In the [place] one drink equals one 12 oz bottle of beer (355 mL), one 5 oz glass of wine (148 mL), or one 1 oz glass of hard liquor (44 mL). Do not use any products that contain nicotine or tobacco. These products include cigarettes, chewing tobacco, and vaping devices, such as e-cigarettes. If you need help quitting, ask your health care provider. Activity  Follow a regular exercise program to stay fit. This will help you maintain your balance. Ask your health care provider what types of exercise are appropriate for you.  If you need a cane or walker, use it as recommended by your health care provider. Wear supportive shoes that have nonskid soles. Safety  Remove any tripping hazards, such as rugs, cords, and clutter. Install safety equipment such as grab bars in bathrooms and safety rails on stairs. Keep rooms and walkways well-lit. General instructions Talk with your health care provider about your risks for falling. Tell your health care provider if: You fall. Be sure to tell your health care provider about all falls, even ones that seem minor. You feel dizzy, tiredness (fatigue), or off-balance. Take over-the-counter and prescription medicines only as told by your health care provider. These include supplements. Eat a healthy diet and maintain a healthy weight. A healthy diet includes low-fat dairy products, low-fat (lean) meats, and fiber from whole grains, beans, and lots of fruits and vegetables. Stay current with your  vaccines. Schedule regular health, dental, and eye exams. Summary Having a healthy lifestyle and getting preventive care can help to protect your health and wellness after age 72. Screening and testing are the best way to find a health problem early and help you avoid having a fall. Early diagnosis and treatment give you the best chance for managing medical conditions that are more common for people who are older than age 29. Falls are a major cause of broken bones and head injuries in people who are older than age 74. Take precautions to prevent a fall at home. Work with your health care provider to learn what changes you can make to improve your health and wellness and to prevent falls. This information is not intended to replace advice given to you by your health care provider. Make sure you discuss any questions you have with your health care provider. Document Revised: 12/04/2020 Document Reviewed: 12/04/2020 Elsevier Patient Education  2024 Elsevier Inc.     Emil Schaumann, MD [facility]

## 2024-06-21 NOTE — Patient Instructions (Signed)
 Health Maintenance After Age 73 After age 27, you are at a higher risk for certain long-term diseases and infections as well as injuries from falls. Falls are a major cause of broken bones and head injuries in people who are older than age 73. Getting regular preventive care can help to keep you healthy and well. Preventive care includes getting regular testing and making lifestyle changes as recommended by your health care provider. Talk with your health care provider about: Which screenings and tests you should have. A screening is a test that checks for a disease when you have no symptoms. A diet and exercise plan that is right for you. What should I know about screenings and tests to prevent falls? Screening and testing are the best ways to find a health problem early. Early diagnosis and treatment give you the best chance of managing medical conditions that are common after age 90. Certain conditions and lifestyle choices may make you more likely to have a fall. Your health care provider may recommend: Regular vision checks. Poor vision and conditions such as cataracts can make you more likely to have a fall. If you wear glasses, make sure to get your prescription updated if your vision changes. Medicine review. Work with your health care provider to regularly review all of the medicines you are taking, including over-the-counter medicines. Ask your health care provider about any side effects that may make you more likely to have a fall. Tell your health care provider if any medicines that you take make you feel dizzy or sleepy. Strength and balance checks. Your health care provider may recommend certain tests to check your strength and balance while standing, walking, or changing positions. Foot health exam. Foot pain and numbness, as well as not wearing proper footwear, can make you more likely to have a fall. Screenings, including: Osteoporosis screening. Osteoporosis is a condition that causes  the bones to get weaker and break more easily. Blood pressure screening. Blood pressure changes and medicines to control blood pressure can make you feel dizzy. Depression screening. You may be more likely to have a fall if you have a fear of falling, feel depressed, or feel unable to do activities that you used to do. Alcohol  use screening. Using too much alcohol  can affect your balance and may make you more likely to have a fall. Follow these instructions at home: Lifestyle Do not drink alcohol  if: Your health care provider tells you not to drink. If you drink alcohol : Limit how much you have to: 0-1 drink a day for women. 0-2 drinks a day for men. Know how much alcohol  is in your drink. In the [place] one drink equals one 12 oz bottle of beer (355 mL), one 5 oz glass of wine (148 mL), or one 1 oz glass of hard liquor (44 mL). Do not use any products that contain nicotine or tobacco. These products include cigarettes, chewing tobacco, and vaping devices, such as e-cigarettes. If you need help quitting, ask your health care provider. Activity  Follow a regular exercise program to stay fit. This will help you maintain your balance. Ask your health care provider what types of exercise are appropriate for you. If you need a cane or walker, use it as recommended by your health care provider. Wear supportive shoes that have nonskid soles. Safety  Remove any tripping hazards, such as rugs, cords, and clutter. Install safety equipment such as grab bars in bathrooms and safety rails on stairs. Keep rooms and walkways  well-lit. General instructions Talk with your health care provider about your risks for falling. Tell your health care provider if: You fall. Be sure to tell your health care provider about all falls, even ones that seem minor. You feel dizzy, tiredness (fatigue), or off-balance. Take over-the-counter and prescription medicines only as told by your health care provider. These include  supplements. Eat a healthy diet and maintain a healthy weight. A healthy diet includes low-fat dairy products, low-fat (lean) meats, and fiber from whole grains, beans, and lots of fruits and vegetables. Stay current with your vaccines. Schedule regular health, dental, and eye exams. Summary Having a healthy lifestyle and getting preventive care can help to protect your health and wellness after age 15. Screening and testing are the best way to find a health problem early and help you avoid having a fall. Early diagnosis and treatment give you the best chance for managing medical conditions that are more common for people who are older than age 42. Falls are a major cause of broken bones and head injuries in people who are older than age 64. Take precautions to prevent a fall at home. Work with your health care provider to learn what changes you can make to improve your health and wellness and to prevent falls. This information is not intended to replace advice given to you by your health care provider. Make sure you discuss any questions you have with your health care provider. Document Revised: 12/04/2020 Document Reviewed: 12/04/2020 Elsevier Patient Education  2024 [employer].

## 2024-06-21 NOTE — Assessment & Plan Note (Signed)
 Diet and nutrition discussed Advised to decrease amount of daily carbohydrate intake and daily calories and increase amount of plant-based protein in his diet Benefits of exercise discussed Will benefit from GLP-1 agonist Wegovy  prescription sent today

## 2024-06-21 NOTE — Assessment & Plan Note (Signed)
 Diet and nutrition discussed Hemoglobin A1c repeated today Cardiovascular risks associated with diabetes discussed Will benefit from GLP-1 agonist

## 2024-06-23 ENCOUNTER — Other Ambulatory Visit (HOSPITAL_COMMUNITY): Payer: Self-pay

## 2024-06-23 ENCOUNTER — Other Ambulatory Visit: Payer: Self-pay | Admitting: Emergency Medicine

## 2024-06-23 ENCOUNTER — Other Ambulatory Visit: Payer: Self-pay

## 2024-06-23 ENCOUNTER — Other Ambulatory Visit (HOSPITAL_COMMUNITY): Payer: Self-pay | Admitting: Urology

## 2024-06-23 DIAGNOSIS — I152 Hypertension secondary to endocrine disorders: Secondary | ICD-10-CM

## 2024-06-23 DIAGNOSIS — Z9189 Other specified personal risk factors, not elsewhere classified: Secondary | ICD-10-CM

## 2024-06-23 DIAGNOSIS — R972 Elevated prostate specific antigen [PSA]: Secondary | ICD-10-CM

## 2024-06-23 MED ORDER — TIRZEPATIDE 2.5 MG/0.5ML ~~LOC~~ SOAJ: 2.5000 mg | SUBCUTANEOUS | 3 refills | Status: DC

## 2024-06-23 MED ORDER — ZEPBOUND 2.5 MG/0.5ML ~~LOC~~ SOAJ
2.5000 mg | SUBCUTANEOUS | 0 refills | Status: DC
Start: 2024-06-23 — End: 2024-06-23

## 2024-06-23 NOTE — Telephone Encounter (Signed)
 His last hemoglobin A1c came back at 6.5 which is in diabetic range.  He qualifies for any GLP-1 agonists with diagnosis of diabetes.  New prescription for Mounjaro  sent to pharmacy of record but it will need prior authorization.

## 2024-06-23 NOTE — Telephone Encounter (Signed)
 Patient has not been diagnosed with diabetes so this want be able to be sent in

## 2024-06-23 NOTE — Telephone Encounter (Signed)
 Okay to send it in. Thanks.

## 2024-06-23 NOTE — Telephone Encounter (Signed)
 Wegovy

## 2024-06-28 ENCOUNTER — Other Ambulatory Visit (HOSPITAL_COMMUNITY): Payer: Self-pay

## 2024-06-28 ENCOUNTER — Telehealth: Payer: Self-pay

## 2024-06-28 NOTE — Telephone Encounter (Signed)
 Pharmacy Patient Advocate Encounter   Received notification from Patient Advice Request messages that prior authorization for Mounjaro  2.5mg /0.70ml is required/requested.   Insurance verification completed.   The patient is insured through [employer]   Per test claim: The current 28 day co-pay is, $606.86.  No PA needed at this time. This test claim was processed through [facility]- copay amounts may vary at other pharmacies due to pharmacy/plan contracts, or as the patient moves through the different stages of their insurance plan.     There is $487.38 that is added to the copay for the deductible.

## 2024-07-05 ENCOUNTER — Ambulatory Visit (HOSPITAL_COMMUNITY): Admission: RE | Admit: 2024-07-05 | Discharge: 2024-07-05 | Attending: Urology

## 2024-07-05 DIAGNOSIS — R972 Elevated prostate specific antigen [PSA]: Secondary | ICD-10-CM

## 2024-07-05 MED ORDER — GADOBUTROL 1 MMOL/ML IV SOLN
10.0000 mL | Freq: Once | INTRAVENOUS | Status: AC | PRN
  Administered 2024-07-05: 10 mL via INTRAVENOUS

## 2024-08-03 ENCOUNTER — Other Ambulatory Visit (HOSPITAL_COMMUNITY): Payer: Self-pay

## 2024-08-03 ENCOUNTER — Telehealth: Payer: Self-pay | Admitting: Pharmacy Technician

## 2024-08-03 NOTE — Telephone Encounter (Signed)
 PA request has been Received. New Encounter has been or will be created for follow up. For additional info see Pharmacy Prior Auth telephone encounter from 08/03/24.

## 2024-08-03 NOTE — Telephone Encounter (Signed)
 Pharmacy Patient Advocate Encounter   Received notification from Patient Advice Request messages that prior authorization for Mounjaro  is required/requested.   Insurance verification completed.   The patient is insured through [employer]   Per test claim: The current 28 day co-pay is, $626.  No PA needed at this time. This test claim was processed through [facility]- copay amounts may vary at other pharmacies due to pharmacy/plan contracts, or as the patient moves through the different stages of their insurance plan.    Part of this is due to his deductible. - He may want to Check with who signed him up for his Part D and see if there is a better plan that covers his Medications for Diabetes. I think they can still change plans within the first 3 months. (Not 100% sure about that)

## 2024-08-04 ENCOUNTER — Telehealth: Payer: Self-pay | Admitting: Cardiovascular Disease

## 2024-08-04 ENCOUNTER — Encounter: Payer: Self-pay | Admitting: Cardiovascular Disease

## 2024-08-04 NOTE — Telephone Encounter (Signed)
 Pt scheduled f/u appt with K. West but was concerned that he has not seen Dr. Francyne since his open heart surgery. He asked if he should have this f/u with him. Please advise.

## 2024-08-04 NOTE — Telephone Encounter (Signed)
 Please advise if you prefer to see the patient.

## 2024-08-04 NOTE — Telephone Encounter (Signed)
 I would love to see him in person.  Just not sure when my next available appointment is.  If he is Okay to wait, please schedule him with me first available.

## 2024-08-05 NOTE — Telephone Encounter (Signed)
 Spoke with patient regarding f/u appt. Pt states he is ok with seeing and APP but wanted to make sure he didn't need to see Dr C. Added patient to waitlist in case there are any cancellations before his appointment with Katlyn.

## 2024-08-09 ENCOUNTER — Other Ambulatory Visit: Payer: Self-pay

## 2024-08-09 DIAGNOSIS — E1159 Type 2 diabetes mellitus with other circulatory complications: Secondary | ICD-10-CM

## 2024-08-09 MED ORDER — TIRZEPATIDE 2.5 MG/0.5ML ~~LOC~~ SOAJ: 2.5000 mg | SUBCUTANEOUS | 3 refills | Status: AC

## 2024-08-12 ENCOUNTER — Other Ambulatory Visit (HOSPITAL_COMMUNITY): Payer: Self-pay | Admitting: Urology

## 2024-08-12 DIAGNOSIS — C61 Malignant neoplasm of prostate: Secondary | ICD-10-CM

## 2024-08-16 NOTE — Progress Notes (Signed)
 GU Location of Tumor / Histology: Prostate Ca  If Prostate Cancer, Gleason Score is (4 + 3) and PSA is (8.6 on 06/08/2024)  Samuel Chambers presented as referral from Dr. Morene MICAEL Salines [facility]) elevated PSA.  Biopsies     08/24/2024 Dr. Morene MICAEL Salines NM PET (PSMA) Skull to Mid Thigh CLINICAL DATA:  PSA:  8.4 on 06/08/2024. PER PATIENT: prostate biopsy 08/02/2024, no surgeries to prostate, no treatments.   IMPRESSION: 1. Moderately low level PSMA activity within the prostate gland, with 1 focus of mild activity in the posterior left gland appears to correspond to the site of concern on comparison MRI. 2. No PSMA avid pelvic, abdominal, or mediastinal lymph nodes. 3. No PSMA avid skeletal metastasis.   07/05/2024 Dr. Morene MICAEL Salines MR Prostate with/without Contrast CLINICAL DATA:  Elevated prostate-specific antigen (PSA) level. Biopsy 01/26/2018 showed high grade prostatic intraepithelial neoplasia (PIN) and atypical cells at the left lateral base.  IMPRESSION: 1. PI-RADS category 4 lesion in the left posterolateral peripheral zone of the mid gland with minimal extension into the apex, measuring 0.52 cc, with focal low T2 signal and early enhancement. 2. Severe artifact from bilateral hip implants limits evaluation, particularly of the left half of the prostate and diffusion-weighted sequences. 3. Benign prostatic hypertrophy with prominent median lobe protruding into the urinary bladder. 4. Small right posterior diverticulum of the urinary bladder.   Past/Anticipated interventions by urology, if any:  Dr. Morene MICAEL Salines     Past/Anticipated interventions by medical oncology, if any:  NA  Weight changes, if any:  No  IPSS:  13 SHIM:  5  Bowel/Bladder complaints, if any:  No  Nausea/Vomiting, if any:  No  Pain issues, if any:  0/10  SAFETY ISSUES: Prior radiation? No Pacemaker/ICD? No Possible current pregnancy?  Male Is the patient on methotrexate? No  Current Complaints / other details: None    30 minutes spent total, including time for meaningful use questions, reviewing medication, as well as spent in face-to-face time in nurse evaluation with the patient.

## 2024-08-19 MED ORDER — TIRZEPATIDE 5 MG/0.5ML ~~LOC~~ SOAJ: 5.0000 mg | SUBCUTANEOUS | 0 refills | Status: AC

## 2024-08-19 NOTE — Addendum Note (Signed)
 Addended by: Desirey Keahey S on: 08/19/2024 09:10 AM   Modules accepted: Orders

## 2024-08-23 ENCOUNTER — Ambulatory Visit: Admitting: Cardiology

## 2024-08-24 ENCOUNTER — Encounter (HOSPITAL_COMMUNITY)
Admission: RE | Admit: 2024-08-24 | Discharge: 2024-08-24 | Disposition: A | Discharge status: Home / Self Care | Source: Ambulatory Visit | Attending: Urology | Admitting: Urology

## 2024-08-24 DIAGNOSIS — C61 Malignant neoplasm of prostate: Secondary | ICD-10-CM | POA: Diagnosis present

## 2024-08-24 MED ORDER — FLOTUFOLASTAT F 18 GALLIUM 296-5846 MBQ/ML IV SOLN
8.2400 | Freq: Once | INTRAVENOUS | Status: AC
  Administered 2024-08-24: 8.24 via INTRAVENOUS
  Filled 2024-08-24: qty 9

## 2024-08-26 DIAGNOSIS — C61 Malignant neoplasm of prostate: Secondary | ICD-10-CM | POA: Insufficient documentation

## 2024-08-27 ENCOUNTER — Ambulatory Visit
Admission: RE | Admit: 2024-08-27 | Discharge: 2024-08-27 | Disposition: A | Source: Ambulatory Visit | Attending: Radiation Oncology

## 2024-08-27 ENCOUNTER — Encounter: Payer: Self-pay | Admitting: Radiation Oncology

## 2024-08-27 ENCOUNTER — Ambulatory Visit
Admission: RE | Admit: 2024-08-27 | Discharge: 2024-08-27 | Disposition: A | Source: Ambulatory Visit | Attending: Radiation Oncology | Admitting: Radiation Oncology

## 2024-08-27 VITALS — BP 158/93 | HR 87 | Temp 97.2°F | Resp 18 | Ht 71.5 in | Wt 258.8 lb

## 2024-08-27 DIAGNOSIS — C61 Malignant neoplasm of prostate: Secondary | ICD-10-CM

## 2024-08-27 HISTORY — DX: Malignant neoplasm of prostate: C61

## 2024-08-28 ENCOUNTER — Other Ambulatory Visit: Payer: Self-pay | Admitting: Emergency Medicine

## 2024-08-28 DIAGNOSIS — F411 Generalized anxiety disorder: Secondary | ICD-10-CM

## 2024-08-30 NOTE — Progress Notes (Signed)
 Introduced myself to the patient, and his wife, as the prostate nurse navigator. He is here to discuss his radiation treatment options and will proceed with 5.5 weeks of IMRT.  I provided patient with some written education and my direct contact information.  Patient knows to reach out with any questions or barriers that may arise.

## 2024-08-31 NOTE — Telephone Encounter (Signed)
 Please advise.

## 2024-08-31 NOTE — Telephone Encounter (Signed)
 We are using a GLP-1 agonist, Wegovy , for both weight loss and diabetes.  Because of diabetes insurance may want him to switch to Ozempic

## 2024-09-02 ENCOUNTER — Other Ambulatory Visit: Payer: Self-pay | Admitting: Urology

## 2024-09-02 ENCOUNTER — Telehealth: Payer: Self-pay | Admitting: Cardiovascular Disease

## 2024-09-02 ENCOUNTER — Telehealth: Payer: Self-pay

## 2024-09-02 ENCOUNTER — Other Ambulatory Visit (HOSPITAL_COMMUNITY): Payer: Self-pay

## 2024-09-02 DIAGNOSIS — C61 Malignant neoplasm of prostate: Secondary | ICD-10-CM

## 2024-09-02 NOTE — Telephone Encounter (Signed)
"  ° °  Pre-operative Risk Assessment    Patient Name: Samuel Chambers  DOB: Dec 08, 1950 MRN: [id number]   Date of last office visit: 03/25/24 Date of next office visit: 09/07/24   Request for Surgical Clearance    Procedure:  Gold seed implant and hydrogel spacer   Date of Surgery:  Clearance 09/17/24                                Surgeon:  Dr. Odis Salines Surgeon's Group or Practice Name:  [facility]  Phone number:  [phone] Fax number:  [phone]   Type of Clearance Requested:   - Medical  - Pharmacy:  Hold Aspirin  5 days    Type of Anesthesia:  MAC   Additional requests/questions:     SignedBarbee DELENA Sharps   09/02/2024, 11:16 AM   "

## 2024-09-02 NOTE — Telephone Encounter (Signed)
 Pharmacy Patient Advocate Encounter   Received notification from [facility] that prior authorization for Mounjaro  5 mg/ 0.5 ml pen injector is required/requested.   Insurance verification completed.   The patient is insured through [employer] .   Per test claim: PA required; PA submitted to above mentioned insurance via Latent Key/confirmation #/EOC [id number] Status is pending

## 2024-09-02 NOTE — Telephone Encounter (Signed)
 Will update all parties involved pt has appt 09/07/24 with Katlyn West, NP for preop clearance.

## 2024-09-02 NOTE — Telephone Encounter (Signed)
" ° °  Name: Samuel Chambers  DOB: [birth year]  MRN: [id number]  Primary Cardiologist: Jerel Balding, MD  Chart reviewed as part of pre-operative protocol coverage. Because of Samuel Chambers's past medical history and time since last visit, he will require a follow-up in-office visit in order to better assess preoperative cardiovascular risk.  Pre-op covering staff: - Please schedule appointment and call patient to inform them. If patient already had an upcoming appointment within acceptable timeframe, please add pre-op clearance to the appointment notes so provider is aware. - Please contact requesting surgeon's office via preferred method (i.e, phone, fax) to inform them of need for appointment prior to surgery.  He is on aspirin  for remote DES placed to the circumflex in 2008.  If asymptomatic at the time of office visit should be fine to hold aspirin  x 5 to 7 days prior to procedure and resume when medically safe to do so.  Samuel LOISE Fabry, PA-C  09/02/2024, 11:41 AM   "

## 2024-09-02 NOTE — Telephone Encounter (Signed)
 Monjuro

## 2024-09-03 NOTE — Telephone Encounter (Signed)
 Just spoke to the patient and we discussed about his monjuro and the issues he was dealing with at [facility]. Pt expressed to me that he want to hold off 1 more month for now to increase to the 5mg  of monjuro. I did let patient know that I went ahead asked the prior authorization team to start the PA for the team so that we can already have a head start come march. Patient agreed and verbalized his appreciation

## 2024-09-07 ENCOUNTER — Ambulatory Visit: Admitting: Cardiology

## 2024-09-17 ENCOUNTER — Ambulatory Visit (HOSPITAL_COMMUNITY): Admit: 2024-09-17 | Admitting: Urology

## 2024-09-20 ENCOUNTER — Ambulatory Visit: Admitting: Radiation Oncology

## 2024-11-19 ENCOUNTER — Ambulatory Visit
